# Patient Record
Sex: Female | Born: 1970
Health system: Southern US, Community
[De-identification: ages and names within clinical notes are randomized; demographics above are authoritative.]

## PROBLEM LIST (undated history)

## (undated) DIAGNOSIS — I2510 Atherosclerotic heart disease of native coronary artery without angina pectoris: Secondary | ICD-10-CM

## (undated) DIAGNOSIS — R112 Nausea with vomiting, unspecified: Secondary | ICD-10-CM

## (undated) DIAGNOSIS — F329 Major depressive disorder, single episode, unspecified: Secondary | ICD-10-CM

## (undated) DIAGNOSIS — G629 Polyneuropathy, unspecified: Secondary | ICD-10-CM

## (undated) DIAGNOSIS — K219 Gastro-esophageal reflux disease without esophagitis: Secondary | ICD-10-CM

## (undated) DIAGNOSIS — N879 Dysplasia of cervix uteri, unspecified: Secondary | ICD-10-CM

## (undated) DIAGNOSIS — F419 Anxiety disorder, unspecified: Secondary | ICD-10-CM

## (undated) DIAGNOSIS — E7800 Pure hypercholesterolemia, unspecified: Secondary | ICD-10-CM

## (undated) DIAGNOSIS — Z9889 Other specified postprocedural states: Secondary | ICD-10-CM

## (undated) DIAGNOSIS — I10 Essential (primary) hypertension: Secondary | ICD-10-CM

## (undated) DIAGNOSIS — G4730 Sleep apnea, unspecified: Secondary | ICD-10-CM

## (undated) DIAGNOSIS — N200 Calculus of kidney: Secondary | ICD-10-CM

## (undated) DIAGNOSIS — R0600 Dyspnea, unspecified: Secondary | ICD-10-CM

## (undated) DIAGNOSIS — M1990 Unspecified osteoarthritis, unspecified site: Secondary | ICD-10-CM

## (undated) DIAGNOSIS — Z87442 Personal history of urinary calculi: Secondary | ICD-10-CM

## (undated) DIAGNOSIS — F32A Depression, unspecified: Secondary | ICD-10-CM

## (undated) HISTORY — PX: CARDIAC CATHETERIZATION: SHX172

## (undated) HISTORY — PX: CHOLECYSTECTOMY: SHX55

## (undated) HISTORY — DX: Dyspnea, unspecified: R06.00

## (undated) HISTORY — PX: WRIST FRACTURE SURGERY: SHX121

## (undated) HISTORY — PX: INCISION AND DRAINAGE: SHX5863

## (undated) HISTORY — PX: ELBOW SURGERY: SHX618

## (undated) HISTORY — PX: WRIST SURGERY: SHX841

## (undated) HISTORY — PX: ENDOMETRIAL ABLATION: SHX621

## (undated) HISTORY — PX: ELBOW FRACTURE SURGERY: SHX616

---

## 1998-07-29 ENCOUNTER — Ambulatory Visit (HOSPITAL_COMMUNITY): Admission: RE | Admit: 1998-07-29 | Discharge: 1998-07-29 | Payer: Self-pay | Admitting: *Deleted

## 1998-08-02 ENCOUNTER — Encounter: Admission: RE | Admit: 1998-08-02 | Discharge: 1998-10-31 | Payer: Self-pay | Admitting: *Deleted

## 1998-08-22 ENCOUNTER — Inpatient Hospital Stay (HOSPITAL_COMMUNITY): Admission: AD | Admit: 1998-08-22 | Discharge: 1998-08-24 | Payer: Self-pay | Admitting: *Deleted

## 1998-10-09 ENCOUNTER — Other Ambulatory Visit: Admission: RE | Admit: 1998-10-09 | Discharge: 1998-10-09 | Payer: Self-pay | Admitting: *Deleted

## 1999-03-14 ENCOUNTER — Encounter: Payer: Self-pay | Admitting: Specialist

## 1999-03-14 ENCOUNTER — Ambulatory Visit (HOSPITAL_COMMUNITY): Admission: RE | Admit: 1999-03-14 | Discharge: 1999-03-14 | Payer: Self-pay | Admitting: Specialist

## 2003-04-26 ENCOUNTER — Encounter: Payer: Self-pay | Admitting: *Deleted

## 2003-04-26 ENCOUNTER — Inpatient Hospital Stay (HOSPITAL_COMMUNITY): Admission: EM | Admit: 2003-04-26 | Discharge: 2003-04-27 | Payer: Self-pay | Admitting: *Deleted

## 2003-04-27 ENCOUNTER — Encounter: Payer: Self-pay | Admitting: Internal Medicine

## 2003-05-09 ENCOUNTER — Encounter: Admission: RE | Admit: 2003-05-09 | Discharge: 2003-05-09 | Payer: Self-pay | Admitting: Internal Medicine

## 2003-05-15 ENCOUNTER — Encounter: Admission: RE | Admit: 2003-05-15 | Discharge: 2003-08-13 | Payer: Self-pay | Admitting: *Deleted

## 2003-05-23 ENCOUNTER — Encounter: Admission: RE | Admit: 2003-05-23 | Discharge: 2003-05-23 | Payer: Self-pay | Admitting: Internal Medicine

## 2003-06-27 ENCOUNTER — Encounter: Admission: RE | Admit: 2003-06-27 | Discharge: 2003-06-27 | Payer: Self-pay | Admitting: Internal Medicine

## 2003-08-03 ENCOUNTER — Encounter: Admission: RE | Admit: 2003-08-03 | Discharge: 2003-08-03 | Payer: Self-pay | Admitting: Internal Medicine

## 2003-09-06 ENCOUNTER — Encounter: Admission: RE | Admit: 2003-09-06 | Discharge: 2003-12-05 | Payer: Self-pay | Admitting: *Deleted

## 2003-10-22 ENCOUNTER — Encounter: Admission: RE | Admit: 2003-10-22 | Discharge: 2003-10-22 | Payer: Self-pay | Admitting: Internal Medicine

## 2003-11-05 ENCOUNTER — Encounter: Admission: RE | Admit: 2003-11-05 | Discharge: 2003-11-05 | Payer: Self-pay | Admitting: Internal Medicine

## 2003-12-22 ENCOUNTER — Emergency Department (HOSPITAL_COMMUNITY): Admission: EM | Admit: 2003-12-22 | Discharge: 2003-12-22 | Payer: Self-pay | Admitting: Emergency Medicine

## 2003-12-23 ENCOUNTER — Emergency Department (HOSPITAL_COMMUNITY): Admission: EM | Admit: 2003-12-23 | Discharge: 2003-12-23 | Payer: Self-pay | Admitting: Family Medicine

## 2003-12-27 ENCOUNTER — Encounter: Admission: RE | Admit: 2003-12-27 | Discharge: 2003-12-27 | Payer: Self-pay | Admitting: Internal Medicine

## 2004-01-03 ENCOUNTER — Encounter: Admission: RE | Admit: 2004-01-03 | Discharge: 2004-01-03 | Payer: Self-pay | Admitting: Internal Medicine

## 2004-01-07 ENCOUNTER — Ambulatory Visit (HOSPITAL_COMMUNITY): Admission: RE | Admit: 2004-01-07 | Discharge: 2004-01-07 | Payer: Self-pay | Admitting: Internal Medicine

## 2004-01-11 ENCOUNTER — Encounter: Admission: RE | Admit: 2004-01-11 | Discharge: 2004-01-11 | Payer: Self-pay | Admitting: Internal Medicine

## 2004-03-21 ENCOUNTER — Emergency Department (HOSPITAL_COMMUNITY): Admission: EM | Admit: 2004-03-21 | Discharge: 2004-03-21 | Payer: Self-pay | Admitting: Family Medicine

## 2004-03-28 ENCOUNTER — Encounter: Admission: RE | Admit: 2004-03-28 | Discharge: 2004-03-28 | Payer: Self-pay | Admitting: Internal Medicine

## 2004-04-29 ENCOUNTER — Encounter: Admission: RE | Admit: 2004-04-29 | Discharge: 2004-04-29 | Payer: Self-pay | Admitting: Internal Medicine

## 2004-07-30 ENCOUNTER — Emergency Department (HOSPITAL_COMMUNITY): Admission: EM | Admit: 2004-07-30 | Discharge: 2004-07-30 | Payer: Self-pay | Admitting: Family Medicine

## 2004-09-22 ENCOUNTER — Ambulatory Visit: Payer: Self-pay | Admitting: Internal Medicine

## 2004-09-24 ENCOUNTER — Ambulatory Visit: Payer: Self-pay | Admitting: Internal Medicine

## 2004-09-24 ENCOUNTER — Ambulatory Visit (HOSPITAL_BASED_OUTPATIENT_CLINIC_OR_DEPARTMENT_OTHER): Admission: RE | Admit: 2004-09-24 | Discharge: 2004-09-24 | Payer: Self-pay | Admitting: Internal Medicine

## 2004-10-21 ENCOUNTER — Ambulatory Visit: Payer: Self-pay | Admitting: Internal Medicine

## 2004-10-22 ENCOUNTER — Ambulatory Visit: Payer: Self-pay | Admitting: Internal Medicine

## 2004-11-04 ENCOUNTER — Ambulatory Visit (HOSPITAL_COMMUNITY): Admission: RE | Admit: 2004-11-04 | Discharge: 2004-11-04 | Payer: Self-pay | Admitting: Internal Medicine

## 2004-11-06 ENCOUNTER — Emergency Department (HOSPITAL_COMMUNITY): Admission: EM | Admit: 2004-11-06 | Discharge: 2004-11-06 | Payer: Self-pay | Admitting: Family Medicine

## 2004-11-27 ENCOUNTER — Emergency Department (HOSPITAL_COMMUNITY): Admission: EM | Admit: 2004-11-27 | Discharge: 2004-11-27 | Payer: Self-pay | Admitting: Family Medicine

## 2005-01-22 ENCOUNTER — Ambulatory Visit: Payer: Self-pay | Admitting: Internal Medicine

## 2005-02-06 ENCOUNTER — Inpatient Hospital Stay (HOSPITAL_COMMUNITY): Admission: AD | Admit: 2005-02-06 | Discharge: 2005-02-06 | Payer: Self-pay | Admitting: Obstetrics & Gynecology

## 2005-04-28 ENCOUNTER — Emergency Department (HOSPITAL_COMMUNITY): Admission: EM | Admit: 2005-04-28 | Discharge: 2005-04-29 | Payer: Self-pay | Admitting: Emergency Medicine

## 2005-05-08 ENCOUNTER — Ambulatory Visit: Payer: Self-pay | Admitting: Internal Medicine

## 2005-05-11 ENCOUNTER — Emergency Department (HOSPITAL_COMMUNITY): Admission: EM | Admit: 2005-05-11 | Discharge: 2005-05-11 | Payer: Self-pay | Admitting: Emergency Medicine

## 2005-05-14 ENCOUNTER — Ambulatory Visit: Payer: Self-pay | Admitting: Internal Medicine

## 2005-05-14 ENCOUNTER — Ambulatory Visit (HOSPITAL_COMMUNITY): Admission: RE | Admit: 2005-05-14 | Discharge: 2005-05-14 | Payer: Self-pay | Admitting: Internal Medicine

## 2005-05-18 ENCOUNTER — Ambulatory Visit: Payer: Self-pay | Admitting: Internal Medicine

## 2005-05-22 ENCOUNTER — Ambulatory Visit: Payer: Self-pay | Admitting: Internal Medicine

## 2006-05-05 ENCOUNTER — Emergency Department (HOSPITAL_COMMUNITY): Admission: EM | Admit: 2006-05-05 | Discharge: 2006-05-05 | Payer: Self-pay | Admitting: Emergency Medicine

## 2006-07-27 ENCOUNTER — Ambulatory Visit: Payer: Self-pay | Admitting: Cardiology

## 2006-07-27 ENCOUNTER — Emergency Department (HOSPITAL_COMMUNITY): Admission: EM | Admit: 2006-07-27 | Discharge: 2006-07-27 | Payer: Self-pay | Admitting: Emergency Medicine

## 2006-07-29 ENCOUNTER — Inpatient Hospital Stay (HOSPITAL_BASED_OUTPATIENT_CLINIC_OR_DEPARTMENT_OTHER): Admission: RE | Admit: 2006-07-29 | Discharge: 2006-07-29 | Payer: Self-pay | Admitting: Cardiovascular Disease

## 2006-07-29 ENCOUNTER — Ambulatory Visit: Payer: Self-pay | Admitting: Cardiovascular Disease

## 2006-08-03 ENCOUNTER — Ambulatory Visit: Payer: Self-pay | Admitting: Internal Medicine

## 2006-08-03 ENCOUNTER — Ambulatory Visit: Payer: Self-pay

## 2006-08-05 ENCOUNTER — Ambulatory Visit: Payer: Self-pay

## 2006-10-01 ENCOUNTER — Ambulatory Visit (HOSPITAL_COMMUNITY): Admission: RE | Admit: 2006-10-01 | Discharge: 2006-10-01 | Payer: Self-pay | Admitting: Obstetrics and Gynecology

## 2006-10-01 ENCOUNTER — Encounter (INDEPENDENT_AMBULATORY_CARE_PROVIDER_SITE_OTHER): Payer: Self-pay | Admitting: Specialist

## 2006-11-08 ENCOUNTER — Ambulatory Visit (HOSPITAL_COMMUNITY): Admission: RE | Admit: 2006-11-08 | Discharge: 2006-11-08 | Payer: Self-pay | Admitting: Obstetrics and Gynecology

## 2007-03-10 ENCOUNTER — Ambulatory Visit (HOSPITAL_COMMUNITY): Admission: RE | Admit: 2007-03-10 | Discharge: 2007-03-10 | Payer: Self-pay | Admitting: General Surgery

## 2007-05-23 ENCOUNTER — Ambulatory Visit (HOSPITAL_COMMUNITY): Admission: RE | Admit: 2007-05-23 | Discharge: 2007-05-23 | Payer: Self-pay | Admitting: Obstetrics and Gynecology

## 2007-12-04 ENCOUNTER — Emergency Department (HOSPITAL_COMMUNITY): Admission: EM | Admit: 2007-12-04 | Discharge: 2007-12-04 | Payer: Self-pay | Admitting: Family Medicine

## 2007-12-06 ENCOUNTER — Emergency Department (HOSPITAL_COMMUNITY): Admission: EM | Admit: 2007-12-06 | Discharge: 2007-12-06 | Payer: Self-pay | Admitting: Emergency Medicine

## 2007-12-08 ENCOUNTER — Emergency Department (HOSPITAL_COMMUNITY): Admission: EM | Admit: 2007-12-08 | Discharge: 2007-12-08 | Payer: Self-pay | Admitting: Emergency Medicine

## 2008-01-26 ENCOUNTER — Emergency Department (HOSPITAL_COMMUNITY): Admission: EM | Admit: 2008-01-26 | Discharge: 2008-01-26 | Payer: Self-pay | Admitting: Emergency Medicine

## 2008-03-10 ENCOUNTER — Emergency Department (HOSPITAL_COMMUNITY): Admission: EM | Admit: 2008-03-10 | Discharge: 2008-03-10 | Payer: Self-pay | Admitting: Family Medicine

## 2008-05-19 ENCOUNTER — Emergency Department (HOSPITAL_COMMUNITY): Admission: EM | Admit: 2008-05-19 | Discharge: 2008-05-19 | Payer: Self-pay | Admitting: Family Medicine

## 2008-06-04 ENCOUNTER — Emergency Department (HOSPITAL_COMMUNITY): Admission: EM | Admit: 2008-06-04 | Discharge: 2008-06-04 | Payer: Self-pay | Admitting: Family Medicine

## 2008-12-11 ENCOUNTER — Emergency Department (HOSPITAL_COMMUNITY): Admission: EM | Admit: 2008-12-11 | Discharge: 2008-12-11 | Payer: Self-pay | Admitting: Family Medicine

## 2009-08-20 ENCOUNTER — Emergency Department (HOSPITAL_COMMUNITY): Admission: EM | Admit: 2009-08-20 | Discharge: 2009-08-20 | Payer: Self-pay | Admitting: Emergency Medicine

## 2009-08-23 ENCOUNTER — Emergency Department (HOSPITAL_COMMUNITY): Admission: EM | Admit: 2009-08-23 | Discharge: 2009-08-24 | Payer: Self-pay | Admitting: Emergency Medicine

## 2010-04-15 ENCOUNTER — Observation Stay (HOSPITAL_COMMUNITY): Admission: EM | Admit: 2010-04-15 | Discharge: 2010-04-15 | Payer: Self-pay | Admitting: Emergency Medicine

## 2010-04-15 ENCOUNTER — Emergency Department (HOSPITAL_COMMUNITY): Admission: EM | Admit: 2010-04-15 | Discharge: 2010-04-15 | Payer: Self-pay | Admitting: Family Medicine

## 2010-04-23 ENCOUNTER — Emergency Department (HOSPITAL_COMMUNITY): Admission: EM | Admit: 2010-04-23 | Discharge: 2010-04-23 | Payer: Self-pay | Admitting: Family Medicine

## 2010-05-07 ENCOUNTER — Ambulatory Visit: Payer: Self-pay | Admitting: Internal Medicine

## 2010-05-13 ENCOUNTER — Ambulatory Visit (HOSPITAL_COMMUNITY): Admission: RE | Admit: 2010-05-13 | Discharge: 2010-05-13 | Payer: Self-pay | Admitting: Family Medicine

## 2010-06-02 ENCOUNTER — Encounter (INDEPENDENT_AMBULATORY_CARE_PROVIDER_SITE_OTHER): Payer: Self-pay | Admitting: *Deleted

## 2010-06-02 LAB — CONVERTED CEMR LAB
ALT: 15 units/L (ref 0–35)
AST: 12 units/L (ref 0–37)
Albumin: 4.2 g/dL (ref 3.5–5.2)
Alkaline Phosphatase: 64 units/L (ref 39–117)
BUN: 20 mg/dL (ref 6–23)
CO2: 24 meq/L (ref 19–32)
Calcium: 9.3 mg/dL (ref 8.4–10.5)
Chloride: 104 meq/L (ref 96–112)
Cholesterol: 214 mg/dL — ABNORMAL HIGH (ref 0–200)
Creatinine, Ser: 0.99 mg/dL (ref 0.40–1.20)
Glucose, Bld: 162 mg/dL — ABNORMAL HIGH (ref 70–99)
HDL: 46 mg/dL (ref >39)
LDL Cholesterol: 126 mg/dL — ABNORMAL HIGH (ref 0–99)
Microalb, Ur: 0.96 mg/dL (ref 0.00–1.89)
Potassium: 4.7 meq/L (ref 3.5–5.3)
Sodium: 139 meq/L (ref 135–145)
Total Bilirubin: 0.3 mg/dL (ref 0.3–1.2)
Total CHOL/HDL Ratio: 4.7
Total Protein: 6.6 g/dL (ref 6.0–8.3)
Triglycerides: 208 mg/dL — ABNORMAL HIGH (ref <150)
VLDL: 42 mg/dL — ABNORMAL HIGH (ref 0–40)

## 2010-09-21 ENCOUNTER — Encounter: Payer: Self-pay | Admitting: Cardiovascular Disease

## 2010-10-28 ENCOUNTER — Inpatient Hospital Stay (HOSPITAL_COMMUNITY)
Admission: EM | Admit: 2010-10-28 | Discharge: 2010-10-31 | DRG: 313 | Disposition: A | Discharge status: Home / Self Care | Payer: Medicaid Other | Attending: Internal Medicine | Admitting: Internal Medicine

## 2010-10-28 ENCOUNTER — Emergency Department (HOSPITAL_COMMUNITY): Payer: Medicaid Other

## 2010-10-28 DIAGNOSIS — Z8249 Family history of ischemic heart disease and other diseases of the circulatory system: Secondary | ICD-10-CM

## 2010-10-28 DIAGNOSIS — E669 Obesity, unspecified: Secondary | ICD-10-CM | POA: Diagnosis present

## 2010-10-28 DIAGNOSIS — E119 Type 2 diabetes mellitus without complications: Secondary | ICD-10-CM | POA: Diagnosis present

## 2010-10-28 DIAGNOSIS — E785 Hyperlipidemia, unspecified: Secondary | ICD-10-CM | POA: Diagnosis present

## 2010-10-28 DIAGNOSIS — R0789 Other chest pain: Principal | ICD-10-CM | POA: Diagnosis present

## 2010-10-28 DIAGNOSIS — F17200 Nicotine dependence, unspecified, uncomplicated: Secondary | ICD-10-CM | POA: Diagnosis present

## 2010-10-28 DIAGNOSIS — I10 Essential (primary) hypertension: Secondary | ICD-10-CM | POA: Diagnosis present

## 2010-10-28 LAB — CBC
HCT: 43.8 % (ref 36.0–46.0)
Hemoglobin: 15 g/dL (ref 12.0–15.0)
MCH: 30 pg (ref 26.0–34.0)
MCHC: 34.2 g/dL (ref 30.0–36.0)
MCV: 87.6 fL (ref 78.0–100.0)
Platelets: 209 10*3/uL (ref 150–400)
RBC: 5 MIL/uL (ref 3.87–5.11)
RDW: 12.2 % (ref 11.5–15.5)
WBC: 13.9 10*3/uL — ABNORMAL HIGH (ref 4.0–10.5)

## 2010-10-28 LAB — GLUCOSE, CAPILLARY
Glucose-Capillary: 63 mg/dL — ABNORMAL LOW (ref 70–99)
Glucose-Capillary: 149 mg/dL — ABNORMAL HIGH (ref 70–99)

## 2010-10-28 LAB — DIFFERENTIAL
Basophils Absolute: 0 10*3/uL (ref 0.0–0.1)
Basophils Relative: 0 % (ref 0–1)
Eosinophils Absolute: 0 10*3/uL (ref 0.0–0.7)
Eosinophils Relative: 0 % (ref 0–5)
Lymphocytes Relative: 22 % (ref 12–46)
Lymphs Abs: 3.1 10*3/uL (ref 0.7–4.0)
Monocytes Absolute: 0.7 10*3/uL (ref 0.1–1.0)
Monocytes Relative: 5 % (ref 3–12)
Neutro Abs: 10 10*3/uL — ABNORMAL HIGH (ref 1.7–7.7)
Neutrophils Relative %: 72 % (ref 43–77)

## 2010-10-28 LAB — CARDIAC PANEL(CRET KIN+CKTOT+MB+TROPI)
CK, MB: 2.2 ng/mL (ref 0.3–4.0)
Relative Index: INVALID (ref 0.0–2.5)
Total CK: 79 U/L (ref 7–177)
Troponin I: 0.01 ng/mL (ref 0.00–0.06)

## 2010-10-28 LAB — COMPREHENSIVE METABOLIC PANEL
ALT: 23 U/L (ref 0–35)
AST: 19 U/L (ref 0–37)
Albumin: 3.9 g/dL (ref 3.5–5.2)
Alkaline Phosphatase: 51 U/L (ref 39–117)
BUN: 13 mg/dL (ref 6–23)
CO2: 27 mEq/L (ref 19–32)
Calcium: 9.1 mg/dL (ref 8.4–10.5)
Chloride: 106 mEq/L (ref 96–112)
Creatinine, Ser: 0.78 mg/dL (ref 0.4–1.2)
GFR calc Af Amer: >60 mL/min (ref >60)
GFR calc non Af Amer: >60 mL/min (ref >60)
Glucose, Bld: 72 mg/dL (ref 70–99)
Potassium: 3.7 mEq/L (ref 3.5–5.1)
Sodium: 140 mEq/L (ref 135–145)
Total Bilirubin: 0.4 mg/dL (ref 0.3–1.2)
Total Protein: 7.2 g/dL (ref 6.0–8.3)

## 2010-10-28 LAB — POCT CARDIAC MARKERS
CKMB, poc: 2.1 ng/mL (ref 1.0–8.0)
CKMB, poc: 2.1 ng/mL (ref 1.0–8.0)
Myoglobin, poc: 58.7 ng/mL (ref 12–200)
Myoglobin, poc: 60.1 ng/mL (ref 12–200)
Troponin i, poc: <0.05 ng/mL (ref 0.00–0.09)
Troponin i, poc: 0.06 ng/mL (ref 0.00–0.09)

## 2010-10-29 DIAGNOSIS — R079 Chest pain, unspecified: Secondary | ICD-10-CM

## 2010-10-29 LAB — GLUCOSE, CAPILLARY
Glucose-Capillary: 153 mg/dL — ABNORMAL HIGH (ref 70–99)
Glucose-Capillary: 123 mg/dL — ABNORMAL HIGH (ref 70–99)
Glucose-Capillary: 182 mg/dL — ABNORMAL HIGH (ref 70–99)

## 2010-10-29 LAB — LIPID PANEL
Cholesterol: 231 mg/dL — ABNORMAL HIGH (ref 0–200)
HDL: 37 mg/dL — ABNORMAL LOW (ref >39)
LDL Cholesterol: 125 mg/dL — ABNORMAL HIGH (ref 0–99)
Total CHOL/HDL Ratio: 6.2 RATIO
Triglycerides: 346 mg/dL — ABNORMAL HIGH (ref <150)
VLDL: 69 mg/dL — ABNORMAL HIGH (ref 0–40)

## 2010-10-29 LAB — BASIC METABOLIC PANEL
BUN: 15 mg/dL (ref 6–23)
CO2: 28 mEq/L (ref 19–32)
Calcium: 9.1 mg/dL (ref 8.4–10.5)
Chloride: 103 mEq/L (ref 96–112)
Creatinine, Ser: 0.72 mg/dL (ref 0.4–1.2)
GFR calc Af Amer: >60 mL/min (ref >60)
GFR calc non Af Amer: >60 mL/min (ref >60)
Glucose, Bld: 132 mg/dL — ABNORMAL HIGH (ref 70–99)
Potassium: 4 mEq/L (ref 3.5–5.1)
Sodium: 137 mEq/L (ref 135–145)

## 2010-10-29 LAB — CARDIAC PANEL(CRET KIN+CKTOT+MB+TROPI)
CK, MB: 2 ng/mL (ref 0.3–4.0)
CK, MB: 2 ng/mL (ref 0.3–4.0)
Relative Index: INVALID (ref 0.0–2.5)
Relative Index: INVALID (ref 0.0–2.5)
Total CK: 80 U/L (ref 7–177)
Total CK: 64 U/L (ref 7–177)
Troponin I: 0.01 ng/mL (ref 0.00–0.06)
Troponin I: 0.01 ng/mL (ref 0.00–0.06)

## 2010-10-29 LAB — CBC
HCT: 39.1 % (ref 36.0–46.0)
Hemoglobin: 13.4 g/dL (ref 12.0–15.0)
MCH: 29.7 pg (ref 26.0–34.0)
MCHC: 34.3 g/dL (ref 30.0–36.0)
MCV: 86.7 fL (ref 78.0–100.0)
Platelets: 191 10*3/uL (ref 150–400)
RBC: 4.51 MIL/uL (ref 3.87–5.11)
RDW: 11.9 % (ref 11.5–15.5)
WBC: 9.4 10*3/uL (ref 4.0–10.5)

## 2010-10-29 LAB — HEMOGLOBIN A1C
Hgb A1c MFr Bld: 6.4 % — ABNORMAL HIGH (ref <5.7)
Mean Plasma Glucose: 137 mg/dL — ABNORMAL HIGH (ref <117)

## 2010-10-29 LAB — MAGNESIUM: Magnesium: 1.7 mg/dL (ref 1.5–2.5)

## 2010-10-29 NOTE — H&P (Signed)
[name], [name] NO.:  [id number]  MEDICAL RECORD NO.:  [id number]           PATIENT TYPE:  E  LOCATION:  WLED                         FACILITY:  [facility]  PHYSICIAN:  Vania Rea, M.D. DATE OF BIRTH:  1970-11-20  DATE OF ADMISSION:  10/28/2010 DATE OF DISCHARGE:                             HISTORY & PHYSICAL   PRIMARY CARE PHYSICIAN:  [name] at [facility].  C[facility]OLOGIST[facility]or[facility]iology.  CHIEF COMPLAINT:  Chest pain since this morning.  HISTORY OF PRESENT ILLNESS:  This is a 40 year old Caucasian lady with a history of diabetes, hypertension and strong family history of early coronary artery disease, who has a history of episodic chest pains for the past few years.  The patient did have a cardiac catheterization by the [facility] in 2007 and was shown to have no evidence of obstruction; however, she did have an aberrant circumflex.  A CT of other coronary arteries was recommended, but there is no evidence that this was done.  The patient reportedly was placed on metoprolol and isosorbide.  However, after she lost her health insurance, she discontinued the use of these medications, and since she has been at [facility], she could not remember the names of the medications.  The patient has also seen [facility] in the past and says she would prefer to have her care continued by [facility] if necessary.  In any event, the patient had sudden onset of right chest pain today radiating up into the right side of her neck and down into her left arm. The pain was associated with shortness of breath, diaphoresis and dizziness, but she was able to drive herself to the emergency room where she received nitro paste, which she reports did not improve her symptoms significantly.  She reports that the pain is actually aggravated by sitting up and relieved by lying flat.  She has been having no orthopnea, no PND, no lower extremity edema and no  nausea or vomiting. She usually checks her blood sugars in the evening.  When she has episodes of low blood sugar, it usually causes nausea and dizziness. When she arrived in the emergency room, her blood sugar was checked and it was 63.  She does not think her symptoms are related to hypoglycemia. She is currently having supper.  PAST MEDICAL HISTORY: 1. Hypertension. 2. Diabetes. 3. History of recurrent chest pains and irregular heart rate.  MEDICATIONS:  Medication reconciliation has not yet been done, but includes metformin 1000 twice a day, glipizide 10 mg twice daily, lisinopril 20 mg once a day.  ALLERGIES:  CODEINE and SULFA.  SOCIAL HISTORY:  She has been a smoker for 22 years.  She used to smoke 2 packs per day.  She is now down to 1 pack per day.  Denies alcohol or illicit drug use.  FAMILY HISTORY:  Significant for a sister with coronary artery disease who had CABG at age 34.  Significant for her father who had multiple heart attacks and also had CABG.  REVIEW OF SYSTEMS:  Other than noted above, significant only for the fact that she was having stiffness  in her neck since yesterday and it has now become much worse with her current chest pain.  She says she has had her hemoglobin A1c checked a few months ago and it was about 6.5.  PAST SURGICAL HISTORY:  Includes endometrial ablation and cholecystectomy.  PHYSICAL EXAMINATION:  GENERAL:  This is a very pleasant middle-aged Caucasian lady sitting in the bed. VITAL SIGNS:  Temperature is 97.9, pulse 73, respirations 18, blood pressure 127/66.  She is saturating 100% on 2 liters. HEENT:  Her pupils are round, equal, reactive.  Mucous membranes pink. Anicteric.  No cervical lymphadenopathy or thyromegaly.  She is somewhat tender over the sternomastoid but no carotid bruit. CHEST:  Clear to auscultation bilaterally. CARDIOVASCULAR:  Regular rhythm.  No murmur. ABDOMEN:  Obese, soft, nontender. EXTREMITIES:  Without  edema.  She has 2+ dorsalis pedis pulses bilaterally. SKIN:  Warm and dry.  No ulcerations. CENTRAL NERVOUS SYSTEM:  Cranial nerves II-XII are grossly intact.  She has no focal lateralizing signs.  PERTINENT LABORATORY AND X-RAY DATA:  Her white count is elevated to 13.9.  Her hemoglobin is 15, platelets 209.  Absolute neutrophil count is elevated to 10,000.  There is no comment on a differential.  Complete metabolic panel is completely normal with sodium of 140, potassium 3.7, CO2 of 27, glucose is 72, BUN 13, creatinine 0.78.  Her calcium is 9.1. Liver functions are completely normal.  Her 2 sets of cardiac markers have been done.  On the first, her troponins were undetectable; on the second, the troponin 0.06.  CK-MB on the first was 2.1; it is unchanged on the second.  Myoglobin is completely normal at 60.  Portable chest x- ray shows no acute abnormalities and EKG shows normal sinus rhythm without any ST segment abnormalities.  ASSESSMENT: 1. Atypical chest pain in a lady with a strong family history of     cardiac disease. 2. Hypertension. 3. Diabetes type 2. 4. Obesity.  PLAN:  Will bring this lady on observation for serial cardiac enzymes and will consult her cardiologist for assistance with management.  Other plans as per orders.     Vania Rea, M.D.     LC/MEDQ  D:  10/28/2010  T:  10/28/2010  Job:  [id number]  cc:   Gay Filler  Electronically Signed by Vania Rea M.D. on 10/29/2010 04:54:09 AM

## 2010-10-30 DIAGNOSIS — R072 Precordial pain: Secondary | ICD-10-CM

## 2010-10-30 LAB — GLUCOSE, CAPILLARY
Glucose-Capillary: 150 mg/dL — ABNORMAL HIGH (ref 70–99)
Glucose-Capillary: 164 mg/dL — ABNORMAL HIGH (ref 70–99)
Glucose-Capillary: 148 mg/dL — ABNORMAL HIGH (ref 70–99)
Glucose-Capillary: 154 mg/dL — ABNORMAL HIGH (ref 70–99)
Glucose-Capillary: 133 mg/dL — ABNORMAL HIGH (ref 70–99)

## 2010-10-31 ENCOUNTER — Inpatient Hospital Stay (HOSPITAL_COMMUNITY): Payer: Medicaid Other

## 2010-10-31 LAB — GLUCOSE, CAPILLARY: Glucose-Capillary: 214 mg/dL — ABNORMAL HIGH (ref 70–99)

## 2010-10-31 MED ORDER — TECHNETIUM TC 99M TETROFOSMIN IV KIT
10.0000 | PACK | Freq: Once | INTRAVENOUS | Status: AC | PRN
  Administered 2010-10-31: 10 via INTRAVENOUS

## 2010-10-31 MED ORDER — TECHNETIUM TC 99M TETROFOSMIN IV KIT
30.0000 | PACK | Freq: Once | INTRAVENOUS | Status: AC | PRN
Start: 2010-10-31 — End: 2010-10-31
  Administered 2010-10-31: 30 via INTRAVENOUS

## 2010-11-01 ENCOUNTER — Other Ambulatory Visit (HOSPITAL_COMMUNITY): Payer: Self-pay

## 2010-11-06 NOTE — Discharge Summary (Signed)
[name], [name]              ACCOUNT NO.:  [id number]  MEDICAL RECORD NO.:  [id number]           PATIENT TYPE:  I  LOCATION:  1431                         FACILITY:  [facility]  PHYSICIAN:  [name], M.D.   DATE OF BIRTH:  1971-01-09  DATE OF ADMISSION:  10/28/2010 DATE OF DISCHARGE:  10/31/2010                              DISCHARGE SUMMARY   PRIMARY CARE PHYSICIAN:  Dr. Andrey [name] at [facility].  DISCHARGE DIAGNOSES: 1. Noncardiac chest pain. 2. Dyslipidemia. 3. Hypertension. 4. Type 2 diabetes. 5. Obesity. 6. Tobacco abuse.  DISCHARGE MEDICATIONS: 1. Aspirin 81 mg p.o. daily. 2. Flexeril 10 mg p.o. q.8 h. p.r.n. muscle pain or spasms. 3. Ibuprofen 800 mg p.o. q.8 h. p.r.n. chest pain. 4. Nicotine patch 21 mg transdermally daily for 2 weeks, then taper by     7 mg every 2 weeks until tapered off. 5. Simvastatin 20 mg p.o. daily. 6. Glipizide 10 mg p.o. b.i.d. 7. Lisinopril 20 mg p.o. daily. 8. Metformin 1000 mg p.o. b.i.d. 9. Wellbutrin 150 mg p.o. b.i.d.  CONSULTATIONS:  [name], M.D., of Cardiology.  BRIEF ADMISSION HISTORY OF PRESENT ILLNESS:  The patient is a 40 year old female with multiple cardiac risk factors, who presented to the hospital with a chief complaint of right chest wall pain.  The pain was worse with sitting up and relieved with lying flat.  Because of her risk factor profile, she was referred to the Hospitalist Service for inpatient evaluation.  For full details, please see the dictated report done by [name].  PROCEDURES AND DIAGNOSTIC STUDIES: 1. Chest x-ray on October 28, 2010, showed no active lung disease. 2. Two-dimensional echocardiogram on October 30, 2010, showed normal     systolic function with an estimated ejection fraction of 50% to     55%.  There was grade 1 diastolic dysfunction. 3. Treadmill stress test done on October 31, 2010:  Normal study.  DISCHARGE LABORATORY VALUES:  Cardiac markers were negative x3  sets. Lipids showed a cholesterol of 231, triglycerides 346, HDL 37, LDL 125. Sodium is 137, potassium 4.0, chloride 103, bicarb 28, BUN 15, creatinine 0.72, glucose 132, calcium 9.1.  White blood cell count was 9.4, hemoglobin 13.1, hematocrit 39.1, platelets 191.  Hemoglobin A1c was 6.4%.  Liver function studies were within normal limits.  HOSPITAL COURSE BY PROBLEM: 1. Noncardiac chest pain:  The patient was admitted and because of her     pain exacerbation was sitting straight upright, a 2-dimensional     echocardiogram was done to rule out pericarditis.  Because of her     risk factor profile, cardiology consultation was requested and     kindly provided by [name].  The patient did undergo a     treadmill stress test which was negative and at this point, her     chest pain appears to be musculoskeletal in origin.  She will be     treated symptomatically with Motrin and Flexeril and has been     advised to follow up with her primary care physician should her     symptoms not resolve in the next  1-2 weeks. 2. Dyslipidemia:  The patient should be on aggressive risk factor     modification given her heart disease risk factors.  She has been     put on statin therapy and has been encouraged to follow up with Dr.     Andrey [name] in 4-6 weeks for recheck of her liver function studies. 3. Hypertension:  The patient's blood pressure is well controlled on     lisinopril. 4. Type 2 diabetes:  The patient is well controlled on her outpatient     regimen. 5. Obesity:  Extensive counseling done with the patient regarding diet     and exercise. 6. Tobacco abuse:  The patient was counseled and wishes to try     Wellbutrin. The patient was also maintained on a nicotine patch.     She has been instructed to taper the patch, to get her prescription     for Wellbutrin filled and to discontinue smoking.  DISPOSITION:  The patient is medically stable and will be discharged home.  Time spent on  discharge including face-to-face time in counseling, smoking cessation equals 40 minutes.     [name], M.D.     CR/MEDQ  D:  10/31/2010  T:  10/31/2010  Job:  [id number]  cc:   Dr. Gay Filler  Electronically Signed by [name] M.D. on 11/04/2010 07:43:20 PM

## 2010-11-07 NOTE — Consult Note (Signed)
[name], Meghan NO.:  [id number]  MEDICAL RECORD NO.:  [id number]           PATIENT TYPE:  I  LOCATION:  1431                         FACILITY:  [facility]  PHYSICIAN:  [name], M.D.  DATE OF BIRTH:  14-Nov-1970  DATE OF CONSULTATION:  10/29/2010 DATE OF DISCHARGE:                                CONSULTATION   PRIMARY CARDIOLOGIST:  Most recent cardiologist was Dr. Reyes Meghan, who is  no longer in [place].  She had a catheterization by Dr. Excell Deleon in 2007 but at this stage, she would not like to follow up with Dr. Excell Deleon any longer.  PRIMARY CARE PROVIDER:  Dr. Andrey Deleon at [facility].  REASON FOR CONSULTATION:  Chest pain.  REFERRING PHYSICIAN:  Hillery Deleon, M.D.  PAST MEDICAL HISTORY: 1. 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     a.     Per cardiac catheterization on July 29, 2006.     b.     Normal left ventricular function. 2. Non-insulin-dependent diabetes mellitus. 3. Hypertension. 4. Dyslipidemia. 5. History of palpitations. 6. Status post cholecystectomy at the age of 31. 7. Endometrial ablation.  HISTORY OF PRESENT ILLNESS:  This is a 40 year old female with the above- stated problem list as well as a strong family history of early coronary artery disease.  A CT angiogram was recommended post catheterization to better visualize her left circumflex, although there was no suspicion of disease in the vessel.  The patient's mother was going to an aneurysm at this time and per office records, she never had this completed.  At that time, the patient was also on metoprolol and isosorbide.  She has since that time lost her health insurance and discontinued her heart medications as well as her diabetic medications.  She states she has been doing well.  She has begun to see a physician at [facility] in August of 2011 and has been on oral hyperglycemic as well as  lisinopril. On the day of admission, the patient had a sudden onset of right chest pain.  She states it was pressure type that radiated to her left side and down her left arm.  After the pain started, she began to have fainting sensation as well as was diaphoretic.  With the patient's history of cardiac catheterization and family history, she presented to the emergency department for further evaluation.  The patient states when she was waiting for a room, she leaned over a chair, and the pain relieved itself.  She then lie down and her pain was completely resolved.  This was prior to receiving nitroglycerin paste and aspirin. Now, she states that throughout the evening, she only has the chest pressure when she sits up for a while.  She is able to ambulate to the bathroom and back to her bed without complaints of pain.  She denies worsening pain with deep inspiration.  She does state that her right chest is tender to palpation.  The patient's EKG shows normal sinus rhythm without acute changes.  She has ruled out for myocardial infarction with cardiac enzymes  being negative x3.  With the patient's family history, Cardiology was consulted for further evaluation.  The patient denies any orthopnea, PND, or lower extremity edema.  She does have a 63 year old son who is disabled and therefore she has to take care of him completely.  She is able to lift him, approximately 100 pounds to bathe him without difficulty or chest pain.  She states she does not regularly exercise, but does do housework and shopping without difficulty.  Upon evaluation, the patient is currently chest pain-free.  SOCIAL HISTORY:  The patient lives in [place] with her son.  She has a 40-pack-year-smoking history.  She continues to smoke 1 pack a day. She denies any alcohol or illicit drug use.  FAMILY HISTORY:  Positive for early coronary artery disease.  Her father had quadruple bypass at age of 108.  She has 1  sister with a single- vessel bypass at age of 66.  She has a mother with an aneurysm.  ALLERGIES: 1. CODEINE. 2. SULFA.  INPATIENT MEDICATIONS: 1. Aspirin 81 mg daily. 2. Lovenox 50 mg subcutaneously daily. 3. NovoLog signs scale insulin. 4. Lisinopril 20 mg daily. 5. Nicotine patch. 6. Zocor 20 daily.  REVIEW OF SYSTEMS:  All pertinent positives and negatives as stated in the HPI as well as no palpitations.  All other systems have been reviewed and are negative.  PHYSICAL EXAMINATION:  VITAL SIGNS:  Temperature 97.9, pulse 75, respirations 18, blood pressure 102/69, O2 saturations 97% on room air. GENERAL:  This is a well-developed, well-nourished female.  She is in no acute distress.  She is eating peanut butter and crackers at this time. HEENT:  Normal. NECK:  Supple without bruit or JVD. HEART:  Regular rate and rhythm with S1, S2.  No murmur, rubs noted. PMI is normal.  Right lower sternal border tender to palpation. LUNGS:  Clear to auscultation bilaterally without wheezes, rales, or rhonchi. ABDOMEN:  Soft, nontender, positive bowel sounds x4. EXTREMITIES:  No clubbing, cyanosis, or edema noted. MUSCULOSKELETAL:  No joint deformities or effusions. NEURO:  Alert and oriented x3, cranial nerves II  through XII grossly intact.  IMAGING:  Chest x-ray showing no active cardiopulmonary disease.  EKG showing normal sinus rhythm at a rate of 92 beats per minute.  There are no acute ST-T-wave changes.  Axis is normal.  Intervals are normal.  LABORATORY DATA:  WBC is 9.4, hemoglobin 13.4, hematocrit 39.1, platelets 191.  Sodium 137, potassium 4, chloride 103, bicarb 28, BUN 15, creatinine 0.72.  Cardiac enzymes negative x3, hemoglobin A1c 6.4, total cholesterol 231, triglycerides 346, HDL 37, LDL 125.  ASSESSMENT AND PLAN:  This is a 40 year old female with multiple cardiac risk factors that include diabetes mellitus, hyperlipidemia, hypertension and positive family history  as well as tobacco abuse who presents with prolonged chest pain that is worse with sitting up.  The patient did have a negative Myoview in 2004 with nonobstructive coronary artery disease in 2009.  On exam, she does demonstrate right parasternal tenderness to palpation.  Her EKG is without acute changes and cardiac enzymes were negative x3.  We suspect the patient's chest pain is musculoskeletal, but given her multiple risk factors, we will schedule for a stress Myoview in the morning.  An echo has been ordered and this is currently pending.  These results will be reviewed when available. If the patient's stress test and echo are without concern, then the patient can be discharged without further ischemic evaluation.  Thank you for this  evaluation.  We will continue to follow along with you.     Leonette Monarch, PA-C   ______________________________ Sabas Frett M. [place], M.D.    NB/MEDQ  D:  10/29/2010  T:  10/30/2010  Job:  [id number]  Electronically Signed by Alen Blew P.A. on 11/01/2010 04:50:55 PM Electronically Signed by Bartolo Montanye [place] M.D. on 11/07/2010 11:18:32 AM

## 2010-11-13 LAB — COMPREHENSIVE METABOLIC PANEL
ALT: 20 U/L (ref 0–35)
AST: 17 U/L (ref 0–37)
Albumin: 3.6 g/dL (ref 3.5–5.2)
Alkaline Phosphatase: 107 U/L (ref 39–117)
BUN: 9 mg/dL (ref 6–23)
CO2: 29 mEq/L (ref 19–32)
Calcium: 9.7 mg/dL (ref 8.4–10.5)
Chloride: 101 mEq/L (ref 96–112)
Creatinine, Ser: 0.68 mg/dL (ref 0.4–1.2)
GFR calc Af Amer: >60 mL/min (ref >60)
GFR calc non Af Amer: >60 mL/min (ref >60)
Glucose, Bld: 400 mg/dL — ABNORMAL HIGH (ref 70–99)
Potassium: 4.3 mEq/L (ref 3.5–5.1)
Sodium: 137 mEq/L (ref 135–145)
Total Bilirubin: 0.3 mg/dL (ref 0.3–1.2)
Total Protein: 7.1 g/dL (ref 6.0–8.3)

## 2010-11-13 LAB — DIFFERENTIAL
Basophils Absolute: 0 10*3/uL (ref 0.0–0.1)
Basophils Relative: 0 % (ref 0–1)
Eosinophils Absolute: 0.1 10*3/uL (ref 0.0–0.7)
Eosinophils Relative: 0 % (ref 0–5)
Lymphocytes Relative: 43 % (ref 12–46)
Lymphs Abs: 5 10*3/uL — ABNORMAL HIGH (ref 0.7–4.0)
Monocytes Absolute: 0.5 10*3/uL (ref 0.1–1.0)
Monocytes Relative: 4 % (ref 3–12)
Neutro Abs: 6 10*3/uL (ref 1.7–7.7)
Neutrophils Relative %: 52 % (ref 43–77)

## 2010-11-13 LAB — CBC
HCT: 48 % — ABNORMAL HIGH (ref 36.0–46.0)
Hemoglobin: 17.1 g/dL — ABNORMAL HIGH (ref 12.0–15.0)
MCH: 30.3 pg (ref 26.0–34.0)
MCHC: 35.6 g/dL (ref 30.0–36.0)
MCV: 85 fL (ref 78.0–100.0)
Platelets: 172 10*3/uL (ref 150–400)
RBC: 5.65 MIL/uL — ABNORMAL HIGH (ref 3.87–5.11)
RDW: 12.4 % (ref 11.5–15.5)
WBC: 11.5 10*3/uL — ABNORMAL HIGH (ref 4.0–10.5)

## 2010-11-13 LAB — URINALYSIS, ROUTINE W REFLEX MICROSCOPIC
Bilirubin Urine: NEGATIVE
Glucose, UA: >1000 mg/dL — AB
Hgb urine dipstick: NEGATIVE
Ketones, ur: NEGATIVE mg/dL
Leukocytes, UA: NEGATIVE
Nitrite: NEGATIVE
Protein, ur: NEGATIVE mg/dL
Specific Gravity, Urine: 1.034 — ABNORMAL HIGH (ref 1.005–1.030)
Urobilinogen, UA: 0.2 mg/dL (ref 0.0–1.0)
pH: 5 (ref 5.0–8.0)

## 2010-11-13 LAB — URINE MICROSCOPIC-ADD ON

## 2010-11-13 LAB — GLUCOSE, CAPILLARY
Glucose-Capillary: 177 mg/dL — ABNORMAL HIGH (ref 70–99)
Glucose-Capillary: 435 mg/dL — ABNORMAL HIGH (ref 70–99)

## 2010-12-01 LAB — CBC
HCT: 43.7 % (ref 36.0–46.0)
Hemoglobin: 14.9 g/dL (ref 12.0–15.0)
MCHC: 34.2 g/dL (ref 30.0–36.0)
MCV: 87.5 fL (ref 78.0–100.0)
Platelets: 174 10*3/uL (ref 150–400)
RBC: 4.99 MIL/uL (ref 3.87–5.11)
RDW: 12 % (ref 11.5–15.5)
WBC: 10.7 10*3/uL — ABNORMAL HIGH (ref 4.0–10.5)

## 2010-12-01 LAB — BASIC METABOLIC PANEL
BUN: 14 mg/dL (ref 6–23)
CO2: 24 mEq/L (ref 19–32)
Calcium: 9.5 mg/dL (ref 8.4–10.5)
Chloride: 100 mEq/L (ref 96–112)
Creatinine, Ser: 0.6 mg/dL (ref 0.4–1.2)
GFR calc Af Amer: >60 mL/min (ref >60)
GFR calc non Af Amer: >60 mL/min (ref >60)
Glucose, Bld: 427 mg/dL — ABNORMAL HIGH (ref 70–99)
Potassium: 3.6 mEq/L (ref 3.5–5.1)
Sodium: 133 mEq/L — ABNORMAL LOW (ref 135–145)

## 2010-12-01 LAB — DIFFERENTIAL
Basophils Absolute: 0.1 10*3/uL (ref 0.0–0.1)
Basophils Relative: 1 % (ref 0–1)
Eosinophils Absolute: 0 10*3/uL (ref 0.0–0.7)
Eosinophils Relative: 0 % (ref 0–5)
Lymphocytes Relative: 40 % (ref 12–46)
Lymphs Abs: 4.2 10*3/uL — ABNORMAL HIGH (ref 0.7–4.0)
Monocytes Absolute: 0.6 10*3/uL (ref 0.1–1.0)
Monocytes Relative: 5 % (ref 3–12)
Neutro Abs: 5.7 10*3/uL (ref 1.7–7.7)
Neutrophils Relative %: 54 % (ref 43–77)

## 2010-12-01 LAB — GLUCOSE, CAPILLARY
Glucose-Capillary: 180 mg/dL — ABNORMAL HIGH (ref 70–99)
Glucose-Capillary: 403 mg/dL — ABNORMAL HIGH (ref 70–99)

## 2010-12-10 LAB — POCT URINALYSIS DIP (DEVICE)
Bilirubin Urine: NEGATIVE
Glucose, UA: >=1000 mg/dL — AB
Hgb urine dipstick: NEGATIVE
Ketones, ur: NEGATIVE mg/dL
Nitrite: NEGATIVE
Protein, ur: NEGATIVE mg/dL
Specific Gravity, Urine: 1.015 (ref 1.005–1.030)
Urobilinogen, UA: 0.2 mg/dL (ref 0.0–1.0)
pH: 5 (ref 5.0–8.0)

## 2010-12-10 LAB — WET PREP, GENITAL
Clue Cells Wet Prep HPF POC: NONE SEEN
Trich, Wet Prep: NONE SEEN
Yeast Wet Prep HPF POC: NONE SEEN

## 2010-12-10 LAB — GLUCOSE, CAPILLARY: Glucose-Capillary: 324 mg/dL — ABNORMAL HIGH (ref 70–99)

## 2010-12-10 LAB — GC/CHLAMYDIA PROBE AMP, GENITAL
Chlamydia, DNA Probe: NEGATIVE
GC Probe Amp, Genital: NEGATIVE

## 2010-12-10 LAB — POCT PREGNANCY, URINE: Preg Test, Ur: NEGATIVE

## 2010-12-25 ENCOUNTER — Inpatient Hospital Stay (INDEPENDENT_AMBULATORY_CARE_PROVIDER_SITE_OTHER)
Admission: RE | Admit: 2010-12-25 | Discharge: 2010-12-25 | Disposition: A | Discharge status: Home / Self Care | Payer: Self-pay | Source: Ambulatory Visit | Attending: Family Medicine | Admitting: Family Medicine

## 2010-12-25 DIAGNOSIS — J019 Acute sinusitis, unspecified: Secondary | ICD-10-CM

## 2011-01-13 NOTE — Op Note (Signed)
[name], [name]              ACCOUNT NO.:  [id number]   MEDICAL RECORD NO.:  [id number]          PATIENT TYPE:  AMB   LOCATION:  SDC                           FACILITY:  [facility]   PHYSICIAN:  Charles A. Delcambre, MDDATE OF BIRTH:  1971/02/22   DATE OF PROCEDURE:  05/23/2007  DATE OF DISCHARGE:                               OPERATIVE REPORT   PREOPERATIVE DIAGNOSES:  1. Diagnosis menorrhagia.  2. Dysfunctional bleeding, irregular bleeding.   POSTOPERATIVE DIAGNOSES:  1. Diagnosis menorrhagia.  2. Dysfunctional bleeding, irregular bleeding.   PROCEDURE:  ThermaChoice endometrial ablation. Paracervical block.   SURGEON:  Charles A. Delcambre, MD   ASSISTANT:  None.   COMPLICATIONS:  None.   BLOOD LOSS:  Nil.   OPERATIVE FINDINGS:  Sounds to 9 cm.   ANESTHESIA:  General by the LMA.   SPONGE NEEDLE COUNT:  Correct x2.   PATHOLOGY:  No specimens and nothing sent to pathology.   DESCRIPTION OF PROCEDURE:  The patient was taken to the operating room,  placed supine position and general anesthetic was induced without  difficulty.  She was then placed in dorsal lithotomy position in  universal stirrups.  A sterile prep and drape was undertaken.  Speculum  was placed in vagina and tenaculum was placed on the anterior lip of the  cervix.  Sound was passed and noted.  There was no evidence of  perforation.  ThermaChoice instrument was then used per protocol.  Aspiration up to -180 and then inserted and pressurized up to a stable  175 pressure.  This was verified to be stable and the instrument was  turned on for the heating cycle.  Pressure remained stable in the 160s.  Heating cycle was accomplished 87 degrees centigrade and complete cycle  with serial pressures was undertaken for therapeutic cycle of 8 minutes.  Instruments removed.  Thereafter cooling phase was done and  15 mL had been inserted into the intrauterine cavity for the procedure.  There was no evidence of  perforation, balloon was intact.  Tenaculum was  removed.  Hemostasis was excellent.  The patient was taken to recovery  with physician in attendance having tolerated procedure well.      Charles A. Sydnee Cabal, MD  Electronically Signed     CAD/MEDQ  D:  05/23/2007  T:  05/24/2007  Job:  [id number]

## 2011-01-13 NOTE — H&P (Signed)
[name], [name]              ACCOUNT NO.:  [id number]   MEDICAL RECORD NO.:  [id number]          PATIENT TYPE:  AMB   LOCATION:  SDC                           FACILITY:  [facility]   PHYSICIAN:  Charles A. Delcambre, MDDATE OF BIRTH:  01-05-1971   DATE OF ADMISSION:  DATE OF DISCHARGE:                              HISTORY & PHYSICAL   HISTORY OF PRESENT ILLNESS:  A 40 year old gravida 5, para 1-1-4-1 who  continues to have irregular bleeding after a D&E was done some months  ago and she is quite frustrated in this regard.  We discussed cycling  with medications.  She wants to take no further medications.  She is on  diabetic medications.  Hemoglobin A1c has been drawn and having formerly  been up in the 8 range, it is now in the 7 range.  She is informed  consent, accepts risks of uterine perforation, failed procedure  technically, failure of procedure therapeutically, approximately 30%  amenorrheic rate, 80-90% eumenorrheic rate , 10% failure by past  studies.  Alternatives of cycling or constant progestin have been  discussed as well as Depo-Provera.  She wishes to proceed with ablation.   PAST MEDICAL HISTORY:  1. Diabetes.  2. Hypertension.  3. Vascular spasms of the heart.  4. Hypercholesterolemia.   PAST SURGICAL HISTORY:  Cholecystectomy.   MEDICATIONS:  1. Metformin 1,000 mg 2 times daily.  2. Glipizide 10 mg once daily.  3. Toprol dose not specified once daily and recently discontinued.  4. Aspirin full strength 1 daily.  5. Isosorbide dose non-specified once a day.  6. Vytorin 10/20 once a day.  7. Protonix dose not specified once a day.  8. Chantix 2 times a day recently to try and help her stop smoking.      Not currently using this medication.  9. 18 units of Levemir h.s   ALLERGIES:  SULFA AND CODEINE, REACTIONS NON-SPECIFIED.   SOCIAL HISTORY:  She smoke 1-1/2 packs of cigarettes per day for the  last 20 years.  Occasional glass of wine.  Otherwise, no  illicit drug  use.  She has 2 sexual partners in the last year.  Currently in a  relationship.  Recent miscarriage.   FAMILY HISTORY:  Hypertension, breast cancer, non-specified, otherwise,  no major illnesses.   REVIEW OF SYSTEMS:  Some skin and acne problems.  Denies fevers, chills,  rashes, lesions, headache, dizziness, seasonal allergies, chest pain,  shortness of breath, wheezing, diarrhea, constipation, bleeding, melena,  hematochezia, urgency, frequency, dysuria, incontinence, hematuria.  galactarea, no emotional changes.   GENERAL:  She is alert and oriented x3.  No distress.  HEART:  Regular rate and rhythm without murmurs, rubs or gallops.  LUNGS:  Clear to auscultation bilaterally.  ABDOMEN:  Obese, no masses palpable.  PELVIC:  Normal external female genitalia.  Bartholin, urethral, skeins  glands within normal limits.  Vault without discharge, lesions.  Cervix  without cervical motion tenderness.  Uterus not enlarged, but limited by  the patient's body habitus, ovaries not palpable bilaterally.   .   ASSESSMENT:  Irregular bleeding for endometrial ablation.  PLAN:  She understands this  render her unable to conceive but she would  still need to use some type of birth control pill.  It is not meant to  prevent pregnancy but pregnancy would likely be a miscarriage if she did  get pregnant.  Condoms were recommended and she was given informed  consent as noted above and will follow up as directed.  I would  recommend that she not take her evening dose of insulin, or at least  take half of it , but further notes and recommendations per preop.      Charles A. Sydnee Cabal, MD  Electronically Signed     CAD/MEDQ  D:  05/17/2007  T:  05/17/2007  Job:  [id number]

## 2011-01-13 NOTE — Op Note (Signed)
[name], [name]              ACCOUNT NO.:  [id number]   MEDICAL RECORD NO.:  [id number]          PATIENT TYPE:  AMB   LOCATION:  DAY                          FACILITY:  [facility]   PHYSICIAN:  Sharlet Salina T. Hoxworth, M.D.DATE OF BIRTH:  November 10, 1970   DATE OF PROCEDURE:  03/10/2007  DATE OF DISCHARGE:  03/10/2007                               OPERATIVE REPORT   PREOPERATIVE DIAGNOSIS:  Left buttock abscess.   POSTOPERATIVE DIAGNOSIS:  Left buttock abscess.   SURGICAL PROCEDURES:  Incision and drainage, left buttock abscess.   SURGEON:  Lorne Skeens. Hoxworth, M.D.   ANESTHESIA:  General.   BRIEF HISTORY:  [name] is a 40 year old diabetic female who for  about 1 week has had gradually increasing swelling, pressure and pain  over the left buttock.  She was seen in the office late yesterday and  was found to have a 6 or 7 cm area of induration and erythema in the  left buttock consistent with abscess.  This did not communicate up to  the perirectal area.  I&D under local had been attempted at the family  practice office without success.  I have recommended proceeding to the  operating room for I&D under general anesthesia.  The patient due to  personal issues could not do this yesterday and she is now brought to  the operating room today.  She has been on p.o. Cipro.  The nature of  the procedure, its indications, risks of bleeding and infection, were  discussed and understood.   DESCRIPTION OF OPERATION:  The patient was brought to the operating room  and placed in the supine position on the operating table and general  anesthesia was induced.  She was placed carefully in the lithotomy  position and the perineum sterilely prepped and draped.  The area was  about 6 or 7 cm lateral to the rectum over the buttock.  An  approximately 3-4 cm incision was made directly overlying the area into  the deep subcutaneous space, a large abscess cavity entered and a large  amount of purulent  material drained.  This was cultured.  There were a  few loculations that were broken up and the abscess cavity was  thoroughly irrigated.  This did not track toward the rectum or anywhere  deeper.  Hemostasis was obtained with the cautery.  The soft tissue was  infiltrated with Marcaine with epinephrine.  The wound was packed with  moist saline gauze.  A dry sterile dressing was applied.  She was taken  to recovery in good condition.      Lorne Skeens. Hoxworth, M.D.  Electronically Signed     BTH/MEDQ  D:  03/10/2007  T:  03/11/2007  Job:  [id number]

## 2011-01-13 NOTE — H&P (Signed)
[name], [name]              ACCOUNT NO.:  [id number]   MEDICAL RECORD NO.:  [id number]          PATIENT TYPE:  AMB   LOCATION:  SDC                           FACILITY:  [facility]   PHYSICIAN:  [name], MDDATE OF BIRTH:  June 10, 1971   DATE OF ADMISSION:  DATE OF DISCHARGE:                              HISTORY & PHYSICAL   She is a 39 year old gravida 6, para 1-0-5-1, with irregular bleeding,  constant, and sometimes heavy bleeding on Depo-Provera for  contraception.  She is going to undergo ablation at this time,  ThermaChoice or if necessary roller ball type ablation and understands  that there is approximately 30% amenorrhea, 80-90% eumenorrhea, and 10%  outright failure.  The possibility of technical failure, uterine  perforation are accepted as well.   PAST MEDICAL HISTORY:  1. Diabetes.  2. Hypertension.  3. Vascular spasms of the heart in the past.  4. Hypercholesterolemia.   PAST SURGICAL HISTORY:  1. Cholecystectomy.  2. D&E for 1st trimester blighted ovum.   MEDICATIONS:  1. Metformin 1,000 mg two times daily.  2. Glipizide 10 mg once daily.  3. Levemir 10 units q.h.s. insulin.  4. Vytorin 10/20 once a day.  5. Protonix 40 mg once a day.   ALLERGIES:  1. SULFA.  2. CODEINE.  Reactions not specified.   SOCIAL HISTORY:  She smokes about a half pack per day of cigarettes.  Occasional glass of wine.  No illicit drug use.  She has had 3 sexual  partners in the last year.  Currently in a relationship with the baby's  father if the previous pregnancy had continued but terminated in a  blighted ovum.   FAMILY HISTORY:  Hypertension and breast cancer, not specified,  otherwise no major illnesses.   REVIEW OF SYSTEMS:  Some apnea and skin problems.  Denies fever, chills,  rashes, lesions, headaches, dizziness.  Some seasonal allergies.  No  chest pain, shortness of breath, wheezing, diarrhea, constipation,  bleeding, melena, hematochezia, urgency,  frequency, dysuria,  incontinence, or hematuria.  No galactorrhea or emotional changes.   PHYSICAL EXAMINATION:  GENERAL:  Alert and oriented x3.  No distress.  Obese.  VITAL SIGNS:  Weight 264 pounds, heart rate 96, respirations 18, blood  pressure 140/86.  HEENT:  Grossly within normal limits.  NECK:  Supple without thyromegaly or adenopathy.  LUNGS:  Clear bilaterally.  HEART:  Regular rhythm without murmur, rub, or gallop.  BREASTS:  Symmetrical and otherwise deferred.  ABDOMEN:  Moderate obesity is present.  PELVIC:  Normal external female genitalia.  Bartholin, urethral, and  Skene within normal limits.  Vault without discharge or lesions.  Multiparous cervix.  No cervical motion tenderness.  Uterus not  enlarged.  Adnexa nontender without masses bilaterally.   ASSESSMENT:  1. Irregular bleeding on Depo-Provera.  2. Diabetes.  3. Hypertension.  4. Vascular spasms of the heart in the past.  5. Hypercholesterolemia.   PLAN:  ThermaChoice endometrial ablation, possible roller ball ablation.  She gives informed consent as noted including perforation, bowel and  bladder damage.  All questions were answered.  She will  follow up as per  recommendations with pre-op at hospital tomorrow.  Insulin likely to be  held.  I will see her Monday for surgery.      Meghan A. Sydnee Cabal, MD  Electronically Signed     CAD/MEDQ  D:  05/19/2007  T:  05/19/2007  Job:  [id number]

## 2011-01-16 NOTE — Procedures (Signed)
[name], [name] NO.:  [id number]   MEDICAL RECORD NO.:  [id number]          PATIENT TYPE:  OUT   LOCATION:  SLEEP CENTER                 FACILITY:  [facility]   PHYSICIAN:  Clinton D. Maple Hudson, M.D. DATE OF BIRTH:  [birth year]   DATE OF STUDY:  09/24/2004                              NOCTURNAL POLYSOMNOGRAM   REFERRING PHYSICIAN:  Clyde Lundborg, MD   INDICATIONS FOR STUDY:  Hypersomnia with sleep apnea. Epworth sleepiness  score 19/24, BMI 42, weight 280 pounds.   SLEEP ARCHITECTURE:  Total sleep time 293 minutes with sleep efficiency of  82%. Stage I was 5%, stage II was 56%, stages III and IV were 14%, REM was  25% of total sleep time. Latency to sleep onset 4 minutes. Latency to REM 90  minutes. Awake after sleep onset 26 minutes. Arousal index 13.   RESPIRATORY DATA:  Split study protocol. RDI 64.3 obstructive events per  hour indicating severe obstructive sleep apnea/hypopnea syndrome before  CPAP. This included 81 obstructive apneas and 77 hypopneas before CPAP. The  events were not positional, but most of the sleep was supine. REM RDI was 34  per hour. CPAP was titrated to 10 CWP, RDI 0 per hour using a petite Comfort  Gel mask with heated humidifier.   OXYGEN DATA:  Snoring was noted before CPAP with oxygen desaturation to a  nadir of 78%. After CPAP control saturation held to 94% to 96% on room air.  Tech noted that the patient woke coughing in the morning after CPAP, but  indicated she had been coughing since December. She woke early and rested.  The study was ended at her request at 4:19 a.m.   CARDIAC DATA:  Normal sinus rhythm.   MOVEMENT/PARASOMNIA:  A total of 74 limb jerks were recorded of which 7 were  associated with arousal or awakening for a periodic limb movement index of  1.4 per hour which is insignificant.   IMPRESSION/RECOMMENDATIONS:  1.  Severe obstructive sleep apnea/hypopnea syndrome, RDI of 64.3 per hour      with oxygen  desaturation to 70%.  2.  CPAP titration to 10 CWP, RDI of 0 per hour using a petite Comfort Gel      mask with heated humidifier.      CDY/MEDQ  D:  10/05/2004 10:21:14  T:  10/05/2004 21:11:51  Job:  [id number]

## 2011-01-16 NOTE — Consult Note (Signed)
[name], [name] NO.:  [id number]   MEDICAL RECORD NO.:  [id number]                   PATIENT TYPE:  INP   LOCATION:  3703                                 FACILITY:  [facility]   PHYSICIAN:  W. Ashley Royalty., M.D.         DATE OF BIRTH:  1971-07-04   DATE OF CONSULTATION:  04/26/2003  DATE OF DISCHARGE:                                   CONSULTATION   HISTORY:  A 40 year old female with a history of chest discomfort and some  arm discomfort.  Her history is pertinent in that her husband died  approximately two weeks ago fairly suddenly and was thought to have had  significant heart disease.  She has a history of gestational diabetes and  hypertension.  Also smokes between one and two packs of cigarettes per day  which has been more heavy since the death of her husband two weeks ago.  She  developed aching left arm and shoulder pain for several days, and last  evening while bathing her handicapped son had the onset of substernal chest  aching which was constant.  Went to bed and woke up with similar discomfort  and presented to the emergency room.  Initial cardiac enzymes were negative  and an EKG also was negative.  She had a negative EKG during pain and right  now is complaining of some vague chest aching.   PAST MEDICAL HISTORY:  Remarkable for gestational diabetes and hypertension  during pregnancy, history of obesity and significant tobacco abuse.   PREVIOUS MEDICATIONS:  None.   CURRENT MEDICATIONS:  She is currently on beta blockers and aspirin.   SOCIAL HISTORY:  She is a stay-at-home mom, has a handicapped son who is 4  that she cares for.  Her husband formerly was a Teacher, early years/pre.  She has  smoked 1-2 packs of cigarettes per day.  Does not use alcohol or drugs to  excess.   FAMILY HISTORY:  Her father had a heart attack and bypass grafting at age  63, sister had bypass grafting at age 62.  Husband has recently died.   REVIEW OF  SYSTEMS:  Some nausea and dyspnea recently.  Thinks her chest pain  may have been worse somewhat with food recently.   PHYSICAL EXAMINATION:  GENERAL:  Pleasant obese female in no acute distress.  VITAL SIGNS:  Blood pressure currently 130/80, pulse 65.  SKIN:  Warm and dry.  ENT:  Normal.  LUNGS:  Clear.  CARDIAC:  Normal S1 & S2.  No S3.  ABDOMEN:  Soft, nontender.  No mass.  EXTREMITIES:  Femoral and distal pulses 2+.   LABORATORY DATA:  CBC is normal.  Chemistry panel showed a glucose of 203.  D-dimer was normal.  PT and PTT were normal.  Three sets of enzymes were  normal.  EKG showed sinus rhythm.   IMPRESSION:  1. Chest discomfort with some atypical, other typical features, rule  out     unstable angina or myocardial infarction.  Many features of the pain are     atypical though and would favor noninvasive evaluation if enzymes are     negative.  2. Obesity.  3. Tobacco abuse.  4. Recent severe situational stress and grief with the death of her husband.     Poor social situation with a handicapped son.  5. Elevated glucose.   RECOMMENDATIONS:  Obtain adenosine Cardiolite scan and risk factor  modification.                                               Darden Palmer., M.D.    WST/MEDQ  D:  04/26/2003  T:  04/26/2003  Job:  [id number]   cc:   Ileana Roup, M.D.  [address], [place] [address]  Fax: [phone]

## 2011-01-16 NOTE — Cardiovascular Report (Signed)
[name], [place] NO.:  [id number]   MEDICAL RECORD NO.:  [id number]          PATIENT TYPE:  OIB   LOCATION:  1967                         FACILITY:  [facility]   PHYSICIAN:  Veverly Fells. [name], MD  DATE OF BIRTH:  [birth year]   DATE OF PROCEDURE:  07/29/2006  DATE OF DISCHARGE:  07/29/2006                            CARDIAC CATHETERIZATION   PROCEDURE:  Left heart catheterization, selective coronary angiography  and ventricular angiography.   INDICATION:  [name] is a very nice of 40 year old woman who  presented to the emergency department with chest pain.  She has a  history of obesity, type 2 diabetes, hypertension and dyslipidemia, as  well as family history of coronary artery disease.  She was recommended  for discharge, but left the hospital AMA due to concerns regarding her  family.  She presents today for a diagnostic left heart catheterization.   Procedural details, risks and indications for the procedure were  explained in detail to the patient.  Informed consent was obtained.   DESCRIPTION OF PROCEDURE:  The right groin was prepped, draped and  anesthetized with 1% lidocaine.  Using a modified Seldinger technique, a  4-French arterial sheath was placed in the right femoral artery.  Diagnostic images of the left and right coronary artery were taken.  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  I attempted to find the  anomalous left circumflex with a 4-French 3-D RC catheter, JR4, AR-1, AL-  2 and multipurpose catheter.  I was able to image the vessel, but not  selectively.  It was poorly visualized.  Following selective coronary  angiography, an angled pigtail catheter was inserted into the left  ventricle and left ventricular pressures were recorded.  A 30-degree  right anterior oblique left ventriculogram was done.  A pullback across  the aortic valve was performed.  At the conclusion of the case, the  arterial sheath was pulled and manual pressure  used for hemostasis.   FINDINGS:  Aortic pressure 111/70 with a mean of 90, left ventricular  pressure 111/3 with an end-diastolic pressure of 10.   CORONARY ANGIOGRAPHY:  There is no true left main stem.  The LAD has a  separate ostium.  The LAD is a large-diameter vessel coursing down to  the left ventricular apex; it gives off a large first and second  diagonal branch and the second diagonal branch bifurcates into twin  vessels.  There is 1 large septal perforator.  There is no significant  obstructive disease throughout the LAD system.   The right coronary artery is dominant.  It gives off an acute marginal  branch in its midportion as well as a PDA and 2 posterolateral segments.  There is no significant disease throughout the right coronary artery.  There was spasm just off the end of the catheter on the second  angiographic image.  This was clearly normal on other images.   There is an anomalous left circumflex that takes off from the right  coronary cusp.  It has a very anteriorly directed takeoff.  As above, I  attempted to image this with  multiple catheters and was not able to  selectively engage this vessel.  Its proximal portions are clearly  normal, but I am unable to visualize the distal portion of the vessel.   LEFT VENTRICULOGRAM:  Performed in the 30-degree RAO projection  demonstrates normal left ventricular function with an estimated left  ventricular ejection fraction of 60%.   ASSESSMENT:  1. No obstructive coronary artery disease.  2. 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  3. Normal left ventricular function.   PLAN:  I would recommend a CT coronary angiogram in a few weeks to  visualize the patient's left circumflex better.  I do not suspect that  she has any disease in this vessel, although I cannot be certain, based  on the views taken.  There certainly is no disease in the proximal  portion of vessel.  As her other coronaries are normal,  I would expect  no disease.  We will plan on waiting approximately 2 weeks to allow her  kidneys to recover from her contrast load.  In the meantime, she should  continue on her current therapy.      Veverly Fells. [name], MD  Electronically Signed     MDC/MEDQ  D:  07/29/2006  T:  07/30/2006  Job:  [id number]   cc:   Meghan Sidle, MD  [name], M.D.

## 2011-01-16 NOTE — Assessment & Plan Note (Signed)
[facility]                            CARDIOLOGY OFFICE NOTE   Deleon, [name]                       MRN:          [id number]  DATE:08/03/2006                            DOB:          Apr 12, 1971    This is a 40 year old white female patient who was recently hospitalized  with chest pain.  She underwent cardiac catheterization which revealed  no obstructive coronary artery disease but an anomalous left circumflex  from the right coronary cusp and normal LV function.  Dr. [name]  recommended a CT coronary angiogram in a few weeks to evaluate the left  circumflex better.  He did not suspect there was any disease in this  vessel, although he could not be certain on the views taken.  There was  no disease in the proximal portion of this vessel.   Since the patient has been home she had one episode of chest pain on  Friday night.  She became very upset with her boyfriend and developed a  constricting-type pressure in her chest that went in her neck and down  her arm and lasted an hour-and-a-half.  She also says that when her  blood pressure is taken it causes the same pain when she was in our  office today.  She is very anxious and has a lot of stress and thinks  this may be part of her trouble.  She has also had pain at her  catheterization site that goes all the way up her right side of her  abdomen and down into her right groin area and leg.   CURRENT MEDICATIONS:  1. Metformin 1000 mg b.i.d.  2. Glipizide 5 mg daily.  3. Toprol-XL 25 mg daily.  4. Zocor 20 mg q.h.s.   PHYSICAL EXAMINATION:  GENERAL:  This is a very anxious 40 year old  white female in no acute distress.  VITAL SIGNS:  Blood pressure 140/80, pulse 80, weight 263.  NECK:  Without JVD, HJR, bruit or thyroid enlargement.  LUNGS:  Clear anterior, posterior, and lateral.  HEART:  Regular rate and rhythm at 80 beats per minute, normal S1 and  S2.  No murmur, rub, bruit, thrill or  heave noted.  ABDOMEN:  Obese, normal active bowel sounds are heard throughout.  RIGHT GROIN:  The patient stated that with very light touch to her groin  area she told me this caused severe pain and actually started crying and  apologizing at the same time.  She has very mild bruising.  There is no  hematoma, widened pulse, or bruit.  The patient was very sensitive to  this exam.  EXTREMITIES:  Without cyanosis, clubbing or edema.  She has good distal  pulses.   IMPRESSION:  1. 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  2. Normal left ventricular function.  3. Anxiety.  4. Diabetes mellitus.  5. Family history of coronary artery disease.   PLAN AT THIS TIME:  I have asked the patient to obtain a medical doctor  and talked to her about anxiety.  She is scheduled for the CT angiogram  and she will see Dr. [name] back in followup after that test is  performed.  I have given her a note to allow her to return to work and  if she continues to have pain in her groin area I told her we could do  an ultrasound, but at this time her exam does not warrant this.      Jacolyn Reedy, PA-C  Electronically Signed      Luis Abed, MD, [facility]  Electronically Signed   ML/MedQ  DD: 08/03/2006  DT: 08/03/2006  Job #: [id number]

## 2011-01-16 NOTE — Letter (Signed)
August 05, 2006    TO WHOM IT MAY CONCERN   RE:  [name], [name]  MRN:  [id number]  /  DOB:  03/14/1971   [name] is a 40 year old patient of [facility].  She was  set up to have a cardiac CT.  Apparently for insurance reasons this was  cancelled.  Please refer to my partner, Dr. Casimiro Needle Cooper's previous  letter dated December 12.   The patient needs a cardiac CTA using code 0146T and 0151T.   She has an anomalous circumflex coronary artery that was not well  visualized on cath.  She presented with chest pain.  Her LAD and right  coronary artery are normal, however the circumflex cannot be completely  visualized despite multiple attempts at catheterization, it was never  selectively cannulized.   It was vaguely seen on one flush injection of the right cusp.   In short, the patient is having significant chest pain and has not had  her circumflex coronary artery properly evaluated due to its anomalous  origin.  Looking at the [facility] guidelines for cardiac CTA this clearly  falls within the realm of the 0146 and 0151T codes.  If you have any  further questions, do not hesitate to contact me.    Sincerely,      Noralyn Pick. Eden Emms, MD, [facility]  Electronically Signed    PCN/MedQ  DD: 08/12/2006  DT: 08/12/2006  Job #: [id number]

## 2011-01-16 NOTE — Consult Note (Signed)
[name], [name] NO.:  [id number]   MEDICAL RECORD NO.:  [id number]          PATIENT TYPE:  EMS   LOCATION:  MAJO                         FACILITY:  [facility]   PHYSICIAN:  Jonelle Sidle, MD DATE OF BIRTH:  1971-04-27   DATE OF PROCEDURE:  07/27/2006  DATE OF DISCHARGE:                      STAT - MUST CHANGE TO CORRECT WORK TYPE   REASON FOR CONSULTATION:  Chest pain.   HISTORY OF PRESENT ILLNESS:  [name] is a 40 year old woman with history  of obesity, type 2 diabetes mellitus, hypertension, hyperlipidemia, and a  significant family history of premature cardiovascular disease.  In addition  to this, she has a pack per day tobacco use history for the last 19 years.  She states that she has been under a lot of stress recently, reporting that  her mother is hospitalized in [place] and that she also has a  developmentally delayed 30-year-old son whom she cares for at home.  She has  been experiencing recent onset chest discomfort described as a heaviness  that actually began a week ago and has been intermittent since that time.  She noted it to be worse this morning after she awoke and was seen in the  urgent care clinic by Dr. Cleta Alberts.  She had an electrocardiogram obtained at  that time that showed normal sinus rhythm, with no ST elevation or  significant ST segment depression and was referred then to the emergency  department for further evaluation.  She states that her symptoms are not  necessarily worse with exertion.  She has had radiation to the left neck and  left arm, but no associated shortness of breath, nausea, or diaphoresis.  On  my exam, she was denying active chest pain.  Her initial cardiac markers are  normal.  Her chest x-ray report indicates low lung volumes, with some  crowding of the pulmonary vasculature, but no acute findings.  In reviewing  her records, she was admitted to the hospital back in 2004 and was seen by  Dr. Lacretia Nicks.  Viann Fish at that time.  She underwent a Cardiolite study in  August 2004 which showed no evidence of ischemia and an ejection fraction of  66%.  She has not had regular followup with a primary care physician for the  last few years and has had no follow-up cardiac testing.   ALLERGIES:  1. CODEINE.  2. SULFA DRUGS.   PRESENT MEDICATIONS:  1. Metformin 1000 mg p.o. b.i.d.  2. Glipizide 5 mg p.o. daily.  3. Prilosec OTC p.r.n.   PAST MEDICAL HISTORY:  1. As outlined in the history of present illness.  2. She is also status post cholecystectomy.  3. She has a history of preeclampsia with pregnancy in the past.   SOCIAL HISTORY:  The patient is a widow.  Her husband died with a massive  myocardial infarction 3 years ago.  She lives in [place] and works as a  Designer, multimedia.  She has a pack per day history of tobacco use for 19 years.  Denies any alcohol or other substance use.   FAMILY HISTORY:  Significant for premature  cardiovascular disease in the  patient's father as well as in her sister who died of a myocardial  infarction at age 79.  Her mother is presently hospitalized in [place]  with a reported cerebral hemorrhage.   REVIEW OF SYSTEMS:  As described in the history of present illness.  She has  had chronic dyspnea on exertion at NYHA Class II severity, which has not  changed.   PHYSICAL EXAMINATION:  VITAL SIGNS:  Temperature is 97.4 degrees, heart rate  86, in sinus rhythm, oxygen saturation is 99% on 2 liters nasal cannula,  respiratory rate is 20, blood pressure is 113/71, weight is 261 pounds.  GENERAL:  This is an obese woman in no acute distress.  HEENT:  Conjunctiva is normal.  Pharynx is clear.  NECK:  Supple, without elevated jugular venous pressure No bruits or  thyromegaly is noted.  LUNGS:  Clear, without labored breathing at rest.  There is no wheezing  noted.  CARDIAC:  Reveals a regular rate and rhythm, without murmur or gallop.  ABDOMEN:   Obese, nontender.  No hepatomegaly.  No bruits.  No tenderness to  palpation.  EXTREMITIES:  No clubbing, cyanosis, or edema.  Distal pulses are 1-2+.  SKIN:  No ulcers or changes noted.  MUSCULOSKELETAL:  No kyphosis is noted.  NEUROPSYCHIATRIC:  The patient is alert and oriented x3.  Affect is normal.   INITIAL LABORATORY DATA:  WBC is 9.1, hemoglobin 15.3, hematocrit 44.8,  platelets 191.  Sodium 137, potassium 4.3, chloride 104, bicarbonate 25,  glucose 154, BUN 13, creatinine 0.7.  AST 17, ALT 21, alkaline phosphatase  60, total bilirubin 0.5.  Cardiac markers pending.   IMPRESSION:  1. Chest pain syndrome, concerning for unstable angina in a 40 year old      obese woman with type 2 diabetes mellitus, hypertension,      hyperlipidemia, longstanding tobacco abuse, and a family history of      premature cardiovascular disease.  Her electrocardiogram is      nonspecific.  Cardiac markers are normal initially.  2. Previous history of reportedly normal Cardiolite in 2004, with normal      ejection fraction.   RECOMMENDATIONS:  I discussed the situation extensively with the patient,  and I have recommended admission to the hospital for further cardiac  testing, most likely to include a diagnostic cardiac catheterization to  clearly assess her coronary anatomy.  She tells me quite frankly that due to  personal constraints, specifically caring for her son and other family  concerns, she is not willing to stay in the hospital and prefers to sign out  against medical advice today.  We explained to her the risk involved,  including adverse cardiac events, such as myocardial infarction or even  death, and she state that she is willing to accept this risk.  We did  discuss potentially trying to schedule an outpatient cardiac catheterization  for later in the week once she has been able to make some arrangements for childcare at home with her family.  She has agreed to an outpatient cardiac   catheterization which will therefore be scheduled this Thursday.  In the  meanwhile, we have instructed her to begin taking aspirin 325 mg daily, and  I have also provided prescriptions for Zocor 20 mg p.o. at bedtime, Toprol-  XL 25 mg p.o. daily, and sublingual nitroglycerin.  She will  hold her metformin in anticipation of angiography, and otherwise continue  her glipizide and Prilosec.  Key to  her care will also be establishing with  a primary care Shandreka Dante long term, and we can certainly assist with this.  Further plans to follow.      Jonelle Sidle, MD  Electronically Signed     SGM/MEDQ  D:  07/27/2006  T:  07/27/2006  Job:  [id number]   cc:   Brett Canales A. Cleta Alberts, M.D.

## 2011-01-16 NOTE — Letter (Signed)
August 11, 2006    Fax #[phone]   RE:  ERSIE, SAVINO  MRN:  [id number]  /  DOB:  [birth year]   To whom it may concern,   Lavon Horn is a 40 year old woman who was recently hospitalized with  chest pain.  She has complained of severe substernal chest pain and in  the setting of multiple cardiovascular risk factors was referred for a  diagnostic coronary angiogram.  I performed her cardiac catheterization  and found that she had a normal right coronary artery and LAD.  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  I used multiple catheters, but was unable  to selectively engage this vessel and therefore this vessel was poorly  visualized at the time of catheterization.  I have referred her for a CT  coronary angiogram to better assess her anomalous left circumflex and to  evaluate for any significant obstructive disease in this vessel.   As the patient continues to have intermittent chest pain following her  catheterization, I think it is medically necessary to better define her  anomalous left circumflex and evaluate for significant atherosclerotic  or obstructive disease in this vessel.  The best way to evaluate this  would be with a CT coronary angiogram which is the gold standard for  assessment of an anomalous coronary.   Therefore I am requesting that Jerris Keltz undergo CT angiography of  the coronary arteries to include native and anomalous coronary arteries  without quantitative evaluation of coronary calcium.   Ms. Mcbrearty has significant financial constraints and will be unable to  pay for this test without preauthorization from her insurance.  Therefore this letter is in request of payment for the above procedure  to be performed.   Please feel free to contact me at any time with questions regarding this  issue.    Sincerely,      Veverly Fells. [name], MD  Electronically Signed    MDC/MedQ  DD: 08/11/2006  DT: 08/12/2006  Job #: [id number]

## 2011-01-16 NOTE — Discharge Summary (Signed)
[name], [name]                        ACCOUNT NO.:  [id number]   MEDICAL RECORD NO.:  [id number]                   PATIENT TYPE:  INP   LOCATION:  3709                                 FACILITY:  [facility]   PHYSICIAN:  Jerre Vandrunen Dictator                    DATE OF BIRTH:  1971-05-12   DATE OF ADMISSION:  04/26/2003  DATE OF DISCHARGE:  04/27/2003                                 DISCHARGE SUMMARY   CHIEF COMPLAINT:  Chest pain.   CONTINUITY PHYSICIAN:  None.   CONSULTATIONS:  Cardiology.   DISCHARGE DIAGNOSES:  1. Chest pain, most likely due to musculoskeletal etiology.  2. Newly diagnosed diabetes mellitus with hemoglobin A1C at 8.6.  3. Newly diagnosed hyperlipidemia.  4. Obesity.  5. Tobacco abuse.  6. History of exercise induced asthma.  7. Status post cholecystectomy.  8. History of gestational diabetes mellitus.  9. History of preeclampsia during pregnancy.   DISCHARGE MEDICATIONS:  Aspirin 81 mg p.o. q.d.   FOLLOWUP:  The patient was scheduled a hospital followup with the [facility] with Dr. Royetta Asal on May 09, 2003, at 2 p.m.  During this  appointment, her blood sugars will be checked, as well as she will be  started on her diabetes control as well as her hypertension control as well  as her hyperlipidemia control medications.  The patient will also be  provided education concerning diabetes, as well as appropriate diet for a  diabetic.   PROCEDURES PERFORMED:  1. The patient had a Adenosine Cardiolite performed on April 27, 2003,     showing no signs of ischemia.  EF function of 66%.  2. The patient had a chest x-ray done on April 26, 2003, showing no acute     or active disease.   CONSULTATIONS:  Cardiology, performed by Dr. Viann Fish.   ADMITTING H&P:  The patient is a very pleasant 40 year old white female with  a 50 year history of tobacco, severe family history of early onset heart  disease, as well as elevated blood sugars, and no  prior medical care,  presents to the emergency room with a two day history of left shoulder, left  chest, and left-sided back pain that started shortly after lifting a 26-year-  old.  The patient reports the pain is brought on by moving her left arm,  feels like dull ache or bruised muscle.  The patient describes pain as 3/10  in intensity, although constantly present in the background.  The patient  does report some shortness of breath at baseline, as well as increased  shortness of breath during this event.  The patient also reports some minor  nausea, but no vomiting, no diaphoresis.  It appears to be nonexertional and  relieved by not moving left shoulder.  The patient denies any abdominal  pain, epigastric pain.  She says there is no edema.  She denies  any fevers  or shaking chills, any upper respiratory signs or cough, or sick contact.   SOCIAL HISTORY:  Significant for patient having been recently widowed two  weeks ago by the death of her husband due to acute massive MI.   FAMILY HISTORY:  Significant for the patient having a sister who required a  CABG, triple bypass, 42 years old, who presented to the emergency department  with very similar symptoms, as well as a father who has survived a massive  heart attack at 41.  The patient reports not being on any regular  medications, and not taking any medications.   PAST MEDICAL HISTORY:  1. Obesity.  2. Gestational diabetes.  3. A 50 pack year history of tobacco abuse.   PHYSICAL EXAMINATION:  VITAL SIGNS:  Pulse 92, blood pressure 119/63,  temperature 93.3, respirations 18, oxygen saturation 98 on room air.  GENERAL:  The patient was severely obese, but very pleasant, breathing  comfortably on 2 L of oxygen, in no particular distress.  NECK:  No JVD or bruits.  HEART:  Regular rate and rhythm, no murmurs, rubs, or gallops.  PULMONARY:  Clear to auscultation bilaterally.  MUSCULOSKELETAL:  Significant for severe muscle tenderness  of the left  shoulder, back, as well as the chest.  The ropey-like muscles along the left  side of her spine which were very tenderness to palpation.  The patient at  this point had reported that palpating her back muscle as well as her chest  had brought on this pain that she was experiencing.  Otherwise, physical  examination was noncontributory.   LABORATORY DATA:  White blood cell count of 8.1, glucose level of 203,  creatinine 0.7.  EKG has been performed which showed early repolarization in  2, 3, and aVF.  The patient had cardiac enzymes drawn which were negative.  Troponin level at 0.05 x3.  Total CK was 74.  The patient also had PT 11.7,  INR of 0.8, D-dimer of less then 0.22.  Liver function tests were within  normal limits.  The patient was started on nitro drip, aspirin, and  morphine, and was admitted to telemetry floor.   HOSPITAL COURSE:  1. CHEST PAIN:  On presentation, chest pain was apparently induced by     palpation of her chest as well as her back which was very atypical for     cardiac chest pain, but due to the patient's severe family history of     early onset heart disease, as well as elevated blood glucose levels which     were suggestive of possible diabetes mellitus, as well as long-standing     history of tobacco abuse, the patient was started on aspirin,     nitroglycerin drip, as well as morphine.  An EKG had been performed that     showed no signs of ischemia or infarction.  The cardiac enzymes have been     drawn x3 in order to rule out the possibility of cardiac cause of this     chest pain.  The patient was has been admitted, and an Adenosine     Cardiolite study has been scheduled which was performed the next day.     The Cardiolite study has shown no inducible ischemia, no evidence of     infarction, with ejection fraction of 22%.  It was thought that the    patient's pain was most likely secondary to musculoskeletal origin, but     due to  the  patient being diagnosed with diabetes as well as     hyperlipidemia, as well as elevated blood pressure, the patient was asked     to follow up with Dr. Royetta Asal, at which time she would be started on     appropriate diabetes care management, as well as her hyperlipidemia     management.  2. HYPERGLYCEMIA:  On presentation, the patient's glucose levels were above     200.  Hemoglobin A1C had been drawn and was noted to be 8.6.  The patient     was considered to have very high probability of diabetes mellitus, and     upon further questioning had reported having some symptoms of blurry     vision as well as increased thirst and fatigue, at which point a     diagnosis would be made with 200 levels.  The patient was discharged to     home with instructions to follow up concerning her long-term diabetes     management with Dr. Royetta Asal at [facility].  3. NEWLY DIAGNOSED HYPERLIPIDEMIA:  The patient's total cholesterol was 237,     with triglycerides of 535, and HDL of 33.  Her LDL was not available.     The patient was discharged to home and recommended to follow up     concerning those findings with Dr. Royetta Asal on her return visit.  4. ELEVATED BLOOD PRESSURE:  During hospital stay, the patient had blood     pressure readings as high as 165/80.  The patient was reporting to be in     pain at the time.  Due to this, a diagnosis of hypertension could not be     made at this particular incident, but the patient was instructed to     follow up at her clinic visit with Dr. Royetta Asal at which point her blood     pressure will be rechecked, and if it is elevated on two more occasions,     she will be treated accordingly for a diabetic.  A blood pressure goal     would be 130/85.  5. SENSATION OF HEAT INTOLERANCE:  During her hospital stay the patient     reported feeling always hot when other people were comfortable.  Her TSH     level was drawn, it was 1.65.  6. TOBACCO ABUSE:  During her  hospital stay, the patient was started on     nicotine patch.  The patient was very strongly encouraged quitting     tobacco.  It was felt that she was very much interested in doing so.  We     will followup the progress on an outpatient basis.   DISCHARGE LABORATORY DATA:  On the day of discharge, the patient had a white  blood cell count of 9, hemoglobin of 13.4, hematocrit of 39.2, platelet  count 171.  Her creatinine was 1.  Her glucose was 161.  Her potassium was  3.6.  sodium of 138.  The patient had cardiac enzymes x3 which were negative  with troponin levels of less then 0.01, total kinase maximum 95.  Also on  the day of discharge, total cholesterol was 237, triglycerides 535, HDL 33,  with total cholesterol/HDL ratio of 7.2.  TSH value of 1.65.  Dorothye Berni Dictator    /MEDQ  D:  04/27/2003  T:  04/29/2003  Job:  [id number]

## 2011-01-16 NOTE — Op Note (Signed)
[name], [name]              ACCOUNT NO.:  [id number]   MEDICAL RECORD NO.:  [id number]          PATIENT TYPE:  AMB   LOCATION:  SDC                           FACILITY:  [facility]   PHYSICIAN:  Charles A. Delcambre, MDDATE OF BIRTH:  [birth year]   DATE OF PROCEDURE:  10/01/2006  DATE OF DISCHARGE:                               OPERATIVE REPORT   PREOPERATIVE DIAGNOSIS:  Blighted ovum, nonviable pregnant at [redacted] weeks.   POSTOPERATIVE DIAGNOSIS:  Blighted ovum, nonviable pregnant at [redacted] weeks.   PROCEDURE:  1. Dilation and evacuation.  2. Paracervical block.   SURGEON:  Charles A. Delcambre, MD   ASSISTANT:  None.   COMPLICATIONS:  None.   ESTIMATED BLOOD LOSS:  25 mL.   SPECIMENS:  Products of conception to Pathology.   ANESTHESIA:  Monitored anesthesia care with IV sedation.   DESCRIPTION OF PROCEDURE:  The patient was taken to the operating room  and placed in supine position and sedation was accomplished.  She was  then placed in the dorsal lithotomy position in the Universal stirrups  and sterile prep and drape was undertaken.  A Graves speculum was placed  in the vagina.  Cervix was visualized and grasped with a single-tooth  tenaculum.  Hanks dilators were used to dilate up to Hanks 16 without  difficulty or evidence of perforation.  A 7-mm suction curette was then  applied at suction pressure of 50 and 2 successive passes were made  without evidence of perforation.  A moderate amount of tissue was  obtained.  A small banjo curette was then used to explore the uterine  cavity; no tissue was obtained.  A suction curette was passed again,  yielding a very small amount of tissue; it was passed a final time with  no further tissue noted.  Hemostasis was adequate.  Tenaculum was  removed; hemostasis was adequate.  The patient was awakened and taken to  Recovery with physician in attendance, having tolerated her procedure  well.      Charles A. Sydnee Cabal, MD  Electronically Signed     CAD/MEDQ  D:  10/01/2006  T:  10/01/2006  Job:  [id number]

## 2011-03-06 ENCOUNTER — Inpatient Hospital Stay (INDEPENDENT_AMBULATORY_CARE_PROVIDER_SITE_OTHER)
Admission: RE | Admit: 2011-03-06 | Discharge: 2011-03-06 | Disposition: A | Discharge status: Home / Self Care | Payer: Self-pay | Source: Ambulatory Visit | Attending: Family Medicine | Admitting: Family Medicine

## 2011-03-06 DIAGNOSIS — N390 Urinary tract infection, site not specified: Secondary | ICD-10-CM

## 2011-03-06 LAB — POCT URINALYSIS DIP (DEVICE)
Bilirubin Urine: NEGATIVE
Glucose, UA: NEGATIVE mg/dL
Ketones, ur: NEGATIVE mg/dL
Nitrite: NEGATIVE
Protein, ur: 100 mg/dL — AB
Specific Gravity, Urine: 1.025 (ref 1.005–1.030)
Urobilinogen, UA: 0.2 mg/dL (ref 0.0–1.0)
pH: 5.5 (ref 5.0–8.0)

## 2011-03-29 ENCOUNTER — Inpatient Hospital Stay (INDEPENDENT_AMBULATORY_CARE_PROVIDER_SITE_OTHER)
Admission: RE | Admit: 2011-03-29 | Discharge: 2011-03-29 | Disposition: A | Discharge status: Home / Self Care | Payer: Medicaid Other | Source: Ambulatory Visit | Attending: Family Medicine | Admitting: Family Medicine

## 2011-03-29 DIAGNOSIS — N390 Urinary tract infection, site not specified: Secondary | ICD-10-CM

## 2011-03-29 LAB — POCT URINALYSIS DIP (DEVICE)
Bilirubin Urine: NEGATIVE
Glucose, UA: NEGATIVE mg/dL
Ketones, ur: NEGATIVE mg/dL
Nitrite: NEGATIVE
Protein, ur: NEGATIVE mg/dL
Specific Gravity, Urine: 1.01 (ref 1.005–1.030)
Urobilinogen, UA: 0.2 mg/dL (ref 0.0–1.0)
pH: 5 (ref 5.0–8.0)

## 2011-03-29 LAB — POCT PREGNANCY, URINE: Preg Test, Ur: NEGATIVE

## 2011-03-31 LAB — URINE CULTURE
Colony Count: >=100000
Culture  Setup Time: 201207300022

## 2011-04-22 ENCOUNTER — Inpatient Hospital Stay (INDEPENDENT_AMBULATORY_CARE_PROVIDER_SITE_OTHER)
Admission: RE | Admit: 2011-04-22 | Discharge: 2011-04-22 | Disposition: A | Discharge status: Home / Self Care | Payer: Self-pay | Source: Ambulatory Visit | Attending: Family Medicine | Admitting: Family Medicine

## 2011-04-22 DIAGNOSIS — N390 Urinary tract infection, site not specified: Secondary | ICD-10-CM

## 2011-04-22 LAB — POCT URINALYSIS DIP (DEVICE)
Bilirubin Urine: NEGATIVE
Glucose, UA: NEGATIVE mg/dL
Ketones, ur: NEGATIVE mg/dL
Nitrite: NEGATIVE
Protein, ur: 30 mg/dL — AB
Specific Gravity, Urine: 1.015 (ref 1.005–1.030)
Urobilinogen, UA: 0.2 mg/dL (ref 0.0–1.0)
pH: 5.5 (ref 5.0–8.0)

## 2011-04-24 LAB — URINE CULTURE
Colony Count: >=100000
Culture  Setup Time: 201208221935

## 2011-05-26 LAB — CULTURE, ROUTINE-ABSCESS

## 2011-06-01 LAB — POCT URINALYSIS DIP (DEVICE)
Bilirubin Urine: NEGATIVE
Glucose, UA: >=1000 — AB
Ketones, ur: NEGATIVE
Nitrite: POSITIVE — AB
Operator id: 235561
Protein, ur: >=300 — AB
Specific Gravity, Urine: 1.02
Urobilinogen, UA: 1
pH: 7

## 2011-06-01 LAB — POCT PREGNANCY, URINE: Preg Test, Ur: NEGATIVE

## 2011-06-01 LAB — GLUCOSE, CAPILLARY: Glucose-Capillary: 258 — ABNORMAL HIGH

## 2011-06-11 LAB — COMPREHENSIVE METABOLIC PANEL
ALT: 16
AST: 17
Albumin: 3.5
Alkaline Phosphatase: 81
BUN: 11
CO2: 26
Calcium: 9.2
Chloride: 103
Creatinine, Ser: 0.68
GFR calc Af Amer: >60
GFR calc non Af Amer: >60
Glucose, Bld: 233 — ABNORMAL HIGH
Potassium: 3.8
Sodium: 138
Total Bilirubin: 0.5
Total Protein: 6.6

## 2011-06-11 LAB — CBC
HCT: 43.1
Hemoglobin: 14.8
MCHC: 34.3
MCV: 85.1
Platelets: 208
RBC: 5.07
RDW: 13.4
WBC: 9.9

## 2011-06-11 LAB — HCG, SERUM, QUALITATIVE: Preg, Serum: NEGATIVE

## 2011-06-16 LAB — BASIC METABOLIC PANEL
BUN: 9
CO2: 24
Calcium: 9.3
Chloride: 103
Creatinine, Ser: 0.75
GFR calc Af Amer: >60
GFR calc non Af Amer: >60
Glucose, Bld: 310 — ABNORMAL HIGH
Potassium: 3.8
Sodium: 136

## 2011-06-16 LAB — CULTURE, ROUTINE-ABSCESS

## 2011-06-16 LAB — CBC
HCT: 41.1
Hemoglobin: 14.4
MCHC: 34.9
MCV: 83
Platelets: 157
RBC: 4.95
RDW: 13.1
WBC: 7.6

## 2011-06-16 LAB — URINALYSIS, ROUTINE W REFLEX MICROSCOPIC
Bilirubin Urine: NEGATIVE
Glucose, UA: >1000 — AB
Nitrite: NEGATIVE
Protein, ur: 30 — AB
Specific Gravity, Urine: 1.042 — ABNORMAL HIGH
Urobilinogen, UA: 0.2
pH: 5.5

## 2011-06-16 LAB — DIFFERENTIAL
Basophils Absolute: 0
Basophils Relative: 1
Eosinophils Absolute: 0
Eosinophils Relative: 1
Lymphocytes Relative: 40
Lymphs Abs: 3.1
Monocytes Absolute: 0.5
Monocytes Relative: 6
Neutro Abs: 4
Neutrophils Relative %: 53

## 2011-06-16 LAB — URINE MICROSCOPIC-ADD ON

## 2011-06-16 LAB — ANAEROBIC CULTURE

## 2011-06-16 LAB — PREGNANCY, URINE: Preg Test, Ur: NEGATIVE

## 2011-10-03 ENCOUNTER — Emergency Department (HOSPITAL_COMMUNITY): Payer: Self-pay

## 2011-10-03 ENCOUNTER — Encounter (HOSPITAL_COMMUNITY): Payer: Self-pay | Admitting: Emergency Medicine

## 2011-10-03 ENCOUNTER — Inpatient Hospital Stay (HOSPITAL_COMMUNITY)
Admission: EM | Admit: 2011-10-03 | Discharge: 2011-10-04 | DRG: 343 | Disposition: A | Discharge status: Home / Self Care | Payer: Self-pay | Attending: General Surgery | Admitting: General Surgery

## 2011-10-03 DIAGNOSIS — D72829 Elevated white blood cell count, unspecified: Secondary | ICD-10-CM

## 2011-10-03 DIAGNOSIS — K37 Unspecified appendicitis: Secondary | ICD-10-CM

## 2011-10-03 DIAGNOSIS — R109 Unspecified abdominal pain: Secondary | ICD-10-CM | POA: Diagnosis present

## 2011-10-03 DIAGNOSIS — K3580 Unspecified acute appendicitis: Principal | ICD-10-CM | POA: Diagnosis present

## 2011-10-03 DIAGNOSIS — R1031 Right lower quadrant pain: Secondary | ICD-10-CM

## 2011-10-03 DIAGNOSIS — D72828 Other elevated white blood cell count: Secondary | ICD-10-CM

## 2011-10-03 DIAGNOSIS — I10 Essential (primary) hypertension: Secondary | ICD-10-CM | POA: Diagnosis present

## 2011-10-03 DIAGNOSIS — E119 Type 2 diabetes mellitus without complications: Secondary | ICD-10-CM | POA: Diagnosis present

## 2011-10-03 HISTORY — DX: Other specified postprocedural states: Z98.890

## 2011-10-03 HISTORY — DX: Sleep apnea, unspecified: G47.30

## 2011-10-03 HISTORY — DX: Other specified postprocedural states: R11.2

## 2011-10-03 HISTORY — DX: Essential (primary) hypertension: I10

## 2011-10-03 HISTORY — DX: Major depressive disorder, single episode, unspecified: F32.9

## 2011-10-03 HISTORY — DX: Unspecified osteoarthritis, unspecified site: M19.90

## 2011-10-03 HISTORY — DX: Depression, unspecified: F32.A

## 2011-10-03 LAB — CBC
HCT: 40.4 % (ref 36.0–46.0)
Hemoglobin: 14.2 g/dL (ref 12.0–15.0)
MCH: 29.7 pg (ref 26.0–34.0)
MCHC: 35.1 g/dL (ref 30.0–36.0)
MCV: 84.5 fL (ref 78.0–100.0)
Platelets: 229 10*3/uL (ref 150–400)
RBC: 4.78 MIL/uL (ref 3.87–5.11)
RDW: 12.7 % (ref 11.5–15.5)
WBC: 17.9 10*3/uL — ABNORMAL HIGH (ref 4.0–10.5)

## 2011-10-03 LAB — COMPREHENSIVE METABOLIC PANEL
ALT: 34 U/L (ref 0–35)
AST: 15 U/L (ref 0–37)
Albumin: 4.1 g/dL (ref 3.5–5.2)
Alkaline Phosphatase: 77 U/L (ref 39–117)
BUN: 22 mg/dL (ref 6–23)
CO2: 22 mEq/L (ref 19–32)
Calcium: 10.1 mg/dL (ref 8.4–10.5)
Chloride: 99 mEq/L (ref 96–112)
Creatinine, Ser: 0.77 mg/dL (ref 0.50–1.10)
GFR calc Af Amer: >90 mL/min (ref >90)
GFR calc non Af Amer: >90 mL/min (ref >90)
Glucose, Bld: 216 mg/dL — ABNORMAL HIGH (ref 70–99)
Potassium: 3.6 mEq/L (ref 3.5–5.1)
Sodium: 134 mEq/L — ABNORMAL LOW (ref 135–145)
Total Bilirubin: 0.3 mg/dL (ref 0.3–1.2)
Total Protein: 7.9 g/dL (ref 6.0–8.3)

## 2011-10-03 LAB — DIFFERENTIAL
Basophils Absolute: 0 10*3/uL (ref 0.0–0.1)
Basophils Relative: 0 % (ref 0–1)
Eosinophils Absolute: 0 10*3/uL (ref 0.0–0.7)
Eosinophils Relative: 0 % (ref 0–5)
Lymphocytes Relative: 16 % (ref 12–46)
Lymphs Abs: 2.8 10*3/uL (ref 0.7–4.0)
Monocytes Absolute: 0.7 10*3/uL (ref 0.1–1.0)
Monocytes Relative: 4 % (ref 3–12)
Neutro Abs: 14.3 10*3/uL — ABNORMAL HIGH (ref 1.7–7.7)
Neutrophils Relative %: 80 % — ABNORMAL HIGH (ref 43–77)

## 2011-10-03 LAB — URINALYSIS, MICROSCOPIC ONLY
Bilirubin Urine: NEGATIVE
Glucose, UA: NEGATIVE mg/dL
Hgb urine dipstick: NEGATIVE
Ketones, ur: NEGATIVE mg/dL
Leukocytes, UA: NEGATIVE
Nitrite: NEGATIVE
Protein, ur: NEGATIVE mg/dL
Specific Gravity, Urine: 1.022 (ref 1.005–1.030)
Urobilinogen, UA: 0.2 mg/dL (ref 0.0–1.0)
pH: 6 (ref 5.0–8.0)

## 2011-10-03 LAB — LIPASE, BLOOD: Lipase: 71 U/L — ABNORMAL HIGH (ref 11–59)

## 2011-10-03 LAB — POCT PREGNANCY, URINE: Preg Test, Ur: NEGATIVE

## 2011-10-03 MED ORDER — HYDROMORPHONE HCL PF 1 MG/ML IJ SOLN
1.0000 mg | Freq: Once | INTRAMUSCULAR | Status: AC
  Administered 2011-10-03: 1 mg via INTRAVENOUS
  Filled 2011-10-03: qty 1

## 2011-10-03 MED ORDER — SODIUM CHLORIDE 0.9 % IV SOLN
Freq: Once | INTRAVENOUS | Status: AC
  Administered 2011-10-03: 21:00:00 via INTRAVENOUS

## 2011-10-03 MED ORDER — ONDANSETRON HCL 4 MG/2ML IJ SOLN
4.0000 mg | Freq: Once | INTRAMUSCULAR | Status: AC
  Administered 2011-10-03 – 2011-10-04 (×2): 4 mg via INTRAVENOUS
  Filled 2011-10-03: qty 2

## 2011-10-03 MED ORDER — IOHEXOL 300 MG/ML  SOLN: 100.0000 mL | Freq: Once | INTRAMUSCULAR | Status: AC | PRN

## 2011-10-03 NOTE — ED Provider Notes (Signed)
History     CSN: [id number]  Arrival date & time 10/03/11  1820   First MD Initiated Contact with Patient 10/03/11 1955      Chief Complaint  Patient presents with  . Abdominal Pain    (Consider location/radiation/quality/duration/timing/severity/associated sxs/prior treatment) HPI Comments: Patient presents to the emergency department due to onset of right-sided lower abdominal discomfort. Patient reports that she has a history of kidney stones but reports that this pain does not feel like a kidney stone. She reports that she ate lunch around noon and the onset of pain was approximately 2 PM. She did have a loose bowel movement and reports that she has a sensation to have more stooling, but has not been able to produce any further stool. She denies dysuria or hematuria. She denies fever or chills. She has had nausea. She reports no flank pain. She denies any GYN symptoms. She reports that she has a history of cholecystectomy in the past. She denies any pain, pleurisy. She denies any recent upper respiratory infectious type symptoms.  Patient is a 41 y.o. female presenting with abdominal pain. The history is provided by the patient.  Abdominal Pain The primary symptoms of the illness include abdominal pain, nausea and diarrhea. The primary symptoms of the illness do not include fever or dysuria.  Symptoms associated with the illness do not include chills.    Past Medical History  Diagnosis Date  . Hypertension   . Diabetes mellitus     Past Surgical History  Procedure Date  . Cholecystectomy     History reviewed. No pertinent family history.  History  Substance Use Topics  . Smoking status: Not on file  . Smokeless tobacco: Current User  . Alcohol Use: No    OB History    Grav Para Term Preterm Abortions TAB SAB Ect Mult Living                  Review of Systems  Constitutional: Positive for appetite change. Negative for fever and chills.  Gastrointestinal: Positive  for nausea, abdominal pain and diarrhea.  Genitourinary: Negative for dysuria and flank pain.  All other systems reviewed and are negative.    Allergies  Review of patient's allergies indicates no known allergies.  Home Medications   Current Outpatient Rx  Name Route Sig Dispense Refill  . GLIPIZIDE 10 MG PO TABS Oral Take 10 mg by mouth 2 (two) times daily before a meal.    . LISINOPRIL 10 MG PO TABS Oral Take 20 mg by mouth daily.    Marland Kitchen METFORMIN HCL ER (MOD) 1000 MG PO TB24 Oral Take 1,000 mg by mouth 2 (two) times daily with a meal.    . RANITIDINE HCL 150 MG PO TABS Oral Take 150 mg by mouth 2 (two) times daily.      BP 117/65  Pulse 89  Temp(Src) 98.1 F (36.7 C) (Oral)  Resp 22  SpO2 98%  LMP 10/03/2011  Physical Exam  Nursing note and vitals reviewed. Constitutional: She is oriented to person, place, and time. She appears well-developed and well-nourished.  HENT:  Head: Normocephalic.  Eyes: Pupils are equal, round, and reactive to light. No scleral icterus.  Neck: Neck supple.  Pulmonary/Chest: No respiratory distress.  Abdominal: Soft. Normal appearance. There is tenderness. There is guarding and tenderness at McBurney's point. There is no CVA tenderness and negative Murphy's sign.  Musculoskeletal: She exhibits no edema and no tenderness.  Neurological: She is alert and oriented to  person, place, and time.  Skin: Skin is warm and dry. No rash noted. No pallor.  Psychiatric: She has a normal mood and affect.    ED Course  Procedures (including critical care time)  Labs Reviewed  URINALYSIS, WITH MICROSCOPIC - Abnormal; Notable for the following:    Bacteria, UA FEW (*)    Squamous Epithelial / LPF FEW (*)    All other components within normal limits  CBC - Abnormal; Notable for the following:    WBC 17.9 (*)    All other components within normal limits  DIFFERENTIAL - Abnormal; Notable for the following:    Neutrophils Relative 80 (*)    Neutro Abs 14.3  (*)    All other components within normal limits  COMPREHENSIVE METABOLIC PANEL - Abnormal; Notable for the following:    Sodium 134 (*)    Glucose, Bld 216 (*)    All other components within normal limits  LIPASE, BLOOD - Abnormal; Notable for the following:    Lipase 71 (*)    All other components within normal limits  POCT PREGNANCY, URINE   No results found.   No diagnosis found.    MDM  Pt is given IV dilaudid which did initially improve pain, however became worse again.  Pt with tenderness at McBurney's point, CT scan with contrast ordered and is pending.  Pt goes to [facility] for PCP.  WBC is elevated at 18.  Pt is signed out to Dr. Karma Ganja for follow up of CT scan.          Gavin Pound. Oletta Lamas, MD 10/03/11 2342

## 2011-10-03 NOTE — ED Notes (Signed)
Per pt increased RLQ pain with nausea no vomiting-positive diarrhea

## 2011-10-03 NOTE — ED Notes (Addendum)
Patient c/o RLQ abdominal pain since 2pm.  Pain was acute onset with nausea

## 2011-10-04 ENCOUNTER — Encounter (HOSPITAL_COMMUNITY): Payer: Self-pay | Admitting: *Deleted

## 2011-10-04 ENCOUNTER — Emergency Department (HOSPITAL_COMMUNITY): Payer: Self-pay | Admitting: Anesthesiology

## 2011-10-04 ENCOUNTER — Encounter (HOSPITAL_COMMUNITY): Payer: Self-pay | Admitting: Anesthesiology

## 2011-10-04 ENCOUNTER — Encounter (HOSPITAL_COMMUNITY)
Admission: EM | Disposition: A | Discharge status: Home / Self Care | Payer: Self-pay | Source: Home / Self Care | Attending: General Surgery

## 2011-10-04 ENCOUNTER — Other Ambulatory Visit (INDEPENDENT_AMBULATORY_CARE_PROVIDER_SITE_OTHER): Payer: Self-pay | Admitting: General Surgery

## 2011-10-04 DIAGNOSIS — K3580 Unspecified acute appendicitis: Secondary | ICD-10-CM

## 2011-10-04 HISTORY — PX: LAPAROSCOPIC APPENDECTOMY: SHX408

## 2011-10-04 LAB — GLUCOSE, CAPILLARY
Glucose-Capillary: 189 mg/dL — ABNORMAL HIGH (ref 70–99)
Glucose-Capillary: 235 mg/dL — ABNORMAL HIGH (ref 70–99)

## 2011-10-04 SURGERY — APPENDECTOMY, LAPAROSCOPIC
Anesthesia: General | Site: Abdomen | Wound class: Contaminated

## 2011-10-04 MED ORDER — FENTANYL CITRATE 0.05 MG/ML IJ SOLN: 25.0000 ug | INTRAMUSCULAR | Status: DC | PRN

## 2011-10-04 MED ORDER — LIDOCAINE-EPINEPHRINE 1 %-1:100000 IJ SOLN
INTRAMUSCULAR | Status: DC | PRN
  Administered 2011-10-04: 17 mL

## 2011-10-04 MED ORDER — ENOXAPARIN SODIUM 40 MG/0.4ML ~~LOC~~ SOLN
40.0000 mg | SUBCUTANEOUS | Status: DC
  Administered 2011-10-04: 40 mg via SUBCUTANEOUS
  Filled 2011-10-04: qty 0.4

## 2011-10-04 MED ORDER — ERTAPENEM SODIUM 1 G IJ SOLR
1.0000 g | Freq: Once | INTRAMUSCULAR | Status: AC
  Administered 2011-10-04: 1 g via INTRAVENOUS
  Filled 2011-10-04: qty 1

## 2011-10-04 MED ORDER — PROMETHAZINE HCL 25 MG/ML IJ SOLN
6.2500 mg | INTRAMUSCULAR | Status: DC | PRN
  Filled 2011-10-04: qty 1

## 2011-10-04 MED ORDER — BUPIVACAINE HCL 0.25 % IJ SOLN
INTRAMUSCULAR | Status: DC | PRN
  Administered 2011-10-04: 17 mL

## 2011-10-04 MED ORDER — ROCURONIUM BROMIDE 100 MG/10ML IV SOLN
INTRAVENOUS | Status: DC | PRN
  Administered 2011-10-04: 25 mg via INTRAVENOUS

## 2011-10-04 MED ORDER — ONDANSETRON HCL 4 MG PO TABS: 4.0000 mg | ORAL_TABLET | Freq: Four times a day (QID) | ORAL | Status: DC | PRN

## 2011-10-04 MED ORDER — KCL IN DEXTROSE-NACL 20-5-0.45 MEQ/L-%-% IV SOLN
INTRAVENOUS | Status: DC
  Administered 2011-10-04: 07:00:00 via INTRAVENOUS
  Filled 2011-10-04 (×3): qty 1000

## 2011-10-04 MED ORDER — PROPOFOL 10 MG/ML IV BOLUS
INTRAVENOUS | Status: DC | PRN
  Administered 2011-10-04: 150 mg via INTRAVENOUS

## 2011-10-04 MED ORDER — ACETAMINOPHEN 10 MG/ML IV SOLN
INTRAVENOUS | Status: DC | PRN
  Administered 2011-10-04: 1000 mg via INTRAVENOUS

## 2011-10-04 MED ORDER — ONDANSETRON HCL 4 MG/2ML IJ SOLN
4.0000 mg | Freq: Once | INTRAMUSCULAR | Status: AC
  Administered 2011-10-04: 4 mg via INTRAVENOUS
  Filled 2011-10-04: qty 2

## 2011-10-04 MED ORDER — LISINOPRIL 20 MG PO TABS
20.0000 mg | ORAL_TABLET | Freq: Every day | ORAL | Status: DC
  Administered 2011-10-04: 20 mg via ORAL
  Filled 2011-10-04: qty 1

## 2011-10-04 MED ORDER — MIDAZOLAM HCL 5 MG/5ML IJ SOLN
INTRAMUSCULAR | Status: DC | PRN
  Administered 2011-10-04: 2 mg via INTRAVENOUS

## 2011-10-04 MED ORDER — NEOSTIGMINE METHYLSULFATE 1 MG/ML IJ SOLN
INTRAMUSCULAR | Status: DC | PRN
  Administered 2011-10-04: 4 mg via INTRAVENOUS

## 2011-10-04 MED ORDER — HYDROCODONE-ACETAMINOPHEN 5-325 MG PO TABS: 1.0000 | ORAL_TABLET | ORAL | Status: DC | PRN

## 2011-10-04 MED ORDER — SCOPOLAMINE 1 MG/3DAYS TD PT72
MEDICATED_PATCH | TRANSDERMAL | Status: AC
  Filled 2011-10-04: qty 1

## 2011-10-04 MED ORDER — MORPHINE SULFATE 2 MG/ML IJ SOLN: 2.0000 mg | INTRAMUSCULAR | Status: DC | PRN

## 2011-10-04 MED ORDER — GLIPIZIDE 10 MG PO TABS
10.0000 mg | ORAL_TABLET | Freq: Two times a day (BID) | ORAL | Status: DC
  Administered 2011-10-04: 10 mg via ORAL
  Filled 2011-10-04 (×2): qty 1

## 2011-10-04 MED ORDER — METFORMIN HCL ER 500 MG PO TB24: 1000.0000 mg | ORAL_TABLET | Freq: Two times a day (BID) | ORAL | Status: DC

## 2011-10-04 MED ORDER — LACTATED RINGERS IV SOLN: INTRAVENOUS | Status: DC

## 2011-10-04 MED ORDER — HYDROMORPHONE HCL PF 1 MG/ML IJ SOLN
1.0000 mg | Freq: Once | INTRAMUSCULAR | Status: AC
  Administered 2011-10-04: 1 mg via INTRAVENOUS
  Filled 2011-10-04: qty 1

## 2011-10-04 MED ORDER — DEXTROSE-NACL 5-0.45 % IV SOLN: INTRAVENOUS | Status: DC

## 2011-10-04 MED ORDER — ONDANSETRON HCL 4 MG/2ML IJ SOLN
INTRAMUSCULAR | Status: AC
  Filled 2011-10-04: qty 2

## 2011-10-04 MED ORDER — METFORMIN HCL ER 500 MG PO TB24
1000.0000 mg | ORAL_TABLET | Freq: Two times a day (BID) | ORAL | Status: DC
Start: 2011-10-04 — End: 2011-10-04

## 2011-10-04 MED ORDER — PROMETHAZINE HCL 25 MG/ML IJ SOLN
12.5000 mg | Freq: Four times a day (QID) | INTRAMUSCULAR | Status: DC | PRN
  Administered 2011-10-04: 07:00:00 via INTRAVENOUS
  Filled 2011-10-04: qty 1

## 2011-10-04 MED ORDER — ONDANSETRON HCL 4 MG/2ML IJ SOLN: 4.0000 mg | Freq: Four times a day (QID) | INTRAMUSCULAR | Status: DC | PRN

## 2011-10-04 MED ORDER — HYDROCODONE-ACETAMINOPHEN 10-325 MG PO TABS: 1.0000 | ORAL_TABLET | Freq: Four times a day (QID) | ORAL | Status: DC | PRN

## 2011-10-04 MED ORDER — LIDOCAINE-EPINEPHRINE 1 %-1:100000 IJ SOLN
INTRAMUSCULAR | Status: AC
  Filled 2011-10-04: qty 1

## 2011-10-04 MED ORDER — LACTATED RINGERS IV SOLN
INTRAVENOUS | Status: DC | PRN
  Administered 2011-10-04 (×2): via INTRAVENOUS

## 2011-10-04 MED ORDER — METFORMIN HCL ER 500 MG PO TB24
1000.0000 mg | ORAL_TABLET | Freq: Two times a day (BID) | ORAL | Status: DC
  Filled 2011-10-04 (×2): qty 2

## 2011-10-04 MED ORDER — FAMOTIDINE 20 MG PO TABS
20.0000 mg | ORAL_TABLET | Freq: Two times a day (BID) | ORAL | Status: DC
  Administered 2011-10-04: 20 mg via ORAL
  Filled 2011-10-04 (×2): qty 1

## 2011-10-04 MED ORDER — FENTANYL CITRATE 0.05 MG/ML IJ SOLN
INTRAMUSCULAR | Status: DC | PRN
  Administered 2011-10-04 (×3): 50 ug via INTRAVENOUS
  Administered 2011-10-04: 100 ug via INTRAVENOUS

## 2011-10-04 MED ORDER — SUCCINYLCHOLINE CHLORIDE 20 MG/ML IJ SOLN
INTRAMUSCULAR | Status: DC | PRN
  Administered 2011-10-04: 100 mg via INTRAVENOUS

## 2011-10-04 MED ORDER — IOHEXOL 300 MG/ML  SOLN
100.0000 mL | Freq: Once | INTRAMUSCULAR | Status: AC | PRN
  Administered 2011-10-04: 100 mL via INTRAVENOUS

## 2011-10-04 MED ORDER — SCOPOLAMINE 1 MG/3DAYS TD PT72
MEDICATED_PATCH | TRANSDERMAL | Status: DC | PRN
  Administered 2011-10-04: 1 via TRANSDERMAL

## 2011-10-04 MED ORDER — METOCLOPRAMIDE HCL 5 MG/ML IJ SOLN
10.0000 mg | Freq: Once | INTRAMUSCULAR | Status: AC
  Administered 2011-10-04: 10 mg via INTRAVENOUS
  Filled 2011-10-04: qty 2

## 2011-10-04 MED ORDER — BUPIVACAINE HCL (PF) 0.25 % IJ SOLN
INTRAMUSCULAR | Status: AC
  Filled 2011-10-04: qty 30

## 2011-10-04 MED ORDER — GLYCOPYRROLATE 0.2 MG/ML IJ SOLN
INTRAMUSCULAR | Status: DC | PRN
  Administered 2011-10-04: .5 mg via INTRAVENOUS

## 2011-10-04 MED ORDER — ACETAMINOPHEN 10 MG/ML IV SOLN
INTRAVENOUS | Status: AC
  Filled 2011-10-04: qty 100

## 2011-10-04 SURGICAL SUPPLY — 41 items
ADH SKN CLS APL DERMABOND .7 (GAUZE/BANDAGES/DRESSINGS) ×1
BAG SPEC RTRVL LRG 6X4 10 (ENDOMECHANICALS) ×1
CABLE HIGH FREQUENCY MONO STRZ (ELECTRODE) ×2 IMPLANT
CANISTER SUCTION 2500CC (MISCELLANEOUS) ×2 IMPLANT
CHLORAPREP W/TINT 26ML (MISCELLANEOUS) ×2 IMPLANT
CLOTH BEACON ORANGE TIMEOUT ST (SAFETY) ×2 IMPLANT
COVER SURGICAL LIGHT HANDLE (MISCELLANEOUS) ×2 IMPLANT
DECANTER SPIKE VIAL GLASS SM (MISCELLANEOUS) ×1 IMPLANT
DERMABOND ADVANCED (GAUZE/BANDAGES/DRESSINGS) ×1
DERMABOND ADVANCED .7 DNX12 (GAUZE/BANDAGES/DRESSINGS) ×1 IMPLANT
DRAPE LAPAROSCOPIC ABDOMINAL (DRAPES) ×2 IMPLANT
ELECT CAUTERY BLADE 6.4 (BLADE) ×2 IMPLANT
ELECT REM PT RETURN 9FT ADLT (ELECTROSURGICAL) ×2
ELECTRODE REM PT RTRN 9FT ADLT (ELECTROSURGICAL) ×1 IMPLANT
ENDO GIA UNIVERSAL XLG (ENDOMECHANICALS) ×2 IMPLANT
FILTER SMOKE EVAC LAPAROSHD (FILTER) IMPLANT
GLOVE BIOGEL PI IND STRL 7.0 (GLOVE) ×1 IMPLANT
GLOVE BIOGEL PI INDICATOR 7.0 (GLOVE) ×1
GLOVE SURG SS PI 7.5 STRL IVOR (GLOVE) ×8 IMPLANT
GOWN PREVENTION PLUS LG XLONG (DISPOSABLE) ×2 IMPLANT
GOWN STRL NON-REIN LRG LVL3 (GOWN DISPOSABLE) ×2 IMPLANT
GOWN STRL REIN XL XLG (GOWN DISPOSABLE) ×3 IMPLANT
GRASPER LAPSCPC 5X35 EPIX (ENDOMECHANICALS) IMPLANT
KIT BASIN OR (CUSTOM PROCEDURE TRAY) ×2 IMPLANT
NS IRRIG 1000ML POUR BTL (IV SOLUTION) ×2 IMPLANT
PENCIL BUTTON HOLSTER BLD 10FT (ELECTRODE) ×2 IMPLANT
POUCH SPECIMEN RETRIEVAL 10MM (ENDOMECHANICALS) ×2 IMPLANT
RELOAD EGIA 45 MED/THCK PURPLE (STAPLE) ×2 IMPLANT
RELOAD EGIA 45 TAN VASC (STAPLE) ×1 IMPLANT
SCALPEL HARMONIC ACE (MISCELLANEOUS) ×1 IMPLANT
SCISSORS LAP 5X35 DISP (ENDOMECHANICALS) ×2 IMPLANT
SET IRRIG TUBING LAPAROSCOPIC (IRRIGATION / IRRIGATOR) ×1 IMPLANT
SLEEVE Z-THREAD 5X100MM (TROCAR) ×2 IMPLANT
SOLUTION ANTI FOG 6CC (MISCELLANEOUS) ×2 IMPLANT
SUT MNCRL AB 4-0 PS2 18 (SUTURE) ×2 IMPLANT
TOWEL OR 17X26 10 PK STRL BLUE (TOWEL DISPOSABLE) ×2 IMPLANT
TRAY FOLEY CATH 14FRSI W/METER (CATHETERS) ×2 IMPLANT
TRAY LAP CHOLE (CUSTOM PROCEDURE TRAY) ×2 IMPLANT
TROCAR BALLN 12MMX100 BLUNT (TROCAR) ×2 IMPLANT
TROCAR Z-THREAD FIOS 5X100MM (TROCAR) ×2 IMPLANT
TUBING INSUFFLATION 10FT LAP (TUBING) ×2 IMPLANT

## 2011-10-04 NOTE — Anesthesia Postprocedure Evaluation (Signed)
  Anesthesia Post-op Note  Patient: [name]  Procedure(s) Performed:  APPENDECTOMY LAPAROSCOPIC  Patient Location: PACU  Anesthesia Type: General  Level of Consciousness: awake, alert , oriented and patient cooperative  Airway and Oxygen Therapy: Patient Spontanous Breathing and Patient connected to face mask oxygen  Post-op Pain: none  Post-op Assessment: Post-op Vital signs reviewed, Patient's Cardiovascular Status Stable, Respiratory Function Stable, Patent Airway, No signs of Nausea or vomiting and Pain level controlled  Post-op Vital Signs: Reviewed and stable  Complications: No apparent anesthesia complications

## 2011-10-04 NOTE — Brief Op Note (Signed)
10/03/2011 - 10/04/2011  5:34 AM  PATIENT:  [name]  41 y.o. female  PRE-OPERATIVE DIAGNOSIS:  acute appendicitis  POST-OPERATIVE DIAGNOSIS:  acute appendicitis  PROCEDURE:  Procedure(s): APPENDECTOMY LAPAROSCOPIC  SURGEON:  Surgeon(s): Rulon Abide, DO  PHYSICIAN ASSISTANT:   ASSISTANTS: none   ANESTHESIA:   general  EBL:  Total I/O In: 1000 [I.V.:1000] Out: 110 [Urine:100; Blood:10]  BLOOD ADMINISTERED:none  DRAINS: none   LOCAL MEDICATIONS USED:  MARCAINE 17CC and LIDOCAINE 17CC  SPECIMEN:  Source of Specimen:  appendix  DISPOSITION OF SPECIMEN:  PATHOLOGY  COUNTS:  YES  TOURNIQUET:  * No tourniquets in log *  DICTATION: .Other Dictation: Dictation Number [id number]  PLAN OF CARE: Admit for overnight observation  PATIENT DISPOSITION:  PACU - hemodynamically stable.   Delay start of Pharmacological VTE agent (>24hrs) due to surgical blood loss or risk of bleeding:  {YES/NO/NOT APPLICABLE:20182

## 2011-10-04 NOTE — H&P (Signed)
Reason for Consult:appendicitis Referring Physician: Cyrah Mclamb is an 41 y.o. female.  HPI: this patient presents with a 12 hour history of acute onset of right lower cautery and abdominal pain. She was in her normal state of health until Saturday afternoon after eating lunch. She ate lunch and then began having some right lower quadrant abdominal pain which has gradually. Her grafts throughout the evening. She describes as as localized to the right lower quadrant and is sharp, constant and severe. She had associated nausea and an episode of vomiting in the emergency room. She has not measured any fevers although she does feel cold and like she has to chills. She has some irritable bowels and sewed the diarrhea that she has had recently is not unusual for her. She denies any urinary symptoms. Positive for anorexia.  Past Medical History  Diagnosis Date  . Hypertension   . Diabetes mellitus     Past Surgical History  Procedure Date  . Cholecystectomy     History reviewed. No pertinent family history.  Social History:  does not have a smoking history on file. She uses smokeless tobacco. She reports that she does not drink alcohol or use illicit drugs.  Allergies: No Known Allergies  Medications: I have reviewed the patient's current medications.  Results for orders placed during the hospital encounter of 10/03/11 (from the past 48 hour(s))  POCT PREGNANCY, URINE     Status: Normal   Collection Time   10/03/11  8:24 PM      Component Value Range Comment   Preg Test, Ur NEGATIVE  NEGATIVE    CBC     Status: Abnormal   Collection Time   10/03/11  8:30 PM      Component Value Range Comment   WBC 17.9 (*) 4.0 - 10.5 (K/uL)    RBC 4.78  3.87 - 5.11 (MIL/uL)    Hemoglobin 14.2  12.0 - 15.0 (g/dL)    HCT 16.1  09.6 - 04.5 (%)    MCV 84.5  78.0 - 100.0 (fL)    MCH 29.7  26.0 - 34.0 (pg)    MCHC 35.1  30.0 - 36.0 (g/dL)    RDW 40.9  81.1 - 91.4 (%)    Platelets 229  150 -  400 (K/uL)   DIFFERENTIAL     Status: Abnormal   Collection Time   10/03/11  8:30 PM      Component Value Range Comment   Neutrophils Relative 80 (*) 43 - 77 (%)    Neutro Abs 14.3 (*) 1.7 - 7.7 (K/uL)    Lymphocytes Relative 16  12 - 46 (%)    Lymphs Abs 2.8  0.7 - 4.0 (K/uL)    Monocytes Relative 4  3 - 12 (%)    Monocytes Absolute 0.7  0.1 - 1.0 (K/uL)    Eosinophils Relative 0  0 - 5 (%)    Eosinophils Absolute 0.0  0.0 - 0.7 (K/uL)    Basophils Relative 0  0 - 1 (%)    Basophils Absolute 0.0  0.0 - 0.1 (K/uL)   COMPREHENSIVE METABOLIC PANEL     Status: Abnormal   Collection Time   10/03/11  8:30 PM      Component Value Range Comment   Sodium 134 (*) 135 - 145 (mEq/L)    Potassium 3.6  3.5 - 5.1 (mEq/L)    Chloride 99  96 - 112 (mEq/L)    CO2 22  19 - 32 (  mEq/L)    Glucose, Bld 216 (*) 70 - 99 (mg/dL)    BUN 22  6 - 23 (mg/dL)    Creatinine, Ser 1.61  0.50 - 1.10 (mg/dL)    Calcium 09.6  8.4 - 10.5 (mg/dL)    Total Protein 7.9  6.0 - 8.3 (g/dL)    Albumin 4.1  3.5 - 5.2 (g/dL)    AST 15  0 - 37 (U/L)    ALT 34  0 - 35 (U/L)    Alkaline Phosphatase 77  39 - 117 (U/L)    Total Bilirubin 0.3  0.3 - 1.2 (mg/dL)    GFR calc non Af Amer >90  >90 (mL/min)    GFR calc Af Amer >90  >90 (mL/min)   LIPASE, BLOOD     Status: Abnormal   Collection Time   10/03/11  8:30 PM      Component Value Range Comment   Lipase 71 (*) 11 - 59 (U/L)   URINALYSIS, WITH MICROSCOPIC     Status: Abnormal   Collection Time   10/03/11  8:37 PM      Component Value Range Comment   Color, Urine YELLOW  YELLOW     APPearance CLEAR  CLEAR     Specific Gravity, Urine 1.022  1.005 - 1.030     pH 6.0  5.0 - 8.0     Glucose, UA NEGATIVE  NEGATIVE (mg/dL)    Hgb urine dipstick NEGATIVE  NEGATIVE     Bilirubin Urine NEGATIVE  NEGATIVE     Ketones, ur NEGATIVE  NEGATIVE (mg/dL)    Protein, ur NEGATIVE  NEGATIVE (mg/dL)    Urobilinogen, UA 0.2  0.0 - 1.0 (mg/dL)    Nitrite NEGATIVE  NEGATIVE     Leukocytes, UA  NEGATIVE  NEGATIVE     WBC, UA 0-2  <3 (WBC/hpf)    Bacteria, UA FEW (*) RARE     Squamous Epithelial / LPF FEW (*) RARE      Ct Abdomen Pelvis W Contrast  10/04/2011  *RADIOLOGY REPORT*  Clinical Data: Nausea, right lower quadrant abdominal pain  CT ABDOMEN AND PELVIS WITH CONTRAST  Technique:  Multidetector CT imaging of the abdomen and pelvis was performed following the standard protocol during bolus administration of intravenous contrast.  Contrast: OMNIPAQUE IOHEXOL 300 MG/ML IV SOLN  Comparison: 05/11/2005  Findings: Lung bases are essentially clear.  Heart size within normal limits.  Unremarkable liver.  Status post cholecystectomy.  No biliary ductal dilatation.  Unremarkable spleen, pancreas, adrenal glands. Nonobstructing right renal stones. Subcentimeter left renal hypodensities too small to further characterize.  No hydronephrosis or hydroureter.  No bowel obstruction.  No CT evidence for colitis.  Appendix is distended up to 11 mm with periappendiceal fat stranding.  No free intraperitoneal air or loculated fluid collection.  No lymphadenopathy.  Decompressed bladder.  Unremarkable appearance of the uterus and adnexa.  There is scattered atherosclerotic calcification of the aorta and its branches. No aneurysmal dilatation.  No acute osseous abnormality.  IMPRESSION: Acute appendicitis.  Original Report Authenticated By: Waneta Martins, M.D.    @ROS @ Blood pressure 111/67, pulse 79, temperature 98.1 F (36.7 C), temperature source Oral, resp. rate 16, last menstrual period 10/03/2011, SpO2 99.00%. General appearance: alert, cooperative and no distress Head: Normocephalic, without obvious abnormality, atraumatic Neck: no JVD and supple, symmetrical, trachea midline Resp: nonlabored Cardio: normal rate, regular rhythm GI: focal RLQ pain, with voluntary guarding, ND, upper midline incision Extremities: extremities normal, atraumatic,  no cyanosis or edema Neurologic: Grossly  normal  Assessment/Plan: Abdominal pain which is likely due to acute appendicitis. She has focal right lower quadrant tenderness, elevated white blood cell count, and CT scan consistent with acute appendicitis. I discussed with her the options for observation versus surgery for appendicitis and she elected to proceed with surgery. I explained the risks of the procedure including infection, bleeding, pain, scarring, need for open surgery, negative laparoscopy, abscess, need for bowel resection, and staple line leaks and she expressed understanding and desires to proceed with appendectomy.  Lodema Pilot DAVID 10/04/2011, 2:17 AM

## 2011-10-04 NOTE — Anesthesia Preprocedure Evaluation (Addendum)
Anesthesia Evaluation  Patient identified by MRN, date of birth, ID band Patient awake    Reviewed: Allergy & Precautions, H&P , NPO status , Patient's Chart, lab work & pertinent test results  Airway Mallampati: III TM Distance: >3 FB     Dental  (+) Teeth Intact and Dental Advisory Given   Pulmonary neg pulmonary ROS,  clear to auscultation  Pulmonary exam normal       Cardiovascular hypertension, Pt. on medications Regular Normal    Neuro/Psych Negative Neurological ROS  Negative Psych ROS   GI/Hepatic negative GI ROS, Neg liver ROS,   Endo/Other  Diabetes mellitus-, Well Controlled, Type 2, Oral Hypoglycemic AgentsMorbid obesity  Renal/GU negative Renal ROS  Genitourinary negative   Musculoskeletal negative musculoskeletal ROS (+)   Abdominal (+) obese,   Peds negative pediatric ROS (+)  Hematology negative hematology ROS (+)   Anesthesia Other Findings   Reproductive/Obstetrics negative OB ROS                          Anesthesia Physical Anesthesia Plan  ASA: III and Emergent  Anesthesia Plan: General   Post-op Pain Management:    Induction: Intravenous  Airway Management Planned: Oral ETT  Additional Equipment:   Intra-op Plan:   Post-operative Plan: Extubation in OR  Informed Consent: I have reviewed the patients History and Physical, chart, labs and discussed the procedure including the risks, benefits and alternatives for the proposed anesthesia with the patient or authorized representative who has indicated his/her understanding and acceptance.   Dental advisory given  Plan Discussed with: CRNA  Anesthesia Plan Comments:         Anesthesia Quick Evaluation

## 2011-10-04 NOTE — ED Notes (Signed)
Called OR regarding patient and OR will call back when they are ready for patient. Patient updated

## 2011-10-04 NOTE — Discharge Summary (Signed)
Physician Discharge Summary  Patient ID: [name] MRN: [id number] DOB/AGE: [birth year] 41 y.o.  Admit date: 10/03/2011 Discharge date: 10/04/2011  Admission Diagnoses:  Acute Appendicitis  Discharge Diagnoses:   Same Active Problems:  * No active hospital problems. *    Discharged Condition: good  Hospital Course: She underwent and laparoscopic appendectomy and tolerated it well.  She was discharged on postop day #1.  Discharge instructions were given to her.  She was given Norco for pain.  Consults: None  Significant Diagnostic Studies: radiology: CT of abd/pelvis  Treatments: surgery: laparoscopic appendectomy  Discharge Exam: Blood pressure 114/65, pulse 82, temperature 98.4 F (36.9 C), temperature source Oral, resp. rate 18, height 5' 9.5" (1.765 m), weight 235 lb (106.595 kg), last menstrual period 10/03/2011, SpO2 98.00%.   Disposition: Home or Self Care   Medication List  As of 10/04/2011  3:03 PM   TAKE these medications         glipiZIDE 10 MG tablet   Commonly known as: GLUCOTROL   Take 10 mg by mouth 2 (two) times daily before a meal.      lisinopril 10 MG tablet   Commonly known as: PRINIVIL,ZESTRIL   Take 20 mg by mouth daily.      metFORMIN 1000 MG (MOD) 24 hr tablet   Commonly known as: GLUMETZA   Take 1,000 mg by mouth 2 (two) times daily with a meal.      ranitidine 150 MG tablet   Commonly known as: ZANTAC   Take 150 mg by mouth 2 (two) times daily.           Follow-up Information    Follow up with [facility]- she will call to make an appoint         Signed: Khamiyah Grefe J 10/04/2011, 3:03 PM

## 2011-10-04 NOTE — Preoperative (Signed)
Beta Blockers   Reason not to administer Beta Blockers:Not Applicable 

## 2011-10-04 NOTE — Op Note (Signed)
NAMETERRICKA, Meghan NO.:  [id number]  MEDICAL RECORD NO.:  [id number]  LOCATION:  1537                         FACILITY:  [facility]  PHYSICIAN:  Lodema Pilot, MD       DATE OF BIRTH:  08-05-1971  DATE OF PROCEDURE:  10/04/2011 DATE OF DISCHARGE:                              OPERATIVE REPORT   PROCEDURE:  Laparoscopic appendectomy.  PREOPERATIVE DIAGNOSIS:  Acute appendicitis.  POSTOPERATIVE DIAGNOSIS:  Acute appendicitis.  SURGEON:  Lodema Pilot, MD  ASSISTANT:  None.  ANESTHESIA:  General, endotracheal anesthesia with 34 cc of 1% lidocaine with epinephrine and 0.25% Marcaine in a 50:50 mixture.  FLUIDS:  1 L crystalloid.  ESTIMATED BLOOD LOSS:  Minimal.  DRAINS:  None.  SPECIMENS:  Appendix, sent to Pathology for permanent sectioning.  COMPLICATIONS:  None apparent.  INDICATIONS:  [name] is a 41 year old female with a 12-hour history of increasing right lower quadrant pain.  She had white count and focal tenderness in the right lower quadrant consistent with acute appendicitis.  OPERATIVE DETAILS:  [name] was seen and evaluated in the emergency room and risks and benefits of procedure were discussed in lay terms and informed consent was obtained and prophylactic antibiotics were given. She was taken to the operating room, placed on the table in a supine position and general endotracheal anesthesia was obtained.  Foley catheter was placed in the left arm, was tucked and she was secured to the table.  Her abdomen was prepped and draped in a standard surgical fashion.  An infraumbilical incision was made in the skin and dissection carried down to the subcutaneous tissue using blunt dissection.  The abdominal wall fascia was elevated and sharply incised and the peritoneum entered bluntly.  A 12-mm balloon port was placed at the umbilicus and pneumoperitoneum was obtained.  A laparoscope was introduced and there was no evidence of bowel injury  upon entry.  A left lower quadrant 5-mm trocar was placed under direct visualization and a right upper quadrant trocar was placed under direct visualization.  She had some upper abdominal midline adhesions that were not in the way of trocar placement.  The right lower quadrant was explored and her appendix was thickened and had a small amount of fibrinous exudate on the distal appendix.  Base of the appendix appeared normal.  The small bowel was retracted out of the right lower quadrant and the appendix was elevated and a window was created to the mesoappendix.  Then, appendix was transected at its base with Endo-GIA purple staple load and the staples appeared well-formed and the staple line was hemostatic.  Then, the mesoappendix was divided close to the appendix using a Harmonic Scalpel and hemostasis was adequate.  The appendix was placed in an EndoCatch bag and removed from the umbilical trocar site.  It was passed off the table and sent to Pathology for permanent sectioning.  The right lower quadrant was suctioned and again, the staple lines appeared hemostatic and well-formed.  There was no evidence of perforation or purulence in the abdomen.  The left lower quadrant trocar was removed under direct visualization and abdominal wall hemostasis was noted to be adequate.  The  umbilical trocar was removed and the fascia was approximated with interrupted 0 Vicryl sutures and the sutures were secured and the abdomen was re-insufflated through the right upper quadrant trocar site.  The abdominal wall closure was noted to be adequate without any evidence of bowel injury. Staple lines appeared hemostatic and the abdomen was again noted to be hemostatic without any evidence of bowel injury.  The gas was removed and the right upper quadrant trocar was removed and the wounds were injected with 34 cc of 1% lidocaine with epinephrine and 0.25% Marcaine in a 50:50 mixture, and the skin edges were  approximated with a 4-0 Monocryl subcuticular suture.  Skin was washed and dried and Dermabond was applied.  All sponge and instrument counts were correct at the end of the case.  The patient tolerated the procedure well without apparent complications.          ______________________________ Lodema Pilot, MD     BL/MEDQ  D:  10/04/2011  T:  10/04/2011  Job:  [id number]

## 2011-10-04 NOTE — Progress Notes (Signed)
Day of Surgery  Subjective: Feels "sore" but feels better.  Nausea improved.  Objective: Vital signs in last 24 hours: Temp:  [96.7 F (35.9 C)-98.1 F (36.7 C)] 97.6 F (36.4 C) (02/03 0621) Pulse Rate:  [63-97] 89  (02/03 0621) Resp:  [13-22] 20  (02/03 0621) BP: (96-132)/(29-98) 127/85 mmHg (02/03 0621) SpO2:  [95 %-100 %] 96 % (02/03 0621) Weight:  [235 lb (106.595 kg)] 235 lb (106.595 kg) (02/03 0626) Last BM Date: 10/03/11  Intake/Output from previous day: 02/02 0701 - 02/03 0700 In: 1100 [I.V.:1100] Out: 110 [Urine:100; Blood:10] Intake/Output this shift:    General appearance: alert, cooperative and no distress Resp: nonlabored Cardio: normal rate regular rhythm GI: soft, minimal incisional tenderness, wounds okay, ND  Lab Results:   Basename 10/03/11 2030  WBC 17.9*  HGB 14.2  HCT 40.4  PLT 229   BMET  Basename 10/03/11 2030  NA 134*  K 3.6  CL 99  CO2 22  GLUCOSE 216*  BUN 22  CREATININE 0.77  CALCIUM 10.1   PT/INR No results found for this basename: LABPROT:2,INR:2 in the last 72 hours ABG No results found for this basename: PHART:2,PCO2:2,PO2:2,HCO3:2 in the last 72 hours  Studies/Results: Ct Abdomen Pelvis W Contrast  10/04/2011  *RADIOLOGY REPORT*  Clinical Data: Nausea, right lower quadrant abdominal pain  CT ABDOMEN AND PELVIS WITH CONTRAST  Technique:  Multidetector CT imaging of the abdomen and pelvis was performed following the standard protocol during bolus administration of intravenous contrast.  Contrast: OMNIPAQUE IOHEXOL 300 MG/ML IV SOLN  Comparison: 05/11/2005  Findings: Lung bases are essentially clear.  Heart size within normal limits.  Unremarkable liver.  Status post cholecystectomy.  No biliary ductal dilatation.  Unremarkable spleen, pancreas, adrenal glands. Nonobstructing right renal stones. Subcentimeter left renal hypodensities too small to further characterize.  No hydronephrosis or hydroureter.  No bowel obstruction.   No CT evidence for colitis.  Appendix is distended up to 11 mm with periappendiceal fat stranding.  No free intraperitoneal air or loculated fluid collection.  No lymphadenopathy.  Decompressed bladder.  Unremarkable appearance of the uterus and adnexa.  There is scattered atherosclerotic calcification of the aorta and its branches. No aneurysmal dilatation.  No acute osseous abnormality.  IMPRESSION: Acute appendicitis.  Original Report Authenticated By: Waneta Martins, M.D.    Anti-infectives: Anti-infectives     Start     Dose/Rate Route Frequency Ordered Stop   10/04/11 0230   ertapenem (INVANZ) 1 g in sodium chloride 0.9 % 50 mL IVPB        1 g 100 mL/hr over 30 Minutes Intravenous  Once 10/04/11 0223 10/04/11 0307          Assessment/Plan: s/p Procedure(s): APPENDECTOMY LAPAROSCOPIC Advance diet She should be okay for discharge later today if tolerating regular diet and pain control continues to be okay.  LOS: 1 day    Lodema Pilot DAVID 10/04/2011

## 2011-10-04 NOTE — ED Notes (Signed)
Spoke to RN on [facility] about patient and updated on patient.  Advised that patient is going to OR first then [facility]

## 2011-10-04 NOTE — Progress Notes (Signed)
Patient d/c home escorted home by family.  Patient received hand written prescription for pain medication.  No problems reported.  Received d/c instructions.

## 2011-10-04 NOTE — ED Notes (Signed)
Patient transported to CT 

## 2011-10-04 NOTE — Transfer of Care (Signed)
Immediate Anesthesia Transfer of Care Note  Patient: [name]  Procedure(s) Performed:  APPENDECTOMY LAPAROSCOPIC  Patient Location: PACU  Anesthesia Type: General  Level of Consciousness: awake, alert , oriented and patient cooperative  Airway & Oxygen Therapy: Patient Spontanous Breathing and Patient connected to face mask oxygen  Post-op Assessment: Report given to PACU RN and Post -op Vital signs reviewed and stable  Post vital signs: Reviewed and stable  Complications: No apparent anesthesia complications

## 2011-10-04 NOTE — ED Provider Notes (Signed)
Patient signed out to me by Dr. Oletta Lamas at the end of his shift. A CT scan was pending to 2 right lower quadrant pain. Patient has a leukocytosis. CT scan result obtained and shows acute appendicitis. Patient was reexamined by me and continues to have right lower quadrant tenderness. She states she has been n.p.o. since noon yesterday. She has received some pain relief with the allotted and does not prefer to have any more pain meds at this time. She states that her diabetes and hypertension are well controlled on oral medications. I have consult to Dr. Biagio Quint of general surgery who will see the patient in the ED.  Ethelda Chick, MD 10/04/11 [phone]

## 2011-10-05 ENCOUNTER — Encounter (HOSPITAL_COMMUNITY): Payer: Self-pay | Admitting: General Surgery

## 2011-10-05 ENCOUNTER — Telehealth (INDEPENDENT_AMBULATORY_CARE_PROVIDER_SITE_OTHER): Payer: Self-pay

## 2011-10-05 NOTE — Telephone Encounter (Signed)
Pt called, home doing well except for slight swelling of neck glands. Pt states she does not have fever, or other URI symptoms just noticed she had some swelling in the glands of her upper neck. Pt advised to watch for any other symptoms such as fever,aches,sore throat and call if these appear. Pt given f/u appt.

## 2011-10-28 ENCOUNTER — Encounter (INDEPENDENT_AMBULATORY_CARE_PROVIDER_SITE_OTHER): Payer: Self-pay | Admitting: General Surgery

## 2012-05-16 ENCOUNTER — Emergency Department (INDEPENDENT_AMBULATORY_CARE_PROVIDER_SITE_OTHER)
Admission: EM | Admit: 2012-05-16 | Discharge: 2012-05-16 | Disposition: A | Discharge status: Home / Self Care | Payer: Self-pay | Source: Home / Self Care

## 2012-05-16 ENCOUNTER — Encounter (HOSPITAL_COMMUNITY): Payer: Self-pay | Admitting: *Deleted

## 2012-05-16 DIAGNOSIS — L989 Disorder of the skin and subcutaneous tissue, unspecified: Secondary | ICD-10-CM

## 2012-05-16 DIAGNOSIS — L988 Other specified disorders of the skin and subcutaneous tissue: Secondary | ICD-10-CM

## 2012-05-16 DIAGNOSIS — J069 Acute upper respiratory infection, unspecified: Secondary | ICD-10-CM

## 2012-05-16 MED ORDER — FLUCONAZOLE 150 MG PO TABS: ORAL_TABLET | ORAL | Status: DC

## 2012-05-16 MED ORDER — AMOXICILLIN-POT CLAVULANATE 875-125 MG PO TABS: 1.0000 | ORAL_TABLET | Freq: Two times a day (BID) | ORAL | Status: AC

## 2012-05-16 MED ORDER — FLUCONAZOLE 150 MG PO TABS: ORAL_TABLET | ORAL | Status: AC

## 2012-05-16 NOTE — ED Provider Notes (Signed)
History     CSN: [id number]  Arrival date & time 05/16/12  0831   None     Chief Complaint  Patient presents with  . URI    (Consider location/radiation/quality/duration/timing/severity/associated sxs/prior treatment) HPI Comments: For 2 weeks having PND, cough, sorethroat, head congestion and sense of a fever. Taking OTC meds and not resolved.   2. Has a sore areal on gluteal cleft and akjacent to R labia majora.  THe gluteal cleft lesion could not be located; the other is a 3-4 mm red nodule, tender. No abcess formation.    Past Medical History  Diagnosis Date  . Hypertension   . Diabetes mellitus   . PONV (postoperative nausea and vomiting)   . Sleep apnea     lost 70lbs no cpap x88yrs now  . Arthritis     fingers  . Depression     Past Surgical History  Procedure Date  . Cholecystectomy   . Cardiac catheterization     4-42yrs ago  . Appendectomy   . Laparoscopic appendectomy 10/04/2011    Procedure: APPENDECTOMY LAPAROSCOPIC;  Surgeon: Rulon Abide, DO;  Location: [facility];  Service: General;  Laterality: N/A;    Family History  Problem Relation Age of Onset  . Aneurysm Mother   . Heart attack Father   . Stroke Father     History  Substance Use Topics  . Smoking status: Current Every Day Smoker -- 1.0 packs/day for 24 years    Types: Cigarettes  . Smokeless tobacco: Current User  . Alcohol Use: Yes     occassional/social    OB History    Grav Para Term Preterm Abortions TAB SAB Ect Mult Living                  Review of Systems  Constitutional: Positive for fever. Negative for diaphoresis and activity change.  HENT: Positive for congestion, rhinorrhea and postnasal drip. Negative for nosebleeds and tinnitus.   Eyes: Negative.   Respiratory: Positive for cough and shortness of breath. Negative for chest tightness and wheezing.   Cardiovascular: Negative.   Genitourinary: Negative.   Skin: Negative.     Allergies  Codeine and Sulfa  antibiotics  Home Medications   Current Outpatient Rx  Name Route Sig Dispense Refill  . FLUOXETINE HCL 20 MG PO CAPS Oral Take 20 mg by mouth daily.    Marland Kitchen GLIPIZIDE 10 MG PO TABS Oral Take 10 mg by mouth 2 (two) times daily before a meal.    . LISINOPRIL 10 MG PO TABS Oral Take 20 mg by mouth daily.    Marland Kitchen METFORMIN HCL ER (MOD) 1000 MG PO TB24 Oral Take 1,000 mg by mouth 2 (two) times daily with a meal.    . AMOXICILLIN-POT CLAVULANATE 875-125 MG PO TABS Oral Take 1 tablet by mouth 2 (two) times daily. X 7 d 14 tablet 0  . FLUCONAZOLE 150 MG PO TABS  1 tab po every other day 4 tablet 0    BP 119/79  Pulse 79  Temp 98.1 F (36.7 C) (Oral)  Resp 18  SpO2 98%  Physical Exam  Constitutional: She is oriented to person, place, and time. She appears well-developed and well-nourished. No distress.  HENT:  Right Ear: External ear normal.  Left Ear: External ear normal.  Mouth/Throat: Oropharynx is clear and moist. No oropharyngeal exudate.       OP mildly red  Eyes: Conjunctivae normal and EOM are normal. Pupils are equal, round,  and reactive to light.  Neck: Normal range of motion. Neck supple.  Cardiovascular: Normal rate and normal heart sounds.   Pulmonary/Chest: Effort normal and breath sounds normal. No respiratory distress.  Lymphadenopathy:    She has no cervical adenopathy.  Neurological: She is alert and oriented to person, place, and time.  Skin:       3-4 mm red, tender nodule, nonmobile adjacent to R labia majora. No abcess. The other lesion she described could not be located.   Psychiatric: She has a normal mood and affect.    ED Course  Procedures (including critical care time)  Labs Reviewed - No data to display No results found.   1. URI (upper respiratory infection)   2. Sebaceous papule       MDM  Use OTC med of choice for upper resp congestion Augmentin 875 bid for 7 d.  Keep area of gluteal cleft clean and dry Use OTC monistat cream for these areas  surrounding vulva and gluteal cleft        Hayden Rasmussen, NP 05/16/12 [phone]

## 2012-05-16 NOTE — ED Notes (Signed)
Pt reports a "cold" with nonproductive cough the last 2 weeks.  And she also has a small bump on her perineum that is painful

## 2012-05-17 NOTE — ED Provider Notes (Signed)
Medical screening examination/treatment/procedure(s) were performed by non-physician practitioner and as supervising physician I was immediately available for consultation/collaboration.  Luiz Blare MD   Luiz Blare, MD 05/17/12 [phone]

## 2012-07-21 ENCOUNTER — Encounter (HOSPITAL_COMMUNITY): Payer: Self-pay | Admitting: *Deleted

## 2012-07-21 ENCOUNTER — Emergency Department (HOSPITAL_COMMUNITY)
Admission: EM | Admit: 2012-07-21 | Discharge: 2012-07-21 | Disposition: A | Discharge status: Home / Self Care | Payer: Self-pay | Source: Home / Self Care

## 2012-07-21 DIAGNOSIS — E119 Type 2 diabetes mellitus without complications: Secondary | ICD-10-CM

## 2012-07-21 DIAGNOSIS — F17200 Nicotine dependence, unspecified, uncomplicated: Secondary | ICD-10-CM | POA: Insufficient documentation

## 2012-07-21 DIAGNOSIS — E1100 Type 2 diabetes mellitus with hyperosmolarity without nonketotic hyperglycemic-hyperosmolar coma (NKHHC): Secondary | ICD-10-CM | POA: Insufficient documentation

## 2012-07-21 DIAGNOSIS — I10 Essential (primary) hypertension: Secondary | ICD-10-CM

## 2012-07-21 MED ORDER — METFORMIN HCL ER (MOD) 1000 MG PO TB24: 1000.0000 mg | ORAL_TABLET | Freq: Two times a day (BID) | ORAL | Status: DC

## 2012-07-21 MED ORDER — GLIPIZIDE 10 MG PO TABS: 10.0000 mg | ORAL_TABLET | Freq: Two times a day (BID) | ORAL | Status: DC

## 2012-07-21 MED ORDER — LISINOPRIL 10 MG PO TABS: 20.0000 mg | ORAL_TABLET | Freq: Every day | ORAL | Status: DC

## 2012-07-21 MED ORDER — FLUOXETINE HCL 20 MG PO CAPS: 20.0000 mg | ORAL_CAPSULE | Freq: Every day | ORAL | Status: DC

## 2012-07-21 NOTE — ED Provider Notes (Signed)
Patient Demographics  Nyhla Deleon, is a 41 y.o. female  ZOX:[id number]  [id number]  DOB - [birth year]  Chief Complaint  Patient presents with  . Medication Refill        Subjective:   [name] today has, No headache, No chest pain, No abdominal pain - No Nausea, No new weakness tingling or numbness, No Cough - SOB.   Objective:    Filed Vitals:   07/21/12 1652  BP: 140/61  Pulse: 89  Temp: 98.6 F (37 C)  TempSrc: Oral  Resp: 20  SpO2: 100%     Exam  Awake Alert, Oriented X 3, No new F.N deficits, Normal affect [place].AT,PERRAL Supple Neck,No JVD, No cervical lymphadenopathy appriciated.  Symmetrical Chest wall movement, Good air movement bilaterally, CTAB RRR,No Gallops,Rubs or new Murmurs, No Parasternal Heave +ve B.Sounds, Abd Soft, Non tender, No organomegaly appriciated, No rebound - guarding or rigidity. No Cyanosis, Clubbing or edema, No new Rash or bruise      Data Review   CBC No results found for this basename: WBC:5,HGB:5,HCT:5,PLT:5,MCV:5,MCH:5,MCHC:5,RDW:5,NEUTRABS:5,LYMPHSABS:5,MONOABS:5,EOSABS:5,BASOSABS:5,BANDABS:5,BANDSABD:5 in the last 168 hours  Chemistries   No results found for this basename: NA:5,K:5,CL:5,CO2:5,GLUCOSE:5,BUN:5,CREATININE:5,GFRCGP,:5,CALCIUM:5,MG:5,AST:5,ALT:5,ALKPHOS:5,BILITOT:5 in the last 168 hours ------------------------------------------------------------------------------------------------------------------ No results found for this basename: HGBA1C:2 in the last 72 hours ------------------------------------------------------------------------------------------------------------------ No results found for this basename: CHOL:2,HDL:2,LDLCALC:2,TRIG:2,CHOLHDL:2,LDLDIRECT:2 in the last 72 hours ------------------------------------------------------------------------------------------------------------------ No results found for this basename: TSH,T4TOTAL,FREET3,T3FREE,THYROIDAB in the last 72  hours ------------------------------------------------------------------------------------------------------------------ No results found for this basename: VITAMINB12:2,FOLATE:2,FERRITIN:2,TIBC:2,IRON:2,RETICCTPCT:2 in the last 72 hours  Coagulation profile  No results found for this basename: INR:5,PROTIME:5 in the last 168 hours  Urinalysis    Component Value Date/Time   COLORURINE YELLOW 10/03/2011 2037   APPEARANCEUR CLEAR 10/03/2011 2037   LABSPEC 1.022 10/03/2011 2037   PHURINE 6.0 10/03/2011 2037   GLUCOSEU NEGATIVE 10/03/2011 2037   HGBUR NEGATIVE 10/03/2011 2037   BILIRUBINUR NEGATIVE 10/03/2011 2037   KETONESUR NEGATIVE 10/03/2011 2037   PROTEINUR NEGATIVE 10/03/2011 2037   UROBILINOGEN 0.2 10/03/2011 2037   NITRITE NEGATIVE 10/03/2011 2037   LEUKOCYTESUR NEGATIVE 10/03/2011 2037     Prior to Admission medications   Medication Sig Start Date End Date Taking? Authorizing Provider  FLUoxetine (PROZAC) 20 MG capsule Take 1 capsule (20 mg total) by mouth daily. 07/21/12   Leroy Sea, MD  glipiZIDE (GLUCOTROL) 10 MG tablet Take 1 tablet (10 mg total) by mouth 2 (two) times daily before a meal. 07/21/12   Leroy Sea, MD  lisinopril (PRINIVIL,ZESTRIL) 10 MG tablet Take 2 tablets (20 mg total) by mouth daily. 07/21/12   Leroy Sea, MD  metFORMIN (GLUMETZA) 1000 MG (MOD) 24 hr tablet Take 1 tablet (1,000 mg total) by mouth 2 (two) times daily with a meal. 07/21/12   Leroy Sea, MD     Assessment & Plan   Patient here for a routine followup visit to obtain her medication refills. She has no subjective complaints or problems.  For her diabetes mellitus type 2 and essential hypertension home medications have been represcribed, will check the A1c, have counseled her to do q. a.c. at bedtime Accu-Cheks and maintain a logbook, will get her back in 3 weeks time to followup on A1c results along with Accu-Chek Monitoring.  Patient does smoke and has been counseled to quit  smoking.   Leroy Sea M.D on 07/21/2012 at 5:01 PM  Leroy Sea, MD 07/21/12 [phone]

## 2012-07-21 NOTE — ED Notes (Signed)
Pt reports needing med refill.  

## 2012-07-22 LAB — HEMOGLOBIN A1C
Hgb A1c MFr Bld: 7.2 % — ABNORMAL HIGH (ref <5.7)
Mean Plasma Glucose: 160 mg/dL — ABNORMAL HIGH (ref <117)

## 2012-08-11 ENCOUNTER — Emergency Department (HOSPITAL_COMMUNITY)
Admission: EM | Admit: 2012-08-11 | Discharge: 2012-08-11 | Disposition: A | Discharge status: Home / Self Care | Payer: Self-pay | Source: Home / Self Care

## 2012-08-11 ENCOUNTER — Encounter (HOSPITAL_COMMUNITY): Payer: Self-pay

## 2012-08-11 DIAGNOSIS — J111 Influenza due to unidentified influenza virus with other respiratory manifestations: Secondary | ICD-10-CM

## 2012-08-11 DIAGNOSIS — E119 Type 2 diabetes mellitus without complications: Secondary | ICD-10-CM

## 2012-08-11 DIAGNOSIS — R6889 Other general symptoms and signs: Secondary | ICD-10-CM

## 2012-08-11 LAB — INFLUENZA PANEL BY PCR (TYPE A & B)
H1N1 flu by pcr: DETECTED — AB
Influenza A By PCR: POSITIVE — AB
Influenza B By PCR: NEGATIVE

## 2012-08-11 MED ORDER — PSEUDOEPHEDRINE HCL 30 MG PO TABS: 30.0000 mg | ORAL_TABLET | Freq: Four times a day (QID) | ORAL | Status: DC | PRN

## 2012-08-11 MED ORDER — GUAIFENESIN-DM 100-10 MG/5ML PO SYRP: 5.0000 mL | ORAL_SOLUTION | Freq: Three times a day (TID) | ORAL | Status: DC | PRN

## 2012-08-11 MED ORDER — ACETAMINOPHEN 325 MG PO TABS: 650.0000 mg | ORAL_TABLET | Freq: Four times a day (QID) | ORAL | Status: DC | PRN

## 2012-08-11 NOTE — ED Notes (Signed)
Patient c/o cough fever body aches all over for past two days. Using mucinex and alka seltzer plus with no relief. Also c/o  Pain to her right elbow uncomfortable at times

## 2012-08-11 NOTE — ED Provider Notes (Addendum)
History     CSN: [id number]  Arrival date & time 08/11/12  1551   None     Chief Complaint  Patient presents with  . Influenza    flu like syptoms     HPI 41 year old female with history of diabetes mellitus on oral hypoglycemic, hypertension who presents with flulike symptoms for past 2 days. Patient started having subjective fever with chills, runny nose, nasal congestion, nonproductive cough and generalized body aches. She denies any sick contacts. She also has some mild headache. Denies blurry vision, chest pain, shortness of breath, abdominal pain, nausea, vomiting, bowel or urinary symptoms. She denies any weakness. She however does have some pain over left elbow which is worse especially on stretching the handout and lifting objects. She denies any trauma. She denies any change in weight or appetite. She was last seen 3 weeks back for medication refill and a hemoglobin A1c was done which was noted to be 7.2. She has not taken flu vaccine this season Past Medical History  Diagnosis Date  . Hypertension   . Diabetes mellitus   . PONV (postoperative nausea and vomiting)   . Sleep apnea     lost 70lbs no cpap x21yrs now  . Arthritis     fingers  . Depression     Past Surgical History  Procedure Date  . Cholecystectomy   . Cardiac catheterization     4-51yrs ago  . Appendectomy   . Laparoscopic appendectomy 10/04/2011    Procedure: APPENDECTOMY LAPAROSCOPIC;  Surgeon: Rulon Abide, DO;  Location: [facility];  Service: General;  Laterality: N/A;    Family History  Problem Relation Age of Onset  . Aneurysm Mother   . Heart attack Father   . Stroke Father     History  Substance Use Topics  . Smoking status: Current Every Day Smoker -- 1.0 packs/day for 24 years    Types: Cigarettes  . Smokeless tobacco: Current User  . Alcohol Use: Yes     Comment: occassional/social    OB History    Grav Para Term Preterm Abortions TAB SAB Ect Mult Living                   Review of Systems As outlined in history of present illness Allergies  Codeine and Sulfa antibiotics  Home Medications   Current Outpatient Rx  Name  Route  Sig  Dispense  Refill  . FLUOXETINE HCL 20 MG PO CAPS   Oral   Take 1 capsule (20 mg total) by mouth daily.   30 capsule   2   . GLIPIZIDE 10 MG PO TABS   Oral   Take 1 tablet (10 mg total) by mouth 2 (two) times daily before a meal.   60 tablet   2   . LISINOPRIL 10 MG PO TABS   Oral   Take 2 tablets (20 mg total) by mouth daily.   30 tablet   2   . METFORMIN HCL ER (MOD) 1000 MG PO TB24   Oral   Take 1 tablet (1,000 mg total) by mouth 2 (two) times daily with a meal.   60 tablet   2     BP 110/69  Pulse 110  Temp 99.8 F (37.7 C) (Oral)  Resp 19  SpO2 98%  Physical Exam Middle-aged female appears congested HEENT: No pallor,  no icterus,  nasal congestion, no sinus tenderness no cervical lymphadenopathy Chest: Clear to auscultation bilaterally moving all rhonchi CVS:  Normal S1 and S2 no murmurs Abdomen: Soft, nontender nondistended, bowel sounds present Extremities: Warm, no edema no swelling over the right elbow outward has tenderness over the lateral epicondylar area, normal range of motion CNS: AAO times ED Course  Procedures (including critical care time)   Labs Reviewed  INFLUENZA PANEL BY PCR   No results found.   No diagnosis found.  Assessment/plan Flulike symptoms We'll check for rapid flu. I will prescribe her with Tamiflu if positive. She is advised on supportive care including drinking plenty of fluids, humidifier use and steam inhalation few  times a day. will prescribe her with as needed Tylenol for fever and Sudafed..patient has mild lateral epicondylitis with minimal pain. Tylenol should be able to relieve the pain symptoms. We will call her with prescriptions if flu test positive.  Diabetes mellitus  her A1c 7.2. She is on glipizide and metformin which I will continue for  now. She will followup with [place] in 3 months to have a repeat A1c level. Her blood pressure is stable and she should continue with lisinopril.      MDM  Follow up in 3 months        Nyra Anspaugh, MD 08/11/12 1755    Called from the lab in the evening as rapid flu was positive for influenza A. I. have Called her pharmacy at [address] and filled prescription for tamiflu 75 mg bid for 5 days. Patient was called to collect her prescription. Also left a message today to  returned for followup in 1 week.   Eddie North, MD 08/12/12 1042  Patient called back this afternoon stating she has not been feeling well. i asked her to come to the ED a she has not filled out the prescription saying they were expensive. She refuses to come to ED and get admitted if needed saying she has a 43 year old handicapped son whom no one can take care of. Patient refused to come to the ED. Patient again counseled on supportive care for her symptoms and come to ED if not improved.   Eddie North, MD 08/12/12 [phone]

## 2012-08-11 NOTE — ED Notes (Signed)
[name] in lab called reporting flu result.  "flu positive a, negative b"

## 2012-08-11 NOTE — ED Notes (Signed)
Reported lab result to dr Gonzella Lex

## 2012-08-12 ENCOUNTER — Telehealth (HOSPITAL_COMMUNITY): Payer: Self-pay | Admitting: Internal Medicine

## 2012-11-01 MED ORDER — LISINOPRIL 20 MG PO TABS: 20.0000 mg | ORAL_TABLET | Freq: Every day | ORAL | Status: DC

## 2012-11-01 MED ORDER — FLUOXETINE HCL 20 MG PO CAPS: 20.0000 mg | ORAL_CAPSULE | Freq: Every day | ORAL | Status: DC

## 2012-11-01 NOTE — ED Notes (Signed)
Refills for lisinopril and prozac called into  [facility] on [address] [phone] and an appt given for Monday @ 5pm

## 2012-11-07 ENCOUNTER — Emergency Department (HOSPITAL_COMMUNITY)
Admission: EM | Admit: 2012-11-07 | Discharge: 2012-11-07 | Disposition: A | Discharge status: Home / Self Care | Payer: Self-pay | Source: Home / Self Care

## 2012-11-07 ENCOUNTER — Encounter (HOSPITAL_COMMUNITY): Payer: Self-pay

## 2012-11-07 DIAGNOSIS — F329 Major depressive disorder, single episode, unspecified: Secondary | ICD-10-CM

## 2012-11-07 DIAGNOSIS — F17200 Nicotine dependence, unspecified, uncomplicated: Secondary | ICD-10-CM

## 2012-11-07 DIAGNOSIS — E119 Type 2 diabetes mellitus without complications: Secondary | ICD-10-CM

## 2012-11-07 DIAGNOSIS — I10 Essential (primary) hypertension: Secondary | ICD-10-CM

## 2012-11-07 DIAGNOSIS — F32A Depression, unspecified: Secondary | ICD-10-CM

## 2012-11-07 DIAGNOSIS — R0982 Postnasal drip: Secondary | ICD-10-CM

## 2012-11-07 DIAGNOSIS — H9201 Otalgia, right ear: Secondary | ICD-10-CM

## 2012-11-07 MED ORDER — CIPROFLOXACIN-DEXAMETHASONE 0.3-0.1 % OT SUSP: 4.0000 [drp] | Freq: Two times a day (BID) | OTIC | Status: DC

## 2012-11-07 MED ORDER — FLUOXETINE HCL 20 MG PO CAPS: 20.0000 mg | ORAL_CAPSULE | Freq: Every day | ORAL | Status: DC

## 2012-11-07 MED ORDER — LISINOPRIL 20 MG PO TABS: 20.0000 mg | ORAL_TABLET | Freq: Every day | ORAL | Status: DC

## 2012-11-07 MED ORDER — PSEUDOEPHEDRINE HCL 30 MG PO TABS: 30.0000 mg | ORAL_TABLET | Freq: Four times a day (QID) | ORAL | Status: DC | PRN

## 2012-11-07 MED ORDER — METFORMIN HCL ER (MOD) 1000 MG PO TB24: 1000.0000 mg | ORAL_TABLET | Freq: Two times a day (BID) | ORAL | Status: DC

## 2012-11-07 MED ORDER — GLIPIZIDE 10 MG PO TABS: 10.0000 mg | ORAL_TABLET | Freq: Two times a day (BID) | ORAL | Status: DC

## 2012-11-07 NOTE — ED Notes (Signed)
Follow up- HTN and DM

## 2012-11-07 NOTE — ED Provider Notes (Signed)
History     CSN: [id number]  Arrival date & time 11/07/12  1643   None     Chief Complaint  Patient presents with  . Follow-up    (Consider location/radiation/quality/duration/timing/severity/associated sxs/prior treatment) HPI Mrs. Foulk is a 42 year old woman with a PMH of HTN, DM and depression who comes in needing RF on her medications.  She has had a recent bout of bronchitis and a nagging cough for the past 2 weeks.  She is hoarse.  She has had some sinus pressure and drainage.  Cough is productive of thick, yellow mucous.  No F/C.  Last hemoglobin A1c was 7.2% 07/21/12.    Past Medical History  Diagnosis Date  . Hypertension   . Diabetes mellitus   . PONV (postoperative nausea and vomiting)   . Sleep apnea     lost 70lbs no cpap x30yrs now  . Arthritis     fingers  . Depression     Past Surgical History  Procedure Laterality Date  . Cholecystectomy    . Cardiac catheterization      4-71yrs ago  . Appendectomy    . Laparoscopic appendectomy  10/04/2011    Procedure: APPENDECTOMY LAPAROSCOPIC;  Surgeon: Rulon Abide, DO;  Location: [facility];  Service: General;  Laterality: N/A;    Family History  Problem Relation Age of Onset  . Aneurysm Mother   . Heart attack Father   . Stroke Father     History  Substance Use Topics  . Smoking status: Current Every Day Smoker -- 1.00 packs/day for 24 years    Types: Cigarettes  . Smokeless tobacco: Current User  . Alcohol Use: Yes     Comment: occassional/social    OB History   Grav Para Term Preterm Abortions TAB SAB Ect Mult Living                  Review of Systems No excessive thirst or urination.  No chest pain.  No SOB.  No N/V/D or blood in the stools.  Weight stable.  Allergies  Codeine and Sulfa antibiotics  Home Medications   Current Outpatient Rx  Name  Route  Sig  Dispense  Refill  . acetaminophen (TYLENOL) 325 MG tablet   Oral   Take 2 tablets (650 mg total) by mouth every 6 (six) hours  as needed for pain or fever.   30 tablet   1   . FLUoxetine (PROZAC) 20 MG capsule   Oral   Take 1 capsule (20 mg total) by mouth daily.   30 capsule   0   . glipiZIDE (GLUCOTROL) 10 MG tablet   Oral   Take 1 tablet (10 mg total) by mouth 2 (two) times daily before a meal.   60 tablet   2   . guaiFENesin-dextromethorphan (ROBITUSSIN DM) 100-10 MG/5ML syrup   Oral   Take 5 mLs by mouth 3 (three) times daily as needed for cough.   118 mL   0   . lisinopril (PRINIVIL,ZESTRIL) 20 MG tablet   Oral   Take 1 tablet (20 mg total) by mouth daily.   30 tablet   0   . metFORMIN (GLUMETZA) 1000 MG (MOD) 24 hr tablet   Oral   Take 1 tablet (1,000 mg total) by mouth 2 (two) times daily with a meal.   60 tablet   2   . pseudoephedrine (SUDAFED) 30 MG tablet   Oral   Take 1 tablet (30 mg  total) by mouth every 6 (six) hours as needed for congestion.   30 tablet   0     BP 101/57  Pulse 87  Temp(Src) 97.8 F (36.6 C) (Oral)  SpO2 99%  Physical Exam General: No acute distress. HEENT: Normocephalic, atraumatic. PERRLA. EOMI. Oropharynx is clear with no tonsillar exudates. Tympanic membranes appear normal. Some mild erythema to the external right canal.  Neck: Supple, no thyromegaly, no lymphadenopathy, no jugular venous distention. Chest: Lungs clear to auscultation bilaterally with good air movement. Heart: Regular rate, and rhythm. No murmurs, rubs, or gallops. Abdomen: Soft, nontender, nondistended with normal active bowel sounds. Extremities: No clubbing, edema, or cyanosis. Skin: Warm and dry. No rashes. Psychiatric: Mood and affect normal.  ED Course  Procedures (including critical care time)  Labs Reviewed - No data to display No results found.   No diagnosis found.    MDM  1. Postnasal drip: Patient was given a refill on her prescription for Sudafed. No current indications for antibiotics but if she continues to have problems with this, would add an  antihistamine. 2. Right earache: Likely from postnasal drainage causing eustachian tube dysfunction. Sudafed should help. Her right external canal appeared a little red. We'll order Ciprodex with instructions to start this if her ear symptoms do not resolve after a few days of Sudafed. 3. DM: Appears reasonably well-controlled. Will recheck hemoglobin A1c and if it is stable, would recommend checking her hemoglobin A1c every 6 months instead of every 3 months. Her metformin and glipizide were refilled. 4. Hypertension: Reasonably well-controlled. Continue lisinopril. Given refills. 5. Depression: Patient's Prozac was refilled. 6. Smoker: Patient was encouraged at tobacco cessation efforts.        Maryruth Bun Rama, MD 11/07/12 1743

## 2012-11-08 LAB — HEMOGLOBIN A1C
Hgb A1c MFr Bld: 9.2 % — ABNORMAL HIGH (ref <5.7)
Mean Plasma Glucose: 217 mg/dL — ABNORMAL HIGH (ref <117)

## 2013-03-01 NOTE — Progress Notes (Signed)
Quick Note:  Please call patient and schedule a follow up appt to check on her DM control.  RAMA,CHRISTINA 03/01/2013 3:22 PM  ______

## 2013-03-08 ENCOUNTER — Telehealth: Payer: Self-pay

## 2013-03-08 NOTE — Telephone Encounter (Signed)
Message copied by Lestine Mount on Wed Mar 08, 2013 11:39 AM ------      Message from: RAMA, Trula Ore P      Created: Wed Mar 01, 2013  3:22 PM       Please call patient and schedule a follow up appt to check on her DM control.            RAMA,CHRISTINA      03/01/2013      3:22 PM       ------

## 2013-05-22 ENCOUNTER — Ambulatory Visit (HOSPITAL_COMMUNITY)
Admission: RE | Admit: 2013-05-22 | Discharge: 2013-05-22 | Disposition: A | Discharge status: Home / Self Care | Payer: Medicaid Other | Source: Ambulatory Visit | Attending: Nurse Practitioner | Admitting: Nurse Practitioner

## 2013-05-22 ENCOUNTER — Other Ambulatory Visit (HOSPITAL_COMMUNITY): Payer: Self-pay | Admitting: Nurse Practitioner

## 2013-05-22 DIAGNOSIS — M79671 Pain in right foot: Secondary | ICD-10-CM

## 2013-05-22 DIAGNOSIS — M79609 Pain in unspecified limb: Secondary | ICD-10-CM | POA: Insufficient documentation

## 2013-05-22 DIAGNOSIS — M7730 Calcaneal spur, unspecified foot: Secondary | ICD-10-CM | POA: Insufficient documentation

## 2013-08-28 ENCOUNTER — Other Ambulatory Visit: Payer: Self-pay | Admitting: Internal Medicine

## 2013-10-17 ENCOUNTER — Emergency Department (HOSPITAL_COMMUNITY): Payer: Medicaid Other

## 2013-10-17 ENCOUNTER — Emergency Department (HOSPITAL_COMMUNITY)
Admission: EM | Admit: 2013-10-17 | Discharge: 2013-10-17 | Disposition: A | Discharge status: Home / Self Care | Payer: Medicaid Other | Attending: Emergency Medicine | Admitting: Emergency Medicine

## 2013-10-17 ENCOUNTER — Encounter (HOSPITAL_COMMUNITY): Payer: Self-pay | Admitting: Emergency Medicine

## 2013-10-17 DIAGNOSIS — F329 Major depressive disorder, single episode, unspecified: Secondary | ICD-10-CM | POA: Insufficient documentation

## 2013-10-17 DIAGNOSIS — E119 Type 2 diabetes mellitus without complications: Secondary | ICD-10-CM | POA: Insufficient documentation

## 2013-10-17 DIAGNOSIS — R7989 Other specified abnormal findings of blood chemistry: Secondary | ICD-10-CM

## 2013-10-17 DIAGNOSIS — Z791 Long term (current) use of non-steroidal anti-inflammatories (NSAID): Secondary | ICD-10-CM | POA: Insufficient documentation

## 2013-10-17 DIAGNOSIS — F3289 Other specified depressive episodes: Secondary | ICD-10-CM | POA: Insufficient documentation

## 2013-10-17 DIAGNOSIS — Z8669 Personal history of other diseases of the nervous system and sense organs: Secondary | ICD-10-CM | POA: Insufficient documentation

## 2013-10-17 DIAGNOSIS — R944 Abnormal results of kidney function studies: Secondary | ICD-10-CM | POA: Insufficient documentation

## 2013-10-17 DIAGNOSIS — F17200 Nicotine dependence, unspecified, uncomplicated: Secondary | ICD-10-CM | POA: Insufficient documentation

## 2013-10-17 DIAGNOSIS — J40 Bronchitis, not specified as acute or chronic: Secondary | ICD-10-CM

## 2013-10-17 DIAGNOSIS — M19042 Primary osteoarthritis, left hand: Secondary | ICD-10-CM

## 2013-10-17 DIAGNOSIS — R5383 Other fatigue: Secondary | ICD-10-CM | POA: Insufficient documentation

## 2013-10-17 DIAGNOSIS — M19041 Primary osteoarthritis, right hand: Secondary | ICD-10-CM

## 2013-10-17 DIAGNOSIS — I10 Essential (primary) hypertension: Secondary | ICD-10-CM | POA: Insufficient documentation

## 2013-10-17 DIAGNOSIS — Z79899 Other long term (current) drug therapy: Secondary | ICD-10-CM | POA: Insufficient documentation

## 2013-10-17 DIAGNOSIS — R0600 Dyspnea, unspecified: Secondary | ICD-10-CM

## 2013-10-17 DIAGNOSIS — M19049 Primary osteoarthritis, unspecified hand: Secondary | ICD-10-CM | POA: Insufficient documentation

## 2013-10-17 DIAGNOSIS — R0609 Other forms of dyspnea: Secondary | ICD-10-CM

## 2013-10-17 DIAGNOSIS — R5381 Other malaise: Secondary | ICD-10-CM | POA: Insufficient documentation

## 2013-10-17 DIAGNOSIS — Z9889 Other specified postprocedural states: Secondary | ICD-10-CM | POA: Insufficient documentation

## 2013-10-17 LAB — URINALYSIS, ROUTINE W REFLEX MICROSCOPIC
Bilirubin Urine: NEGATIVE
Glucose, UA: >1000 mg/dL — AB
Hgb urine dipstick: NEGATIVE
Ketones, ur: NEGATIVE mg/dL
Leukocytes, UA: NEGATIVE
Nitrite: NEGATIVE
Protein, ur: NEGATIVE mg/dL
Specific Gravity, Urine: 1.02 (ref 1.005–1.030)
Urobilinogen, UA: 0.2 mg/dL (ref 0.0–1.0)
pH: 7 (ref 5.0–8.0)

## 2013-10-17 LAB — CBC WITH DIFFERENTIAL/PLATELET
Basophils Absolute: 0 10*3/uL (ref 0.0–0.1)
Basophils Relative: 1 % (ref 0–1)
Eosinophils Absolute: 0.1 10*3/uL (ref 0.0–0.7)
Eosinophils Relative: 1 % (ref 0–5)
HCT: 36 % (ref 36.0–46.0)
Hemoglobin: 12.4 g/dL (ref 12.0–15.0)
Lymphocytes Relative: 48 % — ABNORMAL HIGH (ref 12–46)
Lymphs Abs: 4.1 10*3/uL — ABNORMAL HIGH (ref 0.7–4.0)
MCH: 30.2 pg (ref 26.0–34.0)
MCHC: 34.4 g/dL (ref 30.0–36.0)
MCV: 87.6 fL (ref 78.0–100.0)
Monocytes Absolute: 0.4 10*3/uL (ref 0.1–1.0)
Monocytes Relative: 4 % (ref 3–12)
Neutro Abs: 4.1 10*3/uL (ref 1.7–7.7)
Neutrophils Relative %: 47 % (ref 43–77)
Platelets: 208 10*3/uL (ref 150–400)
RBC: 4.11 MIL/uL (ref 3.87–5.11)
RDW: 12.7 % (ref 11.5–15.5)
WBC: 8.7 10*3/uL (ref 4.0–10.5)

## 2013-10-17 LAB — COMPREHENSIVE METABOLIC PANEL
ALT: 20 U/L (ref 0–35)
AST: 16 U/L (ref 0–37)
Albumin: 3.3 g/dL — ABNORMAL LOW (ref 3.5–5.2)
Alkaline Phosphatase: 80 U/L (ref 39–117)
BUN: 23 mg/dL (ref 6–23)
CO2: 24 mEq/L (ref 19–32)
Calcium: 9 mg/dL (ref 8.4–10.5)
Chloride: 97 mEq/L (ref 96–112)
Creatinine, Ser: 1.11 mg/dL — ABNORMAL HIGH (ref 0.50–1.10)
GFR calc Af Amer: 69 mL/min — ABNORMAL LOW (ref >90)
GFR calc non Af Amer: 59 mL/min — ABNORMAL LOW (ref >90)
Glucose, Bld: 309 mg/dL — ABNORMAL HIGH (ref 70–99)
Potassium: 4.9 mEq/L (ref 3.7–5.3)
Sodium: 135 mEq/L — ABNORMAL LOW (ref 137–147)
Total Bilirubin: <0.2 mg/dL — ABNORMAL LOW (ref 0.3–1.2)
Total Protein: 6.7 g/dL (ref 6.0–8.3)

## 2013-10-17 LAB — GLUCOSE, CAPILLARY: Glucose-Capillary: 268 mg/dL — ABNORMAL HIGH (ref 70–99)

## 2013-10-17 LAB — URINE MICROSCOPIC-ADD ON

## 2013-10-17 LAB — SEDIMENTATION RATE: Sed Rate: 27 mm/hr — ABNORMAL HIGH (ref 0–22)

## 2013-10-17 LAB — PRO B NATRIURETIC PEPTIDE: Pro B Natriuretic peptide (BNP): 54.1 pg/mL (ref 0–125)

## 2013-10-17 LAB — POCT I-STAT TROPONIN I: Troponin i, poc: 0 ng/mL (ref 0.00–0.08)

## 2013-10-17 LAB — C-REACTIVE PROTEIN: CRP: <0.5 mg/dL — ABNORMAL LOW (ref <0.60)

## 2013-10-17 MED ORDER — PREDNISONE 20 MG PO TABS: 40.0000 mg | ORAL_TABLET | Freq: Every day | ORAL | Status: DC

## 2013-10-17 MED ORDER — MELOXICAM 15 MG PO TABS: 15.0000 mg | ORAL_TABLET | Freq: Every day | ORAL | Status: DC

## 2013-10-17 NOTE — ED Notes (Addendum)
Bilateral hand swelling for a couple of days now hurts all the way up baoth arms states just started using patch for smoking x 1 week ago pt is sob on exertion pt is diabetic but has not checked her sugar for a while she out of strips

## 2013-10-17 NOTE — ED Notes (Signed)
CBG [id number]

## 2013-10-17 NOTE — Discharge Instructions (Signed)
Your work up was negative for an emergent cause of your symptoms. No concern for infection or rheumatism. Your oxygen saturations remained normal even while walking.  Please folllow up with your primary care doctor for further work up.  Bronchitis Bronchitis is inflammation of the airways that extend from the windpipe into the lungs (bronchi). The inflammation often causes mucus to develop, which leads to a cough. If the inflammation becomes severe, it may cause shortness of breath. CAUSES  Bronchitis may be caused by:   Viral infections.   Bacteria.   Cigarette smoke.   Allergens, pollutants, and other irritants.  SIGNS AND SYMPTOMS  The most common symptom of bronchitis is a frequent cough that produces mucus. Other symptoms include:  Fever.   Body aches.   Chest congestion.   Chills.   Shortness of breath.   Sore throat.  DIAGNOSIS  Bronchitis is usually diagnosed through a medical history and physical exam. Tests, such as chest X-rays, are sometimes done to rule out other conditions.  TREATMENT  You may need to avoid contact with whatever caused the problem (smoking, for example). Medicines are sometimes needed. These may include:  Antibiotics. These may be prescribed if the condition is caused by bacteria.  Cough suppressants. These may be prescribed for relief of cough symptoms.   Inhaled medicines. These may be prescribed to help open your airways and make it easier for you to breathe.   Steroid medicines. These may be prescribed for those with recurrent (chronic) bronchitis. HOME CARE INSTRUCTIONS  Get plenty of rest.   Drink enough fluids to keep your urine clear or pale yellow (unless you have a medical condition that requires fluid restriction). Increasing fluids may help thin your secretions and will prevent dehydration.   Only take over-the-counter or prescription medicines as directed by your health care provider.  Only take antibiotics as  directed. Make sure you finish them even if you start to feel better.  Avoid secondhand smoke, irritating chemicals, and strong fumes. These will make bronchitis worse. If you are a smoker, quit smoking. Consider using nicotine gum or skin patches to help control withdrawal symptoms. Quitting smoking will help your lungs heal faster.   Put a cool-mist humidifier in your bedroom at night to moisten the air. This may help loosen mucus. Change the water in the humidifier daily. You can also run the hot water in your shower and sit in the bathroom with the door closed for 5 10 minutes.   Follow up with your health care provider as directed.   Wash your hands frequently to avoid catching bronchitis again or spreading an infection to others.  SEEK MEDICAL CARE IF: Your symptoms do not improve after 1 week of treatment.  SEEK IMMEDIATE MEDICAL CARE IF:  Your fever increases.  You have chills.   You have chest pain.   You have worsening shortness of breath.   You have bloody sputum.  You faint.  You have lightheadedness.  You have a severe headache.   You vomit repeatedly. MAKE SURE YOU:   Understand these instructions.  Will watch your condition.  Will get help right away if you are not doing well or get worse. Document Released: 08/17/2005 Document Revised: 06/07/2013 Document Reviewed: 04/11/2013 [facility] Patient Information 2014 [place], [place].  Shortness of Breath Shortness of breath means you have trouble breathing. Shortness of breath may indicate that you have a medical problem. You should seek immediate medical care for shortness of breath. CAUSES   Not  enough oxygen in the air (as with high altitudes or a smoke-filled room).  Short-term (acute) lung disease, including:  Infections, such as pneumonia.  Fluid in the lungs, such as heart failure.  A blood clot in the lungs (pulmonary embolism).  Long-term (chronic) lung diseases.  Heart disease (heart  attack, angina, heart failure, and others).  Low red blood cells (anemia).  Poor physical fitness. This can cause shortness of breath when you exercise.  Chest or back injuries or stiffness.  Being overweight.  Smoking.  Anxiety. This can make you feel like you are not getting enough air. DIAGNOSIS  Serious medical problems can usually be found during your physical exam. Tests may also be done to determine why you are having shortness of breath. Tests may include:  Chest X-rays.  Lung function tests.  Blood tests.  Electrocardiography.  Exercise testing.  Echocardiography.  Imaging scans. Your caregiver may not be able to find a cause for your shortness of breath after your exam. In this case, it is important to have a follow-up exam with your caregiver as directed.  TREATMENT  Treatment for shortness of breath depends on the cause of your symptoms and can vary greatly. HOME CARE INSTRUCTIONS   Do not smoke. Smoking is a common cause of shortness of breath. If you smoke, ask for help to quit.  Avoid being around chemicals or things that may bother your breathing, such as paint fumes and dust.  Rest as needed. Slowly resume your usual activities.  If medicines were prescribed, take them as directed for the full length of time directed. This includes oxygen and any inhaled medicines.  Keep all follow-up appointments as directed by your caregiver. SEEK MEDICAL CARE IF:   Your condition does not improve in the time expected.  You have a hard time doing your normal activities even with rest.  You have any side effects or problems with the medicines prescribed.  You develop any new symptoms. SEEK IMMEDIATE MEDICAL CARE IF:   Your shortness of breath gets worse.  You feel lightheaded, faint, or develop a cough not controlled with medicines.  You start coughing up blood.  You have pain with breathing.  You have chest pain or pain in your arms, shoulders, or  abdomen.  You have a fever.  You are unable to walk up stairs or exercise the way you normally do. MAKE SURE YOU:  Understand these instructions.  Will watch your condition.  Will get help right away if you are not doing well or get worse. Document Released: 05/12/2001 Document Revised: 02/16/2012 Document Reviewed: 11/02/2011 [facility] Patient Information 2014 [place]. Arthritis, Nonspecific Arthritis is inflammation of a joint. This usually means pain, redness, warmth or swelling are present. One or more joints may be involved. There are a number of types of arthritis. Your caregiver may not be able to tell what type of arthritis you have right away. CAUSES  The most common cause of arthritis is the wear and tear on the joint (osteoarthritis). This causes damage to the cartilage, which can break down over time. The knees, hips, back and neck are most often affected by this type of arthritis. Other types of arthritis and common causes of joint pain include:  Sprains and other injuries near the joint. Sometimes minor sprains and injuries cause pain and swelling that develop hours later.  Rheumatoid arthritis. This affects hands, feet and knees. It usually affects both sides of your body at the same time. It is often  associated with chronic ailments, fever, weight loss and general weakness.  Crystal arthritis. Gout and pseudo gout can cause occasional acute severe pain, redness and swelling in the foot, ankle, or knee.  Infectious arthritis. Bacteria can get into a joint through a break in overlying skin. This can cause infection of the joint. Bacteria and viruses can also spread through the blood and affect your joints.  Drug, infectious and allergy reactions. Sometimes joints can become mildly painful and slightly swollen with these types of illnesses. SYMPTOMS   Pain is the main symptom.  Your joint or joints can also be red, swollen and warm or hot to the touch.  You may  have a fever with certain types of arthritis, or even feel overall ill.  The joint with arthritis will hurt with movement. Stiffness is present with some types of arthritis. DIAGNOSIS  Your caregiver will suspect arthritis based on your description of your symptoms and on your exam. Testing may be needed to find the type of arthritis:  Blood and sometimes urine tests.  X-ray tests and sometimes CT or MRI scans.  Removal of fluid from the joint (arthrocentesis) is done to check for bacteria, crystals or other causes. Your caregiver (or a specialist) will numb the area over the joint with a local anesthetic, and use a needle to remove joint fluid for examination. This procedure is only minimally uncomfortable.  Even with these tests, your caregiver may not be able to tell what kind of arthritis you have. Consultation with a specialist (rheumatologist) may be helpful. TREATMENT  Your caregiver will discuss with you treatment specific to your type of arthritis. If the specific type cannot be determined, then the following general recommendations may apply. Treatment of severe joint pain includes:  Rest.  Elevation.  Anti-inflammatory medication (for example, ibuprofen) may be prescribed. Avoiding activities that cause increased pain.  Only take over-the-counter or prescription medicines for pain and discomfort as recommended by your caregiver.  Cold packs over an inflamed joint may be used for 10 to 15 minutes every hour. Hot packs sometimes feel better, but do not use overnight. Do not use hot packs if you are diabetic without your caregiver's permission.  A cortisone shot into arthritic joints may help reduce pain and swelling.  Any acute arthritis that gets worse over the next 1 to 2 days needs to be looked at to be sure there is no joint infection. Long-term arthritis treatment involves modifying activities and lifestyle to reduce joint stress jarring. This can include weight loss.  Also, exercise is needed to nourish the joint cartilage and remove waste. This helps keep the muscles around the joint strong. HOME CARE INSTRUCTIONS   Do not take aspirin to relieve pain if gout is suspected. This elevates uric acid levels.  Only take over-the-counter or prescription medicines for pain, discomfort or fever as directed by your caregiver.  Rest the joint as much as possible.  If your joint is swollen, keep it elevated.  Use crutches if the painful joint is in your leg.  Drinking plenty of fluids may help for certain types of arthritis.  Follow your caregiver's dietary instructions.  Try low-impact exercise such as:  Swimming.  Water aerobics.  Biking.  Walking.  Morning stiffness is often relieved by a warm shower.  Put your joints through regular range-of-motion. SEEK MEDICAL CARE IF:   You do not feel better in 24 hours or are getting worse.  You have side effects to medications, or are not  getting better with treatment. SEEK IMMEDIATE MEDICAL CARE IF:   You have a fever.  You develop severe joint pain, swelling or redness.  Many joints are involved and become painful and swollen.  There is severe back pain and/or leg weakness.  You have loss of bowel or bladder control. Document Released: 09/24/2004 Document Revised: 11/09/2011 Document Reviewed: 10/10/2008 [facility] Patient Information 2014 [place], [place].

## 2013-10-17 NOTE — ED Provider Notes (Signed)
CSN: [id number]     Arrival date & time 10/17/13  1441 History   First MD Initiated Contact with Patient 10/17/13 1447     Chief Complaint  Patient presents with  . Hand Pain     (Consider location/radiation/quality/duration/timing/severity/associated sxs/prior Treatment) HPI  This is a 43 year old female with a 60-pack-year smoking history recent diagnosis of pneumonia treated outpatient with Levaquin presents emergency today for dyspnea on exertion and hand pain.  The patient states that she was diagnosed with pneumonia 2 weeks ago by her primary care physician.  She states that since that time she has had unchanged dyspnea with exertion.  Patient states that walking from the front of the emergency department to the back of pot the caused her to feel dizzy and weak.  She states that approximately 3 weeks for her pneumonia she would've been able to get around without any issues.  Patient it denies any peripheral edema, racing or skipping heart, hematochezia or melena, feelings of presyncope.  She denies any unilateral leg swelling, history of DVT, chest pain, or positional dyspnea.  Patient denies any wheezing or chest tightness.  The patient also complains of bilateral hand pain.  She notices swelling and stiffness in her hands as she weeks which is better throughout the day.  She denies any heat, redness, fever or pain involving any of her other joints.  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     Past Medical History  Diagnosis Date  . Hypertension   . Diabetes mellitus   . PONV (postoperative nausea and vomiting)   . Sleep apnea     lost 70lbs no cpap x46yrs now  . Arthritis     fingers  . Depression    Past Surgical History  Procedure Laterality Date  . Cholecystectomy    . Cardiac catheterization      4-54yrs ago  . Appendectomy    . Laparoscopic appendectomy  10/04/2011    Procedure: APPENDECTOMY LAPAROSCOPIC;  Surgeon: Rulon Abide, DO;  Location: [facility];  Service: General;  Laterality: N/A;   Family History  Problem Relation Age of Onset  . Aneurysm Mother   . Heart attack Father   . Stroke Father    History  Substance Use Topics  . Smoking status: Current Every Day Smoker -- 1.00 packs/day for 24 years    Types: Cigarettes  . Smokeless tobacco: Current User  . Alcohol Use: Yes     Comment: occassional/social   OB History   Grav Para Term Preterm Abortions TAB SAB Ect Mult Living                 Review of Systems  Constitutional: Negative for fever and chills.  HENT: Negative for congestion.   Eyes: Negative for visual disturbance.  Respiratory: Positive for shortness of breath.   Cardiovascular: Negative for chest pain, palpitations and leg swelling.  Gastrointestinal: Negative for nausea, vomiting and blood in stool.  Genitourinary: Negative for dysuria.  Musculoskeletal: Positive for arthralgias and joint swelling.  Skin: Negative for rash.  Neurological: Positive for weakness.      Allergies  Codeine and Sulfa antibiotics  Home Medications   Current Outpatient Rx  Name  Route  Sig  Dispense  Refill  . acetaminophen (TYLENOL) 325 MG tablet   Oral   Take 2 tablets (650 mg total) by mouth every 6 (six) hours as needed for pain or fever.   30  tablet   1   . albuterol (PROVENTIL HFA;VENTOLIN HFA) 108 (90 BASE) MCG/ACT inhaler   Inhalation   Inhale 2 puffs into the lungs every 6 (six) hours.         Marland Kitchen FLUoxetine (PROZAC) 40 MG capsule   Oral   Take 40 mg by mouth daily.         Marland Kitchen glipiZIDE (GLUCOTROL) 10 MG tablet   Oral   Take 1 tablet (10 mg total) by mouth 2 (two) times daily before a meal.   60 tablet   6   . lisinopril (PRINIVIL,ZESTRIL) 20 MG tablet   Oral   Take 1 tablet (20 mg total) by mouth daily.   30 tablet   6   . meloxicam (MOBIC) 15 MG tablet   Oral   Take 15 mg by mouth daily.         . metFORMIN (GLUMETZA) 1000 MG (MOD) 24 hr tablet   Oral    Take 1 tablet (1,000 mg total) by mouth 2 (two) times daily with a meal.   60 tablet   6   . nicotine (NICODERM CQ - DOSED IN MG/24 HOURS) 21 mg/24hr patch   Transdermal   Place 21 mg onto the skin daily.          BP 104/65  Pulse 98  Temp(Src) 98.3 F (36.8 C) (Oral)  Resp 14  SpO2 98% Physical Exam  Constitutional: She is oriented to person, place, and time. She appears well-developed and well-nourished. No distress.  HENT:  Head: Normocephalic and atraumatic.  Eyes: Conjunctivae and EOM are normal. Pupils are equal, round, and reactive to light. No scleral icterus.  Neck: Normal range of motion. No JVD present. No tracheal deviation present.  Cardiovascular: Normal rate, regular rhythm and normal heart sounds.  Exam reveals no gallop and no friction rub.   No murmur heard. Pulmonary/Chest: Effort normal and breath sounds normal. No respiratory distress.  Abdominal: Soft. Bowel sounds are normal. She exhibits no distension and no mass. There is no tenderness. There is no guarding.  Musculoskeletal: Normal range of motion. She exhibits tenderness. She exhibits no edema.  Tenderness in BL hands. ROM, Strength, motion intact.   Neurological: She is alert and oriented to person, place, and time.  Skin: Skin is warm and dry. No rash noted. She is not diaphoretic.  Psychiatric: Her behavior is normal.    ED Course  Procedures (including critical care time) Labs Review Labs Reviewed  GLUCOSE, CAPILLARY - Abnormal; Notable for the following:    Glucose-Capillary 268 (*)    All other components within normal limits  CBC WITH DIFFERENTIAL - Abnormal; Notable for the following:    Lymphocytes Relative 48 (*)    Lymphs Abs 4.1 (*)    All other components within normal limits  COMPREHENSIVE METABOLIC PANEL - Abnormal; Notable for the following:    Sodium 135 (*)    Glucose, Bld 309 (*)    Creatinine, Ser 1.11 (*)    Albumin 3.3 (*)    Total Bilirubin <0.2 (*)    GFR calc non  Af Amer 59 (*)    GFR calc Af Amer 69 (*)    All other components within normal limits  URINALYSIS, ROUTINE W REFLEX MICROSCOPIC - Abnormal; Notable for the following:    Glucose, UA >1000 (*)    All other components within normal limits  SEDIMENTATION RATE - Abnormal; Notable for the following:    Sed Rate 27 (*)  All other components within normal limits  URINE MICROSCOPIC-ADD ON - Abnormal; Notable for the following:    Squamous Epithelial / LPF FEW (*)    All other components within normal limits  URINE CULTURE  PRO B NATRIURETIC PEPTIDE  C-REACTIVE PROTEIN  POCT I-STAT TROPONIN I   Imaging Review Dg Chest 2 View  10/17/2013   CLINICAL DATA:  Chest pain.  EXAM: CHEST  2 VIEW  COMPARISON:  10/28/2010.  FINDINGS: The cardiac silhouette, mediastinal and hilar contours are within normal limits and stable. There are mild chronic bronchitic type lung changes but no infiltrates, edema or effusions. The bony thorax is intact.  IMPRESSION: Mild chronic bronchitic changes but no acute pulmonary infiltrates.   Electronically Signed   By: Loralie ChampagneMark  Gallerani M.D.   On: 10/17/2013 16:26   Dg Hand Complete Left  10/17/2013   CLINICAL DATA:  Hand pain and swelling without injury  EXAM: LEFT HAND - COMPLETE 3+ VIEW  COMPARISON:  None.  FINDINGS: No acute fracture or dislocation is noted. Changes of prior trauma to the distal ulnar styloid are seen. No soft tissue abnormality is noted. A deformity of the fifth digit is seen likely of a chronic nature.  IMPRESSION: No acute abnormality noted.   Electronically Signed   By: Alcide CleverMark  Lukens M.D.   On: 10/17/2013 16:23   Dg Hand Complete Right  10/17/2013   CLINICAL DATA:  Right hand pain and swelling.  No known injury.  EXAM: RIGHT HAND - COMPLETE 3+ VIEW  COMPARISON:  None.  FINDINGS: The joint spaces are maintained. No significant degenerative changes or erosive findings. Mild periarticular osteopenia is noted. No acute fracture or destructive bony changes.   IMPRESSION: Mild periarticular osteopenia.  No acute bony findings or significant degenerative changes.   Electronically Signed   By: Loralie ChampagneMark  Gallerani M.D.   On: 10/17/2013 16:30    EKG Interpretation    Date/Time:  Tuesday October 17 2013 15:33:23 EST Ventricular Rate:  90 PR Interval:  159 QRS Duration: 78 QT Interval:  318 QTC Calculation: 389 R Axis:   80 Text Interpretation:  Sinus rhythm Confirmed by COOK  MD, BRIAN ([id number]) on 10/17/2013 3:38:14 PM            MDM   Final diagnoses:  None   Filed Vitals:   10/17/13 1454 10/17/13 1500 10/17/13 1529 10/17/13 1646  BP: 111/68 114/63 104/65   Pulse: 95 89  98  Temp: 98.3 F (36.8 C)     TempSrc: Oral     Resp: 14     SpO2: 99% 99%  98%     Patient with negative ekg for ischemia.  I personally reviewed and interpreted EKGs. Cxr shows bronchitic changes.  Patient had an elevated glucose with out anion gap. Urine negative for infection,. Patient does have a lymphocytosis.  Just has a slight elevation in her serum creatinine, 0.7 total 1.11.  For this is likely secondary to dehydration.  Recent sedimentation rate is elevated however it is not markedly elevated.  Probably likely due to her recent infection.  He said hand x-rays show chronic arthritic changes.  Without signs of osteomyelitis or infections.  Skin is without tachycardia, fever, tachypnea.  No positional dyspnea.  Patient ambulated around the emergency department with pulse ox and did not drop below 99%.  Patient is Wells and perked negative.  Gout heart failure, PE, acute coronary syndrome as cause of her dyspnea. Patient will be discharged home with nonsteroidal anti-inflammatories for  arthritic changes.  She is to followup with her primary care physician regarding pulmonary function testing and referral to pulmonology.  Do not feel the patient didn't meet admission criteria. The patient appears reasonably screened and/or stabilized for discharge and I doubt any  other medical condition or other [facility] requiring further screening, evaluation, or treatment in the ED at this time prior to discharge.   Arthor Captain, PA-C 10/17/13 1725

## 2013-10-17 NOTE — ED Notes (Addendum)
Pneumonia 2wks ago, inhaler, abx. Pain in back straight through from chest. Pain on left arm from hands to elbow. Pain in right arm from hands to shoulder and radiates to right side of neck. Pt SOB with exertion. Denies swelling in her legs. Denies CP. Pt reports hx of tendonitis in right elbow. Also reports pain is relieved in both arms when BP cuff inflates.

## 2013-10-18 LAB — URINE CULTURE
Colony Count: NO GROWTH
Culture: NO GROWTH

## 2013-10-22 NOTE — ED Provider Notes (Signed)
Medical screening examination/treatment/procedure(s) were conducted as a shared visit with non-physician practitioner(s) and myself.  I personally evaluated the patient during the encounter.  EKG Interpretation    Date/Time:  Tuesday October 17 2013 15:33:23 EST Ventricular Rate:  90 PR Interval:  159 QRS Duration: 78 QT Interval:  318 QTC Calculation: 389 R Axis:   80 Text Interpretation:  Sinus rhythm Confirmed by Japneet Staggs  MD, Noel Henandez ([id number]) on 10/17/2013 3:38:14 PM           Patient is low risk for acute coronary syndrome or pulmonary embolism. She is ambulatory with normal pulse ox. No dyspnea.  Donnetta Hutching, MD 10/22/13 2052

## 2013-10-30 ENCOUNTER — Emergency Department (HOSPITAL_COMMUNITY)
Admission: EM | Admit: 2013-10-30 | Discharge: 2013-10-30 | Disposition: A | Discharge status: Home / Self Care | Payer: Medicaid Other | Attending: Emergency Medicine | Admitting: Emergency Medicine

## 2013-10-30 ENCOUNTER — Encounter (HOSPITAL_COMMUNITY): Payer: Self-pay | Admitting: Emergency Medicine

## 2013-10-30 ENCOUNTER — Emergency Department (HOSPITAL_COMMUNITY): Payer: Medicaid Other

## 2013-10-30 DIAGNOSIS — E119 Type 2 diabetes mellitus without complications: Secondary | ICD-10-CM | POA: Insufficient documentation

## 2013-10-30 DIAGNOSIS — Z9889 Other specified postprocedural states: Secondary | ICD-10-CM | POA: Insufficient documentation

## 2013-10-30 DIAGNOSIS — Z79899 Other long term (current) drug therapy: Secondary | ICD-10-CM | POA: Insufficient documentation

## 2013-10-30 DIAGNOSIS — M129 Arthropathy, unspecified: Secondary | ICD-10-CM | POA: Insufficient documentation

## 2013-10-30 DIAGNOSIS — J029 Acute pharyngitis, unspecified: Secondary | ICD-10-CM | POA: Insufficient documentation

## 2013-10-30 DIAGNOSIS — F17200 Nicotine dependence, unspecified, uncomplicated: Secondary | ICD-10-CM | POA: Insufficient documentation

## 2013-10-30 DIAGNOSIS — R110 Nausea: Secondary | ICD-10-CM

## 2013-10-30 DIAGNOSIS — R1310 Dysphagia, unspecified: Secondary | ICD-10-CM | POA: Insufficient documentation

## 2013-10-30 DIAGNOSIS — F329 Major depressive disorder, single episode, unspecified: Secondary | ICD-10-CM | POA: Insufficient documentation

## 2013-10-30 DIAGNOSIS — R197 Diarrhea, unspecified: Secondary | ICD-10-CM | POA: Insufficient documentation

## 2013-10-30 DIAGNOSIS — I10 Essential (primary) hypertension: Secondary | ICD-10-CM | POA: Insufficient documentation

## 2013-10-30 DIAGNOSIS — F3289 Other specified depressive episodes: Secondary | ICD-10-CM | POA: Insufficient documentation

## 2013-10-30 DIAGNOSIS — R1110 Vomiting, unspecified: Secondary | ICD-10-CM

## 2013-10-30 DIAGNOSIS — Z791 Long term (current) use of non-steroidal anti-inflammatories (NSAID): Secondary | ICD-10-CM | POA: Insufficient documentation

## 2013-10-30 DIAGNOSIS — R112 Nausea with vomiting, unspecified: Secondary | ICD-10-CM | POA: Insufficient documentation

## 2013-10-30 MED ORDER — ONDANSETRON 4 MG PO TBDP: 4.0000 mg | ORAL_TABLET | Freq: Once | ORAL | Status: DC

## 2013-10-30 NOTE — ED Notes (Signed)
Onset today woke up 0130 with acid reflux emesis, diarrhea, felt like choking and currently feels like something stuck in throat. Protecting own airway. States has not taken any own medication, drank, or ate anything.

## 2013-10-30 NOTE — ED Provider Notes (Signed)
CSN: [id number]     Arrival date & time 10/30/13  8099 History   First MD Initiated Contact with Patient 10/30/13 0845     Chief Complaint  Patient presents with  . Sore Throat  . Emesis  . Diarrhea    HPI  43 yo F with hx of HTN, DM, sleep apnea who presents with episode of nausea , emesis, and diarrhea last night with subsequent development of difficulty swallowing. She is tolerating po. No stridor or difficulty speaking. Contrary to nursing notes the patient states she was able to tolerate water this morning. Patient denies any fevers, HA, CP, SOB, ABD pain and states she no longer has nausea. No alleviating or aggravating factors.   Past Medical History  Diagnosis Date  . Hypertension   . Diabetes mellitus   . PONV (postoperative nausea and vomiting)   . Sleep apnea     lost 70lbs no cpap x67yrs now  . Arthritis     fingers  . Depression    Past Surgical History  Procedure Laterality Date  . Cholecystectomy    . Cardiac catheterization      4-67yrs ago  . Appendectomy    . Laparoscopic appendectomy  10/04/2011    Procedure: APPENDECTOMY LAPAROSCOPIC;  Surgeon: Rulon Abide, DO;  Location: [facility];  Service: General;  Laterality: N/A;   Family History  Problem Relation Age of Onset  . Aneurysm Mother   . Heart attack Father   . Stroke Father    History  Substance Use Topics  . Smoking status: Current Every Day Smoker -- 1.00 packs/day for 24 years    Types: Cigarettes  . Smokeless tobacco: Current User  . Alcohol Use: Yes     Comment: occassional/social   OB History   Grav Para Term Preterm Abortions TAB SAB Ect Mult Living                 Review of Systems  Constitutional: Negative for fever, chills, activity change and appetite change.  HENT: Positive for sore throat and trouble swallowing. Negative for congestion, ear pain and rhinorrhea.   Eyes: Negative for pain.  Respiratory: Negative for cough and shortness of breath.   Cardiovascular: Negative for  chest pain and palpitations.  Gastrointestinal: Positive for nausea, vomiting and diarrhea. Negative for abdominal pain.  Genitourinary: Negative for dysuria, difficulty urinating and pelvic pain.  Musculoskeletal: Negative for back pain and neck pain.  Skin: Negative for rash and wound.  Neurological: Negative for weakness and headaches.  Psychiatric/Behavioral: Negative for behavioral problems, confusion and agitation.      Allergies  Codeine and Sulfa antibiotics  Home Medications   Current Outpatient Rx  Name  Route  Sig  Dispense  Refill  . acetaminophen (TYLENOL) 325 MG tablet   Oral   Take 2 tablets (650 mg total) by mouth every 6 (six) hours as needed for pain or fever.   30 tablet   1   . albuterol (PROVENTIL HFA;VENTOLIN HFA) 108 (90 BASE) MCG/ACT inhaler   Inhalation   Inhale 2 puffs into the lungs every 6 (six) hours.         . calcium carbonate (TUMS EX) 750 MG chewable tablet   Oral   Chew 2 tablets by mouth every 4 (four) hours as needed for heartburn.         . diclofenac (VOLTAREN) 75 MG EC tablet   Oral   Take 75 mg by mouth 2 (two) times daily.         Marland Kitchen  FLUoxetine (PROZAC) 40 MG capsule   Oral   Take 40 mg by mouth daily.         Marland Kitchen glipiZIDE (GLUCOTROL) 10 MG tablet   Oral   Take 1 tablet (10 mg total) by mouth 2 (two) times daily before a meal.   60 tablet   6   . lisinopril (PRINIVIL,ZESTRIL) 20 MG tablet   Oral   Take 1 tablet (20 mg total) by mouth daily.   30 tablet   6   . metFORMIN (GLUMETZA) 1000 MG (MOD) 24 hr tablet   Oral   Take 1 tablet (1,000 mg total) by mouth 2 (two) times daily with a meal.   60 tablet   6   . ranitidine (ZANTAC) 75 MG tablet   Oral   Take 150 mg by mouth 2 (two) times daily as needed for heartburn.          BP 113/57  Pulse 85  Temp(Src) 98 F (36.7 C) (Oral)  Resp 16  Ht 5\' 9"  (1.753 m)  Wt 170 lb (77.111 kg)  BMI 25.09 kg/m2  SpO2 99% Physical Exam  Constitutional: She is  oriented to person, place, and time. She appears well-developed and well-nourished. No distress.  HENT:  Head: Normocephalic and atraumatic.  Nose: Nose normal.  Mouth/Throat: Oropharynx is clear and moist.  Eyes: EOM are normal. Pupils are equal, round, and reactive to light.  Neck: Normal range of motion. Neck supple. No tracheal deviation present.  Cardiovascular: Normal rate, regular rhythm, normal heart sounds and intact distal pulses.   Pulmonary/Chest: Effort normal and breath sounds normal. She has no rales.  Abdominal: Soft. Bowel sounds are normal. She exhibits no distension. There is no tenderness. There is no rebound and no guarding.  Musculoskeletal: Normal range of motion. She exhibits no tenderness.  Neurological: She is alert and oriented to person, place, and time.  Skin: Skin is warm and dry. No rash noted.  Psychiatric: She has a normal mood and affect. Her behavior is normal.    ED Course  Procedures (including critical care time)  Imaging Review Dg Neck Soft Tissue  10/30/2013   CLINICAL DATA:  Sore throat, nausea, vomiting  EXAM: NECK SOFT TISSUES - 1+ VIEW  COMPARISON:  08/23/2009  FINDINGS: Normal retropharyngeal prevertebral soft tissues. Normal epiglottic shadow. Degenerative changes of the midcervical spine, most pronounced at C5-6 with disc space loss, and endplate bony spurring. Dental hardware noted. No definite radiopaque foreign body.  IMPRESSION: No acute finding by plain radiography.  Mid cervical degenerative change.   Electronically Signed   By: 08/25/2009 M.D.   On: 10/30/2013 09:29   Dg Chest 2 View  10/30/2013   CLINICAL DATA:  Cough, nausea and vomiting  EXAM: CHEST  2 VIEW  COMPARISON:  10/17/2013  FINDINGS: The heart size and mediastinal contours are within normal limits. Both lungs are clear. The visualized skeletal structures are unremarkable.  IMPRESSION: No active cardiopulmonary disease.   Electronically Signed   By: 10/19/2013 M.D.   On:  10/30/2013 09:26   Dg Esophagus  10/30/2013   CLINICAL DATA:  Sore throat.  Vomiting.  EXAM: ESOPHOGRAM / BARIUM SWALLOW / BARIUM TABLET STUDY  TECHNIQUE: Combined double contrast and single contrast examination performed using effervescent crystals, thick barium liquid, and thin barium liquid. The patient was observed with fluoroscopy swallowing a 76mm barium sulphate tablet.  COMPARISON:  None.  FLUOROSCOPY TIME:  1 min, 35 seconds.  FINDINGS: The pharynx  appears normal. No aspiration or penetration was visualized. The esophagus is normal in appearance without stricture, mass or evidence of inflammatory change. Esophageal motility is unremarkable. No gastroesophageal reflux was elicited on the examination. A 13 mm barium tablet passed easily into the stomach.  IMPRESSION: Negative esophagram.   Electronically Signed   By: Drusilla Kanner M.D.   On: 10/30/2013 10:58     MDM   Final diagnoses:  Emesis  Nausea  Diarrhea   43 yo F in NAD AFVSS non toxic appearing who had a bout of emesis and diarrhea last night with subsequent development of discomfort with swallowing. No stridor, wheeze or difficulty breathing. Patient able to speak in full sentences in ED. CXR, soft tissue neck and barium swallow all wnl. Patient tolerating po in ED. Suspect irritation from the emesis early this morning. Case co managed with my attending Dr. Radford Pax. Thorough discussion with patient on plan , findings, return precautions. She plans to follow up with her PCP in 1-2 days or sooner in ED if any concerns.      Nadara Mustard, MD 10/30/13 1130

## 2013-10-30 NOTE — Discharge Instructions (Signed)

## 2013-11-03 NOTE — ED Provider Notes (Signed)
I saw and evaluated the patient, reviewed the resident's note and I agree with the findings and plan.   .Face to face Exam:  General:  Awake HEENT:  Atraumatic Resp:  Normal effort Abd:  Nondistended Neuro:No focal weakness   Nelia Shi, MD 11/03/13 1500

## 2013-11-20 ENCOUNTER — Other Ambulatory Visit: Payer: Self-pay | Admitting: Nurse Practitioner

## 2013-11-20 DIAGNOSIS — Z1231 Encounter for screening mammogram for malignant neoplasm of breast: Secondary | ICD-10-CM

## 2013-11-22 ENCOUNTER — Encounter: Payer: Self-pay | Admitting: Pulmonary Disease

## 2013-11-22 ENCOUNTER — Ambulatory Visit (INDEPENDENT_AMBULATORY_CARE_PROVIDER_SITE_OTHER): Payer: Medicaid Other | Admitting: Pulmonary Disease

## 2013-11-22 VITALS — BP 124/68 | HR 82 | Temp 97.8°F | Ht 69.5 in | Wt 262.6 lb

## 2013-11-22 DIAGNOSIS — R0602 Shortness of breath: Secondary | ICD-10-CM

## 2013-11-22 DIAGNOSIS — R0989 Other specified symptoms and signs involving the circulatory and respiratory systems: Secondary | ICD-10-CM

## 2013-11-22 DIAGNOSIS — R0609 Other forms of dyspnea: Secondary | ICD-10-CM

## 2013-11-22 DIAGNOSIS — R0600 Dyspnea, unspecified: Secondary | ICD-10-CM

## 2013-11-22 DIAGNOSIS — G4733 Obstructive sleep apnea (adult) (pediatric): Secondary | ICD-10-CM

## 2013-11-22 HISTORY — DX: Dyspnea, unspecified: R06.00

## 2013-11-22 NOTE — Assessment & Plan Note (Signed)
Lung function is OK You do NOT have COPD Albuterol as needed only Weight loss encouraged - continue exercise program Congratulations on QUITTING smoking

## 2013-11-22 NOTE — Patient Instructions (Signed)
Lung function is OK You do NOT have COPD Albuterol as needed only Home sleep test  Weight loss encouraged Congratulations on QUITTING smoking

## 2013-11-22 NOTE — Assessment & Plan Note (Addendum)
Home sleep study Is significant events she will have to get back on CPAP Hopefully this will help to improve her energy levels, and keep her motivated for her exercise program.

## 2013-11-22 NOTE — Progress Notes (Signed)
Subjective:    Patient ID: [name], female    DOB: Sep 09, 1970, 43 y.o.   MRN: [id number]  HPI  Chief Complaint  Patient presents with  . Advice Only    refer chronic bronchitis x couple months. SOB w/ exertion, very slight prod cough-yellow phlem, slight wheezing.    43 year old ex-smoker presents for evaluation of dyspnea and sleep apnea. She smoked up to 2 packs per day for 24 years-about 40 pack years-until she quit in February 2015.  She had a bad episode of bronchitis in January, she has recovered from this but reports persistent shortness of breath for a year on exertion such as walking or climbing stairs, and relieved by resting . She denies chest pain or palpitations. She has started an exercise program and is able to walk on the treadmill for a mile in 20 minutes. She reports seasonal allergies requiring Zyrtec. Albuterol inhaler has helped and she uses this about once daily.  Chest x-ray did not show any infiltrates or effusions. Barium swallow showed normal esophagus without reflux. Spirometry showed normal lung function without any evidence of airway obstruction.  Polysomnogram in January 2006 - weight 280 pounds-showed severe obstructive sleep apnea with RDI 64 events per hour and lowest desaturation of 78%.she's lost significant weight down to 200 pounds and stopped using a CPAP, but now has regained up to 262 pounds. Epworth sleepiness score is 20 She report sleepiness her driving more than 15 minutes and multiple nocturnal awakenings. Bedtime is around 10 PM, sleep latency is minimal, she is out of bed by 6 AM feeling tired with occasional dryness of mouth. There is no history suggestive of cataplexy, sleep paralysis or parasomnias   Past Medical History  Diagnosis Date  . Hypertension   . Diabetes mellitus   . PONV (postoperative nausea and vomiting)   . Sleep apnea     lost 70lbs no cpap x72yrs now  . Arthritis     fingers  . Depression    Past Surgical  History  Procedure Laterality Date  . Cholecystectomy    . Cardiac catheterization      4-42yrs ago  . Endometrial ablation      6 years ago  . Laparoscopic appendectomy  10/04/2011    Procedure: APPENDECTOMY LAPAROSCOPIC;  Surgeon: Rulon Abide, DO;  Location: [facility];  Service: General;  Laterality: N/A;    Allergies  Allergen Reactions  . Codeine Itching  . Sulfa Antibiotics Hives    History   Social History  . Marital Status: Widowed    Spouse Name: N/A    Number of Children: 1  . Years of Education: N/A   Occupational History  . none    Social History Main Topics  . Smoking status: Former Smoker -- 2.00 packs/day for 24 years    Types: Cigarettes    Quit date: 10/08/2013  . Smokeless tobacco: Current User  . Alcohol Use: Yes     Comment: occassional/social  . Drug Use: No     Comment: 25 years ago 11/22/13  . Sexual Activity: No   Other Topics Concern  . Not on file   Social History Narrative  . No narrative on file    Family History  Problem Relation Age of Onset  . Aneurysm Mother   . Heart attack Father   . Stroke Father   . Breast cancer Paternal Grandmother       Review of Systems  Constitutional: Negative for fever and unexpected weight  change.  HENT: Positive for congestion, postnasal drip, rhinorrhea, sinus pressure, sneezing and trouble swallowing. Negative for dental problem, ear pain, nosebleeds and sore throat.   Eyes: Negative for redness and itching.  Respiratory: Positive for cough, shortness of breath and wheezing. Negative for chest tightness.   Cardiovascular: Negative for palpitations and leg swelling.  Gastrointestinal: Negative for nausea and vomiting.  Genitourinary: Negative for dysuria.  Musculoskeletal: Negative for joint swelling.  Skin: Negative for rash.  Neurological: Positive for headaches.  Hematological: Does not bruise/bleed easily.  Psychiatric/Behavioral: Negative for dysphoric mood. The patient is not  nervous/anxious.        Objective:   Physical Exam  Gen. Pleasant, obese, in no distress, normal affect ENT - no lesions, no post nasal drip, class 2-3 airway Neck: No JVD, no thyromegaly, no carotid bruits Lungs: no use of accessory muscles, no dullness to percussion, decreased without rales or rhonchi  Cardiovascular: Rhythm regular, heart sounds  normal, no murmurs or gallops, no peripheral edema Abdomen: soft and non-tender, no hepatosplenomegaly, BS normal. Musculoskeletal: No deformities, no cyanosis or clubbing Neuro:  alert, non focal, no tremors        Assessment & Plan:

## 2013-11-29 ENCOUNTER — Ambulatory Visit
Admission: RE | Admit: 2013-11-29 | Discharge: 2013-11-29 | Disposition: A | Discharge status: Home / Self Care | Payer: Medicaid Other | Source: Ambulatory Visit | Attending: Nurse Practitioner | Admitting: Nurse Practitioner

## 2013-11-29 DIAGNOSIS — Z1231 Encounter for screening mammogram for malignant neoplasm of breast: Secondary | ICD-10-CM

## 2013-12-11 ENCOUNTER — Encounter (HOSPITAL_COMMUNITY): Payer: Self-pay | Admitting: Emergency Medicine

## 2013-12-11 ENCOUNTER — Emergency Department (HOSPITAL_COMMUNITY)
Admission: EM | Admit: 2013-12-11 | Discharge: 2013-12-11 | Disposition: A | Discharge status: Home / Self Care | Payer: Medicaid Other | Attending: Emergency Medicine | Admitting: Emergency Medicine

## 2013-12-11 DIAGNOSIS — Z79899 Other long term (current) drug therapy: Secondary | ICD-10-CM | POA: Insufficient documentation

## 2013-12-11 DIAGNOSIS — R739 Hyperglycemia, unspecified: Secondary | ICD-10-CM

## 2013-12-11 DIAGNOSIS — Z3202 Encounter for pregnancy test, result negative: Secondary | ICD-10-CM | POA: Insufficient documentation

## 2013-12-11 DIAGNOSIS — R5381 Other malaise: Secondary | ICD-10-CM | POA: Insufficient documentation

## 2013-12-11 DIAGNOSIS — Z87891 Personal history of nicotine dependence: Secondary | ICD-10-CM | POA: Insufficient documentation

## 2013-12-11 DIAGNOSIS — I10 Essential (primary) hypertension: Secondary | ICD-10-CM | POA: Insufficient documentation

## 2013-12-11 DIAGNOSIS — H539 Unspecified visual disturbance: Secondary | ICD-10-CM

## 2013-12-11 DIAGNOSIS — F3289 Other specified depressive episodes: Secondary | ICD-10-CM | POA: Insufficient documentation

## 2013-12-11 DIAGNOSIS — M129 Arthropathy, unspecified: Secondary | ICD-10-CM | POA: Insufficient documentation

## 2013-12-11 DIAGNOSIS — E119 Type 2 diabetes mellitus without complications: Secondary | ICD-10-CM | POA: Insufficient documentation

## 2013-12-11 DIAGNOSIS — E860 Dehydration: Secondary | ICD-10-CM | POA: Insufficient documentation

## 2013-12-11 DIAGNOSIS — H538 Other visual disturbances: Secondary | ICD-10-CM | POA: Insufficient documentation

## 2013-12-11 DIAGNOSIS — R51 Headache: Secondary | ICD-10-CM | POA: Insufficient documentation

## 2013-12-11 DIAGNOSIS — R5383 Other fatigue: Secondary | ICD-10-CM | POA: Insufficient documentation

## 2013-12-11 DIAGNOSIS — F329 Major depressive disorder, single episode, unspecified: Secondary | ICD-10-CM | POA: Insufficient documentation

## 2013-12-11 DIAGNOSIS — Z791 Long term (current) use of non-steroidal anti-inflammatories (NSAID): Secondary | ICD-10-CM | POA: Insufficient documentation

## 2013-12-11 LAB — COMPREHENSIVE METABOLIC PANEL
ALT: 36 U/L — ABNORMAL HIGH (ref 0–35)
AST: 22 U/L (ref 0–37)
Albumin: 3.3 g/dL — ABNORMAL LOW (ref 3.5–5.2)
Alkaline Phosphatase: 96 U/L (ref 39–117)
BUN: 27 mg/dL — ABNORMAL HIGH (ref 6–23)
CO2: 23 mEq/L (ref 19–32)
Calcium: 9.1 mg/dL (ref 8.4–10.5)
Chloride: 96 mEq/L (ref 96–112)
Creatinine, Ser: 1.03 mg/dL (ref 0.50–1.10)
GFR calc Af Amer: 75 mL/min — ABNORMAL LOW (ref >90)
GFR calc non Af Amer: 65 mL/min — ABNORMAL LOW (ref >90)
Glucose, Bld: 311 mg/dL — ABNORMAL HIGH (ref 70–99)
Potassium: 4.9 mEq/L (ref 3.7–5.3)
Sodium: 132 mEq/L — ABNORMAL LOW (ref 137–147)
Total Bilirubin: <0.2 mg/dL — ABNORMAL LOW (ref 0.3–1.2)
Total Protein: 6.5 g/dL (ref 6.0–8.3)

## 2013-12-11 LAB — CBC
HCT: 37 % (ref 36.0–46.0)
Hemoglobin: 12.4 g/dL (ref 12.0–15.0)
MCH: 29.2 pg (ref 26.0–34.0)
MCHC: 33.5 g/dL (ref 30.0–36.0)
MCV: 87.3 fL (ref 78.0–100.0)
Platelets: 192 10*3/uL (ref 150–400)
RBC: 4.24 MIL/uL (ref 3.87–5.11)
RDW: 12.4 % (ref 11.5–15.5)
WBC: 8.2 10*3/uL (ref 4.0–10.5)

## 2013-12-11 LAB — URINALYSIS, ROUTINE W REFLEX MICROSCOPIC
Bilirubin Urine: NEGATIVE
Glucose, UA: >1000 mg/dL — AB
Hgb urine dipstick: NEGATIVE
Ketones, ur: NEGATIVE mg/dL
Leukocytes, UA: NEGATIVE
Nitrite: NEGATIVE
Protein, ur: NEGATIVE mg/dL
Specific Gravity, Urine: 1.021 (ref 1.005–1.030)
Urobilinogen, UA: 0.2 mg/dL (ref 0.0–1.0)
pH: 5 (ref 5.0–8.0)

## 2013-12-11 LAB — POC URINE PREG, ED: Preg Test, Ur: NEGATIVE

## 2013-12-11 LAB — CBG MONITORING, ED: Glucose-Capillary: 348 mg/dL — ABNORMAL HIGH (ref 70–99)

## 2013-12-11 LAB — URINE MICROSCOPIC-ADD ON

## 2013-12-11 MED ORDER — GLIPIZIDE 10 MG PO TABS: 20.0000 mg | ORAL_TABLET | Freq: Two times a day (BID) | ORAL | Status: DC

## 2013-12-11 MED ORDER — SODIUM CHLORIDE 0.9 % IV BOLUS (SEPSIS)
1000.0000 mL | Freq: Once | INTRAVENOUS | Status: AC
  Administered 2013-12-11: 1000 mL via INTRAVENOUS

## 2013-12-11 NOTE — ED Notes (Signed)
Patient c/o blurred vision, headache, and hyperglycemia. Patient states she is compliant with oral diabetic medications that were prescribed, but appointment is not for another month.

## 2013-12-11 NOTE — Discharge Instructions (Signed)
Continue your medications. Double the dose of glipizide. Watch your diet, exercise. Drink plenty of fluids. Follow up with your doctor.   Hyperglycemia Hyperglycemia occurs when the glucose (sugar) in your blood is too high. Hyperglycemia can happen for many reasons, but it most often happens to people who do not know they have diabetes or are not managing their diabetes properly.  CAUSES  Whether you have diabetes or not, there are other causes of hyperglycemia. Hyperglycemia can occur when you have diabetes, but it can also occur in other situations that you might not be as aware of, such as: Diabetes  If you have diabetes and are having problems controlling your blood glucose, hyperglycemia could occur because of some of the following reasons:  Not following your meal plan.  Not taking your diabetes medications or not taking it properly.  Exercising less or doing less activity than you normally do.  Being sick. Pre-diabetes  This cannot be ignored. Before people develop Type 2 diabetes, they almost always have "pre-diabetes." This is when your blood glucose levels are higher than normal, but not yet high enough to be diagnosed as diabetes. Research has shown that some long-term damage to the body, especially the heart and circulatory system, may already be occurring during pre-diabetes. If you take action to manage your blood glucose when you have pre-diabetes, you may delay or prevent Type 2 diabetes from developing. Stress  If you have diabetes, you may be "diet" controlled or on oral medications or insulin to control your diabetes. However, you may find that your blood glucose is higher than usual in the hospital whether you have diabetes or not. This is often referred to as "stress hyperglycemia." Stress can elevate your blood glucose. This happens because of hormones put out by the body during times of stress. If stress has been the cause of your high blood glucose, it can be followed  regularly by your caregiver. That way he/she can make sure your hyperglycemia does not continue to get worse or progress to diabetes. Steroids  Steroids are medications that act on the infection fighting system (immune system) to block inflammation or infection. One side effect can be a rise in blood glucose. Most people can produce enough extra insulin to allow for this rise, but for those who cannot, steroids make blood glucose levels go even higher. It is not unusual for steroid treatments to "uncover" diabetes that is developing. It is not always possible to determine if the hyperglycemia will go away after the steroids are stopped. A special blood test called an A1c is sometimes done to determine if your blood glucose was elevated before the steroids were started. SYMPTOMS  Thirsty.  Frequent urination.  Dry mouth.  Blurred vision.  Tired or fatigue.  Weakness.  Sleepy.  Tingling in feet or leg. DIAGNOSIS  Diagnosis is made by monitoring blood glucose in one or all of the following ways:  A1c test. This is a chemical found in your blood.  Fingerstick blood glucose monitoring.  Laboratory results. TREATMENT  First, knowing the cause of the hyperglycemia is important before the hyperglycemia can be treated. Treatment may include, but is not be limited to:  Education.  Change or adjustment in medications.  Change or adjustment in meal plan.  Treatment for an illness, infection, etc.  More frequent blood glucose monitoring.  Change in exercise plan.  Decreasing or stopping steroids.  Lifestyle changes. HOME CARE INSTRUCTIONS   Test your blood glucose as directed.  Exercise regularly.  Your caregiver will give you instructions about exercise. Pre-diabetes or diabetes which comes on with stress is helped by exercising.  Eat wholesome, balanced meals. Eat often and at regular, fixed times. Your caregiver or nutritionist will give you a meal plan to guide your sugar  intake.  Being at an ideal weight is important. If needed, losing as little as 10 to 15 pounds may help improve blood glucose levels. SEEK MEDICAL CARE IF:   You have questions about medicine, activity, or diet.  You continue to have symptoms (problems such as increased thirst, urination, or weight gain). SEEK IMMEDIATE MEDICAL CARE IF:   You are vomiting or have diarrhea.  Your breath smells fruity.  You are breathing faster or slower.  You are very sleepy or incoherent.  You have numbness, tingling, or pain in your feet or hands.  You have chest pain.  Your symptoms get worse even though you have been following your caregiver's orders.  If you have any other questions or concerns. Document Released: 02/10/2001 Document Revised: 11/09/2011 Document Reviewed: 12/14/2011 [facility] Patient Information 2014 [place], [place].

## 2013-12-11 NOTE — ED Provider Notes (Signed)
CSN: [id number]     Arrival date & time 12/11/13  1740 History   First MD Initiated Contact with Patient 12/11/13 [phone]     Chief Complaint  Patient presents with  . Hyperglycemia  . Headache     (Consider location/radiation/quality/duration/timing/severity/associated sxs/prior Treatment) HPI [name] is a 43 y.o. female who presents to emergency department complaining of elevated blood sugars, mild headache, progressively worsening blurred vision. Patient states that she has had problems with her glucometer, and realized when she got a new one that her blood sugars have been consistently high. She states she went and saw her doctor for routine physical, and mentioned her blood sugars are high, however she was told to schedule an appointment to address that issue separately, and she has an appointment scheduled in a week. Patient states that the last week she has noticed that her vision has been gradually getting more blurry bilaterally. She denies any pain in her eyes, she denies any double vision, she denies any photophobia. No eye injury. She also reports a gradual onset mild headache currently rated 4-10. Patient states that she has been taking her regular diabetes medications, only missed one dose in the last week. She states she has been exercising, however not as much of the last week because she hasn't been feeling well. She states she has been trying to eat well as well. She denies any sudden onset of severe headaches, no fever, no nuchal rigidity, no nausea, vomiting, abdominal pain, diarrhea. She admits to thirst, urinary frequency.   Past Medical History  Diagnosis Date  . Hypertension   . Diabetes mellitus   . PONV (postoperative nausea and vomiting)   . Sleep apnea     lost 70lbs no cpap x57yrs now  . Arthritis     fingers  . Depression   . Dyspnea 11/22/2013   Past Surgical History  Procedure Laterality Date  . Cholecystectomy    . Cardiac catheterization      4-24yrs  ago  . Endometrial ablation      6 years ago  . Laparoscopic appendectomy  10/04/2011    Procedure: APPENDECTOMY LAPAROSCOPIC;  Surgeon: Rulon Abide, DO;  Location: [facility];  Service: General;  Laterality: N/A;   Family History  Problem Relation Age of Onset  . Aneurysm Mother   . Heart attack Father   . Stroke Father   . Breast cancer Paternal Grandmother    History  Substance Use Topics  . Smoking status: Former Smoker -- 2.00 packs/day for 24 years    Types: Cigarettes    Quit date: 10/08/2013  . Smokeless tobacco: Never Used  . Alcohol Use: Yes     Comment: seldom   OB History   Grav Para Term Preterm Abortions TAB SAB Ect Mult Living                 Review of Systems  Constitutional: Positive for fatigue. Negative for fever and chills.  HENT: Negative.   Eyes: Positive for visual disturbance. Negative for photophobia, pain and itching.  Respiratory: Negative for cough, chest tightness and shortness of breath.   Cardiovascular: Negative for chest pain, palpitations and leg swelling.  Gastrointestinal: Negative for nausea, vomiting, abdominal pain and diarrhea.  Genitourinary: Negative for dysuria, flank pain, vaginal bleeding, vaginal discharge, vaginal pain and pelvic pain.  Musculoskeletal: Negative for arthralgias, myalgias, neck pain and neck stiffness.  Skin: Negative for rash.  Neurological: Negative for dizziness, weakness and headaches.  All other  systems reviewed and are negative.     Allergies  Codeine and Sulfa antibiotics  Home Medications   Current Outpatient Rx  Name  Route  Sig  Dispense  Refill  . albuterol (PROVENTIL HFA;VENTOLIN HFA) 108 (90 BASE) MCG/ACT inhaler   Inhalation   Inhale 2 puffs into the lungs every 6 (six) hours.         . diclofenac (VOLTAREN) 75 MG EC tablet   Oral   Take 75 mg by mouth 2 (two) times daily.         Marland Kitchen esomeprazole (NEXIUM) 20 MG capsule   Oral   Take 20 mg by mouth daily at 12 noon.          Marland Kitchen FLUoxetine (PROZAC) 40 MG capsule   Oral   Take 40 mg by mouth daily.         Marland Kitchen glipiZIDE (GLUCOTROL) 10 MG tablet   Oral   Take 1 tablet (10 mg total) by mouth 2 (two) times daily before a meal.   60 tablet   6   . lisinopril (PRINIVIL,ZESTRIL) 20 MG tablet   Oral   Take 1 tablet (20 mg total) by mouth daily.   30 tablet   6   . metFORMIN (GLUMETZA) 1000 MG (MOD) 24 hr tablet   Oral   Take 1 tablet (1,000 mg total) by mouth 2 (two) times daily with a meal.   60 tablet   6    BP 95/72  Pulse 84  Temp(Src) 98.1 F (36.7 C) (Oral)  Resp 16  SpO2 97% Physical Exam  Nursing note and vitals reviewed. Constitutional: She appears well-developed and well-nourished. No distress.  HENT:  Head: Normocephalic.  Oral mucosa dry  Eyes: Conjunctivae and EOM are normal. Pupils are equal, round, and reactive to light.  Neck: Neck supple.  Cardiovascular: Normal rate, regular rhythm and normal heart sounds.   Pulmonary/Chest: Effort normal and breath sounds normal. No respiratory distress. She has no wheezes. She has no rales.  Abdominal: Soft. Bowel sounds are normal. She exhibits no distension. There is no tenderness. There is no rebound.  Musculoskeletal: She exhibits no edema.  Neurological: She is alert.  Skin: Skin is warm and dry.  Psychiatric: She has a normal mood and affect. Her behavior is normal.    ED Course  Procedures (including critical care time) Labs Review Labs Reviewed  COMPREHENSIVE METABOLIC PANEL - Abnormal; Notable for the following:    Sodium 132 (*)    Glucose, Bld 311 (*)    BUN 27 (*)    Albumin 3.3 (*)    ALT 36 (*)    Total Bilirubin <0.2 (*)    GFR calc non Af Amer 65 (*)    GFR calc Af Amer 75 (*)    All other components within normal limits  URINALYSIS, ROUTINE W REFLEX MICROSCOPIC - Abnormal; Notable for the following:    APPearance CLOUDY (*)    Glucose, UA >1000 (*)    All other components within normal limits  URINE  MICROSCOPIC-ADD ON - Abnormal; Notable for the following:    Squamous Epithelial / LPF FEW (*)    Bacteria, UA MANY (*)    All other components within normal limits  CBG MONITORING, ED - Abnormal; Notable for the following:    Glucose-Capillary 348 (*)    All other components within normal limits  CBC  CBG MONITORING, ED  POC URINE PREG, ED   Imaging Review No results found.  EKG Interpretation None      MDM   Final diagnoses:  Hyperglycemia  Visual changes  Dehydration    Pt with elevated blood sugar at home, weakness, progressive blurred vision. Pt's exam is unremarkable. Will get labs, pt does appear dry, will give fluids.  Visual Acuity - Bilateral Distance: 20/40 ; R Distance: 20/40 ; L Distance: 20/30   Pt's labs unremarkable. She was rehydrated with 3 L of NS. She is feeling better. Discussed with Dr. Effie Shy. No signs of DKA. Anion gap 13. Will increase gilpizide to 20mg  BID. Pt has follow up with pcp in 1 week.   Filed Vitals:   12/11/13 0934 12/11/13 1115 12/11/13 1329  BP: 95/72 102/68 113/69  Pulse: 84 77 77  Temp: 98.1 F (36.7 C)  98.1 F (36.7 C)  TempSrc: Oral  Oral  Resp: 16 19 19   SpO2: 97% 96% 97%        Lottie Mussel, PA-C 12/11/13 1557

## 2013-12-12 ENCOUNTER — Encounter (HOSPITAL_COMMUNITY): Payer: Self-pay | Admitting: Emergency Medicine

## 2013-12-12 ENCOUNTER — Emergency Department (HOSPITAL_COMMUNITY)
Admission: EM | Admit: 2013-12-12 | Discharge: 2013-12-12 | Disposition: A | Discharge status: Home / Self Care | Payer: Medicaid Other | Attending: Emergency Medicine | Admitting: Emergency Medicine

## 2013-12-12 DIAGNOSIS — Z87442 Personal history of urinary calculi: Secondary | ICD-10-CM | POA: Insufficient documentation

## 2013-12-12 DIAGNOSIS — Z79899 Other long term (current) drug therapy: Secondary | ICD-10-CM | POA: Insufficient documentation

## 2013-12-12 DIAGNOSIS — M549 Dorsalgia, unspecified: Secondary | ICD-10-CM | POA: Insufficient documentation

## 2013-12-12 DIAGNOSIS — F329 Major depressive disorder, single episode, unspecified: Secondary | ICD-10-CM | POA: Insufficient documentation

## 2013-12-12 DIAGNOSIS — M791 Myalgia, unspecified site: Secondary | ICD-10-CM

## 2013-12-12 DIAGNOSIS — Z87891 Personal history of nicotine dependence: Secondary | ICD-10-CM | POA: Insufficient documentation

## 2013-12-12 DIAGNOSIS — R3589 Other polyuria: Secondary | ICD-10-CM | POA: Insufficient documentation

## 2013-12-12 DIAGNOSIS — R631 Polydipsia: Secondary | ICD-10-CM | POA: Insufficient documentation

## 2013-12-12 DIAGNOSIS — I10 Essential (primary) hypertension: Secondary | ICD-10-CM | POA: Insufficient documentation

## 2013-12-12 DIAGNOSIS — H538 Other visual disturbances: Secondary | ICD-10-CM | POA: Insufficient documentation

## 2013-12-12 DIAGNOSIS — F3289 Other specified depressive episodes: Secondary | ICD-10-CM | POA: Insufficient documentation

## 2013-12-12 DIAGNOSIS — R632 Polyphagia: Secondary | ICD-10-CM | POA: Insufficient documentation

## 2013-12-12 DIAGNOSIS — M129 Arthropathy, unspecified: Secondary | ICD-10-CM | POA: Insufficient documentation

## 2013-12-12 DIAGNOSIS — E119 Type 2 diabetes mellitus without complications: Secondary | ICD-10-CM | POA: Insufficient documentation

## 2013-12-12 DIAGNOSIS — R358 Other polyuria: Secondary | ICD-10-CM | POA: Insufficient documentation

## 2013-12-12 DIAGNOSIS — IMO0001 Reserved for inherently not codable concepts without codable children: Secondary | ICD-10-CM | POA: Insufficient documentation

## 2013-12-12 DIAGNOSIS — Z791 Long term (current) use of non-steroidal anti-inflammatories (NSAID): Secondary | ICD-10-CM | POA: Insufficient documentation

## 2013-12-12 HISTORY — DX: Calculus of kidney: N20.0

## 2013-12-12 LAB — URINALYSIS, ROUTINE W REFLEX MICROSCOPIC
Bilirubin Urine: NEGATIVE
Glucose, UA: >1000 mg/dL — AB
Hgb urine dipstick: NEGATIVE
Ketones, ur: NEGATIVE mg/dL
Leukocytes, UA: NEGATIVE
Nitrite: NEGATIVE
Protein, ur: NEGATIVE mg/dL
Specific Gravity, Urine: 1.018 (ref 1.005–1.030)
Urobilinogen, UA: 0.2 mg/dL (ref 0.0–1.0)
pH: 5 (ref 5.0–8.0)

## 2013-12-12 LAB — CBG MONITORING, ED: Glucose-Capillary: 273 mg/dL — ABNORMAL HIGH (ref 70–99)

## 2013-12-12 LAB — URINE MICROSCOPIC-ADD ON

## 2013-12-12 MED ORDER — ONDANSETRON 4 MG PO TBDP
4.0000 mg | ORAL_TABLET | Freq: Once | ORAL | Status: AC
  Administered 2013-12-12: 4 mg via ORAL
  Filled 2013-12-12: qty 1

## 2013-12-12 MED ORDER — TRAMADOL HCL 50 MG PO TABS: 50.0000 mg | ORAL_TABLET | Freq: Four times a day (QID) | ORAL | Status: DC | PRN

## 2013-12-12 MED ORDER — NAPROXEN 500 MG PO TABS: 500.0000 mg | ORAL_TABLET | Freq: Two times a day (BID) | ORAL | Status: DC

## 2013-12-12 MED ORDER — METHOCARBAMOL 500 MG PO TABS: 500.0000 mg | ORAL_TABLET | Freq: Three times a day (TID) | ORAL | Status: DC | PRN

## 2013-12-12 MED ORDER — HYDROCODONE-ACETAMINOPHEN 5-325 MG PO TABS
1.0000 | ORAL_TABLET | Freq: Once | ORAL | Status: AC
  Administered 2013-12-12: 1 via ORAL
  Filled 2013-12-12: qty 1

## 2013-12-12 NOTE — Discharge Instructions (Signed)
Musculoskeletal Pain °Musculoskeletal pain is muscle and boney aches and pains. These pains can occur in any part of the body. Your caregiver may treat you without knowing the cause of the pain. They may treat you if blood or urine tests, X-rays, and other tests were normal.  °CAUSES °There is often not a definite cause or reason for these pains. These pains may be caused by a type of germ (virus). The discomfort may also come from overuse. Overuse includes working out too hard when your body is not fit. Boney aches also come from weather changes. Bone is sensitive to atmospheric pressure changes. °HOME CARE INSTRUCTIONS  °· Ask when your test results will be ready. Make sure you get your test results. °· Only take over-the-counter or prescription medicines for pain, discomfort, or fever as directed by your caregiver. If you were given medications for your condition, do not drive, operate machinery or power tools, or sign legal documents for 24 hours. Do not drink alcohol. Do not take sleeping pills or other medications that may interfere with treatment. °· Continue all activities unless the activities cause more pain. When the pain lessens, slowly resume normal activities. Gradually increase the intensity and duration of the activities or exercise. °· During periods of severe pain, bed rest may be helpful. Lay or sit in any position that is comfortable. °· Putting ice on the injured area. °· Put ice in a bag. °· Place a towel between your skin and the bag. °· Leave the ice on for 15 to 20 minutes, 3 to 4 times a day. °· Follow up with your caregiver for continued problems and no reason can be found for the pain. If the pain becomes worse or does not go away, it may be necessary to repeat tests or do additional testing. Your caregiver may need to look further for a possible cause. °SEEK IMMEDIATE MEDICAL CARE IF: °· You have pain that is getting worse and is not relieved by medications. °· You develop chest pain  that is associated with shortness or breath, sweating, feeling sick to your stomach (nauseous), or throw up (vomit). °· Your pain becomes localized to the abdomen. °· You develop any new symptoms that seem different or that concern you. °MAKE SURE YOU:  °· Understand these instructions. °· Will watch your condition. °· Will get help right away if you are not doing well or get worse. °Document Released: 08/17/2005 Document Revised: 11/09/2011 Document Reviewed: 04/21/2013 °[employer]® Patient Information ©2014 [employer]. ° °

## 2013-12-12 NOTE — ED Notes (Addendum)
Patient A&O x4. Still c/o left flank pain radiating towards abdomen 7/10. Pain started yesterday night. Reports hx of kidney stones "ten years ago." States she has "been drinking lots of water."  Doubled glipizide dosage yesterday. Last dose taken at 0645 this morning. BG 333. VSS. Patient resting comfortably. MD in to speak with patient. Pt c/o blurry vision. MD addressed this concern saying "this will take a little while to resolve." Will continue to monitor.

## 2013-12-12 NOTE — ED Notes (Signed)
Pt c/o hyperglycemia x "several weeks" and L side flank radiating into L  abdomen x 1 day.  Pain score 7/10.  Pt denies pain w/ urination and odor.  Pt was seen at [facility] yesterday for hyperglycemia and Pt reports making medication change.  Hx of kidney stones.

## 2013-12-12 NOTE — ED Provider Notes (Signed)
Medical screening examination/treatment/procedure(s) were performed by non-physician practitioner and as supervising physician I was immediately available for consultation/collaboration.  Flint Melter, MD 12/12/13 [phone]

## 2013-12-12 NOTE — ED Provider Notes (Signed)
CSN: [id number]     Arrival date & time 12/12/13  1528 History   First MD Initiated Contact with Patient 12/12/13 1551     Chief Complaint  Patient presents with  . Flank Pain  . Hyperglycemia      HPI  Patient presents with a complaint of left back pain. Seen and evaluated yesterday with high blood sugars. An increase in her medications. Presents today saying her blood sugars are still high, her vision still blurry, she developed some back pain. She states she got home after the emergency room yesterday. She got home and was tired. She laid down. When she rolled over in bed she felt some pain in her left back it hurts to roll over and twist move sit up or walk. It does not radiate into her left lower back. No abdominal pain, no nausea vomiting or diarrhea.  Past Medical History  Diagnosis Date  . Hypertension   . Diabetes mellitus   . PONV (postoperative nausea and vomiting)   . Sleep apnea     lost 70lbs no cpap x63yrs now  . Arthritis     fingers  . Depression   . Dyspnea 11/22/2013  . Kidney stones    Past Surgical History  Procedure Laterality Date  . Cholecystectomy    . Cardiac catheterization      4-57yrs ago  . Endometrial ablation      6 years ago  . Laparoscopic appendectomy  10/04/2011    Procedure: APPENDECTOMY LAPAROSCOPIC;  Surgeon: Rulon Abide, DO;  Location: [facility];  Service: General;  Laterality: N/A;   Family History  Problem Relation Age of Onset  . Aneurysm Mother   . Heart attack Father   . Stroke Father   . Breast cancer Paternal Grandmother    History  Substance Use Topics  . Smoking status: Former Smoker -- 2.00 packs/day for 24 years    Types: Cigarettes    Quit date: 10/08/2013  . Smokeless tobacco: Never Used  . Alcohol Use: Yes     Comment: seldom   OB History   Grav Para Term Preterm Abortions TAB SAB Ect Mult Living                 Review of Systems  Constitutional: Negative for fever, chills, diaphoresis, appetite change,  fatigue and unexpected weight change.  HENT: Negative for mouth sores, sore throat and trouble swallowing.   Eyes: Positive for visual disturbance.  Respiratory: Negative for cough, chest tightness, shortness of breath and wheezing.   Cardiovascular: Negative for chest pain.  Gastrointestinal: Negative for nausea, vomiting, abdominal pain, diarrhea and abdominal distention.  Endocrine: Positive for polydipsia, polyphagia and polyuria.  Genitourinary: Negative for dysuria, frequency and hematuria.  Musculoskeletal: Positive for back pain and myalgias. Negative for gait problem.  Skin: Negative for color change, pallor and rash.  Neurological: Negative for dizziness, syncope, light-headedness and headaches.  Hematological: Does not bruise/bleed easily.  Psychiatric/Behavioral: Negative for behavioral problems and confusion.      Allergies  Codeine and Sulfa antibiotics  Home Medications   Prior to Admission medications   Medication Sig Start Date End Date Taking? Authorizing Provider  albuterol (PROVENTIL HFA;VENTOLIN HFA) 108 (90 BASE) MCG/ACT inhaler Inhale 2 puffs into the lungs every 6 (six) hours as needed for wheezing or shortness of breath.    Yes Historical Provider, MD  diclofenac (VOLTAREN) 75 MG EC tablet Take 75 mg by mouth 2 (two) times daily.   Yes Historical Provider,  MD  esomeprazole (NEXIUM) 20 MG capsule Take 20 mg by mouth daily at 12 noon.   Yes Historical Provider, MD  FLUoxetine (PROZAC) 40 MG capsule Take 40 mg by mouth daily.   Yes Historical Provider, MD  glipiZIDE (GLUCOTROL) 10 MG tablet Take 20 mg by mouth 2 (two) times daily before a meal.   Yes Historical Provider, MD  lisinopril (PRINIVIL,ZESTRIL) 20 MG tablet Take 1 tablet (20 mg total) by mouth daily. 11/07/12  Yes Maryruth Bun Rama, MD  metFORMIN (GLUMETZA) 1000 MG (MOD) 24 hr tablet Take 1 tablet (1,000 mg total) by mouth 2 (two) times daily with a meal. 11/07/12  Yes Christina P Rama, MD   BP 118/74   Pulse 80  Temp(Src) 97.8 F (36.6 C) (Oral)  Resp 18  SpO2 96% Physical Exam  Constitutional: She is oriented to person, place, and time. She appears well-developed and well-nourished. No distress.  HENT:  Head: Normocephalic.  Eyes: Conjunctivae are normal. Pupils are equal, round, and reactive to light. No scleral icterus.  Neck: Normal range of motion. Neck supple. No thyromegaly present.  Cardiovascular: Normal rate and regular rhythm.  Exam reveals no gallop and no friction rub.   No murmur heard. Pulmonary/Chest: Effort normal and breath sounds normal. No respiratory distress. She has no wheezes. She has no rales.  Abdominal: Soft. Bowel sounds are normal. She exhibits no distension. There is no tenderness. There is no rebound.    Musculoskeletal: Normal range of motion.       Back:  Neurological: She is alert and oriented to person, place, and time.  Skin: Skin is warm and dry. No rash noted.  Psychiatric: She has a normal mood and affect. Her behavior is normal.    ED Course  Procedures (including critical care time) Labs Review Labs Reviewed  URINALYSIS, ROUTINE W REFLEX MICROSCOPIC - Abnormal; Notable for the following:    Glucose, UA >1000 (*)    All other components within normal limits  CBG MONITORING, ED - Abnormal; Notable for the following:    Glucose-Capillary 273 (*)    All other components within normal limits  URINE MICROSCOPIC-ADD ON    Imaging Review No results found.   EKG Interpretation None      MDM   Final diagnoses:  Muscular pain    Normal urine other than a glucose. Told her to slowly expect her sugars to improve over the next several days not to expect some improvement with her dosage change yesterday. Her urine as a cellular does not show signed infection. She is resting is in room and appears in no way uncomfortable as I would expect with a kidney stone. Think simple muscle relaxants and expected is all that is needed for her back.  Continue compliance with her diabetic diet and hypoglycemic medications    Rolland Porter, MD 12/14/13 [phone]

## 2014-01-03 ENCOUNTER — Other Ambulatory Visit: Payer: Self-pay | Admitting: Pulmonary Disease

## 2014-01-03 DIAGNOSIS — G4733 Obstructive sleep apnea (adult) (pediatric): Secondary | ICD-10-CM

## 2014-01-04 ENCOUNTER — Telehealth: Payer: Self-pay | Admitting: Pulmonary Disease

## 2014-01-04 NOTE — Telephone Encounter (Signed)
Please advise RA thanks 

## 2014-02-19 ENCOUNTER — Encounter (HOSPITAL_BASED_OUTPATIENT_CLINIC_OR_DEPARTMENT_OTHER): Payer: Medicaid Other

## 2014-03-13 ENCOUNTER — Ambulatory Visit: Payer: Medicaid Other | Admitting: *Deleted

## 2014-04-24 ENCOUNTER — Ambulatory Visit: Payer: Medicaid Other | Admitting: *Deleted

## 2014-06-10 ENCOUNTER — Other Ambulatory Visit (HOSPITAL_COMMUNITY)
Admission: RE | Admit: 2014-06-10 | Discharge: 2014-06-10 | Disposition: A | Discharge status: Home / Self Care | Payer: Medicaid Other | Source: Ambulatory Visit | Attending: Emergency Medicine | Admitting: Emergency Medicine

## 2014-06-10 ENCOUNTER — Encounter (HOSPITAL_COMMUNITY): Payer: Self-pay | Admitting: Emergency Medicine

## 2014-06-10 ENCOUNTER — Emergency Department (HOSPITAL_COMMUNITY)
Admission: EM | Admit: 2014-06-10 | Discharge: 2014-06-10 | Disposition: A | Discharge status: Home / Self Care | Payer: Medicaid Other | Source: Home / Self Care | Attending: Emergency Medicine | Admitting: Emergency Medicine

## 2014-06-10 DIAGNOSIS — L989 Disorder of the skin and subcutaneous tissue, unspecified: Secondary | ICD-10-CM | POA: Diagnosis not present

## 2014-06-10 DIAGNOSIS — N760 Acute vaginitis: Secondary | ICD-10-CM | POA: Diagnosis present

## 2014-06-10 DIAGNOSIS — Z113 Encounter for screening for infections with a predominantly sexual mode of transmission: Secondary | ICD-10-CM | POA: Insufficient documentation

## 2014-06-10 DIAGNOSIS — B373 Candidiasis of vulva and vagina: Secondary | ICD-10-CM

## 2014-06-10 DIAGNOSIS — B3731 Acute candidiasis of vulva and vagina: Secondary | ICD-10-CM

## 2014-06-10 LAB — POCT PREGNANCY, URINE: Preg Test, Ur: NEGATIVE

## 2014-06-10 MED ORDER — FLUCONAZOLE 150 MG PO TABS: 150.0000 mg | ORAL_TABLET | Freq: Once | ORAL | Status: DC

## 2014-06-10 MED ORDER — CEPHALEXIN 500 MG PO CAPS: 500.0000 mg | ORAL_CAPSULE | Freq: Four times a day (QID) | ORAL | Status: DC

## 2014-06-10 NOTE — Discharge Instructions (Signed)
The scalp rash looks like it might be infected bug bites or acne. Take keflex 1 pill 4 times a day for 10 days.  It does look like you have a yeast infection. Take diflucan 1 pill now and the second pill after completing the antibiotics. We tested you for STDs.  We will call if you need additional treatment.  Follow up here or at your primary care office if things do not improve in the next 1-2 weeks.

## 2014-06-10 NOTE — ED Notes (Signed)
C/o scalp irritation x 1 month.  No relief with otc neosporin.    Also c/o possible yeast infection.  Pt has used otc monistat 1 day treatment x 2 with no relief.   Hx DM and recurrent yeast infection.

## 2014-06-10 NOTE — ED Provider Notes (Signed)
CSN: [id number]     Arrival date & time 06/10/14  1332 History   First MD Initiated Contact with Patient 06/10/14 1343     Chief Complaint  Patient presents with  . Vaginal Itching  . Rash   (Consider location/radiation/quality/duration/timing/severity/associated sxs/prior Treatment) HPI She is a 43 year old woman here for evaluation of scalp rash and vaginitis.  She has noticed an itchy rash on her scalp for the last several weeks. It seems to fluctuate a little bit. She has tried using Neosporin with minimal results. She states she had something similar in the past that resolved with an antibiotic.  She had sex one week ago and shortly thereafter developed vaginal itching and thick white discharge. She did who 1 day Monistat treatment with no improvement. She did a second treatment 2 days later, again with no improvement. She has a little bit of right lower quadrant comfort. The sexual intercourse was with a known partner, but they did not use condoms. The relationship is not monogamous. She denies any fevers, chills, nausea, vomiting. She reports having an endometrial ablation 6 years ago.  Past Medical History  Diagnosis Date  . Hypertension   . Diabetes mellitus   . PONV (postoperative nausea and vomiting)   . Sleep apnea     lost 70lbs no cpap x11yrs now  . Arthritis     fingers  . Depression   . Dyspnea 11/22/2013  . Kidney stones    Past Surgical History  Procedure Laterality Date  . Cholecystectomy    . Cardiac catheterization      4-24yrs ago  . Endometrial ablation      6 years ago  . Laparoscopic appendectomy  10/04/2011    Procedure: APPENDECTOMY LAPAROSCOPIC;  Surgeon: Rulon Abide, DO;  Location: [facility];  Service: General;  Laterality: N/A;   Family History  Problem Relation Age of Onset  . Aneurysm Mother   . Heart attack Father   . Stroke Father   . Breast cancer Paternal Grandmother    History  Substance Use Topics  . Smoking status: Former Smoker  -- 2.00 packs/day for 24 years    Types: Cigarettes    Quit date: 10/08/2013  . Smokeless tobacco: Never Used  . Alcohol Use: Yes     Comment: seldom   OB History   Grav Para Term Preterm Abortions TAB SAB Ect Mult Living                 Review of Systems  Constitutional: Negative.   HENT:       Scalp rash  Gastrointestinal: Negative.   Genitourinary: Positive for vaginal discharge and pelvic pain (right sided discomfort). Negative for dysuria and frequency.       Vaginal itching  Neurological: Negative.     Allergies  Codeine and Sulfa antibiotics  Home Medications   Prior to Admission medications   Medication Sig Start Date End Date Taking? Authorizing Provider  albuterol (PROVENTIL HFA;VENTOLIN HFA) 108 (90 BASE) MCG/ACT inhaler Inhale 2 puffs into the lungs every 6 (six) hours as needed for wheezing or shortness of breath.    Yes Historical Provider, MD  diclofenac (VOLTAREN) 75 MG EC tablet Take 75 mg by mouth 2 (two) times daily.   Yes Historical Provider, MD  esomeprazole (NEXIUM) 20 MG capsule Take 20 mg by mouth daily at 12 noon.   Yes Historical Provider, MD  FLUoxetine (PROZAC) 40 MG capsule Take 40 mg by mouth daily.   Yes Historical Provider,  MD  glipiZIDE (GLUCOTROL) 10 MG tablet Take 20 mg by mouth 2 (two) times daily before a meal.   Yes Historical Provider, MD  lisinopril (PRINIVIL,ZESTRIL) 20 MG tablet Take 1 tablet (20 mg total) by mouth daily. 11/07/12  Yes Christina P Rama, MD  cephALEXin (KEFLEX) 500 MG capsule Take 1 capsule (500 mg total) by mouth 4 (four) times daily. 06/10/14   Charm Rings, MD  fluconazole (DIFLUCAN) 150 MG tablet Take 1 tablet (150 mg total) by mouth once. 06/10/14   Charm Rings, MD  metFORMIN (GLUMETZA) 1000 MG (MOD) 24 hr tablet Take 1 tablet (1,000 mg total) by mouth 2 (two) times daily with a meal. 11/07/12   Maryruth Bun Rama, MD  methocarbamol (ROBAXIN) 500 MG tablet Take 1 tablet (500 mg total) by mouth 3 (three) times daily  between meals as needed. 12/12/13   Rolland Porter, MD  naproxen (NAPROSYN) 500 MG tablet Take 1 tablet (500 mg total) by mouth 2 (two) times daily. 12/12/13   Rolland Porter, MD  traMADol (ULTRAM) 50 MG tablet Take 1 tablet (50 mg total) by mouth every 6 (six) hours as needed. 12/12/13   Rolland Porter, MD   BP 121/75  Pulse 92  Temp(Src) 98.5 F (36.9 C) (Oral)  Resp 16  SpO2 95% Physical Exam  Constitutional: She is oriented to person, place, and time. She appears well-developed and well-nourished. No distress.  HENT:  Head: Normocephalic and atraumatic.  Cardiovascular: Normal rate.   Pulmonary/Chest: Effort normal.  Abdominal: Soft. She exhibits no distension. There is no tenderness. There is no rebound and no guarding.  Genitourinary:    Uterus is not tender. Cervix exhibits no motion tenderness and no discharge. Right adnexum displays no mass and no tenderness. Left adnexum displays no mass and no tenderness. No tenderness around the vagina. Vaginal discharge (thick white) found.  Neurological: She is alert and oriented to person, place, and time.  Skin: Skin is warm and dry.  Erythematous papules scattered on scalp, several with deep excoriation    ED Course  Procedures (including critical care time) Labs Review Labs Reviewed  CERVICOVAGINAL ANCILLARY ONLY    Imaging Review No results found.   MDM   1. Yeast vaginitis   2. Scalp lesion    Exam is consistent with a yeast vaginitis. Will treat with Diflucan. Cultures sent and will treat based on results.  Scalp lesions appear to be superinfected acne or bug bites. We'll treat with a course of Keflex.  Followup with PCP if no improvement in 1-2 weeks.     Charm Rings, MD 06/10/14 [phone]

## 2014-06-11 LAB — CERVICOVAGINAL ANCILLARY ONLY
Chlamydia: NEGATIVE
Neisseria Gonorrhea: NEGATIVE
Wet Prep (BD Affirm): NEGATIVE
Wet Prep (BD Affirm): POSITIVE — AB
Wet Prep (BD Affirm): POSITIVE — AB

## 2014-06-13 ENCOUNTER — Telehealth (HOSPITAL_COMMUNITY): Payer: Self-pay | Admitting: *Deleted

## 2014-06-13 MED ORDER — FLUCONAZOLE 150 MG PO TABS: 150.0000 mg | ORAL_TABLET | Freq: Every day | ORAL | Status: DC

## 2014-06-13 MED ORDER — METRONIDAZOLE 500 MG PO TABS: 500.0000 mg | ORAL_TABLET | Freq: Two times a day (BID) | ORAL | Status: DC

## 2014-06-13 NOTE — ED Notes (Signed)
GC/Chlamydia neg., Affirm: Candida and Gardnerella pos., Trich neg. Pt. Adequately treated with Diflucan.  Message to Dr. Piedad Climes. Vassie Moselle 06/13/2014

## 2014-06-13 NOTE — ED Notes (Signed)
Dr. Piedad Climes e-prescribed Flagyl.  I called pt.  Pt. verified x 2 and given results. Pt. told she was adequately treated with Diflucan for yeast and needs Flagyl for bacterial vaginosis.  Pt. instructed to no alcohol while taking this medication.  Pt. told where to pick up her Rx. Pt. voiced understanding. Vassie Moselle 06/13/2014

## 2014-10-01 ENCOUNTER — Encounter: Payer: Medicaid Other | Attending: "Endocrinology | Admitting: Nutrition

## 2014-10-01 ENCOUNTER — Encounter: Payer: Self-pay | Admitting: Nutrition

## 2014-10-01 ENCOUNTER — Ambulatory Visit: Payer: Medicaid Other | Admitting: Nutrition

## 2014-10-01 VITALS — Ht 69.0 in | Wt 269.0 lb

## 2014-10-01 DIAGNOSIS — Z713 Dietary counseling and surveillance: Secondary | ICD-10-CM | POA: Insufficient documentation

## 2014-10-01 DIAGNOSIS — Z794 Long term (current) use of insulin: Secondary | ICD-10-CM | POA: Diagnosis not present

## 2014-10-01 DIAGNOSIS — IMO0002 Reserved for concepts with insufficient information to code with codable children: Secondary | ICD-10-CM

## 2014-10-01 DIAGNOSIS — E1165 Type 2 diabetes mellitus with hyperglycemia: Secondary | ICD-10-CM

## 2014-10-01 DIAGNOSIS — E118 Type 2 diabetes mellitus with unspecified complications: Secondary | ICD-10-CM | POA: Diagnosis not present

## 2014-10-01 NOTE — Patient Instructions (Addendum)
Goals:  Follow Diabetes Meal Plan as instructed  Eat 3 meals   Avoid snacks between meals.  Cut down on Diet sodas and drink more water.  Increase fresh fruits and vegetables.  Bake, broil and grill foods.  Add lean protein foods to meals/snacks  Monitor glucose levels as instructed by your doctor  Aim for 45-60 mins of physical activity 4-5 times per week.  Bring food record and glucose log to your next nutrition visit  Goal: Get A1C down below 9% in three months. 2. Lose 1 lb per week til next visit.  Recommend referral to [facility]

## 2014-10-01 NOTE — Progress Notes (Signed)
  Medical Nutrition Therapy:  Appt start time: 1100 end time:  1130.  Assessment:  Primary concerns today: Diabetes. Walk in from Dr. Fransico Him. Her A1C 12.9%. She admits to overeating, being an emotional eater. Hasn't been compliant with medications. Complains of increased thirst, frequent urination, tired, mood swings, and extreme hunger at times. 20 units Levemir and 10 units of Novolog with meals. Has been eating to prevent low blood sugars. Was taken off of Glipizide today due to fear of low blood sugars. Willing to see a therapist to address her emotional issues causing her to stress and emotionally eat. Encouraged her to talk to her PCP about a referral.. Willing to start walking for exercise to lose weight and improve BS. Willing to take meds as prescribed. Ready to make lifestyle changes to improve her diabetes and overall health. She was started on Metformin 500 mg BID and Invokana 100 once a day.  Preferred Learning Style:    Visual  Hands on   Learning Readiness:   Ready  Change in progress  MEDICATIONS: See list   DIETARY INTAKE:  Eats 1-2 meals per day. Drinks a lot of diet sodas and eats a lot of cheese. Snacks throughout the day. Eats fast food. Doesn't cook much at home  Doesn't drink much water til late at night. Emotional eater. Uses a lot of Splenda.  Usual physical activity: Willing to start walking daily.  Estimated energy needs: 1500 calories 170 g carbohydrates 112 g protein 42 g fat  Progress Towards Goal(s):  In progress.   Nutritional Diagnosis:  NB-1.1 Food and nutrition-related knowledge deficit As related to Diabetes.  As evidenced by A1C 12.9%..    Intervention:  Nutrition Counseling and diabetes education on diet, MY Plate, portion sizes, target ranges for blood sugars and treatment of hypo/hyperglycemia and benefits of exercise and weight loss.  Goals:  Follow Diabetes Meal Plan as instructed  Eat 3 meals   Avoid snacks between meals.  Cut  down on Diet sodas and drink more water.  Increase fresh fruits and vegetables.  Bake, broil and grill foods.  Add lean protein foods to meals/snacks  Monitor glucose levels as instructed by your doctor  Aim for 45-60 mins of physical activity 4-5 times per week.  Bring food record and glucose log to your next nutrition visit  Goal: Get A1C down below 9% in three months.  Recommend referral to Behavioral Health to help with emotional issues.   Teaching Method Utilized:  Visual Auditory Hands on  Handouts given during visit include: My Plate Method  Barriers to learning/adherence to lifestyle change: none  Demonstrated degree of understanding via:  Teach Back   Monitoring/Evaluation:  Dietary intake, exercise, meal planning, and body weight in 1 week(s).

## 2014-10-01 NOTE — Progress Notes (Signed)
Opened in error.  Duplicate encounter.

## 2014-10-08 ENCOUNTER — Encounter: Payer: Medicaid Other | Attending: "Endocrinology | Admitting: Nutrition

## 2014-10-08 VITALS — Ht 69.0 in | Wt 270.0 lb

## 2014-10-08 DIAGNOSIS — Z794 Long term (current) use of insulin: Secondary | ICD-10-CM | POA: Insufficient documentation

## 2014-10-08 DIAGNOSIS — E118 Type 2 diabetes mellitus with unspecified complications: Secondary | ICD-10-CM | POA: Insufficient documentation

## 2014-10-08 DIAGNOSIS — IMO0002 Reserved for concepts with insufficient information to code with codable children: Secondary | ICD-10-CM

## 2014-10-08 DIAGNOSIS — E669 Obesity, unspecified: Secondary | ICD-10-CM

## 2014-10-08 DIAGNOSIS — Z713 Dietary counseling and surveillance: Secondary | ICD-10-CM | POA: Insufficient documentation

## 2014-10-08 DIAGNOSIS — E1165 Type 2 diabetes mellitus with hyperglycemia: Secondary | ICD-10-CM

## 2014-10-08 NOTE — Patient Instructions (Signed)
Goals: Keep up the GREAT JOB!!  Follow Diabetes Meal Plan- The Plate MEthod as instructed  Eat 3 meals per day.  Avoid skipping meals.  Avoid snacks between meals.  Be sure to eat 45 g of carbs per meal to meet nutritional needs and prevent low blood sugars.  Increase physical activity to 30 minutes 4 days per week.  Aim for blood sugars before meals 150 mg/dl or less   Lose 1 lb per week  Get A1C down to 8% in three months..  Continue BS log and food journal and bring at next visit.

## 2014-10-08 NOTE — Patient Instructions (Signed)
Goals:  Follow Diabetes Meal Plan as instructed  Eat 3 mealsLimit carbohydrate intake to 45 grams carbohydrate/meal  Avoid snacks between meals  Increase water intake  Increase fresh fruits and vegetables.  Avoid snacks and processed foods  Add lean protein foods to meals/snacks  Monitor glucose levels as instructed by your doctor  Keep a food journal and BS log  Aim for 30 mins of physical activity daily  Bring food record and glucose log to your next nutrition visit  Get A1C down to 8% in three months.  Lose 1 lb per week til next visit.

## 2014-10-08 NOTE — Progress Notes (Signed)
  Medical Nutrition Therapy:  Appt start time: 1100 end time:  1130.  Assessment:  Primary concerns today: Diabetes Follow up.  She is upset that she didn't lose any weight. She notes she has changed a lot of of what she is eating. She hasn't started exercising. Complains of left upper breast pain.  She has cut down to No splenda in her coffee and non dairy creamer sugar free. Going to a hypnotist to quit smoking. Will start going to the gym this week.   Diet recall reveals she has drastically changed her eating habits. Drinking water only. No diet sodas. Eating all fresh fruits and vegetables and lean meats. But too restrictive in CHO at meals. BS are much better: FBS down from uppper 300's down to 190-230's now. Before luknch 735-329 and before dinner 140-255 mg/dl.    Taking 50 units of Levemir and 10 units of Novolog plus sliding scale with meals. Feels better, less tired, more energy and has already seen a dramtic difference in her taste buds. No cravings.  Complains of some discomfort over her right breast/shoulder area [place].. Will ask Dr. Fransico Him to evaluate briefly when he sees her today.  Brought food journal and BS log. No soda. Doing all water and coffee;  Preferred Learning Style:    Visual  Hands on   Learning Readiness:   Ready  Change in progress  MEDICATIONS: See list   DIETARY INTAKE:  Eats 1-2 meals per day. Drinks a lot of diet sodas and eats a lot of cheese. Snacks throughout the day. Eats fast food. Doesn't cook much at home  Doesn't drink much water til late at night. Emotional eater. Uses a lot of Splenda.  Usual physical activity: Willing to start walking daily.  Estimated energy needs: 1500 calories 170 g carbohydrates 112 g protein 42 g fat  Progress Towards Goal(s):  In progress.   Nutritional Diagnosis:  NB-1.1 Food and nutrition-related knowledge deficit As related to Diabetes.  As evidenced by A1C 12.9%..     Intervention:  Nutrition Counseling and diabetes education on diet, MY Plate, portion sizes, target ranges for blood sugars and treatment of hypo/hyperglycemia and benefits of exercise and weight loss. CHO counting and importance of CHO at meals and need to start exercising to lose weight.  Goals: Keep up the GREAT JOB!!  Follow Diabetes Meal Plan- The Plate MEthod as instructed  Eat 3 meals per day.  Avoid skipping meals.  Avoid snacks between meals.  Be sure to eat 45 g of carbs per meal to meet nutritional needs and prevent low blood sugars.  Increase physical activity to 30 minutes 4 days per week.  Aim for blood sugars before meals 150 mg/dl or less   Lose 1 lb per week  Get A1C down to 8% in three months..  Continue BS log and food journal and bring at next visit.  Teaching Method Utilized:  Visual Auditory Hands on  Handouts given during visit include: My Plate Method Weight loss tips Meal Plan card Barriers to learning/adherence to lifestyle change: none  Demonstrated degree of understanding via:  Teach Back   Monitoring/Evaluation:  Dietary intake, exercise, meal planning, and body weight in 1 week(s).

## 2014-11-15 ENCOUNTER — Encounter: Payer: Medicaid Other | Attending: "Endocrinology | Admitting: Nutrition

## 2014-11-15 DIAGNOSIS — E118 Type 2 diabetes mellitus with unspecified complications: Secondary | ICD-10-CM | POA: Insufficient documentation

## 2014-11-15 DIAGNOSIS — Z794 Long term (current) use of insulin: Secondary | ICD-10-CM | POA: Insufficient documentation

## 2014-11-15 DIAGNOSIS — Z713 Dietary counseling and surveillance: Secondary | ICD-10-CM | POA: Insufficient documentation

## 2014-11-19 ENCOUNTER — Emergency Department (HOSPITAL_COMMUNITY): Payer: Medicaid Other

## 2014-11-19 ENCOUNTER — Encounter (HOSPITAL_COMMUNITY): Payer: Self-pay | Admitting: Emergency Medicine

## 2014-11-19 ENCOUNTER — Emergency Department (HOSPITAL_COMMUNITY)
Admission: EM | Admit: 2014-11-19 | Discharge: 2014-11-19 | Disposition: A | Discharge status: Home / Self Care | Payer: Medicaid Other | Attending: Emergency Medicine | Admitting: Emergency Medicine

## 2014-11-19 ENCOUNTER — Other Ambulatory Visit: Payer: Self-pay

## 2014-11-19 DIAGNOSIS — Z792 Long term (current) use of antibiotics: Secondary | ICD-10-CM | POA: Insufficient documentation

## 2014-11-19 DIAGNOSIS — Z87442 Personal history of urinary calculi: Secondary | ICD-10-CM | POA: Diagnosis not present

## 2014-11-19 DIAGNOSIS — M791 Myalgia: Secondary | ICD-10-CM | POA: Insufficient documentation

## 2014-11-19 DIAGNOSIS — R079 Chest pain, unspecified: Secondary | ICD-10-CM | POA: Insufficient documentation

## 2014-11-19 DIAGNOSIS — Z9889 Other specified postprocedural states: Secondary | ICD-10-CM | POA: Insufficient documentation

## 2014-11-19 DIAGNOSIS — Z87891 Personal history of nicotine dependence: Secondary | ICD-10-CM | POA: Insufficient documentation

## 2014-11-19 DIAGNOSIS — Z9981 Dependence on supplemental oxygen: Secondary | ICD-10-CM | POA: Diagnosis not present

## 2014-11-19 DIAGNOSIS — K859 Acute pancreatitis, unspecified: Secondary | ICD-10-CM | POA: Diagnosis not present

## 2014-11-19 DIAGNOSIS — Z794 Long term (current) use of insulin: Secondary | ICD-10-CM | POA: Insufficient documentation

## 2014-11-19 DIAGNOSIS — E119 Type 2 diabetes mellitus without complications: Secondary | ICD-10-CM | POA: Diagnosis not present

## 2014-11-19 DIAGNOSIS — R0602 Shortness of breath: Secondary | ICD-10-CM | POA: Insufficient documentation

## 2014-11-19 DIAGNOSIS — Z79899 Other long term (current) drug therapy: Secondary | ICD-10-CM | POA: Insufficient documentation

## 2014-11-19 DIAGNOSIS — G4730 Sleep apnea, unspecified: Secondary | ICD-10-CM | POA: Diagnosis not present

## 2014-11-19 DIAGNOSIS — F329 Major depressive disorder, single episode, unspecified: Secondary | ICD-10-CM | POA: Insufficient documentation

## 2014-11-19 DIAGNOSIS — Z791 Long term (current) use of non-steroidal anti-inflammatories (NSAID): Secondary | ICD-10-CM | POA: Insufficient documentation

## 2014-11-19 DIAGNOSIS — I10 Essential (primary) hypertension: Secondary | ICD-10-CM | POA: Diagnosis not present

## 2014-11-19 DIAGNOSIS — Z9049 Acquired absence of other specified parts of digestive tract: Secondary | ICD-10-CM | POA: Diagnosis not present

## 2014-11-19 DIAGNOSIS — M1990 Unspecified osteoarthritis, unspecified site: Secondary | ICD-10-CM | POA: Diagnosis not present

## 2014-11-19 LAB — COMPREHENSIVE METABOLIC PANEL
ALT: 22 U/L (ref 0–35)
AST: 18 U/L (ref 0–37)
Albumin: 3.6 g/dL (ref 3.5–5.2)
Alkaline Phosphatase: 83 U/L (ref 39–117)
Anion gap: 9 (ref 5–15)
BUN: 40 mg/dL — ABNORMAL HIGH (ref 6–23)
CO2: 27 mmol/L (ref 19–32)
Calcium: 9 mg/dL (ref 8.4–10.5)
Chloride: 101 mmol/L (ref 96–112)
Creatinine, Ser: 1.29 mg/dL — ABNORMAL HIGH (ref 0.50–1.10)
GFR calc Af Amer: 57 mL/min — ABNORMAL LOW (ref >90)
GFR calc non Af Amer: 49 mL/min — ABNORMAL LOW (ref >90)
Glucose, Bld: 185 mg/dL — ABNORMAL HIGH (ref 70–99)
Potassium: 4.5 mmol/L (ref 3.5–5.1)
Sodium: 137 mmol/L (ref 135–145)
Total Bilirubin: 0.3 mg/dL (ref 0.3–1.2)
Total Protein: 6.1 g/dL (ref 6.0–8.3)

## 2014-11-19 LAB — CBC WITH DIFFERENTIAL/PLATELET
Basophils Absolute: 0 10*3/uL (ref 0.0–0.1)
Basophils Relative: 0 % (ref 0–1)
Eosinophils Absolute: 0.1 10*3/uL (ref 0.0–0.7)
Eosinophils Relative: 1 % (ref 0–5)
HCT: 38.4 % (ref 36.0–46.0)
Hemoglobin: 12.8 g/dL (ref 12.0–15.0)
Lymphocytes Relative: 35 % (ref 12–46)
Lymphs Abs: 3.8 10*3/uL (ref 0.7–4.0)
MCH: 29 pg (ref 26.0–34.0)
MCHC: 33.3 g/dL (ref 30.0–36.0)
MCV: 86.9 fL (ref 78.0–100.0)
Monocytes Absolute: 0.4 10*3/uL (ref 0.1–1.0)
Monocytes Relative: 4 % (ref 3–12)
Neutro Abs: 6.4 10*3/uL (ref 1.7–7.7)
Neutrophils Relative %: 60 % (ref 43–77)
Platelets: 248 10*3/uL (ref 150–400)
RBC: 4.42 MIL/uL (ref 3.87–5.11)
RDW: 12.3 % (ref 11.5–15.5)
WBC: 10.6 10*3/uL — ABNORMAL HIGH (ref 4.0–10.5)

## 2014-11-19 LAB — I-STAT TROPONIN, ED
Troponin i, poc: 0 ng/mL (ref 0.00–0.08)
Troponin i, poc: 0 ng/mL (ref 0.00–0.08)

## 2014-11-19 LAB — LIPASE, BLOOD: Lipase: 194 U/L — ABNORMAL HIGH (ref 11–59)

## 2014-11-19 LAB — BRAIN NATRIURETIC PEPTIDE: B Natriuretic Peptide: 17.5 pg/mL (ref 0.0–100.0)

## 2014-11-19 MED ORDER — ONDANSETRON 8 MG PO TBDP: 8.0000 mg | ORAL_TABLET | Freq: Three times a day (TID) | ORAL | Status: DC | PRN

## 2014-11-19 MED ORDER — HYDROMORPHONE HCL 1 MG/ML IJ SOLN
0.5000 mg | Freq: Once | INTRAMUSCULAR | Status: AC
  Administered 2014-11-19: 0.5 mg via INTRAVENOUS
  Filled 2014-11-19: qty 1

## 2014-11-19 MED ORDER — SODIUM CHLORIDE 0.9 % IV BOLUS (SEPSIS)
1000.0000 mL | Freq: Once | INTRAVENOUS | Status: AC
  Administered 2014-11-19: 1000 mL via INTRAVENOUS

## 2014-11-19 MED ORDER — ONDANSETRON HCL 4 MG/2ML IJ SOLN
4.0000 mg | Freq: Once | INTRAMUSCULAR | Status: AC
  Administered 2014-11-19: 4 mg via INTRAVENOUS
  Filled 2014-11-19: qty 2

## 2014-11-19 MED ORDER — NITROGLYCERIN 0.4 MG SL SUBL
0.4000 mg | SUBLINGUAL_TABLET | SUBLINGUAL | Status: DC | PRN
  Administered 2014-11-19: 0.4 mg via SUBLINGUAL

## 2014-11-19 MED ORDER — HYDROCODONE-ACETAMINOPHEN 5-325 MG PO TABS: 1.0000 | ORAL_TABLET | Freq: Four times a day (QID) | ORAL | Status: DC | PRN

## 2014-11-19 NOTE — ED Notes (Signed)
Pt had a sudden onset of left sided chest pressure that started while sitting at her computer this morning. 324 mg and 1 nitro tablet given prior to arrival to ED. Pt rates pain 7/10 in left chest and radiates to left arm and jaw. EKG done and given to MD. Pt had similar episode on fri and Saturday.

## 2014-11-19 NOTE — ED Provider Notes (Signed)
CSN: [id number]     Arrival date & time 11/19/14  1152 History   First MD Initiated Contact with Patient 11/19/14 1201     Chief Complaint  Patient presents with  . Chest Pain     (Consider location/radiation/quality/duration/timing/severity/associated sxs/prior Treatment) HPI [name] is a 44 y.o. female who presents to emergency department complaining of chest pain. Patient states she has had intermittent left-sided chest pain for the last 3 days. States pain comes and goes. Independent of activity. Admits to associated shortness of breath, dizziness, nausea. States couple days ago felt like it was musculoskeletal, states when she had a brought on it worsened the pain. Also that it may be acid reflux because she had some epigastric pain and nausea as well. States today pain started radiating into the left arm and left neck. States her left hand feels "numb." She denies any injuries. She denies any back pain. Pain is not pleuritic. She denies any recent travel or surgeries. Patient states she took aspirin at home, was given one nitroglycerin by EMS which relieved all of her pain, but states it is now back. Patient admits to prior cardiac workup, states she has seen cardiologist and had a cardiac cath and stress test in the past. She is unsure of the results. Denies any other complaints.  Past Medical History  Diagnosis Date  . Hypertension   . Diabetes mellitus   . PONV (postoperative nausea and vomiting)   . Sleep apnea     lost 70lbs no cpap x41yrs now  . Arthritis     fingers  . Depression   . Dyspnea 11/22/2013  . Kidney stones    Past Surgical History  Procedure Laterality Date  . Cholecystectomy    . Cardiac catheterization      4-39yrs ago  . Endometrial ablation      6 years ago  . Laparoscopic appendectomy  10/04/2011    Procedure: APPENDECTOMY LAPAROSCOPIC;  Surgeon: Rulon Abide, DO;  Location: [facility];  Service: General;  Laterality: N/A;   Family History   Problem Relation Age of Onset  . Aneurysm Mother   . Heart attack Father   . Stroke Father   . Breast cancer Paternal Grandmother    History  Substance Use Topics  . Smoking status: Former Smoker -- 2.00 packs/day for 24 years    Types: Cigarettes    Quit date: 10/08/2013  . Smokeless tobacco: Never Used  . Alcohol Use: Yes     Comment: seldom   OB History    No data available     Review of Systems  Constitutional: Negative for fever and chills.  Respiratory: Positive for chest tightness and shortness of breath. Negative for cough.   Cardiovascular: Positive for chest pain. Negative for palpitations and leg swelling.  Gastrointestinal: Positive for nausea and abdominal pain. Negative for vomiting and diarrhea.  Genitourinary: Negative for dysuria, flank pain and pelvic pain.  Musculoskeletal: Positive for myalgias. Negative for neck pain and neck stiffness.  Skin: Negative for rash.  Neurological: Positive for dizziness and light-headedness. Negative for weakness and headaches.  All other systems reviewed and are negative.     Allergies  Codeine and Sulfa antibiotics  Home Medications   Prior to Admission medications   Medication Sig Start Date End Date Taking? Authorizing Provider  albuterol (PROVENTIL HFA;VENTOLIN HFA) 108 (90 BASE) MCG/ACT inhaler Inhale 2 puffs into the lungs every 6 (six) hours as needed for wheezing or shortness of breath.  Historical Provider, MD  canagliflozin (INVOKANA) 100 MG TABS tablet Take 100 mg by mouth.    Historical Provider, MD  cephALEXin (KEFLEX) 500 MG capsule Take 1 capsule (500 mg total) by mouth 4 (four) times daily. 06/10/14   Charm Rings, MD  diclofenac (VOLTAREN) 75 MG EC tablet Take 75 mg by mouth 2 (two) times daily.    Historical Provider, MD  esomeprazole (NEXIUM) 20 MG capsule Take 20 mg by mouth daily at 12 noon.    Historical Provider, MD  fluconazole (DIFLUCAN) 150 MG tablet Take 1 tablet (150 mg total) by mouth  once. 06/10/14   Charm Rings, MD  fluconazole (DIFLUCAN) 150 MG tablet Take 1 tablet (150 mg total) by mouth daily. 06/13/14   Charm Rings, MD  FLUoxetine (PROZAC) 40 MG capsule Take 40 mg by mouth daily.    Historical Provider, MD  glipiZIDE (GLUCOTROL) 10 MG tablet Take 20 mg by mouth 2 (two) times daily before a meal.    Historical Provider, MD  insulin aspart (NOVOLOG) 100 UNIT/ML injection Inject 10 Units into the skin 3 (three) times daily with meals.    Historical Provider, MD  insulin detemir (LEVEMIR) 100 UNIT/ML injection Inject 50 Units into the skin.    Historical Provider, MD  lisinopril (PRINIVIL,ZESTRIL) 20 MG tablet Take 1 tablet (20 mg total) by mouth daily. 11/07/12   Maryruth Bun Rama, MD  metFORMIN (GLUCOPHAGE) 500 MG tablet Take 500 mg by mouth 2 (two) times daily with a meal.    Historical Provider, MD  metFORMIN (GLUMETZA) 1000 MG (MOD) 24 hr tablet Take 1 tablet (1,000 mg total) by mouth 2 (two) times daily with a meal. Patient not taking: Reported on 10/01/2014 11/07/12   Maryruth Bun Rama, MD  methocarbamol (ROBAXIN) 500 MG tablet Take 1 tablet (500 mg total) by mouth 3 (three) times daily between meals as needed. 12/12/13   Rolland Porter, MD  metroNIDAZOLE (FLAGYL) 500 MG tablet Take 1 tablet (500 mg total) by mouth 2 (two) times daily. 06/13/14   Charm Rings, MD  naproxen (NAPROSYN) 500 MG tablet Take 1 tablet (500 mg total) by mouth 2 (two) times daily. 12/12/13   Rolland Porter, MD  traMADol (ULTRAM) 50 MG tablet Take 1 tablet (50 mg total) by mouth every 6 (six) hours as needed. 12/12/13   Rolland Porter, MD   BP 107/66 mmHg  Pulse 73  Temp(Src) 97.8 F (36.6 C) (Oral)  Resp 16  Ht 5\' 9"  (1.753 m)  Wt 270 lb (122.471 kg)  BMI 39.85 kg/m2  SpO2 100% Physical Exam  Constitutional: She is oriented to person, place, and time. She appears well-developed and well-nourished. No distress.  HENT:  Head: Normocephalic.  Eyes: Conjunctivae are normal.  Neck: Neck supple.   Cardiovascular: Normal rate, regular rhythm and normal heart sounds.   Pulmonary/Chest: Effort normal and breath sounds normal. No respiratory distress. She has no wheezes. She has no rales.  Abdominal: Soft. Bowel sounds are normal. She exhibits no distension. There is tenderness. There is no rebound.  Epigastric tenderness  Musculoskeletal: She exhibits no edema.  Neurological: She is alert and oriented to person, place, and time.  Skin: Skin is warm and dry.  Psychiatric: She has a normal mood and affect. Her behavior is normal.  Nursing note and vitals reviewed.   ED Course  Procedures (including critical care time) Labs Review Labs Reviewed  CBC WITH DIFFERENTIAL/PLATELET - Abnormal; Notable for the following:    WBC  10.6 (*)    All other components within normal limits  COMPREHENSIVE METABOLIC PANEL - Abnormal; Notable for the following:    Glucose, Bld 185 (*)    BUN 40 (*)    Creatinine, Ser 1.29 (*)    GFR calc non Af Amer 49 (*)    GFR calc Af Amer 57 (*)    All other components within normal limits  LIPASE, BLOOD - Abnormal; Notable for the following:    Lipase 194 (*)    All other components within normal limits  BRAIN NATRIURETIC PEPTIDE  I-STAT TROPOININ, ED  I-STAT TROPOININ, ED  Rosezena Sensor, ED    Imaging Review Dg Chest 2 View  11/19/2014   CLINICAL DATA:  Chest pain, sudden onset on the left.  EXAM: CHEST  2 VIEW  COMPARISON:  10/30/2013  FINDINGS: Normal heart size and mediastinal contours. No acute infiltrate or edema. No effusion or pneumothorax. No acute osseous findings.  IMPRESSION: Negative chest.   Electronically Signed   By: Marnee Spring M.D.   On: 11/19/2014 13:25     EKG Interpretation   Date/Time:  Monday November 19 2014 11:57:07 EDT Ventricular Rate:  70 PR Interval:  158 QRS Duration: 89 QT Interval:  371 QTC Calculation: 400 R Axis:   89 Text Interpretation:  Sinus rhythm No significant change was found  Confirmed by CAMPOS   MD, Caryn Bee ([id number]) on 11/19/2014 3:15:37 PM      MDM   Final diagnoses:  Acute pancreatitis, unspecified pancreatitis type  Chest pain, unspecified chest pain type    Pt with atypical left sided chest pain that started early this morning. Pain is constant. Associated epigastric pain, left shoulder pain. Similar episodes in the last 3 days. Will get labs, CXR, trop.    4:33 PM Patient workup unremarkable except for glucose of 185, elevated BUN/creatinine at 40 and 1.29, elevated lipase of 194. 2 troponins obtained both are negative. I do not think patient's symptoms are from acute coronary syndrome, most likely mild pancreatitis. Patient hydrated with IV fluids in the emergency department. I offered her admission for chest pain rule out and further workup of her pancreatitis, she refused. She states she has an appointment with her endocrinologist tomorrow. She states she will come back if she is feeling worse.  We'll discharge home with pain medications, antiemetics, follow up tomorrow. Return precautions discussed.  Filed Vitals:   11/19/14 1500 11/19/14 1515 11/19/14 1530 11/19/14 1545  BP: 105/61 104/51 107/69 107/66  Pulse: 69 37 69 69  Temp:      TempSrc:      Resp: 14 16 17 18   Height:      Weight:      SpO2: 100% 92% 100% 97%       , PA-C 11/19/14 1634  11/21/14, PA-C 11/19/14 1635  11/21/14, MD 11/20/14 [phone]

## 2014-11-19 NOTE — ED Notes (Signed)
Patient transported to X-ray 

## 2014-11-19 NOTE — ED Notes (Signed)
Pharmacy tech at bedside 

## 2014-11-19 NOTE — Discharge Instructions (Signed)
norco for severe pain. Zofran for nausea. Clear liquids for 48 hrs. Follow up with your doctor for recheck. Return if worsening.    Acute Pancreatitis Acute pancreatitis is a disease in which the pancreas becomes suddenly inflamed. The pancreas is a large gland located behind your stomach. The pancreas produces enzymes that help digest food. The pancreas also releases the hormones glucagon and insulin that help regulate blood sugar. Damage to the pancreas occurs when the digestive enzymes from the pancreas are activated and begin attacking the pancreas before being released into the intestine. Most acute attacks last a couple of days and can cause serious complications. Some people become dehydrated and develop low blood pressure. In severe cases, bleeding into the pancreas can lead to shock and can be life-threatening. The lungs, heart, and kidneys may fail. CAUSES  Pancreatitis can happen to anyone. In some cases, the cause is unknown. Most cases are caused by:  Alcohol abuse.  Gallstones. Other less common causes are:  Certain medicines.  Exposure to certain chemicals.  Infection.  Damage caused by an accident (trauma).  Abdominal surgery. SYMPTOMS   Pain in the upper abdomen that may radiate to the back.  Tenderness and swelling of the abdomen.  Nausea and vomiting. DIAGNOSIS  Your caregiver will perform a physical exam. Blood and stool tests may be done to confirm the diagnosis. Imaging tests may also be done, such as X-rays, CT scans, or an ultrasound of the abdomen. TREATMENT  Treatment usually requires a stay in the hospital. Treatment may include:  Pain medicine.  Fluid replacement through an intravenous line (IV).  Placing a tube in the stomach to remove stomach contents and control vomiting.  Not eating for 3 or 4 days. This gives your pancreas a rest, because enzymes are not being produced that can cause further damage.  Antibiotic medicines if your condition is  caused by an infection.  Surgery of the pancreas or gallbladder. HOME CARE INSTRUCTIONS   Follow the diet advised by your caregiver. This may involve avoiding alcohol and decreasing the amount of fat in your diet.  Eat smaller, more frequent meals. This reduces the amount of digestive juices the pancreas produces.  Drink enough fluids to keep your urine clear or pale yellow.  Only take over-the-counter or prescription medicines as directed by your caregiver.  Avoid drinking alcohol if it caused your condition.  Do not smoke.  Get plenty of rest.  Check your blood sugar at home as directed by your caregiver.  Keep all follow-up appointments as directed by your caregiver. SEEK MEDICAL CARE IF:   You do not recover as quickly as expected.  You develop new or worsening symptoms.  You have persistent pain, weakness, or nausea.  You recover and then have another episode of pain. SEEK IMMEDIATE MEDICAL CARE IF:   You are unable to eat or keep fluids down.  Your pain becomes severe.  You have a fever or persistent symptoms for more than 2 to 3 days.  You have a fever and your symptoms suddenly get worse.  Your skin or the white part of your eyes turn yellow (jaundice).  You develop vomiting.  You feel dizzy, or you faint.  Your blood sugar is high (over 300 mg/dL). MAKE SURE YOU:   Understand these instructions.  Will watch your condition.  Will get help right away if you are not doing well or get worse. Document Released: 08/17/2005 Document Revised: 02/16/2012 Document Reviewed: 11/26/2011 Exit[employer] Patient Information 2015 [employer]. This information is not intended to replace advice given to you by your health care provider. Make sure you discuss any questions you have with your health care provider.

## 2015-01-07 ENCOUNTER — Other Ambulatory Visit (HOSPITAL_COMMUNITY): Payer: Self-pay | Admitting: Nurse Practitioner

## 2015-01-07 DIAGNOSIS — Z1231 Encounter for screening mammogram for malignant neoplasm of breast: Secondary | ICD-10-CM

## 2015-01-31 ENCOUNTER — Ambulatory Visit (HOSPITAL_COMMUNITY)
Admission: RE | Admit: 2015-01-31 | Discharge: 2015-01-31 | Disposition: A | Discharge status: Home / Self Care | Payer: Medicaid Other | Source: Ambulatory Visit | Attending: Nurse Practitioner | Admitting: Nurse Practitioner

## 2015-01-31 DIAGNOSIS — Z1231 Encounter for screening mammogram for malignant neoplasm of breast: Secondary | ICD-10-CM | POA: Diagnosis not present

## 2015-02-25 ENCOUNTER — Other Ambulatory Visit: Payer: Self-pay

## 2015-11-07 ENCOUNTER — Encounter (HOSPITAL_COMMUNITY): Payer: Self-pay | Admitting: Emergency Medicine

## 2015-11-07 ENCOUNTER — Emergency Department (HOSPITAL_COMMUNITY)
Admission: EM | Admit: 2015-11-07 | Discharge: 2015-11-07 | Disposition: A | Discharge status: Home / Self Care | Payer: Medicaid Other | Source: Home / Self Care | Attending: Emergency Medicine | Admitting: Emergency Medicine

## 2015-11-07 DIAGNOSIS — J018 Other acute sinusitis: Secondary | ICD-10-CM

## 2015-11-07 MED ORDER — AZITHROMYCIN 250 MG PO TABS: ORAL_TABLET | ORAL | Status: DC

## 2015-11-07 MED ORDER — PREDNISONE 50 MG PO TABS: ORAL_TABLET | ORAL | Status: DC

## 2015-11-07 MED ORDER — FLUCONAZOLE 150 MG PO TABS: 150.0000 mg | ORAL_TABLET | Freq: Once | ORAL | Status: DC

## 2015-11-07 NOTE — ED Notes (Signed)
Pt has been suffering from left sinus pain and pressure and a cough for two weeks.  Pt denies any fever.

## 2015-11-07 NOTE — Discharge Instructions (Signed)
You have a sinus infection. Please continue the netty pot and Mucinex. Take prednisone daily for 5 days. Take azithromycin as prescribed. Use the Diflucan if you develop a yeast infection. Follow-up as needed.

## 2015-11-07 NOTE — ED Provider Notes (Signed)
CSN: [id number]     Arrival date & time 11/07/15  1510 History   First MD Initiated Contact with Patient 11/07/15 1625     Chief Complaint  Patient presents with  . Facial Pain  . Cough   (Consider location/radiation/quality/duration/timing/severity/associated sxs/prior Treatment) HPI  [name] is a 45 year old woman here for evaluation of left sinus pain. [name] states her symptoms started about 2 weeks ago with nasal congestion, postnasal drainage, rhinorrhea, and cough. [name] has been using an Nettie pot and Mucinex DM without improvement. Her symptoms have gradually been worsening. Today, [name] reports left maxillary sinus pain and pressure. [name] states the right sinuses unclear with the Nettie pot, but the left are yellow/green and gummy. [name] reports popping in her left ear, but denies pain. No known fevers or chills. [name] does report a history of frequent sinus infections.  Past Medical History  Diagnosis Date  . Hypertension   . Diabetes mellitus   . PONV (postoperative nausea and vomiting)   . Sleep apnea     lost 70lbs no cpap x58yrs now  . Arthritis     fingers  . Depression   . Dyspnea 11/22/2013  . Kidney stones    Past Surgical History  Procedure Laterality Date  . Cholecystectomy    . Cardiac catheterization      4-28yrs ago  . Endometrial ablation      6 years ago  . Laparoscopic appendectomy  10/04/2011    Procedure: APPENDECTOMY LAPAROSCOPIC;  Surgeon: Rulon Abide, DO;  Location: [facility];  Service: General;  Laterality: N/A;   Family History  Problem Relation Age of Onset  . Aneurysm Mother   . Heart attack Father   . Stroke Father   . Breast cancer Paternal Grandmother    Social History  Substance Use Topics  . Smoking status: Current Every Day Smoker -- 1.00 packs/day for 24 years    Types: Cigarettes  . Smokeless tobacco: Never Used  . Alcohol Use: Yes     Comment: seldom   OB History    No data available     Review of Systems As in history of present  illness Allergies  Codeine and Sulfa antibiotics  Home Medications   Prior to Admission medications   Medication Sig Start Date End Date Taking? Authorizing Provider  FLUoxetine (PROZAC) 40 MG capsule Take 40 mg by mouth daily.   Yes Historical Provider, MD  gabapentin (NEURONTIN) 300 MG capsule Take 300 mg by mouth every evening. 10/28/14  Yes Historical Provider, MD  lisinopril (PRINIVIL,ZESTRIL) 20 MG tablet Take 1 tablet (20 mg total) by mouth daily. 11/07/12  Yes Maryruth Bun Rama, MD  metFORMIN (GLUCOPHAGE) 500 MG tablet Take 500 mg by mouth 2 (two) times daily with a meal.   Yes Historical Provider, MD  albuterol (PROVENTIL HFA;VENTOLIN HFA) 108 (90 BASE) MCG/ACT inhaler Inhale 2 puffs into the lungs every 6 (six) hours as needed for wheezing or shortness of breath.     Historical Provider, MD  aspirin 81 MG tablet Take 81 mg by mouth daily.    Historical Provider, MD  atorvastatin (LIPITOR) 80 MG tablet Take 80 mg by mouth daily.    Historical Provider, MD  azithromycin (ZITHROMAX Z-PAK) 250 MG tablet Take 2 pills today, then 1 pill daily until gone. 11/07/15   Charm Rings, MD  canagliflozin (INVOKANA) 100 MG TABS tablet Take 100 mg by mouth.    Historical Provider, MD  diclofenac (VOLTAREN) 75 MG EC tablet Take  75 mg by mouth 2 (two) times daily.    Historical Provider, MD  esomeprazole (NEXIUM) 20 MG capsule Take 20 mg by mouth daily at 12 noon.    Historical Provider, MD  fluconazole (DIFLUCAN) 150 MG tablet Take 1 tablet (150 mg total) by mouth once. Take 2nd pill if symptoms not completely resolved in 3 days. 11/07/15   Charm Rings, MD  HYDROcodone-acetaminophen (NORCO) 5-325 MG per tablet Take 1 tablet by mouth every 6 (six) hours as needed. 11/19/14   Tatyana Kirichenko, PA-C  insulin aspart (NOVOLOG) 100 UNIT/ML injection Inject 14 Units into the skin 3 (three) times daily with meals. Sliding scale    Historical Provider, MD  insulin detemir (LEVEMIR) 100 UNIT/ML injection Inject 60  Units into the skin at bedtime.     Historical Provider, MD  methocarbamol (ROBAXIN) 500 MG tablet Take 1 tablet (500 mg total) by mouth 3 (three) times daily between meals as needed. 12/12/13   Rolland Porter, MD  naproxen (NAPROSYN) 500 MG tablet Take 1 tablet (500 mg total) by mouth 2 (two) times daily. 12/12/13   Rolland Porter, MD  ondansetron (ZOFRAN ODT) 8 MG disintegrating tablet Take 1 tablet (8 mg total) by mouth every 8 (eight) hours as needed for nausea or vomiting. 11/19/14   Jaynie Crumble, PA-C  predniSONE (DELTASONE) 50 MG tablet Take 1 pill daily for 5 days. 11/07/15   Charm Rings, MD  traMADol (ULTRAM) 50 MG tablet Take 1 tablet (50 mg total) by mouth every 6 (six) hours as needed. 12/12/13   Rolland Porter, MD   Meds Ordered and Administered this Visit  Medications - No data to display  BP 114/78 mmHg  Pulse 91  Temp(Src) 98.3 F (36.8 C) (Oral)  Resp 16  SpO2 99% No data found.   Physical Exam  Constitutional: [name] is oriented to person, place, and time. [name] appears well-developed and well-nourished. No distress.  HENT:  Mouth/Throat: No oropharyngeal exudate.  Small amount of postnasal drainage. TMs normal bilaterally. Nasal mucosa is slightly erythematous. There is looking drainage in the left naris.  Neck: Neck supple.  Cardiovascular: Normal rate, regular rhythm and normal heart sounds.   No murmur heard. Pulmonary/Chest: Effort normal and breath sounds normal. No respiratory distress. [name] has no wheezes. [name] has no rales.  Lymphadenopathy:    [name] has no cervical adenopathy.  Neurological: [name] is alert and oriented to person, place, and time.    ED Course  Procedures (including critical care time)  Labs Review Labs Reviewed - No data to display  Imaging Review No results found.    MDM   1. Other acute sinusitis    Will treat with azithromycin and prednisone. Follow-up as needed.    Charm Rings, MD 11/07/15 [phone]

## 2016-01-03 ENCOUNTER — Other Ambulatory Visit: Payer: Self-pay | Admitting: Otolaryngology

## 2016-01-03 DIAGNOSIS — H9041 Sensorineural hearing loss, unilateral, right ear, with unrestricted hearing on the contralateral side: Secondary | ICD-10-CM

## 2016-01-03 DIAGNOSIS — IMO0001 Reserved for inherently not codable concepts without codable children: Secondary | ICD-10-CM

## 2016-01-14 ENCOUNTER — Ambulatory Visit
Admission: RE | Admit: 2016-01-14 | Discharge: 2016-01-14 | Disposition: A | Discharge status: Home / Self Care | Payer: Medicaid Other | Source: Ambulatory Visit | Attending: Otolaryngology | Admitting: Otolaryngology

## 2016-01-14 DIAGNOSIS — H9041 Sensorineural hearing loss, unilateral, right ear, with unrestricted hearing on the contralateral side: Secondary | ICD-10-CM

## 2016-01-14 DIAGNOSIS — IMO0001 Reserved for inherently not codable concepts without codable children: Secondary | ICD-10-CM

## 2016-01-14 MED ORDER — GADOBENATE DIMEGLUMINE 529 MG/ML IV SOLN
20.0000 mL | Freq: Once | INTRAVENOUS | Status: AC | PRN
  Administered 2016-01-14: 20 mL via INTRAVENOUS

## 2016-12-08 ENCOUNTER — Encounter (HOSPITAL_COMMUNITY): Payer: Self-pay | Admitting: Emergency Medicine

## 2016-12-08 ENCOUNTER — Emergency Department (HOSPITAL_COMMUNITY)
Admission: EM | Admit: 2016-12-08 | Discharge: 2016-12-08 | Disposition: A | Discharge status: Home / Self Care | Payer: Medicaid Other | Attending: Emergency Medicine | Admitting: Emergency Medicine

## 2016-12-08 ENCOUNTER — Emergency Department (HOSPITAL_COMMUNITY): Payer: Medicaid Other

## 2016-12-08 DIAGNOSIS — Z7982 Long term (current) use of aspirin: Secondary | ICD-10-CM | POA: Insufficient documentation

## 2016-12-08 DIAGNOSIS — E119 Type 2 diabetes mellitus without complications: Secondary | ICD-10-CM | POA: Insufficient documentation

## 2016-12-08 DIAGNOSIS — F17210 Nicotine dependence, cigarettes, uncomplicated: Secondary | ICD-10-CM | POA: Insufficient documentation

## 2016-12-08 DIAGNOSIS — Z7984 Long term (current) use of oral hypoglycemic drugs: Secondary | ICD-10-CM | POA: Insufficient documentation

## 2016-12-08 DIAGNOSIS — Z79899 Other long term (current) drug therapy: Secondary | ICD-10-CM | POA: Insufficient documentation

## 2016-12-08 DIAGNOSIS — I10 Essential (primary) hypertension: Secondary | ICD-10-CM | POA: Insufficient documentation

## 2016-12-08 DIAGNOSIS — R0789 Other chest pain: Secondary | ICD-10-CM | POA: Insufficient documentation

## 2016-12-08 LAB — D-DIMER, QUANTITATIVE (NOT AT ARMC): D-Dimer, Quant: 0.32 ug/mL-FEU (ref 0.00–0.50)

## 2016-12-08 LAB — BASIC METABOLIC PANEL
Anion gap: 9 (ref 5–15)
BUN: 27 mg/dL — ABNORMAL HIGH (ref 6–20)
CO2: 25 mmol/L (ref 22–32)
Calcium: 9.5 mg/dL (ref 8.9–10.3)
Chloride: 99 mmol/L — ABNORMAL LOW (ref 101–111)
Creatinine, Ser: 1.36 mg/dL — ABNORMAL HIGH (ref 0.44–1.00)
GFR calc Af Amer: 53 mL/min — ABNORMAL LOW (ref >60)
GFR calc non Af Amer: 46 mL/min — ABNORMAL LOW (ref >60)
Glucose, Bld: 463 mg/dL — ABNORMAL HIGH (ref 65–99)
Potassium: 4.5 mmol/L (ref 3.5–5.1)
Sodium: 133 mmol/L — ABNORMAL LOW (ref 135–145)

## 2016-12-08 LAB — CBC
HCT: 41.7 % (ref 36.0–46.0)
Hemoglobin: 14.9 g/dL (ref 12.0–15.0)
MCH: 29.6 pg (ref 26.0–34.0)
MCHC: 35.7 g/dL (ref 30.0–36.0)
MCV: 82.7 fL (ref 78.0–100.0)
Platelets: 222 10*3/uL (ref 150–400)
RBC: 5.04 MIL/uL (ref 3.87–5.11)
RDW: 12.1 % (ref 11.5–15.5)
WBC: 9.2 10*3/uL (ref 4.0–10.5)

## 2016-12-08 LAB — I-STAT TROPONIN, ED: Troponin i, poc: 0 ng/mL (ref 0.00–0.08)

## 2016-12-08 NOTE — ED Triage Notes (Signed)
Patient states that chest pain started 4 days ago. Patient radiating to left arm and face started today.  Patient states that pain has been pretty constant over the past 4 days.  Will have intermittent times of intensity.

## 2016-12-08 NOTE — ED Notes (Signed)
Patient was alert, oriented and stable upon discharge. RN went over AVS and patient had no further questions.  

## 2016-12-08 NOTE — ED Provider Notes (Signed)
[facility] Provider Note   CSN: [id number] Arrival date & time: 12/08/16  1333     History   Chief Complaint Chief Complaint  Patient presents with  . Chest Pain  . Arm Pain    HPI [name] is a 46 y.o. female.  Patient states 4 days ago she developed pain in the left side of her chest that was sharp in nature but did not seem worse with any particular movement, deep breathing or eating. The pain has been constant but comes in waves and when it severe. Radiated into her left neck and arm. It seems to be worse when she moves her arm. She denies any shortness of breath, palpitations, URI symptoms, dizziness or syncope.   The history is provided by the patient.  Chest Pain   This is a new problem. Episode onset: 4 days. The problem occurs constantly. The problem has been gradually worsening. Associated with: started spontaneously. The pain is present in the lateral region. The pain is at a severity of 6/10. The pain is moderate. Quality: constant sharp pain that radiates to the left neck and arm. The pain radiates to the left neck, left shoulder and left arm. Duration of episode(s) is 4 days. The symptoms are aggravated by certain positions. Pertinent negatives include no abdominal pain, no back pain, no cough, no diaphoresis, no dizziness, no fever, no irregular heartbeat, no leg pain, no lower extremity edema, no nausea, no palpitations, no shortness of breath, no sputum production, no syncope and no vomiting. Treatments tried: Ibuprofen. The treatment provided moderate relief. Risk factors include smoking/tobacco exposure, sedentary lifestyle and obesity.  Her past medical history is significant for diabetes and hypertension.  Pertinent negatives for past medical history include no MI and no PE.  Her family medical history is significant for CAD and PE. Family history comments: Sister had a heart attack in her 57s and father had a heart attack in his 35s  Arm Pain    Associated symptoms include chest pain. Pertinent negatives include no abdominal pain and no shortness of breath.    Past Medical History:  Diagnosis Date  . Arthritis    fingers  . Depression   . Diabetes mellitus   . Dyspnea 11/22/2013  . Hypertension   . Kidney stones   . PONV (postoperative nausea and vomiting)   . Sleep apnea    lost 70lbs no cpap x69yrs now    Patient Active Problem List   Diagnosis Date Noted  . OSA (obstructive sleep apnea) 11/22/2013  . Dyspnea 11/22/2013  . DM2 (diabetes mellitus, type 2) (HCC) 07/21/2012  . HTN (hypertension) 07/21/2012  . Smoker 07/21/2012    Past Surgical History:  Procedure Laterality Date  . CARDIAC CATHETERIZATION     4-54yrs ago  . CHOLECYSTECTOMY    . ENDOMETRIAL ABLATION     6 years ago  . LAPAROSCOPIC APPENDECTOMY  10/04/2011   Procedure: APPENDECTOMY LAPAROSCOPIC;  Surgeon: Rulon Abide, DO;  Location: [facility];  Service: General;  Laterality: N/A;    OB History    No data available       Home Medications    Prior to Admission medications   Medication Sig Start Date End Date Taking? Authorizing Provider  Ascorbic Acid (VITAMIN C GUMMIES) 125 MG CHEW Chew 125 mg by mouth daily.   Yes Historical Provider, MD  aspirin EC 325 MG tablet Take 325 mg by mouth daily.   Yes Historical Provider, MD  Biotin 00174 MCG  TABS Take 10,000 mcg by mouth daily.   Yes Historical Provider, MD  esomeprazole (NEXIUM) 40 MG capsule Take 40 mg by mouth daily at 12 noon.   Yes Historical Provider, MD  FLUoxetine (PROZAC) 40 MG capsule Take 40 mg by mouth daily.   Yes Historical Provider, MD  gabapentin (NEURONTIN) 300 MG capsule Take 300 mg by mouth every evening.   Yes Historical Provider, MD  lisinopril (PRINIVIL,ZESTRIL) 20 MG tablet Take 1 tablet (20 mg total) by mouth daily. 11/07/12  Yes Christina P Rama, MD  metFORMIN (GLUCOPHAGE-XR) 500 MG 24 hr tablet Take 500 mg by mouth 2 (two) times daily with a meal.   Yes Historical  Provider, MD    Family History Family History  Problem Relation Age of Onset  . Aneurysm Mother   . Heart attack Father   . Stroke Father   . Breast cancer Paternal Grandmother     Social History Social History  Substance Use Topics  . Smoking status: Current Every Day Smoker    Packs/day: 1.00    Years: 24.00    Types: Cigarettes  . Smokeless tobacco: Never Used  . Alcohol use Yes     Comment: seldom     Allergies   Codeine and Sulfa antibiotics   Review of Systems Review of Systems  Constitutional: Negative for diaphoresis and fever.  Respiratory: Negative for cough, sputum production and shortness of breath.   Cardiovascular: Positive for chest pain. Negative for palpitations and syncope.  Gastrointestinal: Negative for abdominal pain, nausea and vomiting.  Musculoskeletal: Negative for back pain.  Neurological: Negative for dizziness.  All other systems reviewed and are negative.    Physical Exam Updated Vital Signs BP 109/71   Pulse 81   Temp 98.1 F (36.7 C) (Oral)   Resp (!) 21   SpO2 94%   Physical Exam  Constitutional: She is oriented to person, place, and time. She appears well-developed and well-nourished. No distress.  HENT:  Head: Normocephalic and atraumatic.  Mouth/Throat: Oropharynx is clear and moist.  Eyes: Conjunctivae and EOM are normal. Pupils are equal, round, and reactive to light.  Neck: Normal range of motion. Neck supple.  Cardiovascular: Normal rate, regular rhythm and intact distal pulses.   No murmur heard. Pulmonary/Chest: Effort normal and breath sounds normal. No respiratory distress. She has no wheezes. She has no rales. She exhibits tenderness.  Mild tenderness to the left side of chest  Abdominal: Soft. She exhibits no distension. There is no tenderness. There is no rebound and no guarding.  Musculoskeletal: Normal range of motion. She exhibits tenderness. She exhibits no edema.  Tenderness with palpation over the  posterior shoulder and trapezius.  Tenderness with movement of the left shoulder.  No redness or swelling of the joint.  Neurological: She is alert and oriented to person, place, and time.  Skin: Skin is warm and dry. No rash noted. No erythema.  No dermatomal rash is present  Psychiatric: She has a normal mood and affect. Her behavior is normal.  Nursing note and vitals reviewed.    ED Treatments / Results  Labs (all labs ordered are listed, but only abnormal results are displayed) Labs Reviewed  BASIC METABOLIC PANEL - Abnormal; Notable for the following:       Result Value   Sodium 133 (*)    Chloride 99 (*)    Glucose, Bld 463 (*)    BUN 27 (*)    Creatinine, Ser 1.36 (*)    GFR  calc non Af Amer 46 (*)    GFR calc Af Amer 53 (*)    All other components within normal limits  CBC  D-DIMER, QUANTITATIVE (NOT AT [facility])  I-STAT TROPOININ, ED    EKG  EKG Interpretation  Date/Time:  Tuesday December 08 2016 13:41:23 EDT Ventricular Rate:  97 PR Interval:    QRS Duration: 89 QT Interval:  331 QTC Calculation: 421 R Axis:   84 Text Interpretation:  Sinus rhythm Probable left atrial enlargement since last tracing no significant change Confirmed by Effie Shy  MD, ELLIOTT [id number]) on 12/08/2016 3:22:40 PM       Radiology Dg Chest 2 View  Result Date: 12/08/2016 CLINICAL DATA:  Left-sided chest pain radiating to the left arm for 4 days, smoking history, diabetes EXAM: CHEST  2 VIEW COMPARISON:  Chest x-ray of 11/19/2014 FINDINGS: No active infiltrate or effusion is seen. Mediastinal and hilar contours are unremarkable. The heart is within normal limits in size. No bony abnormality is seen. IMPRESSION: No active cardiopulmonary disease. Electronically Signed   By: Dwyane Dee M.D.   On: 12/08/2016 14:23    Procedures Procedures (including critical care time)  Medications Ordered in ED Medications - No data to display   Initial Impression / Assessment and Plan / ED Course  I have  reviewed the triage vital signs and the nursing notes.  Pertinent labs & imaging results that were available during my care of the patient were reviewed by me and considered in my medical decision making (see chart for details).     Pt with atypical story for CP.  Moderate risk factors but no heart history herself but family hx.  Low risk well's but sister with PE/DVT.  EKG.  CXR, CBC, BMP, CE all within normal limits except for hyperglycemia. However patient is currently taking metformin and working on getting a new doctor and states 400 is not unusual for her. She has no URI symptoms or signs of pneumonia. Low suspicion for dissection, PE or ACS. Her symptoms are not exertional. Concern and may be musculoskeletal. D-dimer is within normal limits. Counseled the patient on results and return precautions. Encouraged follow-up in smoke cessation..   Final Clinical Impressions(s) / ED Diagnoses   Final diagnoses:  Atypical chest pain    New Prescriptions New Prescriptions   No medications on file     Gwyneth Sprout, MD 12/08/16 [phone]

## 2017-03-14 ENCOUNTER — Inpatient Hospital Stay (HOSPITAL_COMMUNITY)
Admission: EM | Admit: 2017-03-14 | Discharge: 2017-03-21 | DRG: 579 | Disposition: A | Discharge status: Home / Self Care | Payer: Self-pay | Attending: Internal Medicine | Admitting: Internal Medicine

## 2017-03-14 ENCOUNTER — Encounter (HOSPITAL_COMMUNITY): Payer: Self-pay

## 2017-03-14 DIAGNOSIS — R1031 Right lower quadrant pain: Secondary | ICD-10-CM | POA: Diagnosis not present

## 2017-03-14 DIAGNOSIS — L02415 Cutaneous abscess of right lower limb: Secondary | ICD-10-CM | POA: Diagnosis present

## 2017-03-14 DIAGNOSIS — R739 Hyperglycemia, unspecified: Secondary | ICD-10-CM

## 2017-03-14 DIAGNOSIS — Z9189 Other specified personal risk factors, not elsewhere classified: Secondary | ICD-10-CM

## 2017-03-14 DIAGNOSIS — Z7982 Long term (current) use of aspirin: Secondary | ICD-10-CM

## 2017-03-14 DIAGNOSIS — G4733 Obstructive sleep apnea (adult) (pediatric): Secondary | ICD-10-CM | POA: Diagnosis present

## 2017-03-14 DIAGNOSIS — K219 Gastro-esophageal reflux disease without esophagitis: Secondary | ICD-10-CM | POA: Diagnosis present

## 2017-03-14 DIAGNOSIS — I10 Essential (primary) hypertension: Secondary | ICD-10-CM | POA: Diagnosis present

## 2017-03-14 DIAGNOSIS — T383X6A Underdosing of insulin and oral hypoglycemic [antidiabetic] drugs, initial encounter: Secondary | ICD-10-CM | POA: Diagnosis present

## 2017-03-14 DIAGNOSIS — Z9049 Acquired absence of other specified parts of digestive tract: Secondary | ICD-10-CM

## 2017-03-14 DIAGNOSIS — Z91120 Patient's intentional underdosing of medication regimen due to financial hardship: Secondary | ICD-10-CM

## 2017-03-14 DIAGNOSIS — Z885 Allergy status to narcotic agent status: Secondary | ICD-10-CM

## 2017-03-14 DIAGNOSIS — N179 Acute kidney failure, unspecified: Secondary | ICD-10-CM | POA: Diagnosis present

## 2017-03-14 DIAGNOSIS — F329 Major depressive disorder, single episode, unspecified: Secondary | ICD-10-CM | POA: Diagnosis present

## 2017-03-14 DIAGNOSIS — Z79899 Other long term (current) drug therapy: Secondary | ICD-10-CM

## 2017-03-14 DIAGNOSIS — Z794 Long term (current) use of insulin: Secondary | ICD-10-CM

## 2017-03-14 DIAGNOSIS — L0390 Cellulitis, unspecified: Secondary | ICD-10-CM | POA: Diagnosis present

## 2017-03-14 DIAGNOSIS — R000 Tachycardia, unspecified: Secondary | ICD-10-CM | POA: Diagnosis present

## 2017-03-14 DIAGNOSIS — E1100 Type 2 diabetes mellitus with hyperosmolarity without nonketotic hyperglycemic-hyperosmolar coma (NKHHC): Secondary | ICD-10-CM

## 2017-03-14 DIAGNOSIS — E1140 Type 2 diabetes mellitus with diabetic neuropathy, unspecified: Secondary | ICD-10-CM | POA: Diagnosis present

## 2017-03-14 DIAGNOSIS — M069 Rheumatoid arthritis, unspecified: Secondary | ICD-10-CM | POA: Diagnosis present

## 2017-03-14 DIAGNOSIS — E871 Hypo-osmolality and hyponatremia: Secondary | ICD-10-CM | POA: Diagnosis present

## 2017-03-14 DIAGNOSIS — L03115 Cellulitis of right lower limb: Principal | ICD-10-CM

## 2017-03-14 DIAGNOSIS — M726 Necrotizing fasciitis: Secondary | ICD-10-CM

## 2017-03-14 DIAGNOSIS — F17210 Nicotine dependence, cigarettes, uncomplicated: Secondary | ICD-10-CM | POA: Diagnosis present

## 2017-03-14 DIAGNOSIS — Z882 Allergy status to sulfonamides status: Secondary | ICD-10-CM

## 2017-03-14 DIAGNOSIS — E861 Hypovolemia: Secondary | ICD-10-CM | POA: Diagnosis present

## 2017-03-14 LAB — GLUCOSE, CAPILLARY
Glucose-Capillary: 417 mg/dL — ABNORMAL HIGH (ref 65–99)
Glucose-Capillary: 328 mg/dL — ABNORMAL HIGH (ref 65–99)
Glucose-Capillary: 244 mg/dL — ABNORMAL HIGH (ref 65–99)

## 2017-03-14 LAB — I-STAT CHEM 8, ED
BUN: 29 mg/dL — ABNORMAL HIGH (ref 6–20)
Calcium, Ion: 1.04 mmol/L — ABNORMAL LOW (ref 1.15–1.40)
Chloride: 94 mmol/L — ABNORMAL LOW (ref 101–111)
Creatinine, Ser: 1 mg/dL (ref 0.44–1.00)
Glucose, Bld: 548 mg/dL (ref 65–99)
HCT: 43 % (ref 36.0–46.0)
Hemoglobin: 14.6 g/dL (ref 12.0–15.0)
Potassium: 4.3 mmol/L (ref 3.5–5.1)
Sodium: 128 mmol/L — ABNORMAL LOW (ref 135–145)
TCO2: 26 mmol/L (ref 0–100)

## 2017-03-14 LAB — CBC WITH DIFFERENTIAL/PLATELET
Basophils Absolute: 0 10*3/uL (ref 0.0–0.1)
Basophils Relative: 0 %
Eosinophils Absolute: 0 10*3/uL (ref 0.0–0.7)
Eosinophils Relative: 0 %
HCT: 40.1 % (ref 36.0–46.0)
Hemoglobin: 13.8 g/dL (ref 12.0–15.0)
Lymphocytes Relative: 20 %
Lymphs Abs: 2.8 10*3/uL (ref 0.7–4.0)
MCH: 29.6 pg (ref 26.0–34.0)
MCHC: 34.4 g/dL (ref 30.0–36.0)
MCV: 85.9 fL (ref 78.0–100.0)
Monocytes Absolute: 0.7 10*3/uL (ref 0.1–1.0)
Monocytes Relative: 5 %
Neutro Abs: 10.5 10*3/uL — ABNORMAL HIGH (ref 1.7–7.7)
Neutrophils Relative %: 75 %
Platelets: 164 10*3/uL (ref 150–400)
RBC: 4.67 MIL/uL (ref 3.87–5.11)
RDW: 12.4 % (ref 11.5–15.5)
WBC: 14 10*3/uL — ABNORMAL HIGH (ref 4.0–10.5)

## 2017-03-14 LAB — MRSA PCR SCREENING: MRSA by PCR: NEGATIVE

## 2017-03-14 LAB — I-STAT CG4 LACTIC ACID, ED: Lactic Acid, Venous: 1.09 mmol/L (ref 0.5–1.9)

## 2017-03-14 LAB — CBG MONITORING, ED: Glucose-Capillary: 525 mg/dL (ref 65–99)

## 2017-03-14 MED ORDER — SODIUM CHLORIDE 0.9 % IV SOLN
INTRAVENOUS | Status: DC
  Administered 2017-03-14: 3.4 [IU]/h via INTRAVENOUS
  Filled 2017-03-14: qty 1

## 2017-03-14 MED ORDER — DEXTROSE-NACL 5-0.45 % IV SOLN
INTRAVENOUS | Status: DC
  Administered 2017-03-14: 75 mL via INTRAVENOUS

## 2017-03-14 MED ORDER — SODIUM CHLORIDE 0.9 % IV SOLN
INTRAVENOUS | Status: AC
  Administered 2017-03-14: 1000 mL via INTRAVENOUS

## 2017-03-14 MED ORDER — VANCOMYCIN HCL IN DEXTROSE 1-5 GM/200ML-% IV SOLN
1000.0000 mg | Freq: Two times a day (BID) | INTRAVENOUS | Status: DC
  Administered 2017-03-15 – 2017-03-18 (×8): 1000 mg via INTRAVENOUS
  Filled 2017-03-14 (×8): qty 200

## 2017-03-14 MED ORDER — ACETAMINOPHEN 325 MG PO TABS
650.0000 mg | ORAL_TABLET | Freq: Four times a day (QID) | ORAL | Status: DC | PRN
  Administered 2017-03-15: 325 mg via ORAL
  Filled 2017-03-14: qty 2

## 2017-03-14 MED ORDER — POTASSIUM CHLORIDE 10 MEQ/100ML IV SOLN
10.0000 meq | INTRAVENOUS | Status: AC
  Administered 2017-03-14 (×2): 10 meq via INTRAVENOUS
  Filled 2017-03-14 (×2): qty 100

## 2017-03-14 MED ORDER — ONDANSETRON HCL 4 MG/2ML IJ SOLN: 4.0000 mg | Freq: Four times a day (QID) | INTRAMUSCULAR | Status: DC | PRN

## 2017-03-14 MED ORDER — HYDROCODONE-ACETAMINOPHEN 5-325 MG PO TABS
1.0000 | ORAL_TABLET | ORAL | Status: DC | PRN
  Administered 2017-03-14 – 2017-03-15 (×3): 1 via ORAL
  Administered 2017-03-15 (×3): 2 via ORAL
  Administered 2017-03-16 (×2): 1 via ORAL
  Administered 2017-03-16: 2 via ORAL
  Administered 2017-03-16: 1 via ORAL
  Administered 2017-03-17: 2 via ORAL
  Administered 2017-03-17: 1 via ORAL
  Administered 2017-03-17 – 2017-03-18 (×3): 2 via ORAL
  Filled 2017-03-14: qty 2
  Filled 2017-03-14 (×4): qty 1
  Filled 2017-03-14 (×4): qty 2
  Filled 2017-03-14 (×2): qty 1
  Filled 2017-03-14 (×2): qty 2
  Filled 2017-03-14: qty 1
  Filled 2017-03-14 (×2): qty 2

## 2017-03-14 MED ORDER — VANCOMYCIN HCL IN DEXTROSE 1-5 GM/200ML-% IV SOLN
1000.0000 mg | Freq: Once | INTRAVENOUS | Status: AC
  Administered 2017-03-14: 1000 mg via INTRAVENOUS
  Filled 2017-03-14: qty 200

## 2017-03-14 MED ORDER — CEFAZOLIN SODIUM-DEXTROSE 1-4 GM/50ML-% IV SOLN
1.0000 g | Freq: Once | INTRAVENOUS | Status: AC
  Administered 2017-03-14: 1 g via INTRAVENOUS
  Filled 2017-03-14: qty 50

## 2017-03-14 MED ORDER — ENOXAPARIN SODIUM 40 MG/0.4ML ~~LOC~~ SOLN
40.0000 mg | SUBCUTANEOUS | Status: DC
  Administered 2017-03-14 – 2017-03-20 (×7): 40 mg via SUBCUTANEOUS
  Filled 2017-03-14 (×7): qty 0.4

## 2017-03-14 MED ORDER — SODIUM CHLORIDE 0.9 % IV SOLN
INTRAVENOUS | Status: DC
  Administered 2017-03-14: 100 mL via INTRAVENOUS

## 2017-03-14 NOTE — ED Triage Notes (Signed)
Pt c/o R groin abscess. She had it lanced and drained on Friday and she states that the pain is worse and the abscess is bigger. No drainage in triage, but redness and heat noted from R inner groin to right above R knee. Pt reports compliance with the clindamycin prescribed. A&Ox4. Ambulatory with pain.

## 2017-03-14 NOTE — H&P (Signed)
History and Physical    [name] EAV:[id number] DOB: 14-Dec-1970 DOA: 03/14/2017  PCP: Patient, No Pcp Per   Patient coming from: Home  Chief Complaint: Right groin pain, swelling, redness  HPI: [name] is a 46 y.o. female with medical history significant for insulin-dependent diabetes mellitus, not taking insulin due to prohibitive cost, now presenting to the emergency department for evaluation of severe pain, swelling, and redness in the right groin. Patient reports that she had been in her usual state until approximately 03/11/2017 when she noted a painful lump near the right groin. Over the next day, this increased in size, significant surrounding erythema developed, and pain worsened. She was evaluated for this on 03/12/2017 in an emergency department in [place] where she underwent I&D with drainage of purulent material, and was started on clindamycin. She reports that over the ensuing 2 days, pain and swelling has re-worsened and erythema has extended down the medial right thigh. She denies any fevers or chills, denies cough or dyspnea, and denies dysuria or flank pain. No lightheadedness. She reports adherence with the clindamycin.  ED Course: Upon arrival to the ED, patient is found to be afebrile, saturating well on room air, tachycardic in the 110s, and with vitals otherwise stable. Chemistry panels notable for sodium of 128, BUN of 29, and serum glucose of 548. CBC features a leukocytosis to 14,000. He was treated with vancomycin in the emergency department and the hospitalists were asked to admit. Patient remained tachycardic in the ED with stable blood pressure and no significant respiratory distress. She will be fluid resuscitated, started on insulin, continued on antibiotics, and admitted to the stepdown unit for ongoing evaluation and management of cellulitis that has failed outpatient treatment and uncontrolled diabetes mellitus with suspected hyperosmolar  hyperglycemic state.  Review of Systems:  All other systems reviewed and apart from HPI, are negative.  Past Medical History:  Diagnosis Date  . Arthritis    fingers  . Depression   . Diabetes mellitus   . Dyspnea 11/22/2013  . Hypertension   . Kidney stones   . PONV (postoperative nausea and vomiting)   . Sleep apnea    lost 70lbs no cpap x17yrs now    Past Surgical History:  Procedure Laterality Date  . CARDIAC CATHETERIZATION     4-4yrs ago  . CHOLECYSTECTOMY    . ENDOMETRIAL ABLATION     6 years ago  . LAPAROSCOPIC APPENDECTOMY  10/04/2011   Procedure: APPENDECTOMY LAPAROSCOPIC;  Surgeon: Rulon Abide, DO;  Location: [facility];  Service: General;  Laterality: N/A;     reports that she has been smoking Cigarettes.  She has a 24.00 pack-year smoking history. She has never used smokeless tobacco. She reports that she drinks alcohol. She reports that she does not use drugs.  Allergies  Allergen Reactions  . Codeine Itching  . Sulfa Antibiotics Hives    Family History  Problem Relation Age of Onset  . Aneurysm Mother   . Heart attack Father   . Stroke Father   . Breast cancer Paternal Grandmother      Prior to Admission medications   Medication Sig Start Date End Date Taking? Authorizing Provider  aspirin EC 325 MG tablet Take 325 mg by mouth daily.   Yes [provider]  clindamycin (CLEOCIN) 300 MG capsule take 1 capsule by mouth every 6 hours for 10 days 03/13/17  Yes [provider]  esomeprazole (NEXIUM) 40 MG capsule Take 40 mg by  mouth daily at 12 noon.   Yes [provider]  FLUoxetine (PROZAC) 40 MG capsule Take 40 mg by mouth daily.   Yes [provider]  gabapentin (NEURONTIN) 300 MG capsule Take 300 mg by mouth daily as needed (pain).    Yes [provider]  lisinopril (PRINIVIL,ZESTRIL) 20 MG tablet Take 1 tablet (20 mg total) by mouth daily. 11/07/12  Yes Rama, Maryruth Bun, MD  meloxicam (MOBIC) 7.5 MG  tablet TAKE 1 TABLET (7.5 MG) BY ORAL ROUTE ONCE DAILY FOR 30 DAYS 02/15/17  Yes [provider]  metFORMIN (GLUCOPHAGE-XR) 500 MG 24 hr tablet Take 500 mg by mouth 2 (two) times daily with a meal.   Yes [provider]    Physical Exam: Vitals:   03/14/17 1627 03/14/17 1847 03/14/17 1852  BP: (!) 181/99 107/74   Pulse: (!) 118 93   Resp: 18 17   Temp: 98.4 F (36.9 C)    TempSrc: Oral    SpO2: 95% 95%   Weight:   88.9 kg (196 lb)  Height:   5\' 9"  (1.753 m)      Constitutional: NAD, calm, appears uncomfortable Eyes: PERTLA, lids and conjunctivae normal ENMT: Mucous membranes are dry. Posterior pharynx clear of any exudate or lesions.   Neck: normal, supple, no masses, no thyromegaly Respiratory: clear to auscultation bilaterally, no wheezing, no crackles. Tachypnea. Normal respiratory effort.   Cardiovascular: Rate ~110 and regular. No extremity edema. No significant JVD. Abdomen: No distension, no tenderness, no masses palpated. Bowel sounds active.  Musculoskeletal: no clubbing / cyanosis. No joint deformity upper and lower extremities.   Skin: Medial right thigh with erythema, edema, tenderness, and induration. No fluctuance appreciated, no drainage. Skin is otherwise warm, dry, well-perfused. Neurologic: CN 2-12 grossly intact. Sensation intact, DTR normal. Strength 5/5 in all 4 limbs.  Psychiatric: Alert and oriented x 3. Pleasant and cooperative.     Labs on Admission: I have personally reviewed following labs and imaging studies  CBC:  Recent Labs Lab 03/14/17 1724 03/14/17 1736  WBC 14.0*  --   NEUTROABS 10.5*  --   HGB 13.8 14.6  HCT 40.1 43.0  MCV 85.9  --   PLT 164  --    Basic Metabolic Panel:  Recent Labs Lab 03/14/17 1736  NA 128*  K 4.3  CL 94*  GLUCOSE 548*  BUN 29*  CREATININE 1.00   GFR: Estimated Creatinine Clearance: 81.8 mL/min (by C-G formula based on SCr of 1 mg/dL). Liver Function Tests: No results for input(s):  AST, ALT, ALKPHOS, BILITOT, PROT, ALBUMIN in the last 168 hours. No results for input(s): LIPASE, AMYLASE in the last 168 hours. No results for input(s): AMMONIA in the last 168 hours. Coagulation Profile: No results for input(s): INR, PROTIME in the last 168 hours. Cardiac Enzymes: No results for input(s): CKTOTAL, CKMB, CKMBINDEX, TROPONINI in the last 168 hours. BNP (last 3 results) No results for input(s): PROBNP in the last 8760 hours. HbA1C: No results for input(s): HGBA1C in the last 72 hours. CBG:  Recent Labs Lab 03/14/17 1811  GLUCAP 525*   Lipid Profile: No results for input(s): CHOL, HDL, LDLCALC, TRIG, CHOLHDL, LDLDIRECT in the last 72 hours. Thyroid Function Tests: No results for input(s): TSH, T4TOTAL, FREET4, T3FREE, THYROIDAB in the last 72 hours. Anemia Panel: No results for input(s): VITAMINB12, FOLATE, FERRITIN, TIBC, IRON, RETICCTPCT in the last 72 hours. Urine analysis:    Component Value Date/Time   COLORURINE YELLOW 12/12/2013 1608  APPEARANCEUR CLEAR 12/12/2013 1608   LABSPEC 1.018 12/12/2013 1608   PHURINE 5.0 12/12/2013 1608   GLUCOSEU >1000 (A) 12/12/2013 1608   HGBUR NEGATIVE 12/12/2013 1608   BILIRUBINUR NEGATIVE 12/12/2013 1608   KETONESUR NEGATIVE 12/12/2013 1608   PROTEINUR NEGATIVE 12/12/2013 1608   UROBILINOGEN 0.2 12/12/2013 1608   NITRITE NEGATIVE 12/12/2013 1608   LEUKOCYTESUR NEGATIVE 12/12/2013 1608   Sepsis Labs: @LABRCNTIP (procalcitonin:4,lacticidven:4) )No results found for this or any previous visit (from the past 240 hour(s)).   Radiological Exams on Admission: No results found.  EKG: Not performed.   Assessment/Plan  1. Cellulitis  - Pt presents with increasing pain and redness at right medial thigh despite having an abscess there I&D'd on 03/12/17 and starting clindamycin the same day  - There is a leukocytosis to 14k and tachycardia (possibly secondary to #2), but she has been afebrile, lactate reassuring at 1.09  -  ED physician reports assessing with ultrasound and finding no fluid collections  - Plan to start empiric vancomycin, prn analgesia, glucose-control, consider formal imaging if worsens or fails to improve with current management    2. Hyperosmolar hyperglycemic state, IDDM   - Pt has IDDM, not able to afford her insulin so has gone without  - Appears to be HHS on admission - She will be fluid-resuscitated with NS and started insulin infusion with frequent CBG's and serial chem panels until resolves   3. Hyponatremia  - Serum sodium is 128 on admission in setting of marked hyperglycemia and hypovolemia - Plan to fluid-resuscitate with NS and control sugars with insulin infusion  - Will follow serial chem panels as above    DVT prophylaxis: sq Lovenox   Code Status: Full  Family Communication: Discussed with patient Disposition Plan: Admit to SDU  Consults called: None Admission status: Inpatient    Briscoe Deutscher, MD [facility] Pager [phone]  If 7PM-7AM, please contact night-coverage [email or web address] Password [facility]  03/14/2017, 7:28 PM

## 2017-03-14 NOTE — ED Provider Notes (Signed)
MSE was initiated and I personally evaluated the patient and placed orders (if any) at  4:56 PM on March 14, 2017.  By signing my name below, I, Soijett Blue, attest that this documentation has been prepared under the direction and in the presence of Leary Roca, [employer] Electronically Signed: Soijett Blue, ED Scribe. 03/14/17. 4:42 PM.  [name] is a 46 y.o. female with a past medical history significant for insulin dependent DM, HTN who presents to the Emergency Department complaining of progressively worsening abscess to right inner groin/thigh x 3 days. Patient states that she noted a "boil" on her right inner thigh Thursday while on her way to her 30 year high school reunion. Friday she was seen at [facility] ED in [place] for this, where she was diagnosed with an abscess, an I&D was preformed and she was put on clindamycin. She states at the time of I&D the redness was ~8cm. The I&D expelled purulent drainage. No packing was inserted. She had her first dose of Clindamycin late Friday night and has taken a dose every 6 hours since then. She states she has not missed a dose. Since being discharge the patient notes increase pain and spreading redness of the inner right thigh/groin. She notes when she presented on Friday to the hosptial her pain was a 8/10. It is a "47/10" today. The pain is worsened by ambulation and is associated with a burning sensation. She denies recent illness, hospitalization. She has had a perirectal abscess in the past but nothing similar. The patient states she has not being taking her insulin because she cannot afford it. She denies IVDU.    PE:  Gen: The patient is non-toxic appearing. CV: Tachycardic, regular rhythm. Peripheral pulses intact. No peripheral edema. Calf equal bilaterally. Lungs: CTAB Abd: Soft, Non tender, non distended Skin: Scribe chaperone present for exam. On the right inner thigh there is a 0.5cm incision with  induration around the incision measuring 7cm. There area of redness surrounding this measures 18 x 13 cm and is warm to the touch. No drainage noted from area where incision was made. Left leg, vagina and rectal vault spared.   Plan: The patient appears stable so that the remainder of the MSE may be completed by another provider. I will order lab tests to begin the patients screening process including CBC, Chem-8, lactate, and CBG.  I personally performed the services described in this documentation, which was scribed in my presence. The recorded information has been reviewed and is accurate.         Jacinto Halim, PA-C 03/14/17 1721    Nira Conn, MD 03/15/17 Marlyne Beards

## 2017-03-14 NOTE — Progress Notes (Signed)
Pharmacy Antibiotic Note  [name] is a 46 y.o. female admitted on 03/14/2017 with cellulitis.  Pharmacy has been consulted for vancomycin dosing.  PMH is  significant for insulin dependent DM, HTNwho presents to the Emergency Department complaining of progressively worsening abscess to right inner groin/thigh x 3 days. Friday she was seen at [facility] ED in [place] for this, where she was diagnosed with an abscess, an I&D was preformed and she was put on clindamycin. She states at the time of I&D the redness was ~8cm. The I&D expelled purulent drainage.She states she has not missed a dose. Since being discharge the patient notes increase pain and spreading redness of the inner right thigh/groin. Reports compliance to clindamycin  Plan: Vancomycin 1000  IV every 12 hours.  Goal trough 10-15 mcg/mL.  Monitor clinical course, renal function, cultures as available   Height: 5\' 9"  (175.3 cm) Weight: 196 lb (88.9 kg) IBW/kg (Calculated) : 66.2  Temp (24hrs), Avg:98.4 F (36.9 C), Min:98.4 F (36.9 C), Max:98.4 F (36.9 C)   Recent Labs Lab 03/14/17 1724 03/14/17 1736  WBC 14.0*  --   CREATININE  --  1.00  LATICACIDVEN  --  1.09    Estimated Creatinine Clearance: 81.8 mL/min (by C-G formula based on SCr of 1 mg/dL).    Allergies  Allergen Reactions  . Codeine Itching  . Sulfa Antibiotics Hives    Antimicrobials this admission: 7/15 vancomycin >>   PTA clindamycin  PO started on 7/13  Dose adjustments this admission: ---  Microbiology results:   Thank you for allowing pharmacy to be a part of this patient's care.   8/13, PharmD, BCPS Pager [phone] 03/14/2017 7:15 PM

## 2017-03-14 NOTE — ED Notes (Signed)
Hospitalist at bedside 

## 2017-03-15 DIAGNOSIS — L03115 Cellulitis of right lower limb: Secondary | ICD-10-CM

## 2017-03-15 DIAGNOSIS — R739 Hyperglycemia, unspecified: Secondary | ICD-10-CM

## 2017-03-15 DIAGNOSIS — I10 Essential (primary) hypertension: Secondary | ICD-10-CM

## 2017-03-15 LAB — BASIC METABOLIC PANEL
Anion gap: 5 (ref 5–15)
Anion gap: 7 (ref 5–15)
Anion gap: 8 (ref 5–15)
BUN: 19 mg/dL (ref 6–20)
BUN: 20 mg/dL (ref 6–20)
BUN: 20 mg/dL (ref 6–20)
CO2: 24 mmol/L (ref 22–32)
CO2: 26 mmol/L (ref 22–32)
CO2: 26 mmol/L (ref 22–32)
Calcium: 8.2 mg/dL — ABNORMAL LOW (ref 8.9–10.3)
Calcium: 8.3 mg/dL — ABNORMAL LOW (ref 8.9–10.3)
Calcium: 8.5 mg/dL — ABNORMAL LOW (ref 8.9–10.3)
Chloride: 101 mmol/L (ref 101–111)
Chloride: 102 mmol/L (ref 101–111)
Chloride: 102 mmol/L (ref 101–111)
Creatinine, Ser: 0.77 mg/dL (ref 0.44–1.00)
Creatinine, Ser: 0.79 mg/dL (ref 0.44–1.00)
Creatinine, Ser: 0.82 mg/dL (ref 0.44–1.00)
GFR calc Af Amer: >60 mL/min (ref >60)
GFR calc Af Amer: >60 mL/min (ref >60)
GFR calc Af Amer: >60 mL/min (ref >60)
GFR calc non Af Amer: >60 mL/min (ref >60)
GFR calc non Af Amer: >60 mL/min (ref >60)
GFR calc non Af Amer: >60 mL/min (ref >60)
Glucose, Bld: 135 mg/dL — ABNORMAL HIGH (ref 65–99)
Glucose, Bld: 197 mg/dL — ABNORMAL HIGH (ref 65–99)
Glucose, Bld: 200 mg/dL — ABNORMAL HIGH (ref 65–99)
Potassium: 3.5 mmol/L (ref 3.5–5.1)
Potassium: 3.8 mmol/L (ref 3.5–5.1)
Potassium: 4.5 mmol/L (ref 3.5–5.1)
Sodium: 133 mmol/L — ABNORMAL LOW (ref 135–145)
Sodium: 133 mmol/L — ABNORMAL LOW (ref 135–145)
Sodium: 135 mmol/L (ref 135–145)

## 2017-03-15 LAB — GLUCOSE, CAPILLARY
Glucose-Capillary: 110 mg/dL — ABNORMAL HIGH (ref 65–99)
Glucose-Capillary: 116 mg/dL — ABNORMAL HIGH (ref 65–99)
Glucose-Capillary: 126 mg/dL — ABNORMAL HIGH (ref 65–99)
Glucose-Capillary: 132 mg/dL — ABNORMAL HIGH (ref 65–99)
Glucose-Capillary: 155 mg/dL — ABNORMAL HIGH (ref 65–99)
Glucose-Capillary: 164 mg/dL — ABNORMAL HIGH (ref 65–99)
Glucose-Capillary: 167 mg/dL — ABNORMAL HIGH (ref 65–99)
Glucose-Capillary: 178 mg/dL — ABNORMAL HIGH (ref 65–99)
Glucose-Capillary: 189 mg/dL — ABNORMAL HIGH (ref 65–99)
Glucose-Capillary: 189 mg/dL — ABNORMAL HIGH (ref 65–99)
Glucose-Capillary: 214 mg/dL — ABNORMAL HIGH (ref 65–99)
Glucose-Capillary: 246 mg/dL — ABNORMAL HIGH (ref 65–99)

## 2017-03-15 MED ORDER — SODIUM CHLORIDE 0.9 % IV SOLN
INTRAVENOUS | Status: DC
  Administered 2017-03-15: 14:00:00 via INTRAVENOUS
  Administered 2017-03-16: 1000 mL via INTRAVENOUS
  Administered 2017-03-16: 22:00:00 via INTRAVENOUS

## 2017-03-15 MED ORDER — INSULIN ASPART 100 UNIT/ML ~~LOC~~ SOLN: 0.0000 [IU] | Freq: Every day | SUBCUTANEOUS | Status: DC

## 2017-03-15 MED ORDER — INSULIN GLARGINE 100 UNIT/ML ~~LOC~~ SOLN
20.0000 [IU] | Freq: Every day | SUBCUTANEOUS | Status: DC
  Administered 2017-03-15 – 2017-03-16 (×2): 20 [IU] via SUBCUTANEOUS
  Administered 2017-03-17: 10 [IU] via SUBCUTANEOUS
  Filled 2017-03-15 (×4): qty 0.2

## 2017-03-15 MED ORDER — PANTOPRAZOLE SODIUM 40 MG PO TBEC
40.0000 mg | DELAYED_RELEASE_TABLET | Freq: Every day | ORAL | Status: DC
  Administered 2017-03-15 – 2017-03-21 (×7): 40 mg via ORAL
  Filled 2017-03-15 (×7): qty 1

## 2017-03-15 MED ORDER — INSULIN ASPART 100 UNIT/ML ~~LOC~~ SOLN
0.0000 [IU] | Freq: Three times a day (TID) | SUBCUTANEOUS | Status: DC
  Administered 2017-03-15 (×2): 3 [IU] via SUBCUTANEOUS
  Administered 2017-03-16: 5 [IU] via SUBCUTANEOUS
  Administered 2017-03-16: 2 [IU] via SUBCUTANEOUS
  Administered 2017-03-16: 5 [IU] via SUBCUTANEOUS
  Administered 2017-03-17: 11 [IU] via SUBCUTANEOUS
  Administered 2017-03-17: 8 [IU] via SUBCUTANEOUS
  Administered 2017-03-18: 11 [IU] via SUBCUTANEOUS
  Administered 2017-03-18: 3 [IU] via SUBCUTANEOUS

## 2017-03-15 MED ORDER — SODIUM CHLORIDE 0.9 % IV BOLUS (SEPSIS)
500.0000 mL | Freq: Once | INTRAVENOUS | Status: AC
  Administered 2017-03-15: 500 mL via INTRAVENOUS

## 2017-03-15 MED ORDER — ASPIRIN EC 325 MG PO TBEC
325.0000 mg | DELAYED_RELEASE_TABLET | Freq: Every day | ORAL | Status: DC
  Administered 2017-03-15 – 2017-03-21 (×7): 325 mg via ORAL
  Filled 2017-03-15 (×7): qty 1

## 2017-03-15 MED ORDER — GABAPENTIN 300 MG PO CAPS
300.0000 mg | ORAL_CAPSULE | Freq: Every day | ORAL | Status: DC | PRN
  Administered 2017-03-15: 300 mg via ORAL
  Filled 2017-03-15: qty 1

## 2017-03-15 MED ORDER — SODIUM CHLORIDE 0.9 % IV SOLN
INTRAVENOUS | Status: DC
  Filled 2017-03-15: qty 1

## 2017-03-15 MED ORDER — FLUOXETINE HCL 20 MG PO CAPS
40.0000 mg | ORAL_CAPSULE | Freq: Every day | ORAL | Status: DC
  Administered 2017-03-15 – 2017-03-21 (×7): 40 mg via ORAL
  Filled 2017-03-15 (×7): qty 2

## 2017-03-15 MED ORDER — FLUOXETINE HCL 40 MG PO CAPS: 40.0000 mg | ORAL_CAPSULE | Freq: Every day | ORAL | Status: DC

## 2017-03-15 MED ORDER — INSULIN ASPART 100 UNIT/ML ~~LOC~~ SOLN
4.0000 [IU] | Freq: Three times a day (TID) | SUBCUTANEOUS | Status: DC
  Administered 2017-03-15: 4 [IU] via SUBCUTANEOUS
  Administered 2017-03-15: 10:00:00 via SUBCUTANEOUS
  Administered 2017-03-16 – 2017-03-18 (×6): 4 [IU] via SUBCUTANEOUS

## 2017-03-15 MED ORDER — LISINOPRIL 20 MG PO TABS
20.0000 mg | ORAL_TABLET | Freq: Every day | ORAL | Status: DC
  Administered 2017-03-15 – 2017-03-19 (×4): 20 mg via ORAL
  Filled 2017-03-15 (×5): qty 1

## 2017-03-15 NOTE — Progress Notes (Signed)
CSW consulted to assist with PCP / meds at dc. CSW is unable to assist with this request.   Cori Razor LCSW [phone]

## 2017-03-15 NOTE — ED Provider Notes (Signed)
[facility]-EMERGENCY DEPT Provider Note   CSN: [id number] Arrival date & time: 03/14/17  1622     History   Chief Complaint Chief Complaint  Patient presents with  . Abscess    HPI [name] is a 46 y.o. female.  Patient transferred to my care from [facility]. Per his note: "Meghan C Godwinis a 46 y.o.femalewith a past medical history significant for insulin dependent DM, HTNwho presents to the Emergency Department complaining of progressively worsening abscess to right inner groin/thigh x 3 days. Patient states that she noted a "boil" on her right inner thigh Thursday while on her way to her 30 year high school reunion. Friday she was seen at [facility] ED in [place] for this, where she was diagnosed with an abscess, an I&D was preformed and she was put on clindamycin. She states at the time of I&D the redness was ~8cm. The I&D expelled purulent drainage. No packing was inserted. She had her first dose of Clindamycin late Friday night and has taken a dose every 6 hours since then. She states she has not missed a dose. Since being discharge the patient notes increase pain and spreading redness of the inner right thigh/groin. She notes when she presented on Friday to the hosptial her pain was a 8/10. It is a "47/10" today. The pain is worsened by ambulation and is associated with a burning sensation. She denies recent illness, hospitalization. She has had a perirectal abscess in the past but nothing similar. The patient states she has not being taking her insulin because she cannot afford it. She denies IVDU."    Per my history, patient w/ a h/o of insulin-dependent DM on metformin. She has been compliant with metformin, but not been taking insulin because she is unable to afford the medication. She presents to the emergency department with worsening redness, pain, and swelling to the right thigh x4 days. She reports she noticed a painful boil near the right groin and  presented to the emergency department 3 days ago for an I&D. She was discharged with clindamycin, which she has taken every 6 hours as prescribed for the last 48 hours. She reports that over the last 2 days, the area became increasingly painful, and she now has redness extending down the right medial thigh. She denies fever or chills. She reports that she has been followed by [facility] surgery for previous infections. She reports that one resolved with an I&D, but the other required anesthesia in the OR for a perirectal infection. She reports both of these infections happen several years ago and she has had no problems since. She reports that she has checked her blood sugar at home, which usually runs in the 500-700 range. She denies abdominal pain, nausea, vomiting, headache, or other complaints at this time.    The history is provided by the patient. No language interpreter was used.    Past Medical History:  Diagnosis Date  . Arthritis    fingers  . Depression   . Diabetes mellitus   . Dyspnea 11/22/2013  . Hypertension   . Kidney stones   . PONV (postoperative nausea and vomiting)   . Sleep apnea    lost 70lbs no cpap x55yrs now    Patient Active Problem List   Diagnosis Date Noted  . Cellulitis 03/14/2017  . Hyponatremia 03/14/2017  . OSA (obstructive sleep apnea) 11/22/2013  . Dyspnea 11/22/2013  . Uncontrolled type 2 diabetes mellitus with hyperosmolar nonketotic hyperglycemia (HCC)  07/21/2012  . HTN (hypertension) 07/21/2012  . Smoker 07/21/2012    Past Surgical History:  Procedure Laterality Date  . CARDIAC CATHETERIZATION     4-96yrs ago  . CHOLECYSTECTOMY    . ENDOMETRIAL ABLATION     6 years ago  . LAPAROSCOPIC APPENDECTOMY  10/04/2011   Procedure: APPENDECTOMY LAPAROSCOPIC;  Surgeon: Rulon Abide, DO;  Location: [facility] ORS;  Service: General;  Laterality: N/A;    OB History    No data available       Home Medications    Prior to Admission medications    Medication Sig Start Date End Date Taking? Authorizing Provider  aspirin EC 325 MG tablet Take 325 mg by mouth daily.   Yes [provider]  clindamycin (CLEOCIN) 300 MG capsule take 1 capsule by mouth every 6 hours for 10 days 03/13/17  Yes [provider]  esomeprazole (NEXIUM) 40 MG capsule Take 40 mg by mouth daily at 12 noon.   Yes [provider]  FLUoxetine (PROZAC) 40 MG capsule Take 40 mg by mouth daily.   Yes [provider]  gabapentin (NEURONTIN) 300 MG capsule Take 300 mg by mouth daily as needed (pain).    Yes [provider]  lisinopril (PRINIVIL,ZESTRIL) 20 MG tablet Take 1 tablet (20 mg total) by mouth daily. 11/07/12  Yes Rama, Maryruth Bun, MD  meloxicam (MOBIC) 7.5 MG tablet TAKE 1 TABLET (7.5 MG) BY ORAL ROUTE ONCE DAILY FOR 30 DAYS 02/15/17  Yes [provider]  metFORMIN (GLUCOPHAGE-XR) 500 MG 24 hr tablet Take 500 mg by mouth 2 (two) times daily with a meal.   Yes [provider]    Family History Family History  Problem Relation Age of Onset  . Aneurysm Mother   . Heart attack Father   . Stroke Father   . Breast cancer Paternal Grandmother     Social History Social History  Substance Use Topics  . Smoking status: Current Every Day Smoker    Packs/day: 1.00    Years: 24.00    Types: Cigarettes  . Smokeless tobacco: Never Used  . Alcohol use Yes     Comment: seldom     Allergies   Codeine and Sulfa antibiotics   Review of Systems Review of Systems  Constitutional: Negative for chills and fever.  Gastrointestinal: Negative for abdominal pain, nausea and vomiting.  Musculoskeletal: Positive for myalgias. Negative for arthralgias.  Skin: Positive for color change and wound.  Neurological: Negative for headaches.     Physical Exam Updated Vital Signs BP (!) 90/58   Pulse 82   Temp 98.2 F (36.8 C) (Oral)   Resp (!) 21   Ht 5\' 9"  (1.753 m)   Wt 88.9 kg (196 lb)   SpO2 98%   BMI  28.94 kg/m   Physical Exam  Constitutional: No distress.  HENT:  Head: Normocephalic.  Eyes: Conjunctivae are normal.  Neck: Neck supple.  Cardiovascular: Regular rhythm, normal heart sounds and intact distal pulses.  Tachycardia present.  Exam reveals no gallop and no friction rub.   No murmur heard. Pulmonary/Chest: Effort normal and breath sounds normal. No respiratory distress. She has no wheezes. She has no rales.  Abdominal: Soft. She exhibits no distension. There is no tenderness. There is no rebound and no guarding.  Neurological: She is alert.  Skin: Skin is warm. No rash noted.  A 0.5 cm incision with surrounding induration ~7 cm is noted to the right medial thigh. Minimal fluctuance.  Warm to the touch. An area of 18x13 cm of erythema is also present. Exquisitely TTP. No GU or perianal involvement. No purulent drainage.   Psychiatric: Her behavior is normal.  Nursing note and vitals reviewed.  ED Treatments / Results  Labs (all labs ordered are listed, but only abnormal results are displayed) Labs Reviewed  CBC WITH DIFFERENTIAL/PLATELET - Abnormal; Notable for the following:       Result Value   WBC 14.0 (*)    Neutro Abs 10.5 (*)    All other components within normal limits  BASIC METABOLIC PANEL - Abnormal; Notable for the following:    Sodium 133 (*)    Glucose, Bld 200 (*)    Calcium 8.3 (*)    All other components within normal limits  GLUCOSE, CAPILLARY - Abnormal; Notable for the following:    Glucose-Capillary 417 (*)    All other components within normal limits  GLUCOSE, CAPILLARY - Abnormal; Notable for the following:    Glucose-Capillary 328 (*)    All other components within normal limits  GLUCOSE, CAPILLARY - Abnormal; Notable for the following:    Glucose-Capillary 244 (*)    All other components within normal limits  GLUCOSE, CAPILLARY - Abnormal; Notable for the following:    Glucose-Capillary 214 (*)    All other components within normal limits    GLUCOSE, CAPILLARY - Abnormal; Notable for the following:    Glucose-Capillary 116 (*)    All other components within normal limits  I-STAT CHEM 8, ED - Abnormal; Notable for the following:    Sodium 128 (*)    Chloride 94 (*)    BUN 29 (*)    Glucose, Bld 548 (*)    Calcium, Ion 1.04 (*)    All other components within normal limits  CBG MONITORING, ED - Abnormal; Notable for the following:    Glucose-Capillary 525 (*)    All other components within normal limits  MRSA PCR SCREENING  COMPREHENSIVE METABOLIC PANEL  HIV ANTIBODY (ROUTINE TESTING)  BASIC METABOLIC PANEL  BASIC METABOLIC PANEL  BASIC METABOLIC PANEL  I-STAT CG4 LACTIC ACID, ED    EKG  EKG Interpretation None       Radiology No results found.  Procedures Procedures (including critical care time)  EMERGENCY DEPARTMENT US SOFT TISSUE INTERPRETATION "Study: Limited Soft Tissue Ultrasound"  INDICATIONS: Soft tissue infection Multiple views of the body part were obtained in real-time with a multi-frequency linear probe  PERFORMED BY: Myself IMAGES ARCHIVED?: Yes SIDE:Right  BODY PART:thigh INTERPRETATION:  Cellulitis present     Medications Ordered in ED Medications  vancomycin (VANCOCIN) IVPB 1000 mg/200 mL premix (not administered)  0.9 %  sodium chloride infusion (1,000 mLs Intravenous New Bag/Given 03/14/17 2121)  0.9 %  sodium chloride infusion ( Intravenous Stopped 03/14/17 2253)  dextrose 5 %-0.45 % sodium chloride infusion (75 mLs Intravenous New Bag/Given 03/14/17 2253)  insulin regular (NOVOLIN R,HUMULIN R) 100 Units in sodium chloride 0.9 % 100 mL (1 Units/mL) infusion (3.4 Units/hr Intravenous New Bag/Given 03/14/17 1955)  enoxaparin (LOVENOX) injection 40 mg (40 mg Subcutaneous Given 03/14/17 2128)  acetaminophen (TYLENOL) tablet 650 mg (not administered)  HYDROcodone-acetaminophen (NORCO/VICODIN) 5-325 MG per tablet 1-2 tablet (2 tablets Oral Given 03/15/17 0102)  ondansetron (ZOFRAN)  injection 4 mg (not administered)  ceFAZolin (ANCEF) IVPB 1 g/50 mL premix (0 g Intravenous Stopped 03/14/17 2010)  vancomycin (VANCOCIN) IVPB 1000 mg/200 mL premix (0 mg Intravenous Stopped 03/14/17 2111)  potassium chloride 10 mEq in 100  mL IVPB (0 mEq Intravenous Stopped 03/14/17 2347)     Initial Impression / Assessment and Plan / ED Course  I have reviewed the triage vital signs and the nursing notes.  Pertinent labs & imaging results that were available during my care of the patient were reviewed by me and considered in my medical decision making (see chart for details).     Patient presenting with cellulitis of the right medial thigh and hyperglycemia who failed PO clindamycin therapy. NAD. VSS. Afebrile. Patient was initially tachycardic, which resolved spontaneously in the ED. Leukocytosis of 14.0. Bedside ultrasound demonstrating cobblestoning, but no fluid collection amenable to I&D. An warm, erythematous area of 18 x 13 cm was outlined with a skin marker. Patient declines pain medication at this time. Patient meets criteria for severe nonpurulent SSTI because she is immunocompromised and failed PO therapy; however, she has no signs of deeper infection, organ dysfunction, or hypotension. Will initiate IV cefazolin for MSSA and strep coverage at this time in place of Zosyn and vancomycin for MRSA coverage. Glucose is 548. Patient denies abdominal and neurological complaints at this time. Consulted the hospitalist, Dr. Antionette Char, for admission for severe cellulitis and hyperglycemia who will admit the patient for continued workup and treatment at this time.      Final Clinical Impressions(s) / ED Diagnoses   Final diagnoses:  Cellulitis of leg, right  Hyperglycemia    New Prescriptions Current Discharge Medication List       Alvy Bimler 03/15/17 0128    Lorre Nick, MD 03/15/17 [phone]

## 2017-03-15 NOTE — Care Management Note (Signed)
Case Management Note  Patient Details  Name: GRAYLEE ARUTYUNYAN MRN: [id number] Date of Birth: 01/19/1971  Subjective/Objective:                  hyperglycemia  Action/Plan: Date:  March 15, 2017 Chart reviewed for concurrent status and case management needs. Will continue to follow patient progress. Discharge Planning: following for needs Expected discharge date: [id number] Marcelle Smiling, BSN, [place], [place]   [phone] Expected Discharge Date:                  Expected Discharge Plan:  Home/Self Care  In-House Referral:     Discharge planning Services  CM Consult  Post Acute Care Choice:    Choice offered to:     DME Arranged:    DME Agency:     HH Arranged:    HH Agency:     Status of Service:  In process, will continue to follow  If discussed at Long Length of Stay Meetings, dates discussed:    Additional Comments:  Golda Acre, RN 03/15/2017, 8:49 AM

## 2017-03-15 NOTE — Progress Notes (Signed)
Transferred to 1340 via wheelchair .Report given to receiving RN .

## 2017-03-15 NOTE — Progress Notes (Signed)
Patient c/o pain right groin. Red area is circled with marker.Complains of pain after getting up to bedside commode. Oral pain med given. Urine is amber colored at this time.

## 2017-03-15 NOTE — Progress Notes (Signed)
[facility] PROGRESS NOTE  [name] FSF:[id number] DOB: [birth year] DOA: 03/14/2017 PCP: Patient, No Pcp Per Interim summary and HPI 46 y.o. female with medical history significant for insulin-dependent diabetes mellitus, not taking insulin due to prohibitive cost, now presenting to the emergency department for evaluation of severe pain, swelling, and redness in the right groin. Patient also with HONK.  Assessment/Plan: 1-right inner thigh cellulitis  -patient failed therapy with cleocin as an outpatient  -will continue vancomycin  -continue PRN analgesics and warm compresses TID -will follow clinical response -afebrile and WBC's WNL.  2-uncontrolled hyperglycemia: type 2 diabetes with neuropathy -with HONK -will transition of IV insulin and will use long acting and SSI continue IVF's -modified carb diet -CM contacted for assistance with meds at discharge -will resume neurontin -continue holding oral hypoglycemic regimen   3-hyponatremia -due to hyperglycemia -resolved after controlling sugars and providing IVF's  4-HTN: -will resume lisinopril  5-GERD -will continue PPI  6-Depression -will resume Prozac  Code Status: Full code Family Communication: no family at bedside  Disposition Plan:home when medically stable; continue IV antibiotics. Move out of stepdown and continue medication adjustment for elevated CBG's.  Consultants:  None   Procedures:  See below for x-ray reports   Antibiotics:  Vancomycin 7/15  HPI/Subjective: Afebrile, no CP, no SOB, no nausea, no vomiting. Continue to have pain redness and swelling on her right inner thigh (improving).  Objective: Vitals:   03/15/17 1200 03/15/17 1326  BP: 110/69   Pulse: 80 78  Resp: (!) 24 16  Temp: 98.4 F (36.9 C)     Intake/Output Summary (Last 24 hours) at 03/15/17 1343 Last data filed at 03/15/17 1300  Gross per 24 hour  Intake          3172.58 ml  Output              950 ml   Net          2222.58 ml   Filed Weights   03/14/17 1852  Weight: 88.9 kg (196 lb)    Exam:   General: afebrile, denies CP and SOB. denies nausea and vomiting. Complaining of right inner thigh tenderness and swelling.  Cardiovascular: S1 and S2, no rubs, no gallops  Respiratory: good air movement, no wheezing, no crackles  Abdomen: soft, NT, ND, positive BS  Musculoskeletal: right inner thigh with inner side swollen, erythematous and with indurated area; per description on admission is less red and even still tender patient reported some improvement.   Data Reviewed: Basic Metabolic Panel:  Recent Labs Lab 03/14/17 1736 03/15/17 0003 03/15/17 0321 03/15/17 0759  NA 128* 133* 133* 135  K 4.3 3.5 4.5 3.8  CL 94* 102 101 102  CO2  --  26 24 26   GLUCOSE 548* 200* 197* 135*  BUN 29* 20 20 19   CREATININE 1.00 0.79 0.82 0.77  CALCIUM  --  8.3* 8.2* 8.5*   CBC:  Recent Labs Lab 03/14/17 1724 03/14/17 1736  WBC 14.0*  --   NEUTROABS 10.5*  --   HGB 13.8 14.6  HCT 40.1 43.0  MCV 85.9  --   PLT 164  --    CBG:  Recent Labs Lab 03/15/17 0530 03/15/17 0631 03/15/17 0747 03/15/17 0901 03/15/17 1157  GLUCAP 164* 132* 126* 167* 189*    Recent Results (from the past 240 hour(s))  MRSA PCR Screening     Status: None   Collection Time: 03/14/17  8:25 PM  Result Value Ref Range  Status   MRSA by PCR NEGATIVE NEGATIVE Final    Comment:        The GeneXpert MRSA Assay (FDA approved for NASAL specimens only), is one component of a comprehensive MRSA colonization surveillance program. It is not intended to diagnose MRSA infection nor to guide or monitor treatment for MRSA infections.      Studies: No results found.  Scheduled Meds: . enoxaparin (LOVENOX) injection  40 mg Subcutaneous Q24H  . insulin aspart  0-15 Units Subcutaneous TID WC  . insulin aspart  0-5 Units Subcutaneous QHS  . insulin aspart  4 Units Subcutaneous TID WC  . insulin glargine  20  Units Subcutaneous Daily   Continuous Infusions: . sodium chloride    . vancomycin Stopped (03/15/17 1059)    Principal Problem:   Cellulitis Active Problems:   Uncontrolled type 2 diabetes mellitus with hyperosmolar nonketotic hyperglycemia (HCC)   HTN (hypertension)   OSA (obstructive sleep apnea)   Hyponatremia   Cellulitis of leg, right   Hyperglycemia    Time spent: 30 minutes    Vassie Loll  [facility] Pager [phone]. If 7PM-7AM, please contact night-coverage at [email or web address], password [email or web address] 03/15/2017, 1:43 PM  LOS: 1 day

## 2017-03-16 ENCOUNTER — Inpatient Hospital Stay (HOSPITAL_COMMUNITY): Payer: Self-pay

## 2017-03-16 DIAGNOSIS — F329 Major depressive disorder, single episode, unspecified: Secondary | ICD-10-CM

## 2017-03-16 DIAGNOSIS — K219 Gastro-esophageal reflux disease without esophagitis: Secondary | ICD-10-CM

## 2017-03-16 LAB — GLUCOSE, CAPILLARY
Glucose-Capillary: 224 mg/dL — ABNORMAL HIGH (ref 65–99)
Glucose-Capillary: 223 mg/dL — ABNORMAL HIGH (ref 65–99)
Glucose-Capillary: 149 mg/dL — ABNORMAL HIGH (ref 65–99)
Glucose-Capillary: 174 mg/dL — ABNORMAL HIGH (ref 65–99)

## 2017-03-16 LAB — SURGICAL PCR SCREEN
MRSA, PCR: NEGATIVE
Staphylococcus aureus: NEGATIVE

## 2017-03-16 LAB — BASIC METABOLIC PANEL
Anion gap: 6 (ref 5–15)
BUN: 15 mg/dL (ref 6–20)
CO2: 27 mmol/L (ref 22–32)
Calcium: 8.6 mg/dL — ABNORMAL LOW (ref 8.9–10.3)
Chloride: 102 mmol/L (ref 101–111)
Creatinine, Ser: 0.8 mg/dL (ref 0.44–1.00)
GFR calc Af Amer: >60 mL/min (ref >60)
GFR calc non Af Amer: >60 mL/min (ref >60)
Glucose, Bld: 249 mg/dL — ABNORMAL HIGH (ref 65–99)
Potassium: 4 mmol/L (ref 3.5–5.1)
Sodium: 135 mmol/L (ref 135–145)

## 2017-03-16 LAB — CBC
HCT: 36.5 % (ref 36.0–46.0)
Hemoglobin: 12.4 g/dL (ref 12.0–15.0)
MCH: 29.5 pg (ref 26.0–34.0)
MCHC: 34 g/dL (ref 30.0–36.0)
MCV: 86.7 fL (ref 78.0–100.0)
Platelets: 163 10*3/uL (ref 150–400)
RBC: 4.21 MIL/uL (ref 3.87–5.11)
RDW: 12.6 % (ref 11.5–15.5)
WBC: 10.4 10*3/uL (ref 4.0–10.5)

## 2017-03-16 LAB — HEMOGLOBIN A1C
Hgb A1c MFr Bld: 15 % — ABNORMAL HIGH (ref 4.8–5.6)
Mean Plasma Glucose: 384 mg/dL

## 2017-03-16 LAB — HIV ANTIBODY (ROUTINE TESTING W REFLEX): HIV Screen 4th Generation wRfx: NONREACTIVE

## 2017-03-16 MED ORDER — PIPERACILLIN-TAZOBACTAM 3.375 G IVPB
3.3750 g | Freq: Three times a day (TID) | INTRAVENOUS | Status: DC
  Administered 2017-03-16 – 2017-03-21 (×14): 3.375 g via INTRAVENOUS
  Filled 2017-03-16 (×17): qty 50

## 2017-03-16 MED ORDER — GADOBENATE DIMEGLUMINE 529 MG/ML IV SOLN
20.0000 mL | Freq: Once | INTRAVENOUS | Status: AC | PRN
  Administered 2017-03-16: 20 mL via INTRAVENOUS

## 2017-03-16 MED FILL — Insulin Aspart Inj 100 Unit/ML: SUBCUTANEOUS | Qty: 0.04 | Status: AC

## 2017-03-16 NOTE — Progress Notes (Signed)
[facility] PROGRESS NOTE  [name] VOZ:[id number] DOB: 1971/07/22 DOA: 03/14/2017 PCP: Patient, No Pcp Per Interim summary and HPI 46 y.o. female with medical history significant for insulin-dependent diabetes mellitus, not taking insulin due to prohibitive cost, now presenting to the emergency department for evaluation of severe pain, swelling, and redness in the right groin. Patient also with HONK.  Assessment/Plan: 1-right inner thigh cellulitis  -patient failed therapy with cleocin as an outpatient  -given worsening in pain, swelling/induration; will check MRI of right thigh and will involved [facility] for potential I&D and debridement. -will also add zosyn per pharmacy. -continue PRN analgesics and warm compresses TID -afebrile and WBC's WNL. -will follow clinical response.  2-uncontrolled hyperglycemia: type 2 diabetes with neuropathy -with HONK on admission -stable now -will continue lantus and SSI -continue modified carb diet  -CM contacted for assistance with meds at discharge -continue neurontin -anticipated discharge meds; 70/30 relion and metformin (due to cost) -will also resume metformin at discharge  -A1C 15  3-hyponatremia -pseudohyponatremia from hyperglycemia -resolved with IVF's and control of CBG's -will monitor electrolytes  4-HTN: -stable overall  -will continue lisinopril   5-GERD -continue PPI  6-Depression -will continue prozac  Code Status: Full code Family Communication: no family at bedside  Disposition Plan:home when medically stable; continue IV antibiotics. [facility] consulted for I&D and debridement.   Consultants:  [facility]  Procedures:  See below for x-ray reports   Antibiotics:  Vancomycin 7/15  Zosyn 7/17  HPI/Subjective: Afebrile, no CP and no SOB. Patient with worsening pain and induration feeling on her right thigh. WBC's WNL and with improvement on erythema.  Objective: Vitals:   03/16/17 1730 03/16/17 2053  BP:  96/62 101/68  Pulse: 77 76  Resp: 16 16  Temp: 98.9 F (37.2 C) 99.2 F (37.3 C)    Intake/Output Summary (Last 24 hours) at 03/16/17 2057 Last data filed at 03/16/17 1613  Gross per 24 hour  Intake             1242 ml  Output                0 ml  Net             1242 ml   Filed Weights   03/14/17 1852 03/15/17 1552  Weight: 88.9 kg (196 lb) 92.1 kg (203 lb)    Exam:   General: afebrile, denies CP and SOB. No nausea, no vomiting. Reported right thigh pain has worsen and is feeling more indurated, even erythema has improved.  Cardiovascular: S1 and S2, no rubs, no gallops.  Respiratory: no wheezing, no crackles, good air movement.   Abdomen: soft, NT, ND, positive   Musculoskeletal: right inner thigh with worsening swelling, induration and excruciating pain. Erythema is better.  Data Reviewed: Basic Metabolic Panel:  Recent Labs Lab 03/14/17 1736 03/15/17 0003 03/15/17 0321 03/15/17 0759 03/16/17 0402  NA 128* 133* 133* 135 135  K 4.3 3.5 4.5 3.8 4.0  CL 94* 102 101 102 102  CO2  --  26 24 26 27   GLUCOSE 548* 200* 197* 135* 249*  BUN 29* 20 20 19 15   CREATININE 1.00 0.79 0.82 0.77 0.80  CALCIUM  --  8.3* 8.2* 8.5* 8.6*   CBC:  Recent Labs Lab 03/14/17 1724 03/14/17 1736 03/16/17 0402  WBC 14.0*  --  10.4  NEUTROABS 10.5*  --   --   HGB 13.8 14.6 12.4  HCT 40.1 43.0 36.5  MCV 85.9  --  86.7  PLT 164  --  163   CBG:  Recent Labs Lab 03/15/17 1709 03/15/17 2157 03/16/17 0751 03/16/17 1206 03/16/17 1733  GLUCAP 110* 189* 224* 223* 149*    Recent Results (from the past 240 hour(s))  MRSA PCR Screening     Status: None   Collection Time: 03/14/17  8:25 PM  Result Value Ref Range Status   MRSA by PCR NEGATIVE NEGATIVE Final    Comment:        The GeneXpert MRSA Assay (FDA approved for NASAL specimens only), is one component of a comprehensive MRSA colonization surveillance program. It is not intended to diagnose MRSA infection nor to  guide or monitor treatment for MRSA infections.      Studies: Mr Femur Right W Wo Contrast  Result Date: 03/16/2017 CLINICAL DATA:  Abscess of right thigh, right sided inner upper thigh. Redness swelling and heat. eval for extent of infection. Pt unable to keep legs together due to pain. EXAM: MRI OF THE RIGHT FEMUR WITHOUT AND WITH CONTRAST TECHNIQUE: Multiplanar, multisequence MR imaging of the left femur was performed both before and after administration of intravenous contrast. CONTRAST:  17mL MULTIHANCE GADOBENATE DIMEGLUMINE 529 MG/ML IV SOLN COMPARISON:  None. FINDINGS: Bones/Joint/Cartilage No marrow signal abnormality. No fracture or dislocation. Normal alignment. No joint effusion. Ligaments Collateral ligaments are intact. Muscles and Tendons Soft tissue edema in the of medial upper right thigh with enhancement on postcontrast imaging. Areas of non enhancement within the subcutaneous fat concerning for areas of necrosis measuring approximately 5.2 x 2.6 x 5.7 cm. Perifascial edema surrounding the superior aspect of the right gracilis muscle. Mild edema in the superior aspect of the right gracilis muscle. No intramuscular fluid collection or hematoma. Remainder the muscles are normal in signal.  No muscle atrophy. Soft tissue No other fluid collection or hematoma.  No soft tissue mass. IMPRESSION: Cellulitis of the upper medial right thigh with mild fasciitis involving the right upper gracilis muscle. Mild muscle edema in the upper right gracilis muscle likely reactive versus less likely secondary to mild myositis. Areas of non enhancement within the subcutaneous fat concerning for areas of necrosis. Electronically Signed   By: Elige Ko   On: 03/16/2017 12:27    Scheduled Meds: . aspirin EC  325 mg Oral Daily  . enoxaparin (LOVENOX) injection  40 mg Subcutaneous Q24H  . FLUoxetine  40 mg Oral Daily  . insulin aspart  0-15 Units Subcutaneous TID WC  . insulin aspart  0-5 Units  Subcutaneous QHS  . insulin aspart  4 Units Subcutaneous TID WC  . insulin glargine  20 Units Subcutaneous Daily  . lisinopril  20 mg Oral Daily  . pantoprazole  40 mg Oral Daily   Continuous Infusions: . sodium chloride 1,000 mL (03/16/17 0728)  . piperacillin-tazobactam (ZOSYN)  IV Stopped (03/16/17 1913)  . vancomycin Stopped (03/16/17 1033)    Principal Problem:   Cellulitis Active Problems:   Uncontrolled type 2 diabetes mellitus with hyperosmolar nonketotic hyperglycemia (HCC)   HTN (hypertension)   OSA (obstructive sleep apnea)   Hyponatremia   Cellulitis of leg, right   Hyperglycemia    Time spent: 30 minutes    Vassie Loll  [facility] Pager [phone]. If 7PM-7AM, please contact night-coverage at [email or web address], password [facility] 03/16/2017, 8:57 PM  LOS: 2 days

## 2017-03-16 NOTE — Consult Note (Signed)
[facility] Surgery Consult Note  [name] 28-Sep-1970  [id number].    Requesting MD: Dyann Kief, MD Chief Complaint/Reason for Consult: Right thigh soft tissue infection  HPI:  [name] is a 46 year old female with a past medical history of uncontrolled diabetes mellitus, hypertension, tobacco abuse, and rheumatoid arthritis who presented to [facility] emergency department on 03/14/2017 with worsening right thigh cellulitis. She states that on 7/12 she noticed some right thigh redness which gradually worsened over the next 24 hours and umbilical developed so she presented to the emergency department in [place] for evaluation. In the emergency department she reports that local anesthesia was injected into her right thigh and injection of a needle elicited pus. After this the emergency room physician used to scalpel to make a small incision and did not encounter any pus so he covered the wound with a Band-Aid, prescribed clindamycin, and discharge the patient. Patient reports worsening cellulitis which brought her to the emergency room on the 15th. She was admitted by the medical service for IV antibiotic treatment, blood glucose control, and IV fluid rehydration. General surgery has been asked to consult due to persistent pain, erythema, and concern for soft tissue necrosis. She reports a surgical history of appendectomy, cholecystectomy, and excision of cyst from her posterior thigh. She denies the use of blood thinning medications. She denies previous complications undergoing general anesthesia.   MRI of right femur significant for soft tissue edema with a 5 x 2.6 x 5.7 cm area concerning for fat necrosis. No appreciable abscess. Leukocytosis has resolved on IV vanc.  ROS: Review of Systems  Constitutional: Negative for chills and fever.  Respiratory: Negative for shortness of breath.   Cardiovascular: Negative for chest pain.  Gastrointestinal: Negative for abdominal pain.    Genitourinary: Negative.   Musculoskeletal:       Right thigh pain    Family History  Problem Relation Age of Onset  . Aneurysm Mother   . Heart attack Father   . Stroke Father   . Breast cancer Paternal Grandmother     Past Medical History:  Diagnosis Date  . Arthritis    fingers  . Depression   . Diabetes mellitus   . Dyspnea 11/22/2013  . Hypertension   . Kidney stones   . PONV (postoperative nausea and vomiting)   . Sleep apnea    lost 70lbs no cpap x25yr now    Past Surgical History:  Procedure Laterality Date  . CARDIAC CATHETERIZATION     4-585yrago  . CHOLECYSTECTOMY    . ENDOMETRIAL ABLATION     6 years ago  . LAPAROSCOPIC APPENDECTOMY  10/04/2011   Procedure: APPENDECTOMY LAPAROSCOPIC;  Surgeon: BrJudieth KeensDO;  Location: [facility] ORS;  Service: General;  Laterality: N/A;    Social History:  reports that she has been smoking Cigarettes.  She has a 24.00 pack-year smoking history. She has never used smokeless tobacco. She reports that she drinks alcohol. She reports that she does not use drugs.  Allergies:  Allergies  Allergen Reactions  . Codeine Itching  . Sulfa Antibiotics Hives    Medications Prior to Admission  Medication Sig Dispense Refill  . aspirin EC 325 MG tablet Take 325 mg by mouth daily.    . clindamycin (CLEOCIN) 300 MG capsule take 1 capsule by mouth every 6 hours for 10 days  0  . esomeprazole (NEXIUM) 40 MG capsule Take 40 mg by mouth daily at 12 noon.    . Marland KitchenLUoxetine (  PROZAC) 40 MG capsule Take 40 mg by mouth daily.    Marland Kitchen gabapentin (NEURONTIN) 300 MG capsule Take 300 mg by mouth daily as needed (pain).   2  . lisinopril (PRINIVIL,ZESTRIL) 20 MG tablet Take 1 tablet (20 mg total) by mouth daily. 30 tablet 6  . meloxicam (MOBIC) 7.5 MG tablet TAKE 1 TABLET (7.5 MG) BY ORAL ROUTE ONCE DAILY FOR 30 DAYS  2  . metFORMIN (GLUCOPHAGE-XR) 500 MG 24 hr tablet Take 500 mg by mouth 2 (two) times daily with a meal.      Blood pressure 93/67,  pulse 84, temperature 99.4 F (37.4 C), temperature source Oral, resp. rate 16, height 5' 10"  (1.778 m), weight 92.1 kg (203 lb), SpO2 100 %. Physical Exam: Physical Exam  Constitutional: She appears well-developed and well-nourished. No distress.  HENT:  Head: Normocephalic and atraumatic.  Right Ear: External ear normal.  Left Ear: External ear normal.  Nose: Nose normal.  Eyes: EOM are normal. Right eye exhibits no discharge. Left eye exhibits no discharge. No scleral icterus.  Neck: Normal range of motion. Neck supple. No tracheal deviation present.  Cardiovascular: Normal rate, regular rhythm, normal heart sounds and intact distal pulses.  Exam reveals no friction rub.   No murmur heard. Pulmonary/Chest: Breath sounds normal. No stridor. She is in respiratory distress. She has no wheezes.  Abdominal: Soft. Bowel sounds are normal. She exhibits no distension. There is no tenderness.  Musculoskeletal: Normal range of motion. She exhibits tenderness. She exhibits no deformity.  Right medial thigh with large area of cellulitis, improved compared to previous photos. The medial aspect of her thigh significant for a roughly 8 cm area of indurated tissue, no appreciable fluctuance. There is a small 3-4 cm area of induration along the anterior medial thigh as well. See image below.  Skin: She is not diaphoretic.      Results for orders placed or performed during the hospital encounter of 03/14/17 (from the past 48 hour(s))  CBC with Differential     Status: Abnormal   Collection Time: 03/14/17  5:24 PM  Result Value Ref Range   WBC 14.0 (H) 4.0 - 10.5 K/uL   RBC 4.67 3.87 - 5.11 MIL/uL   Hemoglobin 13.8 12.0 - 15.0 g/dL   HCT 40.1 36.0 - 46.0 %   MCV 85.9 78.0 - 100.0 fL   MCH 29.6 26.0 - 34.0 pg   MCHC 34.4 30.0 - 36.0 g/dL   RDW 12.4 11.5 - 15.5 %   Platelets 164 150 - 400 K/uL   Neutrophils Relative % 75 %   Neutro Abs 10.5 (H) 1.7 - 7.7 K/uL   Lymphocytes Relative 20 %   Lymphs  Abs 2.8 0.7 - 4.0 K/uL   Monocytes Relative 5 %   Monocytes Absolute 0.7 0.1 - 1.0 K/uL   Eosinophils Relative 0 %   Eosinophils Absolute 0.0 0.0 - 0.7 K/uL   Basophils Relative 0 %   Basophils Absolute 0.0 0.0 - 0.1 K/uL  I-stat Chem 8, ED     Status: Abnormal   Collection Time: 03/14/17  5:36 PM  Result Value Ref Range   Sodium 128 (L) 135 - 145 mmol/L   Potassium 4.3 3.5 - 5.1 mmol/L   Chloride 94 (L) 101 - 111 mmol/L   BUN 29 (H) 6 - 20 mg/dL   Creatinine, Ser 1.00 0.44 - 1.00 mg/dL   Glucose, Bld 548 (HH) 65 - 99 mg/dL   Calcium, Ion 1.04 (L) 1.15 -  1.40 mmol/L   TCO2 26 0 - 100 mmol/L   Hemoglobin 14.6 12.0 - 15.0 g/dL   HCT 43.0 36.0 - 46.0 %   Comment NOTIFIED PHYSICIAN   I-Stat CG4 Lactic Acid, ED     Status: None   Collection Time: 03/14/17  5:36 PM  Result Value Ref Range   Lactic Acid, Venous 1.09 0.5 - 1.9 mmol/L  POC CBG, ED     Status: Abnormal   Collection Time: 03/14/17  6:11 PM  Result Value Ref Range   Glucose-Capillary 525 (HH) 65 - 99 mg/dL  MRSA PCR Screening     Status: None   Collection Time: 03/14/17  8:25 PM  Result Value Ref Range   MRSA by PCR NEGATIVE NEGATIVE    Comment:        The GeneXpert MRSA Assay (FDA approved for NASAL specimens only), is one component of a comprehensive MRSA colonization surveillance program. It is not intended to diagnose MRSA infection nor to guide or monitor treatment for MRSA infections.   Glucose, capillary     Status: Abnormal   Collection Time: 03/14/17  8:36 PM  Result Value Ref Range   Glucose-Capillary 417 (H) 65 - 99 mg/dL  Glucose, capillary     Status: Abnormal   Collection Time: 03/14/17  9:45 PM  Result Value Ref Range   Glucose-Capillary 328 (H) 65 - 99 mg/dL   Comment 1 Notify RN    Comment 2 Document in Chart   Glucose, capillary     Status: Abnormal   Collection Time: 03/14/17 10:44 PM  Result Value Ref Range   Glucose-Capillary 244 (H) 65 - 99 mg/dL   Comment 1 Notify RN    Comment 2  Document in Chart   Glucose, capillary     Status: Abnormal   Collection Time: 03/14/17 11:45 PM  Result Value Ref Range   Glucose-Capillary 214 (H) 65 - 99 mg/dL  Basic metabolic panel     Status: Abnormal   Collection Time: 03/15/17 12:03 AM  Result Value Ref Range   Sodium 133 (L) 135 - 145 mmol/L   Potassium 3.5 3.5 - 5.1 mmol/L    Comment: DELTA CHECK NOTED   Chloride 102 101 - 111 mmol/L   CO2 26 22 - 32 mmol/L   Glucose, Bld 200 (H) 65 - 99 mg/dL   BUN 20 6 - 20 mg/dL   Creatinine, Ser 0.79 0.44 - 1.00 mg/dL   Calcium 8.3 (L) 8.9 - 10.3 mg/dL   GFR calc non Af Amer >60 >60 mL/min   GFR calc Af Amer >60 >60 mL/min    Comment: (NOTE) The eGFR has been calculated using the CKD EPI equation. This calculation has not been validated in all clinical situations. eGFR's persistently <60 mL/min signify possible Chronic Kidney Disease.    Anion gap 5 5 - 15  Glucose, capillary     Status: Abnormal   Collection Time: 03/15/17 12:55 AM  Result Value Ref Range   Glucose-Capillary 116 (H) 65 - 99 mg/dL   Comment 1 Document in Chart   Glucose, capillary     Status: Abnormal   Collection Time: 03/15/17  2:02 AM  Result Value Ref Range   Glucose-Capillary 155 (H) 65 - 99 mg/dL   Comment 1 Document in Chart   Glucose, capillary     Status: Abnormal   Collection Time: 03/15/17  3:06 AM  Result Value Ref Range   Glucose-Capillary 178 (H) 65 - 99 mg/dL  Basic metabolic panel     Status: Abnormal   Collection Time: 03/15/17  3:21 AM  Result Value Ref Range   Sodium 133 (L) 135 - 145 mmol/L   Potassium 4.5 3.5 - 5.1 mmol/L    Comment: DELTA CHECK NOTED SLIGHT HEMOLYSIS    Chloride 101 101 - 111 mmol/L   CO2 24 22 - 32 mmol/L   Glucose, Bld 197 (H) 65 - 99 mg/dL   BUN 20 6 - 20 mg/dL   Creatinine, Ser 0.82 0.44 - 1.00 mg/dL   Calcium 8.2 (L) 8.9 - 10.3 mg/dL   GFR calc non Af Amer >60 >60 mL/min   GFR calc Af Amer >60 >60 mL/min    Comment: (NOTE) The eGFR has been calculated  using the CKD EPI equation. This calculation has not been validated in all clinical situations. eGFR's persistently <60 mL/min signify possible Chronic Kidney Disease.    Anion gap 8 5 - 15  Glucose, capillary     Status: Abnormal   Collection Time: 03/15/17  4:21 AM  Result Value Ref Range   Glucose-Capillary 246 (H) 65 - 99 mg/dL   Comment 1 Document in Chart   Glucose, capillary     Status: Abnormal   Collection Time: 03/15/17  5:30 AM  Result Value Ref Range   Glucose-Capillary 164 (H) 65 - 99 mg/dL  Glucose, capillary     Status: Abnormal   Collection Time: 03/15/17  6:31 AM  Result Value Ref Range   Glucose-Capillary 132 (H) 65 - 99 mg/dL  Glucose, capillary     Status: Abnormal   Collection Time: 03/15/17  7:47 AM  Result Value Ref Range   Glucose-Capillary 126 (H) 65 - 99 mg/dL   Comment 1 Notify RN    Comment 2 Document in Chart   HIV antibody (Routine Testing)     Status: None   Collection Time: 03/15/17  7:59 AM  Result Value Ref Range   HIV Screen 4th Generation wRfx Non Reactive Non Reactive    Comment: (NOTE) Performed At: [facility] [place], [place] [id number] Lindon Romp MD SW:[id number]   Basic metabolic panel     Status: Abnormal   Collection Time: 03/15/17  7:59 AM  Result Value Ref Range   Sodium 135 135 - 145 mmol/L   Potassium 3.8 3.5 - 5.1 mmol/L   Chloride 102 101 - 111 mmol/L   CO2 26 22 - 32 mmol/L   Glucose, Bld 135 (H) 65 - 99 mg/dL   BUN 19 6 - 20 mg/dL   Creatinine, Ser 0.77 0.44 - 1.00 mg/dL   Calcium 8.5 (L) 8.9 - 10.3 mg/dL   GFR calc non Af Amer >60 >60 mL/min   GFR calc Af Amer >60 >60 mL/min    Comment: (NOTE) The eGFR has been calculated using the CKD EPI equation. This calculation has not been validated in all clinical situations. eGFR's persistently <60 mL/min signify possible Chronic Kidney Disease.    Anion gap 7 5 - 15  Hemoglobin A1c     Status: Abnormal   Collection Time: 03/15/17  7:59 AM   Result Value Ref Range   Hgb A1c MFr Bld 15.0 (H) 4.8 - 5.6 %    Comment: (NOTE)         Pre-diabetes: 5.7 - 6.4         Diabetes: >6.4         Glycemic control for adults with diabetes: <7.0  Mean Plasma Glucose 384 mg/dL    Comment: (NOTE) Performed At: [facility] [place], [place] [id number] Lindon Romp MD TG:[id number]   Glucose, capillary     Status: Abnormal   Collection Time: 03/15/17  9:01 AM  Result Value Ref Range   Glucose-Capillary 167 (H) 65 - 99 mg/dL  Glucose, capillary     Status: Abnormal   Collection Time: 03/15/17 11:57 AM  Result Value Ref Range   Glucose-Capillary 189 (H) 65 - 99 mg/dL   Comment 1 Notify RN    Comment 2 Document in Chart   Glucose, capillary     Status: Abnormal   Collection Time: 03/15/17  5:09 PM  Result Value Ref Range   Glucose-Capillary 110 (H) 65 - 99 mg/dL   Comment 1 Notify RN    Comment 2 Document in Chart   Glucose, capillary     Status: Abnormal   Collection Time: 03/15/17  9:57 PM  Result Value Ref Range   Glucose-Capillary 189 (H) 65 - 99 mg/dL  Basic metabolic panel     Status: Abnormal   Collection Time: 03/16/17  4:02 AM  Result Value Ref Range   Sodium 135 135 - 145 mmol/L   Potassium 4.0 3.5 - 5.1 mmol/L   Chloride 102 101 - 111 mmol/L   CO2 27 22 - 32 mmol/L   Glucose, Bld 249 (H) 65 - 99 mg/dL   BUN 15 6 - 20 mg/dL   Creatinine, Ser 0.80 0.44 - 1.00 mg/dL   Calcium 8.6 (L) 8.9 - 10.3 mg/dL   GFR calc non Af Amer >60 >60 mL/min   GFR calc Af Amer >60 >60 mL/min    Comment: (NOTE) The eGFR has been calculated using the CKD EPI equation. This calculation has not been validated in all clinical situations. eGFR's persistently <60 mL/min signify possible Chronic Kidney Disease.    Anion gap 6 5 - 15  CBC     Status: None   Collection Time: 03/16/17  4:02 AM  Result Value Ref Range   WBC 10.4 4.0 - 10.5 K/uL   RBC 4.21 3.87 - 5.11 MIL/uL   Hemoglobin 12.4 12.0 - 15.0 g/dL    HCT 36.5 36.0 - 46.0 %   MCV 86.7 78.0 - 100.0 fL   MCH 29.5 26.0 - 34.0 pg   MCHC 34.0 30.0 - 36.0 g/dL   RDW 12.6 11.5 - 15.5 %   Platelets 163 150 - 400 K/uL  Glucose, capillary     Status: Abnormal   Collection Time: 03/16/17  7:51 AM  Result Value Ref Range   Glucose-Capillary 224 (H) 65 - 99 mg/dL   Comment 1 Notify RN    Comment 2 Document in Chart   Glucose, capillary     Status: Abnormal   Collection Time: 03/16/17 12:06 PM  Result Value Ref Range   Glucose-Capillary 223 (H) 65 - 99 mg/dL   Comment 1 Notify RN    Comment 2 Document in Chart    Mr Femur Right W Wo Contrast  Result Date: 03/16/2017 CLINICAL DATA:  Abscess of right thigh, right sided inner upper thigh. Redness swelling and heat. eval for extent of infection. Pt unable to keep legs together due to pain. EXAM: MRI OF THE RIGHT FEMUR WITHOUT AND WITH CONTRAST TECHNIQUE: Multiplanar, multisequence MR imaging of the left femur was performed both before and after administration of intravenous contrast. CONTRAST:  19m MULTIHANCE GADOBENATE DIMEGLUMINE 529 MG/ML IV SOLN COMPARISON:  None. FINDINGS: Bones/Joint/Cartilage No marrow signal abnormality. No fracture or dislocation. Normal alignment. No joint effusion. Ligaments Collateral ligaments are intact. Muscles and Tendons Soft tissue edema in the of medial upper right thigh with enhancement on postcontrast imaging. Areas of non enhancement within the subcutaneous fat concerning for areas of necrosis measuring approximately 5.2 x 2.6 x 5.7 cm. Perifascial edema surrounding the superior aspect of the right gracilis muscle. Mild edema in the superior aspect of the right gracilis muscle. No intramuscular fluid collection or hematoma. Remainder the muscles are normal in signal.  No muscle atrophy. Soft tissue No other fluid collection or hematoma.  No soft tissue mass. IMPRESSION: Cellulitis of the upper medial right thigh with mild fasciitis involving the right upper gracilis  muscle. Mild muscle edema in the upper right gracilis muscle likely reactive versus less likely secondary to mild myositis. Areas of non enhancement within the subcutaneous fat concerning for areas of necrosis. Electronically Signed   By: Kathreen Devoid   On: 03/16/2017 12:27   Assessment/Plan Soft tissue infection of right medial thigh - Soft tissue infection refractory to antibiotic treatment. Recommend nothing by mouth after midnight and OR tomorrow for irrigation and debridement by Dr. Michael Boston.  - OK for Lovenox as scheduled at [place], [facility] Surgery 03/16/2017, 3:03 PM Pager: [phone] Consults: [phone] Mon-Fri 7:00 am-4:30 pm Sat-Sun 7:00 am-11:30 am

## 2017-03-16 NOTE — Progress Notes (Addendum)
Pharmacy Antibiotic Note  [name] is a 46 y.o. female admitted on 03/14/2017 with cellulitis.  Pharmacy has been consulted for vancomycin dosing.  PMH is  significant for insulin dependent DM, HTNwho presents to the Emergency Department complaining of progressively worsening abscess to right inner groin/thigh x 3 days. Friday she was seen at [facility] ED in [place] for this, where she was diagnosed with an abscess, an I&D was preformed and she was put on clindamycin. She states at the time of I&D the redness was ~8cm. The I&D expelled purulent drainage.She states she has not missed a dose. Since being discharge the patient notes increase pain and spreading redness of the inner right thigh/groin. Reports compliance to clindamycin  Per Surgery, add Zosyn 7/17  Plan:  Vancomycin 1000  IV every 12 hours.  Goal trough 10-15 mcg/mL.  Begin Zosyn 3.375 g IV every 8 hrs by 4-hr infusion  Monitor clinical course, renal function, cultures as available    Height: 5\' 10"  (177.8 cm) Weight: 203 lb (92.1 kg) IBW/kg (Calculated) : 68.5  Temp (24hrs), Avg:98.6 F (37 C), Min:98.1 F (36.7 C), Max:99.1 F (37.3 C)   Recent Labs Lab 03/14/17 1724 03/14/17 1736 03/15/17 0003 03/15/17 0321 03/15/17 0759 03/16/17 0402  WBC 14.0*  --   --   --   --  10.4  CREATININE  --  1.00 0.79 0.82 0.77 0.80  LATICACIDVEN  --  1.09  --   --   --   --     Estimated Creatinine Clearance: 105.8 mL/min (by C-G formula based on SCr of 0.8 mg/dL).    Allergies  Allergen Reactions  . Codeine Itching  . Sulfa Antibiotics Hives    Antimicrobials this admission: 7/15 vancomycin >>  7/17 Zosyn >>  PTA clindamycin  PO started on 7/13  Dose adjustments this admission: ---  Microbiology results:  7/15 MRSA PCR: neg   Thank you for allowing pharmacy to be a part of this patient's care.   8/15, PharmD, BCPS Pager: [phone] 03/16/2017, 2:35 PM

## 2017-03-17 ENCOUNTER — Inpatient Hospital Stay (HOSPITAL_COMMUNITY): Payer: Self-pay | Admitting: Registered Nurse

## 2017-03-17 ENCOUNTER — Encounter (HOSPITAL_COMMUNITY)
Admission: EM | Disposition: A | Discharge status: Home / Self Care | Payer: Self-pay | Source: Home / Self Care | Attending: Internal Medicine

## 2017-03-17 ENCOUNTER — Encounter (HOSPITAL_COMMUNITY): Payer: Self-pay

## 2017-03-17 DIAGNOSIS — L02415 Cutaneous abscess of right lower limb: Secondary | ICD-10-CM

## 2017-03-17 HISTORY — PX: IRRIGATION AND DEBRIDEMENT ABSCESS: SHX5252

## 2017-03-17 LAB — CBC
HCT: 35.5 % — ABNORMAL LOW (ref 36.0–46.0)
Hemoglobin: 12.1 g/dL (ref 12.0–15.0)
MCH: 29.2 pg (ref 26.0–34.0)
MCHC: 34.1 g/dL (ref 30.0–36.0)
MCV: 85.5 fL (ref 78.0–100.0)
Platelets: 177 10*3/uL (ref 150–400)
RBC: 4.15 MIL/uL (ref 3.87–5.11)
RDW: 12.3 % (ref 11.5–15.5)
WBC: 9.7 10*3/uL (ref 4.0–10.5)

## 2017-03-17 LAB — BASIC METABOLIC PANEL
Anion gap: 7 (ref 5–15)
BUN: 14 mg/dL (ref 6–20)
CO2: 26 mmol/L (ref 22–32)
Calcium: 8.6 mg/dL — ABNORMAL LOW (ref 8.9–10.3)
Chloride: 103 mmol/L (ref 101–111)
Creatinine, Ser: 0.94 mg/dL (ref 0.44–1.00)
GFR calc Af Amer: >60 mL/min (ref >60)
GFR calc non Af Amer: >60 mL/min (ref >60)
Glucose, Bld: 328 mg/dL — ABNORMAL HIGH (ref 65–99)
Potassium: 4.6 mmol/L (ref 3.5–5.1)
Sodium: 136 mmol/L (ref 135–145)

## 2017-03-17 LAB — GLUCOSE, CAPILLARY
Glucose-Capillary: 261 mg/dL — ABNORMAL HIGH (ref 65–99)
Glucose-Capillary: 219 mg/dL — ABNORMAL HIGH (ref 65–99)
Glucose-Capillary: 193 mg/dL — ABNORMAL HIGH (ref 65–99)
Glucose-Capillary: 272 mg/dL — ABNORMAL HIGH (ref 65–99)
Glucose-Capillary: 324 mg/dL — ABNORMAL HIGH (ref 65–99)
Glucose-Capillary: 405 mg/dL — ABNORMAL HIGH (ref 65–99)

## 2017-03-17 LAB — VANCOMYCIN, TROUGH: Vancomycin Tr: 18 ug/mL (ref 15–20)

## 2017-03-17 SURGERY — IRRIGATION AND DEBRIDEMENT ABSCESS
Anesthesia: General | Site: Thigh | Laterality: Right

## 2017-03-17 MED ORDER — ALUM & MAG HYDROXIDE-SIMETH 200-200-20 MG/5ML PO SUSP
30.0000 mL | Freq: Four times a day (QID) | ORAL | Status: DC | PRN
  Filled 2017-03-17: qty 30

## 2017-03-17 MED ORDER — FENTANYL CITRATE (PF) 100 MCG/2ML IJ SOLN
INTRAMUSCULAR | Status: AC
  Filled 2017-03-17: qty 2

## 2017-03-17 MED ORDER — 0.9 % SODIUM CHLORIDE (POUR BTL) OPTIME
TOPICAL | Status: DC | PRN
  Administered 2017-03-17: 1000 mL

## 2017-03-17 MED ORDER — POLYETHYLENE GLYCOL 3350 17 G PO PACK
17.0000 g | PACK | Freq: Two times a day (BID) | ORAL | Status: DC | PRN
  Filled 2017-03-17: qty 1

## 2017-03-17 MED ORDER — GABAPENTIN 300 MG PO CAPS
300.0000 mg | ORAL_CAPSULE | Freq: Every day | ORAL | Status: AC
  Administered 2017-03-17 – 2017-03-19 (×3): 300 mg via ORAL
  Filled 2017-03-17 (×3): qty 1

## 2017-03-17 MED ORDER — NICOTINE 14 MG/24HR TD PT24
14.0000 mg | MEDICATED_PATCH | Freq: Every day | TRANSDERMAL | Status: DC
  Filled 2017-03-17 (×5): qty 1

## 2017-03-17 MED ORDER — MIDAZOLAM HCL 2 MG/2ML IJ SOLN
INTRAMUSCULAR | Status: AC
  Filled 2017-03-17: qty 2

## 2017-03-17 MED ORDER — SCOPOLAMINE 1 MG/3DAYS TD PT72
MEDICATED_PATCH | TRANSDERMAL | Status: AC
  Filled 2017-03-17: qty 1

## 2017-03-17 MED ORDER — OXYCODONE HCL 5 MG PO TABS: 5.0000 mg | ORAL_TABLET | Freq: Once | ORAL | Status: DC | PRN

## 2017-03-17 MED ORDER — HYDROCORTISONE 1 % EX CREA: 1.0000 "application " | TOPICAL_CREAM | Freq: Three times a day (TID) | CUTANEOUS | Status: DC | PRN

## 2017-03-17 MED ORDER — PROCHLORPERAZINE EDISYLATE 5 MG/ML IJ SOLN: 5.0000 mg | INTRAMUSCULAR | Status: DC | PRN

## 2017-03-17 MED ORDER — LIP MEDEX EX OINT
1.0000 "application " | TOPICAL_OINTMENT | Freq: Two times a day (BID) | CUTANEOUS | Status: DC
  Administered 2017-03-17 – 2017-03-21 (×9): 1 via TOPICAL
  Filled 2017-03-17 (×3): qty 7

## 2017-03-17 MED ORDER — SODIUM CHLORIDE 0.9 % IV SOLN: 250.0000 mL | INTRAVENOUS | Status: DC | PRN

## 2017-03-17 MED ORDER — MELOXICAM 15 MG PO TABS
15.0000 mg | ORAL_TABLET | Freq: Every day | ORAL | Status: DC
  Administered 2017-03-17 – 2017-03-19 (×3): 15 mg via ORAL
  Filled 2017-03-17 (×4): qty 1

## 2017-03-17 MED ORDER — ONDANSETRON HCL 4 MG/2ML IJ SOLN: 4.0000 mg | Freq: Four times a day (QID) | INTRAMUSCULAR | Status: DC | PRN

## 2017-03-17 MED ORDER — METHOCARBAMOL 1000 MG/10ML IJ SOLN
1000.0000 mg | Freq: Four times a day (QID) | INTRAMUSCULAR | Status: DC | PRN
  Filled 2017-03-17: qty 10

## 2017-03-17 MED ORDER — DIPHENHYDRAMINE HCL 50 MG/ML IJ SOLN: 12.5000 mg | Freq: Four times a day (QID) | INTRAMUSCULAR | Status: DC | PRN

## 2017-03-17 MED ORDER — PHENOL 1.4 % MT LIQD: 1.0000 | OROMUCOSAL | Status: DC | PRN

## 2017-03-17 MED ORDER — LIDOCAINE 2% (20 MG/ML) 5 ML SYRINGE
INTRAMUSCULAR | Status: DC | PRN
  Administered 2017-03-17: 100 mg via INTRAVENOUS

## 2017-03-17 MED ORDER — FENTANYL CITRATE (PF) 100 MCG/2ML IJ SOLN
INTRAMUSCULAR | Status: DC | PRN
  Administered 2017-03-17 (×4): 50 ug via INTRAVENOUS

## 2017-03-17 MED ORDER — ONDANSETRON HCL 4 MG/2ML IJ SOLN
INTRAMUSCULAR | Status: AC
  Filled 2017-03-17: qty 2

## 2017-03-17 MED ORDER — GUAIFENESIN-DM 100-10 MG/5ML PO SYRP
10.0000 mL | ORAL_SOLUTION | ORAL | Status: DC | PRN
  Filled 2017-03-17: qty 10

## 2017-03-17 MED ORDER — OXYCODONE HCL 5 MG PO TABS
5.0000 mg | ORAL_TABLET | ORAL | Status: DC | PRN
  Administered 2017-03-17: 10 mg via ORAL
  Filled 2017-03-17: qty 2

## 2017-03-17 MED ORDER — METHOCARBAMOL 500 MG PO TABS: 1000.0000 mg | ORAL_TABLET | Freq: Four times a day (QID) | ORAL | Status: DC | PRN

## 2017-03-17 MED ORDER — SODIUM CHLORIDE 0.9% FLUSH: 3.0000 mL | INTRAVENOUS | Status: DC | PRN

## 2017-03-17 MED ORDER — ONDANSETRON HCL 4 MG/2ML IJ SOLN
INTRAMUSCULAR | Status: DC | PRN
  Administered 2017-03-17: 4 mg via INTRAVENOUS

## 2017-03-17 MED ORDER — FENTANYL CITRATE (PF) 100 MCG/2ML IJ SOLN
25.0000 ug | INTRAMUSCULAR | Status: DC | PRN
  Administered 2017-03-17: 50 ug via INTRAVENOUS

## 2017-03-17 MED ORDER — LACTATED RINGERS IV BOLUS (SEPSIS): 1000.0000 mL | Freq: Three times a day (TID) | INTRAVENOUS | Status: AC | PRN

## 2017-03-17 MED ORDER — LIDOCAINE 2% (20 MG/ML) 5 ML SYRINGE
INTRAMUSCULAR | Status: AC
  Filled 2017-03-17: qty 5

## 2017-03-17 MED ORDER — LACTATED RINGERS IV SOLN: INTRAVENOUS | Status: DC

## 2017-03-17 MED ORDER — SCOPOLAMINE 1 MG/3DAYS TD PT72
MEDICATED_PATCH | TRANSDERMAL | Status: DC | PRN
  Administered 2017-03-17: 1 via TRANSDERMAL

## 2017-03-17 MED ORDER — PSYLLIUM 95 % PO PACK
1.0000 | PACK | Freq: Two times a day (BID) | ORAL | Status: DC
  Administered 2017-03-17 – 2017-03-19 (×5): 1 via ORAL
  Filled 2017-03-17 (×6): qty 1

## 2017-03-17 MED ORDER — SODIUM CHLORIDE 0.9% FLUSH
3.0000 mL | Freq: Two times a day (BID) | INTRAVENOUS | Status: DC
  Administered 2017-03-18: 3 mL via INTRAVENOUS

## 2017-03-17 MED ORDER — PROPOFOL 10 MG/ML IV BOLUS
INTRAVENOUS | Status: AC
Start: 2017-03-17 — End: 2017-03-17
  Filled 2017-03-17: qty 20

## 2017-03-17 MED ORDER — INSULIN ASPART 100 UNIT/ML ~~LOC~~ SOLN
8.0000 [IU] | Freq: Once | SUBCUTANEOUS | Status: AC
  Administered 2017-03-17: 8 [IU] via SUBCUTANEOUS

## 2017-03-17 MED ORDER — MAGIC MOUTHWASH
15.0000 mL | Freq: Four times a day (QID) | ORAL | Status: DC | PRN
  Filled 2017-03-17: qty 15

## 2017-03-17 MED ORDER — LACTATED RINGERS IV SOLN
INTRAVENOUS | Status: DC | PRN
  Administered 2017-03-17: 1000 mL
  Administered 2017-03-17: 11:00:00 via INTRAVENOUS

## 2017-03-17 MED ORDER — MIDAZOLAM HCL 5 MG/5ML IJ SOLN
INTRAMUSCULAR | Status: DC | PRN
  Administered 2017-03-17: 2 mg via INTRAVENOUS

## 2017-03-17 MED ORDER — BUPIVACAINE-EPINEPHRINE 0.25% -1:200000 IJ SOLN
INTRAMUSCULAR | Status: AC
  Filled 2017-03-17: qty 1

## 2017-03-17 MED ORDER — BUPIVACAINE-EPINEPHRINE (PF) 0.25% -1:200000 IJ SOLN
INTRAMUSCULAR | Status: AC
  Filled 2017-03-17: qty 30

## 2017-03-17 MED ORDER — BUPIVACAINE-EPINEPHRINE 0.25% -1:200000 IJ SOLN
INTRAMUSCULAR | Status: DC | PRN
  Administered 2017-03-17: 80 mL

## 2017-03-17 MED ORDER — DEXAMETHASONE SODIUM PHOSPHATE 10 MG/ML IJ SOLN
INTRAMUSCULAR | Status: AC
  Filled 2017-03-17: qty 1

## 2017-03-17 MED ORDER — OXYCODONE HCL 5 MG/5ML PO SOLN
5.0000 mg | Freq: Once | ORAL | Status: DC | PRN
  Filled 2017-03-17: qty 5

## 2017-03-17 MED ORDER — MENTHOL 3 MG MT LOZG: 1.0000 | LOZENGE | OROMUCOSAL | Status: DC | PRN

## 2017-03-17 MED ORDER — PROPOFOL 10 MG/ML IV BOLUS
INTRAVENOUS | Status: DC | PRN
  Administered 2017-03-17: 200 mg via INTRAVENOUS

## 2017-03-17 MED ORDER — DEXAMETHASONE SODIUM PHOSPHATE 10 MG/ML IJ SOLN
INTRAMUSCULAR | Status: DC | PRN
  Administered 2017-03-17: 5 mg via INTRAVENOUS

## 2017-03-17 MED ORDER — HYDROCORTISONE 2.5 % RE CREA: 1.0000 "application " | TOPICAL_CREAM | Freq: Four times a day (QID) | RECTAL | Status: DC | PRN

## 2017-03-17 MED ORDER — ACETAMINOPHEN 500 MG PO TABS
1000.0000 mg | ORAL_TABLET | Freq: Three times a day (TID) | ORAL | Status: DC
  Administered 2017-03-17 – 2017-03-21 (×11): 1000 mg via ORAL
  Filled 2017-03-17 (×11): qty 2

## 2017-03-17 SURGICAL SUPPLY — 26 items
BLADE SURG 15 STRL LF DISP TIS (BLADE) ×1 IMPLANT
BLADE SURG 15 STRL SS (BLADE) ×2
BNDG COHESIVE 6X5 TAN STRL LF (GAUZE/BANDAGES/DRESSINGS) ×1 IMPLANT
BNDG GAUZE ELAST 4 BULKY (GAUZE/BANDAGES/DRESSINGS) ×1 IMPLANT
BRIEF STRETCH FOR OB PAD LRG (UNDERPADS AND DIAPERS) IMPLANT
COVER SURGICAL LIGHT HANDLE (MISCELLANEOUS) ×2 IMPLANT
DRAPE LAPAROTOMY T 102X78X121 (DRAPES) ×2 IMPLANT
DRSG PAD ABDOMINAL 8X10 ST (GAUZE/BANDAGES/DRESSINGS) IMPLANT
ELECT PENCIL ROCKER SW 15FT (MISCELLANEOUS) ×2 IMPLANT
ELECT REM PT RETURN 15FT ADLT (MISCELLANEOUS) ×2 IMPLANT
GAUZE SPONGE 4X4 12PLY STRL (GAUZE/BANDAGES/DRESSINGS) IMPLANT
GAUZE SPONGE 4X4 16PLY XRAY LF (GAUZE/BANDAGES/DRESSINGS) ×2 IMPLANT
GLOVE ECLIPSE 8.0 STRL XLNG CF (GLOVE) ×2 IMPLANT
GLOVE INDICATOR 8.0 STRL GRN (GLOVE) ×2 IMPLANT
GOWN STRL REUS W/TWL XL LVL3 (GOWN DISPOSABLE) ×4 IMPLANT
KIT BASIN OR (CUSTOM PROCEDURE TRAY) ×2 IMPLANT
LUBRICANT JELLY K Y 4OZ (MISCELLANEOUS) ×2 IMPLANT
NEEDLE HYPO 22GX1.5 SAFETY (NEEDLE) ×2 IMPLANT
PACK BASIC VI WITH GOWN DISP (CUSTOM PROCEDURE TRAY) ×2 IMPLANT
PAD POSITIONING PINK XL (MISCELLANEOUS) ×1 IMPLANT
SPONGE LAP 18X18 X RAY DECT (DISPOSABLE) ×1 IMPLANT
SYR 20CC LL (SYRINGE) ×2 IMPLANT
SYR BULB IRRIGATION 50ML (SYRINGE) ×2 IMPLANT
TOWEL OR 17X26 10 PK STRL BLUE (TOWEL DISPOSABLE) ×2 IMPLANT
TOWEL OR NON WOVEN STRL DISP B (DISPOSABLE) ×2 IMPLANT
YANKAUER SUCT BULB TIP 10FT TU (MISCELLANEOUS) ×2 IMPLANT

## 2017-03-17 NOTE — Anesthesia Procedure Notes (Signed)
Procedure Name: LMA Insertion Date/Time: 03/17/2017 12:20 PM Performed by: Orest Dikes Pre-anesthesia Checklist: Patient identified, Emergency Drugs available, Suction available and Patient being monitored Patient Re-evaluated:Patient Re-evaluated prior to induction Oxygen Delivery Method: Circle system utilized Preoxygenation: Pre-oxygenation with 100% oxygen Induction Type: IV induction LMA: LMA inserted LMA Size: 4.0 Number of attempts: 1 Placement Confirmation: positive ETCO2 and breath sounds checked- equal and bilateral Tube secured with: Tape Dental Injury: Teeth and Oropharynx as per pre-operative assessment

## 2017-03-17 NOTE — Transfer of Care (Signed)
Immediate Anesthesia Transfer of Care Note  Patient: [name]  Procedure(s) Performed: Procedure(s): IRRIGATION AND DEBRIDEMENT RIGHT THIGH ABSCESS (Right)  Patient Location: PACU  Anesthesia Type:General  Level of Consciousness:  sedated, patient cooperative and responds to stimulation  Airway & Oxygen Therapy:Patient Spontanous Breathing and Patient connected to face mask oxgen  Post-op Assessment:  Report given to PACU RN and Post -op Vital signs reviewed and stable  Post vital signs:  Reviewed and stable  Last Vitals:  Vitals:   03/17/17 0603 03/17/17 1018  BP: 121/83 107/76  Pulse: 68 69  Resp: 16 18  Temp: 36.7 C 36.8 C    Complications: No apparent anesthesia complications

## 2017-03-17 NOTE — Anesthesia Preprocedure Evaluation (Signed)
Anesthesia Evaluation  Patient identified by MRN, date of birth, ID band Patient awake    Reviewed: Allergy & Precautions, H&P , NPO status , Patient's Chart, lab work & pertinent test results  History of Anesthesia Complications (+) PONV  Airway Mallampati: II   Neck ROM: full    Dental   Pulmonary shortness of breath, sleep apnea , Current Smoker,    breath sounds clear to auscultation       Cardiovascular hypertension,  Rhythm:regular Rate:Normal     Neuro/Psych PSYCHIATRIC DISORDERS Depression    GI/Hepatic   Endo/Other  diabetes, Type 2  Renal/GU      Musculoskeletal  (+) Arthritis ,   Abdominal   Peds  Hematology   Anesthesia Other Findings   Reproductive/Obstetrics                             Anesthesia Physical Anesthesia Plan  ASA: II  Anesthesia Plan: General   Post-op Pain Management:    Induction: Intravenous  PONV Risk Score and Plan: 4 or greater and Ondansetron, Dexamethasone, Propofol, Midazolam, Scopolamine patch - Pre-op and Treatment may vary due to age or medical condition  Airway Management Planned: LMA  Additional Equipment:   Intra-op Plan:   Post-operative Plan:   Informed Consent: I have reviewed the patients History and Physical, chart, labs and discussed the procedure including the risks, benefits and alternatives for the proposed anesthesia with the patient or authorized representative who has indicated his/her understanding and acceptance.     Plan Discussed with: CRNA, Anesthesiologist and Surgeon  Anesthesia Plan Comments:         Anesthesia Quick Evaluation

## 2017-03-17 NOTE — Progress Notes (Signed)
Pharmacy Antibiotic Note  [name] is a 46 y.o. female admitted on 03/14/2017 with cellulitis.  Pharmacy has been consulted for vancomycin dosing.  Please see pharmacist note from earlier today for further detail.  Vancomycin trough = 18 mcg/mL on 1g q12h dosing (VT 15-20 mcg/ml for deeper necrotic infection)  Plan: Continue Vancomycin 1g IV q12h.  Clance Boll, PharmD Pager: [phone] 03/17/2017

## 2017-03-17 NOTE — Op Note (Signed)
03/17/2017  1:05 PM  PATIENT:  [name]  46 y.o. 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  INDICATION: Smoking female with diabetes noncompliant due to inability to afford insulin.  Developed a blistery boil in inner right thigh.  Worsened.  Underwent incision and drainage attempt when she was up in [place].  Not much result.  Placed on oral antibiotics.  Progressed.  Worsen.  Admitted.  Some septic and leukocytosis resolved with IV antibiotics but patient more fluctuant and pain filled.  MRI suspicious for deeper collection.  I recommended operative incision and drainage.  Possible debridement.  The anatomy and physiology of skin abscesses was discussed. Pathophysiology of SQ abscess, possible progression to fasciitis & sepsis, etc discussed . I stressed good hygiene & wound care. Possible redebridement was discussed as well.   Possibility of recurrence was discussed. Risks, benefits, alternatives were discussed. I noted a good likelihood this will help address the problem. Risks of anesthesia and other risks discussed. Questions answered. The patient is does wish to proceed.   OR FINDINGS:   10 cm deep abscess.   Wound measures 15 cm by 10cm.  It is 5-10 cm  deep   DESCRIPTION:   Informed consent was confirmed. The patient received IV antibiotics. The patient underwent general anesthesia without any difficulty. The patient was positioned in low lithotomy. SCDs were active during the entire case. The area around the abscess was prepped and draped in a sterile fashion. A surgical timeout confirmed our plan.   I made an 18-gauge needle aspiration over the most fluctuant area of the mass.   Did have some mildly purulent fat aspirated but no definite deep pocket.   Does based on hemorrhoid exam is suspected she can have purulence deeper to what an 18-gauge needle could reach, I went ahead and did a 6 cm longitudinal proximal inner thigh incision over the central fluctuance.  Once I probed about 5 cm deep I got a gush of purple/gray/purulent fluid.  Directly swab those for culture.  I extended the incision distally and somewhat anteriorly to 15 cm to connect with another moderate pocket in the anterior mid thigh..  I broke up loculations subcutaneous tissues of the medial and anterior.  Came down towards the posterior thigh close to the border with the gluteus.  This seemed to be the area that had the most purulence and infection.  The fascia over this proximal posterior lateral thigh was brackish.  Some ischemia and necrosis that 90% the fascia viable.  Opened up the thigh fascial pockets along the medial and anterior aspects.  Muscle viable deep to it.  It did copious irrigation with saline and some antibiotic-soaked solution.  I sharply indurated out some necrotic and infected  fat in the deep subcutaneous tissues just on top of the medial thigh fascial compartment.  I assured hemostasis.  Did field block around the incision.  I packed the wound with two large 6"Kerlixes.   Sterile dressings applied.  Patient is being extubated go to recovery room.   We plan to continue IV antibiotics and begin wound care training tomorrow.  There is no family available at this  time.  We will try and discuss later.     Ardeth Sportsman, M.D., F.A.C.S. Gastrointestinal and Minimally Invasive Surgery [facility] [address], [address] [place], [place] [address] [phone] Main / Paging

## 2017-03-17 NOTE — Progress Notes (Signed)
PROGRESS NOTE    [name]  RXV:[id number] DOB: 10-Nov-1970 DOA: 03/14/2017 PCP: Patient, No Pcp Per     Brief Narrative:  [name] is a 46 y.o. female with medical history significant for insulin-dependent diabetes mellitus, not taking insulin due to prohibitive cost, now presenting to the emergency department for evaluation of severe pain, swelling, and redness in the right groin. Patient reports that she had been in her usual state until approximately 03/11/2017 when she noted a painful lump near the right groin. Over the next day, this increased in size, significant surrounding erythema developed, and pain worsened. She was evaluated for this on 03/12/2017 in an emergency department in [place] where she underwent I&D with drainage of purulent material, and was started on clindamycin. She reports that over the ensuing 2 days, pain and swelling has worsened and erythema has extended down the medial right thigh. She was treated with IV antibiotics, and due to worsening pain, underwent MRI right thigh which showed soft tissue edema with area concerning for fat necrosis. General surgery was consulted for I&D and debridement.    Assessment & Plan:   Principal Problem:   Cellulitis Active Problems:   Uncontrolled type 2 diabetes mellitus with hyperosmolar nonketotic hyperglycemia (HCC)   HTN (hypertension)   OSA (obstructive sleep apnea)   Hyponatremia   Cellulitis of leg, right   Hyperglycemia   Right inner thigh cellulitis with underlying fat necrosis  -MRI right femur showed cellulitis of the upper medial right thigh with mild fasciitis involving the right upper gracilis muscle. Areas of non enhancement within the subcutaneous fat concerning for areas of necrosis. -[facility] microbiology, ED, and medical records for wound culture that was taken on 7/13 in the ED. Apparently, sample was not sent for culture at the time.  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 -Ha1c 15 -Continue lantus and SSI, neurontin -CM contacted for assistance with meds at discharge, consult diabetes coordinator  -Anticipated discharge meds; 70/30 relion and metformin (due to cost)  Hyponatremia -From hyperglycemia -Resolved with IVF and blood glucose control  HTN -Stable overall  -Continue lisinopril   GERD -Continue PPI  Depression -Continue prozac   DVT prophylaxis: Lovenox Code Status: Full Family Communication: No family at bedside Disposition Plan: Pending improvement   Consultants:   General Surgery  Procedures:   I&D, debridement planned 7/18  Antimicrobials:  Anti-infectives    Start     Dose/Rate Route Frequency Ordered Stop   03/16/17 1500  [MAR Hold]  piperacillin-tazobactam (ZOSYN) IVPB 3.375 g     (MAR Hold since 03/17/17 1057)   3.375 g 12.5 mL/hr over 240 Minutes Intravenous Every 8 hours 03/16/17 1437     03/15/17 1000  [MAR Hold]  vancomycin (VANCOCIN) IVPB 1000 mg/200 mL premix     (MAR Hold since 03/17/17 1057)   1,000 mg 200 mL/hr over 60 Minutes Intravenous Every 12 hours 03/14/17 1917     03/14/17 1845  ceFAZolin (ANCEF) IVPB 1 g/50 mL premix     1 g 100 mL/hr over 30 Minutes Intravenous  Once 03/14/17 1831 03/14/17 2010   03/14/17 1845  vancomycin (VANCOCIN) IVPB 1000 mg/200 mL premix     1,000 mg 200 mL/hr over 60 Minutes Intravenous  Once 03/14/17 1838 03/14/17 2111       Subjective: Patient feeling excited to undergo surgical procedure for relief of her right  thigh pain and swelling. She admits that the induration has gotten much harder. Denies fevers, had some sweats overnight, no chest pain, SOB, N/V/D/abdominal pain.    Objective: Vitals:   03/16/17 2053 03/17/17 0603 03/17/17 1018 03/17/17 1106  BP: 101/68 121/83 107/76   Pulse: 76 68 69   Resp: 16 16 18    Temp: 99.2  F (37.3 C) 98.1 F (36.7 C) 98.2 F (36.8 C)   TempSrc: Oral Oral Oral   SpO2: 100% 100% 100%   Weight:    92.1 kg (203 lb)  Height:    5\' 10"  (1.778 m)    Intake/Output Summary (Last 24 hours) at 03/17/17 1307 Last data filed at 03/17/17 1245  Gross per 24 hour  Intake          2480.75 ml  Output               25 ml  Net          2455.75 ml   Filed Weights   03/14/17 1852 03/15/17 1552 03/17/17 1106  Weight: 88.9 kg (196 lb) 92.1 kg (203 lb) 92.1 kg (203 lb)    Examination:  General exam: Appears calm and comfortable  Respiratory system: Clear to auscultation. Respiratory effort normal. Cardiovascular system: S1 & S2 heard, RRR. No JVD, murmurs, rubs, gallops or clicks. No pedal edema. Gastrointestinal system: Abdomen is nondistended, soft and nontender. No organomegaly or masses felt. Normal bowel sounds heard. Central nervous system: Alert and oriented. No focal neurological deficits. Extremities: Symmetric 5 x 5 power. Skin: +right inner thigh with induration, pain with palpation, outlined margin Psychiatry: Judgement and insight appear normal. Mood & affect appropriate.   Data Reviewed: I have personally reviewed following labs and imaging studies  CBC:  Recent Labs Lab 03/14/17 1724 03/14/17 1736 03/16/17 0402 03/17/17 0342  WBC 14.0*  --  10.4 9.7  NEUTROABS 10.5*  --   --   --   HGB 13.8 14.6 12.4 12.1  HCT 40.1 43.0 36.5 35.5*  MCV 85.9  --  86.7 85.5  PLT 164  --  163 177   Basic Metabolic Panel:  Recent Labs Lab 03/15/17 0003 03/15/17 0321 03/15/17 0759 03/16/17 0402 03/17/17 0342  NA 133* 133* 135 135 136  K 3.5 4.5 3.8 4.0 4.6  CL 102 101 102 102 103  CO2 26 24 26 27 26   GLUCOSE 200* 197* 135* 249* 328*  BUN 20 20 19 15 14   CREATININE 0.79 0.82 0.77 0.80 0.94  CALCIUM 8.3* 8.2* 8.5* 8.6* 8.6*   GFR: Estimated Creatinine Clearance: 90 mL/min (by C-G formula based on SCr of 0.94 mg/dL). Liver Function Tests: No results for input(s):  AST, ALT, ALKPHOS, BILITOT, PROT, ALBUMIN in the last 168 hours. No results for input(s): LIPASE, AMYLASE in the last 168 hours. No results for input(s): AMMONIA in the last 168 hours. Coagulation Profile: No results for input(s): INR, PROTIME in the last 168 hours. Cardiac Enzymes: No results for input(s): CKTOTAL, CKMB, CKMBINDEX, TROPONINI in the last 168 hours. BNP (last 3 results) No results for input(s): PROBNP in the last 8760 hours. HbA1C:  Recent Labs  03/15/17 0759  HGBA1C 15.0*   CBG:  Recent Labs Lab 03/16/17 1206 03/16/17 1733 03/16/17 2056 03/17/17 0819 03/17/17 1127  GLUCAP 223* 149* 174* 261* 219*   Lipid Profile: No results for input(s): CHOL, HDL, LDLCALC, TRIG, CHOLHDL, LDLDIRECT in the last 72 hours. Thyroid Function Tests: No results for input(s): TSH, T4TOTAL, FREET4, T3FREE,  THYROIDAB in the last 72 hours. Anemia Panel: No results for input(s): VITAMINB12, FOLATE, FERRITIN, TIBC, IRON, RETICCTPCT in the last 72 hours. Sepsis Labs:  Recent Labs Lab 03/14/17 1736  LATICACIDVEN 1.09    Recent Results (from the past 240 hour(s))  MRSA PCR Screening     Status: None   Collection Time: 03/14/17  8:25 PM  Result Value Ref Range Status   MRSA by PCR NEGATIVE NEGATIVE Final    Comment:        The GeneXpert MRSA Assay (FDA approved for NASAL specimens only), is one component of a comprehensive MRSA colonization surveillance program. It is not intended to diagnose MRSA infection nor to guide or monitor treatment for MRSA infections.   Surgical pcr screen     Status: None   Collection Time: 03/16/17  6:18 PM  Result Value Ref Range Status   MRSA, PCR NEGATIVE NEGATIVE Final   Staphylococcus aureus NEGATIVE NEGATIVE Final    Comment:        The Xpert SA Assay (FDA approved for NASAL specimens in patients over 81 years of age), is one component of a comprehensive surveillance program.  Test performance has been validated by [facility]  for patients greater than or equal to 86 year old. It is not intended to diagnose infection nor to guide or monitor treatment.        Radiology Studies: Mr Femur Right W Wo Contrast  Result Date: 03/16/2017 CLINICAL DATA:  Abscess of right thigh, right sided inner upper thigh. Redness swelling and heat. eval for extent of infection. Pt unable to keep legs together due to pain. EXAM: MRI OF THE RIGHT FEMUR WITHOUT AND WITH CONTRAST TECHNIQUE: Multiplanar, multisequence MR imaging of the left femur was performed both before and after administration of intravenous contrast. CONTRAST:  31mL MULTIHANCE GADOBENATE DIMEGLUMINE 529 MG/ML IV SOLN COMPARISON:  None. FINDINGS: Bones/Joint/Cartilage No marrow signal abnormality. No fracture or dislocation. Normal alignment. No joint effusion. Ligaments Collateral ligaments are intact. Muscles and Tendons Soft tissue edema in the of medial upper right thigh with enhancement on postcontrast imaging. Areas of non enhancement within the subcutaneous fat concerning for areas of necrosis measuring approximately 5.2 x 2.6 x 5.7 cm. Perifascial edema surrounding the superior aspect of the right gracilis muscle. Mild edema in the superior aspect of the right gracilis muscle. No intramuscular fluid collection or hematoma. Remainder the muscles are normal in signal.  No muscle atrophy. Soft tissue No other fluid collection or hematoma.  No soft tissue mass. IMPRESSION: Cellulitis of the upper medial right thigh with mild fasciitis involving the right upper gracilis muscle. Mild muscle edema in the upper right gracilis muscle likely reactive versus less likely secondary to mild myositis. Areas of non enhancement within the subcutaneous fat concerning for areas of necrosis. Electronically Signed   By: Elige Ko   On: 03/16/2017 12:27      Scheduled Meds: . [MAR Hold] aspirin EC  325 mg Oral Daily  . [MAR Hold] enoxaparin (LOVENOX) injection  40 mg Subcutaneous Q24H  .  [MAR Hold] FLUoxetine  40 mg Oral Daily  . [MAR Hold] insulin aspart  0-15 Units Subcutaneous TID WC  . [MAR Hold] insulin aspart  0-5 Units Subcutaneous QHS  . [MAR Hold] insulin aspart  4 Units Subcutaneous TID WC  . [MAR Hold] insulin glargine  20 Units Subcutaneous Daily  . [MAR Hold] lisinopril  20 mg Oral Daily  . nicotine  14 mg Transdermal Daily  . [  MAR Hold] pantoprazole  40 mg Oral Daily   Continuous Infusions: . sodium chloride 75 mL/hr at 03/16/17 2216  . lactated ringers    . [MAR Hold] piperacillin-tazobactam (ZOSYN)  IV Stopped (03/17/17 0941)  . [MAR Hold] vancomycin 1,000 mg (03/17/17 1012)     LOS: 3 days    Time spent: 45 minutes   Noralee Stain, DO [facility] [email or web address] Password [email or web address] 03/17/2017, 1:07 PM

## 2017-03-17 NOTE — Progress Notes (Signed)
Pharmacy Antibiotic Note  [name] is a 46 y.o. female admitted on 03/14/2017 with cellulitis.  Pharmacy has been consulted for vancomycin dosing.  PMH is  significant for insulin dependent DM, HTNwho presents to the Emergency Department complaining of progressively worsening abscess to right inner groin/thigh x 3 days. Friday she was seen at [facility] ED in [place] for this, where she was diagnosed with an abscess, an I&D was preformed and she was put on clindamycin. She states at the time of I&D the redness was ~8cm. The I&D expelled purulent drainage.She states she has not missed a dose. Since being discharge the patient notes increase pain and spreading redness of the inner right thigh/groin. Reports compliance to clindamycin  Per Surgery, add Zosyn 7/17  Plan:  Continue Vancomycin 1000  IV every 12 hours.  Goal trough 10-15 mcg/mL.  Continue Zosyn 3.375 g IV every 8 hrs by 4-hr infusion  Monitor clinical course, renal function, cultures as available  Will check vanc trough tonight prior to 6th dose   Height: 5\' 10"  (177.8 cm) Weight: 203 lb (92.1 kg) IBW/kg (Calculated) : 68.5  Temp (24hrs), Avg:98.8 F (37.1 C), Min:98.1 F (36.7 C), Max:99.4 F (37.4 C)   Recent Labs Lab 03/14/17 1724  03/14/17 1736 03/15/17 0003 03/15/17 0321 03/15/17 0759 03/16/17 0402 03/17/17 0342  WBC 14.0*  --   --   --   --   --  10.4 9.7  CREATININE  --   < > 1.00 0.79 0.82 0.77 0.80 0.94  LATICACIDVEN  --   --  1.09  --   --   --   --   --   < > = values in this interval not displayed.  Estimated Creatinine Clearance: 90 mL/min (by C-G formula based on SCr of 0.94 mg/dL).    Allergies  Allergen Reactions  . Codeine Itching  . Sulfa Antibiotics Hives    Antimicrobials this admission: 7/15 vancomycin >>  7/17 Zosyn >>  PTA clindamycin  PO started on 7/13  Dose adjustments this admission: ---  Microbiology results:  7/15 MRSA PCR: neg   Thank  you for allowing pharmacy to be a part of this patient's care.   8/15, PharmD, BCPS Pager [phone] 03/17/2017 9:44 AM

## 2017-03-17 NOTE — Progress Notes (Signed)
Dr. Toniann Fail notified of pt's [id number]. Order obtained for 8 units of novolog insuliln.

## 2017-03-17 NOTE — Progress Notes (Addendum)
Inpatient Diabetes Program Recommendations  AACE/ADA: New Consensus Statement on Inpatient Glycemic Control (2015)  Target Ranges:  Prepandial:   less than 140 mg/dL      Peak postprandial:   less than 180 mg/dL (1-2 hours)      Critically ill patients:  140 - 180 mg/dL   Lab Results  Component Value Date   GLUCAP 193 (H) 03/17/2017   HGBA1C 15.0 (H) 03/15/2017    Review of Glycemic Control  Diabetes history: DM2 Outpatient Diabetes medications: metformin 500 bid Current orders for Inpatient glycemic control: ON HOLD: Lantus 20 units QD, Novolog 0-15 units tidwc and hs + 4 units tidwc  Pt with HgbA1C of 15%. Will need to be discharged home on insulin. Had previously taken insulin, but no longer could afford. Will need affordable insulin as pt not insured.  Inpatient Diabetes Program Recommendations:    70/30 15 units bid - to begin 03/18/2017.  Pt not in room at present. Will speak with pt in am regarding her glycemic control and insulin.  Will follow. Thank you. Ailene Ards, RD, LDN, CDE Inpatient Diabetes Coordinator [phone]

## 2017-03-17 NOTE — Progress Notes (Signed)
[facility]  [address] [place], [place] [address] Phone: [phone]  FAX: [phone]      [name] [id number] Jan 05, 1971  CARE TEAM:  PCP: Patient, No Pcp Per  Outpatient Care Team: Patient Care Team: Patient, No Pcp Per as PCP - General (General Practice)  Inpatient Treatment Team: Treatment Team: Attending Provider: Shon Millet, DO; Registered Nurse: Lottie Dawson, RN; Registered Nurse: Consuela Mimes, RN; Technician: Lucas Mallow, NT; Consulting Physician: Edison Pace, Md, MD; Rounding Team: Redmond Baseman, MD; Registered Nurse: Eloise Harman D, RN   Problem List:   Principal Problem:   Cellulitis Active Problems:   Uncontrolled type 2 diabetes mellitus with hyperosmolar nonketotic hyperglycemia (HCC)   HTN (hypertension)   OSA (obstructive sleep apnea)   Hyponatremia   Cellulitis of leg, right   Hyperglycemia   Day of Surgery  03/14/2017 - 03/17/2017  Procedure(s): IRRIGATION AND DEBRIDEMENT RIGHT THIGH ABSCESS   Assessment  Worsening cellulitis and right inner thigh with probable deep abscess by MRI.  Severe pain suspicious as well.  Plan:  I&D right thigh abscess:  The anatomy and physiology of the region was discussed. The pathophysiology of subcutaneous abscess formation with progression to fasciitis & sepsis was discussed.  Need for incision, drainage, debridement discussed.  I stressed good hygiene & need for repeated wound care.  Possible redebridement / reoperation was discussed as well. Possibility of recurrence was discussed.   Risks of bleeding, infection, abscess, leak, injury to other organs, need for repair of tissues / organs, need for further treatment, heart attack, death, and other risks were discussed.  Benefits, alternatives were discussed. I noted a good likelihood this will help address the problem.  Questions answered.  The patient agrees to proceed.  -Patient hoping  she can just do the dressing changes by herself.  We will see if she can do that and follow closely.  I suspect it will be a challenge and she will need an alternative such as home health or family. -VTE prophylaxis- SCDs, etc -mobilize as tolerated to help recovery  15 minutes spent in review, evaluation, examination, counseling, and coordination of care.  More than 50% of that time was spent in counseling.  Adin Hector, M.D., F.A.C.S. Gastrointestinal and Minimally Invasive Surgery [facility] [address] [place], [place] [address] [phone] Main / Paging   03/17/2017    Subjective: (Chief complaint)  Still with pain and swelling but manageable.  Redness not receding.  Objective:  Vital signs:  Vitals:   03/16/17 1730 03/16/17 2053 03/17/17 0603 03/17/17 1018  BP: 96/62 101/68 121/83 107/76  Pulse: 77 76 68 69  Resp: _0 Temp: 98.9 F (37.2 C) 99.2 F (37.3 C) 98.1 F (36.7 C) 98.2 F (36.8 C)  TempSrc: Oral Oral Oral Oral  SpO2: 100% 100% 100% 100%  Weight:      Height:        Last BM Date: 03/14/17  Intake/Output   Yesterday:  07/17 0701 - 07/18 0700 In: 2720.8 [P.O.:480; I.V.:1690.8; IV Piggyback:550] Out: -  This shift:  No intake/output data recorded.  Bowel function:  Flatus: YES  BM:  YES  Drain: (No drain)   Physical Exam:  General: Pt awake/alert/oriented x4 in no acute distress Eyes: PERRL, normal EOM.  Sclera clear.  No icterus Neuro: CN II-XII intact w/o focal sensory/motor deficits. Lymph: No head/neck/groin lymphadenopathy Psych:  No delerium/psychosis/paranoia HENT: Normocephalic, Mucus membranes  moist.  No thrush Neck: Supple, No tracheal deviation Chest: No chest wall pain w good excursion CV:  Pulses intact.  Regular rhythm MS: Normal AROM mjr joints.  No obvious deformity  Abdomen: Soft.  Nondistended.  Nontender.  No evidence of peritonitis.  No incarcerated  hernias.  Ext:  Right inner thigh with erythema involving 50%.  Central fluctuance 10 x 6 cm region at more proximal inner thigh.  No deformity.  No mjr edema.  No cyanosis Skin: No petechiae / purpura  Results:   Labs: Results for orders placed or performed during the hospital encounter of 03/14/17 (from the past 48 hour(s))  Glucose, capillary     Status: Abnormal   Collection Time: 03/15/17 11:57 AM  Result Value Ref Range   Glucose-Capillary 189 (H) 65 - 99 mg/dL   Comment 1 Notify RN    Comment 2 Document in Chart   Glucose, capillary     Status: Abnormal   Collection Time: 03/15/17  5:09 PM  Result Value Ref Range   Glucose-Capillary 110 (H) 65 - 99 mg/dL   Comment 1 Notify RN    Comment 2 Document in Chart   Glucose, capillary     Status: Abnormal   Collection Time: 03/15/17  9:57 PM  Result Value Ref Range   Glucose-Capillary 189 (H) 65 - 99 mg/dL  Basic metabolic panel     Status: Abnormal   Collection Time: 03/16/17  4:02 AM  Result Value Ref Range   Sodium 135 135 - 145 mmol/L   Potassium 4.0 3.5 - 5.1 mmol/L   Chloride 102 101 - 111 mmol/L   CO2 27 22 - 32 mmol/L   Glucose, Bld 249 (H) 65 - 99 mg/dL   BUN 15 6 - 20 mg/dL   Creatinine, Ser 0.80 0.44 - 1.00 mg/dL   Calcium 8.6 (L) 8.9 - 10.3 mg/dL   GFR calc non Af Amer >60 >60 mL/min   GFR calc Af Amer >60 >60 mL/min    Comment: (NOTE) The eGFR has been calculated using the CKD EPI equation. This calculation has not been validated in all clinical situations. eGFR's persistently <60 mL/min signify possible Chronic Kidney Disease.    Anion gap 6 5 - 15  CBC     Status: None   Collection Time: 03/16/17  4:02 AM  Result Value Ref Range   WBC 10.4 4.0 - 10.5 K/uL   RBC 4.21 3.87 - 5.11 MIL/uL   Hemoglobin 12.4 12.0 - 15.0 g/dL   HCT 36.5 36.0 - 46.0 %   MCV 86.7 78.0 - 100.0 fL   MCH 29.5 26.0 - 34.0 pg   MCHC 34.0 30.0 - 36.0 g/dL   RDW 12.6 11.5 - 15.5 %   Platelets 163 150 - 400 K/uL  Glucose,  capillary     Status: Abnormal   Collection Time: 03/16/17  7:51 AM  Result Value Ref Range   Glucose-Capillary 224 (H) 65 - 99 mg/dL   Comment 1 Notify RN    Comment 2 Document in Chart   Glucose, capillary     Status: Abnormal   Collection Time: 03/16/17 12:06 PM  Result Value Ref Range   Glucose-Capillary 223 (H) 65 - 99 mg/dL   Comment 1 Notify RN    Comment 2 Document in Chart   Glucose, capillary     Status: Abnormal   Collection Time: 03/16/17  5:33 PM  Result Value Ref Range   Glucose-Capillary 149 (H) 65 - 99  mg/dL   Comment 1 Notify RN    Comment 2 Document in Chart   Surgical pcr screen     Status: None   Collection Time: 03/16/17  6:18 PM  Result Value Ref Range   MRSA, PCR NEGATIVE NEGATIVE   Staphylococcus aureus NEGATIVE NEGATIVE    Comment:        The Xpert SA Assay (FDA approved for NASAL specimens in patients over 31 years of age), is one component of a comprehensive surveillance program.  Test performance has been validated by [facility] for patients greater than or equal to 56 year old. It is not intended to diagnose infection nor to guide or monitor treatment.   Glucose, capillary     Status: Abnormal   Collection Time: 03/16/17  8:56 PM  Result Value Ref Range   Glucose-Capillary 174 (H) 65 - 99 mg/dL   Comment 1 Notify RN   CBC     Status: Abnormal   Collection Time: 03/17/17  3:42 AM  Result Value Ref Range   WBC 9.7 4.0 - 10.5 K/uL   RBC 4.15 3.87 - 5.11 MIL/uL   Hemoglobin 12.1 12.0 - 15.0 g/dL   HCT 35.5 (L) 36.0 - 46.0 %   MCV 85.5 78.0 - 100.0 fL   MCH 29.2 26.0 - 34.0 pg   MCHC 34.1 30.0 - 36.0 g/dL   RDW 12.3 11.5 - 15.5 %   Platelets 177 150 - 400 K/uL  Basic metabolic panel     Status: Abnormal   Collection Time: 03/17/17  3:42 AM  Result Value Ref Range   Sodium 136 135 - 145 mmol/L   Potassium 4.6 3.5 - 5.1 mmol/L   Chloride 103 101 - 111 mmol/L   CO2 26 22 - 32 mmol/L   Glucose, Bld 328 (H) 65 - 99 mg/dL   BUN 14 6 - 20  mg/dL   Creatinine, Ser 0.94 0.44 - 1.00 mg/dL   Calcium 8.6 (L) 8.9 - 10.3 mg/dL   GFR calc non Af Amer >60 >60 mL/min   GFR calc Af Amer >60 >60 mL/min    Comment: (NOTE) The eGFR has been calculated using the CKD EPI equation. This calculation has not been validated in all clinical situations. eGFR's persistently <60 mL/min signify possible Chronic Kidney Disease.    Anion gap 7 5 - 15  Glucose, capillary     Status: Abnormal   Collection Time: 03/17/17  8:19 AM  Result Value Ref Range   Glucose-Capillary 261 (H) 65 - 99 mg/dL   Comment 1 Notify RN    Comment 2 Document in Chart     Imaging / Studies: Mr Femur Right W Wo Contrast  Result Date: 03/16/2017 CLINICAL DATA:  Abscess of right thigh, right sided inner upper thigh. Redness swelling and heat. eval for extent of infection. Pt unable to keep legs together due to pain. EXAM: MRI OF THE RIGHT FEMUR WITHOUT AND WITH CONTRAST TECHNIQUE: Multiplanar, multisequence MR imaging of the left femur was performed both before and after administration of intravenous contrast. CONTRAST:  32m MULTIHANCE GADOBENATE DIMEGLUMINE 529 MG/ML IV SOLN COMPARISON:  None. FINDINGS: Bones/Joint/Cartilage No marrow signal abnormality. No fracture or dislocation. Normal alignment. No joint effusion. Ligaments Collateral ligaments are intact. Muscles and Tendons Soft tissue edema in the of medial upper right thigh with enhancement on postcontrast imaging. Areas of non enhancement within the subcutaneous fat concerning for areas of necrosis measuring approximately 5.2 x 2.6 x 5.7 cm. Perifascial edema  surrounding the superior aspect of the right gracilis muscle. Mild edema in the superior aspect of the right gracilis muscle. No intramuscular fluid collection or hematoma. Remainder the muscles are normal in signal.  No muscle atrophy. Soft tissue No other fluid collection or hematoma.  No soft tissue mass. IMPRESSION: Cellulitis of the upper medial right thigh with  mild fasciitis involving the right upper gracilis muscle. Mild muscle edema in the upper right gracilis muscle likely reactive versus less likely secondary to mild myositis. Areas of non enhancement within the subcutaneous fat concerning for areas of necrosis. Electronically Signed   By: Kathreen Devoid   On: 03/16/2017 12:27    Medications / Allergies: per chart  Antibiotics: Anti-infectives    Start     Dose/Rate Route Frequency Ordered Stop   03/16/17 1500  [MAR Hold]  piperacillin-tazobactam (ZOSYN) IVPB 3.375 g     (MAR Hold since 03/17/17 1057)   3.375 g 12.5 mL/hr over 240 Minutes Intravenous Every 8 hours 03/16/17 1437     03/15/17 1000  [MAR Hold]  vancomycin (VANCOCIN) IVPB 1000 mg/200 mL premix     (MAR Hold since 03/17/17 1057)   1,000 mg 200 mL/hr over 60 Minutes Intravenous Every 12 hours 03/14/17 1917     03/14/17 1845  ceFAZolin (ANCEF) IVPB 1 g/50 mL premix     1 g 100 mL/hr over 30 Minutes Intravenous  Once 03/14/17 1831 03/14/17 2010   03/14/17 1845  vancomycin (VANCOCIN) IVPB 1000 mg/200 mL premix     1,000 mg 200 mL/hr over 60 Minutes Intravenous  Once 03/14/17 1838 03/14/17 2111        Note: Portions of this report may have been transcribed using voice recognition software. Every effort was made to ensure accuracy; however, inadvertent computerized transcription errors may be present.   Any transcriptional errors that result from this process are unintentional.     Adin Hector, M.D., F.A.C.S. Gastrointestinal and Minimally Invasive Surgery [facility] [address], [place], [place] [address] [phone] Main / Paging   03/17/2017

## 2017-03-18 DIAGNOSIS — M726 Necrotizing fasciitis: Secondary | ICD-10-CM

## 2017-03-18 LAB — CBC WITH DIFFERENTIAL/PLATELET
Basophils Absolute: 0 10*3/uL (ref 0.0–0.1)
Basophils Relative: 0 %
Eosinophils Absolute: 0 10*3/uL (ref 0.0–0.7)
Eosinophils Relative: 0 %
HCT: 34.6 % — ABNORMAL LOW (ref 36.0–46.0)
Hemoglobin: 11.4 g/dL — ABNORMAL LOW (ref 12.0–15.0)
Lymphocytes Relative: 18 %
Lymphs Abs: 2 10*3/uL (ref 0.7–4.0)
MCH: 28.8 pg (ref 26.0–34.0)
MCHC: 32.9 g/dL (ref 30.0–36.0)
MCV: 87.4 fL (ref 78.0–100.0)
Monocytes Absolute: 0.8 10*3/uL (ref 0.1–1.0)
Monocytes Relative: 7 %
Neutro Abs: 8.4 10*3/uL — ABNORMAL HIGH (ref 1.7–7.7)
Neutrophils Relative %: 75 %
Platelets: 222 10*3/uL (ref 150–400)
RBC: 3.96 MIL/uL (ref 3.87–5.11)
RDW: 12.4 % (ref 11.5–15.5)
WBC: 11.1 10*3/uL — ABNORMAL HIGH (ref 4.0–10.5)

## 2017-03-18 LAB — GLUCOSE, CAPILLARY
Glucose-Capillary: 323 mg/dL — ABNORMAL HIGH (ref 65–99)
Glucose-Capillary: 155 mg/dL — ABNORMAL HIGH (ref 65–99)
Glucose-Capillary: 148 mg/dL — ABNORMAL HIGH (ref 65–99)
Glucose-Capillary: 121 mg/dL — ABNORMAL HIGH (ref 65–99)

## 2017-03-18 LAB — BASIC METABOLIC PANEL
Anion gap: 9 (ref 5–15)
BUN: 14 mg/dL (ref 6–20)
CO2: 25 mmol/L (ref 22–32)
Calcium: 8.5 mg/dL — ABNORMAL LOW (ref 8.9–10.3)
Chloride: 101 mmol/L (ref 101–111)
Creatinine, Ser: 1.05 mg/dL — ABNORMAL HIGH (ref 0.44–1.00)
GFR calc Af Amer: >60 mL/min (ref >60)
GFR calc non Af Amer: >60 mL/min (ref >60)
Glucose, Bld: 299 mg/dL — ABNORMAL HIGH (ref 65–99)
Potassium: 4.3 mmol/L (ref 3.5–5.1)
Sodium: 135 mmol/L (ref 135–145)

## 2017-03-18 MED ORDER — OXYCODONE HCL 5 MG PO TABS
5.0000 mg | ORAL_TABLET | ORAL | Status: DC | PRN
  Administered 2017-03-18 – 2017-03-21 (×7): 10 mg via ORAL
  Filled 2017-03-18 (×8): qty 2

## 2017-03-18 MED ORDER — INSULIN ASPART 100 UNIT/ML ~~LOC~~ SOLN
0.0000 [IU] | Freq: Three times a day (TID) | SUBCUTANEOUS | Status: DC
  Administered 2017-03-18: 3 [IU] via SUBCUTANEOUS
  Administered 2017-03-19 – 2017-03-20 (×2): 4 [IU] via SUBCUTANEOUS
  Administered 2017-03-20: 7 [IU] via SUBCUTANEOUS
  Administered 2017-03-20: 3 [IU] via SUBCUTANEOUS
  Administered 2017-03-21: 4 [IU] via SUBCUTANEOUS

## 2017-03-18 MED ORDER — INSULIN ASPART PROT & ASPART (70-30 MIX) 100 UNIT/ML ~~LOC~~ SUSP
15.0000 [IU] | Freq: Two times a day (BID) | SUBCUTANEOUS | Status: DC
  Administered 2017-03-18: 15 [IU] via SUBCUTANEOUS
  Filled 2017-03-18: qty 10

## 2017-03-18 MED ORDER — INSULIN ASPART PROT & ASPART (70-30 MIX) 100 UNIT/ML ~~LOC~~ SUSP
25.0000 [IU] | Freq: Two times a day (BID) | SUBCUTANEOUS | Status: DC
  Administered 2017-03-18 – 2017-03-21 (×6): 25 [IU] via SUBCUTANEOUS
  Filled 2017-03-18: qty 10

## 2017-03-18 MED ORDER — ALPRAZOLAM 1 MG PO TABS
1.0000 mg | ORAL_TABLET | Freq: Two times a day (BID) | ORAL | Status: DC | PRN
  Administered 2017-03-18 – 2017-03-20 (×4): 1 mg via ORAL
  Filled 2017-03-18 (×4): qty 1

## 2017-03-18 MED ORDER — MORPHINE SULFATE (PF) 2 MG/ML IV SOLN
1.0000 mg | INTRAVENOUS | Status: DC | PRN
  Administered 2017-03-18 – 2017-03-20 (×6): 1 mg via INTRAVENOUS
  Filled 2017-03-18 (×6): qty 1

## 2017-03-18 NOTE — Progress Notes (Addendum)
PROGRESS NOTE    [name]  IDP:[id number] DOB: November 16, 1970 DOA: 03/14/2017 PCP: Patient, No Pcp Per     Brief Narrative:  [name] is a 46 y.o. female with medical history significant for insulin-dependent diabetes mellitus, not taking insulin due to prohibitive cost, now presenting to the emergency department for evaluation of severe pain, swelling, and redness in the right groin. Patient reports that she had been in her usual state until approximately 03/11/2017 when she noted a painful lump near the right groin. Over the next day, this increased in size, significant surrounding erythema developed, and pain worsened. She was evaluated for this on 03/12/2017 in an emergency department in [place] where she underwent I&D with drainage of purulent material, and was started on clindamycin. She reports that over the ensuing 2 days, pain and swelling has worsened and erythema has extended down the medial right thigh. She was treated with IV antibiotics, and due to worsening pain, underwent MRI right thigh which showed soft tissue edema with area concerning for fat necrosis. General surgery was consulted for I&D and debridement.    Assessment & Plan:   Principal Problem:   Necrotizing fasciitis right thigh s/p I&D 03/17/2017 Active Problems:   Uncontrolled type 2 diabetes mellitus with hyperosmolar nonketotic hyperglycemia (HCC)   HTN (hypertension)   OSA (obstructive sleep apnea)   Cellulitis   Hyponatremia   Cellulitis of leg, right   Hyperglycemia   Right thigh necrotizing fasciitis  -MRI right femur showed cellulitis of the upper medial right thigh with mild fasciitis involving the right upper gracilis muscle. Areas of non enhancement within the subcutaneous fat concerning for areas of necrosis. -[facility] microbiology, ED, and medical records for wound culture that was taken on 7/13 in the ED. Apparently, sample was not sent for culture at  the time.  -Continue vanco, zosyn -S/p I&D with debridement with Dr. Michaell Cowing 7/18. 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 -Ha1c 15 -Continue Novolog mix 70/30 25u BID and Novolog SSI, neurontin -CM contacted for assistance with meds at discharge -Diabetes coordinator following   Hyponatremia -From hyperglycemia -Resolved with IVF and blood glucose control  HTN -Stable overall  -Continue lisinopril   GERD -Continue PPI  Depression -Continue prozac   DVT prophylaxis: Lovenox Code Status: Full Family Communication: No family at bedside Disposition Plan: Pending improvement   Consultants:   General Surgery  Procedures:   I&D, debridement 7/18  Antimicrobials:  Anti-infectives    Start     Dose/Rate Route Frequency Ordered Stop   03/16/17 1500  piperacillin-tazobactam (ZOSYN) IVPB 3.375 g     3.375 g 12.5 mL/hr over 240 Minutes Intravenous Every 8 hours 03/16/17 1437     03/15/17 1000  vancomycin (VANCOCIN) IVPB 1000 mg/200 mL premix     1,000 mg 200 mL/hr over 60 Minutes Intravenous Every 12 hours 03/14/17 1917     03/14/17 1845  ceFAZolin (ANCEF) IVPB 1 g/50 mL premix     1 g 100 mL/hr over 30 Minutes Intravenous  Once 03/14/17 1831 03/14/17 2010   03/14/17 1845  vancomycin (VANCOCIN) IVPB 1000 mg/200 mL premix     1,000 mg 200 mL/hr over 60 Minutes Intravenous  Once 03/14/17 1838 03/14/17 2111       Subjective: Patient feeling anxious, overwhelmed and tearful this morning. States that she likely would not be able to  do twice daily wound changes at home and does not have adequate home support who can help her. She is asking about wound vac. She states that her right thigh pain is very severe, especially when the dressing is wet. She is very apprehensive about showering as well.   Objective: Vitals:   03/17/17 1402 03/17/17 1730  03/17/17 2120 03/18/17 0549  BP: 110/71 104/65 112/76 108/64  Pulse: 77 74 77 69  Resp: 15 16 20 18   Temp: 98.4 F (36.9 C) 98 F (36.7 C) 98.4 F (36.9 C) 98.6 F (37 C)  TempSrc:  Oral Oral Oral  SpO2: 100% 100% 99% 99%  Weight:      Height:        Intake/Output Summary (Last 24 hours) at 03/18/17 1108 Last data filed at 03/18/17 0905  Gross per 24 hour  Intake             2850 ml  Output             1476 ml  Net             1374 ml   Filed Weights   03/14/17 1852 03/15/17 1552 03/17/17 1106  Weight: 88.9 kg (196 lb) 92.1 kg (203 lb) 92.1 kg (203 lb)    Examination:  General exam: Appears anxious, tearful, no acute distress  Respiratory system: Clear to auscultation. Respiratory effort normal. Cardiovascular system: S1 & S2 heard, RRR. No JVD, murmurs, rubs, gallops or clicks. No pedal edema. Gastrointestinal system: Abdomen is nondistended, soft and nontender. No organomegaly or masses felt. Normal bowel sounds heard. Central nervous system: Alert and oriented. No focal neurological deficits. Extremities: Symmetric Skin: +right inner thigh wound packed and dressed  Psychiatry: Judgement and insight appear normal. Mood & affect appropriate.   Data Reviewed: I have personally reviewed following labs and imaging studies  CBC:  Recent Labs Lab 03/14/17 1724 03/14/17 1736 03/16/17 0402 03/17/17 0342 03/18/17 0356  WBC 14.0*  --  10.4 9.7 11.1*  NEUTROABS 10.5*  --   --   --  8.4*  HGB 13.8 14.6 12.4 12.1 11.4*  HCT 40.1 43.0 36.5 35.5* 34.6*  MCV 85.9  --  86.7 85.5 87.4  PLT 164  --  163 177 222   Basic Metabolic Panel:  Recent Labs Lab 03/15/17 0321 03/15/17 0759 03/16/17 0402 03/17/17 0342 03/18/17 0356  NA 133* 135 135 136 135  K 4.5 3.8 4.0 4.6 4.3  CL 101 102 102 103 101  CO2 24 26 27 26 25   GLUCOSE 197* 135* 249* 328* 299*  BUN 20 19 15 14 14   CREATININE 0.82 0.77 0.80 0.94 1.05*  CALCIUM 8.2* 8.5* 8.6* 8.6* 8.5*   GFR: Estimated  Creatinine Clearance: 80.6 mL/min (A) (by C-G formula based on SCr of 1.05 mg/dL (H)). Liver Function Tests: No results for input(s): AST, ALT, ALKPHOS, BILITOT, PROT, ALBUMIN in the last 168 hours. No results for input(s): LIPASE, AMYLASE in the last 168 hours. No results for input(s): AMMONIA in the last 168 hours. Coagulation Profile: No results for input(s): INR, PROTIME in the last 168 hours. Cardiac Enzymes: No results for input(s): CKTOTAL, CKMB, CKMBINDEX, TROPONINI in the last 168 hours. BNP (last 3 results) No results for input(s): PROBNP in the last 8760 hours. HbA1C: No results for input(s): HGBA1C in the last 72 hours. CBG:  Recent Labs Lab 03/17/17 1315 03/17/17 1519 03/17/17 1730 03/17/17 2218 03/18/17 0819  GLUCAP 193* 272* 324* 405* 323*  Lipid Profile: No results for input(s): CHOL, HDL, LDLCALC, TRIG, CHOLHDL, LDLDIRECT in the last 72 hours. Thyroid Function Tests: No results for input(s): TSH, T4TOTAL, FREET4, T3FREE, THYROIDAB in the last 72 hours. Anemia Panel: No results for input(s): VITAMINB12, FOLATE, FERRITIN, TIBC, IRON, RETICCTPCT in the last 72 hours. Sepsis Labs:  Recent Labs Lab 03/14/17 1736  LATICACIDVEN 1.09    Recent Results (from the past 240 hour(s))  MRSA PCR Screening     Status: None   Collection Time: 03/14/17  8:25 PM  Result Value Ref Range Status   MRSA by PCR NEGATIVE NEGATIVE Final    Comment:        The GeneXpert MRSA Assay (FDA approved for NASAL specimens only), is one component of a comprehensive MRSA colonization surveillance program. It is not intended to diagnose MRSA infection nor to guide or monitor treatment for MRSA infections.   Surgical pcr screen     Status: None   Collection Time: 03/16/17  6:18 PM  Result Value Ref Range Status   MRSA, PCR NEGATIVE NEGATIVE Final   Staphylococcus aureus NEGATIVE NEGATIVE Final    Comment:        The Xpert SA Assay (FDA approved for NASAL specimens in patients  over 62 years of age), is one component of a comprehensive surveillance program.  Test performance has been validated by [facility] for patients greater than or equal to 36 year old. It is not intended to diagnose infection nor to guide or monitor treatment.   Aerobic/Anaerobic Culture (surgical/deep wound)     Status: None (Preliminary result)   Collection Time: 03/17/17 12:55 PM  Result Value Ref Range Status   Specimen Description ABSCESS THIGH  Final   Special Requests NONE  Final   Gram Stain   Final    ABUNDANT WBC PRESENT, PREDOMINANTLY PMN RARE GRAM NEGATIVE RODS RARE GRAM POSITIVE RODS FEW GRAM VARIABLE ROD RARE GRAM POSITIVE COCCI IN PAIRS    Culture   Final    NO GROWTH < 24 HOURS Performed at [facility], [address] [place], [place] [address]    Report Status PENDING  Incomplete       Radiology Studies: Mr Femur Right W Wo Contrast  Result Date: 03/16/2017 CLINICAL DATA:  Abscess of right thigh, right sided inner upper thigh. Redness swelling and heat. eval for extent of infection. Pt unable to keep legs together due to pain. EXAM: MRI OF THE RIGHT FEMUR WITHOUT AND WITH CONTRAST TECHNIQUE: Multiplanar, multisequence MR imaging of the left femur was performed both before and after administration of intravenous contrast. CONTRAST:  43mL MULTIHANCE GADOBENATE DIMEGLUMINE 529 MG/ML IV SOLN COMPARISON:  None. FINDINGS: Bones/Joint/Cartilage No marrow signal abnormality. No fracture or dislocation. Normal alignment. No joint effusion. Ligaments Collateral ligaments are intact. Muscles and Tendons Soft tissue edema in the of medial upper right thigh with enhancement on postcontrast imaging. Areas of non enhancement within the subcutaneous fat concerning for areas of necrosis measuring approximately 5.2 x 2.6 x 5.7 cm. Perifascial edema surrounding the superior aspect of the right gracilis muscle. Mild edema in the superior aspect of the right gracilis muscle. No  intramuscular fluid collection or hematoma. Remainder the muscles are normal in signal.  No muscle atrophy. Soft tissue No other fluid collection or hematoma.  No soft tissue mass. IMPRESSION: Cellulitis of the upper medial right thigh with mild fasciitis involving the right upper gracilis muscle. Mild muscle edema in the upper right gracilis muscle likely reactive  versus less likely secondary to mild myositis. Areas of non enhancement within the subcutaneous fat concerning for areas of necrosis. Electronically Signed   By: Elige Ko   On: 03/16/2017 12:27      Scheduled Meds: . acetaminophen  1,000 mg Oral TID  . aspirin EC  325 mg Oral Daily  . enoxaparin (LOVENOX) injection  40 mg Subcutaneous Q24H  . FLUoxetine  40 mg Oral Daily  . gabapentin  300 mg Oral QHS  . insulin aspart  0-15 Units Subcutaneous TID WC  . insulin aspart  0-5 Units Subcutaneous QHS  . insulin aspart  4 Units Subcutaneous TID WC  . insulin aspart protamine- aspart  15 Units Subcutaneous BID WC  . lip balm  1 application Topical BID  . lisinopril  20 mg Oral Daily  . meloxicam  15 mg Oral Daily  . nicotine  14 mg Transdermal Daily  . pantoprazole  40 mg Oral Daily  . psyllium  1 packet Oral BID  . sodium chloride flush  3 mL Intravenous Q12H   Continuous Infusions: . sodium chloride    . lactated ringers    . methocarbamol (ROBAXIN)  IV    . piperacillin-tazobactam (ZOSYN)  IV 3.375 g (03/18/17 0556)  . vancomycin 1,000 mg (03/18/17 0929)     LOS: 4 days    Time spent: 30 minutes   Noralee Stain, DO [facility] [email or web address] Password [email or web address] 03/18/2017, 11:08 AM

## 2017-03-18 NOTE — Anesthesia Postprocedure Evaluation (Signed)
Anesthesia Post Note  Patient: [name]  Procedure(s) Performed: Procedure(s) (LRB): IRRIGATION AND DEBRIDEMENT RIGHT THIGH ABSCESS (Right)     Patient location during evaluation: PACU Anesthesia Type: General Level of consciousness: awake and alert and patient cooperative Pain management: pain level controlled Vital Signs Assessment: post-procedure vital signs reviewed and stable Respiratory status: spontaneous breathing and respiratory function stable Cardiovascular status: stable Anesthetic complications: no    Last Vitals:  Vitals:   03/17/17 2120 03/18/17 0549  BP: 112/76 108/64  Pulse: 77 69  Resp: 20 18  Temp: 36.9 C 37 C    Last Pain:  Vitals:   03/18/17 0942  TempSrc:   PainSc: 6                  Violetta Lavalle S

## 2017-03-18 NOTE — Progress Notes (Deleted)
Dr. Toniann Fail notified of pt's CBG-405. Orders obtained for 8 units of novolog insulin.

## 2017-03-18 NOTE — Progress Notes (Signed)
Spoke with pt in length concerning HH needs, PCP with [facility], [facility] care. There is a problem with pt not having anyone at home to help with her dressing changes. Explained to pt that [facility] will not be able to come into the home every day or every other day for dressing changes. Asked pt to check with her family and friends. Will check with [facility] to see if charity can be offered to pt. An appointment for PCP will be made when close to discharge. Pt may purchase her medications at [facility] center at a low fee.  Except pain medications, there are no programs to assist with pain meds. Will continue to follow for discharge needs.

## 2017-03-18 NOTE — Progress Notes (Addendum)
Inpatient Diabetes Program Recommendations  AACE/ADA: New Consensus Statement on Inpatient Glycemic Control (2015)  Target Ranges:  Prepandial:   less than 140 mg/dL      Peak postprandial:   less than 180 mg/dL (1-2 hours)      Critically ill patients:  140 - 180 mg/dL   Lab Results  Component Value Date   GLUCAP 323 (H) 03/18/2017   HGBA1C 15.0 (H) 03/15/2017    Review of Glycemic Control  Blood sugars > 180 mg/dL. Needs tight glycemic control for healing. Pt is eating 100% meals. Cannot use meal coverage insulin with 70/30, as meal coverage is built in.  Inpatient Diabetes Program Recommendations:    Increase 70/30 to 25 units bid D/C Novolog 4 units tidwc for meal coverage insulin  Increase Novolog to 0-20 units tidwc and hs  Long discussion with pt regarding her glycemic control. Discussed HgbA1C of 15% and importance of tight glucose control for healing. Pt has meter at home for glucose monitoring. Needs affordable insulin. Discussed ReliOn 70/30 insulin at [facility] for $24.99. Pt needs PCP follow-up and is currently working with case manager for appt.  Will follow closely.  Thank you. Ailene Ards, RD, LDN, CDE Inpatient Diabetes Coordinator [phone]

## 2017-03-18 NOTE — Progress Notes (Signed)
1 Day Post-Op    CC:  Right groin pain and swelling  Subjective: Doing well, concerned about home care and does not have help at home.  She also has issues with affording insulin.  Dressing just changed and repacked, she showed me a picture of it, and it looks good.  We will look at wound tomorrow.  Objective: Vital signs in last 24 hours: Temp:  [98 F (36.7 C)-98.6 F (37 C)] 98.6 F (37 C) (07/19 0549) Pulse Rate:  [69-84] 69 (07/19 0549) Resp:  [14-20] 18 (07/19 0549) BP: (104-113)/(59-76) 108/64 (07/19 0549) SpO2:  [95 %-100 %] 99 % (07/19 0549) Weight:  [92.1 kg (203 lb)] 92.1 kg (203 lb) (07/18 1106) Last BM Date: 03/14/17 360 PO 2550 IV Urine 951 Afebrile, VSS Glucose still 300-400 range Creatinine is up WBC 11.1    Intake/Output from previous day: 07/18 0701 - 07/19 0700 In: 2910 [P.O.:360; I.V.:2250; IV Piggyback:300] Out: 976 [Urine:951; Blood:25] Intake/Output this shift: Total I/O In: 240 [P.O.:240] Out: 500 [Urine:500]  General appearance: alert, cooperative and no distress Incision/Wound:  Dressing in place, just changed, will look at it tomorrow.  Lab Results:   Recent Labs  03/17/17 0342 03/18/17 0356  WBC 9.7 11.1*  HGB 12.1 11.4*  HCT 35.5* 34.6*  PLT 177 222    BMET  Recent Labs  03/17/17 0342 03/18/17 0356  NA 136 135  K 4.6 4.3  CL 103 101  CO2 26 25  GLUCOSE 328* 299*  BUN 14 14  CREATININE 0.94 1.05*  CALCIUM 8.6* 8.5*   PT/INR No results for input(s): LABPROT, INR in the last 72 hours.  No results for input(s): AST, ALT, ALKPHOS, BILITOT, PROT, ALBUMIN in the last 168 hours.   Lipase     Component Value Date/Time   LIPASE 194 (H) 11/19/2014 1304     Medications: . acetaminophen  1,000 mg Oral TID  . aspirin EC  325 mg Oral Daily  . enoxaparin (LOVENOX) injection  40 mg Subcutaneous Q24H  . FLUoxetine  40 mg Oral Daily  . gabapentin  300 mg Oral QHS  . insulin aspart  0-15 Units Subcutaneous TID WC  .  insulin aspart  0-5 Units Subcutaneous QHS  . insulin aspart  4 Units Subcutaneous TID WC  . insulin aspart protamine- aspart  15 Units Subcutaneous BID WC  . lip balm  1 application Topical BID  . lisinopril  20 mg Oral Daily  . meloxicam  15 mg Oral Daily  . nicotine  14 mg Transdermal Daily  . pantoprazole  40 mg Oral Daily  . psyllium  1 packet Oral BID  . sodium chloride flush  3 mL Intravenous Q12H   Anti-infectives    Start     Dose/Rate Route Frequency Ordered Stop   03/16/17 1500  piperacillin-tazobactam (ZOSYN) IVPB 3.375 g     3.375 g 12.5 mL/hr over 240 Minutes Intravenous Every 8 hours 03/16/17 1437     03/15/17 1000  vancomycin (VANCOCIN) IVPB 1000 mg/200 mL premix     1,000 mg 200 mL/hr over 60 Minutes Intravenous Every 12 hours 03/14/17 1917     03/14/17 1845  ceFAZolin (ANCEF) IVPB 1 g/50 mL premix     1 g 100 mL/hr over 30 Minutes Intravenous  Once 03/14/17 1831 03/14/17 2010   03/14/17 1845  vancomycin (VANCOCIN) IVPB 1000 mg/200 mL premix     1,000 mg 200 mL/hr over 60 Minutes Intravenous  Once 03/14/17 1838 03/14/17 2111  Specimen Description ABSCESS THIGH  03/17/17  Special Requests NONE   Gram Stain ABUNDANT WBC PRESENT, PREDOMINANTLY PMN  RARE GRAM NEGATIVE RODS  RARE GRAM POSITIVE RODS  FEW GRAM VARIABLE ROD  RARE GRAM POSITIVE COCCI IN PAIRS      Culture NO GROWTH < 24 HOURS       Assessment/Plan Right thigh soft tissue infection, probable early necrotizing fasciitis S/p IRRIGATION AND DEBRIDEMENT RIGHT THIGH ABSCESS, 03/17/17, Dr. Karie Soda Uncontrolled type II diabetes with neuropathy  A1C 15 Hyponatremia Hypertension GERD Depression FEN:  IV fluids/NPO ID:  Cefazolin x 1 7/15; Zosyn 7/17 day 3" ; Vancomycin 7/15 =>> day 5 DVT:  Lovenox    Plan:  Dressing just changed, will look at it tomorrow.  We can start having her shower tomorrow with wound open tomorrow.  She needs very strong diabetes control to heal this up.  Culture is  pending.    LOS: 4 days    [name] 03/18/2017 [phone]

## 2017-03-18 NOTE — Progress Notes (Signed)
CSW consulted to assist with meds at dc. CSW is unable to assist with this request. RNCM may be able to assist.  Cori Razor LCSW [phone]

## 2017-03-19 LAB — CBC WITH DIFFERENTIAL/PLATELET
Basophils Absolute: 0 10*3/uL (ref 0.0–0.1)
Basophils Relative: 0 %
Eosinophils Absolute: 0.1 10*3/uL (ref 0.0–0.7)
Eosinophils Relative: 1 %
HCT: 35.9 % — ABNORMAL LOW (ref 36.0–46.0)
Hemoglobin: 11.8 g/dL — ABNORMAL LOW (ref 12.0–15.0)
Lymphocytes Relative: 45 %
Lymphs Abs: 4.8 10*3/uL — ABNORMAL HIGH (ref 0.7–4.0)
MCH: 29.1 pg (ref 26.0–34.0)
MCHC: 32.9 g/dL (ref 30.0–36.0)
MCV: 88.6 fL (ref 78.0–100.0)
Monocytes Absolute: 0.7 10*3/uL (ref 0.1–1.0)
Monocytes Relative: 6 %
Neutro Abs: 5.1 10*3/uL (ref 1.7–7.7)
Neutrophils Relative %: 48 %
Platelets: 235 10*3/uL (ref 150–400)
RBC: 4.05 MIL/uL (ref 3.87–5.11)
RDW: 12.7 % (ref 11.5–15.5)
WBC: 10.7 10*3/uL — ABNORMAL HIGH (ref 4.0–10.5)

## 2017-03-19 LAB — GLUCOSE, CAPILLARY
Glucose-Capillary: 109 mg/dL — ABNORMAL HIGH (ref 65–99)
Glucose-Capillary: 80 mg/dL (ref 65–99)
Glucose-Capillary: 194 mg/dL — ABNORMAL HIGH (ref 65–99)
Glucose-Capillary: 151 mg/dL — ABNORMAL HIGH (ref 65–99)

## 2017-03-19 LAB — BASIC METABOLIC PANEL
Anion gap: 9 (ref 5–15)
BUN: 22 mg/dL — ABNORMAL HIGH (ref 6–20)
CO2: 26 mmol/L (ref 22–32)
Calcium: 8.9 mg/dL (ref 8.9–10.3)
Chloride: 102 mmol/L (ref 101–111)
Creatinine, Ser: 1.21 mg/dL — ABNORMAL HIGH (ref 0.44–1.00)
GFR calc Af Amer: >60 mL/min (ref >60)
GFR calc non Af Amer: 52 mL/min — ABNORMAL LOW (ref >60)
Glucose, Bld: 116 mg/dL — ABNORMAL HIGH (ref 65–99)
Potassium: 4.5 mmol/L (ref 3.5–5.1)
Sodium: 137 mmol/L (ref 135–145)

## 2017-03-19 MED ORDER — BISACODYL 10 MG RE SUPP: 10.0000 mg | Freq: Every day | RECTAL | Status: DC | PRN

## 2017-03-19 MED ORDER — SODIUM CHLORIDE 0.9 % IV SOLN
INTRAVENOUS | Status: DC
  Administered 2017-03-19: 900 mL via INTRAVENOUS

## 2017-03-19 NOTE — Progress Notes (Signed)
2 Days Post-Op    WV:PXTGG groin pain and swelling  Subjective: She looks pretty good.  Site is clean and will do fine with wet to dry dressing changes.  I have encouraged her to wash area freely if soiled with urine or from BM.  She got in the shower yesterday and is slowly getting use to site.  She is worried about the undermining and care at home.  I assured her it is making good progress.  Objective: Vital signs in last 24 hours: Temp:  [98 F (36.7 C)-98.3 F (36.8 C)] 98.3 F (36.8 C) (07/20 0441) Pulse Rate:  [62-66] 64 (07/20 0441) Resp:  [12-18] 16 (07/20 0441) BP: (88-124)/(61-66) 124/62 (07/20 0441) SpO2:  [98 %-99 %] 98 % (07/20 0441) Last BM Date: 03/14/17 240 PO recorded 500 IV 1600 urine No BM recorded Afebrile, VSS Creatinine still going up  1.21 today Glucose is much improved WBC stable   Specimen Description ABSCESS THIGH   03/17/17  Special Requests NONE   Gram Stain ABUNDANT WBC PRESENT, PREDOMINANTLY PMN  RARE GRAM NEGATIVE RODS  RARE GRAM POSITIVE RODS  FEW GRAM VARIABLE ROD  RARE GRAM POSITIVE COCCI IN PAIRS      Culture NO GROWTH < 24 HOURS  Performed at [facility], [address], [place], [place] [address]      Report Status PENDING    NO films today  MRI 03/16/16:  Muscles and Tendons Soft tissue edema in the of medial upper right thigh with enhancement on postcontrast imaging. Areas of non enhancement within the subcutaneous fat concerning for areas of necrosis measuring approximately 5.2 x 2.6 x 5.7 cm.  Intake/Output from previous day: 07/19 0701 - 07/20 0700 In: 740 [P.O.:240; IV Piggyback:500] Out: 1600 [Urine:1600] Intake/Output this shift: No intake/output data recorded.  General appearance: alert, cooperative and no distress site looks good, some necrosis one area,but this will come off with wet to dry dressings.    Lab Results:   Recent Labs  03/18/17 0356 03/19/17 0405  WBC 11.1* 10.7*  HGB 11.4* 11.8*  HCT  34.6* 35.9*  PLT 222 235    BMET  Recent Labs  03/18/17 0356 03/19/17 0405  NA 135 137  K 4.3 4.5  CL 101 102  CO2 25 26  GLUCOSE 299* 116*  BUN 14 22*  CREATININE 1.05* 1.21*  CALCIUM 8.5* 8.9   PT/INR No results for input(s): LABPROT, INR in the last 72 hours.  No results for input(s): AST, ALT, ALKPHOS, BILITOT, PROT, ALBUMIN in the last 168 hours.   Lipase     Component Value Date/Time   LIPASE 194 (H) 11/19/2014 1304     Medications: . acetaminophen  1,000 mg Oral TID  . aspirin EC  325 mg Oral Daily  . enoxaparin (LOVENOX) injection  40 mg Subcutaneous Q24H  . FLUoxetine  40 mg Oral Daily  . gabapentin  300 mg Oral QHS  . insulin aspart  0-20 Units Subcutaneous TID WC  . insulin aspart  0-5 Units Subcutaneous QHS  . insulin aspart protamine- aspart  25 Units Subcutaneous BID WC  . lip balm  1 application Topical BID  . lisinopril  20 mg Oral Daily  . meloxicam  15 mg Oral Daily  . nicotine  14 mg Transdermal Daily  . pantoprazole  40 mg Oral Daily  . psyllium  1 packet Oral BID  . sodium chloride flush  3 mL Intravenous Q12H   Anti-infectives  Start     Dose/Rate Route Frequency Ordered Stop   03/16/17 1500  piperacillin-tazobactam (ZOSYN) IVPB 3.375 g     3.375 g 12.5 mL/hr over 240 Minutes Intravenous Every 8 hours 03/16/17 1437     03/15/17 1000  vancomycin (VANCOCIN) IVPB 1000 mg/200 mL premix     1,000 mg 200 mL/hr over 60 Minutes Intravenous Every 12 hours 03/14/17 1917     03/14/17 1845  ceFAZolin (ANCEF) IVPB 1 g/50 mL premix     1 g 100 mL/hr over 30 Minutes Intravenous  Once 03/14/17 1831 03/14/17 2010   03/14/17 1845  vancomycin (VANCOCIN) IVPB 1000 mg/200 mL premix     1,000 mg 200 mL/hr over 60 Minutes Intravenous  Once 03/14/17 1838 03/14/17 2111      Assessment/Plan Right thigh soft tissue infection, probable early necrotizing fasciitis S/p IRRIGATION AND DEBRIDEMENT RIGHT THIGH ABSCESS, 03/17/17, Dr. Karie Soda Uncontrolled  type II diabetes with neuropathy A1C 15 Hyponatremia Hypertension GERD Depression FEN: IV fluids/NPO ID: Cefazolin x 1 7/15; Zosyn 7/17 day 4 ; Vancomycin 7/15 =>>day 6 DVT: Lovenox    Plan;  I have ask Pharmacy to hold on the Vancomycin till we have a final on the culture, but right now I don't think we need it.  Creatinine slowly creeping up.  I think when we get the dressing changes and the daily wound management fears are resolved she can go home from our standpoint.  We need to decide on antibiotics, but I think she may be able to go with just a short course at home.  Glucose control is big issue and is improving.     LOS: 5 days    [name] 03/19/2017 [phone]

## 2017-03-19 NOTE — Progress Notes (Signed)
PROGRESS NOTE    [name]  WUJ:[id number] DOB: 03/22/1971 DOA: 03/14/2017 PCP: Patient, No Pcp Per     Brief Narrative:  [name] is a 46 y.o. female with medical history significant for insulin-dependent diabetes mellitus, not taking insulin due to prohibitive cost, now presenting to the emergency department for evaluation of severe pain, swelling, and redness in the right groin. Patient reports that she had been in her usual state until approximately 03/11/2017 when she noted a painful lump near the right groin. Over the next day, this increased in size, significant surrounding erythema developed, and pain worsened. She was evaluated for this on 03/12/2017 in an emergency department in [place] where she underwent I&D with drainage of purulent material, and was started on clindamycin. She reports that over the ensuing 2 days, pain and swelling has worsened and erythema has extended down the medial right thigh. She was treated with IV antibiotics, and due to worsening pain, underwent MRI right thigh which showed soft tissue edema with area concerning for fat necrosis. General surgery was consulted for I&D and debridement and underwent procedure 7/18.   Assessment & Plan:   Principal Problem:   Necrotizing fasciitis right thigh s/p I&D 03/17/2017 Active Problems:   Uncontrolled type 2 diabetes mellitus with hyperosmolar nonketotic hyperglycemia (HCC)   HTN (hypertension)   OSA (obstructive sleep apnea)   Cellulitis   Hyponatremia   Cellulitis of leg, right   Hyperglycemia   Right thigh necrotizing fasciitis  -MRI right femur showed cellulitis of the upper medial right thigh with mild fasciitis involving the right upper gracilis muscle. Areas of non enhancement within the subcutaneous fat concerning for areas of necrosis. -[facility] microbiology, ED, and medical records for wound culture that was taken on 7/13 in the ED. Apparently, sample was  not sent for culture at the time.  -S/p I&D with debridement with Dr. Michaell Cowing 7/18. Culture obtained in OR, pending  -Wound care and dressing changes per general surgery. Patient unsure if she will have adequate support at home for BID dressing change.  -Pain control  -Continue zosyn  Uncontrolled hyperglycemia: type 2 diabetes with neuropathy -Ha1c 15 -Continue Novolog mix 70/30 25u BID and Novolog SSI, neurontin -CM contacted for assistance with meds at discharge -Diabetes coordinator following  -Blood sugar better today   AKI -Baseline Cr 0.82, now 1.21 -Start IVF. Vanco stopped   HTN -Stable overall  -Continue lisinopril   GERD -Continue PPI  Depression -Continue prozac, xanax prn anxiety while in hospital   Tobacco abuse -Nicotine patch, cessation counseling    DVT prophylaxis: Lovenox Code Status: Full Family Communication: No family at bedside Disposition Plan: Home when improved    Consultants:   General Surgery  Procedures:   I&D, debridement 7/18  Antimicrobials:  Anti-infectives    Start     Dose/Rate Route Frequency Ordered Stop   03/16/17 1500  piperacillin-tazobactam (ZOSYN) IVPB 3.375 g     3.375 g 12.5 mL/hr over 240 Minutes Intravenous Every 8 hours 03/16/17 1437     03/15/17 1000  vancomycin (VANCOCIN) IVPB 1000 mg/200 mL premix  Status:  Discontinued     1,000 mg 200 mL/hr over 60 Minutes Intravenous Every 12 hours 03/14/17 1917 03/19/17 0822   03/14/17 1845  ceFAZolin (ANCEF) IVPB 1 g/50 mL premix     1 g 100 mL/hr over 30 Minutes Intravenous  Once 03/14/17 1831 03/14/17 2010   03/14/17 1845  vancomycin (VANCOCIN) IVPB 1000 mg/200 mL  premix     1,000 mg 200 mL/hr over 60 Minutes Intravenous  Once 03/14/17 1838 03/14/17 2111       Subjective: No new complaints, continues to have pain in right thigh. Very apprehensive about going home as she cannot do dressing changes herself. She may have a friend who could help once a day. No BM  since Sunday but passing flatus, wants to try suppository. Ambulating well.   Objective: Vitals:   03/18/17 1440 03/18/17 2023 03/19/17 0441 03/19/17 0937  BP: 90/61 (!) 88/66 124/62 113/89  Pulse:  62 64   Resp:  12 16   Temp:  98 F (36.7 C) 98.3 F (36.8 C)   TempSrc:  Oral Oral   SpO2:  99% 98%   Weight:      Height:        Intake/Output Summary (Last 24 hours) at 03/19/17 1119 Last data filed at 03/19/17 0442  Gross per 24 hour  Intake              500 ml  Output             11 00 ml  Net             -600 ml   Filed Weights   03/14/17 1852 03/15/17 1552 03/17/17 1106  Weight: 88.9 kg (196 lb) 92.1 kg (203 lb) 92.1 kg (203 lb)    Examination:  General exam: Appears calm  Respiratory system: Clear to auscultation. Respiratory effort normal. Cardiovascular system: S1 & S2 heard, RRR. No JVD, murmurs, rubs, gallops or clicks. No pedal edema. Gastrointestinal system: Abdomen is nondistended, soft and nontender. No organomegaly or masses felt. Normal bowel sounds heard. Central nervous system: Alert and oriented. No focal neurological deficits. Extremities: Symmetric Skin: +right inner thigh wound packed and dressed  Psychiatry: Judgement and insight appear normal. Mood & affect appropriate.   Data Reviewed: I have personally reviewed following labs and imaging studies  CBC:  Recent Labs Lab 03/14/17 1724 03/14/17 1736 03/16/17 0402 03/17/17 0342 03/18/17 0356 03/19/17 0405  WBC 14.0*  --  10.4 9.7 11.1* 10.7*  NEUTROABS 10.5*  --   --   --  8.4* 5.1  HGB 13.8 14.6 12.4 12.1 11.4* 11.8*  HCT 40.1 43.0 36.5 35.5* 34.6* 35.9*  MCV 85.9  --  86.7 85.5 87.4 88.6  PLT 164  --  163 177 222 235   Basic Metabolic Panel:  Recent Labs Lab 03/15/17 0759 03/16/17 0402 03/17/17 0342 03/18/17 0356 03/19/17 0405  NA 135 135 136 135 137  K 3.8 4.0 4.6 4.3 4.5  CL 102 102 103 101 102  CO2 26 27 26 25 26   GLUCOSE 135* 249* 328* 299* 116*  BUN 19 15 14 14  22*    CREATININE 0.77 0.80 0.94 1.05* 1.21*  CALCIUM 8.5* 8.6* 8.6* 8.5* 8.9   GFR: Estimated Creatinine Clearance: 69.9 mL/min (A) (by C-G formula based on SCr of 1.21 mg/dL (H)). Liver Function Tests: No results for input(s): AST, ALT, ALKPHOS, BILITOT, PROT, ALBUMIN in the last 168 hours. No results for input(s): LIPASE, AMYLASE in the last 168 hours. No results for input(s): AMMONIA in the last 168 hours. Coagulation Profile: No results for input(s): INR, PROTIME in the last 168 hours. Cardiac Enzymes: No results for input(s): CKTOTAL, CKMB, CKMBINDEX, TROPONINI in the last 168 hours. BNP (last 3 results) No results for input(s): PROBNP in the last 8760 hours. HbA1C: No results for input(s): HGBA1C in the last 72  hours. CBG:  Recent Labs Lab 03/18/17 0819 03/18/17 1223 03/18/17 1704 03/18/17 2229 03/19/17 0733  GLUCAP 323* 155* 148* 121* 109*   Lipid Profile: No results for input(s): CHOL, HDL, LDLCALC, TRIG, CHOLHDL, LDLDIRECT in the last 72 hours. Thyroid Function Tests: No results for input(s): TSH, T4TOTAL, FREET4, T3FREE, THYROIDAB in the last 72 hours. Anemia Panel: No results for input(s): VITAMINB12, FOLATE, FERRITIN, TIBC, IRON, RETICCTPCT in the last 72 hours. Sepsis Labs:  Recent Labs Lab 03/14/17 1736  LATICACIDVEN 1.09    Recent Results (from the past 240 hour(s))  MRSA PCR Screening     Status: None   Collection Time: 03/14/17  8:25 PM  Result Value Ref Range Status   MRSA by PCR NEGATIVE NEGATIVE Final    Comment:        The GeneXpert MRSA Assay (FDA approved for NASAL specimens only), is one component of a comprehensive MRSA colonization surveillance program. It is not intended to diagnose MRSA infection nor to guide or monitor treatment for MRSA infections.   Surgical pcr screen     Status: None   Collection Time: 03/16/17  6:18 PM  Result Value Ref Range Status   MRSA, PCR NEGATIVE NEGATIVE Final   Staphylococcus aureus NEGATIVE NEGATIVE  Final    Comment:        The Xpert SA Assay (FDA approved for NASAL specimens in patients over 54 years of age), is one component of a comprehensive surveillance program.  Test performance has been validated by [facility] for patients greater than or equal to 48 year old. It is not intended to diagnose infection nor to guide or monitor treatment.   Aerobic/Anaerobic Culture (surgical/deep wound)     Status: None (Preliminary result)   Collection Time: 03/17/17 12:55 PM  Result Value Ref Range Status   Specimen Description ABSCESS THIGH  Final   Special Requests NONE  Final   Gram Stain   Final    ABUNDANT WBC PRESENT, PREDOMINANTLY PMN RARE GRAM NEGATIVE RODS RARE GRAM POSITIVE RODS FEW GRAM VARIABLE ROD RARE GRAM POSITIVE COCCI IN PAIRS    Culture   Final    CULTURE REINCUBATED FOR BETTER GROWTH Performed at [facility], [address], [place], [place] [address]    Report Status PENDING  Incomplete       Radiology Studies: No results found.    Scheduled Meds: . acetaminophen  1,000 mg Oral TID  . aspirin EC  325 mg Oral Daily  . enoxaparin (LOVENOX) injection  40 mg Subcutaneous Q24H  . FLUoxetine  40 mg Oral Daily  . gabapentin  300 mg Oral QHS  . insulin aspart  0-20 Units Subcutaneous TID WC  . insulin aspart  0-5 Units Subcutaneous QHS  . insulin aspart protamine- aspart  25 Units Subcutaneous BID WC  . lip balm  1 application Topical BID  . lisinopril  20 mg Oral Daily  . meloxicam  15 mg Oral Daily  . nicotine  14 mg Transdermal Daily  . pantoprazole  40 mg Oral Daily  . psyllium  1 packet Oral BID  . sodium chloride flush  3 mL Intravenous Q12H   Continuous Infusions: . sodium chloride    . sodium chloride    . lactated ringers    . methocarbamol (ROBAXIN)  IV    . piperacillin-tazobactam (ZOSYN)  IV Stopped (03/19/17 1049)     LOS: 5 days    Time spent: 30 minutes   Noralee Stain, DO  [facility] [email or web address] Password  [email or web address] 03/19/2017, 11:18 AM

## 2017-03-19 NOTE — Progress Notes (Addendum)
Pharmacy Antibiotic Note  [name] is a 46 y.o. female admitted on 03/14/2017 with cellulitis.  Pharmacy has been consulted for vancomycin dosing. Initial I&D performed in a hospital in [place], but no Cx taken. Per Surgery, add Zosyn 7/17  Today, 03/19/2017:  SCr continues to trend up  WBC normalizing  Afebrile  Plan:  After d/w Surgery PA, will hold Vancomycin d/t concern for renal injury and no clear signs of staph on Gm stain. Also more comfortable targeting lower VT given good source control after I&D, so patient will likely remain therapeutic for the next 24 hrs anyway.  Continue Zosyn 3.375 g IV every 8 hrs by 4-hr infusion  Monitor clinical course, renal function, cultures as available   Height: 5\' 10"  (177.8 cm) Weight: 203 lb (92.1 kg) IBW/kg (Calculated) : 68.5  Temp (24hrs), Avg:98.1 F (36.7 C), Min:98 F (36.7 C), Max:98.3 F (36.8 C)   Recent Labs Lab 03/14/17 1724 03/14/17 1736  03/15/17 0759 03/16/17 0402 03/17/17 0342 03/17/17 2106 03/18/17 0356 03/19/17 0405  WBC 14.0*  --   --   --  10.4 9.7  --  11.1* 10.7*  CREATININE  --  1.00  < > 0.77 0.80 0.94  --  1.05* 1.21*  LATICACIDVEN  --  1.09  --   --   --   --   --   --   --   VANCOTROUGH  --   --   --   --   --   --  18  --   --   < > = values in this interval not displayed.  Estimated Creatinine Clearance: 69.9 mL/min (A) (by C-G formula based on SCr of 1.21 mg/dL (H)).    Allergies  Allergen Reactions  . Codeine Itching  . Sulfa Antibiotics Hives    Antimicrobials this admission:  7/13 PTA Clinda >> 7/15 7/15 vancomycin >> 7/20 7/17  Zosyn >>   Dose adjustments this admission:  7/18: VT at 2100 = 18 on 1g q12; increasing goal range to 15-20 at this time d/t deep infection  Microbiology results:  7/7 staph PCR: neg 7/17 MRSA PCR: neg 7/18 Thigh abscess (deep): GNR, GPR, GVR, GPC in pairs, NGTD   Thank you for allowing pharmacy to be a part of this patient's care.  8/18, PharmD, BCPS Pager: [phone] 03/19/2017, 8:28 AM

## 2017-03-20 ENCOUNTER — Inpatient Hospital Stay (HOSPITAL_COMMUNITY): Payer: Self-pay

## 2017-03-20 LAB — GLUCOSE, CAPILLARY
Glucose-Capillary: 174 mg/dL — ABNORMAL HIGH (ref 65–99)
Glucose-Capillary: 227 mg/dL — ABNORMAL HIGH (ref 65–99)
Glucose-Capillary: 147 mg/dL — ABNORMAL HIGH (ref 65–99)
Glucose-Capillary: 123 mg/dL — ABNORMAL HIGH (ref 65–99)

## 2017-03-20 LAB — CBC WITH DIFFERENTIAL/PLATELET
Basophils Absolute: 0 10*3/uL (ref 0.0–0.1)
Basophils Relative: 0 %
Eosinophils Absolute: 0.1 10*3/uL (ref 0.0–0.7)
Eosinophils Relative: 1 %
HCT: 33.2 % — ABNORMAL LOW (ref 36.0–46.0)
Hemoglobin: 10.9 g/dL — ABNORMAL LOW (ref 12.0–15.0)
Lymphocytes Relative: 44 %
Lymphs Abs: 3.8 10*3/uL (ref 0.7–4.0)
MCH: 28.7 pg (ref 26.0–34.0)
MCHC: 32.8 g/dL (ref 30.0–36.0)
MCV: 87.4 fL (ref 78.0–100.0)
Monocytes Absolute: 0.5 10*3/uL (ref 0.1–1.0)
Monocytes Relative: 6 %
Neutro Abs: 4.3 10*3/uL (ref 1.7–7.7)
Neutrophils Relative %: 49 %
Platelets: 238 10*3/uL (ref 150–400)
RBC: 3.8 MIL/uL — ABNORMAL LOW (ref 3.87–5.11)
RDW: 12.8 % (ref 11.5–15.5)
WBC: 8.6 10*3/uL (ref 4.0–10.5)

## 2017-03-20 LAB — BASIC METABOLIC PANEL
Anion gap: 7 (ref 5–15)
BUN: 21 mg/dL — ABNORMAL HIGH (ref 6–20)
CO2: 27 mmol/L (ref 22–32)
Calcium: 8.6 mg/dL — ABNORMAL LOW (ref 8.9–10.3)
Chloride: 105 mmol/L (ref 101–111)
Creatinine, Ser: 1.3 mg/dL — ABNORMAL HIGH (ref 0.44–1.00)
GFR calc Af Amer: 55 mL/min — ABNORMAL LOW (ref >60)
GFR calc non Af Amer: 48 mL/min — ABNORMAL LOW (ref >60)
Glucose, Bld: 164 mg/dL — ABNORMAL HIGH (ref 65–99)
Potassium: 4.1 mmol/L (ref 3.5–5.1)
Sodium: 139 mmol/L (ref 135–145)

## 2017-03-20 MED ORDER — SODIUM CHLORIDE 0.9 % IV SOLN
INTRAVENOUS | Status: DC
  Administered 2017-03-20: 09:00:00 via INTRAVENOUS

## 2017-03-20 NOTE — Progress Notes (Signed)
PROGRESS NOTE    IMOGEAN CIAMPA  DDU:[id number] DOB: [birth year] DOA: 03/14/2017 PCP: Patient, No Pcp Per     Brief Narrative:  MICAIAH REMILLARD is a 46 y.o. female with medical history significant for insulin-dependent diabetes mellitus, not taking insulin due to prohibitive cost, now presenting to the emergency department for evaluation of severe pain, swelling, and redness in the right groin. Patient reports that she had been in her usual state until approximately 03/11/2017 when she noted a painful lump near the right groin. Over the next day, this increased in size, significant surrounding erythema developed, and pain worsened. She was evaluated for this on 03/12/2017 in an emergency department in [place] where she underwent I&D with drainage of purulent material, and was started on clindamycin. She reports that over the ensuing 2 days, pain and swelling has worsened and erythema has extended down the medial right thigh. She was treated with IV antibiotics, and due to worsening pain, underwent MRI right thigh which showed soft tissue edema with area concerning for fat necrosis. General surgery was consulted for I&D and debridement and underwent procedure 7/18.   Assessment & Plan:   Principal Problem:   Necrotizing fasciitis right thigh s/p I&D 03/17/2017 Active Problems:   Uncontrolled type 2 diabetes mellitus with hyperosmolar nonketotic hyperglycemia (HCC)   HTN (hypertension)   OSA (obstructive sleep apnea)   Cellulitis   Hyponatremia   Cellulitis of leg, right   Hyperglycemia   Right thigh necrotizing fasciitis  -MRI right femur showed cellulitis of the upper medial right thigh with mild fasciitis involving the right upper gracilis muscle. Areas of non enhancement within the subcutaneous fat concerning for areas of necrosis. -[facility] microbiology, ED, and medical records for wound culture that was taken on 7/13 in the ED. Apparently, sample was  not sent for culture at the time.  -S/p I&D with debridement with Dr. Michaell Cowing 7/18. Culture obtained in OR, pending  -Wound care and dressing changes per general surgery. Need to ensure she will have adequate help with this at home. 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 Vanco stopped 7/20, lisinopril held  -Continue IVF, trend BMP  RLQ abdominal pain -Unclear etiology, she states this has been intermittent since prior to surgery, but worse this morning. She is s/p appendectomy in 2013. Had 3 BM yesterday after suppository. Abdominal pain is achy in nature and feels better when she holds her belly   HTN -Stable overall  -Lisinopril - hold in setting of AKI   GERD -Continue PPI  Depression -Continue prozac, xanax prn anxiety while in hospital   Tobacco abuse -Nicotine patch, cessation counseling    DVT prophylaxis: Lovenox Code Status: Full Family Communication: No family at bedside Disposition Plan: Home soon with [facility] services    Consultants:   General Surgery  Procedures:   I&D, debridement 7/18  Antimicrobials:  Anti-infectives    Start     Dose/Rate Route Frequency Ordered Stop   03/16/17 1500  piperacillin-tazobactam (ZOSYN) IVPB 3.375 g     3.375 g 12.5 mL/hr  over 240 Minutes Intravenous Every 8 hours 03/16/17 1437     03/15/17 1000  vancomycin (VANCOCIN) IVPB 1000 mg/200 mL premix  Status:  Discontinued     1,000 mg 200 mL/hr over 60 Minutes Intravenous Every 12 hours 03/14/17 1917 03/19/17 0822    03/14/17 1845  ceFAZolin (ANCEF) IVPB 1 g/50 mL premix     1 g 100 mL/hr over 30 Minutes Intravenous  Once 03/14/17 1831 03/14/17 2010   03/14/17 1845  vancomycin (VANCOCIN) IVPB 1000 mg/200 mL premix     1,000 mg 200 mL/hr over 60 Minutes Intravenous  Once 03/14/17 1838 03/14/17 2111       Subjective: RLQ pain this morning. This has been intermittent in nature since prior to surgery, but worse this morning. It is achy in nature, better when she holds her pannus. Had 3 BM yesterday.   Objective: Vitals:   03/19/17 0937 03/19/17 1400 03/19/17 2021 03/20/17 0610  BP: 113/89 101/61 102/67 105/70  Pulse:  71 67 64  Resp:  16 16 18   Temp:  97.7 F (36.5 C) 98.3 F (36.8 C) 97.8 F (36.6 C)  TempSrc:  Oral Oral Oral  SpO2:  98% 98% 100%  Weight:      Height:        Intake/Output Summary (Last 24 hours) at 03/20/17 0746 Last data filed at 03/20/17 0300  Gross per 24 hour  Intake          2318.75 ml  Output             1600 ml  Net           718.75 ml   Filed Weights   03/14/17 1852 03/15/17 1552 03/17/17 1106  Weight: 88.9 kg (196 lb) 92.1 kg (203 lb) 92.1 kg (203 lb)    Examination:  General exam: Appears calm, comfortable  Respiratory system: Clear to auscultation. Respiratory effort normal. Cardiovascular system: S1 & S2 heard, RRR. No JVD, murmurs, rubs, gallops or clicks. No pedal edema. Gastrointestinal system: Abdomen is nondistended, soft and largely unremarkable abdominal exam, some soreness RLQ. No organomegaly or masses felt. Normal bowel sounds heard. Central nervous system: Alert and oriented. No focal neurological deficits. Extremities: Symmetric Skin: +right inner thigh wound dressed   Psychiatry: Judgement and insight appear normal. Mood & affect appropriate.   Data Reviewed: I have personally reviewed following labs and imaging studies  CBC:  Recent Labs Lab 03/14/17 1724  03/16/17 0402 03/17/17 0342 03/18/17 0356 03/19/17 0405 03/20/17 0334    WBC 14.0*  --  10.4 9.7 11.1* 10.7* 8.6  NEUTROABS 10.5*  --   --   --  8.4* 5.1 4.3  HGB 13.8  < > 12.4 12.1 11.4* 11.8* 10.9*  HCT 40.1  < > 36.5 35.5* 34.6* 35.9* 33.2*  MCV 85.9  --  86.7 85.5 87.4 88.6 87.4  PLT 164  --  163 177 222 235 238  < > = values in this interval not displayed. Basic Metabolic Panel:  Recent Labs Lab 03/16/17 0402 03/17/17 0342 03/18/17 0356 03/19/17 0405 03/20/17 0334  NA 135 136 135 137 139  K 4.0 4.6 4.3 4.5 4.1  CL 102 103 101 102 105  CO2 27 26 25 26 27   GLUCOSE 249* 328* 299* 116* 164*  BUN 15 14 14  22* 21*  CREATININE 0.80 0.94 1.05* 1.21* 1.30*  CALCIUM 8.6* 8.6* 8.5* 8.9 8.6*   GFR: Estimated Creatinine Clearance: 65.1 mL/min (A) (by C-G formula based on SCr  of 1.3 mg/dL (H)). Liver Function Tests: No results for input(s): AST, ALT, ALKPHOS, BILITOT, PROT, ALBUMIN in the last 168 hours. No results for input(s): LIPASE, AMYLASE in the last 168 hours. No results for input(s): AMMONIA in the last 168 hours. Coagulation Profile: No results for input(s): INR, PROTIME in the last 168 hours. Cardiac Enzymes: No results for input(s): CKTOTAL, CKMB, CKMBINDEX, TROPONINI in the last 168 hours. BNP (last 3 results) No results for input(s): PROBNP in the last 8760 hours. HbA1C: No results for input(s): HGBA1C in the last 72 hours. CBG:  Recent Labs Lab 03/19/17 0733 03/19/17 1148 03/19/17 1713 03/19/17 2207 03/20/17 0730  GLUCAP 109* 80 194* 151* 174*   Lipid Profile: No results for input(s): CHOL, HDL, LDLCALC, TRIG, CHOLHDL, LDLDIRECT in the last 72 hours. Thyroid Function Tests: No results for input(s): TSH, T4TOTAL, FREET4, T3FREE, THYROIDAB in the last 72 hours. Anemia Panel: No results for input(s): VITAMINB12, FOLATE, FERRITIN, TIBC, IRON, RETICCTPCT in the last 72 hours. Sepsis Labs:  Recent Labs Lab 03/14/17 1736  LATICACIDVEN 1.09    Recent Results (from the past 240 hour(s))  MRSA PCR Screening     Status: None    Collection Time: 03/14/17  8:25 PM  Result Value Ref Range Status   MRSA by PCR NEGATIVE NEGATIVE Final    Comment:        The GeneXpert MRSA Assay (FDA approved for NASAL specimens only), is one component of a comprehensive MRSA colonization surveillance program. It is not intended to diagnose MRSA infection nor to guide or monitor treatment for MRSA infections.   Surgical pcr screen     Status: None   Collection Time: 03/16/17  6:18 PM  Result Value Ref Range Status   MRSA, PCR NEGATIVE NEGATIVE Final   Staphylococcus aureus NEGATIVE NEGATIVE Final    Comment:        The Xpert SA Assay (FDA approved for NASAL specimens in patients over 52 years of age), is one component of a comprehensive surveillance program.  Test performance has been validated by [facility] for patients greater than or equal to 53 year old. It is not intended to diagnose infection nor to guide or monitor treatment.   Aerobic/Anaerobic Culture (surgical/deep wound)     Status: None (Preliminary result)   Collection Time: 03/17/17 12:55 PM  Result Value Ref Range Status   Specimen Description ABSCESS THIGH  Final   Special Requests NONE  Final   Gram Stain   Final    ABUNDANT WBC PRESENT, PREDOMINANTLY PMN RARE GRAM NEGATIVE RODS RARE GRAM POSITIVE RODS FEW GRAM VARIABLE ROD RARE GRAM POSITIVE COCCI IN PAIRS    Culture   Final    CULTURE REINCUBATED FOR BETTER GROWTH Performed at [facility], [address], [place], [place] [address]    Report Status PENDING  Incomplete       Radiology Studies: No results found.    Scheduled Meds: . acetaminophen  1,000 mg Oral TID  . aspirin EC  325 mg Oral Daily  . enoxaparin (LOVENOX) injection  40 mg Subcutaneous Q24H  . FLUoxetine  40 mg Oral Daily  . insulin aspart  0-20 Units Subcutaneous TID WC  . insulin aspart  0-5 Units Subcutaneous QHS  . insulin aspart protamine- aspart  25 Units Subcutaneous BID WC  . lip balm  1  application Topical BID  . lisinopril  20 mg Oral Daily  . meloxicam  15 mg Oral Daily  . nicotine  14 mg Transdermal Daily  . pantoprazole  40 mg Oral Daily  . psyllium  1 packet Oral BID  . sodium chloride flush  3 mL Intravenous Q12H   Continuous Infusions: . sodium chloride    . sodium chloride 900 mL (03/19/17 1124)  . methocarbamol (ROBAXIN)  IV    . piperacillin-tazobactam (ZOSYN)  IV 3.375 g (03/20/17 5916)     LOS: 6 days    Time spent: 30 minutes   Noralee Stain, DO [facility] [email or web address] Password [facility] 03/20/2017, 7:46 AM

## 2017-03-20 NOTE — Progress Notes (Signed)
3 Days Post-Op I&D   QG:BEEFE groin pain and swelling  Subjective: Still requiring IV meds for dressing changes.    Objective: Vital signs in last 24 hours: Temp:  [97.7 F (36.5 C)-98.3 F (36.8 C)] 97.8 F (36.6 C) (07/21 0610) Pulse Rate:  [64-71] 64 (07/21 0610) Resp:  [16-18] 18 (07/21 0610) BP: (101-105)/(61-70) 105/70 (07/21 0610) SpO2:  [98 %-100 %] 100 % (07/21 0610) Last BM Date: 03/14/17    Specimen Description ABSCESS THIGH   03/17/17  Special Requests NONE   Gram Stain ABUNDANT WBC PRESENT, PREDOMINANTLY PMN  RARE GRAM NEGATIVE RODS  RARE GRAM POSITIVE RODS  FEW GRAM VARIABLE ROD  RARE GRAM POSITIVE COCCI IN PAIRS      Culture NO GROWTH < 24 HOURS  Performed at [facility], [address] [place], [place] [address]      Report Status PENDING    NO films today  MRI 03/16/16:  Muscles and Tendons Soft tissue edema in the of medial upper right thigh with enhancement on postcontrast imaging. Areas of non enhancement within the subcutaneous fat concerning for areas of necrosis measuring approximately 5.2 x 2.6 x 5.7 cm.  Intake/Output from previous day: 07/20 0701 - 07/21 0700 In: 2318.8 [P.O.:600; I.V.:1618.8; IV Piggyback:100] Out: 1600 [Urine:1600] Intake/Output this shift: No intake/output data recorded.  General appearance: alert, cooperative and no distress site looks good, some necrosis one area,but this will come off with wet to dry dressings.    Lab Results:   Recent Labs  03/19/17 0405 03/20/17 0334  WBC 10.7* 8.6  HGB 11.8* 10.9*  HCT 35.9* 33.2*  PLT 235 238    BMET  Recent Labs  03/19/17 0405 03/20/17 0334  NA 137 139  K 4.5 4.1  CL 102 105  CO2 26 27  GLUCOSE 116* 164*  BUN 22* 21*  CREATININE 1.21* 1.30*  CALCIUM 8.9 8.6*   PT/INR No results for input(s): LABPROT, INR in the last 72 hours.  No results for input(s): AST, ALT, ALKPHOS, BILITOT, PROT, ALBUMIN in the last 168 hours.   Lipase     Component  Value Date/Time   LIPASE 194 (H) 11/19/2014 1304     Medications: . acetaminophen  1,000 mg Oral TID  . aspirin EC  325 mg Oral Daily  . enoxaparin (LOVENOX) injection  40 mg Subcutaneous Q24H  . FLUoxetine  40 mg Oral Daily  . insulin aspart  0-20 Units Subcutaneous TID WC  . insulin aspart  0-5 Units Subcutaneous QHS  . insulin aspart protamine- aspart  25 Units Subcutaneous BID WC  . lip balm  1 application Topical BID  . nicotine  14 mg Transdermal Daily  . pantoprazole  40 mg Oral Daily  . psyllium  1 packet Oral BID  . sodium chloride flush  3 mL Intravenous Q12H   Anti-infectives    Start     Dose/Rate Route Frequency Ordered Stop   03/16/17 1500  piperacillin-tazobactam (ZOSYN) IVPB 3.375 g     3.375 g 12.5 mL/hr over 240 Minutes Intravenous Every 8 hours 03/16/17 1437     03/15/17 1000  vancomycin (VANCOCIN) IVPB 1000 mg/200 mL premix  Status:  Discontinued     1,000 mg 200 mL/hr over 60 Minutes Intravenous Every 12 hours 03/14/17 1917 03/19/17 0822   03/14/17 1845  ceFAZolin (ANCEF) IVPB 1 g/50 mL premix     1 g 100 mL/hr over 30 Minutes Intravenous  Once 03/14/17 1831 03/14/17 2010  03/14/17 1845  vancomycin (VANCOCIN) IVPB 1000 mg/200 mL premix     1,000 mg 200 mL/hr over 60 Minutes Intravenous  Once 03/14/17 1838 03/14/17 2111      Assessment/Plan Right thigh soft tissue infection, probable early necrotizing fasciitis S/p IRRIGATION AND DEBRIDEMENT RIGHT THIGH ABSCESS, 03/17/17, Dr. Karie Soda Uncontrolled type II diabetes with neuropathy A1C 15 Hyponatremia Hypertension GERD Depression FEN: CM diet ID: Cefazolin x 2 7/15; Zosyn 7/17 day 5 ; Vancomycin 7/15 =>>day 6 (held) DVT: Lovenox    Plan;  Creatinine slowly creeping up. Will defer w/u to medical team  I think when we get the dressing changes and wound management fears resolved she can go home from our standpoint.  We need to decide on antibiotics, will await final cultures.  Glucose control  is big issue and is improving.     LOS: 6 days    .Vanita Panda, MD  Colorectal and General Surgery [facility]

## 2017-03-21 LAB — CBC WITH DIFFERENTIAL/PLATELET
Basophils Absolute: 0 10*3/uL (ref 0.0–0.1)
Basophils Relative: 0 %
Eosinophils Absolute: 0.1 10*3/uL (ref 0.0–0.7)
Eosinophils Relative: 1 %
HCT: 34.4 % — ABNORMAL LOW (ref 36.0–46.0)
Hemoglobin: 11.1 g/dL — ABNORMAL LOW (ref 12.0–15.0)
Lymphocytes Relative: 56 %
Lymphs Abs: 5.4 10*3/uL — ABNORMAL HIGH (ref 0.7–4.0)
MCH: 29.1 pg (ref 26.0–34.0)
MCHC: 32.3 g/dL (ref 30.0–36.0)
MCV: 90.3 fL (ref 78.0–100.0)
Monocytes Absolute: 0.4 10*3/uL (ref 0.1–1.0)
Monocytes Relative: 5 %
Neutro Abs: 3.5 10*3/uL (ref 1.7–7.7)
Neutrophils Relative %: 38 %
Platelets: 282 10*3/uL (ref 150–400)
RBC: 3.81 MIL/uL — ABNORMAL LOW (ref 3.87–5.11)
RDW: 12.8 % (ref 11.5–15.5)
WBC: 9.4 10*3/uL (ref 4.0–10.5)

## 2017-03-21 LAB — BASIC METABOLIC PANEL
Anion gap: 7 (ref 5–15)
BUN: 18 mg/dL (ref 6–20)
CO2: 29 mmol/L (ref 22–32)
Calcium: 8.8 mg/dL — ABNORMAL LOW (ref 8.9–10.3)
Chloride: 103 mmol/L (ref 101–111)
Creatinine, Ser: 1.21 mg/dL — ABNORMAL HIGH (ref 0.44–1.00)
GFR calc Af Amer: >60 mL/min (ref >60)
GFR calc non Af Amer: 52 mL/min — ABNORMAL LOW (ref >60)
Glucose, Bld: 221 mg/dL — ABNORMAL HIGH (ref 65–99)
Potassium: 4.7 mmol/L (ref 3.5–5.1)
Sodium: 139 mmol/L (ref 135–145)

## 2017-03-21 LAB — GLUCOSE, CAPILLARY: Glucose-Capillary: 176 mg/dL — ABNORMAL HIGH (ref 65–99)

## 2017-03-21 MED ORDER — BISACODYL 5 MG PO TBEC: 5.0000 mg | DELAYED_RELEASE_TABLET | Freq: Every day | ORAL | 0 refills | Status: AC

## 2017-03-21 MED ORDER — POLYETHYLENE GLYCOL 3350 17 G PO PACK: 17.0000 g | PACK | Freq: Every day | ORAL | 0 refills | Status: DC

## 2017-03-21 MED ORDER — INSULIN ASPART 100 UNIT/ML ~~LOC~~ SOLN: SUBCUTANEOUS | 0 refills | Status: DC

## 2017-03-21 MED ORDER — PSYLLIUM 95 % PO PACK: 1.0000 | PACK | Freq: Two times a day (BID) | ORAL | 0 refills | Status: DC

## 2017-03-21 MED ORDER — AMOXICILLIN-POT CLAVULANATE 875-125 MG PO TABS: 1.0000 | ORAL_TABLET | Freq: Two times a day (BID) | ORAL | 0 refills | Status: DC

## 2017-03-21 MED ORDER — OXYCODONE HCL 5 MG PO TABS: 5.0000 mg | ORAL_TABLET | Freq: Four times a day (QID) | ORAL | 0 refills | Status: DC | PRN

## 2017-03-21 MED ORDER — BLOOD GLUCOSE METER KIT: PACK | 0 refills | Status: DC

## 2017-03-21 MED ORDER — "INSULIN SYRINGE 31G X 5/16"" 0.3 ML MISC": 0 refills | Status: DC

## 2017-03-21 MED ORDER — INSULIN ASPART PROT & ASPART (70-30 MIX) 100 UNIT/ML ~~LOC~~ SUSP: 25.0000 [IU] | Freq: Two times a day (BID) | SUBCUTANEOUS | 0 refills | Status: DC

## 2017-03-21 MED ORDER — NICOTINE 14 MG/24HR TD PT24: 14.0000 mg | MEDICATED_PATCH | Freq: Every day | TRANSDERMAL | 0 refills | Status: DC

## 2017-03-21 MED ORDER — ACETAMINOPHEN 500 MG PO TABS: 500.0000 mg | ORAL_TABLET | Freq: Three times a day (TID) | ORAL | 0 refills | Status: DC | PRN

## 2017-03-21 MED ORDER — METFORMIN HCL ER (MOD) 1000 MG PO TB24: 1000.0000 mg | ORAL_TABLET | Freq: Two times a day (BID) | ORAL | 0 refills | Status: DC

## 2017-03-21 NOTE — Care Management Note (Signed)
Case Management Note  Patient Details  Name: ZENIA GUEST MRN: [id number] Date of Birth: [birth year]  Subjective/Objective:    Right thigh necotizing fascitis, DM, AKI, HTN                Action/Plan: Discharge Planning: NCM spoke to pt and states she does not have insurance. States she lives at home alone. She does receive her husband survivor's benefits $500 per month. She does have the [employer] orange discount card. She does go the the [facility]. She will call and arrange appt. Contacted [facility] and they are currently not accepting charity patients. Notified attending and she will updated for pt to contact [facility] for follow up and assistance with wound. Unit RN will provide pt with some supplies to do dressing changes at home. Provided pt with MATCH to use for a $3 copay and one time use each year. Explained to pt that she can contact SS disability office to arrange appt to see if she qualifies for SS disability.   PCP Willey Blade MD  Expected Discharge Date:  03/21/17               Expected Discharge Plan:  Home w Home Health Services  In-House Referral:  NA  Discharge planning Services  CM Consult  Post Acute Care Choice:  Home Health Choice offered to:  Patient  DME Arranged:  N/A DME Agency:  NA  HH Arranged:  NA HH Agency:  NA  Status of Service:  Completed, signed off  If discussed at Long Length of Stay Meetings, dates discussed:    Additional Comments:  Elliot Cousin, RN 03/21/2017, 10:58 AM

## 2017-03-21 NOTE — Progress Notes (Signed)
Patient discharged to home, all discharge medications and instructions reviewed and questions answered.  Patient assisted to vehicle by wheelchair.  

## 2017-03-21 NOTE — Discharge Instructions (Addendum)
Check your blood sugar 4 times daily: before breakfast, before lunch, before dinner, before sleep. You may also check throughout the day if you feel that your blood sugar is too low or too high.   Nolvolog 70/30 Mix 25 units twice daily.   Novolog (insulin aspart) is your short-acting correction insulin. Check your blood sugar prior to your meal. Then depending on your blood sugar, inject insulin as follows: CBG 121 - 150: 3 units  CBG 151 - 200: 4 units  CBG 201 - 250: 7 units  CBG 251 - 300: 11 units  CBG 301 - 350: 15 units  CBG 351 - 400: 20 units   Keep a journal of your blood sugar results each day. Bring to your primary care physician.     WOUND CARE  It is important that the wound be kept open.   -Keeping the skin edges apart will allow the wound to gradually heal from the base upwards.   - If the skin edges of the wound close too early, a new fluid pocket can form and infection can occur. -This is the reason to pack deeper wounds with gauze or ribbon -This is why drained wounds cannot be sewed closed right away  A healthy wound should form a lining of bright red "beefy" granulating tissue that will help shrink the wound and help the edges grow new skin into it.   -A little mucus / yellow discharge is normal (the body's natural way to try and form a scab) and should be gently washed off with soap and water with daily dressing changes.  -Green or foul smelling drainage implies bacterial colonization and can slow wound healing - a short course of antibiotic ointment (3-5 days) can help it clear up.  Call the doctor if it does not improve or worsens  -Avoid use of antibiotic ointments for more than a week as they can slow wound healing over time.    -Sometimes other wound care products will be used to reduce need for dressing changes and/or help clean up dirty wounds -Sometimes the surgeon needs to debride the wound in the office to remove dead or infected tissue out of the wound  so it can heal more quickly and safely.    Change the dressing at least once a day -Wash the wound with mild soap and water gently every day.  It is good to shower or bathe the wound to help it clean out. -Use clean KERLIX rolled gauze (it does not need to be sterile, just clean) -Keep the raw wound moist with a little saline or KY (saline) gel on the gauze.  -A dry wound will take longer to heal.  -Keep the skin dry around the wound to prevent breakdown and irritation. -Pack the wound down to the base -The goal is to keep the skin apart, not overpack the wound -Use a Q-tip or blunt-tipped kabob stick toothpick to push the gauze down to the base in narrow or deep wounds   -Cover with a clean gauze and tape -paper or Medipore tape tend to be gentle on the skin -rotate the orientation of the tape to avoid repeated stress/trauma on the skin -using an ACE or Coban wrap on wounds on arms or legs can be used instead.  Complete all antibiotics through the entire prescription to help the infection heal and prevent new places of infection   Returning the see the surgeon is helpful to follow the healing process and help the  wound close as fast as possible.

## 2017-03-21 NOTE — Discharge Summary (Addendum)
Physician Discharge Summary  [name] [id number] DOB: [birth year] DOA: 03/14/2017  PCP: Rogers Blocker, MD  Admit date: 03/14/2017 Discharge date: 03/21/2017  Admitted From: Home Disposition:  Home  Recommendations for Outpatient Follow-up:  1. Follow up with PCP in 1 week. Need better blood glucose control 2. Follow up with [facility] in 10 days  3. Please obtain BMP/CBC in 1 week. Lisinopril is held on discharge until Cr back to baseline.  4. Please follow up on the following pending results: final wound culture results, normal flora at time of discharge.  5. Unfortunately unable to set up home health nursing for wound care as no agency is taking charity cases and patient unable to afford. We will set up for outpatient wound care center follow up. CM assisting and has spoken with patient.   Discharge Condition: Stable CODE STATUS: Full  Diet recommendation: Carb modified   Brief/Interim Summary: Meghan C Godwinis a 46 y.o.femalewith medical history significant for insulin-dependent diabetes mellitus, not taking insulin due to prohibitive cost, now presenting to the emergency department for evaluation of severe pain, swelling, and redness in the right groin. Patient reports that she had been in her usual state until approximately 03/11/2017 when she noted a painful lump near the right groin. Over the next day, this increased in size, significant surrounding erythema developed, and pain worsened. She was evaluated for this on 03/12/2017 in an emergency department in [place] where she underwent I&D with drainage of purulent material, and was started on clindamycin. She reports that over the ensuing 2 days,pain and swelling has worsened and erythema has extended down the medial right thigh. She was treated with IV antibiotics, and due to worsening pain, underwent MRI right thigh which showed soft tissue edema with area concerning for fat necrosis. General surgery was  consulted for I&D and debridement and underwent procedure 7/18.   Right thigh necrotizing fasciitis  -MRI right femur showed cellulitis of the upper medial right thigh with mild fasciitis involving the right upper gracilis muscle. Areas of non enhancement within the subcutaneous fat concerning for areas of necrosis. -[facility] microbiology, ED, and medical records for wound culture that was taken on 7/13 in the ED. Apparently, sample was not sent for culture at the time.  -S/p I&D with debridement with Dr. Johney Maine 7/18. Culture obtained in OR, with normal flora, no anaerobes isolated, in progress for 5 days  -Wound care and dressing changes per general surgery. 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 Scripts provided at time of discharge  -CM contacted for assistance with meds at discharge  -Diabetes coordinator following  -Blood sugars improved  AKI -Baseline Cr 0.82, now 1.3 -Likely in combination of poor oral intake post-operatively, vancomycin, lisinopril. Vanco stopped 7/20, lisinopril held  -Improving today. Continue to hold lisinopril until follow up with PCP.   RLQ abdominal pain -Unclear etiology, she states this has been intermittent since prior to surgery, but worse this morning. She is s/p appendectomy in 2013. Had 3 BM yesterday after suppository. Abdominal pain is achy in nature and feels better when she holds her belly  -Improved today, thinks it is related to bowel movements   HTN -Stable overall  -Lisinopril - hold in setting of AKI   GERD -Continue PPI  Depression -Continue  prozac  Tobacco abuse -Nicotine patch, cessation counseling     Discharge Instructions  Discharge Instructions    Call MD for:  difficulty breathing,  headache or visual disturbances    Complete by:  As directed    Call MD for:  extreme fatigue    Complete by:  As directed    Call MD for:  hives    Complete by:  As directed    Call MD for:  persistant dizziness or light-headedness    Complete by:  As directed    Call MD for:  persistant nausea and vomiting    Complete by:  As directed    Call MD for:  redness, tenderness, or signs of infection (pain, swelling, redness, odor or green/yellow discharge around incision site)    Complete by:  As directed    Call MD for:  severe uncontrolled pain    Complete by:  As directed    Call MD for:  temperature >100.4    Complete by:  As directed    Diet Carb Modified    Complete by:  As directed    Discharge instructions    Complete by:  As directed    You were cared for by a hospitalist during your hospital stay. If you have any questions about your discharge medications or the care you received while you were in the hospital after you are discharged, you can call the unit and asked to speak with the hospitalist on call if the hospitalist that took care of you is not available. Once you are discharged, your primary care physician will handle any further medical issues. Please note that NO REFILLS for any discharge medications will be authorized once you are discharged, as it is imperative that you return to your primary care physician (or establish a relationship with a primary care physician if you do not have one) for your aftercare needs so that they can reassess your need for medications and monitor your lab values.   Discharge wound care:    Complete by:  As directed    Pack wound daily, wet to dry dressing change   Discharge wound care:    Complete by:  As directed    1.  Remove cover dressing & packing.  May moisten to help remove. 2.  Wash wound gently with soap & warm tap water 3.  Repack wound to its base witH Kerlix 4.5 inch-wide roll saline-moistened gauze(s) 4.  Cover with dry  dressing (gauze or ABD pads) & paper tape / Coban wrap May reinforce or change outer cover dressing PRN   Increase activity slowly    Complete by:  As directed      Allergies as of 03/21/2017      Reactions   Codeine Itching   Sulfa Antibiotics Hives      Medication List    STOP taking these medications   clindamycin 300 MG capsule Commonly known as:  CLEOCIN   lisinopril 20 MG tablet Commonly known as:  PRINIVIL,ZESTRIL   meloxicam 7.5 MG tablet Commonly known as:  MOBIC     TAKE these medications   acetaminophen 500 MG tablet Commonly known as:  TYLENOL Take 1-2 tablets (500-1,000 mg total) by mouth every 8 (eight) hours as needed for mild pain or moderate pain. Not to exceed 4,09m in 24 hour period   amoxicillin-clavulanate 875-125 MG tablet Commonly known as:  AUGMENTIN Take 1 tablet by mouth 2 (two) times daily.   aspirin EC 325 MG  tablet Take 325 mg by mouth daily.   bisacodyl 5 MG EC tablet Commonly known as:  DULCOLAX Take 1 tablet (5 mg total) by mouth daily.   blood glucose meter kit and supplies Dispense based on patient and insurance preference. Use up to four times daily as directed. (FOR ICD-9 250.00, 250.01).   esomeprazole 40 MG capsule Commonly known as:  NEXIUM Take 40 mg by mouth daily at 12 noon.   FLUoxetine 40 MG capsule Commonly known as:  PROZAC Take 40 mg by mouth daily.   gabapentin 300 MG capsule Commonly known as:  NEURONTIN Take 300 mg by mouth daily as needed (pain).   insulin aspart 100 UNIT/ML injection Commonly known as:  NOVOLOG Novolog is your short-acting correction insulin. Check your blood sugar prior to your meal. Then depending on your blood sugar, inject insulin as follows: CBG 121 - 150: 3 units CBG 151 - 200: 4 units CBG 201 - 250: 7 units CBG 251 - 300: 11 units CBG 301 - 350: 15 units CBG 351 - 400: 20 units   insulin aspart protamine- aspart (70-30) 100 UNIT/ML injection Commonly known as:  NOVOLOG MIX  70/30 Inject 0.25 mLs (25 Units total) into the skin 2 (two) times daily with a meal.   INSULIN SYRINGE .3CC/31GX5/16" 31G X 5/16" 0.3 ML Misc Use with insulin, up to 5 times daily   metFORMIN 1000 MG (MOD) 24 hr tablet Commonly known as:  GLUMETZA Take 1 tablet (1,000 mg total) by mouth 2 (two) times daily with a meal. What changed:  medication strength  how much to take   nicotine 14 mg/24hr patch Commonly known as:  NICODERM CQ - dosed in mg/24 hours Place 1 patch (14 mg total) onto the skin daily.   oxyCODONE 5 MG immediate release tablet Commonly known as:  Oxy IR/ROXICODONE Take 1-2 tablets (5-10 mg total) by mouth every 6 (six) hours as needed for moderate pain, severe pain or breakthrough pain (with dressing changes).   polyethylene glycol packet Commonly known as:  MIRALAX / GLYCOLAX Take 17 g by mouth daily.   psyllium 95 % Pack Commonly known as:  HYDROCIL/METAMUCIL Take 1 packet by mouth 2 (two) times daily.      Follow-up [facility], [place]. Schedule an appointment as soon as possible for a visit in 10 day(s).   Specialty:  General Surgery Why:  To follow up after your operation, To follow up after your hospital stay Contact information: [address] [place]       Rogers Blocker, MD. Schedule an appointment as soon as possible for a visit in 1 week(s).   Specialty:  Internal Medicine Contact information: [place] [id number] [facility]              Follow up.   Why:  please call to arrange appointment Contact information: [place]. [place] [phone]         Allergies  Allergen Reactions  . Codeine Itching  . Sulfa Antibiotics Hives    Consultations:  General Surgery   Procedures/Studies: Dg Abd 1 View  Result Date:  03/20/2017 CLINICAL DATA:  RIGHT lower quadrant abdominal pain today, history of hypertension, diabetes mellitus, kidney stones, prior appendectomy EXAM: ABDOMEN - 1 VIEW COMPARISON:  CT abdomen and pelvis 10/04/2011  FINDINGS: Normal bowel gas pattern. No bowel dilatation or bowel wall thickening. Scattered stool throughout colon most prominent at hepatic flexure. No urinary tract calcifications. Small pelvic phleboliths. Osseous structures unremarkable. IMPRESSION: No acute abnormalities Electronically Signed   By: Lavonia Dana M.D.   On: 03/20/2017 10:11   Mr Femur Right W Wo Contrast  Result Date: 03/16/2017 CLINICAL DATA:  Abscess of right thigh, right sided inner upper thigh. Redness swelling and heat. eval for extent of infection. Pt unable to keep legs together due to pain. EXAM: MRI OF THE RIGHT FEMUR WITHOUT AND WITH CONTRAST TECHNIQUE: Multiplanar, multisequence MR imaging of the left femur was performed both before and after administration of intravenous contrast. CONTRAST:  67m MULTIHANCE GADOBENATE DIMEGLUMINE 529 MG/ML IV SOLN COMPARISON:  None. FINDINGS: Bones/Joint/Cartilage No marrow signal abnormality. No fracture or dislocation. Normal alignment. No joint effusion. Ligaments Collateral ligaments are intact. Muscles and Tendons Soft tissue edema in the of medial upper right thigh with enhancement on postcontrast imaging. Areas of non enhancement within the subcutaneous fat concerning for areas of necrosis measuring approximately 5.2 x 2.6 x 5.7 cm. Perifascial edema surrounding the superior aspect of the right gracilis muscle. Mild edema in the superior aspect of the right gracilis muscle. No intramuscular fluid collection or hematoma. Remainder the muscles are normal in signal.  No muscle atrophy. Soft tissue No other fluid collection or hematoma.  No soft tissue mass. IMPRESSION: Cellulitis of the upper medial right thigh with mild fasciitis involving the right upper gracilis muscle. Mild  muscle edema in the upper right gracilis muscle likely reactive versus less likely secondary to mild myositis. Areas of non enhancement within the subcutaneous fat concerning for areas of necrosis. Electronically Signed   By: HKathreen Devoid  On: 03/16/2017 12:27       Discharge Exam: Vitals:   03/20/17 2043 03/21/17 0608  BP: 107/72 122/84  Pulse: 64 65  Resp: 16 18  Temp: 98.7 F (37.1 C) 97.8 F (36.6 C)   Vitals:   03/19/17 2021 03/20/17 0610 03/20/17 2043 03/21/17 0608  BP: 102/67 105/70 107/72 122/84  Pulse: 67 64 64 65  Resp: 16 18 16 18   Temp: 98.3 F (36.8 C) 97.8 F (36.6 C) 98.7 F (37.1 C) 97.8 F (36.6 C)  TempSrc: Oral Oral Oral Oral  SpO2: 98% 100% 100% 100%  Weight:      Height:        General: Pt is alert, awake, not in acute distress Cardiovascular: RRR, S1/S2 +, no rubs, no gallops Respiratory: CTA bilaterally, no wheezing, no rhonchi Abdominal: Soft, NT, ND, bowel sounds + Extremities: no edema, no cyanosis    The results of significant diagnostics from this hospitalization (including imaging, microbiology, ancillary and laboratory) are listed below for reference.     Microbiology: Recent Results (from the past 240 hour(s))  MRSA PCR Screening     Status: None   Collection Time: 03/14/17  8:25 PM  Result Value Ref Range Status   MRSA by PCR NEGATIVE NEGATIVE Final    Comment:        The GeneXpert MRSA Assay (FDA approved for NASAL specimens only), is one component of a comprehensive MRSA colonization surveillance program. It is not intended to diagnose MRSA infection nor to guide or monitor treatment for MRSA infections.   Surgical pcr screen     Status: None   Collection Time: 03/16/17  6:18 PM  Result Value Ref Range Status   MRSA, PCR NEGATIVE NEGATIVE  Final   Staphylococcus aureus NEGATIVE NEGATIVE Final    Comment:        The Xpert SA Assay (FDA approved for NASAL specimens in patients over 61 years of age), is one component  of a comprehensive surveillance program.  Test performance has been validated by [facility] for patients greater than or equal to 22 year old. It is not intended to diagnose infection nor to guide or monitor treatment.   Aerobic/Anaerobic Culture (surgical/deep wound)     Status: None (Preliminary result)   Collection Time: 03/17/17 12:55 PM  Result Value Ref Range Status   Specimen Description ABSCESS THIGH  Final   Special Requests NONE  Final   Gram Stain   Final    ABUNDANT WBC PRESENT, PREDOMINANTLY PMN RARE GRAM NEGATIVE RODS RARE GRAM POSITIVE RODS FEW GRAM VARIABLE ROD RARE GRAM POSITIVE COCCI IN PAIRS Performed at [facility], [place] [address], [place], [place] [address]    Culture   Final    NORMAL SKIN FLORA NO ANAEROBES ISOLATED; CULTURE IN PROGRESS FOR 5 DAYS    Report Status PENDING  Incomplete     Labs: BNP (last 3 results) No results for input(s): BNP in the last 8760 hours. Basic Metabolic Panel:  Recent Labs Lab 03/17/17 0342 03/18/17 0356 03/19/17 0405 03/20/17 0334 03/21/17 0424  NA 136 135 137 139 139  K 4.6 4.3 4.5 4.1 4.7  CL 103 101 102 105 103  CO2 26 25 26 27 29   GLUCOSE 328* 299* 116* 164* 221*  BUN 14 14 22* 21* 18  CREATININE 0.94 1.05* 1.21* 1.30* 1.21*  CALCIUM 8.6* 8.5* 8.9 8.6* 8.8*   Liver Function Tests: No results for input(s): AST, ALT, ALKPHOS, BILITOT, PROT, ALBUMIN in the last 168 hours. No results for input(s): LIPASE, AMYLASE in the last 168 hours. No results for input(s): AMMONIA in the last 168 hours. CBC:  Recent Labs Lab 03/14/17 1724  03/17/17 0342 03/18/17 0356 03/19/17 0405 03/20/17 0334 03/21/17 0424  WBC 14.0*  < > 9.7 11.1* 10.7* 8.6 9.4  NEUTROABS 10.5*  --   --  8.4* 5.1 4.3 3.5  HGB 13.8  < > 12.1 11.4* 11.8* 10.9* 11.1*  HCT 40.1  < > 35.5* 34.6* 35.9* 33.2* 34.4*  MCV 85.9  < > 85.5 87.4 88.6 87.4 90.3  PLT 164  < > 177 222 235 238 282  < > = values in this interval not  displayed. Cardiac Enzymes: No results for input(s): CKTOTAL, CKMB, CKMBINDEX, TROPONINI in the last 168 hours. BNP: Invalid input(s): POCBNP CBG:  Recent Labs Lab 03/20/17 0730 03/20/17 1206 03/20/17 1649 03/20/17 2222 03/21/17 0804  GLUCAP 174* 227* 147* 123* 176*   D-Dimer No results for input(s): DDIMER in the last 72 hours. Hgb A1c No results for input(s): HGBA1C in the last 72 hours. Lipid Profile No results for input(s): CHOL, HDL, LDLCALC, TRIG, CHOLHDL, LDLDIRECT in the last 72 hours. Thyroid function studies No results for input(s): TSH, T4TOTAL, T3FREE, THYROIDAB in the last 72 hours.  Invalid input(s): FREET3 Anemia work up No results for input(s): VITAMINB12, FOLATE, FERRITIN, TIBC, IRON, RETICCTPCT in the last 72 hours. Urinalysis    Component Value Date/Time   COLORURINE YELLOW 12/12/2013 1608   APPEARANCEUR CLEAR 12/12/2013 1608   LABSPEC 1.018 12/12/2013 1608   PHURINE 5.0 12/12/2013 1608   GLUCOSEU >1000 (A) 12/12/2013 1608   HGBUR NEGATIVE 12/12/2013 [place] 12/12/2013 1608   KETONESUR NEGATIVE 12/12/2013  [place] 12/12/2013 1608   UROBILINOGEN 0.2 12/12/2013 1608   NITRITE NEGATIVE 12/12/2013 1608   LEUKOCYTESUR NEGATIVE 12/12/2013 1608   Sepsis Labs Invalid input(s): PROCALCITONIN,  WBC,  LACTICIDVEN Microbiology Recent Results (from the past 240 hour(s))  MRSA PCR Screening     Status: None   Collection Time: 03/14/17  8:25 PM  Result Value Ref Range Status   MRSA by PCR NEGATIVE NEGATIVE Final    Comment:        The GeneXpert MRSA Assay (FDA approved for NASAL specimens only), is one component of a comprehensive MRSA colonization surveillance program. It is not intended to diagnose MRSA infection nor to guide or monitor treatment for MRSA infections.   Surgical pcr screen     Status: None   Collection Time: 03/16/17  6:18 PM  Result Value Ref Range Status   MRSA, PCR NEGATIVE NEGATIVE Final    Staphylococcus aureus NEGATIVE NEGATIVE Final    Comment:        The Xpert SA Assay (FDA approved for NASAL specimens in patients over 70 years of age), is one component of a comprehensive surveillance program.  Test performance has been validated by [facility] for patients greater than or equal to 12 year old. It is not intended to diagnose infection nor to guide or monitor treatment.   Aerobic/Anaerobic Culture (surgical/deep wound)     Status: None (Preliminary result)   Collection Time: 03/17/17 12:55 PM  Result Value Ref Range Status   Specimen Description ABSCESS THIGH  Final   Special Requests NONE  Final   Gram Stain   Final    ABUNDANT WBC PRESENT, PREDOMINANTLY PMN RARE GRAM NEGATIVE RODS RARE GRAM POSITIVE RODS FEW GRAM VARIABLE ROD RARE GRAM POSITIVE COCCI IN PAIRS Performed at [facility], [place] [address], [place], [place] [address]    Culture   Final    NORMAL SKIN FLORA NO ANAEROBES ISOLATED; CULTURE IN PROGRESS FOR 5 DAYS    Report Status PENDING  Incomplete     Time coordinating discharge: 40 minutes  SIGNED:  Dessa Phi, DO [facility] Pager [phone]  If 7PM-7AM, please contact night-coverage [email or web address] Password [facility] 03/21/2017, 10:46 AM

## 2017-03-21 NOTE — Final Consult Note (Signed)
Consultant Final Sign-Off Note    Assessment/Final recommendations  [name] is a 46 y.o. female followed by [facility] for thigh abscess   Wound care (if applicable): Pack wound daily, wet to dry dressing change   Diet at discharge: per primary team   Activity at discharge: per primary team   Follow-up appointment:     Pending results:  Unresulted Labs    Start     Ordered   03/21/17 0500  Creatinine, serum  (enoxaparin (LOVENOX)    CrCl >/= 30 ml/min)  Weekly,   R    Comments:  while on enoxaparin therapy    03/14/17 1928   03/18/17 0500  Creatinine, serum  Daily,   R    Question:  Specimen collection method  Answer:  Lab=Lab collect   03/17/17 0950   03/18/17 0500  CBC with Differential/Platelet  Daily,   R    Question:  Specimen collection method  Answer:  Lab=Lab collect   03/17/17 1305   03/18/17 0500  Basic metabolic panel  Daily,   R    Question:  Specimen collection method  Answer:  Lab=Lab collect   03/17/17 1305       Medication recommendations:   Other recommendations: ok to d/c when tolerating dressing changes with PO narcotics    Thank you for allowing [place] to participate in the care of your patient!  Please consult [place] again if you have further needs for your patient.  Armen Waring C. 03/21/2017 8:29 AM    Subjective   Still requiring IV narcotics with drsg changes but going to try without this am  Objective  Vital signs in last 24 hours: Temp:  [97.8 F (36.6 C)-98.7 F (37.1 C)] 97.8 F (36.6 C) (07/22 8938) Pulse Rate:  [64-65] 65 (07/22 0608) Resp:  [16-18] 18 (07/22 1017) BP: (107-122)/(72-84) 122/84 (07/22 5102) SpO2:  [100 %] 100 % (07/22 5852)  General: NAD Wound: clean   Pertinent labs and Studies:  Recent Labs  03/19/17 0405 03/20/17 0334 03/21/17 0424  WBC 10.7* 8.6 9.4  HGB 11.8* 10.9* 11.1*  HCT 35.9* 33.2* 34.4*   BMET  Recent Labs  03/20/17 0334 03/21/17 0424  NA 139 139  K 4.1 4.7  CL 105 103  CO2 27  29  GLUCOSE 164* 221*  BUN 21* 18  CREATININE 1.30* 1.21*  CALCIUM 8.6* 8.8*   No results for input(s): LABURIN in the last 72 hours. Results for orders placed or performed during the hospital encounter of 03/14/17  MRSA PCR Screening     Status: None   Collection Time: 03/14/17  8:25 PM  Result Value Ref Range Status   MRSA by PCR NEGATIVE NEGATIVE Final    Comment:        The GeneXpert MRSA Assay (FDA approved for NASAL specimens only), is one component of a comprehensive MRSA colonization surveillance program. It is not intended to diagnose MRSA infection nor to guide or monitor treatment for MRSA infections.   Surgical pcr screen     Status: None   Collection Time: 03/16/17  6:18 PM  Result Value Ref Range Status   MRSA, PCR NEGATIVE NEGATIVE Final   Staphylococcus aureus NEGATIVE NEGATIVE Final    Comment:        The Xpert SA Assay (FDA approved for NASAL specimens in patients over 70 years of age), is one component of a comprehensive surveillance program.  Test performance has been validated by [facility] for patients greater than  or equal to 50 year old. It is not intended to diagnose infection nor to guide or monitor treatment.   Aerobic/Anaerobic Culture (surgical/deep wound)     Status: None (Preliminary result)   Collection Time: 03/17/17 12:55 PM  Result Value Ref Range Status   Specimen Description ABSCESS THIGH  Final   Special Requests NONE  Final   Gram Stain   Final    ABUNDANT WBC PRESENT, PREDOMINANTLY PMN RARE GRAM NEGATIVE RODS RARE GRAM POSITIVE RODS FEW GRAM VARIABLE ROD RARE GRAM POSITIVE COCCI IN PAIRS Performed at [facility], [address] [place], [place] [address]    Culture   Final    NORMAL SKIN FLORA NO ANAEROBES ISOLATED; CULTURE IN PROGRESS FOR 5 DAYS    Report Status PENDING  Incomplete    Imaging: Dg Abd 1 View  Result Date: 03/20/2017 CLINICAL DATA:  RIGHT lower quadrant abdominal pain today, history of  hypertension, diabetes mellitus, kidney stones, prior appendectomy EXAM: ABDOMEN - 1 VIEW COMPARISON:  CT abdomen and pelvis 10/04/2011 FINDINGS: Normal bowel gas pattern. No bowel dilatation or bowel wall thickening. Scattered stool throughout colon most prominent at hepatic flexure. No urinary tract calcifications. Small pelvic phleboliths. Osseous structures unremarkable. IMPRESSION: No acute abnormalities Electronically Signed   By: Ulyses Southward M.D.   On: 03/20/2017 10:11

## 2017-03-22 LAB — AEROBIC/ANAEROBIC CULTURE W GRAM STAIN (SURGICAL/DEEP WOUND): Culture: NORMAL

## 2017-03-22 LAB — AEROBIC/ANAEROBIC CULTURE (SURGICAL/DEEP WOUND)

## 2017-03-28 ENCOUNTER — Encounter (HOSPITAL_COMMUNITY): Payer: Self-pay | Admitting: Emergency Medicine

## 2017-03-28 ENCOUNTER — Inpatient Hospital Stay (HOSPITAL_COMMUNITY)
Admission: EM | Admit: 2017-03-28 | Discharge: 2017-03-30 | DRG: 863 | Disposition: A | Discharge status: Home / Self Care | Payer: Self-pay | Attending: Family Medicine | Admitting: Family Medicine

## 2017-03-28 DIAGNOSIS — F329 Major depressive disorder, single episode, unspecified: Secondary | ICD-10-CM | POA: Diagnosis present

## 2017-03-28 DIAGNOSIS — M726 Necrotizing fasciitis: Secondary | ICD-10-CM | POA: Diagnosis present

## 2017-03-28 DIAGNOSIS — F17210 Nicotine dependence, cigarettes, uncomplicated: Secondary | ICD-10-CM | POA: Diagnosis present

## 2017-03-28 DIAGNOSIS — L089 Local infection of the skin and subcutaneous tissue, unspecified: Secondary | ICD-10-CM | POA: Diagnosis present

## 2017-03-28 DIAGNOSIS — T814XXA Infection following a procedure, initial encounter: Principal | ICD-10-CM | POA: Diagnosis present

## 2017-03-28 DIAGNOSIS — Z794 Long term (current) use of insulin: Secondary | ICD-10-CM

## 2017-03-28 DIAGNOSIS — Z885 Allergy status to narcotic agent status: Secondary | ICD-10-CM

## 2017-03-28 DIAGNOSIS — T148XXA Other injury of unspecified body region, initial encounter: Secondary | ICD-10-CM

## 2017-03-28 DIAGNOSIS — E1165 Type 2 diabetes mellitus with hyperglycemia: Secondary | ICD-10-CM | POA: Diagnosis present

## 2017-03-28 DIAGNOSIS — Z79899 Other long term (current) drug therapy: Secondary | ICD-10-CM

## 2017-03-28 DIAGNOSIS — Z8249 Family history of ischemic heart disease and other diseases of the circulatory system: Secondary | ICD-10-CM

## 2017-03-28 DIAGNOSIS — Y838 Other surgical procedures as the cause of abnormal reaction of the patient, or of later complication, without mention of misadventure at the time of the procedure: Secondary | ICD-10-CM | POA: Diagnosis present

## 2017-03-28 DIAGNOSIS — Z791 Long term (current) use of non-steroidal anti-inflammatories (NSAID): Secondary | ICD-10-CM

## 2017-03-28 DIAGNOSIS — Z7982 Long term (current) use of aspirin: Secondary | ICD-10-CM

## 2017-03-28 DIAGNOSIS — Z882 Allergy status to sulfonamides status: Secondary | ICD-10-CM

## 2017-03-28 DIAGNOSIS — E1100 Type 2 diabetes mellitus with hyperosmolarity without nonketotic hyperglycemic-hyperosmolar coma (NKHHC): Secondary | ICD-10-CM

## 2017-03-28 DIAGNOSIS — Z4889 Encounter for other specified surgical aftercare: Secondary | ICD-10-CM

## 2017-03-28 DIAGNOSIS — Z9119 Patient's noncompliance with other medical treatment and regimen: Secondary | ICD-10-CM

## 2017-03-28 DIAGNOSIS — Z823 Family history of stroke: Secondary | ICD-10-CM

## 2017-03-28 LAB — BASIC METABOLIC PANEL
Anion gap: 9 (ref 5–15)
BUN: 18 mg/dL (ref 6–20)
CO2: 24 mmol/L (ref 22–32)
Calcium: 9.3 mg/dL (ref 8.9–10.3)
Chloride: 104 mmol/L (ref 101–111)
Creatinine, Ser: 0.98 mg/dL (ref 0.44–1.00)
GFR calc Af Amer: >60 mL/min (ref >60)
GFR calc non Af Amer: >60 mL/min (ref >60)
Glucose, Bld: 66 mg/dL (ref 65–99)
Potassium: 4.3 mmol/L (ref 3.5–5.1)
Sodium: 137 mmol/L (ref 135–145)

## 2017-03-28 LAB — CBC WITH DIFFERENTIAL/PLATELET
Basophils Absolute: 0 10*3/uL (ref 0.0–0.1)
Basophils Relative: 0 %
Eosinophils Absolute: 0 10*3/uL (ref 0.0–0.7)
Eosinophils Relative: 0 %
HCT: 37 % (ref 36.0–46.0)
Hemoglobin: 12.3 g/dL (ref 12.0–15.0)
Lymphocytes Relative: 31 %
Lymphs Abs: 3.9 10*3/uL (ref 0.7–4.0)
MCH: 29.4 pg (ref 26.0–34.0)
MCHC: 33.2 g/dL (ref 30.0–36.0)
MCV: 88.5 fL (ref 78.0–100.0)
Monocytes Absolute: 0.6 10*3/uL (ref 0.1–1.0)
Monocytes Relative: 4 %
Neutro Abs: 8.2 10*3/uL — ABNORMAL HIGH (ref 1.7–7.7)
Neutrophils Relative %: 65 %
Platelets: 462 10*3/uL — ABNORMAL HIGH (ref 150–400)
RBC: 4.18 MIL/uL (ref 3.87–5.11)
RDW: 12.6 % (ref 11.5–15.5)
WBC: 12.7 10*3/uL — ABNORMAL HIGH (ref 4.0–10.5)

## 2017-03-28 LAB — CBG MONITORING, ED: Glucose-Capillary: 113 mg/dL — ABNORMAL HIGH (ref 65–99)

## 2017-03-28 LAB — GLUCOSE, CAPILLARY
Glucose-Capillary: 123 mg/dL — ABNORMAL HIGH (ref 65–99)
Glucose-Capillary: 122 mg/dL — ABNORMAL HIGH (ref 65–99)

## 2017-03-28 MED ORDER — FLUOXETINE HCL 20 MG PO CAPS
40.0000 mg | ORAL_CAPSULE | Freq: Every day | ORAL | Status: DC
  Administered 2017-03-29 – 2017-03-30 (×2): 40 mg via ORAL
  Filled 2017-03-28 (×2): qty 2

## 2017-03-28 MED ORDER — SODIUM CHLORIDE 0.9 % IV SOLN
250.0000 mL | INTRAVENOUS | Status: DC | PRN
Start: 2017-03-28 — End: 2017-03-30

## 2017-03-28 MED ORDER — OXYCODONE HCL 5 MG PO TABS
5.0000 mg | ORAL_TABLET | Freq: Four times a day (QID) | ORAL | Status: DC | PRN
  Administered 2017-03-28 – 2017-03-29 (×4): 10 mg via ORAL
  Filled 2017-03-28 (×4): qty 2

## 2017-03-28 MED ORDER — BISACODYL 5 MG PO TBEC: 5.0000 mg | DELAYED_RELEASE_TABLET | Freq: Every day | ORAL | Status: DC | PRN

## 2017-03-28 MED ORDER — LISINOPRIL 20 MG PO TABS
20.0000 mg | ORAL_TABLET | Freq: Every day | ORAL | Status: DC
  Administered 2017-03-29 – 2017-03-30 (×2): 20 mg via ORAL
  Filled 2017-03-28 (×2): qty 1

## 2017-03-28 MED ORDER — INSULIN ASPART 100 UNIT/ML ~~LOC~~ SOLN
0.0000 [IU] | Freq: Three times a day (TID) | SUBCUTANEOUS | Status: DC
  Administered 2017-03-28 – 2017-03-29 (×5): 1 [IU] via SUBCUTANEOUS
  Administered 2017-03-30 (×2): 2 [IU] via SUBCUTANEOUS

## 2017-03-28 MED ORDER — SODIUM CHLORIDE 0.9% FLUSH
3.0000 mL | Freq: Two times a day (BID) | INTRAVENOUS | Status: DC
  Administered 2017-03-28 – 2017-03-29 (×2): 3 mL via INTRAVENOUS

## 2017-03-28 MED ORDER — ONDANSETRON HCL 4 MG/2ML IJ SOLN: 4.0000 mg | Freq: Four times a day (QID) | INTRAMUSCULAR | Status: DC | PRN

## 2017-03-28 MED ORDER — PNEUMOCOCCAL VAC POLYVALENT 25 MCG/0.5ML IJ INJ
0.5000 mL | INJECTION | INTRAMUSCULAR | Status: AC
  Administered 2017-03-29: 0.5 mL via INTRAMUSCULAR
  Filled 2017-03-28 (×2): qty 0.5

## 2017-03-28 MED ORDER — FENTANYL CITRATE (PF) 100 MCG/2ML IJ SOLN
50.0000 ug | Freq: Once | INTRAMUSCULAR | Status: AC | PRN
  Administered 2017-03-28: 50 ug via INTRAVENOUS
  Filled 2017-03-28: qty 2

## 2017-03-28 MED ORDER — VANCOMYCIN HCL IN DEXTROSE 1-5 GM/200ML-% IV SOLN
1000.0000 mg | Freq: Once | INTRAVENOUS | Status: AC
  Administered 2017-03-28: 1000 mg via INTRAVENOUS
  Filled 2017-03-28: qty 200

## 2017-03-28 MED ORDER — SODIUM CHLORIDE 0.9 % IV SOLN
3.0000 g | Freq: Four times a day (QID) | INTRAVENOUS | Status: DC
  Administered 2017-03-28 – 2017-03-30 (×8): 3 g via INTRAVENOUS
  Filled 2017-03-28 (×9): qty 3

## 2017-03-28 MED ORDER — MORPHINE SULFATE (PF) 2 MG/ML IV SOLN
1.0000 mg | Freq: Four times a day (QID) | INTRAVENOUS | Status: DC | PRN
  Administered 2017-03-28 – 2017-03-29 (×4): 1 mg via INTRAVENOUS
  Filled 2017-03-28 (×4): qty 1

## 2017-03-28 MED ORDER — PSYLLIUM 95 % PO PACK
1.0000 | PACK | Freq: Two times a day (BID) | ORAL | Status: DC | PRN
  Administered 2017-03-29: 1 via ORAL
  Filled 2017-03-28: qty 1

## 2017-03-28 MED ORDER — SODIUM CHLORIDE 0.9% FLUSH: 3.0000 mL | INTRAVENOUS | Status: DC | PRN

## 2017-03-28 MED ORDER — METFORMIN HCL ER 500 MG PO TB24
1000.0000 mg | ORAL_TABLET | Freq: Two times a day (BID) | ORAL | Status: DC
  Administered 2017-03-28 – 2017-03-30 (×4): 1000 mg via ORAL
  Filled 2017-03-28 (×4): qty 2

## 2017-03-28 MED ORDER — GABAPENTIN 300 MG PO CAPS: 300.0000 mg | ORAL_CAPSULE | Freq: Every day | ORAL | Status: DC | PRN

## 2017-03-28 MED ORDER — SODIUM CHLORIDE 0.9 % IV BOLUS (SEPSIS)
250.0000 mL | Freq: Once | INTRAVENOUS | Status: AC
  Administered 2017-03-28: 250 mL via INTRAVENOUS

## 2017-03-28 MED ORDER — ACETAMINOPHEN 325 MG PO TABS: 650.0000 mg | ORAL_TABLET | Freq: Four times a day (QID) | ORAL | Status: DC | PRN

## 2017-03-28 MED ORDER — SODIUM CHLORIDE 0.9 % IV SOLN
INTRAVENOUS | Status: DC
  Administered 2017-03-28 – 2017-03-29 (×2): via INTRAVENOUS

## 2017-03-28 MED ORDER — VANCOMYCIN HCL 10 G IV SOLR
1250.0000 mg | Freq: Two times a day (BID) | INTRAVENOUS | Status: DC
  Administered 2017-03-28 – 2017-03-30 (×4): 1250 mg via INTRAVENOUS
  Filled 2017-03-28 (×6): qty 1250

## 2017-03-28 MED ORDER — INSULIN ASPART 100 UNIT/ML ~~LOC~~ SOLN: 0.0000 [IU] | Freq: Every day | SUBCUTANEOUS | Status: DC

## 2017-03-28 MED ORDER — ACETAMINOPHEN 650 MG RE SUPP: 650.0000 mg | Freq: Four times a day (QID) | RECTAL | Status: DC | PRN

## 2017-03-28 MED ORDER — MELOXICAM 7.5 MG PO TABS
7.5000 mg | ORAL_TABLET | Freq: Every day | ORAL | Status: DC
  Administered 2017-03-29 – 2017-03-30 (×2): 7.5 mg via ORAL
  Filled 2017-03-28 (×2): qty 1

## 2017-03-28 MED ORDER — ASPIRIN EC 81 MG PO TBEC
81.0000 mg | DELAYED_RELEASE_TABLET | Freq: Every day | ORAL | Status: DC
Start: 2017-03-29 — End: 2017-03-30
  Administered 2017-03-29 – 2017-03-30 (×2): 81 mg via ORAL
  Filled 2017-03-28 (×2): qty 1

## 2017-03-28 MED ORDER — PANTOPRAZOLE SODIUM 40 MG PO TBEC
40.0000 mg | DELAYED_RELEASE_TABLET | Freq: Every day | ORAL | Status: DC
  Administered 2017-03-29 – 2017-03-30 (×2): 40 mg via ORAL
  Filled 2017-03-28 (×2): qty 1

## 2017-03-28 MED ORDER — ENOXAPARIN SODIUM 40 MG/0.4ML ~~LOC~~ SOLN
40.0000 mg | SUBCUTANEOUS | Status: DC
  Administered 2017-03-28 – 2017-03-29 (×2): 40 mg via SUBCUTANEOUS
  Filled 2017-03-28 (×2): qty 0.4

## 2017-03-28 NOTE — H&P (Signed)
TRH H&P   Patient Demographics:    [name], is a 46 y.o. female  MRN: [id number]   DOB - 08-02-1971  Admit Date - 03/28/2017  Outpatient Primary MD for the patient is Rogers Blocker, MD  Referring MD/NP/PA: Ronalee Red  Outpatient Specialists:   [facility]  Patient coming from: home  Chief Complaint  Patient presents with  . Wound Infection      HPI:    [name]  is a 46 y.o. female, w Dm2, apparent with recent admission for right thigh necrotizing fascitis s/p I and D 7/18 (Dr. Johney Maine), apparently was complaining of more pain and induration of the right thigh wound and therefore presented to ED.  Pt denies fever, chills, .apparently she has had difficulty packing the wound at home.    In ED, Wbc 12.7, Hgb 12.3, Plt 462.     Pt was seen by Dr. Marlou Starks who requested admiission by medicine til her friend can be taught how to pack the wound properly.      Review of systems:    In addition to the HPI above,  No Fever-chills, No Headache, No changes with Vision or hearing, No problems swallowing food or Liquids, No Chest pain, Cough or Shortness of Breath, No Abdominal pain, No Nausea or Vommitting, Bowel movements are regular, No Blood in stool or Urine, No dysuria,  No new joints pains-aches,  No new weakness, tingling, numbness in any extremity, No recent weight gain or loss, No polyuria, polydypsia or polyphagia, No significant Mental Stressors.  A full 10 point Review of Systems was done, except as stated above, all other Review of Systems were negative.   With Past History of the following :    Past Medical History:  Diagnosis Date  . Arthritis    fingers  . Depression   . Diabetes mellitus   . Dyspnea 11/22/2013  . Hypertension   . Kidney stones   . PONV (postoperative nausea and vomiting)   . Sleep apnea    lost 70lbs no cpap x31yr now      Past  Surgical History:  Procedure Laterality Date  . CARDIAC CATHETERIZATION     4-543yrago  . CHOLECYSTECTOMY    . ENDOMETRIAL ABLATION     6 years ago  . IRRIGATION AND DEBRIDEMENT ABSCESS Right 03/17/2017   Procedure: IRRIGATION AND DEBRIDEMENT RIGHT THIGH ABSCESS;  Surgeon: GrMichael BostonMD;  Location: [facility];  Service: General;  Laterality: Right;  . LAPAROSCOPIC APPENDECTOMY  10/04/2011   Procedure: APPENDECTOMY LAPAROSCOPIC;  Surgeon: BrJudieth KeensDO;  Location: [facility];  Service: General;  Laterality: N/A;      Social History:     Social History  Substance Use Topics  . Smoking status: Current Every Day Smoker    Packs/day: 1.00    Years: 24.00    Types: Cigarettes  . Smokeless tobacco: Never  Used  . Alcohol use Yes     Comment: seldom     Lives - at home  Mobility - walks by self   Family History :     Family History  Problem Relation Age of Onset  . Aneurysm Mother   . Heart attack Father   . Stroke Father   . Breast cancer Paternal Grandmother       Home Medications:   Prior to Admission medications   Medication Sig Start Date End Date Taking? Authorizing Provider  acetaminophen (TYLENOL) 500 MG tablet Take 1-2 tablets (500-1,000 mg total) by mouth every 8 (eight) hours as needed for mild pain or moderate pain. Not to exceed 4,047m in 24 hour period 03/21/17  Yes CDessa PhiChahn-Yang, DO  aspirin EC 81 MG tablet Take 81 mg by mouth daily.    Yes [provider]  bisacodyl (DULCOLAX) 5 MG EC tablet Take 1 tablet (5 mg total) by mouth daily. 03/21/17 04/04/17 Yes CDessa PhiChahn-Yang, DO  esomeprazole (NEXIUM) 40 MG capsule Take 40 mg by mouth daily at 12 noon.   Yes [provider]  FLUoxetine (PROZAC) 40 MG capsule Take 40 mg by mouth daily.   Yes [provider]  gabapentin (NEURONTIN) 300 MG capsule Take 300 mg by mouth daily as needed (pain).    Yes [provider]  insulin aspart (NOVOLOG) 100 UNIT/ML  injection Novolog is your short-acting correction insulin. Check your blood sugar prior to your meal. Then depending on your blood sugar, inject insulin as follows: CBG 121 - 150: 3 units CBG 151 - 200: 4 units CBG 201 - 250: 7 units CBG 251 - 300: 11 units CBG 301 - 350: 15 units CBG 351 - 400: 20 units 03/21/17 03/21/18 Yes Choi, JAnderson MaltaChahn-Yang, DO  insulin aspart protamine- aspart (NOVOLOG MIX 70/30) (70-30) 100 UNIT/ML injection Inject 0.25 mLs (25 Units total) into the skin 2 (two) times daily with a meal. 03/21/17  Yes CDessa PhiChahn-Yang, DO  lisinopril (PRINIVIL,ZESTRIL) 20 MG tablet Take 20 mg by mouth daily. 01/04/17  Yes [provider]  meloxicam (MOBIC) 7.5 MG tablet Take 7.5 mg by mouth daily. 03/17/17  Yes [provider]  metFORMIN (GLUMETZA) 1000 MG (MOD) 24 hr tablet Take 1 tablet (1,000 mg total) by mouth 2 (two) times daily with a meal. 03/21/17  Yes CDessa PhiChahn-Yang, DO  oxyCODONE (OXY IR/ROXICODONE) 5 MG immediate release tablet Take 1-2 tablets (5-10 mg total) by mouth every 6 (six) hours as needed for moderate pain, severe pain or breakthrough pain (with dressing changes). 03/21/17  Yes CDessa PhiChahn-Yang, DO  psyllium (HYDROCIL/METAMUCIL) 95 % PACK Take 1 packet by mouth 2 (two) times daily. 03/21/17  Yes CDessa PhiChahn-Yang, DO  blood glucose meter kit and supplies Dispense based on patient and insurance preference. Use up to four times daily as directed. (FOR ICD-9 250.00, 250.01). 03/21/17   CDessa PhiChahn-Yang, DO  Insulin Syringe-Needle U-100 (INSULIN SYRINGE .3CC/31GX5/16") 31G X 5/16" 0.3 ML MISC Use with insulin, up to 5 times daily 03/21/17   CDessa PhiChahn-Yang, DO     Allergies:     Allergies  Allergen Reactions  . Codeine Itching  . Sulfa Antibiotics Hives     Physical Exam:   Vitals  Blood pressure 91/77, pulse 78, temperature 98 F (36.7 C), temperature source Oral, resp. rate 16, SpO2 98  %.   1. General  lying in bed in NAD,  2. Normal affect and insight, Not Suicidal or Homicidal, Awake Alert, Oriented X 3.  3. No F.N deficits, ALL C.Nerves Intact, Strength 5/5 all 4 extremities, Sensation intact all 4 extremities, Plantars down going.  4. Ears and Eyes appear Normal, Conjunctivae clear, PERRLA. Moist Oral Mucosa.  5. Supple Neck, No JVD, No cervical lymphadenopathy appriciated, No Carotid Bruits.  6. Symmetrical Chest wall movement, Good air movement bilaterally, CTAB.  7. RRR, No Gallops, Rubs or Murmurs, No Parasternal Heave.  8. Positive Bowel Sounds, Abdomen Soft, No tenderness, No organomegaly appriciated,No rebound -guarding or rigidity.  9.  No Cyanosis,there is slight redness and induration at the base of the wound.    10. Good muscle tone,  joints appear normal , no effusions, Normal ROM.  11. No Palpable Lymph Nodes in Neck or Axillae     Data Review:    CBC  Recent Labs Lab 03/28/17 1032  WBC 12.7*  HGB 12.3  HCT 37.0  PLT 462*  MCV 88.5  MCH 29.4  MCHC 33.2  RDW 12.6  LYMPHSABS 3.9  MONOABS 0.6  EOSABS 0.0  BASOSABS 0.0   ------------------------------------------------------------------------------------------------------------------  Chemistries   Recent Labs Lab 03/28/17 1032  NA 137  K 4.3  CL 104  CO2 24  GLUCOSE 66  BUN 18  CREATININE 0.98  CALCIUM 9.3   ------------------------------------------------------------------------------------------------------------------ estimated creatinine clearance is 86.3 mL/min (by C-G formula based on SCr of 0.98 mg/dL). ------------------------------------------------------------------------------------------------------------------ No results for input(s): TSH, T4TOTAL, T3FREE, THYROIDAB in the last 72 hours.  Invalid input(s): FREET3  Coagulation profile No results for input(s): INR, PROTIME in the last 168  hours. ------------------------------------------------------------------------------------------------------------------- No results for input(s): DDIMER in the last 72 hours. -------------------------------------------------------------------------------------------------------------------  Cardiac Enzymes No results for input(s): CKMB, TROPONINI, MYOGLOBIN in the last 168 hours.  Invalid input(s): CK ------------------------------------------------------------------------------------------------------------------    Component Value Date/Time   BNP 17.5 11/19/2014 1304     ---------------------------------------------------------------------------------------------------------------  Urinalysis    Component Value Date/Time   COLORURINE YELLOW 12/12/2013 1608   APPEARANCEUR CLEAR 12/12/2013 1608   LABSPEC 1.018 12/12/2013 1608   PHURINE 5.0 12/12/2013 1608   GLUCOSEU >1000 (A) 12/12/2013 1608   HGBUR NEGATIVE 12/12/2013 1608   BILIRUBINUR NEGATIVE 12/12/2013 1608   KETONESUR NEGATIVE 12/12/2013 1608   PROTEINUR NEGATIVE 12/12/2013 1608   UROBILINOGEN 0.2 12/12/2013 1608   NITRITE NEGATIVE 12/12/2013 1608   LEUKOCYTESUR NEGATIVE 12/12/2013 1608    ----------------------------------------------------------------------------------------------------------------   Imaging Results:    No results found.    Assessment & Plan:    Principal Problem:   Wound infection Active Problems:   Uncontrolled type 2 diabetes mellitus with hyperosmolar nonketotic hyperglycemia (HCC)   Necrotizing fasciitis right thigh s/p I&D 03/17/2017    Necrotizing fascitis right thigh s/p I and D  vanco iv pharmacy to dose unasyn iv pharmacy to dose Surgery consult called by ED per [place] Wound care consult  Dm2 fsbs ac and qhs, iss Continue metformin  Depression Cont prozac    DVT Prophylaxis Heparin -  Lovenox - SCDs   AM Labs Ordered, also please review Full Orders  Family  Communication: Admission, patients condition and plan of care including tests being ordered have been discussed with the patient  who indicate understanding and agree with the plan and Code Status.  Code Status FULL CODE  Likely DC to  home  Condition GUARDED    Consults called: surgery by ED  Admission status: observation  Time spent in minutes : 45 minutes   Andres Ege.D  on 03/28/2017 at 11:47 AM  Between 7am to 7pm - Pager - [phone]. After 7pm go to [email or web address] - password [facility]  [facility] - Office  [phone]

## 2017-03-28 NOTE — ED Notes (Signed)
Her open incision at right inner thigh is packed with gauze and is clean in appearance with minimal erythema surrounding wound area.

## 2017-03-28 NOTE — Consult Note (Signed)
WOC Nurse wound consult note Reason for Consult: CCS Surgeon orders WOC Nurse for teaching patient/family how to pack wound/perform wound care.  As this falls within the scope of the Bedside RN, WOC Nurse has ordered this as a Care Order/Instruction for Bedside RN-whether in ED or on Floor. WOC Nurse did not see today. WOC nursing team will not follow, but will remain available to this patient, the nursing, surgical and medical teams.  Please re-consult if needed. Thanks, Ladona Mow, MSN, RN, GNP, [facility] [place]  Pager# [phone]

## 2017-03-28 NOTE — Progress Notes (Addendum)
Pharmacy Antibiotic Note  [name] is a 46 y.o. female admitted on 03/28/2017 for wound evaluation.  Pharmacy has been consulted for Unasyn dosing. Admit for wound care, no cellulitis or drainage Prev admit 7/15 for R thigh abscess, Zosyn > Augmentin I&D 7/18 Cx: normal flora DM poor control, cannot afford insulin, last Hgb A1c on 7/16 was 15%  Plan: Unasyn 3gm q6hr Vancomycin 1gm x1, then 1250mg  q12 Daily serum Creatinine Wound care    Temp (24hrs), Avg:98 F (36.7 C), Min:98 F (36.7 C), Max:98 F (36.7 C)   Recent Labs Lab 03/28/17 1032  WBC 12.7*  CREATININE 0.98    Estimated Creatinine Clearance: 86.3 mL/min (by C-G formula based on SCr of 0.98 mg/dL).    Allergies  Allergen Reactions  . Codeine Itching  . Sulfa Antibiotics Hives   Antimicrobials this admission: 7/29 Unasyn >>  7/29 Vancomycin >>  Microbiology results: None ordered at this time  Thank you for allowing pharmacy to be a part of this patient's care.  8/29 PharmD Pager [phone] 03/28/2017, 12:02 PM

## 2017-03-28 NOTE — ED Provider Notes (Signed)
[facility] DEPT Provider Note   CSN: [id number] Arrival date & time: 03/28/17  0913     History   Chief Complaint Chief Complaint  Patient presents with  . Wound Infection    HPI [name] is a 46 y.o. female.  HPI 46 year old female with a history of diabetes who recently had I&D of right inner thigh myofasciitis who was discharged home on Augmentin and is currently performing wet-to-dry dressings once a day presents to the emergency department for worsening right inner thigh pain with notable induration for the past several days. Pain is worsened with palpation of the region. [name] denies any erythema or discharge. Denies any fevers, chills. Denies any other physical complaints. No other alleviating or aggravating factors.  Of note patient has follow-up appointment with [place] [facility] in 4 days.   Past Medical History:  Diagnosis Date  . Arthritis    fingers  . Depression   . Diabetes mellitus   . Dyspnea 11/22/2013  . Hypertension   . Kidney stones   . PONV (postoperative nausea and vomiting)   . Sleep apnea    lost 70lbs no cpap x89yr now    Patient Active Problem List   Diagnosis Date Noted  . Necrotizing fasciitis right thigh s/p I&D 03/17/2017 03/18/2017  . Cellulitis of leg, right   . Hyperglycemia   . Cellulitis 03/14/2017  . Hyponatremia 03/14/2017  . OSA (obstructive sleep apnea) 11/22/2013  . Dyspnea 11/22/2013  . Uncontrolled type 2 diabetes mellitus with hyperosmolar nonketotic hyperglycemia ([place] 07/21/2012  . HTN (hypertension) 07/21/2012  . Smoker 07/21/2012    Past Surgical History:  Procedure Laterality Date  . CARDIAC CATHETERIZATION     4-548yrago  . CHOLECYSTECTOMY    . ENDOMETRIAL ABLATION     6 years ago  . IRRIGATION AND DEBRIDEMENT ABSCESS Right 03/17/2017   Procedure: IRRIGATION AND DEBRIDEMENT RIGHT THIGH ABSCESS;  Surgeon: GrMichael BostonMD;  Location: [facility];  Service: General;  Laterality: Right;  .  LAPAROSCOPIC APPENDECTOMY  10/04/2011   Procedure: APPENDECTOMY LAPAROSCOPIC;  Surgeon: BrJudieth KeensDO;  Location: [facility];  Service: General;  Laterality: N/A;    OB History    No data available       Home Medications    Prior to Admission medications   Medication Sig Start Date End Date Taking? Authorizing Provider  acetaminophen (TYLENOL) 500 MG tablet Take 1-2 tablets (500-1,000 mg total) by mouth every 8 (eight) hours as needed for mild pain or moderate pain. Not to exceed 4,00039mn 24 hour period 03/21/17  Yes ChoDessa Phiahn-Yang, DO  aspirin EC 81 MG tablet Take 81 mg by mouth daily.    Yes [provider]  bisacodyl (DULCOLAX) 5 MG EC tablet Take 1 tablet (5 mg total) by mouth daily. 03/21/17 04/04/17 Yes ChoDessa Phiahn-Yang, DO  esomeprazole (NEXIUM) 40 MG capsule Take 40 mg by mouth daily at 12 noon.   Yes [provider]  FLUoxetine (PROZAC) 40 MG capsule Take 40 mg by mouth daily.   Yes [provider]  gabapentin (NEURONTIN) 300 MG capsule Take 300 mg by mouth daily as needed (pain).    Yes [provider]  insulin aspart (NOVOLOG) 100 UNIT/ML injection Novolog is your short-acting correction insulin. Check your blood sugar prior to your meal. Then depending on your blood sugar, inject insulin as follows: CBG 121 - 150: 3 units CBG 151 - 200: 4 units CBG 201 - 250: 7 units CBG  251 - 300: 11 units CBG 301 - 350: 15 units CBG 351 - 400: 20 units 03/21/17 03/21/18 Yes Choi, Anderson Malta Chahn-Yang, DO  insulin aspart protamine- aspart (NOVOLOG MIX 70/30) (70-30) 100 UNIT/ML injection Inject 0.25 mLs (25 Units total) into the skin 2 (two) times daily with a meal. 03/21/17  Yes Dessa Phi Chahn-Yang, DO  lisinopril (PRINIVIL,ZESTRIL) 20 MG tablet Take 20 mg by mouth daily. 01/04/17  Yes [provider]  meloxicam (MOBIC) 7.5 MG tablet Take 7.5 mg by mouth daily. 03/17/17  Yes [provider]  metFORMIN (GLUMETZA) 1000  MG (MOD) 24 hr tablet Take 1 tablet (1,000 mg total) by mouth 2 (two) times daily with a meal. 03/21/17  Yes Dessa Phi Chahn-Yang, DO  oxyCODONE (OXY IR/ROXICODONE) 5 MG immediate release tablet Take 1-2 tablets (5-10 mg total) by mouth every 6 (six) hours as needed for moderate pain, severe pain or breakthrough pain (with dressing changes). 03/21/17  Yes Dessa Phi Chahn-Yang, DO  psyllium (HYDROCIL/METAMUCIL) 95 % PACK Take 1 packet by mouth 2 (two) times daily. 03/21/17  Yes Dessa Phi Chahn-Yang, DO  blood glucose meter kit and supplies Dispense based on patient and insurance preference. Use up to four times daily as directed. (FOR ICD-9 250.00, 250.01). 03/21/17   Dessa Phi Chahn-Yang, DO  Insulin Syringe-Needle U-100 (INSULIN SYRINGE .3CC/31GX5/16") 31G X 5/16" 0.3 ML MISC Use with insulin, up to 5 times daily 03/21/17   Shon Millet, DO    Family History Family History  Problem Relation Age of Onset  . Aneurysm Mother   . Heart attack Father   . Stroke Father   . Breast cancer Paternal Grandmother     Social History Social History  Substance Use Topics  . Smoking status: Current Every Day Smoker    Packs/day: 1.00    Years: 24.00    Types: Cigarettes  . Smokeless tobacco: Never Used  . Alcohol use Yes     Comment: seldom     Allergies   Codeine and Sulfa antibiotics   Review of Systems Review of Systems All other systems are reviewed and are negative for acute change except as noted in the HPI   Physical Exam Updated Vital Signs BP 91/77 (BP Location: Right Arm)   Pulse 78   Temp 98 F (36.7 C) (Oral)   Resp 16   SpO2 98%   Physical Exam  Constitutional: [name] is oriented to person, place, and time. [name] appears well-developed and well-nourished. No distress.  HENT:  Head: Normocephalic and atraumatic.  Right Ear: External ear normal.  Left Ear: External ear normal.  Nose: Nose normal.  Eyes: Conjunctivae and EOM are normal. No  scleral icterus.  Neck: Normal range of motion and phonation normal.  Cardiovascular: Normal rate and regular rhythm.   Pulmonary/Chest: Effort normal. No stridor. No respiratory distress.  Abdominal: [name] exhibits no distension.  Musculoskeletal: Normal range of motion. [name] exhibits no edema.       Right upper leg: [name] exhibits tenderness.       Legs: Neurological: [name] is alert and oriented to person, place, and time.  Skin: [name] is not diaphoretic.  Psychiatric: [name] has a normal mood and affect. Her behavior is normal.  Vitals reviewed.    ED Treatments / Results  Labs (all labs ordered are listed, but only abnormal results are displayed) Labs Reviewed  CBC WITH DIFFERENTIAL/PLATELET - Abnormal; Notable for the following:       Result Value   WBC 12.7 (*)  Platelets 462 (*)    Neutro Abs 8.2 (*)    All other components within normal limits  BASIC METABOLIC PANEL    EKG  EKG Interpretation None       Radiology No results found.  Procedures Procedures (including critical care time)  Medications Ordered in ED Medications  fentaNYL (SUBLIMAZE) injection 50 mcg (50 mcg Intravenous Given 03/28/17 1052)     Initial Impression / Assessment and Plan / ED Course  I have reviewed the triage vital signs and the nursing notes.  Pertinent labs & imaging results that were available during my care of the patient were reviewed by me and considered in my medical decision making (see chart for details).     Wound is well-appearing without evidence of infection. Bedside ultrasound performed revealed mild fluid duration. Dr. Marlou Starks from [place] surgery evaluated the patient in the emergency department and probed the wound. Note that the wound had not been contact appropriately at home. Agreed that the wound did not appear infected. Recommended admission for appropriate wound care and education.   Discussed case with and medicine for admission.  Final Clinical Impressions(s) / ED  Diagnoses   Final diagnoses:  Encounter for postoperative wound care     Zhi Geier, Grayce Sessions, MD 03/28/17 1127

## 2017-03-28 NOTE — Consult Note (Signed)
WOC Nurse wound consult note Reason for Consult: Bedside RN requested consultation regarding wound care. Dry gauze inserted this am by Dr. Carolynne Edouard.  Hydrotherapy to begin tomorrow.  Nurse (who is a Wound Treatment Associate, Certified) (WTA-C) instructed to remove dry dressing, cleanse wound, measure(document measurements and redress wound using saline moistened Kerlix. Patient tolerated procedure well as reported by bedside RN.  Suggest hydrotherapy be performed daily in am and Nursing perform wound care in the pm (twice daily) while in house.  If CCS team agrees, please order. Wound type: Infectious, surgical  WOC nursing team will not follow, but will remain available to this patient, the nursing and medical teams.  Please re-consult if needed. Thanks, Ladona Mow, MSN, RN, GNP, [facility] [place]  Pager# [phone]

## 2017-03-28 NOTE — ED Notes (Signed)
Dr. Carolynne Edouard is seeing her as I write this.

## 2017-03-28 NOTE — Consult Note (Addendum)
Reason for Consult:wound Referring Physician: Dr. Fredderick Erb is an 46 y.o. female.  HPI:  The patient is a 46 year old white female who is about 11 days status post incision and drainage of a large right thigh abscess. She has had some increased pain at the area locally. She denies any fevers or chills. She is having difficulty getting the wound packed adequately at home. She came to the emergency department for evaluation of the wound.  Past Medical History:  Diagnosis Date  . Arthritis    fingers  . Depression   . Diabetes mellitus   . Dyspnea 11/22/2013  . Hypertension   . Kidney stones   . PONV (postoperative nausea and vomiting)   . Sleep apnea    lost 70lbs no cpap x33yr now    Past Surgical History:  Procedure Laterality Date  . CARDIAC CATHETERIZATION     4-574yrago  . CHOLECYSTECTOMY    . ENDOMETRIAL ABLATION     6 years ago  . IRRIGATION AND DEBRIDEMENT ABSCESS Right 03/17/2017   Procedure: IRRIGATION AND DEBRIDEMENT RIGHT THIGH ABSCESS;  Surgeon: GrMichael BostonMD;  Location: [facility];  Service: General;  Laterality: Right;  . LAPAROSCOPIC APPENDECTOMY  10/04/2011   Procedure: APPENDECTOMY LAPAROSCOPIC;  Surgeon: BrJudieth KeensDO;  Location: [facility];  Service: General;  Laterality: N/A;    Family History  Problem Relation Age of Onset  . Aneurysm Mother   . Heart attack Father   . Stroke Father   . Breast cancer Paternal Grandmother     Social History:  reports that she has been smoking Cigarettes.  She has a 24.00 pack-year smoking history. She has never used smokeless tobacco. She reports that she drinks alcohol. She reports that she does not use drugs.  Allergies:  Allergies  Allergen Reactions  . Codeine Itching  . Sulfa Antibiotics Hives    Medications: I have reviewed the patient's current medications.  Results for orders placed or performed during the hospital encounter of 03/28/17 (from the past 48 hour(s))  CBC with  Differential/Platelet     Status: Abnormal   Collection Time: 03/28/17 10:32 AM  Result Value Ref Range   WBC 12.7 (H) 4.0 - 10.5 K/uL   RBC 4.18 3.87 - 5.11 MIL/uL   Hemoglobin 12.3 12.0 - 15.0 g/dL   HCT 37.0 36.0 - 46.0 %   MCV 88.5 78.0 - 100.0 fL   MCH 29.4 26.0 - 34.0 pg   MCHC 33.2 30.0 - 36.0 g/dL   RDW 12.6 11.5 - 15.5 %   Platelets 462 (H) 150 - 400 K/uL   Neutrophils Relative % 65 %   Neutro Abs 8.2 (H) 1.7 - 7.7 K/uL   Lymphocytes Relative 31 %   Lymphs Abs 3.9 0.7 - 4.0 K/uL   Monocytes Relative 4 %   Monocytes Absolute 0.6 0.1 - 1.0 K/uL   Eosinophils Relative 0 %   Eosinophils Absolute 0.0 0.0 - 0.7 K/uL   Basophils Relative 0 %   Basophils Absolute 0.0 0.0 - 0.1 K/uL  Basic metabolic panel     Status: None   Collection Time: 03/28/17 10:32 AM  Result Value Ref Range   Sodium 137 135 - 145 mmol/L   Potassium 4.3 3.5 - 5.1 mmol/L   Chloride 104 101 - 111 mmol/L   CO2 24 22 - 32 mmol/L   Glucose, Bld 66 65 - 99 mg/dL   BUN 18 6 - 20 mg/dL   Creatinine,  Ser 0.98 0.44 - 1.00 mg/dL   Calcium 9.3 8.9 - 10.3 mg/dL   GFR calc non Af Amer >60 >60 mL/min   GFR calc Af Amer >60 >60 mL/min    Comment: (NOTE) The eGFR has been calculated using the CKD EPI equation. This calculation has not been validated in all clinical situations. eGFR's persistently <60 mL/min signify possible Chronic Kidney Disease.    Anion gap 9 5 - 15    No results found.  Review of Systems  Constitutional: Negative.   HENT: Negative.   Eyes: Negative.   Respiratory: Negative.   Cardiovascular: Negative.   Gastrointestinal: Negative.   Genitourinary: Negative.   Musculoskeletal: Negative.   Skin: Negative.   Neurological: Negative.   Endo/Heme/Allergies: Negative.   Psychiatric/Behavioral: Negative.    Blood pressure 91/77, pulse 78, temperature 98 F (36.7 C), temperature source Oral, resp. rate 16, SpO2 98 %. Physical Exam  Constitutional: She is oriented to person, place, and  time. She appears well-developed and well-nourished. No distress.  HENT:  Head: Normocephalic and atraumatic.  Mouth/Throat: No oropharyngeal exudate.  Eyes: Pupils are equal, round, and reactive to light. Conjunctivae and EOM are normal.  Neck: Normal range of motion. Neck supple.  Cardiovascular: Normal rate, regular rhythm and normal heart sounds.   Respiratory: Effort normal and breath sounds normal. No stridor. No respiratory distress.  GI: Soft. Bowel sounds are normal. There is no tenderness.  Musculoskeletal: Normal range of motion. She exhibits no edema.  There is a large open wound on the upper inner right thigh. The wound was probed to open some undermining pockets and packed with kerlix. No cellulitis  Neurological: She is alert and oriented to person, place, and time. Coordination normal.  Skin: Skin is warm and dry. No erythema.  Psychiatric: She has a normal mood and affect. Her behavior is normal. Thought content normal.    Assessment/Plan:  The patient appears to have a large open wound on the right upper inner thigh. There appeared to be some undermining that had not been packed. I was able to open these planes with blunt finger dissection and repacked the wound with Kerlix gauze. There was no cellulitis or purulent drainage. I think she may need to come in to the hospital for a couple days of better wound care. If she does we will plan to continue to follow her. If she does not come in to the hospital and she has an appointment with our office for a wound check middle of this week. If she is admitted now would recommend consulting physical therapy for some hydrotherapy to the wound  TOTH III,Karita Dralle S 03/28/2017, 11:09 AM

## 2017-03-28 NOTE — ED Triage Notes (Signed)
Pt had I&D to R inner thigh at [facility] 10 days ago, patient states wound care has been performed as directed. Yesterday patient noticed 2 pockets of fluid to R thigh and area became inflamed and painful. Pt states drainage has not changed since I&D, pt denies increase in change and reports mild serosanguinous drainage on dressings. Denies fevers. Pt states she completed her d/c antiobiotic 03/26/2017.

## 2017-03-29 DIAGNOSIS — M726 Necrotizing fasciitis: Secondary | ICD-10-CM

## 2017-03-29 DIAGNOSIS — Z4889 Encounter for other specified surgical aftercare: Secondary | ICD-10-CM

## 2017-03-29 LAB — CBC
HCT: 34.8 % — ABNORMAL LOW (ref 36.0–46.0)
Hemoglobin: 11.3 g/dL — ABNORMAL LOW (ref 12.0–15.0)
MCH: 28.8 pg (ref 26.0–34.0)
MCHC: 32.5 g/dL (ref 30.0–36.0)
MCV: 88.8 fL (ref 78.0–100.0)
Platelets: 408 10*3/uL — ABNORMAL HIGH (ref 150–400)
RBC: 3.92 MIL/uL (ref 3.87–5.11)
RDW: 12.7 % (ref 11.5–15.5)
WBC: 10.6 10*3/uL — ABNORMAL HIGH (ref 4.0–10.5)

## 2017-03-29 LAB — COMPREHENSIVE METABOLIC PANEL
ALT: 13 U/L — ABNORMAL LOW (ref 14–54)
AST: 11 U/L — ABNORMAL LOW (ref 15–41)
Albumin: 2.7 g/dL — ABNORMAL LOW (ref 3.5–5.0)
Alkaline Phosphatase: 189 U/L — ABNORMAL HIGH (ref 38–126)
Anion gap: 8 (ref 5–15)
BUN: 16 mg/dL (ref 6–20)
CO2: 26 mmol/L (ref 22–32)
Calcium: 8.7 mg/dL — ABNORMAL LOW (ref 8.9–10.3)
Chloride: 102 mmol/L (ref 101–111)
Creatinine, Ser: 0.93 mg/dL (ref 0.44–1.00)
GFR calc Af Amer: >60 mL/min (ref >60)
GFR calc non Af Amer: >60 mL/min (ref >60)
Glucose, Bld: 175 mg/dL — ABNORMAL HIGH (ref 65–99)
Potassium: 4.4 mmol/L (ref 3.5–5.1)
Sodium: 136 mmol/L (ref 135–145)
Total Bilirubin: 0.7 mg/dL (ref 0.3–1.2)
Total Protein: 6.1 g/dL — ABNORMAL LOW (ref 6.5–8.1)

## 2017-03-29 LAB — GLUCOSE, CAPILLARY
Glucose-Capillary: 139 mg/dL — ABNORMAL HIGH (ref 65–99)
Glucose-Capillary: 131 mg/dL — ABNORMAL HIGH (ref 65–99)
Glucose-Capillary: 127 mg/dL — ABNORMAL HIGH (ref 65–99)
Glucose-Capillary: 169 mg/dL — ABNORMAL HIGH (ref 65–99)

## 2017-03-29 NOTE — Progress Notes (Signed)
HYDROTHERAPY EVALUATION    03/29/17 1300  Subjective Assessment  Subjective "Im scared"  Patient and Family Stated Goals for wound to heal. less pain  Date of Onset (unknown. S/P I&D 7/18)  Evaluation and Treatment  Evaluation and Treatment Procedures Explained to Patient/Family Yes  Evaluation and Treatment Procedures agreed to  Wound / Incision (Open or Dehisced) 03/29/17 Incision - Open Thigh Right;Upper;Medial *PT* S/P I&D 7/18  Date First Assessed/Time First Assessed: 03/29/17 1140   Wound Type: Incision - Open  Location: Thigh  Location Orientation: Right;Upper;Medial  Wound Description (Comments): *PT* S/P I&D 7/18  Present on Admission: Yes  Dressing Type Moist to moist;Abdominal binder (Saline Kerlix packing; Ace wrap(pt preference) to hold )  Dressing Changed Changed  Dressing Status Old drainage  Dressing Change Frequency Twice a day  Site / Wound Assessment Brown;Yellow;Painful;Pink  % Wound base Red or Granulating 90%  % Wound base Yellow/Fibrinous Exudate 10%  Peri-wound Assessment Induration;Edema;Erythema (blanchable)  Wound Length (cm) 3 cm  Wound Width (cm) 10 cm  Wound Depth (cm) 6 cm  Tunneling (cm) (10:00-9cm; 1:00-5cm)  Margins Unattached edges (unapproximated)  Drainage Amount Moderate  Drainage Description Serosanguineous  Non-staged Wound Description Not applicable  Treatment Packing (Saline gauze);Hydrotherapy (Pulse lavage);Debridement (Selective)  Hydrotherapy  Pulsed lavage therapy - wound location R upper inner thigh  Pulsed Lavage with Suction (psi) 8 psi  Pulsed Lavage with Suction - Normal Saline Used 1000 mL  Pulsed Lavage Tip Tip with splash shield  Selective Debridement  Selective Debridement - Location R upper inner thigh  Selective Debridement - Tools Used Forceps;Scissors  Selective Debridement - Tissue Removed yellow/brown tissue  Wound Therapy - Assess/Plan/Recommendations  Wound Therapy - Clinical Statement 46 yo female admitted with  R thigh abscess-possibly necrotizing fasciitis. S/P I&D 03/17/17. Readmitted for wound care. Surgery following.  Factors Delaying/Impairing Wound Healing Diabetes Mellitus;Infection - systemic/local  Hydrotherapy Plan Debridement;Dressing change;Pulsatile lavage with suction;Patient/family education  Wound Therapy - Frequency 6X / week  Wound Therapy - Current Recommendations Case manager/social work  Wound Therapy - Follow Up Recommendations Home health RN  Wound Plan Facilitate wound healing with hydrotherapy and dressing changes  Wound Therapy Goals - Improve the function of patient's integumentary system by progressing the wound(s) through the phases of wound healing by:  Decrease Necrotic Tissue to 0  Decrease Necrotic Tissue - Progress Goal set today  Increase Granulation Tissue to 100  Increase Granulation Tissue - Progress Goal set today  Improve Drainage Characteristics Min  Improve Drainage Characteristics - Progress Goal set today  Goals/treatment plan/discharge plan were made with and agreed upon by patient/family Yes  Time For Goal Achievement 2 weeks  Wound Therapy - Potential for Goals Good   Rebeca Alert, MPT [phone]

## 2017-03-29 NOTE — Progress Notes (Signed)
PROGRESS NOTE  [name]  ZDG:[id number] DOB: [birth year] DOA: 03/28/2017 PCP: Gwenyth Bender, MD   Brief Narrative: [name] is a 46 y.o. female with a history of IDT2DM and recent admission for necrotizigin fasciitis who returned 7/29 for worsening pain and induration in right thigh wound. She initially had a small boil in the medial right thigh which underwent I&D in an ED in [place]. She was discharged on clindamycin, but wound worsened prompting her to visit ED 7/15. She was admitted for necrotizing fasciitis, underwent operative I&D 7/18 by Dr. Michaell Cowing and was discharged on 7/22. Due to no insurance coverage, no home health RN was arranged for wound changes at discharge. She had a friend performing daily packing and dressing changes, and took augmentin but noted worsening pain. On arrival she was afebrile with leukocytosis. General surgery evaluated her in the ED, noting significant undermining that had not been packed, recommending admission for wound care and education. She has started with hydrotherapy.  Assessment & Plan: Principal Problem:   Wound infection Active Problems:   Uncontrolled type 2 diabetes mellitus with hyperosmolar nonketotic hyperglycemia (HCC)   Necrotizing fasciitis right thigh s/p I&D 03/17/2017  Necrotizing fascitis right thigh s/p I&D 7/18 by Dr. Michaell Cowing: Returned for pain and insufficient wound care at home.  - On IV antibiotics. Leukocytosis improving.  - Appreciate general surgery recommendations. Will need ~ 3x / wk hydrotherapy and daily packing/dressing changes. I've discussed this with care management who will attempt to arrange this as an outpatient. Family friend to demonstrate competence in packing changes prior to discharge for other days.   IDT2DM: Very poorly controlled (HbA1c 15%) chronically, currently at inpatient goal. - Continue SSI and metformin. Holding 70/30 home insulin and CBGs in 100's - Continue lisinopril, BPs  normotensive  Depression: Chronic, stable - Continue fluoxetine  DVT prophylaxis: Lovenox Code Status: Full Family Communication: None at bedside Disposition Plan: Anticipate DC to home in the next 24 hrs.   Consultants:   General surgery  Procedures:   Hydrotherapy 7/30  Antimicrobials:  Vancomycin and unasyn   Subjective: Pain with packing changes is significant and only controlled with IV analgesics. No fevers.   Objective: Vitals:   03/28/17 1813 03/28/17 2144 03/29/17 0633 03/29/17 1308  BP: 98/61 107/69 116/72 103/66  Pulse: 64 74 (!) 58 70  Resp: 16 16 16 16   Temp: 98.3 F (36.8 C) 99 F (37.2 C) 98.8 F (37.1 C) 98.6 F (37 C)  TempSrc: Oral Oral Oral Oral  SpO2: 100% 100% 100% 100%  Weight:      Height:        Intake/Output Summary (Last 24 hours) at 03/29/17 1454 Last data filed at 03/29/17 1217  Gross per 24 hour  Intake           2737.5 ml  Output             2100 ml  Net            637.5 ml   Filed Weights   03/28/17 1346  Weight: 92.1 kg (203 lb)    Examination: General exam: 46 y.o. female in no distress Respiratory system: Non-labored breathing room air. Clear to auscultation bilaterally.  Cardiovascular system: Regular rate and rhythm. No murmur, rub, or gallop. No JVD, and no pedal edema. Gastrointestinal system: Abdomen soft, non-tender, non-distended, with normoactive bowel sounds. No organomegaly or masses felt. Central nervous system: Alert and oriented. No focal neurological deficits. Extremities: Warm, no deformities  Skin: Large right anteromedial upper thigh wound without significant surrounding erythema or purulence.  Psychiatry: Judgement and insight appear normal. Mood & affect appropriate.   Data Reviewed: I have personally reviewed following labs and imaging studies  CBC:  Recent Labs Lab 03/28/17 1032 03/29/17 0514  WBC 12.7* 10.6*  NEUTROABS 8.2*  --   HGB 12.3 11.3*  HCT 37.0 34.8*  MCV 88.5 88.8  PLT 462*  408*   Basic Metabolic Panel:  Recent Labs Lab 03/28/17 1032 03/29/17 0514  NA 137 136  K 4.3 4.4  CL 104 102  CO2 24 26  GLUCOSE 66 175*  BUN 18 16  CREATININE 0.98 0.93  CALCIUM 9.3 8.7*   GFR: Estimated Creatinine Clearance: 91 mL/min (by C-G formula based on SCr of 0.93 mg/dL). Liver Function Tests:  Recent Labs Lab 03/29/17 0514  AST 11*  ALT 13*  ALKPHOS 189*  BILITOT 0.7  PROT 6.1*  ALBUMIN 2.7*   No results for input(s): LIPASE, AMYLASE in the last 168 hours. No results for input(s): AMMONIA in the last 168 hours. Coagulation Profile: No results for input(s): INR, PROTIME in the last 168 hours. Cardiac Enzymes: No results for input(s): CKTOTAL, CKMB, CKMBINDEX, TROPONINI in the last 168 hours. BNP (last 3 results) No results for input(s): PROBNP in the last 8760 hours. HbA1C: No results for input(s): HGBA1C in the last 72 hours. CBG:  Recent Labs Lab 03/28/17 1307 03/28/17 1706 03/28/17 2142 03/29/17 0730 03/29/17 1216  GLUCAP 113* 123* 122* 139* 131*   Lipid Profile: No results for input(s): CHOL, HDL, LDLCALC, TRIG, CHOLHDL, LDLDIRECT in the last 72 hours. Thyroid Function Tests: No results for input(s): TSH, T4TOTAL, FREET4, T3FREE, THYROIDAB in the last 72 hours. Anemia Panel: No results for input(s): VITAMINB12, FOLATE, FERRITIN, TIBC, IRON, RETICCTPCT in the last 72 hours. Urine analysis:    Component Value Date/Time   COLORURINE YELLOW 12/12/2013 1608   APPEARANCEUR CLEAR 12/12/2013 1608   LABSPEC 1.018 12/12/2013 1608   PHURINE 5.0 12/12/2013 1608   GLUCOSEU >1000 (A) 12/12/2013 1608   HGBUR NEGATIVE 12/12/2013 1608   BILIRUBINUR NEGATIVE 12/12/2013 1608   KETONESUR NEGATIVE 12/12/2013 1608   PROTEINUR NEGATIVE 12/12/2013 1608   UROBILINOGEN 0.2 12/12/2013 1608   NITRITE NEGATIVE 12/12/2013 1608   LEUKOCYTESUR NEGATIVE 12/12/2013 1608   No results found for this or any previous visit (from the past 240 hour(s)).    Radiology  Studies: No results found.  Scheduled Meds: . aspirin EC  81 mg Oral Daily  . enoxaparin (LOVENOX) injection  40 mg Subcutaneous Q24H  . FLUoxetine  40 mg Oral Daily  . insulin aspart  0-5 Units Subcutaneous QHS  . insulin aspart  0-9 Units Subcutaneous TID WC  . lisinopril  20 mg Oral Daily  . meloxicam  7.5 mg Oral Daily  . metFORMIN  1,000 mg Oral BID WC  . pantoprazole  40 mg Oral Daily  . pneumococcal 23 valent vaccine  0.5 mL Intramuscular Tomorrow-1000  . sodium chloride flush  3 mL Intravenous Q12H   Continuous Infusions: . sodium chloride    . sodium chloride 75 mL/hr at 03/29/17 0826  . ampicillin-sulbactam (UNASYN) IV 3 g (03/29/17 1228)  . vancomycin Stopped (03/29/17 1001)     LOS: 0 days   Time spent: 25 minutes.  Hazeline Junker, MD [facility] Pager [phone]  If 7PM-7AM, please contact night-coverage [email or web address] Password [email or web address] 03/29/2017, 2:54 PM

## 2017-03-29 NOTE — Progress Notes (Signed)
Patient ID: [name], female   DOB: 23-Apr-1971, 46 y.o.   MRN: [id number]  [facility] Surgery Progress Note     Subjective: CC- right thigh wound Patient states that she is feeling a little better today. Pain well controlled. Tolerating dressing changes. Seen by WOC yesterday who is also recommending hydrotherapy. WBC is trending down, 10.6.  Objective: Vital signs in last 24 hours: Temp:  [98 F (36.7 C)-99 F (37.2 C)] 98.8 F (37.1 C) (07/30 9924) Pulse Rate:  [58-75] 58 (07/30 0633) Resp:  [16] 16 (07/30 0633) BP: (86-116)/(52-72) 116/72 (07/30 2683) SpO2:  [99 %-100 %] 100 % (07/30 4196) Weight:  [203 lb (92.1 kg)] 203 lb (92.1 kg) (07/29 1346)    Intake/Output from previous day: 07/29 0701 - 07/30 0700 In: 1500.5 [P.O.:240; I.V.:1060.5; IV Piggyback:200] Out: 1600 [Urine:1600] Intake/Output this shift: Total I/O In: 240 [P.O.:240] Out: -   PE: Gen:  Alert, NAD, pleasant HEENT: EOM's intact, pupils equal and round Card:  RRR, no M/G/R heard Pulm:  CTAB, no W/R/R, effort normal Abd: Soft, NT/ND, +BS Psych: A&Ox3  Right thigh: wound with red/pink tissue and some yellow exudate, no active drainage or surrounding erythema, no foul odor     Lab Results:   Recent Labs  03/28/17 1032 03/29/17 0514  WBC 12.7* 10.6*  HGB 12.3 11.3*  HCT 37.0 34.8*  PLT 462* 408*   BMET  Recent Labs  03/28/17 1032 03/29/17 0514  NA 137 136  K 4.3 4.4  CL 104 102  CO2 24 26  GLUCOSE 66 175*  BUN 18 16  CREATININE 0.98 0.93  CALCIUM 9.3 8.7*   PT/INR No results for input(s): LABPROT, INR in the last 72 hours. CMP     Component Value Date/Time   NA 136 03/29/2017 0514   K 4.4 03/29/2017 0514   CL 102 03/29/2017 0514   CO2 26 03/29/2017 0514   GLUCOSE 175 (H) 03/29/2017 0514   BUN 16 03/29/2017 0514   CREATININE 0.93 03/29/2017 0514   CALCIUM 8.7 (L) 03/29/2017 0514   PROT 6.1 (L) 03/29/2017 0514   ALBUMIN 2.7 (L) 03/29/2017 0514   AST 11 (L)  03/29/2017 0514   ALT 13 (L) 03/29/2017 0514   ALKPHOS 189 (H) 03/29/2017 0514   BILITOT 0.7 03/29/2017 0514   GFRNONAA >60 03/29/2017 0514   GFRAA >60 03/29/2017 0514   Lipase     Component Value Date/Time   LIPASE 194 (H) 11/19/2014 1304       Studies/Results: No results found.  Anti-infectives: Anti-infectives    Start     Dose/Rate Route Frequency Ordered Stop   03/28/17 2000  vancomycin (VANCOCIN) 1,250 mg in sodium chloride 0.9 % 250 mL IVPB     1,250 mg 166.7 mL/hr over 90 Minutes Intravenous Every 12 hours 03/28/17 1217     03/28/17 1215  vancomycin (VANCOCIN) IVPB 1000 mg/200 mL premix     1,000 mg 200 mL/hr over 60 Minutes Intravenous  Once 03/28/17 1214 03/28/17 1501   03/28/17 1200  Ampicillin-Sulbactam (UNASYN) 3 g in sodium chloride 0.9 % 100 mL IVPB     3 g 200 mL/hr over 30 Minutes Intravenous Every 6 hours 03/28/17 1158         Assessment/Plan Uncontrolled type II diabetes with neuropathy A1C 15 Hypertension GERD Depression  Right thigh soft tissue infection, probable early necrotizing fasciitis S/p IRRIGATION AND DEBRIDEMENT RIGHT THIGH ABSCESS, 03/17/17, Dr. Karie Soda - POD 12 - discharged 7/22 and readmitted  7/29 - daily wet to dry dressing changes, to start hydrotherapy today - WBC is trending down, patient afebrile  ID - unasyn 7/29>>, vancomycin 7/29>> FEN - heart healthy diet VTE - SCDs, lovenox  Plan - consult PT for hydrotherapy. Continue daily wet to dry dressing changes and IV antibiotics. Patient has a friend that plans to help with dressing changes at home, she will come in some time during this admission so we can teach her how to properly pack the wound.   LOS: 0 days    Franne Forts , [facility] 03/29/2017, 10:20 AM Pager: [phone] Consults: [phone] Mon-Fri 7:00 am-4:30 pm Sat-Sun 7:00 am-11:30 am

## 2017-03-29 NOTE — Progress Notes (Signed)
   03/29/17 1416  PT Time Calculation  PT Start Time (ACUTE ONLY) 1140  PT Stop Time (ACUTE ONLY) 1211  PT Time Calculation (min) (ACUTE ONLY) 31 min  PT G-Codes **NOT FOR INPATIENT CLASS**  Functional Assessment Tool Used AM-PAC 6 Clicks Basic Mobility;Clinical judgement  Functional Limitation Other PT primary  Other PT Primary Current Status (M5465) CJ  Other PT Primary Goal Status (K3546) CI  PT General Charges  $$ ACUTE PT VISIT 1 Visit  PT Evaluation  $PT Eval Moderate Complexity 1 Mod   [place], MPT [phone]

## 2017-03-29 NOTE — Progress Notes (Signed)
Spoke with patient at bedside. Patient states she lives at home with her son who is disabled, he is 48 yo but has the mental capacity of a toddler. She has caregivers that assist with him at home but is not willing/able to leave him for a SNF stay. She has a friend who has been assisting with wound care at home and would be willing to continue to assist with the support of home health. Contacted [employer] for charity application, contacted [facility] for possible LOG to assist. Financial Counselors have seen patient and plan to assist with Medicaid application.

## 2017-03-30 LAB — GLUCOSE, CAPILLARY
Glucose-Capillary: 173 mg/dL — ABNORMAL HIGH (ref 65–99)
Glucose-Capillary: 160 mg/dL — ABNORMAL HIGH (ref 65–99)

## 2017-03-30 LAB — CBC
HCT: 34.9 % — ABNORMAL LOW (ref 36.0–46.0)
Hemoglobin: 11.4 g/dL — ABNORMAL LOW (ref 12.0–15.0)
MCH: 28.7 pg (ref 26.0–34.0)
MCHC: 32.7 g/dL (ref 30.0–36.0)
MCV: 87.9 fL (ref 78.0–100.0)
Platelets: 406 10*3/uL — ABNORMAL HIGH (ref 150–400)
RBC: 3.97 MIL/uL (ref 3.87–5.11)
RDW: 12.4 % (ref 11.5–15.5)
WBC: 8.4 10*3/uL (ref 4.0–10.5)

## 2017-03-30 LAB — CREATININE, SERUM
Creatinine, Ser: 0.92 mg/dL (ref 0.44–1.00)
GFR calc Af Amer: >60 mL/min (ref >60)
GFR calc non Af Amer: >60 mL/min (ref >60)

## 2017-03-30 MED ORDER — OXYCODONE HCL 5 MG PO TABS
10.0000 mg | ORAL_TABLET | Freq: Four times a day (QID) | ORAL | Status: DC | PRN
  Administered 2017-03-30: 15 mg via ORAL
  Filled 2017-03-30: qty 3

## 2017-03-30 MED ORDER — OXYCODONE HCL 5 MG PO TABS: 10.0000 mg | ORAL_TABLET | Freq: Every day | ORAL | 0 refills | Status: DC | PRN

## 2017-03-30 NOTE — Progress Notes (Signed)
HYDROTHERAPY TREATMENT     03/30/17 1200  Subjective Assessment  Subjective "I got medicine. I feel good  Patient and Family Stated Goals for wound to heal. less pain  Evaluation and Treatment  Evaluation and Treatment Procedures Explained to Patient/Family Yes  Evaluation and Treatment Procedures agreed to  Wound / Incision (Open or Dehisced) 03/29/17 Incision - Open Thigh Right;Upper;Medial *PT* S/P I&D 7/18  Date First Assessed/Time First Assessed: 03/29/17 1140   Wound Type: Incision - Open  Location: Thigh  Location Orientation: Right;Upper;Medial  Wound Description (Comments): *PT* S/P I&D 7/18  Present on Admission: Yes  Dressing Type Moist to moist;Abdominal binder (Saline soaked Kerlix. Ace wrap to hold dressing in place)  Dressing Changed Changed  Dressing Status Old drainage  Dressing Change Frequency Twice a day  Site / Wound Assessment Brown;Yellow;Painful;Pink  % Wound base Red or Granulating 90%  % Wound base Yellow/Fibrinous Exudate 10%  Peri-wound Assessment Induration;Edema;Erythema   Margins Unattached edges (unapproximated)  Drainage Description Serosanguineous  Non-staged Wound Description Not applicable  Treatment Debridement (Selective);Hydrotherapy (Pulse lavage);Packing (Saline gauze)  Hydrotherapy  Pulsed lavage therapy - wound location R upper inner thigh  Pulsed Lavage with Suction (psi) 8 psi  Pulsed Lavage with Suction - Normal Saline Used 1000 mL  Pulsed Lavage Tip Tip with splash shield  Selective Debridement  Selective Debridement - Location R upper inner thigh  Selective Debridement - Tools Used Forceps;Scissors  Selective Debridement - Tissue Removed yellow/brown tissue  Wound Therapy - Assess/Plan/Recommendations  Wound Therapy - Clinical Statement 46 yo female admitted with R thigh abscess-possibly necrotizing fasciitis. S/P I&D 03/17/17. Readmitted for wound care. Surgery following.  7/31-WOCN left progress note with recommendation for Dakin's.  Spoke with RN about this-there wasn't an order for Dakin's placed. Proceeded with saline wet-to dry-dressing change after hydrotherapy. Used tunneling tip and diverter tip on today.  Factors Delaying/Impairing Wound Healing Diabetes Mellitus;Infection - systemic/local  Hydrotherapy Plan Debridement;Dressing change;Pulsatile lavage with suction;Patient/family education  Wound Therapy - Frequency 6X / week  Wound Therapy - Current Recommendations Case manager/social work  Wound Therapy - Follow Up Recommendations (per pt, she is set up for OP hydrotherapy)  Wound Plan Facilitate wound healing with hydrotherapy and dressing changes  Wound Therapy Goals - Improve the function of patient's integumentary system by progressing the wound(s) through the phases of wound healing by:  Decrease Necrotic Tissue - Progress Progressing toward goal  Increase Granulation Tissue - Progress Progressing toward goal  Improve Drainage Characteristics - Progress Progressing toward goal  Goals/treatment plan/discharge plan were made with and agreed upon by patient/family Yes  Time For Goal Achievement 2 weeks  Wound Therapy - Potential for Goals Good   Rebeca Alert, MPT [phone]

## 2017-03-30 NOTE — Consult Note (Signed)
WOC Nurse wound follow up Wound type:Infectious wound right thigh.  Necrotizing fasciitis.  Has received hydrotherapy inpatient and needs recommendations for home care.   Due to the infectious nature of this wound and the need for ongoing debreidement, WOC nurse would recommend the following:  Cleanse wound to right thigh with NS.  Gently fill wound depth with Dakin's moist gauze.  Cover with ABD pad and tape.  Change twice daily.  HH can provide the "recipe" and storage instructions to make the Dakin's solution for cost effective wound care options.   Will not follow at this time.  Please re-consult if needed.  Maple Hudson RN BSN CWON Pager [phone]

## 2017-03-30 NOTE — Discharge Summary (Signed)
Physician Discharge Summary  [name] [id number] DOB: [birth year] DOA: 03/28/2017  PCP: Rogers Blocker, MD  Admit date: 03/28/2017 Discharge date: 03/30/2017  Admitted From: Home Disposition: Home   Recommendations for Outpatient Follow-up:  1. Follow up with general surgery next week. 2. Continue hydrotherapy as outpatient three times per week and daily packing/dressing changes.  Home Health: HHRN uipment/Devices: None Discharge Condition: Stable CODE STATUS: Full Diet recommendation: Carbohydrate-limited  Brief/Interim Summary: [name] is a 46 y.o. female with a history of IDT2DM and recent admission for necrotizigin fasciitis who returned 7/29 for worsening pain and induration in right thigh wound. She initially had a small boil in the medial right thigh which underwent I&D in an ED in [place]. She was discharged on clindamycin, but wound worsened prompting her to visit ED 7/15. She was admitted for necrotizing fasciitis, underwent operative I&D 7/18 by Dr. Johney Maine and was discharged on 7/22. Due to no insurance coverage, no home health RN was arranged for wound changes at discharge. She had a friend performing daily packing and dressing changes, and took augmentin but noted worsening pain. On arrival she was afebrile with mild leukocytosis. General surgery evaluated her in the ED, noting significant undermining that had not been packed, recommending admission for wound care and education. She has started with hydrotherapy and friend who will pack wound daily has received daily training. Hydrotherapy/wound dressing changes to continue as outpatient.   Discharge Diagnoses:  Principal Problem:   Wound infection Active Problems:   Uncontrolled type 2 diabetes mellitus with hyperosmolar nonketotic hyperglycemia (HCC)   Necrotizing fasciitis right thigh s/p I&D 03/17/2017  Necrotizing fascitis right thigh s/p I&D 7/18 by Dr. Johney Maine: Returned for pain and insufficient wound  care at home.  - Was on IV antibiotics. Leukocytosis resolved. Discussed with general surgery who agrees that antibiotics are no longer indicated. Will not prescribe at discharge.  - Continue wound hydrotherapy 3x weekly as outpatient.  - Family friend has been trained and is able to change packing and dressing daily. Per WOC recommendations: "Cleanse wound to right thigh with NS. Gently fill wound depth with Dakin's moist gauze. Cover with ABD pad and tape." - Oxycodone as below for pain with dressing changes  IDT2DM: Very poorly controlled (HbA1c 15%) chronically, currently at inpatient goal. - Continue SSI and metformin. Holding 70/30 home insulin and CBGs at inpatient goals - Continue lisinopril, BPs normotensive.  Depression: Chronic, stable - Continue fluoxetine  Discharge Instructions Discharge Instructions    Ambulatory referral to Physical Therapy    Complete by:  As directed    Forestine Na for wound evaluation and treatment with hydrotherapy three times per week   Discharge instructions    Complete by:  As directed    Continue changing the packing and dressing of the wound daily. You will need to go to the wound care center three times per week for hydrotherapy. You may take oxycodone as directed for dressing changes. You can take up to 3 tablets, but should really try to limit this to 2 tablets if possible.  - Follow up with [facility] in the next week - Call if you experience fever or worsening pain/redness.     Allergies as of 03/30/2017      Reactions   Codeine Itching   Sulfa Antibiotics Hives      Medication List    TAKE these medications   acetaminophen 500 MG tablet Commonly known as:  TYLENOL Take 1-2 tablets (500-1,000  mg total) by mouth every 8 (eight) hours as needed for mild pain or moderate pain. Not to exceed 4,019m in 24 hour period   aspirin EC 81 MG tablet Take 81 mg by mouth daily.   bisacodyl 5 MG EC tablet Commonly known as:   DULCOLAX Take 1 tablet (5 mg total) by mouth daily. What changed:  when to take this  reasons to take this   blood glucose meter kit and supplies Dispense based on patient and insurance preference. Use up to four times daily as directed. (FOR ICD-9 250.00, 250.01).   esomeprazole 40 MG capsule Commonly known as:  NEXIUM Take 40 mg by mouth daily at 12 noon.   FLUoxetine 40 MG capsule Commonly known as:  PROZAC Take 40 mg by mouth daily.   gabapentin 300 MG capsule Commonly known as:  NEURONTIN Take 300 mg by mouth daily as needed (pain).   insulin aspart 100 UNIT/ML injection Commonly known as:  NOVOLOG Novolog is your short-acting correction insulin. Check your blood sugar prior to your meal. Then depending on your blood sugar, inject insulin as follows: CBG 121 - 150: 3 units CBG 151 - 200: 4 units CBG 201 - 250: 7 units CBG 251 - 300: 11 units CBG 301 - 350: 15 units CBG 351 - 400: 20 units   insulin aspart protamine- aspart (70-30) 100 UNIT/ML injection Commonly known as:  NOVOLOG MIX 70/30 Inject 0.25 mLs (25 Units total) into the skin 2 (two) times daily with a meal.   INSULIN SYRINGE .3CC/31GX5/16" 31G X 5/16" 0.3 ML Misc Use with insulin, up to 5 times daily   lisinopril 20 MG tablet Commonly known as:  PRINIVIL,ZESTRIL Take 20 mg by mouth daily.   meloxicam 7.5 MG tablet Commonly known as:  MOBIC Take 7.5 mg by mouth daily.   metFORMIN 1000 MG (MOD) 24 hr tablet Commonly known as:  GLUMETZA Take 1 tablet (1,000 mg total) by mouth 2 (two) times daily with a meal.   oxyCODONE 5 MG immediate release tablet Commonly known as:  Oxy IR/ROXICODONE Take 2-3 tablets (10-15 mg total) by mouth daily as needed (with dressing changes). What changed:  how much to take  when to take this  reasons to take this   psyllium 95 % Pack Commonly known as:  HYDROCIL/METAMUCIL Take 1 packet by mouth 2 (two) times daily. What changed:  when to take this  reasons to  take this      [place].   Why:  nurse to assist with wound care.  Contact information: [address].   Contact information: [phone]       GMichael Boston MD Follow up.   Specialty:  General Surgery Why:  Call for follow up in 2 weeks. Contact information: [address] [place] [phone]         Allergies  Allergen Reactions  . Codeine Itching  . Sulfa Antibiotics Hives    Consultations:  General surgery, Dr. GHarlow Asa Procedures/Studies: Dg Abd 1 View  Result Date: 03/20/2017 CLINICAL DATA:  RIGHT lower quadrant abdominal pain today, history of hypertension, diabetes mellitus, kidney stones, prior appendectomy EXAM: ABDOMEN - 1 VIEW COMPARISON:  CT abdomen and pelvis 10/04/2011 FINDINGS: Normal bowel gas pattern. No bowel  dilatation or bowel wall thickening. Scattered stool throughout colon most prominent at hepatic flexure. No urinary tract calcifications. Small pelvic phleboliths. Osseous structures unremarkable. IMPRESSION: No acute abnormalities Electronically Signed   By: Lavonia Dana M.D.   On: 03/20/2017 10:11   Mr Femur Right W Wo Contrast  Result Date: 03/16/2017 CLINICAL DATA:  Abscess of right thigh, right sided inner upper thigh. Redness swelling and heat. eval for extent of infection. Pt unable to keep legs together due to pain. EXAM: MRI OF THE RIGHT FEMUR WITHOUT AND WITH CONTRAST TECHNIQUE: Multiplanar, multisequence MR imaging of the left femur was performed both before and after administration of intravenous contrast. CONTRAST:  13m MULTIHANCE GADOBENATE DIMEGLUMINE 529 MG/ML IV SOLN COMPARISON:  None. FINDINGS: Bones/Joint/Cartilage No marrow signal abnormality. No fracture or dislocation. Normal alignment. No  joint effusion. Ligaments Collateral ligaments are intact. Muscles and Tendons Soft tissue edema in the of medial upper right thigh with enhancement on postcontrast imaging. Areas of non enhancement within the subcutaneous fat concerning for areas of necrosis measuring approximately 5.2 x 2.6 x 5.7 cm. Perifascial edema surrounding the superior aspect of the right gracilis muscle. Mild edema in the superior aspect of the right gracilis muscle. No intramuscular fluid collection or hematoma. Remainder the muscles are normal in signal.  No muscle atrophy. Soft tissue No other fluid collection or hematoma.  No soft tissue mass. IMPRESSION: Cellulitis of the upper medial right thigh with mild fasciitis involving the right upper gracilis muscle. Mild muscle edema in the upper right gracilis muscle likely reactive versus less likely secondary to mild myositis. Areas of non enhancement within the subcutaneous fat concerning for areas of necrosis. Electronically Signed   By: HKathreen Devoid  On: 03/16/2017 12:27   Subjective: No fevers, pain is severe with packing changes, improved with both po and IV analgesics, nervous about pain limiting good wound care as outpatient.   Discharge Exam: Vitals:   03/29/17 2112 03/30/17 0543  BP: 108/71 99/62  Pulse: 70 75  Resp: 16 15  Temp: 98.6 F (37 C) 98.1 F (36.7 C)  General: Pt is alert, awake, not in acute distress Cardiovascular: RRR, S1/S2 +, no rubs, no gallops Respiratory: CTA bilaterally, no wheezing, no rhonchi Abdominal: Soft, NT, ND, bowel sounds + Skin: Large right anteromedial upper thigh wound without significant surrounding erythema or purulence.   Labs: Basic Metabolic Panel:  Recent Labs Lab 03/28/17 1032 03/29/17 0514 03/30/17 0515  NA 137 136  --   K 4.3 4.4  --   CL 104 102  --   CO2 24 26  --   GLUCOSE 66 175*  --   BUN 18 16  --   CREATININE 0.98 0.93 0.92  CALCIUM 9.3 8.7*  --    Liver Function Tests:  Recent Labs Lab  03/29/17 0514  AST 11*  ALT 13*  ALKPHOS 189*  BILITOT 0.7  PROT 6.1*  ALBUMIN 2.7*   CBC:  Recent Labs Lab 03/28/17 1032 03/29/17 0514 03/30/17 0515  WBC 12.7* 10.6* 8.4  NEUTROABS 8.2*  --   --   HGB 12.3 11.3* 11.4*  HCT 37.0 34.8* 34.9*  MCV 88.5 88.8 87.9  PLT 462* 408* 406*   CBG:  Recent Labs Lab 03/29/17 1216 03/29/17 1639 03/29/17 2102 03/30/17 0743 03/30/17 1318  GLUCAP 131* 127* 169* 173* 160*   Time coordinating discharge: Approximately 40 minutes  RVance Gather MD  [facility] 03/30/2017, 3:07 PM Pager [phone]

## 2017-03-30 NOTE — Progress Notes (Signed)
Patient given discharge instructions, and verbalized an understanding of all discharge instructions.  Patient agrees with discharge plan, and is being discharged in stable medical condition.  

## 2017-03-30 NOTE — Progress Notes (Signed)
    CC:  Right thigh wound  Subjective: Pt known to our service and under went S/p IRRIGATION AND DEBRIDEMENT RIGHT THIGH ABSCESS, 03/17/17, Dr. Karie Soda.  A1c ws 15 at the time.  She also had no real help with the care at home.   1. Unfortunately unable to set up home health nursing for wound care as no agency is taking charity cases and patient unable to afford. We will set up for outpatient wound care center follow up. CM assisting and has spoken with patient.  2. Pt back with wound care issues.  She was suppose to follow up with wound care here at the Wound care center.   Objective: Vital signs in last 24 hours: Temp:  [98.1 F (36.7 C)-98.6 F (37 C)] 98.1 F (36.7 C) (07/31 0543) Pulse Rate:  [70-75] 75 (07/31 0543) Resp:  [15-16] 15 (07/31 0543) BP: (99-108)/(62-71) 99/62 (07/31 0543) SpO2:  [97 %-100 %] 97 % (07/31 0543) Weight:  [95 kg (209 lb 7 oz)] 95 kg (209 lb 7 oz) (07/31 0558) Last BM Date: 03/28/17  Intake/Output from previous day: 07/30 0701 - 07/31 0700 In: 1790 [P.O.:240; I.V.:300; IV Piggyback:1250] Out: 500 [Urine:500] Intake/Output this shift: Total I/O In: 360 [P.O.:360] Out: -   General appearance: alert, cooperative, no distress and knows what she needs to do. Picture from yesterday is below  Lab Results:   Recent Labs  03/29/17 0514 03/30/17 0515  WBC 10.6* 8.4  HGB 11.3* 11.4*  HCT 34.8* 34.9*  PLT 408* 406*    BMET  Recent Labs  03/28/17 1032 03/29/17 0514 03/30/17 0515  NA 137 136  --   K 4.3 4.4  --   CL 104 102  --   CO2 24 26  --   GLUCOSE 66 175*  --   BUN 18 16  --   CREATININE 0.98 0.93 0.92  CALCIUM 9.3 8.7*  --    PT/INR No results for input(s): LABPROT, INR in the last 72 hours.   Recent Labs Lab 03/29/17 0514  AST 11*  ALT 13*  ALKPHOS 189*  BILITOT 0.7  PROT 6.1*  ALBUMIN 2.7*     Lipase     Component Value Date/Time   LIPASE 194 (H) 11/19/2014 1304     Medications: . aspirin EC  81 mg Oral  Daily  . FLUoxetine  40 mg Oral Daily  . insulin aspart  0-5 Units Subcutaneous QHS  . insulin aspart  0-9 Units Subcutaneous TID WC  . lisinopril  20 mg Oral Daily  . meloxicam  7.5 mg Oral Daily  . metFORMIN  1,000 mg Oral BID WC  . pantoprazole  40 mg Oral Daily  . sodium chloride flush  3 mL Intravenous Q12H      Assessment/Plan Uncontrolled type II diabetes with neuropathy A1C 15 Hypertension GERD Depression  Right thigh soft tissue infection, probable early necrotizing fasciitis S/p IRRIGATION AND DEBRIDEMENT RIGHT THIGH ABSCESS, 03/17/17, Dr. Karie Soda - POD 12 - discharged 7/22 and readmitted 7/29 - daily wet to dry dressing changes, to start hydrotherapy today - WBC is trending down, patient afebrile  ID - unasyn 7/29>>, vancomycin 7/29>> FEN - heart healthy diet VTE - SCDs, lovenox    Plan:  Continue Hydro therapy and dressing changes, good glucose control.  She can go when adequate arrangement for outpatient care are complete.   LOS: 1 day    [name] 03/30/2017 [phone]

## 2017-03-30 NOTE — Progress Notes (Signed)
Patient ID: MARIEANN ZIPP, female   DOB: 21-Aug-1971, 47 y.o.   MRN: [id number]  General Surgery [facility]  Patient anticipating discharge home today.  Patient has an individual who has come to the hospital and been trained in wound care.  Also planning hydrotherapy as out-patient at [facility].  Will arrange follow up at [facility] office next week.  Velora Heckler, MD, [facility] Office: [phone]

## 2017-03-30 NOTE — Progress Notes (Signed)
Patient can get hydrotherapy as OP at [facility]. Working on timing and location of therapy (hospital vs OP facility). Discussed with attending to get orders, discussed with patient, she will make arrangements for transportation. Continuing to educate friend on wound care.

## 2017-04-01 ENCOUNTER — Encounter (HOSPITAL_COMMUNITY): Payer: Self-pay | Admitting: Physical Therapy

## 2017-04-01 ENCOUNTER — Ambulatory Visit (HOSPITAL_COMMUNITY): Payer: Medicaid Other | Admitting: Physical Therapy

## 2017-04-01 DIAGNOSIS — S71101S Unspecified open wound, right thigh, sequela: Secondary | ICD-10-CM

## 2017-04-01 DIAGNOSIS — M79604 Pain in right leg: Secondary | ICD-10-CM

## 2017-04-01 NOTE — Therapy (Signed)
[facility] [address] [place], [place], [address] Phone: [phone]   Fax:  [phone]  Wound Care Evaluation  Patient Details  Name: DESA RECH MRN: [id number] Date of Birth: [birth year] Referring Provider: Hazeline Junker  Encounter Date: 04/01/2017      PT End of Session - 04/01/17 0922    Visit Number 1   Number of Visits 13   Date for PT Re-Evaluation 04/29/17   Authorization Type Medicaid/Selfpay    Authorization Time Period 04/01/17 to 04/29/17   PT Start Time 0815   PT Stop Time 0900   PT Time Calculation (min) 45 min   Activity Tolerance Patient tolerated treatment well   Behavior During Therapy [facility] for tasks assessed/performed      Past Medical History:  Diagnosis Date  . Arthritis    fingers  . Depression   . Diabetes mellitus   . Dyspnea 11/22/2013  . Hypertension   . Kidney stones   . PONV (postoperative nausea and vomiting)   . Sleep apnea    lost 70lbs no cpap x59yrs now    Past Surgical History:  Procedure Laterality Date  . CARDIAC CATHETERIZATION     4-42yrs ago  . CHOLECYSTECTOMY    . ENDOMETRIAL ABLATION     6 years ago  . IRRIGATION AND DEBRIDEMENT ABSCESS Right 03/17/2017   Procedure: IRRIGATION AND DEBRIDEMENT RIGHT THIGH ABSCESS;  Surgeon: Karie Soda, MD;  Location: [facility]S;  Service: General;  Laterality: Right;  . LAPAROSCOPIC APPENDECTOMY  10/04/2011   Procedure: APPENDECTOMY LAPAROSCOPIC;  Surgeon: Rulon Abide, DO;  Location: [facility] ORS;  Service: General;  Laterality: N/A;    There were no vitals filed for this visit.        [facility] PT Assessment - 04/01/17 0001      Assessment   Medical Diagnosis necrotizing fasciitis    Referring Provider Hazeline Junker   Onset Date/Surgical Date --  mid July 2018   Next MD Visit Does not see Dr. Jarvis Newcomer again; [facility], PCP, and [place] wound center next week    Prior Therapy wound care in hosptial, self packing      Precautions   Precautions None     Balance Screen   Has the patient fallen in the past 6 months No   Has the patient had a decrease in activity level because of a fear of falling?  No   Is the patient reluctant to leave their home because of a fear of falling?  No     Prior Function   Level of Independence Independent;Independent with basic ADLs;Independent with gait;Independent with transfers   Vocation Unemployed         Wound Therapy - 04/01/17 0911    Subjective Patient went to a highschool reunion in mid-July where she discovered a boil on her inner thigh. She went to the ER and they opened it but did not drain it. That sunday she had cellulitis up and down her LE, she then went to [facility] and had surgery for necrotizing fasciitis infection before being sent home with packing. She had increasing pain and hardening around her wound, went back to the ED and had another surgery where they found and reopened channels/tunneling. She was then sent home again with packing and refered to this clinic for hydrotherapy.    Patient and Family Stated Goals wound to heal    Date of Onset --  mid-July 2018   Prior Treatments wound  care in hospital from PT, self packing    Pain Assessment 0-10   Pain Score 4    Pain Type Acute pain   Pain Location Leg   Pain Orientation Right;Medial   Pain Descriptors / Indicators Constant   Pain Onset On-going   Evaluation and Treatment Procedures Explained to Patient/Family Yes   Evaluation and Treatment Procedures agreed to   Wound Properties Date First Assessed: 04/01/17 Wound Type: Incision - Open Location: Leg Location Orientation: Right;Medial Wound Description (Comments): surgical debridement medial R thigh Present on Admission: Yes   Dressing Type Moist to dry;Gauze (Comment)   Dressing Changed Changed   Dressing Status Old drainage   Dressing Change Frequency Daily   % Wound base Red or Granulating 80%   % Wound base Yellow/Fibrinous Exudate 20%   Wound  Length (cm) 3 cm   Wound Width (cm) 10.5 cm   Wound Depth (cm) 6 cm  at worst    Tunneling (cm) approx 5cm at 10 and 5 cm approximately at 1    Closure None   Drainage Amount Copious   Drainage Description Serosanguineous;Sanguineous   Treatment Cleansed;Hydrotherapy (Pulse lavage);Debridement (Selective);Packing (Saline gauze);Pressure applied   Pulsed lavage therapy - wound location R upper inner thigh   Pulsed Lavage with Suction (psi) 8 psi   Pulsed Lavage with Suction - Normal Saline Used 1000 mL   Pulsed Lavage Tip Tip with splash shield   Selective Debridement - Location R upper inner thigh   Selective Debridement - Tools Used Forceps;Scissors   Selective Debridement - Tissue Removed yellow/brown tissue   Wound Therapy - Clinical Statement  Patient arrives after having experienced surgical debridement of R inner thigh; she reports that she has actually had 2 surgeries for this wound due to development of tunneling/channels out from the wound bed. She has a friend that has helped her pack this wound at home; she is also going to [facility] [place] [facility] and [place] [facility] to determine further POC. Performed pulsed lavage to wound today, followed by minor debridement of wound edges and packing with saline soaked gauze; wrapped this area with coban and netting. Recommend that unless patient is advised otherwise by surgery center or [place] wound center, she receive skilled PT services to facilitate wound healing moving forward. Recommend close monitoring of wound status/for resolution of tunneling for possible referral for wound vac pending wound status.    Wound Therapy - Functional Problem List surgical debridement wound    Factors Delaying/Impairing Wound Healing Diabetes Mellitus;Tobacco use   Hydrotherapy Plan Pulsatile lavage with suction;Debridement;Dressing change;Patient/family education   Wound Therapy - Frequency 3X / week   Wound Therapy - Current Recommendations PT;Surgery  consult   Wound Plan continue pulsed lavage and saline gauze packing; patient is going to surgery center and [place] wound center next week and they will tellh er if she has to continue here; monitor for appropriateness of wound vac (tunneling needs to be resolved)             Objective measurements completed on examination: See above findings.                PT Education - 04/01/17 [phone]    Education provided Yes   Education Details POC moving forward, continue daily dressing changes    Person(s) Educated Patient   Methods Explanation   Comprehension Verbalized understanding          PT Short Term Goals - 04/01/17 4403  PT SHORT TERM GOAL #1   Title Wound tunneling/channels to have resolved to show general improvement of wound and allow for improved healing of remained of wound bed    Time 2   Period Weeks   Status New   Target Date 04/15/17     PT SHORT TERM GOAL #2   Title Wound to demonstrate 100% granulation tissue and pain to be no more than 1/10 to show improvement of wound status    Time 2   Period Weeks   Status New           PT Long Term Goals - 04/01/17 6415      PT LONG TERM GOAL #1   Title Wound to be no more than 1cm in depth in order to allow patient to better care for remainder of wound bed    Time 4   Period Weeks   Status New   Target Date 04/29/17     PT LONG TERM GOAL #2   Title Wound width to be no more than 4cm and length to be no more than 1cm in order to allow patient to better care for remainder of wound bed    Time 4   Period Weeks   Status New     PT LONG TERM GOAL #3   Title Patient to demonstrate correct wound care dressing strategies as well as skin care strategies in order to fully allow her to care for remainder of wound    Time 4   Period Weeks   Status New            Patient will benefit from skilled therapeutic intervention in order to improve the following deficits and impairments:     Visit  Diagnosis: Open thigh wound, right, sequela - Plan: PT plan of care cert/re-cert  Pain in right leg - Plan: PT plan of care cert/re-cert    Problem List Patient Active Problem List   Diagnosis Date Noted  . Wound infection 03/28/2017  . Necrotizing fasciitis right thigh s/p I&D 03/17/2017 03/18/2017  . Cellulitis of leg, right   . Hyperglycemia   . Cellulitis 03/14/2017  . Hyponatremia 03/14/2017  . OSA (obstructive sleep apnea) 11/22/2013  . Dyspnea 11/22/2013  . Uncontrolled type 2 diabetes mellitus with hyperosmolar nonketotic hyperglycemia (HCC) 07/21/2012  . HTN (hypertension) 07/21/2012  . Smoker 07/21/2012    Nedra Hai PT, DPT [phone]  [facility] [address] [place], [place], [address] Phone: [phone]   Fax:  [phone]  Name: ALYIA BONNIN MRN: [id number] Date of Birth: [birth year]

## 2017-04-05 ENCOUNTER — Encounter (HOSPITAL_BASED_OUTPATIENT_CLINIC_OR_DEPARTMENT_OTHER): Payer: Medicaid Other | Attending: Internal Medicine

## 2017-04-05 ENCOUNTER — Ambulatory Visit (HOSPITAL_COMMUNITY): Payer: MEDICAID | Admitting: Physical Therapy

## 2017-04-05 ENCOUNTER — Telehealth (HOSPITAL_COMMUNITY): Payer: Self-pay | Admitting: Internal Medicine

## 2017-04-05 DIAGNOSIS — M069 Rheumatoid arthritis, unspecified: Secondary | ICD-10-CM | POA: Insufficient documentation

## 2017-04-05 DIAGNOSIS — Z794 Long term (current) use of insulin: Secondary | ICD-10-CM | POA: Insufficient documentation

## 2017-04-05 DIAGNOSIS — E1165 Type 2 diabetes mellitus with hyperglycemia: Secondary | ICD-10-CM | POA: Insufficient documentation

## 2017-04-05 DIAGNOSIS — L97113 Non-pressure chronic ulcer of right thigh with necrosis of muscle: Secondary | ICD-10-CM | POA: Insufficient documentation

## 2017-04-05 DIAGNOSIS — E1140 Type 2 diabetes mellitus with diabetic neuropathy, unspecified: Secondary | ICD-10-CM | POA: Insufficient documentation

## 2017-04-05 DIAGNOSIS — Z79899 Other long term (current) drug therapy: Secondary | ICD-10-CM | POA: Insufficient documentation

## 2017-04-05 DIAGNOSIS — Z79891 Long term (current) use of opiate analgesic: Secondary | ICD-10-CM | POA: Insufficient documentation

## 2017-04-05 DIAGNOSIS — Z7982 Long term (current) use of aspirin: Secondary | ICD-10-CM | POA: Insufficient documentation

## 2017-04-05 DIAGNOSIS — F17210 Nicotine dependence, cigarettes, uncomplicated: Secondary | ICD-10-CM | POA: Insufficient documentation

## 2017-04-05 DIAGNOSIS — G4730 Sleep apnea, unspecified: Secondary | ICD-10-CM | POA: Insufficient documentation

## 2017-04-05 DIAGNOSIS — Z9119 Patient's noncompliance with other medical treatment and regimen: Secondary | ICD-10-CM | POA: Insufficient documentation

## 2017-04-05 DIAGNOSIS — I10 Essential (primary) hypertension: Secondary | ICD-10-CM | POA: Insufficient documentation

## 2017-04-05 DIAGNOSIS — E11622 Type 2 diabetes mellitus with other skin ulcer: Secondary | ICD-10-CM | POA: Insufficient documentation

## 2017-04-05 NOTE — Telephone Encounter (Signed)
04/05/17  She saw a dr this morning and advised her to stop wound care here

## 2017-04-07 ENCOUNTER — Ambulatory Visit (HOSPITAL_COMMUNITY): Payer: MEDICAID | Admitting: Physical Therapy

## 2017-04-09 ENCOUNTER — Ambulatory Visit (HOSPITAL_COMMUNITY): Payer: MEDICAID | Admitting: Physical Therapy

## 2017-04-12 ENCOUNTER — Ambulatory Visit (HOSPITAL_COMMUNITY): Payer: MEDICAID | Admitting: Physical Therapy

## 2017-04-14 ENCOUNTER — Ambulatory Visit (HOSPITAL_COMMUNITY): Payer: Medicaid Other | Admitting: Physical Therapy

## 2017-04-16 ENCOUNTER — Ambulatory Visit (HOSPITAL_COMMUNITY): Payer: Medicaid Other | Admitting: Physical Therapy

## 2017-04-19 ENCOUNTER — Other Ambulatory Visit (HOSPITAL_COMMUNITY)
Admission: RE | Admit: 2017-04-19 | Discharge: 2017-04-19 | Disposition: A | Discharge status: Home / Self Care | Payer: Medicaid Other | Source: Other Acute Inpatient Hospital | Attending: Internal Medicine | Admitting: Internal Medicine

## 2017-04-19 ENCOUNTER — Ambulatory Visit (HOSPITAL_COMMUNITY): Payer: Medicaid Other | Admitting: Physical Therapy

## 2017-04-19 DIAGNOSIS — M726 Necrotizing fasciitis: Secondary | ICD-10-CM | POA: Insufficient documentation

## 2017-04-21 ENCOUNTER — Ambulatory Visit (HOSPITAL_COMMUNITY): Payer: Medicaid Other | Admitting: Physical Therapy

## 2017-04-23 ENCOUNTER — Ambulatory Visit (HOSPITAL_COMMUNITY): Payer: Medicaid Other | Admitting: Physical Therapy

## 2017-04-23 LAB — AEROBIC CULTURE W GRAM STAIN (SUPERFICIAL SPECIMEN)

## 2017-04-23 LAB — AEROBIC CULTURE  (SUPERFICIAL SPECIMEN): Culture: NORMAL

## 2017-04-26 ENCOUNTER — Ambulatory Visit (HOSPITAL_COMMUNITY): Payer: Medicaid Other | Admitting: Physical Therapy

## 2017-04-28 ENCOUNTER — Ambulatory Visit (HOSPITAL_COMMUNITY): Payer: Medicaid Other | Admitting: Physical Therapy

## 2017-04-30 ENCOUNTER — Ambulatory Visit (HOSPITAL_COMMUNITY): Payer: Medicaid Other | Admitting: Physical Therapy

## 2017-05-10 ENCOUNTER — Encounter (HOSPITAL_BASED_OUTPATIENT_CLINIC_OR_DEPARTMENT_OTHER): Payer: Self-pay | Attending: Internal Medicine

## 2017-05-10 DIAGNOSIS — E11622 Type 2 diabetes mellitus with other skin ulcer: Secondary | ICD-10-CM | POA: Insufficient documentation

## 2017-05-10 DIAGNOSIS — E1140 Type 2 diabetes mellitus with diabetic neuropathy, unspecified: Secondary | ICD-10-CM | POA: Insufficient documentation

## 2017-05-10 DIAGNOSIS — L97115 Non-pressure chronic ulcer of right thigh with muscle involvement without evidence of necrosis: Secondary | ICD-10-CM | POA: Insufficient documentation

## 2017-05-10 DIAGNOSIS — G4730 Sleep apnea, unspecified: Secondary | ICD-10-CM | POA: Insufficient documentation

## 2017-05-10 DIAGNOSIS — F17210 Nicotine dependence, cigarettes, uncomplicated: Secondary | ICD-10-CM | POA: Insufficient documentation

## 2017-05-10 DIAGNOSIS — I10 Essential (primary) hypertension: Secondary | ICD-10-CM | POA: Insufficient documentation

## 2017-05-31 ENCOUNTER — Encounter (HOSPITAL_BASED_OUTPATIENT_CLINIC_OR_DEPARTMENT_OTHER): Payer: Self-pay | Attending: Internal Medicine

## 2017-05-31 DIAGNOSIS — F17200 Nicotine dependence, unspecified, uncomplicated: Secondary | ICD-10-CM | POA: Insufficient documentation

## 2017-05-31 DIAGNOSIS — L97112 Non-pressure chronic ulcer of right thigh with fat layer exposed: Secondary | ICD-10-CM | POA: Insufficient documentation

## 2017-05-31 DIAGNOSIS — M069 Rheumatoid arthritis, unspecified: Secondary | ICD-10-CM | POA: Insufficient documentation

## 2017-05-31 DIAGNOSIS — E1140 Type 2 diabetes mellitus with diabetic neuropathy, unspecified: Secondary | ICD-10-CM | POA: Insufficient documentation

## 2017-05-31 DIAGNOSIS — G4730 Sleep apnea, unspecified: Secondary | ICD-10-CM | POA: Insufficient documentation

## 2017-05-31 DIAGNOSIS — I10 Essential (primary) hypertension: Secondary | ICD-10-CM | POA: Insufficient documentation

## 2017-05-31 DIAGNOSIS — E11622 Type 2 diabetes mellitus with other skin ulcer: Secondary | ICD-10-CM | POA: Insufficient documentation

## 2017-06-07 ENCOUNTER — Other Ambulatory Visit: Payer: Self-pay | Admitting: Internal Medicine

## 2017-06-07 ENCOUNTER — Ambulatory Visit (HOSPITAL_COMMUNITY)
Admission: RE | Admit: 2017-06-07 | Discharge: 2017-06-07 | Disposition: A | Discharge status: Home / Self Care | Payer: Self-pay | Source: Ambulatory Visit | Attending: Internal Medicine | Admitting: Internal Medicine

## 2017-06-07 ENCOUNTER — Other Ambulatory Visit (HOSPITAL_COMMUNITY)
Admission: RE | Admit: 2017-06-07 | Discharge: 2017-06-07 | Disposition: A | Discharge status: Home / Self Care | Payer: Self-pay | Source: Ambulatory Visit | Attending: Internal Medicine | Admitting: Internal Medicine

## 2017-06-07 DIAGNOSIS — M79604 Pain in right leg: Secondary | ICD-10-CM | POA: Insufficient documentation

## 2017-06-07 DIAGNOSIS — M79601 Pain in right arm: Secondary | ICD-10-CM

## 2017-06-07 DIAGNOSIS — M726 Necrotizing fasciitis: Secondary | ICD-10-CM | POA: Insufficient documentation

## 2017-06-07 LAB — CBC WITH DIFFERENTIAL/PLATELET
Basophils Absolute: 0 10*3/uL (ref 0.0–0.1)
Basophils Relative: 0 %
Eosinophils Absolute: 0.1 10*3/uL (ref 0.0–0.7)
Eosinophils Relative: 0 %
HCT: 42.7 % (ref 36.0–46.0)
Hemoglobin: 15 g/dL (ref 12.0–15.0)
Lymphocytes Relative: 40 %
Lymphs Abs: 4.6 10*3/uL — ABNORMAL HIGH (ref 0.7–4.0)
MCH: 29.5 pg (ref 26.0–34.0)
MCHC: 35.1 g/dL (ref 30.0–36.0)
MCV: 83.9 fL (ref 78.0–100.0)
Monocytes Absolute: 0.4 10*3/uL (ref 0.1–1.0)
Monocytes Relative: 3 %
Neutro Abs: 6.5 10*3/uL (ref 1.7–7.7)
Neutrophils Relative %: 57 %
Platelets: 228 10*3/uL (ref 150–400)
RBC: 5.09 MIL/uL (ref 3.87–5.11)
RDW: 12.9 % (ref 11.5–15.5)
WBC: 11.6 10*3/uL — ABNORMAL HIGH (ref 4.0–10.5)

## 2017-06-07 LAB — SEDIMENTATION RATE: Sed Rate: 21 mm/hr (ref 0–22)

## 2017-06-07 LAB — CK: Total CK: 51 U/L (ref 38–234)

## 2017-06-10 ENCOUNTER — Emergency Department (HOSPITAL_BASED_OUTPATIENT_CLINIC_OR_DEPARTMENT_OTHER)
Admission: EM | Admit: 2017-06-10 | Discharge: 2017-06-10 | Disposition: A | Discharge status: Home / Self Care | Payer: Self-pay | Source: Home / Self Care | Attending: Emergency Medicine | Admitting: Emergency Medicine

## 2017-06-10 ENCOUNTER — Emergency Department (HOSPITAL_COMMUNITY)
Admission: EM | Admit: 2017-06-10 | Discharge: 2017-06-10 | Disposition: A | Discharge status: Home / Self Care | Payer: Self-pay | Attending: Emergency Medicine | Admitting: Emergency Medicine

## 2017-06-10 ENCOUNTER — Emergency Department (HOSPITAL_COMMUNITY): Payer: Self-pay

## 2017-06-10 ENCOUNTER — Encounter (HOSPITAL_COMMUNITY): Payer: Self-pay | Admitting: Emergency Medicine

## 2017-06-10 DIAGNOSIS — M79609 Pain in unspecified limb: Secondary | ICD-10-CM

## 2017-06-10 DIAGNOSIS — I10 Essential (primary) hypertension: Secondary | ICD-10-CM | POA: Insufficient documentation

## 2017-06-10 DIAGNOSIS — E119 Type 2 diabetes mellitus without complications: Secondary | ICD-10-CM | POA: Insufficient documentation

## 2017-06-10 DIAGNOSIS — F17210 Nicotine dependence, cigarettes, uncomplicated: Secondary | ICD-10-CM | POA: Insufficient documentation

## 2017-06-10 DIAGNOSIS — Z7982 Long term (current) use of aspirin: Secondary | ICD-10-CM | POA: Insufficient documentation

## 2017-06-10 DIAGNOSIS — Z794 Long term (current) use of insulin: Secondary | ICD-10-CM | POA: Insufficient documentation

## 2017-06-10 DIAGNOSIS — N3000 Acute cystitis without hematuria: Secondary | ICD-10-CM | POA: Insufficient documentation

## 2017-06-10 DIAGNOSIS — Z79899 Other long term (current) drug therapy: Secondary | ICD-10-CM | POA: Insufficient documentation

## 2017-06-10 LAB — CBC WITH DIFFERENTIAL/PLATELET
Basophils Absolute: 0 10*3/uL (ref 0.0–0.1)
Basophils Relative: 0 %
Eosinophils Absolute: 0 10*3/uL (ref 0.0–0.7)
Eosinophils Relative: 0 %
HCT: 42.3 % (ref 36.0–46.0)
Hemoglobin: 14.5 g/dL (ref 12.0–15.0)
Lymphocytes Relative: 16 %
Lymphs Abs: 1.7 10*3/uL (ref 0.7–4.0)
MCH: 28.8 pg (ref 26.0–34.0)
MCHC: 34.3 g/dL (ref 30.0–36.0)
MCV: 84.1 fL (ref 78.0–100.0)
Monocytes Absolute: 0.6 10*3/uL (ref 0.1–1.0)
Monocytes Relative: 6 %
Neutro Abs: 8.4 10*3/uL — ABNORMAL HIGH (ref 1.7–7.7)
Neutrophils Relative %: 78 %
Platelets: 170 10*3/uL (ref 150–400)
RBC: 5.03 MIL/uL (ref 3.87–5.11)
RDW: 12.7 % (ref 11.5–15.5)
WBC: 10.8 10*3/uL — ABNORMAL HIGH (ref 4.0–10.5)

## 2017-06-10 LAB — COMPREHENSIVE METABOLIC PANEL
ALT: 38 U/L (ref 14–54)
AST: 39 U/L (ref 15–41)
Albumin: 3.8 g/dL (ref 3.5–5.0)
Alkaline Phosphatase: 123 U/L (ref 38–126)
Anion gap: 9 (ref 5–15)
BUN: 25 mg/dL — ABNORMAL HIGH (ref 6–20)
CO2: 28 mmol/L (ref 22–32)
Calcium: 9.4 mg/dL (ref 8.9–10.3)
Chloride: 94 mmol/L — ABNORMAL LOW (ref 101–111)
Creatinine, Ser: 1.25 mg/dL — ABNORMAL HIGH (ref 0.44–1.00)
GFR calc Af Amer: 58 mL/min — ABNORMAL LOW (ref >60)
GFR calc non Af Amer: 50 mL/min — ABNORMAL LOW (ref >60)
Glucose, Bld: 361 mg/dL — ABNORMAL HIGH (ref 65–99)
Potassium: 4.3 mmol/L (ref 3.5–5.1)
Sodium: 131 mmol/L — ABNORMAL LOW (ref 135–145)
Total Bilirubin: 0.5 mg/dL (ref 0.3–1.2)
Total Protein: 7.8 g/dL (ref 6.5–8.1)

## 2017-06-10 LAB — URINALYSIS, ROUTINE W REFLEX MICROSCOPIC
Bilirubin Urine: NEGATIVE
Glucose, UA: >=500 mg/dL — AB
Hgb urine dipstick: NEGATIVE
Ketones, ur: NEGATIVE mg/dL
Nitrite: POSITIVE — AB
Protein, ur: NEGATIVE mg/dL
Specific Gravity, Urine: 1.015 (ref 1.005–1.030)
pH: 5 (ref 5.0–8.0)

## 2017-06-10 LAB — LACTIC ACID, PLASMA: Lactic Acid, Venous: 1.2 mmol/L (ref 0.5–1.9)

## 2017-06-10 LAB — C-REACTIVE PROTEIN: CRP: 14.5 mg/dL — ABNORMAL HIGH (ref <1.0)

## 2017-06-10 LAB — CBG MONITORING, ED: Glucose-Capillary: 356 mg/dL — ABNORMAL HIGH (ref 65–99)

## 2017-06-10 MED ORDER — INSULIN ASPART 100 UNIT/ML ~~LOC~~ SOLN
5.0000 [IU] | Freq: Once | SUBCUTANEOUS | Status: AC
  Administered 2017-06-10: 5 [IU] via SUBCUTANEOUS
  Filled 2017-06-10: qty 1

## 2017-06-10 MED ORDER — NITROFURANTOIN MONOHYD MACRO 100 MG PO CAPS: 100.0000 mg | ORAL_CAPSULE | Freq: Two times a day (BID) | ORAL | 0 refills | Status: AC

## 2017-06-10 MED ORDER — DEXTROSE 5 % IV SOLN
1.0000 g | Freq: Once | INTRAVENOUS | Status: AC
  Administered 2017-06-10: 1 g via INTRAVENOUS
  Filled 2017-06-10: qty 10

## 2017-06-10 MED ORDER — SODIUM CHLORIDE 0.9 % IV BOLUS (SEPSIS)
1000.0000 mL | Freq: Once | INTRAVENOUS | Status: AC
  Administered 2017-06-10: 1000 mL via INTRAVENOUS

## 2017-06-10 MED ORDER — IOPAMIDOL (ISOVUE-300) INJECTION 61%
100.0000 mL | Freq: Once | INTRAVENOUS | Status: AC | PRN
  Administered 2017-06-10: 100 mL via INTRAVENOUS

## 2017-06-10 MED ORDER — IOPAMIDOL (ISOVUE-300) INJECTION 61%
INTRAVENOUS | Status: AC
  Filled 2017-06-10: qty 100

## 2017-06-10 NOTE — ED Provider Notes (Addendum)
Pt seen and evaluated. Discussed with Resident. Pt with Nec Fasc in July requiring fasciotomy of right thigh. Has had good wound closure. Has had leg pain distal to closed wound. Felt "chills ad hot" last night. Referred here as she states that she feels bumps in her distal thigh.  Afebrile here. UA appears infected. Ct thigh without ST gas.Pt in no pain, definitely not POOP to exam.  Negative doppler. LRINEC score 2/6.  Agree with Tx for UTI. Appropriate for outpatient treatment.    Rolland Porter, MD 06/10/17 1259    Rolland Porter, MD 06/10/17 1302

## 2017-06-10 NOTE — ED Notes (Signed)
ED Provider at bedside. 

## 2017-06-10 NOTE — Progress Notes (Signed)
*  PRELIMINARY RESULTS* Vascular Ultrasound Right lower ext venous duplex has been completed.  Preliminary findings: no evidence of DVT or SVT.    Farrel Demark, RDMS, RVT  06/10/2017, 11:12 AM

## 2017-06-10 NOTE — Discharge Instructions (Signed)
You were seen at [facility] ED for fever. Your blood tests were normal. Your urine test results are consistent with a urinary tract infection which can cause fever, decreased appetite, and generalized weakness. You received one dose of antibiotics while in ED. We gave you a prescription for the antibiotic Macrobid. Please take 1 tablet 2 times every day for 4 days starting tomorrow.  Take this medication with food. You can continue to use Tylenol for fever.

## 2017-06-10 NOTE — ED Triage Notes (Signed)
Pt sent from wound center for chills/fever and pain/knots to back of right leg; recent surgery for necrotizing fascitis.

## 2017-06-10 NOTE — ED Provider Notes (Signed)
[name] DEPT Provider Note   CSN: [id number] Arrival date & time: 06/10/17  0854    History   Chief Complaint Chief Complaint  Patient presents with  . Fever  . Leg Pain    HPI [name] is a 46 y.o. female with history of insulin-dependent T2DM, HTN, OSA, and recent R thigh necrotizing fasciitis s/p I&D 03/17/2017 who presents to the ED from wound center with worsening RLE, low grade fever x4 days, generalized weakness, and poor PO intake. She reports low grade fevers since Monday (10/8) with highest T 100.1. Has been taking Tylenol with resolution of fever. She also reports a "knot" behind her R knee that is new. Patient was seen at wound care facility this AM and reported mentioned complaints. She was sent to the ED due to concern for sepsis.  Recent R femur XR 10/8 with no abnormalities. VS stable, WBC 11.6 and ESR 21 at facility. She completed a course of Augmentin last week.   HPI  Past Medical History:  Diagnosis Date  . Arthritis    fingers  . Depression   . Diabetes mellitus   . Dyspnea 11/22/2013  . Hypertension   . Kidney stones   . PONV (postoperative nausea and vomiting)   . Sleep apnea    lost 70lbs no cpap x34yr now    Patient Active Problem List   Diagnosis Date Noted  . Wound infection 03/28/2017  . Necrotizing fasciitis right thigh s/p I&D 03/17/2017 03/18/2017  . Cellulitis of leg, right   . Hyperglycemia   . Cellulitis 03/14/2017  . Hyponatremia 03/14/2017  . OSA (obstructive sleep apnea) 11/22/2013  . Dyspnea 11/22/2013  . Uncontrolled type 2 diabetes mellitus with hyperosmolar [place]ketotic hyperglycemia [place] 07/21/2012  . HTN (hypertension) 07/21/2012  . Smoker 07/21/2012    Past Surgical History:  Procedure Laterality Date  . CARDIAC CATHETERIZATION     4-568yrago  . CHOLECYSTECTOMY    . ENDOMETRIAL ABLATION     6 years ago  . IRRIGATION AND DEBRIDEMENT ABSCESS Right 03/17/2017   Procedure: IRRIGATION AND DEBRIDEMENT RIGHT  THIGH ABSCESS;  Surgeon: GrMichael BostonMD;  Location: [facility];  Service: General;  Laterality: Right;  . LAPAROSCOPIC APPENDECTOMY  10/04/2011   Procedure: APPENDECTOMY LAPAROSCOPIC;  Surgeon: BrJudieth KeensDO;  Location: [facility];  Service: General;  Laterality: N/A;    OB History    No data available       Home Medications    Prior to Admission medications   Medication Sig Start Date End Date Taking? Authorizing Provider  acetaminophen (TYLENOL) 500 MG tablet Take 1-2 tablets (500-1,000 mg total) by mouth every 8 (eight) hours as needed for mild pain or moderate pain. Not to exceed 4,00061mn 24 hour period Patient taking differently: Take 500-1,500 mg by mouth every 8 (eight) hours as needed for mild pain or moderate pain. Not to exceed 4,000m30m 24 hour period 03/21/17  Yes ChoiDessa Phihn-Yang, DO  aspirin EC 81 MG tablet Take 81 mg by mouth daily.    Yes [provider]  blood glucose meter kit and supplies Dispense based on patient and insurance preference. Use up to four times daily as directed. (FOR ICD-9 250.00, 250.01). 03/21/17  Yes ChoiDessa Phihn-Yang, DO  esomeprazole (NEXIUM) 40 MG capsule Take 40 mg by mouth daily.    Yes [provider]  FLUoxetine (PROZAC) 40 MG capsule Take 40 mg by mouth daily.   Yes [provider]  gabapentin (  NEURONTIN) 300 MG capsule Take 300 mg by mouth daily as needed (pain).    Yes [provider]  hydrOXYzine (ATARAX/VISTARIL) 10 MG tablet Take 10 mg by mouth at bedtime as needed for sleep. 06/02/17  Yes [provider]  insulin aspart (NOVOLOG) 100 UNIT/ML injection Novolog is your short-acting correction insulin. Check your blood sugar prior to your meal. Then depending on your blood sugar, inject insulin as follows: CBG 121 - 150: 3 units CBG 151 - 200: 4 units CBG 201 - 250: 7 units CBG 251 - 300: 11 units CBG 301 - 350: 15 units CBG 351 - 400: 20 units Patient taking differently:  Inject 0-5 Units into the skin 3 (three) times daily with meals. Novolog is your short-acting correction insulin. Check your blood sugar prior to your meal. Then depending on your blood sugar, inject insulin as follows: CBG 121 - 150: 3 units CBG 151 - 200: 4 units CBG 201 - 250: 7 units CBG 251 - 300: 11 units CBG 301 - 350: 15 units CBG 351 - 400: 20 units 03/21/17 03/21/18 Yes Choi, Anderson [place] Chahn-Yang, DO  insulin aspart protamine- aspart (NOVOLOG MIX 70/30) (70-30) 100 UNIT/ML injection Inject 0.25 mLs (25 Units total) into the skin 2 (two) times daily with a meal. Patient taking differently: Inject 17 Units into the skin 2 (two) times daily with a meal.  03/21/17  Yes Dessa Phi Chahn-Yang, DO  Insulin Syringe-Needle U-100 (INSULIN SYRINGE .3CC/31GX5/16") 31G X 5/16" 0.3 ML MISC Use with insulin, up to 5 times daily 03/21/17  Yes Dessa Phi Chahn-Yang, DO  lisinopril (PRINIVIL,ZESTRIL) 20 MG tablet Take 20 mg by mouth daily. 01/04/17  Yes [provider]  LORazepam (ATIVAN) 1 MG tablet Take 1 mg by mouth 2 (two) times daily as needed for anxiety. 06/03/17  Yes [provider]  meloxicam (MOBIC) 7.5 MG tablet Take 7.5 mg by mouth daily. 03/17/17  Yes [provider]  metFORMIN (GLUCOPHAGE) 1000 MG tablet Take 1,000 mg by mouth 2 (two) times daily. 06/02/17  Yes [provider]  Multiple Vitamin (MULTIVITAMIN WITH MINERALS) TABS tablet Take 1 tablet by mouth daily.   Yes [provider]  nitrofurantoin, macrocrystal-monohydrate, (MACROBID) 100 MG capsule Take 1 capsule (100 mg total) by mouth 2 (two) times daily. Starting 10/12 06/10/17 06/17/17  Welford Roche, MD  oxyCODONE (OXY IR/ROXICODONE) 5 MG immediate release tablet Take 2-3 tablets (10-15 mg total) by mouth daily as needed (with dressing changes). Patient not taking: Reported on 06/10/2017 03/30/17   Patrecia Pour, MD  psyllium (HYDROCIL/METAMUCIL) 95 % PACK Take 1 packet by mouth 2  (two) times daily. Patient not taking: Reported on 06/10/2017 03/21/17   Dessa Phi Chahn-Yang, DO    Family History Family History  Problem Relation Age of Onset  . Aneurysm Mother   . Heart attack Father   . Stroke Father   . Breast cancer Paternal Grandmother     Social History Social History  Substance Use Topics  . Smoking status: Current Every Day Smoker    Packs/day: 1.00    Years: 24.00    Types: Cigarettes  . Smokeless tobacco: Never Used  . Alcohol use Yes     Comment: seldom     Allergies   Codeine and Sulfa antibiotics   Review of Systems Review of Systems  Constitutional: Positive for activity change, appetite change, chills, fatigue and fever.  Genitourinary: Negative for dysuria, flank pain, frequency and urgency.  Musculoskeletal: Positive for myalgias.  Negative for gait problem and joint swelling.  All other systems reviewed and are negative.    Physical Exam Updated Vital Signs BP 117/75   Pulse 92   Temp 98.8 F (37.1 C) (Oral)   Resp (!) 21   Ht 5' 9"  (1.753 m)   Wt 95.3 kg (210 lb)   SpO2 96%   BMI 31.01 kg/m   Physical Exam  Constitutional: She appears well-developed and well-nourished. No distress.  Cardiovascular: Normal rate, regular rhythm and normal heart sounds.  Exam reveals no gallop and no friction rub.   No murmur heard. Pulmonary/Chest: Effort normal and breath sounds normal. No respiratory distress. She has no wheezes. She has no rales.  Musculoskeletal: Normal range of motion. She exhibits tenderness. She exhibits no edema or deformity.  There is a small, mobile mass behind R knee that is tender to the touch. No fluctuance noted. No erythema or warmth noted.   Skin:  R medial thigh incision well-healed, without erythema or warmth associated to it. No tenderness to palpation.      ED Treatments / Results  Labs (all labs ordered are listed, but only abnormal results are displayed) Labs Reviewed  COMPREHENSIVE  METABOLIC PANEL - Abnormal; Notable for the following:       Result Value   Sodium 131 (*)    Chloride 94 (*)    Glucose, Bld 361 (*)    BUN 25 (*)    Creatinine, Ser 1.25 (*)    GFR calc non Af Amer 50 (*)    GFR calc Af Amer 58 (*)    All other components within normal limits  CBC WITH DIFFERENTIAL/PLATELET - Abnormal; Notable for the following:    WBC 10.8 (*)    Neutro Abs 8.4 (*)    All other components within normal limits  URINALYSIS, ROUTINE W REFLEX MICROSCOPIC - Abnormal; Notable for the following:    APPearance HAZY (*)    Glucose, UA >=500 (*)    Nitrite POSITIVE (*)    Leukocytes, UA LARGE (*)    Bacteria, UA RARE (*)    Squamous Epithelial / LPF 0-5 (*)    All other components within normal limits  C-REACTIVE PROTEIN - Abnormal; Notable for the following:    CRP 14.5 (*)    All other components within normal limits  CBG MONITORING, ED - Abnormal; Notable for the following:    Glucose-Capillary 356 (*)    All other components within normal limits  URINE CULTURE  LACTIC ACID, PLASMA    EKG  EKG Interpretation None       Radiology Ct Femur Right W Contrast  Result Date: 06/10/2017 CLINICAL DATA:  Concern for necrotizing fasciitis. EXAM: CT OF THE LOWER RIGHT EXTREMITY WITH CONTRAST TECHNIQUE: Multidetector CT imaging of the lower right extremity was performed according to the standard protocol following intravenous contrast administration. COMPARISON:  Right femur x-rays dated June 07, 2017. CONTRAST:  114m ISOVUE-300 IOPAMIDOL (ISOVUE-300) INJECTION 61% FINDINGS: Bones/Joint/Cartilage No acute fracture or malalignment. Minimal degenerative changes of the right hip joint. The knee joint spaces are relatively preserved. No knee joint effusion. Bone mineralization is normal. Ligaments Suboptimally assessed by CT. Muscles and Tendons No focal abnormality. Soft tissues Scarring in the medial upper right thigh at site of prior surgery. No significant soft tissue  edema. No drainable fluid collection or subcutaneous emphysema. The visualized intrapelvic contents are unremarkable. Atherosclerotic vascular calcifications. IMPRESSION: 1. No evidence of subcutaneous emphysema to suggest necrotizing fasciitis. No drainable fluid  collection. 2. No significant skin thickening or soft tissue swelling to suggest cellulitis. Scarring in the upper right medial thigh at site of prior surgery. 3.  No acute osseous abnormality. Electronically Signed   By: Titus Dubin M.D.   On: 06/10/2017 12:13   Dg Femur Min 2 Views Right  Result Date: 06/10/2017 CLINICAL DATA:  Chills and fever with painful knots over the back of the right leg. Recent surgery for necrotizing fasciitis over the medial aspect of the proximal thigh. EXAM: RIGHT FEMUR 2 VIEWS COMPARISON:  CT scan of the right lower extremity of today's date and right femur radiograph of June 07, 2017 FINDINGS: The femur is subjectively adequately mineralized. There is no lytic or blastic lesion nor other acute abnormality. The overlying soft tissues exhibit no foreign bodies. No abnormal gas collections are observed. IMPRESSION: No acute bony abnormality of the right femur is observed. The soft tissues of the thigh exhibit no definite acute abnormalities. Electronically Signed   By: David  Martinique M.D.   On: 06/10/2017 12:11    Procedures Procedures (including critical care time)  Medications Ordered in ED Medications  iopamidol (ISOVUE-300) 61 % injection (not administered)  sodium chloride 0.9 % bolus 1,000 mL (1,000 mLs Intravenous New Bag/Given 06/10/17 1252)  cefTRIAXone (ROCEPHIN) 1 g in dextrose 5 % 50 mL IVPB (1 g Intravenous New Bag/Given 06/10/17 1252)  iopamidol (ISOVUE-300) 61 % injection 100 mL (100 mLs Intravenous Contrast Given 06/10/17 1125)  insulin aspart (novoLOG) injection 5 Units (5 Units Subcutaneous Given 06/10/17 1251)     Initial Impression / Assessment and Plan / ED Course  I have reviewed  the triage vital signs and the nursing notes.  Pertinent labs & imaging results that were available during my care of the patient were reviewed by me and considered in my medical decision making (see chart for details).    ZELPHIA GLOVER is a 46 y.o. female with history of insulin-dependent T2DM, HTN, OSA, and recent R thigh necrotizing fasciitis s/p I&D 03/17/2017 who presents to the ED from wound center with worsening RLE, low grade fever x4 days, generalized weakness, and poor PO intake. Concern for necrotizing fasciitis given patient's history. Will order CBC, CMP, R femur XR, CT RLE w/ contrast, and Doppler U/s of RLE.   1145 R femur XR, CT RLE, and and Doppler U/S negative.   1225 WBC 10.8. Cr 1.25. UA dirty with TMTC WBC, leukocytes and nitrites. Remains afebrile and hemodynamically stable. No complaints when reassessed. Explained to patient that her symptoms are likely related to a UTI. Ucx ordered. Will give 1 dose of IV Rocephin and 1L NS bolus. Will also give Novolog 5U for BG >350. Plan to d/c home with macrobid 100 mg BID x4 days.   Case discussed with Dr. Jeneen Rinks.    Final Clinical Impressions(s) / ED Diagnoses   Final diagnoses:  Acute cystitis without hematuria    New Prescriptions New Prescriptions   NITROFURANTOIN, MACROCRYSTAL-MONOHYDRATE, (MACROBID) 100 MG CAPSULE    Take 1 capsule (100 mg total) by mouth 2 (two) times daily. Starting 10/12     Welford Roche, MD 06/10/17 1306    Tanna Furry, MD 06/20/17 1630

## 2017-06-12 LAB — URINE CULTURE: Culture: >=100000 — AB

## 2017-06-13 ENCOUNTER — Telehealth: Payer: Self-pay

## 2017-06-13 NOTE — Telephone Encounter (Signed)
Post ED Visit - Positive Culture Follow-up: Successful Patient Follow-Up  Culture assessed and recommendations reviewed by: []  , Pharm.D. []  Enzo Bi, Pharm.D., BCPS AQ-ID []  , Pharm.D., BCPS []  Celedonio Miyamoto, Pharm.D., BCPS []  Pella, Garvin Fila.D., BCPS, AAHIVP []  , Pharm.D., BCPS, AAHIVP [x]  Georgina Pillion, PharmD, BCPS []  , PharmD, BCPS []  [place], PharmD, BCPS  Positive urine culture  []  Patient discharged without antimicrobial prescription and treatment is now indicated [x]  Organism is resistant to prescribed ED discharge antimicrobial []  Patient with positive blood cultures  Changes discussed with ED provider: [address] [place] New antibiotic prescription Keflex 500 mg po BID x 7 days Called to [facility] [place]/ [address] [phone]  Contacted patient, date 06/13/17, time 1009   Karima Carrell 06/13/2017, 10:07 AM

## 2017-06-13 NOTE — Progress Notes (Signed)
ED Antimicrobial Stewardship Positive Culture Follow Up   [name] is an 46 y.o. female who presented to [facility] on 06/10/2017 with a chief complaint of  Chief Complaint  Patient presents with  . Fever  . Leg Pain    Recent Results (from the past 720 hour(s))  Urine culture     Status: Abnormal   Collection Time: 06/10/17  1:14 PM  Result Value Ref Range Status   Specimen Description URINE, CLEAN CATCH  Final   Special Requests NONE  Final   Culture >=100,000 COLONIES/mL KLEBSIELLA PNEUMONIAE (A)  Final   Report Status 06/12/2017 FINAL  Final   Organism ID, Bacteria KLEBSIELLA PNEUMONIAE (A)  Final      Susceptibility   Klebsiella pneumoniae - MIC*    AMPICILLIN >=32 RESISTANT Resistant     CEFAZOLIN <=4 SENSITIVE Sensitive     CEFTRIAXONE <=1 SENSITIVE Sensitive     CIPROFLOXACIN <=0.25 SENSITIVE Sensitive     GENTAMICIN <=1 SENSITIVE Sensitive     IMIPENEM 0.5 SENSITIVE Sensitive     NITROFURANTOIN 64 INTERMEDIATE Intermediate     TRIMETH/SULFA <=20 SENSITIVE Sensitive     AMPICILLIN/SULBACTAM 4 SENSITIVE Sensitive     PIP/TAZO <=4 SENSITIVE Sensitive     Extended ESBL NEGATIVE Sensitive     * >=100,000 COLONIES/mL KLEBSIELLA PNEUMONIAE    [x]  Treated with macrobid, organism resistant to prescribed antimicrobial []  Patient discharged originally without antimicrobial agent and treatment is now indicated  New antibiotic prescription: DC macrobid, start cephalexin 500mg  PO BID x 7 days  ED Provider: , PA  Natayah Warmack, 06/13/2017, 9:50 AM Clinical Pharmacist Phone# [phone]

## 2017-07-07 ENCOUNTER — Encounter (HOSPITAL_COMMUNITY): Payer: Self-pay | Admitting: Emergency Medicine

## 2017-07-07 ENCOUNTER — Emergency Department (HOSPITAL_COMMUNITY): Payer: Medicaid Other

## 2017-07-07 ENCOUNTER — Emergency Department (HOSPITAL_COMMUNITY)
Admission: EM | Admit: 2017-07-07 | Discharge: 2017-07-07 | Disposition: A | Discharge status: Home / Self Care | Payer: Medicaid Other | Attending: Emergency Medicine | Admitting: Emergency Medicine

## 2017-07-07 DIAGNOSIS — N390 Urinary tract infection, site not specified: Secondary | ICD-10-CM | POA: Insufficient documentation

## 2017-07-07 DIAGNOSIS — Z794 Long term (current) use of insulin: Secondary | ICD-10-CM | POA: Insufficient documentation

## 2017-07-07 DIAGNOSIS — I10 Essential (primary) hypertension: Secondary | ICD-10-CM | POA: Insufficient documentation

## 2017-07-07 DIAGNOSIS — M47816 Spondylosis without myelopathy or radiculopathy, lumbar region: Secondary | ICD-10-CM

## 2017-07-07 DIAGNOSIS — R1012 Left upper quadrant pain: Secondary | ICD-10-CM | POA: Insufficient documentation

## 2017-07-07 DIAGNOSIS — M4696 Unspecified inflammatory spondylopathy, lumbar region: Secondary | ICD-10-CM | POA: Insufficient documentation

## 2017-07-07 DIAGNOSIS — R110 Nausea: Secondary | ICD-10-CM | POA: Insufficient documentation

## 2017-07-07 DIAGNOSIS — F17210 Nicotine dependence, cigarettes, uncomplicated: Secondary | ICD-10-CM | POA: Insufficient documentation

## 2017-07-07 DIAGNOSIS — E119 Type 2 diabetes mellitus without complications: Secondary | ICD-10-CM | POA: Insufficient documentation

## 2017-07-07 DIAGNOSIS — Z7982 Long term (current) use of aspirin: Secondary | ICD-10-CM | POA: Insufficient documentation

## 2017-07-07 LAB — COMPREHENSIVE METABOLIC PANEL
ALT: 18 U/L (ref 14–54)
AST: 19 U/L (ref 15–41)
Albumin: 3.7 g/dL (ref 3.5–5.0)
Alkaline Phosphatase: 132 U/L — ABNORMAL HIGH (ref 38–126)
Anion gap: 11 (ref 5–15)
BUN: 19 mg/dL (ref 6–20)
CO2: 24 mmol/L (ref 22–32)
Calcium: 9.4 mg/dL (ref 8.9–10.3)
Chloride: 97 mmol/L — ABNORMAL LOW (ref 101–111)
Creatinine, Ser: 1.08 mg/dL — ABNORMAL HIGH (ref 0.44–1.00)
GFR calc Af Amer: >60 mL/min (ref >60)
GFR calc non Af Amer: 60 mL/min — ABNORMAL LOW (ref >60)
Glucose, Bld: 379 mg/dL — ABNORMAL HIGH (ref 65–99)
Potassium: 4.2 mmol/L (ref 3.5–5.1)
Sodium: 132 mmol/L — ABNORMAL LOW (ref 135–145)
Total Bilirubin: 0.4 mg/dL (ref 0.3–1.2)
Total Protein: 7.4 g/dL (ref 6.5–8.1)

## 2017-07-07 LAB — CBC WITH DIFFERENTIAL/PLATELET
Basophils Absolute: 0 10*3/uL (ref 0.0–0.1)
Basophils Relative: 0 %
Eosinophils Absolute: 0.1 10*3/uL (ref 0.0–0.7)
Eosinophils Relative: 1 %
HCT: 43 % (ref 36.0–46.0)
Hemoglobin: 14.9 g/dL (ref 12.0–15.0)
Lymphocytes Relative: 36 %
Lymphs Abs: 3.5 10*3/uL (ref 0.7–4.0)
MCH: 29 pg (ref 26.0–34.0)
MCHC: 34.7 g/dL (ref 30.0–36.0)
MCV: 83.7 fL (ref 78.0–100.0)
Monocytes Absolute: 0.4 10*3/uL (ref 0.1–1.0)
Monocytes Relative: 4 %
Neutro Abs: 5.6 10*3/uL (ref 1.7–7.7)
Neutrophils Relative %: 59 %
Platelets: 198 10*3/uL (ref 150–400)
RBC: 5.14 MIL/uL — ABNORMAL HIGH (ref 3.87–5.11)
RDW: 13.3 % (ref 11.5–15.5)
WBC: 9.5 10*3/uL (ref 4.0–10.5)

## 2017-07-07 LAB — URINALYSIS, ROUTINE W REFLEX MICROSCOPIC
Bilirubin Urine: NEGATIVE
Glucose, UA: >=500 mg/dL — AB
Hgb urine dipstick: NEGATIVE
Ketones, ur: NEGATIVE mg/dL
Nitrite: NEGATIVE
Protein, ur: NEGATIVE mg/dL
Specific Gravity, Urine: 1.02 (ref 1.005–1.030)
pH: 7 (ref 5.0–8.0)

## 2017-07-07 LAB — PREGNANCY, URINE: Preg Test, Ur: NEGATIVE

## 2017-07-07 LAB — LIPASE, BLOOD: Lipase: 28 U/L (ref 11–51)

## 2017-07-07 LAB — CBG MONITORING, ED: Glucose-Capillary: 393 mg/dL — ABNORMAL HIGH (ref 65–99)

## 2017-07-07 MED ORDER — MORPHINE SULFATE (PF) 4 MG/ML IV SOLN
4.0000 mg | Freq: Once | INTRAVENOUS | Status: AC
  Administered 2017-07-07: 4 mg via INTRAVENOUS
  Filled 2017-07-07: qty 1

## 2017-07-07 MED ORDER — PANTOPRAZOLE SODIUM 40 MG IV SOLR
40.0000 mg | Freq: Once | INTRAVENOUS | Status: AC
  Administered 2017-07-07: 40 mg via INTRAVENOUS
  Filled 2017-07-07: qty 40

## 2017-07-07 MED ORDER — SODIUM CHLORIDE 0.9 % IV BOLUS (SEPSIS)
1000.0000 mL | Freq: Once | INTRAVENOUS | Status: AC
  Administered 2017-07-07: 1000 mL via INTRAVENOUS

## 2017-07-07 MED ORDER — IOPAMIDOL (ISOVUE-300) INJECTION 61%
100.0000 mL | Freq: Once | INTRAVENOUS | Status: AC | PRN
  Administered 2017-07-07: 100 mL via INTRAVENOUS

## 2017-07-07 MED ORDER — DIPHENHYDRAMINE HCL 50 MG/ML IJ SOLN
12.5000 mg | Freq: Once | INTRAMUSCULAR | Status: AC
  Administered 2017-07-07: 12.5 mg via INTRAVENOUS
  Filled 2017-07-07: qty 1

## 2017-07-07 MED ORDER — CIPROFLOXACIN HCL 500 MG PO TABS: 500.0000 mg | ORAL_TABLET | Freq: Two times a day (BID) | ORAL | 0 refills | Status: DC

## 2017-07-07 MED ORDER — IOPAMIDOL (ISOVUE-300) INJECTION 61%
INTRAVENOUS | Status: AC
  Filled 2017-07-07: qty 100

## 2017-07-07 MED ORDER — FAMOTIDINE 20 MG PO TABS: 20.0000 mg | ORAL_TABLET | Freq: Two times a day (BID) | ORAL | 0 refills | Status: DC

## 2017-07-07 NOTE — ED Provider Notes (Signed)
[facility] DEPT Provider Note   CSN: [id number] Arrival date & time: 07/07/17  8022     History   Chief Complaint Chief Complaint  Patient presents with  . Abdominal Pain  . Nausea    HPI [name] is a 46 y.o. female w PMHx insulin-dependent T2DM, HTN, depression, neprholithiasis, presenting to the ED with gradual onset of LUQ abdominal pain that is constant and aching. She states the pain worsens after a meal and radiates around to her back. She reports assoc intermittent nausea. She has been treating pain with leftover Oxycodone from a surgery in July, with relief of symptoms. Reports intermittent dysuria. Denies F/C, D/C, vomiting, chest pain, SOB, rash, or other complaints. She states she has had pancreatitis in the past and it doesn't feel similar. No known hx PUD. Last BM was today and normal. Abdominal surgeries include cholecystectomy and appendectomy.  The history is provided by the patient.    Past Medical History:  Diagnosis Date  . Arthritis    fingers  . Depression   . Diabetes mellitus   . Dyspnea 11/22/2013  . Hypertension   . Kidney stones   . PONV (postoperative nausea and vomiting)   . Sleep apnea    lost 70lbs no cpap x88yr now    Patient Active Problem List   Diagnosis Date Noted  . Wound infection 03/28/2017  . Necrotizing fasciitis right thigh s/p I&D 03/17/2017 03/18/2017  . Cellulitis of leg, right   . Hyperglycemia   . Cellulitis 03/14/2017  . Hyponatremia 03/14/2017  . OSA (obstructive sleep apnea) 11/22/2013  . Dyspnea 11/22/2013  . Uncontrolled type 2 diabetes mellitus with hyperosmolar nonketotic hyperglycemia ([place] 07/21/2012  . HTN (hypertension) 07/21/2012  . Smoker 07/21/2012    Past Surgical History:  Procedure Laterality Date  . CARDIAC CATHETERIZATION     4-576yrago  . CHOLECYSTECTOMY    . ENDOMETRIAL ABLATION     6 years ago    OB History    No data available       Home  Medications    Prior to Admission medications   Medication Sig Start Date End Date Taking? Authorizing Provider  aspirin EC 81 MG tablet Take 81 mg by mouth daily.    Yes [provider]  esomeprazole (NEXIUM) 40 MG capsule Take 40 mg by mouth daily.    Yes [provider]  FLUoxetine (PROZAC) 40 MG capsule Take 40 mg by mouth daily.   Yes [provider]  gabapentin (NEURONTIN) 300 MG capsule Take 300 mg by mouth daily as needed (pain).    Yes [provider]  hydrOXYzine (ATARAX/VISTARIL) 10 MG tablet Take 10 mg by mouth at bedtime as needed for sleep. 06/02/17  Yes [provider]  insulin aspart (NOVOLOG) 100 UNIT/ML injection Novolog is your short-acting correction insulin. Check your blood sugar prior to your meal. Then depending on your blood sugar, inject insulin as follows: CBG 121 - 150: 3 units CBG 151 - 200: 4 units CBG 201 - 250: 7 units CBG 251 - 300: 11 units CBG 301 - 350: 15 units CBG 351 - 400: 20 units Patient taking differently: Inject 0-5 Units into the skin 3 (three) times daily with meals. Novolog is your short-acting correction insulin. Check your blood sugar prior to your meal. Then depending on your blood sugar, inject insulin as follows: CBG 121 - 150: 3 units CBG 151 - 200: 4 units CBG 201 - 250: 7 units  CBG 251 - 300: 11 units CBG 301 - 350: 15 units CBG 351 - 400: 20 units 03/21/17 03/21/18 Yes Choi, Jennifer Chahn-Yang, DO  insulin aspart protamine- aspart (NOVOLOG MIX 70/30) (70-30) 100 UNIT/ML injection Inject 0.25 mLs (25 Units total) into the skin 2 (two) times daily with a meal. Patient taking differently: Inject 17 Units into the skin 2 (two) times daily with a meal.  03/21/17  Yes Dessa Phi Chahn-Yang, DO  lisinopril (PRINIVIL,ZESTRIL) 20 MG tablet Take 20 mg by mouth daily. 01/04/17  Yes [provider]  LORazepam (ATIVAN) 1 MG tablet Take 1 mg by mouth 2 (two) times daily as needed for anxiety.  06/03/17  Yes [provider]  meloxicam (MOBIC) 7.5 MG tablet Take 7.5 mg by mouth daily. 03/17/17  Yes [provider]  metFORMIN (GLUCOPHAGE) 1000 MG tablet Take 1,000 mg by mouth 2 (two) times daily. 06/02/17  Yes [provider]  Multiple Vitamin (MULTIVITAMIN WITH MINERALS) TABS tablet Take 1 tablet by mouth daily.   Yes [provider]  acetaminophen (TYLENOL) 500 MG tablet Take 1-2 tablets (500-1,000 mg total) by mouth every 8 (eight) hours as needed for mild pain or moderate pain. Not to exceed 4,050m in 24 hour period Patient not taking: Reported on 07/07/2017 03/21/17   CDessa PhiChahn-Yang, DO  blood glucose meter kit and supplies Dispense based on patient and insurance preference. Use up to four times daily as directed. (FOR ICD-9 250.00, 250.01). 03/21/17   CDessa PhiChahn-Yang, DO  ciprofloxacin (CIPRO) 500 MG tablet Take 1 tablet (500 mg total) 2 (two) times daily by mouth. 07/07/17   Robinson, JMartiniqueN, PA-C  famotidine (PEPCID) 20 MG tablet Take 1 tablet (20 mg total) 2 (two) times daily by mouth. 07/07/17   Robinson, JMartiniqueN, PA-C  Insulin Syringe-Needle U-100 (INSULIN SYRINGE .3CC/31GX5/16") 31G X 5/16" 0.3 ML MISC Use with insulin, up to 5 times daily 03/21/17   CDessa PhiChahn-Yang, DO  oxyCODONE (OXY IR/ROXICODONE) 5 MG immediate release tablet Take 2-3 tablets (10-15 mg total) by mouth daily as needed (with dressing changes). Patient not taking: Reported on 06/10/2017 03/30/17   GPatrecia Pour MD  psyllium (HYDROCIL/METAMUCIL) 95 % PACK Take 1 packet by mouth 2 (two) times daily. Patient not taking: Reported on 06/10/2017 03/21/17   CDessa PhiChahn-Yang, DO    Family History Family History  Problem Relation Age of Onset  . Aneurysm Mother   . Heart attack Father   . Stroke Father   . Breast cancer Paternal Grandmother     Social History Social History   Tobacco Use  . Smoking status: Current Every Day Smoker     Packs/day: 1.00    Years: 24.00    Pack years: 24.00    Types: Cigarettes  . Smokeless tobacco: Never Used  Substance Use Topics  . Alcohol use: Yes    Comment: seldom  . Drug use: No    Comment: 25 years ago 11/22/13     Allergies   Codeine and Sulfa antibiotics   Review of Systems Review of Systems  Constitutional: Negative for chills and fever.  Respiratory: Negative for cough and shortness of breath.   Cardiovascular: Negative for chest pain.  Gastrointestinal: Positive for abdominal pain and nausea. Negative for constipation, diarrhea and vomiting.  Genitourinary: Positive for dysuria. Negative for frequency, vaginal bleeding and vaginal discharge.  Skin: Negative for rash.  Allergic/Immunologic: Positive for immunocompromised state (T2DM).  All other systems reviewed and are  negative.    Physical Exam Updated Vital Signs BP 117/87 (BP Location: Right Arm)   Pulse 69   Temp (!) 97.5 F (36.4 C) (Oral)   Resp 16   SpO2 98%   Physical Exam  Constitutional: She appears well-developed and well-nourished.  Non-toxic appearance. She does not appear ill. No distress.  HENT:  Head: Normocephalic and atraumatic.  Mouth/Throat: Oropharynx is clear and moist.  Eyes: Conjunctivae are normal.  Cardiovascular: Normal rate, regular rhythm and normal heart sounds.  Pulmonary/Chest: Effort normal and breath sounds normal. No respiratory distress. She has no wheezes. She has no rales.  Abdominal: Soft. Bowel sounds are normal. She exhibits no distension and no mass. There is tenderness in the epigastric area and left upper quadrant. There is no rigidity, no rebound and no guarding.  Neurological: She is alert.  Skin: Skin is warm. No rash noted.  Psychiatric: She has a normal mood and affect. Her behavior is normal.  Nursing note and vitals reviewed.    ED Treatments / Results  Labs (all labs ordered are listed, but only abnormal results are displayed) Labs Reviewed    COMPREHENSIVE METABOLIC PANEL - Abnormal; Notable for the following components:      Result Value   Sodium 132 (*)    Chloride 97 (*)    Glucose, Bld 379 (*)    Creatinine, Ser 1.08 (*)    Alkaline Phosphatase 132 (*)    GFR calc non Af Amer 60 (*)    All other components within normal limits  URINALYSIS, ROUTINE W REFLEX MICROSCOPIC - Abnormal; Notable for the following components:   Glucose, UA >=500 (*)    Leukocytes, UA MODERATE (*)    Bacteria, UA RARE (*)    Squamous Epithelial / LPF 0-5 (*)    All other components within normal limits  CBC WITH DIFFERENTIAL/PLATELET - Abnormal; Notable for the following components:   RBC 5.14 (*)    All other components within normal limits  CBG MONITORING, ED - Abnormal; Notable for the following components:   Glucose-Capillary 393 (*)    All other components within normal limits  URINE CULTURE  LIPASE, BLOOD  PREGNANCY, URINE    EKG  EKG Interpretation None       Radiology Ct Abdomen Pelvis W Contrast  Result Date: 07/07/2017 CLINICAL DATA:  Upper abdominal pain and nausea for 6 weeks. EXAM: CT ABDOMEN AND PELVIS WITH CONTRAST TECHNIQUE: Multidetector CT imaging of the abdomen and pelvis was performed using the standard protocol following bolus administration of intravenous contrast. CONTRAST:  18m ISOVUE-300 IOPAMIDOL (ISOVUE-300) INJECTION 61% COMPARISON:  CT scan dated 10/04/2011 and radiographs dated 07/07/2017 FINDINGS: Lower chest: Normal. Hepatobiliary: No focal liver abnormality is seen. Status post cholecystectomy. No biliary dilatation. Pancreas: Unremarkable. No pancreatic ductal dilatation or surrounding inflammatory changes. Spleen: Normal in size without focal abnormality. Adrenals/Urinary Tract: Adrenal glands are unremarkable. Kidneys are normal, without renal calculi, focal lesion, or hydronephrosis. Bladder is unremarkable. Stomach/Bowel: Stomach is within normal limits. Appendix has been removed. No evidence of  bowel wall thickening, distention, or inflammatory changes. Specifically, no dilated loops of small bowel. Vascular/Lymphatic: Aortic atherosclerosis. No enlarged abdominal or pelvic lymph nodes. Reproductive: Uterus and bilateral adnexa are unremarkable. Other: No abdominal wall hernia or abnormality. No abdominopelvic ascites. Musculoskeletal: There is severe bilateral facet arthritis at L4-5 with prominent complex synovial cysts containing gas arising from the anterior aspect of each facet joint extending into the spinal canal and compressing the right and left sides  of the thecal sac. This could affect both L5 nerves. There is also severe right facet arthritis at L5-S1. IMPRESSION: 1. No acute abnormality of the abdomen or pelvis. Specifically, resolution of the dilated small bowel loop seen on the prior radiographs of 07/07/2017. 2. Impingement upon the thecal sac at L4-5 by prominent bilateral facet joint synovial cysts as described above. 3. Aortic atherosclerosis. Electronically Signed   By: Lorriane Shire M.D.   On: 07/07/2017 12:21   Dg Abdomen Acute W/chest  Result Date: 07/07/2017 CLINICAL DATA:  LUQ abd pain and nausea x 6 days; no constipation; no diarrhea; hx cholecystectomy and appendectomy EXAM: DG ABDOMEN ACUTE W/ 1V CHEST COMPARISON:  Chest x-ray dated 12/08/2016. Abdominal x-ray dated 03/20/2017. FINDINGS: Single-view of the chest: Heart size and mediastinal contours are normal. Lungs are clear. No pleural effusion or pneumothorax seen. Osseous structures about the chest are unremarkable. Upright and supine views of the abdomen: No dilated large or small bowel loops identified. Single nonspecific air-fluid level within the central abdomen, slightly left of midline. No evidence of soft tissue mass or free intraperitoneal air. No acute or suspicious osseous finding. IMPRESSION: 1. Lungs are clear. No evidence of acute cardiopulmonary abnormality. No evidence of pneumonia or pulmonary edema. 2.  Nonobstructive bowel gas pattern. Single nonspecific air-fluid level within the central abdomen, slightly left of midline, which can be seen with ileus related to gastroenteritis or early/partial obstruction. Electronically Signed   By: Franki Cabot M.D.   On: 07/07/2017 10:10    Procedures Procedures (including critical care time)  Medications Ordered in ED Medications  iopamidol (ISOVUE-300) 61 % injection (not administered)  sodium chloride 0.9 % bolus 1,000 mL (0 mLs Intravenous Stopped 07/07/17 1058)  morphine 4 MG/ML injection 4 mg (4 mg Intravenous Given 07/07/17 0846)  pantoprazole (PROTONIX) injection 40 mg (40 mg Intravenous Given 07/07/17 0846)  morphine 4 MG/ML injection 4 mg (4 mg Intravenous Given 07/07/17 1100)  diphenhydrAMINE (BENADRYL) injection 12.5 mg (12.5 mg Intravenous Given 07/07/17 1100)  iopamidol (ISOVUE-300) 61 % injection 100 mL (100 mLs Intravenous Contrast Given 07/07/17 1146)     Initial Impression / Assessment and Plan / ED Course  I have reviewed the triage vital signs and the nursing notes.  Pertinent labs & imaging results that were available during my care of the patient were reviewed by me and considered in my medical decision making (see chart for details).     Pt w left upper quadrant abdominal pain and intermittent nausea. Patient is nontoxic, nonseptic appearing, in no apparent distress.  Patient's pain and other symptoms adequately managed in emergency department.  Fluid bolus given.  Labs, imaging and vitals reviewed.  Patient does not meet the SIRS or Sepsis criteria.  On repeat exam patient does not have a surgical abdomen and there are no peritoneal signs.  Per CT, no indication of bowel obstruction, bowel perforation, diverticulitis. Suspect gastritis vs PUD vs possible pyelonephritis. Pt's pain radiates to her back. Urine with evidence of infection. Will treat with Cipro to cover pyelo, and treat symptomatically for possible gastritis. CT with  incidental findings of severe lumbar facet joint arthritis with management upon the thecal sac at L4-L5.  Patient denying any lower back pain, however reporting bilateral chronic feet numbness, stress related to diabetic neuropathy.  Given these new findings, neurosurgery was consulted, spoke w Dr. Christella Noa, who states patient is safe for discharge with outpatient follow-up as needed. Patient discharged home with symptomatic treatment and given strict instructions for  follow-up with their primary care physician.  Pt safe for discharge.  Patient discussed with Dr. Dorie Rank, who agrees with care plan.  Discussed results, findings, treatment and follow up. Patient advised of return precautions. Patient verbalized understanding and agreed with plan.   Final Clinical Impressions(s) / ED Diagnoses   Final diagnoses:  LUQ abdominal pain  Urinary tract infection without hematuria, site unspecified  Arthritis of facet joint of lumbar spine [facility])    ED Discharge Orders        Ordered    ciprofloxacin (CIPRO) 500 MG tablet  2 times daily     07/07/17 1430    famotidine (PEPCID) 20 MG tablet  2 times daily     07/07/17 1430       Robinson, [place] N, [place] 07/07/17 1506    Dorie Rank, MD 07/08/17 [phone]

## 2017-07-07 NOTE — ED Triage Notes (Signed)
Patient c/o mid upper abd pain that radiates to LUQ since Friday. Patient reports nausea without vomiting or diarrhea. Reports pain will get worse after eating.

## 2017-07-07 NOTE — Discharge Instructions (Signed)
Please read instructions below. Avoid foods high in acidity, or fatty/greasy foods. Caffeine can also irritate the stomach.  You can take tylenol as needed for pain.  Take pepcid twice daily for stomach upset. Begin taking the antibiotic, ciprofloxacin, twice daily until it is gone. Follow up with your primary care provider about your visit today. You can discuss the  CT findings in your spine with your primary care, or you can use the neurosurgery referral. Return to the ER for new or concerning symptoms.

## 2017-07-09 LAB — URINE CULTURE: Culture: 30000 — AB

## 2017-07-10 ENCOUNTER — Telehealth: Payer: Self-pay

## 2017-07-10 NOTE — Telephone Encounter (Signed)
Post ED Visit - Positive Culture Follow-up  Culture report reviewed by antimicrobial stewardship pharmacist:  []  , Pharm.D. []  Enzo Bi, .D., BCPS AQ-ID [x]  Celedonio Miyamoto, Pharm.D., BCPS []  [address], Pharm.D., BCPS []  [place], Garvin Fila.D., BCPS, AAHIVP []  , Pharm.D., BCPS, AAHIVP []  Georgina Pillion, PharmD, BCPS []  , PharmD, BCPS []  [place], PharmD, BCPS  Positive urine culture Treated with Ciprofloxacin, organism sensitive to the same and no further patient follow-up is required at this time.  [address] 07/10/2017, 9:56 AM

## 2017-11-23 ENCOUNTER — Encounter (HOSPITAL_COMMUNITY): Payer: Self-pay | Admitting: Nurse Practitioner

## 2017-11-23 ENCOUNTER — Emergency Department (HOSPITAL_COMMUNITY): Payer: Self-pay

## 2017-11-23 ENCOUNTER — Other Ambulatory Visit: Payer: Self-pay

## 2017-11-23 ENCOUNTER — Emergency Department (HOSPITAL_COMMUNITY)
Admission: EM | Admit: 2017-11-23 | Discharge: 2017-11-23 | Disposition: A | Discharge status: Home / Self Care | Payer: Self-pay | Attending: Emergency Medicine | Admitting: Emergency Medicine

## 2017-11-23 DIAGNOSIS — M79662 Pain in left lower leg: Secondary | ICD-10-CM | POA: Insufficient documentation

## 2017-11-23 DIAGNOSIS — Z79899 Other long term (current) drug therapy: Secondary | ICD-10-CM | POA: Insufficient documentation

## 2017-11-23 DIAGNOSIS — I10 Essential (primary) hypertension: Secondary | ICD-10-CM | POA: Insufficient documentation

## 2017-11-23 DIAGNOSIS — E1165 Type 2 diabetes mellitus with hyperglycemia: Secondary | ICD-10-CM | POA: Insufficient documentation

## 2017-11-23 DIAGNOSIS — F17210 Nicotine dependence, cigarettes, uncomplicated: Secondary | ICD-10-CM | POA: Insufficient documentation

## 2017-11-23 DIAGNOSIS — R519 Headache, unspecified: Secondary | ICD-10-CM

## 2017-11-23 DIAGNOSIS — Z794 Long term (current) use of insulin: Secondary | ICD-10-CM | POA: Insufficient documentation

## 2017-11-23 DIAGNOSIS — R51 Headache: Secondary | ICD-10-CM | POA: Insufficient documentation

## 2017-11-23 DIAGNOSIS — M79661 Pain in right lower leg: Secondary | ICD-10-CM | POA: Insufficient documentation

## 2017-11-23 LAB — BLOOD GAS, VENOUS
Acid-Base Excess: 1 mmol/L (ref 0.0–2.0)
Bicarbonate: 24.3 mmol/L (ref 20.0–28.0)
Drawn by: 257881
O2 Saturation: 90.7 %
Patient temperature: 98.6
pCO2, Ven: 36.5 mmHg — ABNORMAL LOW (ref 44.0–60.0)
pH, Ven: 7.439 — ABNORMAL HIGH (ref 7.250–7.430)
pO2, Ven: 59.2 mmHg — ABNORMAL HIGH (ref 32.0–45.0)

## 2017-11-23 LAB — URINALYSIS, ROUTINE W REFLEX MICROSCOPIC
Bilirubin Urine: NEGATIVE
Glucose, UA: >=500 mg/dL — AB
Ketones, ur: 20 mg/dL — AB
Nitrite: NEGATIVE
Protein, ur: NEGATIVE mg/dL
Specific Gravity, Urine: 1.022 (ref 1.005–1.030)
pH: 6 (ref 5.0–8.0)

## 2017-11-23 LAB — COMPREHENSIVE METABOLIC PANEL
ALT: 26 U/L (ref 14–54)
AST: 17 U/L (ref 15–41)
Albumin: 3.5 g/dL (ref 3.5–5.0)
Alkaline Phosphatase: 202 U/L — ABNORMAL HIGH (ref 38–126)
Anion gap: 14 (ref 5–15)
BUN: 20 mg/dL (ref 6–20)
CO2: 23 mmol/L (ref 22–32)
Calcium: 9.3 mg/dL (ref 8.9–10.3)
Chloride: 93 mmol/L — ABNORMAL LOW (ref 101–111)
Creatinine, Ser: 1.06 mg/dL — ABNORMAL HIGH (ref 0.44–1.00)
GFR calc Af Amer: >60 mL/min (ref >60)
GFR calc non Af Amer: >60 mL/min (ref >60)
Glucose, Bld: 507 mg/dL (ref 65–99)
Potassium: 4.2 mmol/L (ref 3.5–5.1)
Sodium: 130 mmol/L — ABNORMAL LOW (ref 135–145)
Total Bilirubin: 0.6 mg/dL (ref 0.3–1.2)
Total Protein: 7.3 g/dL (ref 6.5–8.1)

## 2017-11-23 LAB — CBC
HCT: 40.8 % (ref 36.0–46.0)
Hemoglobin: 14 g/dL (ref 12.0–15.0)
MCH: 29.7 pg (ref 26.0–34.0)
MCHC: 34.3 g/dL (ref 30.0–36.0)
MCV: 86.6 fL (ref 78.0–100.0)
Platelets: 206 10*3/uL (ref 150–400)
RBC: 4.71 MIL/uL (ref 3.87–5.11)
RDW: 12.4 % (ref 11.5–15.5)
WBC: 6.9 10*3/uL (ref 4.0–10.5)

## 2017-11-23 LAB — CBG MONITORING, ED
Glucose-Capillary: 434 mg/dL — ABNORMAL HIGH (ref 65–99)
Glucose-Capillary: 436 mg/dL — ABNORMAL HIGH (ref 65–99)

## 2017-11-23 LAB — CK: Total CK: 34 U/L — ABNORMAL LOW (ref 38–234)

## 2017-11-23 LAB — MAGNESIUM: Magnesium: 1.7 mg/dL (ref 1.7–2.4)

## 2017-11-23 MED ORDER — LORAZEPAM 2 MG/ML IJ SOLN
1.0000 mg | Freq: Once | INTRAMUSCULAR | Status: AC | PRN
Start: 2017-11-23 — End: 2017-11-23
  Administered 2017-11-23: 1 mg via INTRAVENOUS
  Filled 2017-11-23: qty 1

## 2017-11-23 MED ORDER — SODIUM CHLORIDE 0.9 % IV BOLUS
1000.0000 mL | Freq: Once | INTRAVENOUS | Status: AC
  Administered 2017-11-23: 1000 mL via INTRAVENOUS

## 2017-11-23 MED ORDER — MECLIZINE HCL 25 MG PO TABS
25.0000 mg | ORAL_TABLET | Freq: Once | ORAL | Status: AC
  Administered 2017-11-23: 25 mg via ORAL
  Filled 2017-11-23: qty 1

## 2017-11-23 NOTE — ED Triage Notes (Signed)
Patient came to ER today for leg pain that has been going on for months now. Patient has diabetic neuropathy and has been treated for it for years now. Now her legs have a dull ache and have sharpe pains in them. Also having cafe pain.  She feels it isnt her Neuropathy. It has gotten worse and worse. She has been out of her gabapentin for 2 weeks now.  Her headache started on Sunday and can not sleep. Patient has tried tylenol and Ibuprofen for her headache. Asking could the headache be bc she hasnt had her Medicine.

## 2017-11-23 NOTE — ED Provider Notes (Signed)
Signed out to me by Marigene Ehlers, PA-C.  Please see previous notes for further history.  In brief, patient presenting with a headache for the past 3 days.  Exam showed ataxia, she is pending MRI.  Patient has a history of diabetes for which she is noncompliant with her insulin due to not being able to afford it.  Case management is aware and has instructed patient to follow-up with [place] and wellness.    MRI without signs of stroke or other acute findings.  Discussed with patient. Pt reports sxs have improved.   Discussed follow-up with  and wellness.  At this time, patient appears safe for discharge.  Return precautions given.  Patient states she understands and agrees to plan.   Alveria Apley, PA-C 11/23/17 1840    Shaune Pollack, MD 11/24/17 1208

## 2017-11-23 NOTE — ED Notes (Signed)
Critical Value Saunders Revel, MD notified.

## 2017-11-23 NOTE — ED Provider Notes (Signed)
Medical screening examination/treatment/procedure(s) were conducted as a shared visit with non-physician practitioner(s) and myself.  I personally evaluated the patient during the encounter.  Patient here with multiple complaints to include bilateral calf pain, headache, hyperglycemia. Worsening for awhile but is not taking medication secondary to financial difficulty.  Exam with difficulty standing, non tender, non edematous calves, normal heart and lungs.  Plan to r/o stroke and metabolic abnormalities. If all normal stable for dc.    Marily Memos, MD 11/23/17 [phone]

## 2017-11-23 NOTE — Care Management Note (Signed)
Case Management Note  CM consulted for medication assistance with no ins.  CM noted pt was MATCHED July 2018 and would not qualify for this assistance again until after July 2019.  CM spoke with pt about [facility], financial counseling, pharmacy use, and other services once she schedules an appointment at 1 of the 2 clinics.  She acknowledged understanding with teach back.  Information placed on AVS.  CM sent a message to [facility] CM with pt's info in case an appointment becomes available before the pt can make one.  Updated McDonald, PA.  No further CM needs noted at this time.

## 2017-11-23 NOTE — ED Provider Notes (Signed)
[facility] DEPT Provider Note   CSN: [id number] Arrival date & time: 11/23/17  3903     History   Chief Complaint No chief complaint on file.   HPI [name] is a 47 y.o. female with a h/o of noncompliant IDDM, necrotizing fasciitis to the right thigh, and HTN who presents to the emergency department with multiple complaints.  The patient endorses a constant, worsening headache that extends from the bilateral forehead, across the top of her head, to the parietal scalp that began 2 days ago with associated dizziness and dysequilibrium.  Patient states that she almost fell over while changing from her clothes into her gown in the emergency department.  She is treated her symptoms with Tylenol without improvement.   She denies visual changes, numbness, weakness.  She has peripheral neuropathy at baseline, but no worse than baseline numbness.  Also endorses worsening pain in the bilateral calves for the last few months.  She characterizes the pain is aching.  States that pain is worse at night and better with moving her legs. She states that she will often pace at night because it improves the pain.  She denies redness or swelling.  She reports that she has not been able to afford her insulin for the last few months.  She also endorses polydipsia and polyuria.    She denies fever, chills, nausea, vomiting, diarrhea, or rash.   The history is provided by the patient. No language interpreter was used.    Past Medical History:  Diagnosis Date  . Arthritis    fingers  . Depression   . Diabetes mellitus   . Dyspnea 11/22/2013  . Hypertension   . Kidney stones   . PONV (postoperative nausea and vomiting)   . Sleep apnea    lost 70lbs no cpap x41yr now    Patient Active Problem List   Diagnosis Date Noted  . Wound infection 03/28/2017  . Necrotizing fasciitis right thigh s/p I&D 03/17/2017 03/18/2017  . Cellulitis of leg, right   .  Hyperglycemia   . Cellulitis 03/14/2017  . Hyponatremia 03/14/2017  . OSA (obstructive sleep apnea) 11/22/2013  . Dyspnea 11/22/2013  . Uncontrolled type 2 diabetes mellitus with hyperosmolar nonketotic hyperglycemia ([facility] 07/21/2012  . HTN (hypertension) 07/21/2012  . Smoker 07/21/2012    Past Surgical History:  Procedure Laterality Date  . CARDIAC CATHETERIZATION     4-522yrago  . CHOLECYSTECTOMY    . ENDOMETRIAL ABLATION     6 years ago  . IRRIGATION AND DEBRIDEMENT ABSCESS Right 03/17/2017   Procedure: IRRIGATION AND DEBRIDEMENT RIGHT THIGH ABSCESS;  Surgeon: GrMichael BostonMD;  Location: [facility];  Service: General;  Laterality: Right;  . LAPAROSCOPIC APPENDECTOMY  10/04/2011   Procedure: APPENDECTOMY LAPAROSCOPIC;  Surgeon: BrJudieth KeensDO;  Location: [facility];  Service: General;  Laterality: N/A;     OB History   None      Home Medications    Prior to Admission medications   Medication Sig Start Date End Date Taking? Authorizing Provider  acetaminophen (TYLENOL) 500 MG tablet Take 1-2 tablets (500-1,000 mg total) by mouth every 8 (eight) hours as needed for mild pain or moderate pain. Not to exceed 4,00044mn 24 hour period 03/21/17  Yes Choi, JenAnderson MaltaO  Barberry-Oreg Grape-Goldenseal (BERBERINE COMPLEX PO) Take 1 tablet by mouth daily.   Yes [provider]  esomeprazole (NEXIUM) 20 MG capsule Take 20 mg by mouth daily.    Yes [provider]  FLUoxetine (PROZAC) 40 MG capsule Take 40 mg by mouth daily.   Yes [provider]  gabapentin (NEURONTIN) 300 MG capsule Take 300 mg by mouth daily as needed (pain).    Yes [provider]  hydrOXYzine (ATARAX/VISTARIL) 10 MG tablet Take 10 mg by mouth at bedtime as needed for sleep. 06/02/17  Yes [provider]  magnesium oxide (MAG-OX) 400 MG tablet Take 400 mg by mouth daily.   Yes [provider]  Multiple Vitamin (MULTIVITAMIN WITH MINERALS) TABS tablet Take 1 tablet  by mouth daily.   Yes [provider]  blood glucose meter kit and supplies Dispense based on patient and insurance preference. Use up to four times daily as directed. (FOR ICD-9 250.00, 250.01). Patient not taking: Reported on 11/23/2017 03/21/17   Dessa Phi, DO  ciprofloxacin (CIPRO) 500 MG tablet Take 1 tablet (500 mg total) 2 (two) times daily by mouth. Patient not taking: Reported on 11/23/2017 07/07/17   Robinson, [place] N, PA-C  famotidine (PEPCID) 20 MG tablet Take 1 tablet (20 mg total) 2 (two) times daily by mouth. Patient not taking: Reported on 11/23/2017 07/07/17   Robinson, [place] N, PA-C  insulin aspart (NOVOLOG) 100 UNIT/ML injection Novolog is your short-acting correction insulin. Check your blood sugar prior to your meal. Then depending on your blood sugar, inject insulin as follows: CBG 121 - 150: 3 units CBG 151 - 200: 4 units CBG 201 - 250: 7 units CBG 251 - 300: 11 units CBG 301 - 350: 15 units CBG 351 - 400: 20 units Patient not taking: Reported on 11/23/2017 03/21/17 03/21/18  Dessa Phi, DO  insulin aspart protamine- aspart (NOVOLOG MIX 70/30) (70-30) 100 UNIT/ML injection Inject 0.25 mLs (25 Units total) into the skin 2 (two) times daily with a meal. Patient not taking: Reported on 11/23/2017 03/21/17   Dessa Phi, DO  Insulin Syringe-Needle U-100 (INSULIN SYRINGE .3CC/31GX5/16") 31G X 5/16" 0.3 ML MISC Use with insulin, up to 5 times daily 03/21/17   Dessa Phi, DO  oxyCODONE (OXY IR/ROXICODONE) 5 MG immediate release tablet Take 2-3 tablets (10-15 mg total) by mouth daily as needed (with dressing changes). Patient not taking: Reported on 11/23/2017 03/30/17   Patrecia Pour, MD  psyllium (HYDROCIL/METAMUCIL) 95 % PACK Take 1 packet by mouth 2 (two) times daily. Patient not taking: Reported on 11/23/2017 03/21/17   Dessa Phi, DO    Family History Family History  Problem Relation Age of Onset  . Aneurysm Mother   . Heart attack Father   . Stroke  Father   . Breast cancer Paternal Grandmother     Social History Social History   Tobacco Use  . Smoking status: Current Every Day Smoker    Packs/day: 1.00    Years: 24.00    Pack years: 24.00    Types: Cigarettes  . Smokeless tobacco: Never Used  Substance Use Topics  . Alcohol use: Yes    Comment: seldom  . Drug use: No    Types: Marijuana    Comment: 25 years ago 11/22/13     Allergies   Codeine and Sulfa antibiotics   Review of Systems Review of Systems  Constitutional: Negative for chills, diaphoresis and fever.  HENT: Negative for congestion.   Eyes: Negative for visual disturbance.  Respiratory: Negative for cough and shortness of breath.   Cardiovascular: Negative for chest pain.  Gastrointestinal: Negative for abdominal pain, nausea and vomiting.  Endocrine: Positive for polydipsia and polyuria.  Genitourinary: Negative for flank pain and urgency.  Musculoskeletal: Negative for back pain, gait problem, neck pain and neck stiffness.  Skin: Negative for rash and wound.  Allergic/Immunologic: Positive for immunocompromised state.  Neurological: Positive for dizziness and headaches. Negative for syncope, speech difficulty, weakness, light-headedness and numbness.       Dysequilibrium    Physical Exam Updated Vital Signs BP 103/69   Pulse 88   Temp 98.5 F (36.9 C) (Oral)   Resp 16   Ht 5' 9" (1.753 m)   Wt 83.9 kg (185 lb)   SpO2 96%   BMI 27.32 kg/m   Physical Exam  Constitutional: She is oriented to person, place, and time. No distress.  HENT:  Head: Normocephalic.  Right Ear: Hearing, tympanic membrane and ear canal normal.  Left Ear: Hearing, tympanic membrane and ear canal normal.  Nose: Right sinus exhibits no maxillary sinus tenderness and no frontal sinus tenderness. Left sinus exhibits frontal sinus tenderness. Left sinus exhibits no maxillary sinus tenderness.  Mouth/Throat: Uvula is midline, oropharynx is clear and moist and mucous  membranes are normal. No tonsillar exudate.  Eyes: Pupils are equal, round, and reactive to light. Conjunctivae and EOM are normal. No scleral icterus.  Neck: Normal range of motion. Neck supple. No JVD present.  No meningismus.  Cardiovascular: Normal rate, regular rhythm, normal heart sounds and intact distal pulses. Exam reveals no gallop and no friction rub.  No murmur heard. Pulmonary/Chest: Effort normal. No respiratory distress. She has no wheezes. She has no rales. She exhibits no tenderness.  Abdominal: Soft. Bowel sounds are normal. She exhibits no distension and no mass. There is no tenderness. There is no rebound and no guarding. No hernia.  Neurological: She is alert and oriented to person, place, and time.  Positive Romberg.  Cranial nerves 2-12 grossly intact. Finger-to-nose is normal. 5/5 motor strength of the bilateral upper and lower extremities. Moves all four extremities. NVI.    Skin: Skin is warm. Capillary refill takes less than 2 seconds. No rash noted. She is not diaphoretic.  Psychiatric: Her behavior is normal.  Nursing note and vitals reviewed.    ED Treatments / Results  Labs (all labs ordered are listed, but only abnormal results are displayed) Labs Reviewed  COMPREHENSIVE METABOLIC PANEL - Abnormal; Notable for the following components:      Result Value   Sodium 130 (*)    Chloride 93 (*)    Glucose, Bld 507 (*)    Creatinine, Ser 1.06 (*)    Alkaline Phosphatase 202 (*)    All other components within normal limits  CK - Abnormal; Notable for the following components:   Total CK 34 (*)    All other components within normal limits  URINALYSIS, ROUTINE W REFLEX MICROSCOPIC - Abnormal; Notable for the following components:   Glucose, UA >=500 (*)    Hgb urine dipstick SMALL (*)    Ketones, ur 20 (*)    Leukocytes, UA LARGE (*)    Bacteria, UA RARE (*)    Squamous Epithelial / LPF 0-5 (*)    All other components within normal limits  BLOOD GAS,  VENOUS - Abnormal; Notable for the following components:   pH, Ven 7.439 (*)    pCO2, Ven 36.5 (*)    pO2, Ven 59.2 (*)    All other components within normal limits  CBG MONITORING, ED - Abnormal; Notable for the following components:   Glucose-Capillary 434 (*)    All other components  within normal limits  CBC  MAGNESIUM    EKG None  Radiology Ct Head Wo Contrast  Result Date: 11/23/2017 CLINICAL DATA:  Headache.  Lower extremity numbness EXAM: CT HEAD WITHOUT CONTRAST TECHNIQUE: Contiguous axial images were obtained from the base of the skull through the vertex without intravenous contrast. COMPARISON:  Head CT April 29, 2005; brain MRI Jan 14, 2016 FINDINGS: Brain: The ventricles are normal in size and configuration. There is no intracranial mass, hemorrhage, extra-axial fluid collection, or midline shift. The gray-white compartments appear unremarkable. No evident acute infarct. Vascular: No hyperdense vessel. There is calcification in the right carotid siphon region. Skull: Bony calvarium appears intact. Sinuses/Orbits: Is visualized paranasal sinuses are clear. Orbits appear symmetric bilaterally. Other: Mastoid air cells are clear. IMPRESSION: Slight arterial vascular calcification. Study otherwise unremarkable. Electronically Signed   By: Lowella Grip III M.D.   On: 11/23/2017 14:27    Procedures Procedures (including critical care time)  Medications Ordered in ED Medications  sodium chloride 0.9 % bolus 1,000 mL (1,000 mLs Intravenous New Bag/Given 11/23/17 1515)  meclizine (ANTIVERT) tablet 25 mg (25 mg Oral Given 11/23/17 1513)  LORazepam (ATIVAN) injection 1 mg (1 mg Intravenous Given 11/23/17 1513)     Initial Impression / Assessment and Plan / ED Course  I have reviewed the triage vital signs and the nursing notes.  Pertinent labs & imaging results that were available during my care of the patient were reviewed by me and considered in my medical decision making  (see chart for details).     47 year old female with a h/o of noncompliant IDDM, necrotizing fasciitis to the right thigh, and HTN presenting with headache, dizziness, disequilibrium, bilateral calf pain, polyuria, and polydipsia.  She has been out of insulin for her diabetes for several months.  Glucose 507.  Anion gap is normal. IVF bolus given. No signs/symptoms of DKA.   She also endorses bilateral calf pain, worse at night, pain improves with movement. Suspect restless leg syndrome.  Discussed treatment for this and further workup should be initiated with primary care.  Case management consulted and the patient has been referred to [facility] and wellness  to initiate primary care and pharmacy services.  On physical exam, patient has a positive Romberg.  Meclizine given for dizziness.  Given recent headache, dizziness, and focal neurologic findings, MRI brain ordered.  PRN Ativan given for anxiety. MR brain pending.   Patient care transferred to PA [place] at the end of my shift pending MR brain. If negative, she can be discharged to home with outpatient follow-up.  Patient presentation, ED course, and plan of care discussed with review of all pertinent labs and imaging. Please see his/her note for further details regarding further ED course and disposition.  Final Clinical Impressions(s) / ED Diagnoses   Final diagnoses:  Bilateral calf pain  Type 2 diabetes mellitus with hyperglycemia, with long-term current use of insulin [facility])    ED Discharge Orders    None       Joanne Gavel, PA-C 11/23/17 1644    Mesner, Corene Cornea, MD 11/23/17 1821

## 2017-11-23 NOTE — Discharge Instructions (Addendum)
For your leg pain, there is several over the counter products such as Ryland's restless leg that may provide you with some relief. You may try magnesium acetate supplementation.  Make sure you are staying well hydrated.  It is important that you follow up with [facility] and wellness for further management of your restless leg and diabetes.  Return to the ER if you develop chest pain, difficulty breathing, confusion, or any new or concerning symptoms.

## 2017-11-24 ENCOUNTER — Telehealth: Payer: Self-pay

## 2017-11-24 MED FILL — hydrOXYzine HCL 10 MG TABS: 10 | 30 days supply | Qty: 30 | Fill #0

## 2017-11-24 MED FILL — LISINOPRIL 20 MG TAB: 20 | 30 days supply | Qty: 30 | Fill #0

## 2017-11-24 MED FILL — MELOXICAM 7.5 MG TABLET: 7.5 | 30 days supply | Qty: 30 | Fill #0

## 2017-11-24 MED FILL — NOVOLOG FLEXPEN SYRINGE: 100 | 30 days supply | Qty: 9 | Fill #0

## 2017-11-24 MED FILL — LEVEMIR FLEXTOUCH 100 UNITS: 100 | 25 days supply | Qty: 15 | Fill #0

## 2017-11-24 MED FILL — MUPIROCIN 2% OINTMENT: 2 | 14 days supply | Qty: 22 | Fill #0

## 2017-11-24 MED FILL — GABAPENTIN 300 MG CAPSULE: 300 | 30 days supply | Qty: 90 | Fill #0

## 2017-11-24 NOTE — Telephone Encounter (Signed)
Message received from Eldridge Abrahams, RN CM noting that the patient has been instructed to contact [facility] or [facility] to schedule an appointment.   No appointments available at [facility] at this time.   Update provided to A. Kritzer, RN CM. The patient has not scheduled an appointment yet.

## 2017-12-10 ENCOUNTER — Other Ambulatory Visit: Payer: Self-pay

## 2017-12-10 ENCOUNTER — Ambulatory Visit: Payer: Self-pay | Attending: Internal Medicine | Admitting: Physician Assistant

## 2017-12-10 VITALS — BP 110/76 | HR 84 | Temp 98.0°F | Resp 18 | Ht 69.0 in | Wt 201.4 lb

## 2017-12-10 DIAGNOSIS — M726 Necrotizing fasciitis: Secondary | ICD-10-CM | POA: Insufficient documentation

## 2017-12-10 DIAGNOSIS — M79662 Pain in left lower leg: Secondary | ICD-10-CM

## 2017-12-10 DIAGNOSIS — R0600 Dyspnea, unspecified: Secondary | ICD-10-CM | POA: Insufficient documentation

## 2017-12-10 DIAGNOSIS — Z87442 Personal history of urinary calculi: Secondary | ICD-10-CM | POA: Insufficient documentation

## 2017-12-10 DIAGNOSIS — I10 Essential (primary) hypertension: Secondary | ICD-10-CM | POA: Insufficient documentation

## 2017-12-10 DIAGNOSIS — F329 Major depressive disorder, single episode, unspecified: Secondary | ICD-10-CM

## 2017-12-10 DIAGNOSIS — F419 Anxiety disorder, unspecified: Secondary | ICD-10-CM

## 2017-12-10 DIAGNOSIS — Z882 Allergy status to sulfonamides status: Secondary | ICD-10-CM | POA: Insufficient documentation

## 2017-12-10 DIAGNOSIS — Z885 Allergy status to narcotic agent status: Secondary | ICD-10-CM | POA: Insufficient documentation

## 2017-12-10 DIAGNOSIS — Z791 Long term (current) use of non-steroidal anti-inflammatories (NSAID): Secondary | ICD-10-CM | POA: Insufficient documentation

## 2017-12-10 DIAGNOSIS — Z9889 Other specified postprocedural states: Secondary | ICD-10-CM | POA: Insufficient documentation

## 2017-12-10 DIAGNOSIS — E118 Type 2 diabetes mellitus with unspecified complications: Secondary | ICD-10-CM

## 2017-12-10 DIAGNOSIS — R42 Dizziness and giddiness: Secondary | ICD-10-CM | POA: Insufficient documentation

## 2017-12-10 DIAGNOSIS — R51 Headache: Secondary | ICD-10-CM | POA: Insufficient documentation

## 2017-12-10 DIAGNOSIS — Z794 Long term (current) use of insulin: Secondary | ICD-10-CM

## 2017-12-10 DIAGNOSIS — F32A Depression, unspecified: Secondary | ICD-10-CM

## 2017-12-10 DIAGNOSIS — Z803 Family history of malignant neoplasm of breast: Secondary | ICD-10-CM | POA: Insufficient documentation

## 2017-12-10 DIAGNOSIS — Z79899 Other long term (current) drug therapy: Secondary | ICD-10-CM | POA: Insufficient documentation

## 2017-12-10 DIAGNOSIS — Z9049 Acquired absence of other specified parts of digestive tract: Secondary | ICD-10-CM | POA: Insufficient documentation

## 2017-12-10 DIAGNOSIS — M1990 Unspecified osteoarthritis, unspecified site: Secondary | ICD-10-CM | POA: Insufficient documentation

## 2017-12-10 DIAGNOSIS — E1165 Type 2 diabetes mellitus with hyperglycemia: Secondary | ICD-10-CM | POA: Insufficient documentation

## 2017-12-10 DIAGNOSIS — Z823 Family history of stroke: Secondary | ICD-10-CM | POA: Insufficient documentation

## 2017-12-10 DIAGNOSIS — R350 Frequency of micturition: Secondary | ICD-10-CM | POA: Insufficient documentation

## 2017-12-10 DIAGNOSIS — G4730 Sleep apnea, unspecified: Secondary | ICD-10-CM | POA: Insufficient documentation

## 2017-12-10 DIAGNOSIS — M79661 Pain in right lower leg: Secondary | ICD-10-CM

## 2017-12-10 DIAGNOSIS — Z8249 Family history of ischemic heart disease and other diseases of the circulatory system: Secondary | ICD-10-CM | POA: Insufficient documentation

## 2017-12-10 LAB — POCT GLYCOSYLATED HEMOGLOBIN (HGB A1C): Hemoglobin A1C: >15

## 2017-12-10 LAB — GLUCOSE, POCT (MANUAL RESULT ENTRY): POC Glucose: 183 mg/dl — AB (ref 70–99)

## 2017-12-10 MED ORDER — MELOXICAM 7.5 MG PO TABS: 7.5000 mg | ORAL_TABLET | Freq: Every day | ORAL | 1 refills | Status: DC

## 2017-12-10 MED ORDER — INSULIN ASPART 100 UNIT/ML ~~LOC~~ SOLN: SUBCUTANEOUS | 0 refills | Status: DC

## 2017-12-10 MED ORDER — FLUOXETINE HCL 40 MG PO CAPS: 40.0000 mg | ORAL_CAPSULE | Freq: Every day | ORAL | 11 refills | Status: DC

## 2017-12-10 MED ORDER — GABAPENTIN 300 MG PO CAPS: 600.0000 mg | ORAL_CAPSULE | Freq: Two times a day (BID) | ORAL | 2 refills | Status: DC

## 2017-12-10 MED ORDER — MUPIROCIN 2 % EX OINT: 1.0000 "application " | TOPICAL_OINTMENT | Freq: Two times a day (BID) | CUTANEOUS | 0 refills | Status: DC

## 2017-12-10 MED ORDER — LISINOPRIL 20 MG PO TABS: 20.0000 mg | ORAL_TABLET | Freq: Every day | ORAL | 3 refills | Status: DC

## 2017-12-10 MED ORDER — INSULIN DETEMIR 100 UNIT/ML FLEXPEN: 60.0000 [IU] | PEN_INJECTOR | Freq: Every day | SUBCUTANEOUS | 11 refills | Status: DC

## 2017-12-10 MED ORDER — HYDROXYZINE HCL 10 MG PO TABS: 10.0000 mg | ORAL_TABLET | Freq: Three times a day (TID) | ORAL | 2 refills | Status: DC | PRN

## 2017-12-10 MED FILL — GABAPENTIN 300 MG CAPSULE: 300 | 30 days supply | Qty: 120 | Fill #0

## 2017-12-10 MED FILL — FLUoxetine HCL 40 MG CAPS: 40 | 30 days supply | Qty: 30 | Fill #0

## 2017-12-10 NOTE — Progress Notes (Signed)
[name]  QBV:[id number]  KCM:[id number]  DOB - August 23, 1971  Chief Complaint  Patient presents with  . Hospitalization Follow-up  . Diabetes       Subjective:   [name] is a 47 y.o. female here today for establishment of care. She has a past medical history of insulin-dependent diabetes mellitus, necrotizing fasciitis of the right thigh, hypertension, depression mixed with anxiety, remote toward arthritis and smokes. She has been lost to follow-up for at least 6 months. She has no insurance coverage. She went to the emergency department on 11/23/2017 with multiple complaints. The first was bilateral calf pain for months. Located to the back of her leg. Temporary relief with over-the-counter medications.  The next complaint was a significant headache with associated dizziness for several days.  The next complaint was hyperglycemia. She has been having polydipsia and polyuria. She had been out of her diabetes medications for several months.  In the emergency department vital signs were stable. Sodium 1:30. Glucose greater than 500. Creatinine 1.0. Urinalysis with some leukocytes and some ketones. She was treated with normal saline, headache and Cox and and Ativan. A CT scan of her head and MRI of her brain were both unremarkable. Refills were provided in regards to her diabetes and chronic medications and she was sent here for further evaluation.  She states that the calf pain has improved with treatment of her diabetes. She was placed on Levemir 60 units at night which is the last known regimen that she was on. She still having pins and needle sensation on the bottom of her feet. She has aches in her joints multiple times throughout the day. Her frequent urination has improved. She had dropped about 86 pounds within the last year likely from hyperglycemia. Now that her insulin has restarted she's gained at least 15 back. She is not very about this. +blurred vision.  ROS: GEN:  denies fever or chills, + change in weight Skin: denies lesions or rashes HEENT: denies headache, earache, epistaxis, sore throat, or neck pain +blurred vision LUNGS: denies SHOB, dyspnea, PND, orthopnea CV: denies CP or palpitations ABD: denies abd pain, N or V EXT:+muscle spasms or swelling; no pain in lower ext, no weakness NEURO: + numbness or tingling, denies sz, stroke or TIA   ALLERGIES: Allergies  Allergen Reactions  . Codeine Itching  . Sulfa Antibiotics Hives    PAST MEDICAL HISTORY: Past Medical History:  Diagnosis Date  . Arthritis    fingers  . Depression   . Diabetes mellitus   . Dyspnea 11/22/2013  . Hypertension   . Kidney stones   . PONV (postoperative nausea and vomiting)   . Sleep apnea    lost 70lbs no cpap x4yrs now    PAST SURGICAL HISTORY: Past Surgical History:  Procedure Laterality Date  . CARDIAC CATHETERIZATION     4-35yrs ago  . CHOLECYSTECTOMY    . ENDOMETRIAL ABLATION     6 years ago  . IRRIGATION AND DEBRIDEMENT ABSCESS Right 03/17/2017   Procedure: IRRIGATION AND DEBRIDEMENT RIGHT THIGH ABSCESS;  Surgeon: Karie Soda, MD;  Location: [facility];  Service: General;  Laterality: Right;  . LAPAROSCOPIC APPENDECTOMY  10/04/2011   Procedure: APPENDECTOMY LAPAROSCOPIC;  Surgeon: Rulon Abide, DO;  Location: [facility];  Service: General;  Laterality: N/A;    MEDICATIONS AT HOME: Prior to Admission medications   Medication Sig Start Date End Date Taking? Authorizing Provider  esomeprazole (NEXIUM) 20 MG capsule Take 20 mg by mouth daily.  Yes [provider]  famotidine (PEPCID) 20 MG tablet Take 1 tablet (20 mg total) 2 (two) times daily by mouth. 07/07/17  Yes Robinson, [place] N, PA-C  FLUoxetine (PROZAC) 40 MG capsule Take 1 capsule (40 mg total) by mouth daily. 12/10/17  Yes Danelle Earthly, Mahayla Haddaway S, PA-C  gabapentin (NEURONTIN) 300 MG capsule Take 2 capsules (600 mg total) by mouth 2 (two) times daily. 12/10/17  Yes Danelle Earthly, Shaton Lore S, PA-C    hydrOXYzine (ATARAX/VISTARIL) 10 MG tablet Take 10 mg by mouth at bedtime as needed for sleep. 06/02/17  Yes [provider]  Multiple Vitamin (MULTIVITAMIN WITH MINERALS) TABS tablet Take 1 tablet by mouth daily.   Yes [provider]  Barberry-Oreg Grape-Goldenseal (BERBERINE COMPLEX PO) Take 1 tablet by mouth daily.    [provider]  hydrOXYzine (ATARAX/VISTARIL) 10 MG tablet Take 1 tablet (10 mg total) by mouth 3 (three) times daily as needed. 12/23/17   Vivianne Master, PA-C  insulin aspart (NOVOLOG) 100 UNIT/ML injection Novolog is your short-acting correction insulin. Check your blood sugar prior to your meal. Then depending on your blood sugar, inject insulin as follows: CBG 121 - 150: 3 units CBG 151 - 200: 4 units CBG 201 - 250: 7 units CBG 251 - 300: 11 units CBG 301 - 350: 15 units CBG 351 - 400: 20 units 12/10/17 12/10/18  Vivianne Master, PA-C  Insulin Detemir (LEVEMIR FLEXTOUCH) 100 UNIT/ML Pen Inject 60 Units into the skin daily at 10 pm. 12/10/17   Vivianne Master, PA-C  lisinopril (PRINIVIL,ZESTRIL) 20 MG tablet Take 1 tablet (20 mg total) by mouth daily. 12/23/17   Vivianne Master, PA-C  magnesium oxide (MAG-OX) 400 MG tablet Take 400 mg by mouth daily.    [provider]  meloxicam (MOBIC) 7.5 MG tablet Take 1 tablet (7.5 mg total) by mouth daily. 12/10/17   Vivianne Master, PA-C  mupirocin ointment (BACTROBAN) 2 % Place 1 application into the nose 2 (two) times daily. 12/10/17   Vivianne Master, PA-C    Family History  Problem Relation Age of Onset  . Aneurysm Mother   . Heart attack Father   . Stroke Father   . Breast cancer Paternal Grandmother     Social-unmarried, not working, smokes some  Objective:   Vitals:   12/10/17 0858  BP: 110/76  Pulse: 84  Resp: 18  Temp: 98 F (36.7 C)  TempSrc: Oral  SpO2: 99%  Weight: 201 lb 6.4 oz (91.4 kg)  Height: 5\' 9"  (1.753 m)    Exam-benign General appearance : Awake, alert, not  in any distress. Speech Clear. Not toxic looking HEENT: Atraumatic and Normocephalic, pupils equally reactive to light and accomodation Neck: supple, no JVD. No cervical lymphadenopathy.  Chest:Good air entry bilaterally, no added sounds  CVS: S1 S2 regular, no murmurs.  Abdomen: Bowel sounds present, Non tender and not distended with no guarding, rigidity or rebound. Extremities: B/L Lower Ext shows no edema, both legs are warm to touch Neurology: Awake alert, and oriented X 3, CN II-XII intact, Non focal Skin:No Rash Wounds:N/A  Data Review Lab Results  Component Value Date   HGBA1C >15 12/10/2017   HGBA1C 15.0 (H) 03/15/2017   HGBA1C 9.2 (H) 11/07/2012     Assessment & Plan  1. Bilateral calf pain-improved  -likely aggrevated by hyperglycemia  -no evidence of DVT  -gabapentin  2. IDDM  -refills provided   -encouraged to keep a log and bring to next  appt (going to get new test  strips today)  -Aim for 30 minutes of exercise most days. Rethink what you drink. Water is great! Aim for 2-3 Carb Choices per meal (30-45 grams) +/- 1 either way  Aim for 0-15 Carbs per snack if hungry  Include protein in moderation with your meals and snacks  Consider reading food labels for Total Carbohydrate and Fat Grams of foods  Consider checking BG at alternate times per day  Continue taking medication as directed Be mindful about how much sugar you are adding to beverages and other foods. Fruit Punch - find one with no sugar  Measure and decrease portions of carbohydrate foods  Make your plate and don't go back for seconds  3. HTN  -cont Lisiniopril  -low salt diet  4. Joint aches/pains/RM  -Meloxicam refill  5. Depression m/w anxiety  -Fluoxetine refill   Return in about 2 weeks (around 12/24/2017).  The patient was given clear instructions to go to ER or return to medical center if symptoms don't improve, worsen or new problems develop. The patient verbalized  understanding. The patient was told to call to get lab results if they haven't heard anything in the next week.   Total time spent with patient was 32 min. Greater than 50 % of this visit was spent face to face counseling and coordinating care regarding risk factor modification, compliance importance and encouragement, education related to diabetes.  This note has been created with Education officer, environmental. Any transcriptional errors are unintentional.    Scot Jun, PA-C [facility] and [facility] [place], [place] [phone]   12/10/2017, 9:40 AM

## 2017-12-10 NOTE — Progress Notes (Signed)
Patient stated that the day before yesterday her BS was 83 went down to 61 she drank an apple juice check it again it was 120

## 2017-12-22 ENCOUNTER — Ambulatory Visit: Payer: Self-pay | Attending: Family Medicine

## 2017-12-30 ENCOUNTER — Ambulatory Visit: Payer: Self-pay | Attending: Internal Medicine | Admitting: Internal Medicine

## 2017-12-30 ENCOUNTER — Inpatient Hospital Stay (HOSPITAL_COMMUNITY): Admission: RE | Admit: 2017-12-30 | Payer: Self-pay | Source: Ambulatory Visit

## 2017-12-30 ENCOUNTER — Encounter: Payer: Self-pay | Admitting: Internal Medicine

## 2017-12-30 VITALS — BP 122/85 | HR 75 | Temp 98.0°F | Resp 16 | Wt 199.4 lb

## 2017-12-30 DIAGNOSIS — Z882 Allergy status to sulfonamides status: Secondary | ICD-10-CM | POA: Insufficient documentation

## 2017-12-30 DIAGNOSIS — B001 Herpesviral vesicular dermatitis: Secondary | ICD-10-CM

## 2017-12-30 DIAGNOSIS — G4733 Obstructive sleep apnea (adult) (pediatric): Secondary | ICD-10-CM | POA: Insufficient documentation

## 2017-12-30 DIAGNOSIS — IMO0002 Reserved for concepts with insufficient information to code with codable children: Secondary | ICD-10-CM

## 2017-12-30 DIAGNOSIS — L03115 Cellulitis of right lower limb: Secondary | ICD-10-CM | POA: Insufficient documentation

## 2017-12-30 DIAGNOSIS — I10 Essential (primary) hypertension: Secondary | ICD-10-CM | POA: Insufficient documentation

## 2017-12-30 DIAGNOSIS — N898 Other specified noninflammatory disorders of vagina: Secondary | ICD-10-CM

## 2017-12-30 DIAGNOSIS — M726 Necrotizing fasciitis: Secondary | ICD-10-CM | POA: Insufficient documentation

## 2017-12-30 DIAGNOSIS — Z1231 Encounter for screening mammogram for malignant neoplasm of breast: Secondary | ICD-10-CM

## 2017-12-30 DIAGNOSIS — E871 Hypo-osmolality and hyponatremia: Secondary | ICD-10-CM | POA: Insufficient documentation

## 2017-12-30 DIAGNOSIS — Z79899 Other long term (current) drug therapy: Secondary | ICD-10-CM | POA: Insufficient documentation

## 2017-12-30 DIAGNOSIS — Z794 Long term (current) use of insulin: Secondary | ICD-10-CM | POA: Insufficient documentation

## 2017-12-30 DIAGNOSIS — R1011 Right upper quadrant pain: Secondary | ICD-10-CM

## 2017-12-30 DIAGNOSIS — M130 Polyarthritis, unspecified: Secondary | ICD-10-CM

## 2017-12-30 DIAGNOSIS — Z885 Allergy status to narcotic agent status: Secondary | ICD-10-CM | POA: Insufficient documentation

## 2017-12-30 DIAGNOSIS — F17210 Nicotine dependence, cigarettes, uncomplicated: Secondary | ICD-10-CM | POA: Insufficient documentation

## 2017-12-30 DIAGNOSIS — E1165 Type 2 diabetes mellitus with hyperglycemia: Secondary | ICD-10-CM | POA: Insufficient documentation

## 2017-12-30 DIAGNOSIS — Z1239 Encounter for other screening for malignant neoplasm of breast: Secondary | ICD-10-CM

## 2017-12-30 DIAGNOSIS — E1142 Type 2 diabetes mellitus with diabetic polyneuropathy: Secondary | ICD-10-CM | POA: Insufficient documentation

## 2017-12-30 LAB — GLUCOSE, POCT (MANUAL RESULT ENTRY): POC Glucose: 315 mg/dl — AB (ref 70–99)

## 2017-12-30 MED ORDER — DULAGLUTIDE 0.75 MG/0.5ML ~~LOC~~ SOAJ: SUBCUTANEOUS | 5 refills | Status: DC

## 2017-12-30 MED ORDER — FLUCONAZOLE 150 MG PO TABS: 150.0000 mg | ORAL_TABLET | Freq: Once | ORAL | 0 refills | Status: AC

## 2017-12-30 MED ORDER — MELOXICAM 7.5 MG PO TABS: 7.5000 mg | ORAL_TABLET | Freq: Every day | ORAL | 6 refills | Status: DC

## 2017-12-30 MED ORDER — GABAPENTIN 300 MG PO CAPS: 900.0000 mg | ORAL_CAPSULE | Freq: Two times a day (BID) | ORAL | 6 refills | Status: DC

## 2017-12-30 MED ORDER — TETANUS-DIPHTH-ACELL PERTUSSIS 5-2.5-18.5 LF-MCG/0.5 IM SUSP: 0.5000 mL | Freq: Once | INTRAMUSCULAR | 0 refills | Status: AC

## 2017-12-30 MED FILL — LEVEMIR FLEXTOUCH 100 UNITS: 100 | 30 days supply | Qty: 18 | Fill #0

## 2017-12-30 MED FILL — !TRULICITY 0.75 MG/0.5 ML P: 0.75 | 28 days supply | Qty: 1 | Fill #0

## 2017-12-30 MED FILL — MELOXICAM 7.5 MG TABLET: 7.5 | 30 days supply | Qty: 30 | Fill #0

## 2017-12-30 MED FILL — GABAPENTIN 300 MG CAPSULE: 300 | 30 days supply | Qty: 180 | Fill #0

## 2017-12-30 MED FILL — FLUCONAZOLE 150 MG TABS: 150 | 1 days supply | Qty: 1 | Fill #0

## 2017-12-30 NOTE — Progress Notes (Signed)
Pt states her skin is on fire. There is a stabbing feeling. Her skin does itch but there is no rash  Pt states these symptoms has been going on for 2 weeks  Pt states she looked her symptoms up and she read about shingles and she also has sore on her

## 2017-12-30 NOTE — Progress Notes (Signed)
Patient ID: [name], female    DOB: 09/04/1970  MRN: [id number]  CC: Establish Care and Diabetes   Subjective: [name] is a 47 y.o. female who presents for chronic ds management and to establish with me as PCP Her concerns today include:  Hx of DM type 2 with peripheral neuropathy, necrotizing fasciitis Rt thigh 03/2017, HTN, dep/anx, tob dep Patient was seen by physician assistant Scot Jun month post ER visit.  Patient had been out of all medications and needed to establish care with a PCP.  Previous PCP was at [facility].  1.  C/o pain "on skin"of LU abdominal wall  x 2 wks.  "Burning, itching and painful at times to the point of making me catch my breath.  This is new, odd and painful."  The discomfort is superficial and the skin is very sensitive to touch.  No rash, fever. No initiating factors.  No abdominal pain with meals.  Wants to know if she can have shingles on the inside of the skin in this area that just has not manifest itself as yet on the outside    -Also reports having a fever blister on lower lip right side.  She gets them intermittently but has not had one in quite some time.  2.  Diagnosed with DM x 14-15 yrs. Initially on oral meds but eventually changed to insulin.. Not taking Novolog but compliant with Levemir 60 units daily.  Wants to know if there is any new med that she takes just once a day or even once a week.  Even though she has the NovoLog pen she finds it very inconvenient having to give herself the shots.. Checks BS once a day to once every 2-3 days.  Fluctuates 90-380. Eating habits:  "not as good as I should.I know I shouldn't eatt some of the things I do."  Loss 100 lbs in the past 3 yrs while off all DM meds at the time.  3.  Vag disch x 4 days.  Yellow.  + itching.  Active with one partner. Last pap was 1 yr ago at [facility]. G3P1 (abortion and loss fetus)  4.  She is requesting refill on meloxicam which  she states she takes for rheumatoid arthritis.  Dx with RA 5 yrs ago by PCP at [facility].  She has never seen a rheumatologist.  Symptoms were mainly in hands, wrists and knees.  Reports symptoms are controlled on meloxicam.    I tried to clarify with her whether it is osteoarthritis versus rheumatoid arthritis but patient states that she thinks it is rheumatoid arthritis.  Patient Active Problem List   Diagnosis Date Noted  . Wound infection 03/28/2017  . Necrotizing fasciitis right thigh s/p I&D 03/17/2017 03/18/2017  . Cellulitis of leg, right   . Hyperglycemia   . Cellulitis 03/14/2017  . Hyponatremia 03/14/2017  . OSA (obstructive sleep apnea) 11/22/2013  . Dyspnea 11/22/2013  . Uncontrolled type 2 diabetes mellitus with hyperosmolar nonketotic hyperglycemia (HCC) 07/21/2012  . HTN (hypertension) 07/21/2012  . Smoker 07/21/2012     Current Outpatient Medications on File Prior to Visit  Medication Sig Dispense Refill  . Barberry-Oreg Grape-Goldenseal (BERBERINE COMPLEX PO) Take 1 tablet by mouth daily.    Marland Kitchen esomeprazole (NEXIUM) 20 MG capsule Take 20 mg by mouth daily.     . famotidine (PEPCID) 20 MG tablet Take 1 tablet (20 mg total) 2 (two) times daily by  mouth. 30 tablet 0  . FLUoxetine (PROZAC) 40 MG capsule Take 1 capsule (40 mg total) by mouth daily. 30 capsule 11  . hydrOXYzine (ATARAX/VISTARIL) 10 MG tablet Take 10 mg by mouth at bedtime as needed for sleep.  1  . hydrOXYzine (ATARAX/VISTARIL) 10 MG tablet Take 1 tablet (10 mg total) by mouth 3 (three) times daily as needed. 30 tablet 2  . Insulin Detemir (LEVEMIR FLEXTOUCH) 100 UNIT/ML Pen Inject 60 Units into the skin daily at 10 pm. 15 mL 11  . lisinopril (PRINIVIL,ZESTRIL) 20 MG tablet Take 1 tablet (20 mg total) by mouth daily. 30 tablet 3  . magnesium oxide (MAG-OX) 400 MG tablet Take 400 mg by mouth daily.    . Multiple Vitamin (MULTIVITAMIN WITH MINERALS) TABS tablet Take 1 tablet by mouth daily.    .  mupirocin ointment (BACTROBAN) 2 % Place 1 application into the nose 2 (two) times daily. 22 g 0   No current facility-administered medications on file prior to visit.     Allergies  Allergen Reactions  . Codeine Itching  . Sulfa Antibiotics Hives    Social History   Socioeconomic History  . Marital status: Widowed    Spouse name: Not on file  . Number of children: 1  . Years of education: Not on file  . Highest education level: Not on file  Occupational History  . Occupation: none  Social Needs  . Financial resource strain: Not on file  . Food insecurity:    Worry: Not on file    Inability: Not on file  . Transportation needs:    Medical: Not on file    Non-medical: Not on file  Tobacco Use  . Smoking status: Current Every Day Smoker    Packs/day: 1.00    Years: 24.00    Pack years: 24.00    Types: Cigarettes  . Smokeless tobacco: Never Used  Substance and Sexual Activity  . Alcohol use: Yes    Comment: seldom  . Drug use: No    Types: Marijuana    Comment: 25 years ago 11/22/13  . Sexual activity: Never  Lifestyle  . Physical activity:    Days per week: Not on file    Minutes per session: Not on file  . Stress: Not on file  Relationships  . Social connections:    Talks on phone: Not on file    Gets together: Not on file    Attends religious service: Not on file    Active member of club or organization: Not on file    Attends meetings of clubs or organizations: Not on file    Relationship status: Not on file  . Intimate partner violence:    Fear of current or ex partner: Not on file    Emotionally abused: Not on file    Physically abused: Not on file    Forced sexual activity: Not on file  Other Topics Concern  . Not on file  Social History Narrative  . Not on file    Family History  Problem Relation Age of Onset  . Aneurysm Mother   . Heart attack Father   . Stroke Father   . Breast cancer Paternal Grandmother     Past Surgical History:    Procedure Laterality Date  . CARDIAC CATHETERIZATION     4-28yrs ago  . CHOLECYSTECTOMY    . ENDOMETRIAL ABLATION     6 years ago  . IRRIGATION AND DEBRIDEMENT ABSCESS Right 03/17/2017  Procedure: IRRIGATION AND DEBRIDEMENT RIGHT THIGH ABSCESS;  Surgeon: Karie Soda, MD;  Location: [facility];  Service: General;  Laterality: Right;  . LAPAROSCOPIC APPENDECTOMY  10/04/2011   Procedure: APPENDECTOMY LAPAROSCOPIC;  Surgeon: Rulon Abide, DO;  Location: [facility];  Service: General;  Laterality: N/A;    ROS: Review of Systems Negative except as stated above PHYSICAL EXAM: BP 122/85   Pulse 75   Temp 98 F (36.7 C) (Oral)   Resp 16   Wt 199 lb 6.4 oz (90.4 kg)   SpO2 97%   BMI 29.45 kg/m   Wt Readings from Last 3 Encounters:  12/30/17 199 lb 6.4 oz (90.4 kg)  12/10/17 201 lb 6.4 oz (91.4 kg)  11/23/17 185 lb (83.9 kg)    Physical Exam  General appearance - alert, well appearing, middle-aged Caucasian female and in no distress Mental status - alert, oriented to person, place, and time, normal mood, behavior, speech, dress, motor activity, and thought processes Neck - supple, no significant adenopathy Chest - clear to auscultation, no wheezes, rales or rhonchi, symmetric air entry Heart - normal rate, regular rhythm, normal S1, S2, no murmurs, rubs, clicks or gallops Abdomen - soft, she is very sensitive to touch in the left upper quadrant of the abdomen.  No tenderness in the epigastric region or other areas.  No hepatosplenomegaly.  No rash seen. Breasts - breasts appear normal, no suspicious masses, no skin or nipple changes or axillary nodes Pelvic -CMA Pollock present for both breast and pelvic:  normal external genitalia, vulva, cervix, uterus and adnexa.  Moderate amount of creamy white discharge in the vagina and around the cervix Musculoskeletal -no active signs of tenosynovitis in the hands or wrists.  Mild joint involvement noted of the right wrist. Extremities -no lower  extremity edema. Skin: Small crusted fever blister noted below the lower lip on the right side Diabetic Foot Exam - Simple   Simple Foot Form Visual Inspection No deformities, no ulcerations, no other skin breakdown bilaterally:  Yes Sensation Testing Intact to touch and monofilament testing bilaterally:  Yes Pulse Check Posterior Tibialis and Dorsalis pulse intact bilaterally:  Yes Comments      BS 315 Lab Results  Component Value Date   HGBA1C >15 12/10/2017    ASSESSMENT AND PLAN: 1. Uncontrolled type 2 diabetes mellitus with peripheral neuropathy (HCC) Discussed the importance of healthy eating habits, regular aerobic exercise (at least 150 minutes a week as tolerated) and medication compliance to achieve or maintain control of diabetes. -Stop NovoLog.  Start Trulicity.  I went over how Trulicity work and possible side effects of nausea/vomiting.  She has no history of thyroid disease or pancreatitis. -Advised to check blood sugars at least twice a day and bring in log on next visit. See clinical pharmacist in about 2 weeks.  Patient to bring blood sugar readings with her. - POCT glucose (manual entry) - gabapentin (NEURONTIN) 300 MG capsule; Take 3 capsules (900 mg total) by mouth 2 (two) times daily.  Dispense: 180 capsule; Refill: 6 - Dulaglutide (TRULICITY) 0.75 MG/0.5ML SOPN; Inject into skin Subcut Q once a wk  Dispense: 4 pen; Refill: 5  2. Right upper quadrant abdominal pain It sounds like a neuropathic pain likely related to her diabetes.  I recommend increasing Neurontin from 600 mg twice a day to 900 mg twice a day - gabapentin (NEURONTIN) 300 MG capsule; Take 3 capsules (900 mg total) by mouth 2 (two) times daily.  Dispense: 180 capsule; Refill:  6  3. Polyarthritis -I have requested that patient sign a release for me to get her records from [facility].  I question whether she has rheumatoid arthritis versus OA - meloxicam (MOBIC) 7.5 MG tablet; Take  1 tablet (7.5 mg total) by mouth daily.  Dispense: 30 tablet; Refill: 6 - CYCLIC CITRUL PEPTIDE ANTIBODY, IGG/IGA - Sedimentation rate - Rheumatoid factor  4. Fever blister -Treatment at this time as it is resolving  5. Vaginal discharge - Cytology - PAP - fluconazole (DIFLUCAN) 150 MG tablet; Take 1 tablet (150 mg total) by mouth once for 1 dose.  Dispense: 1 tablet; Refill: 0  6. Breast cancer screening - MM Digital Screening; Future  Patient was given the opportunity to ask questions.  Patient verbalized understanding of the plan and was able to repeat key elements of the plan.   Orders Placed This Encounter  Procedures  . MM Digital Screening  . CYCLIC CITRUL PEPTIDE ANTIBODY, IGG/IGA  . Sedimentation rate  . Rheumatoid factor  . POCT glucose (manual entry)     Requested Prescriptions   Signed Prescriptions Disp Refills  . Tdap (BOOSTRIX) 5-2.5-18.5 LF-MCG/0.5 injection 0.5 mL 0    Sig: Inject 0.5 mLs into the muscle once for 1 dose.  . fluconazole (DIFLUCAN) 150 MG tablet 1 tablet 0    Sig: Take 1 tablet (150 mg total) by mouth once for 1 dose.  . meloxicam (MOBIC) 7.5 MG tablet 30 tablet 6    Sig: Take 1 tablet (7.5 mg total) by mouth daily.  Marland Kitchen gabapentin (NEURONTIN) 300 MG capsule 180 capsule 6    Sig: Take 3 capsules (900 mg total) by mouth 2 (two) times daily.  . Dulaglutide (TRULICITY) 0.75 MG/0.5ML SOPN 4 pen 5    Sig: Inject into skin Subcut Q once a wk    Return in about 6 weeks (around 02/10/2018).  Jonah Blue, MD, FACP

## 2017-12-30 NOTE — Patient Instructions (Addendum)
Give appointment with clinical pharmacist in 2 weeks for diabetes.    Stop Novolog. Star Trulicity.  Continue Levemir Check blood sugars 2 x a day and record readings.  Bring readings on next visit.  Increase Gabapentin to 900 mg twice a day.

## 2018-01-01 LAB — SEDIMENTATION RATE: Sed Rate: 81 mm/hr — ABNORMAL HIGH (ref 0–32)

## 2018-01-01 LAB — CYCLIC CITRUL PEPTIDE ANTIBODY, IGG/IGA: Cyclic Citrullin Peptide Ab: 6 units (ref 0–19)

## 2018-01-01 LAB — RHEUMATOID FACTOR: Rhuematoid fact SerPl-aCnc: 11.8 IU/mL (ref 0.0–13.9)

## 2018-01-03 LAB — CYTOLOGY - PAP
Bacterial vaginitis: NEGATIVE
Candida vaginitis: NEGATIVE
Chlamydia: NEGATIVE
HPV 16/18/45 genotyping: POSITIVE — AB
HPV: DETECTED — AB
Neisseria Gonorrhea: NEGATIVE
Trichomonas: NEGATIVE

## 2018-01-05 ENCOUNTER — Other Ambulatory Visit: Payer: Self-pay | Admitting: Internal Medicine

## 2018-01-05 DIAGNOSIS — R87613 High grade squamous intraepithelial lesion on cytologic smear of cervix (HGSIL): Secondary | ICD-10-CM

## 2018-01-06 ENCOUNTER — Encounter: Payer: Self-pay | Admitting: Advanced Practice Midwife

## 2018-01-06 ENCOUNTER — Encounter: Payer: Self-pay | Admitting: Internal Medicine

## 2018-01-12 NOTE — Progress Notes (Signed)
   S:     No chief complaint on file.  Patient arrives in good spirits.  Presents for diabetes management at the request of Dr. Laural Benes (PCP). Patient was referred on 12/30/17 and last seen by PCP on 12/30/17.   Patient reports Diabetes was diagnosed 14-15 years ago.  Family/Social History:  Father - MI, stroke Tobacco use: nicotine through the use of vape/E-cig  Patient reports adherence with medications. Of note, patient has concerns with GI side effects (diarrhea, gas/belching) and thyroid cancer risk from Trulicity.  Current diabetes medications include: Levemir 60 units daily, Trulicity 0.75mg  once weekly  Past diabetes medications tried: glipizide, metformin, Invokana  Patient denies hypoglycemic events.  Patient reported dietary habits: Reports snacking throughout the day and not eating meals.  Breakfast: skips breakfast most days. Sometimes eats a banana or biscuit. Lunch: salad, sandwich or soup Dinner: cheetos - favorite foods are pasta, potatoes  Snacks: throughout the day -- night snack of muffin Drinks: water, zero-calorie sweetened drinks  Patient reported exercise habits: Reports not exercising due to neuropathy   Patient denies nocturia.  Patient denies any new neuropathy. Patient denieas visual changes. Patient reports self foot exams.   O:  Physical Exam  ROS  Lab Results  Component Value Date   HGBA1C >15 12/10/2017   There were no vitals filed for this visit.  POCT Glucose: 363  Checks BG 1-2x/day. She brings in a log of sugars today. Of note, she ran out of test strips a couple of days ago so has been unable to test over the past several days.  Home fasting CBG: 62 - 318 2 hour post-prandial/random CBG: 273 - 509.  A/P: Diabetes longstanding diagnosed currently uncontrolled based on A1c and home glucose levels. Patient denies hypoglycemic events and is able to verbalize appropriate hypoglycemia management plan. Patient reports adherence with  medication. Control is suboptimal due to dietary indiscretion and medication intolerance.   With patient's in-clinic glucose level, will have Marylene Land see her. The plan is still to continue Trulicity and increase Levemir to 70 units. Pt has been instructed to administer this as 35 units BID.   Of note, I addressed GI side effects with Trulicity. Upon questioning, patient states that these are improving and have not occurred over the past several days. Discussed boxed warning with thyroid C-cell tumors and GLP-1 RA's. Emphasized the rarity of this incidence. Additionally, she has no history of thyroid disease or family history of thyroid cancer.   Written patient instructions provided.  Total time in face to face counseling 30 minutes.   Follow up with PCP on 02/17/18.   Patient seen with:  Carolin Sicks, PharmD Candidate [employer]   Cloyde Reams, PharmD Clinical Pharmacist  [facility]  [phone]

## 2018-01-13 ENCOUNTER — Other Ambulatory Visit: Payer: Self-pay | Admitting: Pharmacist

## 2018-01-13 ENCOUNTER — Encounter: Payer: Self-pay | Admitting: Pharmacist

## 2018-01-13 ENCOUNTER — Ambulatory Visit: Payer: Self-pay | Attending: Internal Medicine | Admitting: Physician Assistant

## 2018-01-13 VITALS — BP 108/78 | HR 81 | Temp 98.1°F | Resp 16 | Ht 69.0 in | Wt 202.8 lb

## 2018-01-13 DIAGNOSIS — Z882 Allergy status to sulfonamides status: Secondary | ICD-10-CM | POA: Insufficient documentation

## 2018-01-13 DIAGNOSIS — E1165 Type 2 diabetes mellitus with hyperglycemia: Secondary | ICD-10-CM | POA: Insufficient documentation

## 2018-01-13 DIAGNOSIS — Z885 Allergy status to narcotic agent status: Secondary | ICD-10-CM | POA: Insufficient documentation

## 2018-01-13 DIAGNOSIS — IMO0002 Reserved for concepts with insufficient information to code with codable children: Secondary | ICD-10-CM

## 2018-01-13 DIAGNOSIS — Z87442 Personal history of urinary calculi: Secondary | ICD-10-CM | POA: Insufficient documentation

## 2018-01-13 DIAGNOSIS — Z794 Long term (current) use of insulin: Secondary | ICD-10-CM | POA: Insufficient documentation

## 2018-01-13 DIAGNOSIS — I10 Essential (primary) hypertension: Secondary | ICD-10-CM | POA: Insufficient documentation

## 2018-01-13 DIAGNOSIS — Z791 Long term (current) use of non-steroidal anti-inflammatories (NSAID): Secondary | ICD-10-CM | POA: Insufficient documentation

## 2018-01-13 DIAGNOSIS — N3001 Acute cystitis with hematuria: Secondary | ICD-10-CM | POA: Insufficient documentation

## 2018-01-13 DIAGNOSIS — E1142 Type 2 diabetes mellitus with diabetic polyneuropathy: Secondary | ICD-10-CM | POA: Insufficient documentation

## 2018-01-13 DIAGNOSIS — Z79899 Other long term (current) drug therapy: Secondary | ICD-10-CM | POA: Insufficient documentation

## 2018-01-13 DIAGNOSIS — F329 Major depressive disorder, single episode, unspecified: Secondary | ICD-10-CM | POA: Insufficient documentation

## 2018-01-13 LAB — POCT URINALYSIS DIPSTICK
Bilirubin, UA: NEGATIVE
Glucose, UA: 500
Ketones, UA: NEGATIVE
Nitrite, UA: POSITIVE
Spec Grav, UA: 1.015 (ref 1.010–1.025)
Urobilinogen, UA: 0.2 E.U./dL
pH, UA: 5.5 (ref 5.0–8.0)

## 2018-01-13 LAB — GLUCOSE, POCT (MANUAL RESULT ENTRY)
POC Glucose: 363 mg/dl — AB (ref 70–99)
POC Glucose: 325 mg/dl — AB (ref 70–99)

## 2018-01-13 MED ORDER — INSULIN DETEMIR 100 UNIT/ML FLEXPEN: PEN_INJECTOR | SUBCUTANEOUS | 11 refills | Status: DC

## 2018-01-13 MED ORDER — NITROFURANTOIN MONOHYD MACRO 100 MG PO CAPS: 100.0000 mg | ORAL_CAPSULE | Freq: Two times a day (BID) | ORAL | 0 refills | Status: DC

## 2018-01-13 MED ORDER — GLUCOSE BLOOD VI STRP: ORAL_STRIP | 12 refills | Status: DC

## 2018-01-13 MED ORDER — TRUEPLUS SAFETY LANCETS 28G MISC: 3 refills | Status: DC

## 2018-01-13 MED ORDER — INSULIN ASPART 100 UNIT/ML ~~LOC~~ SOLN
20.0000 [IU] | Freq: Once | SUBCUTANEOUS | Status: AC
  Administered 2018-01-13: 20 [IU] via SUBCUTANEOUS

## 2018-01-13 MED ORDER — FLUCONAZOLE 150 MG PO TABS: 150.0000 mg | ORAL_TABLET | Freq: Once | ORAL | 0 refills | Status: AC

## 2018-01-13 MED ORDER — TRUE METRIX METER W/DEVICE KIT: PACK | 0 refills | Status: DC

## 2018-01-13 MED FILL — TRUEplus LANCETS 28G MISC: 30 days supply | Qty: 100 | Fill #0

## 2018-01-13 MED FILL — TRUE METRIX TEST STRIP: 30 days supply | Qty: 100 | Fill #0

## 2018-01-13 MED FILL — FLUoxetine HCL 40 MG CAPS: 40 | 30 days supply | Qty: 30 | Fill #1

## 2018-01-13 MED FILL — $LEVEMIR FLEXTOUCH 100 UNIT: 100 | 21 days supply | Qty: 15 | Fill #0

## 2018-01-13 MED FILL — NITROFURANTOIN MONO-MCR 100: 100 | 5 days supply | Qty: 10 | Fill #0

## 2018-01-13 MED FILL — FLUCONAZOLE 150 MG TABS: 150 | 1 days supply | Qty: 1 | Fill #0

## 2018-01-13 MED FILL — !TRUE METRIX BLOOD GLUCOSE: 365 days supply | Qty: 1 | Fill #0

## 2018-01-13 NOTE — Progress Notes (Signed)
[name], is a 47 y.o. female  BZJ:[id number]  OFB:[id number]  DOB - [birth year]  Subjective:  Chief Complaint and HPI: [name] is a 47 y.o. female here today  And was being seen by our Raynelle Bring and blood sugar was >360, so she was seen by me.  Diabetes meds adjusted by Lurena Joiner.   Also having dysuria and frequency for about 5 days.  No flank pain or fever.  No vaginal discharge.    Poor compliance with diabetic diet.  Admits to eating cheetos for dinner most days.     ROS:   Constitutional:  No f/c, No night sweats, No unexplained weight loss. EENT:  No vision changes, No blurry vision, No hearing changes. No mouth, throat, or ear problems.  Respiratory: No cough, No SOB Cardiac: No CP, no palpitations GI:  No abd pain, No N/V/D. GU: + Urinary s/sx Musculoskeletal: No joint pain Neuro: No headache, no dizziness, no motor weakness.  Skin: No rash Endocrine:  No polydipsia. No polyuria.  Psych: Denies SI/HI  No problems updated.  ALLERGIES: Allergies  Allergen Reactions  . Codeine Itching  . Sulfa Antibiotics Hives    PAST MEDICAL HISTORY: Past Medical History:  Diagnosis Date  . Arthritis    fingers  . Depression   . Diabetes mellitus   . Dyspnea 11/22/2013  . Hypertension   . Kidney stones   . PONV (postoperative nausea and vomiting)   . Sleep apnea    lost 70lbs no cpap x35yr now    MEDICATIONS AT HOME: Prior to Admission medications   Medication Sig Start Date End Date Taking? Authorizing Provider  Blood Glucose Monitoring Suppl (TRUE METRIX METER) w/Device KIT Use as directed 01/13/18  Yes JLadell Pier MD  Dulaglutide (TRULICITY) 07.82MUM/3.5TISOPN Inject into skin Subcut Q once a wk 12/30/17  Yes JLadell Pier MD  famotidine (PEPCID) 20 MG tablet Take 1 tablet (20 mg total) 2 (two) times daily by mouth. 07/07/17  Yes Robinson, JMartiniqueN, PA-C  FLUoxetine (PROZAC) 40 MG capsule Take 1 capsule (40 mg total) by mouth daily. 12/10/17  Yes  NEna Dawley Tiffany S, PA-C  gabapentin (NEURONTIN) 300 MG capsule Take 3 capsules (900 mg total) by mouth 2 (two) times daily. 12/30/17  Yes JLadell Pier MD  glucose blood (TRUE METRIX BLOOD GLUCOSE TEST) test strip Use as instructed 01/13/18  Yes JLadell Pier MD  Insulin Detemir (LEVEMIR FLEXTOUCH) 100 UNIT/ML Pen Inject 35 units into the skin 2 times daily. 01/13/18  Yes JLadell Pier MD  lisinopril (PRINIVIL,ZESTRIL) 20 MG tablet Take 1 tablet (20 mg total) by mouth daily. 12/23/17  Yes NEna Dawley Tiffany S, PA-C  meloxicam (MOBIC) 7.5 MG tablet Take 1 tablet (7.5 mg total) by mouth daily. 12/30/17  Yes JLadell Pier MD  Multiple Vitamin (MULTIVITAMIN WITH MINERALS) TABS tablet Take 1 tablet by mouth daily.   Yes [provider]  mupirocin ointment (BACTROBAN) 2 % Place 1 application into the nose 2 (two) times daily. 12/10/17  Yes NBrayton Caves PA-C  TRUEPLUS SAFETY LANCETS 28G MISC Use as directed 01/13/18  Yes JLadell Pier MD  Barberry-Oreg Grape-Goldenseal (BERBERINE COMPLEX PO) Take 1 tablet by mouth daily.    [provider]  esomeprazole (NEXIUM) 20 MG capsule Take 20 mg by mouth daily.     [provider]  fluconazole (DIFLUCAN) 150 MG tablet Take 1 tablet (150 mg total) by mouth once for 1 dose. If needed  for yeast infection 01/13/18 01/13/18  Argentina Donovan, PA-C  hydrOXYzine (ATARAX/VISTARIL) 10 MG tablet Take 10 mg by mouth at bedtime as needed for sleep. 06/02/17   [provider]  hydrOXYzine (ATARAX/VISTARIL) 10 MG tablet Take 1 tablet (10 mg total) by mouth 3 (three) times daily as needed. Patient not taking: Reported on 01/13/2018 12/23/17   Brayton Caves, PA-C  magnesium oxide (MAG-OX) 400 MG tablet Take 400 mg by mouth daily.    [provider]  nitrofurantoin, macrocrystal-monohydrate, (MACROBID) 100 MG capsule Take 1 capsule (100 mg total) by mouth 2 (two) times daily. 01/13/18   Argentina Donovan, PA-C      Objective:  EXAM:   Vitals:   01/13/18 1614  BP: 108/78  Pulse: 81  Resp: 16  Temp: 98.1 F (36.7 C)  TempSrc: Oral  SpO2: 97%  Weight: 202 lb 12.8 oz (92 kg)  Height: 5' 9" (1.753 m)    General appearance : A&OX3. NAD. Non-toxic-appearing HEENT: Atraumatic and Normocephalic.  PERRLA. EOM intact.   Neck: supple, no JVD. No cervical lymphadenopathy. No thyromegaly Chest/Lungs:  Breathing-non-labored, Good air entry bilaterally, breath sounds normal without rales, rhonchi, or wheezing  CVS: S1 S2 regular, no murmurs, gallops, rubs  Abdomen: Bowel sounds present, Non tender and not distended with no gaurding, rigidity or rebound.  No CVATTP Extremities: Bilateral Lower Ext shows no edema, both legs are warm to touch with = pulse throughout Neurology:  CN II-XII grossly intact, Non focal.   Psych:  TP linear. J/I WNL. Normal speech. Appropriate eye contact and affect.  Skin:  No Rash  Data Review Lab Results  Component Value Date   HGBA1C >15 12/10/2017   HGBA1C 15.0 (H) 03/15/2017   HGBA1C 9.2 (H) 11/07/2012     Assessment & Plan   1. Acute cystitis with hematuria Push water - Urine Culture - nitrofurantoin, macrocrystal-monohydrate, (MACROBID) 100 MG capsule; Take 1 capsule (100 mg total) by mouth 2 (two) times daily.  Dispense: 10 capsule; Refill: 0 - fluconazole (DIFLUCAN) 150 MG tablet; Take 1 tablet (150 mg total) by mouth once for 1 dose. If needed for yeast infection  Dispense: 1 tablet; Refill: 0  2. Uncontrolled type 2 diabetes mellitus with peripheral neuropathy ([place]) levemir was increased and ordered in divided doses of 35 units bid.  Continue trulicity.   - Glucose (CBG) - insulin aspart (novoLOG) injection 20 Units-blood sugar from 363 to 325 - Urinalysis Dipstick Diabetic diet is imperative and compliance with medications.       Patient have been counseled extensively about nutrition and exercise  Return for keep f/up appt with Pharm D and Dr  Wynetta Emery.  The patient was given clear instructions to go to ER or return to medical center if symptoms don't improve, worsen or new problems develop. The patient verbalized understanding. The patient was told to call to get lab results if they haven't heard anything in the next week.     Freeman Caldron, PA-C [facility] [place], [place]   01/13/2018, 4:33 PMPatient ID: [name], female   DOB: 1971-07-18, 47 y.o.   MRN: [id number]

## 2018-01-13 NOTE — Addendum Note (Signed)
Addended byVidal Schwalbe on: 01/13/2018 04:57 PM   Modules accepted: Orders

## 2018-01-13 NOTE — Patient Instructions (Addendum)
Drink lots of water.  Limit your carbohydrate intake.  Check your sugars and take all meds as directed.     Urinary Tract Infection, Adult A urinary tract infection (UTI) is an infection of any part of the urinary tract. The urinary tract includes the:  Kidneys.  Ureters.  Bladder.  Urethra.  These organs make, store, and get rid of pee (urine) in the body. Follow these instructions at home:  Take over-the-counter and prescription medicines only as told by your doctor.  If you were prescribed an antibiotic medicine, take it as told by your doctor. Do not stop taking the antibiotic even if you start to feel better.  Avoid the following drinks: ? Alcohol. ? Caffeine. ? Tea. ? Carbonated drinks.  Drink enough fluid to keep your pee clear or pale yellow.  Keep all follow-up visits as told by your doctor. This is important.  Make sure to: ? Empty your bladder often and completely. Do not to hold pee for long periods of time. ? Empty your bladder before and after sex. ? Wipe from front to back after a bowel movement if you are female. Use each tissue one time when you wipe. Contact a doctor if:  You have back pain.  You have a fever.  You feel sick to your stomach (nauseous).  You throw up (vomit).  Your symptoms do not get better after 3 days.  Your symptoms go away and then come back. Get help right away if:  You have very bad back pain.  You have very bad lower belly (abdominal) pain.  You are throwing up and cannot keep down any medicines or water. This information is not intended to replace advice given to you by your health care provider. Make sure you discuss any questions you have with your health care provider. Document Released: 02/03/2008 Document Revised: 01/23/2016 Document Reviewed: 07/08/2015 Elsevier Interactive Patient Education  [employer].

## 2018-01-14 MED FILL — $BOOSTRIX VACCINE SYRINGE: 5-2.5-18.5 | 1 days supply | Qty: 1 | Fill #0

## 2018-01-16 LAB — URINE CULTURE

## 2018-01-17 ENCOUNTER — Telehealth: Payer: Self-pay | Admitting: *Deleted

## 2018-01-17 ENCOUNTER — Telehealth: Payer: Self-pay

## 2018-01-17 ENCOUNTER — Other Ambulatory Visit: Payer: Self-pay | Admitting: Physician Assistant

## 2018-01-17 MED ORDER — DOXYCYCLINE HYCLATE 100 MG PO TABS: 100.0000 mg | ORAL_TABLET | Freq: Two times a day (BID) | ORAL | 0 refills | Status: DC

## 2018-01-17 NOTE — Telephone Encounter (Signed)
CMA attempt to call patient to inform on lab results.  No answer and left a VM for patient to call back.  If patient call back, please inform:  Please call patient.  Her urine results show that she may require additional antibiotics to clear the infection.  I sent her another prescription to start ASAP.  He can stop the other antibiotic if she has any left and start the new one.  Follow-up if any problems.  Work on diabetic diet.  Drink water. Thanks, Georgian Co, PA-C

## 2018-01-17 NOTE — Telephone Encounter (Signed)
Patient verified DOB Patient is aware of urine showing a need for additional antibiotics (doxycycline) being sent to [facility] for completion. Patient advised to adhere to diabetic diet and increase water intake. No further questions at this time.

## 2018-01-17 NOTE — Telephone Encounter (Signed)
-----   Message from Anders Simmonds, [place] sent at 01/17/2018  7:14 AM EDT ----- Please call patient.  Her urine results show that she may require additional antibiotics to clear the infection.  I sent her another prescription to start ASAP.  He can stop the other antibiotic if she has any left and start the new one.  Follow-up if any problems.  Work on diabetic diet.  Drink water. Thanks, Georgian Co, PA-C

## 2018-01-26 ENCOUNTER — Telehealth: Payer: Self-pay | Admitting: Internal Medicine

## 2018-01-26 NOTE — Telephone Encounter (Signed)
Provided pt with [facility] clinic phone number to call

## 2018-01-26 NOTE — Telephone Encounter (Signed)
Patient called and stated that her gynecology appointment was cancelled yesterday, she received a message that it was cancelled, she wanted to know if it could be rescheduled. Registrar looked at note and it stated "Error (needs to be on an MD schedule)"

## 2018-01-27 ENCOUNTER — Ambulatory Visit: Payer: Self-pay | Attending: Family Medicine | Admitting: Pharmacist

## 2018-01-27 ENCOUNTER — Encounter: Payer: Self-pay | Admitting: Pharmacist

## 2018-01-27 DIAGNOSIS — IMO0002 Reserved for concepts with insufficient information to code with codable children: Secondary | ICD-10-CM

## 2018-01-27 DIAGNOSIS — E1142 Type 2 diabetes mellitus with diabetic polyneuropathy: Secondary | ICD-10-CM

## 2018-01-27 DIAGNOSIS — E1165 Type 2 diabetes mellitus with hyperglycemia: Secondary | ICD-10-CM | POA: Insufficient documentation

## 2018-01-27 LAB — GLUCOSE, POCT (MANUAL RESULT ENTRY): POC Glucose: 353 mg/dl — AB (ref 70–99)

## 2018-01-27 MED ORDER — INSULIN DETEMIR 100 UNIT/ML FLEXPEN: PEN_INJECTOR | SUBCUTANEOUS | 3 refills | Status: DC

## 2018-01-27 MED FILL — !TRULICITY 0.75 MG/0.5 ML P: 0.75 | 28 days supply | Qty: 1 | Fill #1

## 2018-01-27 NOTE — Progress Notes (Signed)
S:     No chief complaint on file.  Patient arrives in good spirits.  Presents for diabetes management at the request of Dr. Laural Benes (PCP). Patient was referred on 12/30/17 and last seen by PCP on that day.   1. DM Patient reports Diabetes was diagnosed 14-15 years ago. On previous visit, we increased levemir from 60 units daily to 70 units daily in two divided doses. Pt states that she has adjusted to this. Of note, pt reports traveling over the weekend and forgetting her levemir doses over the weekend.     Pertinent family/social History:  Father - MI, stroke Tobacco use: nicotine through the use of vape/E-cig  Patient reports adherence with medications. Current diabetes medications include: Levemir 35 units BID, Trulicity 0.75mg  once weekly  Past diabetes medications tried: glipizide, metformin, Invokana  Patient reports 1 hypoglycemic event since last seen. Pt reported feeling shaky. She treated appropriately with a glass of apple juice and on recheck her BG went back up to 120.   Patient reported dietary habits:  Pt reports eating 3 meals a day. She states she is eating more wholesome meals including fruits, vegetables, lean protein, and whole grains.   Patient reported exercise habits: Patient reports walking for 30 minutes/day every other day   Patient denies nocturia.  Patient denies any new neuropathy. Patient denieas visual changes. Patient reports self foot exams.   O:  Physical Exam  ROS  Lab Results  Component Value Date   HGBA1C >15 12/10/2017   There were no vitals filed for this visit.  POCT Glucose: 353  Checks BG 1-2x/day. She brings in a log of sugars today. Results as follows:                   FPG      Random/Pre-prandial         5/18          201                  5/19          ---               361 5/20          220             233 5/21          65                 --- 5/22          389               --- 5/23          269                --- 5/24          172               --- 5/29          223               --- 5/30          230               ----  A/P: Diabetes longstanding diagnosed currently uncontrolled based on A1c and home glucose levels. Patient denies hypoglycemic events and is able to verbalize appropriate hypoglycemia management plan. Patient reports adherence with medication. Control is suboptimal due to dietary indiscretion.   -  POCT glucose 353 in clinic. Pt declined in clinic insulin. Discussed this level and patient's home levels with Dr. Laural Benes. -We have room to increase Trulicity, but this is difficult to get through patient assistance in the pharmacy here at [facility].  Additionally, pt's GI side effects are beginning to subside with the 0.75mg  dose. Will continue with 0.75mg  for now. - After discussion with Dr. Laural Benes, agreed to increase Levemir from 35 units BID to 40 units BID.   Written patient instructions provided.  Total time in face to face counseling 30 minutes.   Follow up with PCP on 02/17/18.   Patient seen with:  Carolin Sicks, PharmD Candidate [employer]   Cloyde Reams, PharmD Clinical Pharmacist  [facility]  [phone]

## 2018-01-27 NOTE — Patient Instructions (Addendum)
Thank you for coming to see me today. Please remember/do the following:   1. Increase your levemir from 35 to 40 units 2 times daily.   2. Continue taking Trulicity. I sent the refill request in to the pharmacy here. Remember to pick this up on your way out.  3. Continue checking home sugar levels.   4. You have a doctor's appointment with Dr. Laural Benes on 02/17/18. I will follow-up with you as needed after that visit.

## 2018-02-08 MED FILL — FLUoxetine HCL 40 MG CAPS: 40 | 30 days supply | Qty: 30 | Fill #2

## 2018-02-08 MED FILL — GABAPENTIN 300 MG CAPSULE: 300 | 30 days supply | Qty: 180 | Fill #1

## 2018-02-09 ENCOUNTER — Ambulatory Visit: Payer: Self-pay | Attending: Nurse Practitioner | Admitting: Nurse Practitioner

## 2018-02-09 ENCOUNTER — Encounter: Payer: Self-pay | Admitting: Nurse Practitioner

## 2018-02-09 VITALS — BP 114/79 | HR 90 | Temp 99.7°F | Ht 69.0 in | Wt 208.2 lb

## 2018-02-09 DIAGNOSIS — N390 Urinary tract infection, site not specified: Secondary | ICD-10-CM | POA: Insufficient documentation

## 2018-02-09 DIAGNOSIS — F329 Major depressive disorder, single episode, unspecified: Secondary | ICD-10-CM | POA: Insufficient documentation

## 2018-02-09 DIAGNOSIS — Z87442 Personal history of urinary calculi: Secondary | ICD-10-CM | POA: Insufficient documentation

## 2018-02-09 DIAGNOSIS — Z882 Allergy status to sulfonamides status: Secondary | ICD-10-CM | POA: Insufficient documentation

## 2018-02-09 DIAGNOSIS — M549 Dorsalgia, unspecified: Secondary | ICD-10-CM | POA: Insufficient documentation

## 2018-02-09 DIAGNOSIS — G4730 Sleep apnea, unspecified: Secondary | ICD-10-CM | POA: Insufficient documentation

## 2018-02-09 DIAGNOSIS — I10 Essential (primary) hypertension: Secondary | ICD-10-CM | POA: Insufficient documentation

## 2018-02-09 DIAGNOSIS — E1165 Type 2 diabetes mellitus with hyperglycemia: Secondary | ICD-10-CM | POA: Insufficient documentation

## 2018-02-09 DIAGNOSIS — Z791 Long term (current) use of non-steroidal anti-inflammatories (NSAID): Secondary | ICD-10-CM | POA: Insufficient documentation

## 2018-02-09 DIAGNOSIS — E11649 Type 2 diabetes mellitus with hypoglycemia without coma: Secondary | ICD-10-CM | POA: Insufficient documentation

## 2018-02-09 DIAGNOSIS — Z823 Family history of stroke: Secondary | ICD-10-CM | POA: Insufficient documentation

## 2018-02-09 DIAGNOSIS — M130 Polyarthritis, unspecified: Secondary | ICD-10-CM | POA: Insufficient documentation

## 2018-02-09 DIAGNOSIS — Z885 Allergy status to narcotic agent status: Secondary | ICD-10-CM | POA: Insufficient documentation

## 2018-02-09 DIAGNOSIS — Z8249 Family history of ischemic heart disease and other diseases of the circulatory system: Secondary | ICD-10-CM | POA: Insufficient documentation

## 2018-02-09 DIAGNOSIS — Z794 Long term (current) use of insulin: Secondary | ICD-10-CM | POA: Insufficient documentation

## 2018-02-09 DIAGNOSIS — E1142 Type 2 diabetes mellitus with diabetic polyneuropathy: Secondary | ICD-10-CM | POA: Insufficient documentation

## 2018-02-09 DIAGNOSIS — Z803 Family history of malignant neoplasm of breast: Secondary | ICD-10-CM | POA: Insufficient documentation

## 2018-02-09 DIAGNOSIS — IMO0002 Reserved for concepts with insufficient information to code with codable children: Secondary | ICD-10-CM

## 2018-02-09 DIAGNOSIS — R21 Rash and other nonspecific skin eruption: Secondary | ICD-10-CM | POA: Insufficient documentation

## 2018-02-09 DIAGNOSIS — Z9049 Acquired absence of other specified parts of digestive tract: Secondary | ICD-10-CM | POA: Insufficient documentation

## 2018-02-09 DIAGNOSIS — Z79899 Other long term (current) drug therapy: Secondary | ICD-10-CM | POA: Insufficient documentation

## 2018-02-09 DIAGNOSIS — Z9889 Other specified postprocedural states: Secondary | ICD-10-CM | POA: Insufficient documentation

## 2018-02-09 DIAGNOSIS — R399 Unspecified symptoms and signs involving the genitourinary system: Secondary | ICD-10-CM

## 2018-02-09 LAB — POCT URINALYSIS DIPSTICK
Bilirubin, UA: NEGATIVE
Glucose, UA: NEGATIVE
Ketones, UA: NEGATIVE
Nitrite, UA: NEGATIVE
Protein, UA: POSITIVE — AB
Spec Grav, UA: 1.01 (ref 1.010–1.025)
Urobilinogen, UA: 1 E.U./dL
pH, UA: 6 (ref 5.0–8.0)

## 2018-02-09 LAB — GLUCOSE, POCT (MANUAL RESULT ENTRY): POC Glucose: 136 mg/dl — AB (ref 70–99)

## 2018-02-09 MED ORDER — KETOCONAZOLE 2 % EX CREA: 1.0000 "application " | TOPICAL_CREAM | Freq: Every day | CUTANEOUS | 0 refills | Status: DC

## 2018-02-09 MED ORDER — DICLOFENAC SODIUM 75 MG PO TBEC: 75.0000 mg | DELAYED_RELEASE_TABLET | Freq: Two times a day (BID) | ORAL | 1 refills | Status: DC

## 2018-02-09 MED ORDER — PHENAZOPYRIDINE HCL 200 MG PO TABS: 200.0000 mg | ORAL_TABLET | Freq: Three times a day (TID) | ORAL | 0 refills | Status: DC | PRN

## 2018-02-09 MED FILL — KETOCONAZOLE 2% CREAM: 2 | 15 days supply | Qty: 15 | Fill #0

## 2018-02-09 MED FILL — DICLOFENAC SOD EC 75 MG TAB: 75 | 15 days supply | Qty: 30 | Fill #0

## 2018-02-09 NOTE — Patient Instructions (Signed)

## 2018-02-09 NOTE — Progress Notes (Signed)
Assessment & Plan:  [name] was seen today for urinary tract infection.  Diagnoses and all orders for this visit:  UTI symptoms -     Urinalysis Dipstick -     CULTURE, URINE COMPREHENSIVE -     phenazopyridine (PYRIDIUM) 200 MG tablet; Take 1 tablet (200 mg total) by mouth 3 (three) times daily as needed for pain.  Polyarthritis -     diclofenac (VOLTAREN) 75 MG EC tablet; Take 1 tablet (75 mg total) by mouth 2 (two) times daily.  Uncontrolled type 2 diabetes mellitus with peripheral neuropathy (HCC) -     Glucose (CBG) Continue blood sugar control as discussed in office today, low carbohydrate diet, and regular physical exercise as tolerated, 150 minutes per week (30 min each day, 5 days per week, or 50 min 3 days per week). Keep blood sugar logs with fasting goal of 80-130 mg/dl, post prandial less than 180.  For Hypoglycemia: BS <60 and Hyperglycemia BS >400; contact the clinic ASAP. Annual eye exams and foot exams are recommended.  Skin rash -     ketoconazole (NIZORAL) 2 % cream; Apply 1 application topically daily. If unimproved at next office visit; may need DERM referral   Patient has been counseled on age-appropriate routine health concerns for screening and prevention. These are reviewed and up-to-date. Referrals have been placed accordingly. Immunizations are up-to-date or declined.    Subjective:   Chief Complaint  Patient presents with  . Urinary Tract Infection    Pt. stated burns when she urinates and back pain.    HPI [name] 47 y.o. female presents to office today with complaints of UTI symptoms. She was recently treated for a UTI 01-13-2018 with nitrofurantion initially and then doxycycline after her urine culture revealed Klebs. Today she reports her symptoms of dysuria restarted 1 week after she finished the doxycycline.   Urinary Tract Infection Patient complains of dysuria She has had symptoms for a few weeks. Patient also complains of back pain.  Patient denies fever. Patient does not have a history of recurrent UTI.  Patient does not have a history of pyelonephritis.   Rash Patient complains of rash involving the left occipital scalp area. Rash started 3 weeks ago. Appearance of rash at onset: Color of lesion(s): red, Texture of lesion(s): flat. Rash has not changed over time. Discomfort associated with rash: is pruritic.  Associated symptoms: none. Denies: fever and headache. Patient has not had previous evaluation of rash. Patient has tried to treat the rash with bactroban with no relief. Patient has not had contacts with similar rash. Patient has not identified precipitant. Patient has not had new exposures (soaps, lotions, laundry detergents, foods, medications, plants, insects or animals.)   Review of Systems  Constitutional: Negative for fever, malaise/fatigue and weight loss.  HENT: Negative.  Negative for nosebleeds.   Eyes: Negative.  Negative for blurred vision, double vision and photophobia.  Respiratory: Negative.  Negative for cough and shortness of breath.   Cardiovascular: Negative.  Negative for chest pain, palpitations and leg swelling.  Gastrointestinal: Negative.  Negative for heartburn, nausea and vomiting.  Musculoskeletal: Positive for joint pain (generalized). Negative for myalgias.  Skin: Positive for rash.  Neurological: Negative.  Negative for dizziness, focal weakness, seizures and headaches.  Psychiatric/Behavioral: Negative.  Negative for suicidal ideas.    Past Medical History:  Diagnosis Date  . Arthritis    fingers  . Depression   . Diabetes mellitus   . Dyspnea 11/22/2013  .  Hypertension   . Kidney stones   . PONV (postoperative nausea and vomiting)   . Sleep apnea    lost 70lbs no cpap x5yr now    Past Surgical History:  Procedure Laterality Date  . CARDIAC CATHETERIZATION     4-562yrago  . CHOLECYSTECTOMY    . ENDOMETRIAL ABLATION     6 years ago  . IRRIGATION AND DEBRIDEMENT ABSCESS  Right 03/17/2017   Procedure: IRRIGATION AND DEBRIDEMENT RIGHT THIGH ABSCESS;  Surgeon: GrMichael BostonMD;  Location: [facility];  Service: General;  Laterality: Right;  . LAPAROSCOPIC APPENDECTOMY  10/04/2011   Procedure: APPENDECTOMY LAPAROSCOPIC;  Surgeon: BrJudieth KeensDO;  Location: [facility];  Service: General;  Laterality: N/A;    Family History  Problem Relation Age of Onset  . Aneurysm Mother   . Heart attack Father   . Stroke Father   . Breast cancer Paternal Grandmother     Social History Reviewed with no changes to be made today.   Outpatient Medications Prior to Visit  Medication Sig Dispense Refill  . Blood Glucose Monitoring Suppl (TRUE METRIX METER) w/Device KIT Use as directed 1 kit 0  . Dulaglutide (TRULICITY) 0.2.63GZC/5.8IFOPN Inject into skin Subcut Q once a wk 4 pen 5  . esomeprazole (NEXIUM) 20 MG capsule Take 20 mg by mouth daily.     . Marland KitchenLUoxetine (PROZAC) 40 MG capsule Take 1 capsule (40 mg total) by mouth daily. 30 capsule 11  . gabapentin (NEURONTIN) 300 MG capsule Take 3 capsules (900 mg total) by mouth 2 (two) times daily. 180 capsule 6  . glucose blood (TRUE METRIX BLOOD GLUCOSE TEST) test strip Use as instructed 100 each 12  . hydrOXYzine (ATARAX/VISTARIL) 10 MG tablet Take 10 mg by mouth at bedtime as needed for sleep.  1  . Insulin Detemir (LEVEMIR FLEXTOUCH) 100 UNIT/ML Pen Inject 40 units into the skin twice daily. 15 mL 3  . TRUEPLUS SAFETY LANCETS 28G MISC Use as directed 100 each 3  . meloxicam (MOBIC) 7.5 MG tablet Take 1 tablet (7.5 mg total) by mouth daily. 30 tablet 6  . mupirocin ointment (BACTROBAN) 2 % Place 1 application into the nose 2 (two) times daily. 22 g 0  . Barberry-Oreg Grape-Goldenseal (BERBERINE COMPLEX PO) Take 1 tablet by mouth daily.    . famotidine (PEPCID) 20 MG tablet Take 1 tablet (20 mg total) 2 (two) times daily by mouth. (Patient not taking: Reported on 02/09/2018) 30 tablet 0  . hydrOXYzine (ATARAX/VISTARIL) 10 MG tablet  Take 1 tablet (10 mg total) by mouth 3 (three) times daily as needed. (Patient not taking: Reported on 02/09/2018) 30 tablet 2  . lisinopril (PRINIVIL,ZESTRIL) 20 MG tablet Take 1 tablet (20 mg total) by mouth daily. (Patient not taking: Reported on 02/09/2018) 30 tablet 3  . magnesium oxide (MAG-OX) 400 MG tablet Take 400 mg by mouth daily.    . Multiple Vitamin (MULTIVITAMIN WITH MINERALS) TABS tablet Take 1 tablet by mouth daily.    . nitrofurantoin, macrocrystal-monohydrate, (MACROBID) 100 MG capsule Take 1 capsule (100 mg total) by mouth 2 (two) times daily. 10 capsule 0  . doxycycline (VIBRA-TABS) 100 MG tablet Take 1 tablet (100 mg total) by mouth 2 (two) times daily. 10 tablet 0   No facility-administered medications prior to visit.     Allergies  Allergen Reactions  . Codeine Itching  . Sulfa Antibiotics Hives       Objective:    BP 114/79 (BP Location:  Left Arm, Patient Position: Sitting, Cuff Size: Normal)   Pulse 90   Temp 99.7 F (37.6 C) (Oral)   Ht 5' 9"  (1.753 m)   Wt 208 lb 3.2 oz (94.4 kg)   SpO2 98%   BMI 30.75 kg/m  Wt Readings from Last 3 Encounters:  02/09/18 208 lb 3.2 oz (94.4 kg)  01/13/18 202 lb 12.8 oz (92 kg)  12/30/17 199 lb 6.4 oz (90.4 kg)    Physical Exam  Constitutional: She is oriented to person, place, and time. She appears well-developed and well-nourished. She is cooperative.  HENT:  Head: Normocephalic and atraumatic.    Eyes: EOM are normal.  Neck: Normal range of motion.  Cardiovascular: Normal rate, regular rhythm, normal heart sounds and intact distal pulses. Exam reveals no gallop and no friction rub.  No murmur heard. Pulmonary/Chest: Effort normal and breath sounds normal. No tachypnea. No respiratory distress. She has no decreased breath sounds. She has no wheezes. She has no rhonchi. She has no rales. She exhibits no tenderness.  Abdominal: Bowel sounds are normal.  Musculoskeletal: Normal range of motion. She exhibits no  edema.  Neurological: She is alert and oriented to person, place, and time. Coordination normal.  Skin: Skin is warm and dry. Rash noted. Rash is macular.  Psychiatric: She has a normal mood and affect. Her behavior is normal. Judgment and thought content normal.  Nursing note and vitals reviewed.      Patient has been counseled extensively about nutrition and exercise as well as the importance of adherence with medications and regular follow-up. The patient was given clear instructions to go to ER or return to medical center if symptoms don't improve, worsen or new problems develop. The patient verbalized understanding.   Follow-up: Return if symptoms worsen or fail to improve.   Gildardo Pounds, FNP-BC [facility] [place], [place]   02/09/2018, 3:06 PM

## 2018-02-12 LAB — CULTURE, URINE COMPREHENSIVE

## 2018-02-14 ENCOUNTER — Other Ambulatory Visit: Payer: Self-pay | Admitting: Nurse Practitioner

## 2018-02-14 MED ORDER — CIPROFLOXACIN HCL 500 MG PO TABS: 500.0000 mg | ORAL_TABLET | Freq: Two times a day (BID) | ORAL | 0 refills | Status: DC

## 2018-02-15 ENCOUNTER — Emergency Department (HOSPITAL_COMMUNITY)
Admission: EM | Admit: 2018-02-15 | Discharge: 2018-02-15 | Disposition: A | Discharge status: Home / Self Care | Payer: Self-pay | Attending: Emergency Medicine | Admitting: Emergency Medicine

## 2018-02-15 ENCOUNTER — Emergency Department (HOSPITAL_COMMUNITY): Payer: Self-pay

## 2018-02-15 ENCOUNTER — Other Ambulatory Visit: Payer: Self-pay

## 2018-02-15 ENCOUNTER — Encounter (HOSPITAL_COMMUNITY): Payer: Self-pay | Admitting: *Deleted

## 2018-02-15 DIAGNOSIS — N12 Tubulo-interstitial nephritis, not specified as acute or chronic: Secondary | ICD-10-CM

## 2018-02-15 DIAGNOSIS — I10 Essential (primary) hypertension: Secondary | ICD-10-CM | POA: Insufficient documentation

## 2018-02-15 DIAGNOSIS — N10 Acute tubulo-interstitial nephritis: Secondary | ICD-10-CM | POA: Insufficient documentation

## 2018-02-15 DIAGNOSIS — R072 Precordial pain: Secondary | ICD-10-CM | POA: Insufficient documentation

## 2018-02-15 DIAGNOSIS — R791 Abnormal coagulation profile: Secondary | ICD-10-CM | POA: Insufficient documentation

## 2018-02-15 DIAGNOSIS — R1084 Generalized abdominal pain: Secondary | ICD-10-CM | POA: Insufficient documentation

## 2018-02-15 DIAGNOSIS — Z79899 Other long term (current) drug therapy: Secondary | ICD-10-CM | POA: Insufficient documentation

## 2018-02-15 DIAGNOSIS — R7989 Other specified abnormal findings of blood chemistry: Secondary | ICD-10-CM

## 2018-02-15 DIAGNOSIS — F17210 Nicotine dependence, cigarettes, uncomplicated: Secondary | ICD-10-CM | POA: Insufficient documentation

## 2018-02-15 DIAGNOSIS — E119 Type 2 diabetes mellitus without complications: Secondary | ICD-10-CM | POA: Insufficient documentation

## 2018-02-15 DIAGNOSIS — Z794 Long term (current) use of insulin: Secondary | ICD-10-CM | POA: Insufficient documentation

## 2018-02-15 LAB — BASIC METABOLIC PANEL
Anion gap: 10 (ref 5–15)
BUN: 24 mg/dL — ABNORMAL HIGH (ref 6–20)
CO2: 26 mmol/L (ref 22–32)
Calcium: 9 mg/dL (ref 8.9–10.3)
Chloride: 99 mmol/L — ABNORMAL LOW (ref 101–111)
Creatinine, Ser: 1.2 mg/dL — ABNORMAL HIGH (ref 0.44–1.00)
GFR calc Af Amer: >60 mL/min (ref >60)
GFR calc non Af Amer: 53 mL/min — ABNORMAL LOW (ref >60)
Glucose, Bld: 284 mg/dL — ABNORMAL HIGH (ref 65–99)
Potassium: 4.1 mmol/L (ref 3.5–5.1)
Sodium: 135 mmol/L (ref 135–145)

## 2018-02-15 LAB — URINALYSIS, ROUTINE W REFLEX MICROSCOPIC
Bilirubin Urine: NEGATIVE
Glucose, UA: NEGATIVE mg/dL
Ketones, ur: NEGATIVE mg/dL
Nitrite: NEGATIVE
Protein, ur: 30 mg/dL — AB
RBC / HPF: >50 RBC/hpf — ABNORMAL HIGH (ref 0–5)
Specific Gravity, Urine: 1.014 (ref 1.005–1.030)
WBC, UA: >50 WBC/hpf — ABNORMAL HIGH (ref 0–5)
pH: 5 (ref 5.0–8.0)

## 2018-02-15 LAB — HEPATIC FUNCTION PANEL
ALT: 50 U/L (ref 14–54)
AST: 33 U/L (ref 15–41)
Albumin: 2.6 g/dL — ABNORMAL LOW (ref 3.5–5.0)
Alkaline Phosphatase: 470 U/L — ABNORMAL HIGH (ref 38–126)
Bilirubin, Direct: 0.8 mg/dL — ABNORMAL HIGH (ref 0.1–0.5)
Indirect Bilirubin: 0.6 mg/dL (ref 0.3–0.9)
Total Bilirubin: 1.4 mg/dL — ABNORMAL HIGH (ref 0.3–1.2)
Total Protein: 6.9 g/dL (ref 6.5–8.1)

## 2018-02-15 LAB — CBC
HCT: 35.4 % — ABNORMAL LOW (ref 36.0–46.0)
Hemoglobin: 11.8 g/dL — ABNORMAL LOW (ref 12.0–15.0)
MCH: 28.8 pg (ref 26.0–34.0)
MCHC: 33.3 g/dL (ref 30.0–36.0)
MCV: 86.3 fL (ref 78.0–100.0)
Platelets: 287 10*3/uL (ref 150–400)
RBC: 4.1 MIL/uL (ref 3.87–5.11)
RDW: 12.9 % (ref 11.5–15.5)
WBC: 10.7 10*3/uL — ABNORMAL HIGH (ref 4.0–10.5)

## 2018-02-15 LAB — POC URINE PREG, ED: Preg Test, Ur: NEGATIVE

## 2018-02-15 LAB — I-STAT TROPONIN, ED
Troponin i, poc: 0 ng/mL (ref 0.00–0.08)
Troponin i, poc: 0 ng/mL (ref 0.00–0.08)

## 2018-02-15 LAB — I-STAT BETA HCG BLOOD, ED (MC, WL, AP ONLY): I-stat hCG, quantitative: 12.5 m[IU]/mL — ABNORMAL HIGH (ref <5)

## 2018-02-15 LAB — D-DIMER, QUANTITATIVE: D-Dimer, Quant: 1.16 ug/mL-FEU — ABNORMAL HIGH (ref 0.00–0.50)

## 2018-02-15 LAB — LIPASE, BLOOD: Lipase: 17 U/L (ref 11–51)

## 2018-02-15 MED ORDER — ONDANSETRON 4 MG PO TBDP: 4.0000 mg | ORAL_TABLET | Freq: Three times a day (TID) | ORAL | 0 refills | Status: DC | PRN

## 2018-02-15 MED ORDER — ONDANSETRON HCL 4 MG/2ML IJ SOLN
4.0000 mg | Freq: Once | INTRAMUSCULAR | Status: AC | PRN
  Administered 2018-02-15: 4 mg via INTRAVENOUS
  Filled 2018-02-15: qty 2

## 2018-02-15 MED ORDER — CEPHALEXIN 500 MG PO CAPS: 500.0000 mg | ORAL_CAPSULE | Freq: Four times a day (QID) | ORAL | 0 refills | Status: DC

## 2018-02-15 MED ORDER — MORPHINE SULFATE (PF) 4 MG/ML IV SOLN
4.0000 mg | Freq: Once | INTRAVENOUS | Status: AC
  Administered 2018-02-15: 4 mg via INTRAVENOUS
  Filled 2018-02-15: qty 1

## 2018-02-15 MED ORDER — ONDANSETRON HCL 4 MG/2ML IJ SOLN
4.0000 mg | Freq: Once | INTRAMUSCULAR | Status: AC
  Administered 2018-02-15: 4 mg via INTRAVENOUS
  Filled 2018-02-15: qty 2

## 2018-02-15 MED ORDER — SODIUM CHLORIDE 0.9 % IV BOLUS
1000.0000 mL | Freq: Once | INTRAVENOUS | Status: AC
  Administered 2018-02-15: 1000 mL via INTRAVENOUS

## 2018-02-15 MED ORDER — IOPAMIDOL (ISOVUE-370) INJECTION 76%
INTRAVENOUS | Status: AC
  Filled 2018-02-15: qty 100

## 2018-02-15 MED ORDER — HYDROCODONE-ACETAMINOPHEN 5-325 MG PO TABS: 1.0000 | ORAL_TABLET | Freq: Four times a day (QID) | ORAL | 0 refills | Status: DC | PRN

## 2018-02-15 MED ORDER — SODIUM CHLORIDE 0.9 % IV SOLN
1.0000 g | Freq: Once | INTRAVENOUS | Status: AC
  Administered 2018-02-15: 1 g via INTRAVENOUS
  Filled 2018-02-15: qty 10

## 2018-02-15 MED ORDER — FENTANYL CITRATE (PF) 100 MCG/2ML IJ SOLN
50.0000 ug | Freq: Once | INTRAMUSCULAR | Status: AC
  Administered 2018-02-15: 50 ug via INTRAVENOUS
  Filled 2018-02-15: qty 2

## 2018-02-15 MED ORDER — IOPAMIDOL (ISOVUE-370) INJECTION 76%
100.0000 mL | Freq: Once | INTRAVENOUS | Status: AC | PRN
  Administered 2018-02-15: 80 mL via INTRAVENOUS

## 2018-02-15 NOTE — ED Notes (Signed)
Pt is alert and oriented x 4 and is verbally responsive, Pt reports 4/10 pinching left sided chest pain that radiated to left arm/shoulder. Pt reports associated abdominal pain and nausea.

## 2018-02-15 NOTE — ED Provider Notes (Signed)
[name] is a 47 y.o. female, presenting to the ED with abdominal pain and fevers.  Also endorses left-sided chest and left upper back pain. She has follow up appointment with her PCP on Thursday, June 20.  HPI from Will Maize, PA-C: "[name] is a 47 y.o. Female who presents to the ED complaining of fevers, not feeling well, urinary symptoms and generalized abdominal pain for the past week.  She reports yesterday evening she started having some left-sided chest and left lateral rib pain.  She reports her left-sided chest pain and rib pain is constant.  She reports some intermittent SOB. She has more pain with small breaths.  She denies history of MI.  She is a former smoker.  She formally had problems with hypertension.  No history of hyperlipidemia.  She is a diabetic and reports she is been compliant with her medications.  She reports she has had some intermittent dysuria and hematuria for the past week and was seen by PCP last week.  She had her urine checked, but no antibiotics were called in.  She reports she saw on MyChart that her urine culture looked infected. No treatments prior to arrival. Father with family history of MI. She denies urinary urgency, urinary frequency, vaginal bleeding, vaginal discharge, diarrhea, shortness of breath, coughing, hemoptysis, leg pain, leg swelling, recent long travel, or exogenous estrogen use. No recent surgeries.   The history is provided by the patient and medical records. No language interpreter was used.  Chest Pain   Associated symptoms include abdominal pain, nausea and vomiting (resolved. ). Pertinent negatives include no back pain, no cough, no fever, no headaches, no palpitations and no shortness of breath.  Shortness of Breath  Associated symptoms include chest pain, vomiting (resolved. ) and abdominal pain. Pertinent negatives include no fever, no headaches, no sore throat, no neck pain, no cough, no wheezing, no rash and no leg  swelling."  Physical Exam  BP 118/82   Pulse 73   Temp 98 F (36.7 C) (Oral)   Resp 19   Ht 5\' 9"  (1.753 m)   Wt 94.3 kg (208 lb)   SpO2 97%   BMI 30.72 kg/m   Physical Exam  Constitutional: She appears well-developed and well-nourished. No distress.  HENT:  Head: Normocephalic and atraumatic.  Eyes: Conjunctivae are normal.  Neck: Neck supple.  Cardiovascular: Normal rate, regular rhythm, normal heart sounds and intact distal pulses.  Pulmonary/Chest: Effort normal and breath sounds normal. No respiratory distress.  No increased work of breathing.  Speaks in full sentences without difficulty.  Abdominal: Soft. There is no tenderness. There is no guarding.  Musculoskeletal: She exhibits tenderness. She exhibits no edema.       Back:  Lymphadenopathy:    She has no cervical adenopathy.  Neurological: She is alert.  Skin: Skin is warm and dry. She is not diaphoretic.  Psychiatric: She has a normal mood and affect. Her behavior is normal.  Nursing note and vitals reviewed.   ED Course/Procedures   Clinical Course as of Feb 16 1624  Tue Feb 15, 2018  1518 Introduced myself to patient and performed exam. States she was previously pain-free, now 6/10.    [SJ]    Clinical Course User Index [SJ] Feb 17, 2018, PA-C    Procedures  Results for orders placed or performed during the hospital encounter of 02/15/18  Basic metabolic panel  Result Value Ref Range   Sodium 135 135 - 145 mmol/L  Potassium 4.1 3.5 - 5.1 mmol/L   Chloride 99 (L) 101 - 111 mmol/L   CO2 26 22 - 32 mmol/L   Glucose, Bld 284 (H) 65 - 99 mg/dL   BUN 24 (H) 6 - 20 mg/dL   Creatinine, Ser 3.89 (H) 0.44 - 1.00 mg/dL   Calcium 9.0 8.9 - 37.3 mg/dL   GFR calc non Af Amer 53 (L) >60 mL/min   GFR calc Af Amer >60 >60 mL/min   Anion gap 10 5 - 15  CBC  Result Value Ref Range   WBC 10.7 (H) 4.0 - 10.5 K/uL   RBC 4.10 3.87 - 5.11 MIL/uL   Hemoglobin 11.8 (L) 12.0 - 15.0 g/dL   HCT 42.8 (L) 76.8 - 11.5  %   MCV 86.3 78.0 - 100.0 fL   MCH 28.8 26.0 - 34.0 pg   MCHC 33.3 30.0 - 36.0 g/dL   RDW 72.6 20.3 - 55.9 %   Platelets 287 150 - 400 K/uL  Urinalysis, Routine w reflex microscopic  Result Value Ref Range   Color, Urine AMBER (A) YELLOW   APPearance CLOUDY (A) CLEAR   Specific Gravity, Urine 1.014 1.005 - 1.030   pH 5.0 5.0 - 8.0   Glucose, UA NEGATIVE NEGATIVE mg/dL   Hgb urine dipstick LARGE (A) NEGATIVE   Bilirubin Urine NEGATIVE NEGATIVE   Ketones, ur NEGATIVE NEGATIVE mg/dL   Protein, ur 30 (A) NEGATIVE mg/dL   Nitrite NEGATIVE NEGATIVE   Leukocytes, UA MODERATE (A) NEGATIVE   RBC / HPF >50 (H) 0 - 5 RBC/hpf   WBC, UA >50 (H) 0 - 5 WBC/hpf   Bacteria, UA MANY (A) NONE SEEN   Squamous Epithelial / LPF 6-10 0 - 5   WBC Clumps PRESENT    Mucus PRESENT    Hyaline Casts, UA PRESENT   Hepatic function panel  Result Value Ref Range   Total Protein 6.9 6.5 - 8.1 g/dL   Albumin 2.6 (L) 3.5 - 5.0 g/dL   AST 33 15 - 41 U/L   ALT 50 14 - 54 U/L   Alkaline Phosphatase 470 (H) 38 - 126 U/L   Total Bilirubin 1.4 (H) 0.3 - 1.2 mg/dL   Bilirubin, Direct 0.8 (H) 0.1 - 0.5 mg/dL   Indirect Bilirubin 0.6 0.3 - 0.9 mg/dL  Lipase, blood  Result Value Ref Range   Lipase 17 11 - 51 U/L  D-dimer, quantitative  Result Value Ref Range   D-Dimer, Quant 1.16 (H) 0.00 - 0.50 ug/mL-FEU  I-stat troponin, ED  Result Value Ref Range   Troponin i, poc 0.00 0.00 - 0.08 ng/mL   Comment 3          I-Stat beta hCG blood, ED  Result Value Ref Range   I-stat hCG, quantitative 12.5 (H) <5 mIU/mL   Comment 3          POC urine preg, ED  Result Value Ref Range   Preg Test, Ur NEGATIVE NEGATIVE  I-stat troponin, ED  Result Value Ref Range   Troponin i, poc 0.00 0.00 - 0.08 ng/mL   Comment 3           Dg Chest 2 View  Result Date: 02/15/2018 CLINICAL DATA:  Shortness of breath. EXAM: CHEST - 2 VIEW COMPARISON:  07/07/2017. FINDINGS: Mediastinum hilar structures normal. Lungs are clear. No pleural  effusion or pneumothorax. Heart size normal. No bony abnormality. IMPRESSION: IMPRESSION No acute cardiopulmonary disease. Electronically Signed   By: Maisie Fus  Register   On: 02/15/2018 10:21   Ct Angio Chest Pe W/cm &/or Wo Cm  Result Date: 02/15/2018 CLINICAL DATA:  Chest pain, shortness of breath. EXAM: CT ANGIOGRAPHY CHEST WITH CONTRAST TECHNIQUE: Multidetector CT imaging of the chest was performed using the standard protocol during bolus administration of intravenous contrast. Multiplanar CT image reconstructions and MIPs were obtained to evaluate the vascular anatomy. CONTRAST:  73mL ISOVUE-370 IOPAMIDOL (ISOVUE-370) INJECTION 76% COMPARISON:  None. FINDINGS: Cardiovascular: Satisfactory opacification of the pulmonary arteries to the segmental level. No evidence of pulmonary embolism. Normal heart size. No pericardial effusion. Mediastinum/Nodes: No enlarged mediastinal, hilar, or axillary lymph nodes. Thyroid gland, trachea, and esophagus demonstrate no significant findings. Lungs/Pleura: Lungs are clear. No pleural effusion or pneumothorax. Upper Abdomen: No acute abnormality. Musculoskeletal: No chest wall abnormality. No acute or significant osseous findings. Review of the MIP images confirms the above findings. IMPRESSION: No definite evidence of pulmonary embolus. No acute cardiopulmonary abnormality seen. Electronically Signed   By: Lupita Raider, M.D.   On: 02/15/2018 15:30   Ct Renal Stone Study  Result Date: 02/15/2018 CLINICAL DATA:  BILATERAL flank pain for 1 week with hematuria, stone disease suspected, LEFT flank pain; history hypertension, type II diabetes mellitus, kidney stones, smoker EXAM: CT ABDOMEN AND PELVIS WITHOUT CONTRAST TECHNIQUE: Multidetector CT imaging of the abdomen and pelvis was performed following the standard protocol without IV contrast. Sagittal and coronal MPR images reconstructed from axial data set. Oral contrast was not administered. COMPARISON:  07/07/2017  FINDINGS: Lower chest: Minimal dependent bibasilar atelectasis. Hepatobiliary: Post cholecystectomy.  Liver unremarkable. Pancreas: Normal appearance Spleen: Normal appearance Adrenals/Urinary Tract: Adrenal glands normal appearance. Minimal BILATERAL perinephric stranding, nonspecific. No renal mass, hydronephrosis or hydroureter. No urinary tract calcification or dilatation. Bladder decompressed. Stomach/Bowel: Question prior appendectomy. Stomach and bowel loops normal appearance for technique. Vascular/Lymphatic: Atherosclerotic calcifications aorta without aneurysm. Scattered normal sized para-aortic and mesenteric nodes. Few pelvic phleboliths. Reproductive: Unremarkable uterus and adnexa Other: No free air. Tiny amount of nonspecific free pelvic fluid. No hernia or acute inflammatory process. Musculoskeletal: No acute osseous findings. IMPRESSION: No significant intra-abdominal or intrapelvic abnormalities. Aortic Atherosclerosis (ICD10-I70.0). Electronically Signed   By: Ulyses Southward M.D.   On: 02/15/2018 12:48    EKG Interpretation  Date/Time:  Tuesday February 15 2018 09:37:27 EDT Ventricular Rate:  88 PR Interval:    QRS Duration: 86 QT Interval:  347 QTC Calculation: 420 R Axis:   85 Text Interpretation:  Sinus rhythm No acute changes mild ST elevation in the inferior leads Confirmed by Derwood Kaplan [phone]) on 02/15/2018 12:17:41 PM       EKG Interpretation  Date/Time:  Tuesday February 15 2018 15:29:29 EDT Ventricular Rate:  76 PR Interval:    QRS Duration: 88 QT Interval:  364 QTC Calculation: 410 R Axis:   85 Text Interpretation:  Sinus rhythm Confirmed by Kristine Royal [phone]) on 02/15/2018 3:32:19 PM       MDM   Took patient care handoff report from Will Dansie, PA-C. Plan: CT PE study pending.  Discharge if negative.  Patient with suspected pyelonephritis.  Urine culture obtained June 12 positive for Klebsiella. Patient is nontoxic appearing, afebrile, not tachycardic,  not tachypneic, not hypotensive, maintains excellent SPO2 on room air, and is in no apparent distress.  CT PE study negative for acute abnormality.  Patient has close follow-up with her PCP already scheduled. The patient was given instructions for home care as well as return precautions. Patient voices understanding of these  instructions, accepts the plan, and is comfortable with discharge.   Vitals:   02/15/18 0939 02/15/18 1208 02/15/18 1517  BP: 108/69 118/82 140/85  Pulse: 87 73 77  Resp: (!) 21 19 (!) 25  Temp: (!) 97.5 F (36.4 C) 98 F (36.7 C)   TempSrc: Oral Oral   SpO2: 100% 97% 99%  Weight:  94.3 kg (208 lb)   Height:  5\' 9"  (1.753 m)        Concepcion Living 02/15/18 1638    Wynetta Fines, MD 02/15/18 2310

## 2018-02-15 NOTE — ED Provider Notes (Signed)
[facility] DEPT Provider Note   CSN: [id number] Arrival date & time: 02/15/18  0930     History   Chief Complaint Chief Complaint  Patient presents with  . Chest Pain  . Shortness of Breath    HPI [name] is a 47 y.o. female.  [name] is a 47 y.o. Female who presents to the ED complaining of fevers, not feeling well, urinary symptoms and generalized abdominal pain for the past week.  She reports yesterday evening she started having some left-sided chest and left lateral rib pain.  She reports her left-sided chest pain and rib pain is constant.  She reports some intermittent SOB. She has more pain with small breaths.  She denies history of MI.  She is a former smoker.  She formally had problems with hypertension.  No history of hyperlipidemia.  She is a diabetic and reports she is been compliant with her medications.  She reports she has had some intermittent dysuria and hematuria for the past week and was seen by PCP last week.  She had her urine checked, but no antibiotics were called in.  She reports she saw on MyChart that her urine culture looked infected. No treatments prior to arrival. Father with family history of MI. She denies urinary urgency, urinary frequency, vaginal bleeding, vaginal discharge, diarrhea, shortness of breath, coughing, hemoptysis, leg pain, leg swelling, recent long travel, or exogenous estrogen use. No recent surgeries.   The history is provided by the patient and medical records. No language interpreter was used.  Chest Pain   Associated symptoms include abdominal pain, nausea and vomiting (resolved. ). Pertinent negatives include no back pain, no cough, no fever, no headaches, no palpitations and no shortness of breath.  Shortness of Breath  Associated symptoms include chest pain, vomiting (resolved. ) and abdominal pain. Pertinent negatives include no fever, no headaches, no sore throat, no neck pain, no  cough, no wheezing, no rash and no leg swelling.    Past Medical History:  Diagnosis Date  . Arthritis    fingers  . Depression   . Diabetes mellitus   . Dyspnea 11/22/2013  . Hypertension   . Kidney stones   . PONV (postoperative nausea and vomiting)   . Sleep apnea    lost 70lbs no cpap x36yr now    Patient Active Problem List   Diagnosis Date Noted  . Right upper quadrant abdominal pain 12/30/2017  . Polyarthritis 12/30/2017  . Necrotizing fasciitis right thigh s/p I&D 03/17/2017 03/18/2017  . OSA (obstructive sleep apnea) 11/22/2013  . Dyspnea 11/22/2013  . Uncontrolled type 2 diabetes mellitus with hyperosmolar [place]ketotic hyperglycemia [place] 07/21/2012  . HTN (hypertension) 07/21/2012  . Smoker 07/21/2012    Past Surgical History:  Procedure Laterality Date  . CARDIAC CATHETERIZATION     4-513yrago  . CHOLECYSTECTOMY    . ENDOMETRIAL ABLATION     6 years ago  . IRRIGATION AND DEBRIDEMENT ABSCESS Right 03/17/2017   Procedure: IRRIGATION AND DEBRIDEMENT RIGHT THIGH ABSCESS;  Surgeon: GrMichael BostonMD;  Location: [facility];  Service: General;  Laterality: Right;  . LAPAROSCOPIC APPENDECTOMY  10/04/2011   Procedure: APPENDECTOMY LAPAROSCOPIC;  Surgeon: BrJudieth KeensDO;  Location: [facility];  Service: General;  Laterality: N/A;     OB History   None      Home Medications    Prior to Admission medications   Medication Sig Start Date End Date Taking? Authorizing Provider  diclofenac (VOLTAREN)  75 MG EC tablet Take 1 tablet (75 mg total) by mouth 2 (two) times daily. 02/09/18  Yes Gildardo Pounds, NP  Dulaglutide (TRULICITY) 0.16 WF/0.9NA SOPN Inject into skin Subcut Q once a wk 12/30/17  Yes Ladell Pier, MD  esomeprazole (NEXIUM) 20 MG capsule Take 20 mg by mouth daily.    Yes [provider]  FLUoxetine (PROZAC) 40 MG capsule Take 1 capsule (40 mg total) by mouth daily. 12/10/17  Yes Ena Dawley, Tiffany S, PA-C  gabapentin (NEURONTIN) 300 MG capsule Take 3  capsules (900 mg total) by mouth 2 (two) times daily. 12/30/17  Yes Ladell Pier, MD  Insulin Detemir (LEVEMIR FLEXTOUCH) 100 UNIT/ML Pen Inject 40 units into the skin twice daily. 01/27/18  Yes Ladell Pier, MD  ciprofloxacin (CIPRO) 500 MG tablet Take 1 tablet (500 mg total) by mouth 2 (two) times daily for 5 days. Patient not taking: Reported on 02/15/2018 02/14/18 02/19/18  Gildardo Pounds, NP  famotidine (PEPCID) 20 MG tablet Take 1 tablet (20 mg total) 2 (two) times daily by mouth. Patient not taking: Reported on 02/09/2018 07/07/17   Robinson, [place] N, PA-C  glucose blood (TRUE METRIX BLOOD GLUCOSE TEST) test strip Use as instructed 01/13/18   Ladell Pier, MD  hydrOXYzine (ATARAX/VISTARIL) 10 MG tablet Take 1 tablet (10 mg total) by mouth 3 (three) times daily as needed. Patient not taking: Reported on 02/09/2018 12/23/17   Brayton Caves, PA-C  ketoconazole (NIZORAL) 2 % cream Apply 1 application topically daily. Patient not taking: Reported on 02/15/2018 02/09/18   Gildardo Pounds, NP  lisinopril (PRINIVIL,ZESTRIL) 20 MG tablet Take 1 tablet (20 mg total) by mouth daily. Patient not taking: Reported on 02/09/2018 12/23/17   Brayton Caves, PA-C  nitrofurantoin, macrocrystal-monohydrate, (MACROBID) 100 MG capsule Take 1 capsule (100 mg total) by mouth 2 (two) times daily. Patient not taking: Reported on 02/15/2018 01/13/18   Argentina Donovan, PA-C  phenazopyridine (PYRIDIUM) 200 MG tablet Take 1 tablet (200 mg total) by mouth 3 (three) times daily as needed for pain. Patient not taking: Reported on 02/15/2018 02/09/18   Gildardo Pounds, NP  TRUEPLUS SAFETY LANCETS 28G MISC Use as directed 01/13/18   Ladell Pier, MD    Family History Family History  Problem Relation Age of Onset  . Aneurysm Mother   . Heart attack Father   . Stroke Father   . Breast cancer Paternal Grandmother     Social History Social History   Tobacco Use  . Smoking status: Current Every Day  Smoker    Packs/day: 1.00    Years: 24.00    Pack years: 24.00    Types: Cigarettes  . Smokeless tobacco: Never Used  Substance Use Topics  . Alcohol use: Yes    Comment: seldom  . Drug use: No    Types: Marijuana    Comment: 25 years ago 11/22/13     Allergies   Codeine and Sulfa antibiotics   Review of Systems Review of Systems  Constitutional: Negative for chills and fever.  HENT: Negative for congestion and sore throat.   Eyes: Negative for visual disturbance.  Respiratory: Negative for cough, shortness of breath and wheezing.   Cardiovascular: Positive for chest pain. Negative for palpitations and leg swelling.  Gastrointestinal: Positive for abdominal pain, nausea and vomiting (resolved. ). Negative for blood in stool and diarrhea.  Genitourinary: Positive for dysuria, flank pain and hematuria. Negative for decreased urine volume, difficulty urinating,  pelvic pain, urgency, vaginal bleeding and vaginal discharge.  Musculoskeletal: Negative for back pain and neck pain.  Skin: Negative for rash and wound.  Neurological: Negative for headaches.     Physical Exam Updated Vital Signs BP 140/85   Pulse 77   Temp 98 F (36.7 C) (Oral)   Resp (!) 25   Ht _0  (1.753 m)   Wt 94.3 kg (208 lb)   SpO2 99%   BMI 30.72 kg/m   Physical Exam  Constitutional: She appears well-developed and well-nourished. No distress.  Nontoxic-appearing.  Overweight female.  HENT:  Head: Normocephalic and atraumatic.  Mouth/Throat: Oropharynx is clear and moist.  Eyes: Pupils are equal, round, and reactive to light. Conjunctivae are normal. Right eye exhibits no discharge. Left eye exhibits no discharge.  Neck: Normal range of motion. Neck supple. No JVD present.  Cardiovascular: Normal rate, regular rhythm, normal heart sounds and intact distal pulses. Exam reveals no gallop and no friction rub.  No murmur heard. Bilateral radial, posterior tibialis and dorsalis pedis pulses are  intact.    Pulmonary/Chest: Effort normal and breath sounds normal. No stridor. No respiratory distress. She has no wheezes. She has no rales. She exhibits tenderness.  Lungs are clear to ascultation bilaterally. Symmetric chest expansion bilaterally. No increased work of breathing. No rales or rhonchi.  No overlying skin changes to her chest.  Mild left lateral chest wall tenderness to palpation which reproduces her pain.  Abdominal: Soft. Bowel sounds are normal. She exhibits no distension and no mass. There is tenderness. There is no rebound and no guarding.  Abdomen is soft.  Bowel sounds are present.  Patient has mild generalized abdominal tenderness to palpation that seems to worsen her left side.  Mild left lateral rib tenderness to palpation.  No CVA or flank tenderness.  Musculoskeletal: Normal range of motion. She exhibits no edema or tenderness.  No calf edema or tenderness.  Lymphadenopathy:    She has no cervical adenopathy.  Neurological: She is alert. She exhibits normal muscle tone. Coordination normal.  Skin: Skin is warm and dry. Capillary refill takes less than 2 seconds. No rash noted. She is not diaphoretic. No erythema. No pallor.  Psychiatric: She has a normal mood and affect. Her behavior is normal.  Nursing note and vitals reviewed.    ED Treatments / Results  Labs (all labs ordered are listed, but only abnormal results are displayed) Labs Reviewed  BASIC METABOLIC PANEL - Abnormal; Notable for the following components:      Result Value   Chloride 99 (*)    Glucose, Bld 284 (*)    BUN 24 (*)    Creatinine, Ser 1.20 (*)    GFR calc non Af Amer 53 (*)    All other components within normal limits  CBC - Abnormal; Notable for the following components:   WBC 10.7 (*)    Hemoglobin 11.8 (*)    HCT 35.4 (*)    All other components within normal limits  URINALYSIS, ROUTINE W REFLEX MICROSCOPIC - Abnormal; Notable for the following components:   Color, Urine AMBER  (*)    APPearance CLOUDY (*)    Hgb urine dipstick LARGE (*)    Protein, ur 30 (*)    Leukocytes, UA MODERATE (*)    RBC / HPF >50 (*)    WBC, UA >50 (*)    Bacteria, UA MANY (*)    All other components within normal limits  HEPATIC FUNCTION PANEL - Abnormal;  Notable for the following components:   Albumin 2.6 (*)    Alkaline Phosphatase 470 (*)    Total Bilirubin 1.4 (*)    Bilirubin, Direct 0.8 (*)    All other components within normal limits  D-DIMER, QUANTITATIVE (NOT AT [facility]) - Abnormal; Notable for the following components:   D-Dimer, Quant 1.16 (*)    All other components within normal limits  I-STAT BETA HCG BLOOD, ED ([facility], [facility], [facility] ONLY) - Abnormal; Notable for the following components:   I-stat hCG, quantitative 12.5 (*)    All other components within normal limits  LIPASE, BLOOD  I-STAT TROPONIN, ED  POC URINE PREG, ED  I-STAT TROPONIN, ED    EKG EKG Interpretation  Date/Time:  Tuesday February 15 2018 09:37:27 EDT Ventricular Rate:  88 PR Interval:    QRS Duration: 86 QT Interval:  347 QTC Calculation: 420 R Axis:   85 Text Interpretation:  Sinus rhythm No acute changes mild ST elevation in the inferior leads Confirmed by Varney Biles ([id number]) on 02/15/2018 12:17:41 PM   Radiology Dg Chest 2 View  Result Date: 02/15/2018 CLINICAL DATA:  Shortness of breath. EXAM: CHEST - 2 VIEW COMPARISON:  07/07/2017. FINDINGS: Mediastinum hilar structures normal. Lungs are clear. No pleural effusion or pneumothorax. Heart size normal. No bony abnormality. IMPRESSION: IMPRESSION No acute cardiopulmonary disease. Electronically Signed   By: Marcello Moores  Register   On: 02/15/2018 10:21   Ct Renal Stone Study  Result Date: 02/15/2018 CLINICAL DATA:  BILATERAL flank pain for 1 week with hematuria, stone disease suspected, LEFT flank pain; history hypertension, type II diabetes mellitus, kidney stones, smoker EXAM: CT ABDOMEN AND PELVIS WITHOUT CONTRAST TECHNIQUE: Multidetector CT imaging  of the abdomen and pelvis was performed following the standard protocol without IV contrast. Sagittal and coronal MPR images reconstructed from axial data set. Oral contrast was not administered. COMPARISON:  07/07/2017 FINDINGS: Lower chest: Minimal dependent bibasilar atelectasis. Hepatobiliary: Post cholecystectomy.  Liver unremarkable. Pancreas: Normal appearance Spleen: Normal appearance Adrenals/Urinary Tract: Adrenal glands normal appearance. Minimal BILATERAL perinephric stranding, nonspecific. No renal mass, hydronephrosis or hydroureter. No urinary tract calcification or dilatation. Bladder decompressed. Stomach/Bowel: Question prior appendectomy. Stomach and bowel loops normal appearance for technique. Vascular/Lymphatic: Atherosclerotic calcifications aorta without aneurysm. Scattered normal sized para-aortic and mesenteric nodes. Few pelvic phleboliths. Reproductive: Unremarkable uterus and adnexa Other: No free air. Tiny amount of nonspecific free pelvic fluid. No hernia or acute inflammatory process. Musculoskeletal: No acute osseous findings. IMPRESSION: No significant intra-abdominal or intrapelvic abnormalities. Aortic Atherosclerosis (ICD10-I70.0). Electronically Signed   By: Lavonia Dana M.D.   On: 02/15/2018 12:48    Procedures Procedures (including critical care time)  Medications Ordered in ED Medications  iopamidol (ISOVUE-370) 76 % injection (has no administration in time range)  morphine 4 MG/ML injection 4 mg (has no administration in time range)  ondansetron (ZOFRAN) injection 4 mg (has no administration in time range)  sodium chloride 0.9 % bolus 1,000 mL (0 mLs Intravenous Stopped 02/15/18 1407)  ondansetron (ZOFRAN) injection 4 mg (4 mg Intravenous Given 02/15/18 1220)  fentaNYL (SUBLIMAZE) injection 50 mcg (50 mcg Intravenous Given 02/15/18 1220)  cefTRIAXone (ROCEPHIN) 1 g in sodium chloride 0.9 % 100 mL IVPB (1 g Intravenous New Bag/Given 02/15/18 1423)  iopamidol  (ISOVUE-370) 76 % injection 100 mL (80 mLs Intravenous Contrast Given 02/15/18 1505)     Initial Impression / Assessment and Plan / ED Course  I have reviewed the triage vital signs and the nursing notes.  Pertinent labs &  imaging results that were available during my care of the patient were reviewed by me and considered in my medical decision making (see chart for details).  This  is a 47 y.o. Female who presents to the ED complaining of fevers, not feeling well, urinary symptoms and generalized abdominal pain for the past week.  She reports yesterday evening she started having some left-sided chest and left lateral rib pain.  She reports her left-sided chest pain and rib pain is constant.  She reports some intermittent SOB. She has more pain with small breaths.  She denies history of MI.  She is a former smoker.  She formally had problems with hypertension.  No history of hyperlipidemia.  She is a diabetic and reports she is been compliant with her medications.  She reports she has had some intermittent dysuria and hematuria for the past week and was seen by PCP last week.  She had her urine checked, but no antibiotics were called in.  She reports she saw on MyChart that her urine culture looked infected. No treatments prior to arrival. Father with family history of MI.   On exam the patient is afebrile and nontoxic-appearing.  Her lungs are clear to auscultation bilaterally.  She does have some left lateral chest wall tenderness to palpation which reproduces her pain.  No CVA or flank tenderness. EKG with normal sinus rhythm. Chest x-ray is unremarkable.  Initial troponin is not elevated. Patient has a leukocytosis with a white count of 10,700.  Lipase is within normal limits.  Mildly elevated alk phos at 470. Urinalysis shows moderate leukocytes and greater than 50 urine red blood cells and white blood cells.  Many bacteria.  On chart review patient had a urine culture from about 5 days ago that  showed 50-100,000 colonies of Klebsiella.  He was sensitive to Rocephin.  We will start Rocephin for the patient.  I suspect patient's pain to her left chest may be related to pyelonephritis.  Despite this we will recheck troponin and d-dimer as patient reports intermittent shortness of breath and pain to her chest. CT renal stone study is unremarkable. No kidney stone.   D-dimer is elevated at 1.16.  Will obtain CT chest with contrast to rule out PE.   I suspect if CT chest PE study is normal her pain is from pyelonephritis. She has two negative troponins.  Low suspicion for ACS.  At shift change patient is awaiting CT chest with contrast.  Patient care signed out to [facility], PA-C at shift change.  If CT study shows no PE patient can be discharged with Keflex 4 times daily for 10 days and follow-up with urology and primary care.    Final Clinical Impressions(s) / ED Diagnoses   Final diagnoses:  Pyelonephritis  Precordial pain  Positive D dimer    ED Discharge Orders    None       Waynetta Pean, PA-C 02/15/18 [place], MD 02/16/18 1621

## 2018-02-15 NOTE — Discharge Instructions (Addendum)
Pain meds Keflex Zofran  There was evidence of urinary infection, likely also affecting the kidneys, called pyelonephritis. Please take all of your antibiotics until finished!   You may develop abdominal discomfort or diarrhea from the antibiotic.  You may help offset this with probiotics which you can buy or get in yogurt. Do not eat or take the probiotics until 2 hours after your antibiotic.  Be sure to stay well-hydrated.  Hydration: Symptoms will be intensified and complicated by dehydration. Dehydration can also extend the duration of symptoms. Drink plenty of fluids and get plenty of rest. You should be drinking at least half a liter of water an hour to stay hydrated. Electrolyte drinks (ex. Gatorade, Powerade, Pedialyte) are also encouraged. You should be drinking enough fluids to make your urine light yellow, almost clear. If this is not the case, you are not drinking enough water. Please note that some of the treatments indicated below will not be effective if you are not adequately hydrated. Pain or fever: Ibuprofen, Naproxen, or Tylenol for pain or fever.  Antiinflammatory medications: Take 600 mg of ibuprofen every 6 hours or 440 mg (over the counter dose) to 500 mg (prescription dose) of naproxen every 12 hours for the next 3 days. After this time, these medications may be used as needed for pain. Take these medications with food to avoid upset stomach. Choose only one of these medications, do not take them together. Tylenol: Should you continue to have additional pain while taking the ibuprofen or naproxen, you may add in tylenol as needed. Your daily total maximum amount of tylenol from all sources should be limited to 4000mg /day for persons without liver problems, or 2000mg /day for those with liver problems. Vicodin: May take Vicodin as needed for severe pain.  Do not drive or perform other dangerous activities while taking the Vicodin.  Please note that each pill of Vicodin contains 325  mg of Tylenol and the above dosage limits apply. Nausea/vomiting: Use the Zofran for nausea or vomiting.  Follow up: Follow up with a primary care provider, as planned, for any future management of this issue. Return: Return to the ED for any worsening symptoms.

## 2018-02-15 NOTE — ED Triage Notes (Signed)
Pt complains of chest pain, shortness of breath since last night. Pt states she had fever chills x 3 days. Pt noticed a positive urine culture on her mychart, states she is concerned about hematuria and groin pain.

## 2018-02-17 ENCOUNTER — Ambulatory Visit: Payer: Self-pay | Attending: Internal Medicine | Admitting: Internal Medicine

## 2018-02-17 ENCOUNTER — Encounter: Payer: Self-pay | Admitting: Internal Medicine

## 2018-02-17 VITALS — BP 117/77 | HR 86 | Temp 98.6°F | Resp 16 | Wt 208.0 lb

## 2018-02-17 DIAGNOSIS — R87629 Unspecified abnormal cytological findings in specimens from vagina: Secondary | ICD-10-CM

## 2018-02-17 DIAGNOSIS — IMO0002 Reserved for concepts with insufficient information to code with codable children: Secondary | ICD-10-CM

## 2018-02-17 DIAGNOSIS — E11649 Type 2 diabetes mellitus with hypoglycemia without coma: Secondary | ICD-10-CM | POA: Insufficient documentation

## 2018-02-17 DIAGNOSIS — M726 Necrotizing fasciitis: Secondary | ICD-10-CM | POA: Insufficient documentation

## 2018-02-17 DIAGNOSIS — Z882 Allergy status to sulfonamides status: Secondary | ICD-10-CM | POA: Insufficient documentation

## 2018-02-17 DIAGNOSIS — N10 Acute tubulo-interstitial nephritis: Secondary | ICD-10-CM | POA: Insufficient documentation

## 2018-02-17 DIAGNOSIS — F17210 Nicotine dependence, cigarettes, uncomplicated: Secondary | ICD-10-CM | POA: Insufficient documentation

## 2018-02-17 DIAGNOSIS — E1142 Type 2 diabetes mellitus with diabetic polyneuropathy: Secondary | ICD-10-CM

## 2018-02-17 DIAGNOSIS — G4733 Obstructive sleep apnea (adult) (pediatric): Secondary | ICD-10-CM | POA: Insufficient documentation

## 2018-02-17 DIAGNOSIS — Z885 Allergy status to narcotic agent status: Secondary | ICD-10-CM | POA: Insufficient documentation

## 2018-02-17 DIAGNOSIS — N12 Tubulo-interstitial nephritis, not specified as acute or chronic: Secondary | ICD-10-CM

## 2018-02-17 DIAGNOSIS — Z79899 Other long term (current) drug therapy: Secondary | ICD-10-CM | POA: Insufficient documentation

## 2018-02-17 DIAGNOSIS — E1165 Type 2 diabetes mellitus with hyperglycemia: Secondary | ICD-10-CM

## 2018-02-17 DIAGNOSIS — R1011 Right upper quadrant pain: Secondary | ICD-10-CM | POA: Insufficient documentation

## 2018-02-17 DIAGNOSIS — I10 Essential (primary) hypertension: Secondary | ICD-10-CM | POA: Insufficient documentation

## 2018-02-17 DIAGNOSIS — Z794 Long term (current) use of insulin: Secondary | ICD-10-CM | POA: Insufficient documentation

## 2018-02-17 MED ORDER — FLUCONAZOLE 150 MG PO TABS: 150.0000 mg | ORAL_TABLET | Freq: Once | ORAL | 0 refills | Status: AC

## 2018-02-17 MED ORDER — CIPROFLOXACIN HCL 500 MG PO TABS: 500.0000 mg | ORAL_TABLET | Freq: Two times a day (BID) | ORAL | 0 refills | Status: DC

## 2018-02-17 MED FILL — FLUCONAZOLE 150 MG TABS: 150 | 3 days supply | Qty: 1 | Fill #0

## 2018-02-17 MED FILL — CIPROFLOXACIN HCL 500 MG TA: 500 | 10 days supply | Qty: 20 | Fill #0

## 2018-02-17 NOTE — Patient Instructions (Signed)
Follow up with [facility] in 2 weeks.  Bring in your blood sugar readings

## 2018-02-17 NOTE — Progress Notes (Signed)
Patient ID: [name], female    DOB: Nov 17, 1970  MRN: [id number]  CC: Hospitalization Follow-up (ED)   Subjective: [name] is a 47 y.o. female who presents for ER follow-up Her concerns today include:  Hx of DM type 2 with peripheral neuropathy, necrotizing fasciitis Rt thigh 03/2017, HTN, dep/anx, tob dep Abn PAP, arthritis  Patient seen in the emergency room 3 days ago with fevers urinary symptoms and abdominal pain x1 week.  She was found to have acute pyelonephritis.  Patient was discharged with Keflex.  Having been on the antibiotics for a few days now fever has resolved, but still having some nausea and left flank pain.  She was seen by our nurse practitioner on 02/09/2018 with urinary symptoms.  Urine culture was positive for Klebsiella.  Patient reports being sent her results via MyChart 2 days after it was resulted.  Prescription for Cipro was sent to an outside pharmacy for which she did not have funds to get it.    DM: She saw our clinical pharmacist since last appointment with me.  Levemir increased to 40 units twice a day.  She is tolerating Trulicity.  Has glucometer with her today but trying to decipher her readings are little confusing, as the dates and times are not accurate.  However her 14-day average is 184.  First low blood sugar that she has had was this morning of 66.  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  She has an appointment with gynecology later this month.    Patient Active Problem List   Diagnosis Date Noted  . Right upper quadrant abdominal pain 12/30/2017  . Polyarthritis 12/30/2017  . Necrotizing fasciitis right thigh s/p I&D 03/17/2017 03/18/2017  . OSA (obstructive sleep apnea) 11/22/2013  . Dyspnea 11/22/2013  . Uncontrolled type 2 diabetes mellitus with hyperosmolar nonketotic hyperglycemia (HCC) 07/21/2012  . HTN (hypertension) 07/21/2012  . Smoker 07/21/2012     Current Outpatient  Medications on File Prior to Visit  Medication Sig Dispense Refill  . diclofenac (VOLTAREN) 75 MG EC tablet Take 1 tablet (75 mg total) by mouth 2 (two) times daily. 30 tablet 1  . Dulaglutide (TRULICITY) 0.75 MG/0.5ML SOPN Inject into skin Subcut Q once a wk 4 pen 5  . esomeprazole (NEXIUM) 20 MG capsule Take 20 mg by mouth daily.     Marland Kitchen FLUoxetine (PROZAC) 40 MG capsule Take 1 capsule (40 mg total) by mouth daily. 30 capsule 11  . gabapentin (NEURONTIN) 300 MG capsule Take 3 capsules (900 mg total) by mouth 2 (two) times daily. 180 capsule 6  . glucose blood (TRUE METRIX BLOOD GLUCOSE TEST) test strip Use as instructed 100 each 12  . HYDROcodone-acetaminophen (NORCO/VICODIN) 5-325 MG tablet Take 1 tablet by mouth every 6 (six) hours as needed for severe pain. 10 tablet 0  . Insulin Detemir (LEVEMIR FLEXTOUCH) 100 UNIT/ML Pen Inject 40 units into the skin twice daily. 15 mL 3  . lisinopril (PRINIVIL,ZESTRIL) 20 MG tablet Take 1 tablet (20 mg total) by mouth daily. (Patient not taking: Reported on 02/09/2018) 30 tablet 3  . ondansetron (ZOFRAN ODT) 4 MG disintegrating tablet Take 1 tablet (4 mg total) by mouth every 8 (eight) hours as needed for nausea or vomiting. 20 tablet 0  . TRUEPLUS SAFETY LANCETS 28G MISC Use as directed 100 each 3   No current facility-administered medications on file prior to visit.     Allergies  Allergen Reactions  . Codeine  Itching  . Sulfa Antibiotics Hives    Social History   Socioeconomic History  . Marital status: Widowed    Spouse name: Not on file  . Number of children: 1  . Years of education: Not on file  . Highest education level: Not on file  Occupational History  . Occupation: none  Social Needs  . Financial resource strain: Not on file  . Food insecurity:    Worry: Not on file    Inability: Not on file  . Transportation needs:    Medical: Not on file    Non-medical: Not on file  Tobacco Use  . Smoking status: Current Every Day Smoker     Packs/day: 1.00    Years: 24.00    Pack years: 24.00    Types: Cigarettes  . Smokeless tobacco: Never Used  Substance and Sexual Activity  . Alcohol use: Yes    Comment: seldom  . Drug use: No    Types: Marijuana    Comment: 25 years ago 11/22/13  . Sexual activity: Never  Lifestyle  . Physical activity:    Days per week: Not on file    Minutes per session: Not on file  . Stress: Not on file  Relationships  . Social connections:    Talks on phone: Not on file    Gets together: Not on file    Attends religious service: Not on file    Active member of club or organization: Not on file    Attends meetings of clubs or organizations: Not on file    Relationship status: Not on file  . Intimate partner violence:    Fear of current or ex partner: Not on file    Emotionally abused: Not on file    Physically abused: Not on file    Forced sexual activity: Not on file  Other Topics Concern  . Not on file  Social History Narrative  . Not on file    Family History  Problem Relation Age of Onset  . Aneurysm Mother   . Heart attack Father   . Stroke Father   . Breast cancer Paternal Grandmother     Past Surgical History:  Procedure Laterality Date  . CARDIAC CATHETERIZATION     4-48yrs ago  . CHOLECYSTECTOMY    . ENDOMETRIAL ABLATION     6 years ago  . IRRIGATION AND DEBRIDEMENT ABSCESS Right 03/17/2017   Procedure: IRRIGATION AND DEBRIDEMENT RIGHT THIGH ABSCESS;  Surgeon: Karie Soda, MD;  Location: [facility];  Service: General;  Laterality: Right;  . LAPAROSCOPIC APPENDECTOMY  10/04/2011   Procedure: APPENDECTOMY LAPAROSCOPIC;  Surgeon: Rulon Abide, DO;  Location: [facility];  Service: General;  Laterality: N/A;    ROS: Review of Systems Negative except as stated above PHYSICAL EXAM: BP 117/77   Pulse 86   Temp 98.6 F (37 C) (Oral)   Resp 16   Wt 208 lb (94.3 kg)   SpO2 97%   BMI 30.72 kg/m   Physical Exam  General appearance - alert, well appearing, and in no  distress Mental status - normal mood, behavior, speech, dress, motor activity, and thought processes Abdomen -mild left flank tenderness.   ASSESSMENT AND PLAN: 1. Pyelonephritis Change Keflex to Cipro. I will speak with the nurse practitioner who saw her earlier this month about patient's concern of delay in receiving results of urine culture. - ciprofloxacin (CIPRO) 500 MG tablet; Take 1 tablet (500 mg total) by mouth 2 (two) times daily.  Dispense: 20 tablet; Refill: 0  2. Uncontrolled type 2 diabetes mellitus with peripheral neuropathy (HCC) -Advised patient to write down and log her blood sugar readings and bring them in in 2 weeks with our clinical pharmacist.  Looking at her meter who is confusing because dates and times of readings are not correct.  3. Abnormal vaginal Pap smear Keep appointment with gynecology  Patient was given the opportunity to ask questions.  Patient verbalized understanding of the plan and was able to repeat key elements of the plan.   No orders of the defined types were placed in this encounter.    Requested Prescriptions   Signed Prescriptions Disp Refills  . ciprofloxacin (CIPRO) 500 MG tablet 20 tablet 0    Sig: Take 1 tablet (500 mg total) by mouth 2 (two) times daily.  . fluconazole (DIFLUCAN) 150 MG tablet 1 tablet 0    Sig: Take 1 tablet (150 mg total) by mouth once for 1 dose.    Return in about 2 months (around 04/19/2018).  Jonah Blue, MD, FACP

## 2018-02-21 ENCOUNTER — Ambulatory Visit: Payer: Medicaid Other | Admitting: Advanced Practice Midwife

## 2018-02-21 MED FILL — !TRULICITY 0.75 MG/0.5 ML P: 0.75 | 28 days supply | Qty: 1 | Fill #2

## 2018-02-23 MED FILL — DICLOFENAC SOD EC 75 MG TAB: 75 | 15 days supply | Qty: 30 | Fill #1

## 2018-02-25 ENCOUNTER — Other Ambulatory Visit (HOSPITAL_COMMUNITY)
Admission: RE | Admit: 2018-02-25 | Discharge: 2018-02-25 | Disposition: A | Discharge status: Home / Self Care | Payer: Medicaid Other | Source: Ambulatory Visit | Attending: Advanced Practice Midwife | Admitting: Advanced Practice Midwife

## 2018-02-25 ENCOUNTER — Ambulatory Visit (INDEPENDENT_AMBULATORY_CARE_PROVIDER_SITE_OTHER): Payer: Self-pay | Admitting: Obstetrics and Gynecology

## 2018-02-25 ENCOUNTER — Encounter: Payer: Self-pay | Admitting: Obstetrics and Gynecology

## 2018-02-25 DIAGNOSIS — R87613 High grade squamous intraepithelial lesion on cytologic smear of cervix (HGSIL): Secondary | ICD-10-CM | POA: Insufficient documentation

## 2018-02-25 LAB — POCT PREGNANCY, URINE: Preg Test, Ur: NEGATIVE

## 2018-02-25 NOTE — Patient Instructions (Signed)
Colposcopy, Care After  This sheet gives you information about how to care for yourself after your procedure. Your doctor may also give you more specific instructions. If you have problems or questions, contact your doctor.  What can I expect after the procedure?  If you did not have a tissue sample removed (did not have a biopsy), you may only have some spotting for a few days. You can go back to your normal activities.  If you had a tissue sample removed, it is common to have:  · Soreness and pain. This may last for a few days.  · Light-headedness.  · Mild bleeding from your vagina or dark-colored, grainy discharge from your vagina. This may last for a few days. You may need to wear a sanitary pad.  · Spotting for at least 48 hours after the procedure.    Follow these instructions at home:  · Take over-the-counter and prescription medicines only as told by your doctor. Ask your doctor what medicines you can start taking again. This is very important if you take blood-thinning medicine.  · Do not drive or use heavy machinery while taking prescription pain medicine.  · For 3 days, or as long as your doctor tells you, avoid:  ? Douching.  ? Using tampons.  ? Having sex.  · If you use birth control (contraception), keep using it.  · Limit activity for the first day after the procedure. Ask your doctor what activities are safe for you.  · It is up to you to get the results of your procedure. Ask your doctor when your results will be ready.  · Keep all follow-up visits as told by your doctor. This is important.  Contact a doctor if:  · You get a skin rash.  Get help right away if:  · You are bleeding a lot from your vagina. It is a lot of bleeding if you are using more than one pad an hour for 2 hours in a row.  · You have clumps of blood (blood clots) coming from your vagina.  · You have a fever.  · You have chills  · You have pain in your lower belly (pelvic area).  · You have signs of infection, such as vaginal  discharge that is:  ? Different than usual.  ? Yellow.  ? Bad-smelling.  · You have very pain or cramps in your lower belly that do not get better with medicine.  · You feel light-headed.  · You feel dizzy.  · You pass out (faint).  Summary  · If you did not have a tissue sample removed (did not have a biopsy), you may only have some spotting for a few days. You can go back to your normal activities.  · If you had a tissue sample removed, it is common to have mild pain and spotting for 48 hours.  · For 3 days, or as long as your doctor tells you, avoid douching, using tampons and having sex.  · Get help right away if you have bleeding, very bad pain, or signs of infection.  This information is not intended to replace advice given to you by your health care provider. Make sure you discuss any questions you have with your health care provider.  Document Released: 02/03/2008 Document Revised: 05/06/2016 Document Reviewed: 05/06/2016  Elsevier Interactive Patient Education © 2018 [employer]

## 2018-02-25 NOTE — Progress Notes (Signed)
    GYNECOLOGY CLINIC COLPOSCOPY PROCEDURE NOTE  47 y.o. 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 Discussed role for HPV in cervical dysplasia, need for surveillance. Sexual active with same partner. No history of abnormal pap smears. H/O endometrial ablation about 10 yrs ago.  UPT negative  Patient given informed consent, signed copy in the chart, time out was performed.  Placed in lithotomy position. Cervix viewed with speculum and colposcope after application of acetic acid.   Colposcopy adequate? Yes  no visible lesions and acetowhite lesion(s) noted at 12 and 6 o'clock; corresponding biopsies obtained.  ECC specimen obtained. Monsel's applied. All specimens were labelled and sent to pathology.   Patient was given post procedure instructions.  Will follow up pathology and manage accordingly.     Hermina Staggers, MD, [facility], [facility]

## 2018-03-01 ENCOUNTER — Telehealth: Payer: Self-pay

## 2018-03-01 NOTE — Telephone Encounter (Signed)
Patient called requesting biopsy results. 

## 2018-03-02 NOTE — Telephone Encounter (Signed)
Per Dr Alysia Penna, Please let [name] know that her Bx showed HGSIL.  Recommend CKC as treatment  Can schedule appt to discuss more or if she is OK will schedule for CKC.  Called patient and informed her of results. Explained results to patient and recommended CKC procedure. Patient wants CKC procedure for complete treatment of the abnormal cells but had a lot of questions regarding the results & the procedure itself. Recommended she return to our office to discuss these results with Dr Alysia Penna further. Patient is agreeable to this & will await a phone call from the front office for an appt.

## 2018-03-04 ENCOUNTER — Ambulatory Visit: Payer: Medicaid Other | Admitting: Pharmacist

## 2018-03-04 ENCOUNTER — Telehealth: Payer: Self-pay | Admitting: General Practice

## 2018-03-04 NOTE — Telephone Encounter (Signed)
Patient notified of appointment on 03/28/18 at 3:35pm with Dr. Alysia Penna.  Patient verbalized understanding.

## 2018-03-07 ENCOUNTER — Encounter: Payer: Self-pay | Admitting: Obstetrics and Gynecology

## 2018-03-11 ENCOUNTER — Encounter: Payer: Self-pay | Admitting: *Deleted

## 2018-03-11 ENCOUNTER — Ambulatory Visit: Payer: Medicaid Other | Admitting: Pharmacist

## 2018-03-11 MED FILL — $LEVEMIR FLEXTOUCH 100 UNIT: 100 | 18 days supply | Qty: 15 | Fill #0

## 2018-03-11 MED FILL — FLUoxetine HCL 40 MG CAPS: 40 | 30 days supply | Qty: 30 | Fill #3

## 2018-03-11 MED FILL — GABAPENTIN 300 MG CAPSULE: 300 | 30 days supply | Qty: 180 | Fill #2

## 2018-03-28 ENCOUNTER — Encounter: Payer: Self-pay | Admitting: Obstetrics and Gynecology

## 2018-03-28 ENCOUNTER — Ambulatory Visit (INDEPENDENT_AMBULATORY_CARE_PROVIDER_SITE_OTHER): Payer: Self-pay | Admitting: Obstetrics and Gynecology

## 2018-03-28 VITALS — BP 114/80 | HR 80 | Ht 69.0 in | Wt 209.7 lb

## 2018-03-28 DIAGNOSIS — R87613 High grade squamous intraepithelial lesion on cytologic smear of cervix (HGSIL): Secondary | ICD-10-CM

## 2018-03-28 NOTE — Progress Notes (Signed)
Ms Hanover is here to discuss her colpo results.  Bx results demonstrated high grade dysplasia Reviewed with pt CKC recommended and reviewed with pt.  PE AF VSS Lungs clear Heart RRR Abd soft + bs  A/P High cervical dysplasia  CKC reviewed with pt. R/B/Post op care discussed Pt desires to schedule Over 50% of time spent face to face with pt.  F/U with post op appt.

## 2018-03-28 NOTE — Patient Instructions (Signed)
Cervical Conization °Cervical conization (cone biopsy) is a procedure in which a cone-shaped portion of the cervix is cut out so that it can be examined under a microscope. The procedure is done to check for cancer cells or cells that might turn into cancer (precancerous cells). You may have this procedure if: °· You have abnormal bleeding from your cervix. °· You had an abnormal Pap test. °· Something abnormal was seen on your cervix during an exam. ° °This procedure is performed in either a health care provider’s office or in an operating room. °Tell a health care provider about: °· Any allergies you have. °· All medicines you are taking, including vitamins, herbs, eye drops, creams, and over-the-counter medicines. °· Any problems you or family members have had with the use of anesthetic medicines. °· Any blood disorders you have. °· Any surgeries you have had. °· Any medical conditions you have. °· Your smoking habits. °· When you normally have your period. °· Whether you are pregnant or may be pregnant. °What are the risks? °Generally, this is a safe procedure. However, problems may occur, including: °· Heavy bleeding for several days or weeks after the procedure. °· Allergic reactions to medicines or dyes. °· Increased risk of preterm labor in future pregnancies. °· Infection (rare). °· Damage to the cervix or other structures or organs (rare). ° °What happens before the procedure? °Staying hydrated °Follow instructions from your health care provider about hydration, which may include: °· Up to 2 hours before the procedure - you may continue to drink clear liquids, such as water, clear fruit juice, black coffee, and plain tea. ° °Eating and drinking restrictions °Follow instructions from your health care provider about eating and drinking, which may include: °· 8 hours before the procedure - stop eating heavy meals or foods such as meat, fried foods, or fatty foods. °· 6 hours before the procedure - stop eating  light meals or foods, such as toast or cereal. °· 6 hours before the procedure - stop drinking milk or drinks that contain milk. °· 2 hours before the procedure - stop drinking clear liquids. ° °General instructions °· Do not douche, have sex, use tampons, or use any vaginal medicines before the procedure as told by your health care provider. °· You may be asked to empty your bladder and bowel right before the procedure. °· Ask your health care provider about: °? Changing or stopping your normal medicines. This is important if you take diabetes medicines or blood thinners. °? Taking medicines such as aspirin and ibuprofen. These medicines can thin your blood. Do not take these medicines before your procedure if your doctor tells you not to. °· Plan to have someone take you home from the hospital or clinic. °What happens during the procedure? °· To reduce your risk of infection: °? Your health care team will wash or sanitize their hands. °? Your skin will be washed with soap. °? Hair may be removed from the surgical area. °· You will undress from the waist down and be given a gown to wear. °· You will lie on an examining table and put your feet in stirrups. °· An IV tube will be inserted into one of your veins. °· You will be given one or more of the following: °? A medicine to help you relax (sedative). °? A medicine to numb the area (local anesthetic). °? A medicine to make you fall asleep (general anesthetic). °? A medicine that numbs the cervix (cervical block). °·   A lubricated device called a speculum will be inserted into your vagina. It will be used to spread open the walls of the vagina so your health care provider can see the inside of the vagina and cervix better. °· An instrument that has a magnifying lens and a light (colposcope) will let your health care provider examine the cervix more closely. °· Your health care provider will apply a solution to your cervix. This turns abnormal areas a pale  color. °· A tissue sample will be removed from the cervix using one of the following methods: °? The cold knife method. In this method, the tissue is cut out with a knife (scalpel). °? The loop electrosurgical excision procedure (LEEP) method. In this method, the tissue is cut out with a thin wire that can burn (cauterize) the tissue with an electrical current. °? Laser treatment method. In this method, the tissue is cut out and then cauterized with a laser beam to prevent bleeding. °· Your health care provider will apply a paste over the biopsy areas to help control bleeding. °· The tissue sample will be examined under a microscope. °The procedure may vary among health care providers and hospitals. °What happens after the procedure? °· Your blood pressure, heart rate, breathing rate, and blood oxygen level will be monitored often until the medicines you were given have worn off. °· If you were given a local anesthetic, you will rest at the clinic or hospital until you are stable and feel ready to go home. °· If you were given a general anesthetic, you may be monitored for a longer period of time. °· You may have some cramping. °· You may have bloody discharge or light to moderate bleeding. °· You may have dark discharge coming from your vagina. This is from the paste used on the cervix to prevent bleeding. °Summary °· Cervical conization is a procedure in which a cone-shaped portion of the cervix is cut out so that it can be examined under a microscope. °· The procedure is done to check for cancer cells or cells that might turn into cancer (precancerous cells). °This information is not intended to replace advice given to you by your health care provider. Make sure you discuss any questions you have with your health care provider. °Document Released: 05/27/2005 Document Revised: 08/19/2016 Document Reviewed: 08/19/2016 °Elsevier Interactive Patient Education © 2017 Elsevier Inc. ° °

## 2018-03-29 ENCOUNTER — Encounter (HOSPITAL_COMMUNITY): Payer: Self-pay

## 2018-04-12 ENCOUNTER — Other Ambulatory Visit: Payer: Self-pay | Admitting: Nurse Practitioner

## 2018-04-12 ENCOUNTER — Encounter (HOSPITAL_BASED_OUTPATIENT_CLINIC_OR_DEPARTMENT_OTHER): Payer: Self-pay | Admitting: *Deleted

## 2018-04-12 DIAGNOSIS — M130 Polyarthritis, unspecified: Secondary | ICD-10-CM

## 2018-04-12 MED FILL — FLUoxetine HCL 40 MG CAPS: 40 | 30 days supply | Qty: 30 | Fill #0

## 2018-04-12 MED FILL — GABAPENTIN 300 MG CAPSULE: 300 | 30 days supply | Qty: 180 | Fill #3

## 2018-04-12 MED FILL — DICLOFENAC SOD EC 75 MG TAB: 75 | 15 days supply | Qty: 30 | Fill #0

## 2018-04-14 MED FILL — $TRULICITY 0.75 MG/0.5 ML P: 0.75 | 90 days supply | Qty: 6 | Fill #0

## 2018-04-18 ENCOUNTER — Encounter (HOSPITAL_BASED_OUTPATIENT_CLINIC_OR_DEPARTMENT_OTHER)
Admission: RE | Admit: 2018-04-18 | Discharge: 2018-04-18 | Disposition: A | Discharge status: Home / Self Care | Payer: Medicaid Other | Source: Ambulatory Visit | Attending: Obstetrics and Gynecology | Admitting: Obstetrics and Gynecology

## 2018-04-18 DIAGNOSIS — Z01812 Encounter for preprocedural laboratory examination: Secondary | ICD-10-CM | POA: Insufficient documentation

## 2018-04-18 LAB — CBC
HCT: 42.9 % (ref 36.0–46.0)
Hemoglobin: 14.1 g/dL (ref 12.0–15.0)
MCH: 28 pg (ref 26.0–34.0)
MCHC: 32.9 g/dL (ref 30.0–36.0)
MCV: 85.3 fL (ref 78.0–100.0)
Platelets: 223 10*3/uL (ref 150–400)
RBC: 5.03 MIL/uL (ref 3.87–5.11)
RDW: 13 % (ref 11.5–15.5)
WBC: 6.2 10*3/uL (ref 4.0–10.5)

## 2018-04-18 LAB — BASIC METABOLIC PANEL
Anion gap: 8 (ref 5–15)
BUN: 21 mg/dL — ABNORMAL HIGH (ref 6–20)
CO2: 26 mmol/L (ref 22–32)
Calcium: 9.3 mg/dL (ref 8.9–10.3)
Chloride: 104 mmol/L (ref 98–111)
Creatinine, Ser: 1 mg/dL (ref 0.44–1.00)
GFR calc Af Amer: >60 mL/min (ref >60)
GFR calc non Af Amer: >60 mL/min (ref >60)
Glucose, Bld: 212 mg/dL — ABNORMAL HIGH (ref 70–99)
Potassium: 4.5 mmol/L (ref 3.5–5.1)
Sodium: 138 mmol/L (ref 135–145)

## 2018-04-18 LAB — POCT PREGNANCY, URINE: Preg Test, Ur: NEGATIVE

## 2018-04-20 ENCOUNTER — Ambulatory Visit (HOSPITAL_BASED_OUTPATIENT_CLINIC_OR_DEPARTMENT_OTHER)
Admission: RE | Admit: 2018-04-20 | Payer: Medicaid Other | Source: Ambulatory Visit | Admitting: Obstetrics and Gynecology

## 2018-04-20 HISTORY — DX: Dysplasia of cervix uteri, unspecified: N87.9

## 2018-04-20 HISTORY — DX: Gastro-esophageal reflux disease without esophagitis: K21.9

## 2018-04-20 SURGERY — CONE BIOPSY, CERVIX
Anesthesia: Choice

## 2018-04-26 ENCOUNTER — Ambulatory Visit: Payer: Self-pay | Attending: Internal Medicine | Admitting: Internal Medicine

## 2018-04-26 ENCOUNTER — Telehealth: Payer: Self-pay | Admitting: General Practice

## 2018-04-26 ENCOUNTER — Encounter: Payer: Self-pay | Admitting: Internal Medicine

## 2018-04-26 VITALS — BP 138/87 | HR 73 | Temp 98.1°F | Resp 16 | Wt 212.2 lb

## 2018-04-26 DIAGNOSIS — I10 Essential (primary) hypertension: Secondary | ICD-10-CM | POA: Insufficient documentation

## 2018-04-26 DIAGNOSIS — Z794 Long term (current) use of insulin: Secondary | ICD-10-CM | POA: Insufficient documentation

## 2018-04-26 DIAGNOSIS — F17210 Nicotine dependence, cigarettes, uncomplicated: Secondary | ICD-10-CM | POA: Insufficient documentation

## 2018-04-26 DIAGNOSIS — M726 Necrotizing fasciitis: Secondary | ICD-10-CM | POA: Insufficient documentation

## 2018-04-26 DIAGNOSIS — M130 Polyarthritis, unspecified: Secondary | ICD-10-CM

## 2018-04-26 DIAGNOSIS — R87629 Unspecified abnormal cytological findings in specimens from vagina: Secondary | ICD-10-CM

## 2018-04-26 DIAGNOSIS — G4733 Obstructive sleep apnea (adult) (pediatric): Secondary | ICD-10-CM | POA: Insufficient documentation

## 2018-04-26 DIAGNOSIS — F32A Depression, unspecified: Secondary | ICD-10-CM

## 2018-04-26 DIAGNOSIS — Z79899 Other long term (current) drug therapy: Secondary | ICD-10-CM | POA: Insufficient documentation

## 2018-04-26 DIAGNOSIS — E1165 Type 2 diabetes mellitus with hyperglycemia: Secondary | ICD-10-CM | POA: Insufficient documentation

## 2018-04-26 DIAGNOSIS — IMO0001 Reserved for inherently not codable concepts without codable children: Secondary | ICD-10-CM

## 2018-04-26 DIAGNOSIS — F329 Major depressive disorder, single episode, unspecified: Secondary | ICD-10-CM | POA: Insufficient documentation

## 2018-04-26 DIAGNOSIS — F419 Anxiety disorder, unspecified: Secondary | ICD-10-CM | POA: Insufficient documentation

## 2018-04-26 DIAGNOSIS — E1140 Type 2 diabetes mellitus with diabetic neuropathy, unspecified: Secondary | ICD-10-CM | POA: Insufficient documentation

## 2018-04-26 LAB — GLUCOSE, POCT (MANUAL RESULT ENTRY): POC Glucose: 236 mg/dl — AB (ref 70–99)

## 2018-04-26 MED ORDER — DICLOFENAC SODIUM 75 MG PO TBEC: 75.0000 mg | DELAYED_RELEASE_TABLET | Freq: Two times a day (BID) | ORAL | 2 refills | Status: DC

## 2018-04-26 MED ORDER — CLONAZEPAM 0.5 MG PO TABS: 0.5000 mg | ORAL_TABLET | Freq: Every day | ORAL | 0 refills | Status: DC | PRN

## 2018-04-26 MED ORDER — DULAGLUTIDE 0.75 MG/0.5ML ~~LOC~~ SOAJ: SUBCUTANEOUS | 5 refills | Status: DC

## 2018-04-26 MED FILL — DICLOFENAC SOD EC 75 MG TAB: 75 | 30 days supply | Qty: 60 | Fill #0

## 2018-04-26 NOTE — Telephone Encounter (Signed)
Patient called into front office very upset regarding surgery being canceled last week. Patient states she wasn't notified and showed up to the hospital only to find out it was canceled. Patient states she sent a mychart message last week to Dr Alysia Penna and hasn't heard anything back from anyone about what is going on & how she is to proceed forward. Patient states she is highly upset and frustrated with how everything has gone down. Patient states she made plans and arrangements for this and should've been notified in advance. Patient states her A1C was back in April and she saw Dr Alysia Penna in June & July and it should've been caught then and she could've had labs at that time. Patient states her sister drove up all the way from [place] to help her with this & her surgery ended up being canceled. Patient states no one has even contacted her about coming in for labs. Apologized to patient for breakdown in care and miscommunication. Explained to patient that Dr Alysia Penna did reply to her mychart message promptly but the reply went back to one of the office nurses instead of her. Read mychart message to patient. Patient states she is fine to come in for lab work but that could've been ordered a long time ago. Patient states her a1c is never going to be 8 or less because it hasn't been in over 10 years. Patient states that lab value isn't possible for her and will never happen. Patient states she has these precancerous cells in her body and she wants them out before they are cancerous. Patient states she knows this isn't a priority or big deal to [place] but it's a very big deal to her. Patient states I know I have Medicaid but I'm a person too and I just want to have the procedure before it becomes cancer. Patient states I guess even if I come in dying or hemorrhaging to death you all wouldn't do anything to save me because my a1c is too high. Apologized to patient and explained that we would of course treat her in an emergency type  of situation but this surgery isn't viewed as emergent although it's certainly important. Patient states it feels emergent to her. Patient states she just wants everything taken out & taken care of. Asked patient what could I do for her to help in this situation. Patient states she wants the procedure done asap but she guesses that won't happen because her a1c will be elevated. Patient asked how long this will be delayed with her a1c being elevated because she knows it will be. Offered patient appt in office to see Dr Alysia Penna & have questions answered. Patient asked how that will be helpful. Told patient she understandably has a lot of questions and concerns and I'm not certain how to answer them for her or what guidance to give her. Told patient she can come in and talk to Dr Alysia Penna & ask him questions and we could also do necessary labs at that appt. Offered 8/29 @ 1:15pm and patient is agreeable. Patient had no other questions.

## 2018-04-26 NOTE — Patient Instructions (Signed)

## 2018-04-26 NOTE — Progress Notes (Signed)
Patient ID: [name], female    DOB: [birth year]  MRN: [id number]  CC: Diabetes   Subjective: [name] is a 48 y.o. female who presents for chronic ds management.  Her concerns today include:  Hx ofDMtype 2 with peripheral neuropathy,necrotizingfasciitis Rt thigh8/2018, HTN, dep/anx, tobdep, Abn PAP, arthritis  Abnormal PAP:  colpo was abnormal.  Plan is for conization.  Was scheduled for the procedure last wk but was cancelled 2 days prior due to her A1C > 15 in April.  She is stressed about the dx and feels that the delay just gives the precancerous cells more time to become cancerous.  She recently also moved her mother in to live with her and this is causing some stress.   "I feel like I have a wgh on my shoulder and I'm going to explode."  She is requesting a short course of Ativan to help her control anxiety level at this time.  Reportedly has hydroxyzine at home which he takes only as needed at bedtime.  Was hoping to see our LCSW today but she is currently out of the office.  DM:  Checking BS BID. Range a.m highest and lowest 60 and 220; range 110-160 a.m, before dinner 160-210.  Compliant with Levemir BID 40 and Trulicity. Missed 2 Trilicity shots when she was moving her mother down here other than that she has not missed any doses Doing good with eating habits.   Not getting in as much exercise as she should.     Patient Active Problem List   Diagnosis Date Noted  . HGSIL (high grade squamous intraepithelial lesion) on Pap smear of cervix 02/25/2018  . Right upper quadrant abdominal pain 12/30/2017  . Polyarthritis 12/30/2017  . Necrotizing fasciitis right thigh s/p I&D 03/17/2017 03/18/2017  . OSA (obstructive sleep apnea) 11/22/2013  . Dyspnea 11/22/2013  . Uncontrolled type 2 diabetes mellitus with hyperosmolar nonketotic hyperglycemia (HCC) 07/21/2012  . HTN (hypertension) 07/21/2012  . Smoker 07/21/2012     Current Outpatient Medications on File  Prior to Visit  Medication Sig Dispense Refill  . esomeprazole (NEXIUM) 20 MG capsule Take 20 mg by mouth daily.     Marland Kitchen FLUoxetine (PROZAC) 40 MG capsule Take 1 capsule (40 mg total) by mouth daily. 30 capsule 11  . gabapentin (NEURONTIN) 300 MG capsule Take 3 capsules (900 mg total) by mouth 2 (two) times daily. 180 capsule 6  . glucose blood (TRUE METRIX BLOOD GLUCOSE TEST) test strip Use as instructed 100 each 12  . Insulin Detemir (LEVEMIR FLEXTOUCH) 100 UNIT/ML Pen Inject 40 units into the skin twice daily. 15 mL 3  . TRUEPLUS SAFETY LANCETS 28G MISC Use as directed 100 each 3   No current facility-administered medications on file prior to visit.     Allergies  Allergen Reactions  . Codeine Itching  . Sulfa Antibiotics Hives    Social History   Socioeconomic History  . Marital status: Widowed    Spouse name: Not on file  . Number of children: 1  . Years of education: Not on file  . Highest education level: Not on file  Occupational History  . Occupation: none  Social Needs  . Financial resource strain: Not on file  . Food insecurity:    Worry: Not on file    Inability: Not on file  . Transportation needs:    Medical: Not on file    Non-medical: Not on file  Tobacco Use  . Smoking status: Current  Every Day Smoker    Packs/day: 1.00    Years: 24.00    Pack years: 24.00    Types: E-cigarettes  . Smokeless tobacco: Never Used  . Tobacco comment: vapes with nicotene  Substance and Sexual Activity  . Alcohol use: Yes    Comment: seldom  . Drug use: No    Types: Marijuana    Comment: 25 years ago 11/22/13  . Sexual activity: Yes    Birth control/protection: None    Comment: has had endometrial ablation  Lifestyle  . Physical activity:    Days per week: Not on file    Minutes per session: Not on file  . Stress: Not on file  Relationships  . Social connections:    Talks on phone: Not on file    Gets together: Not on file    Attends religious service: Not on  file    Active member of club or organization: Not on file    Attends meetings of clubs or organizations: Not on file    Relationship status: Not on file  . Intimate partner violence:    Fear of current or ex partner: Not on file    Emotionally abused: Not on file    Physically abused: Not on file    Forced sexual activity: Not on file  Other Topics Concern  . Not on file  Social History Narrative  . Not on file    Family History  Problem Relation Age of Onset  . Aneurysm Mother   . Heart attack Father   . Stroke Father   . Breast cancer Paternal Grandmother     Past Surgical History:  Procedure Laterality Date  . CARDIAC CATHETERIZATION     4-18yrs ago  . CHOLECYSTECTOMY    . ENDOMETRIAL ABLATION     6 years ago  . IRRIGATION AND DEBRIDEMENT ABSCESS Right 03/17/2017   Procedure: IRRIGATION AND DEBRIDEMENT RIGHT THIGH ABSCESS;  Surgeon: Karie Soda, MD;  Location: [facility];  Service: General;  Laterality: Right;  . LAPAROSCOPIC APPENDECTOMY  10/04/2011   Procedure: APPENDECTOMY LAPAROSCOPIC;  Surgeon: Rulon Abide, DO;  Location: [facility];  Service: General;  Laterality: N/A;    ROS: Review of Systems Negative except as above. PHYSICAL EXAM: BP 138/87   Pulse 73   Temp 98.1 F (36.7 C) (Oral)   Resp 16   Wt 212 lb 3.2 oz (96.3 kg)   SpO2 99%   BMI 31.34 kg/m   Physical Exam  General appearance - alert, well appearing, and in no distress Mental status -patient seems very stressed and is tearful at times Chest - clear to auscultation, no wheezes, rales or rhonchi, symmetric air entry Heart - normal rate, regular rhythm, normal S1, S2, no murmurs, rubs, clicks or gallops  Results for orders placed or performed in visit on 04/26/18  POCT glucose (manual entry)  Result Value Ref Range   POC Glucose 236 (A) 70 - 99 mg/dl     ASSESSMENT AND PLAN: 1. Diabetes mellitus type 2, uncontrolled, without complications (HCC) Discussed the importance of good diabetes  control prior to her GYN procedure for better healing.  We do not have A1c cartridges here today so we will draw it as a blood test.  I will get back to her once I have the results for further instructions on adjustment of her medications.  In the meantime I encourage continued healthy eating habits and trying to be more active. - POCT glucose (manual entry) - Hemoglobin  A1c - Microalbumin / creatinine urine ratio - Dulaglutide (TRULICITY) 0.75 MG/0.5ML SOPN; Inject subcut once a week  Dispense: 4 pen; Refill: 5  2. Polyarthritis Patient requesting refill on Voltaren - diclofenac (VOLTAREN) 75 MG EC tablet; Take 1 tablet (75 mg total) by mouth 2 (two) times daily.  Dispense: 60 tablet; Refill: 2  3. Abnormal vaginal Pap smear She will keep a follow-up appointment with the gynecologist later this week.  She will discuss her concerns about the delay in having the procedure done.  4. Anxiety and depression Continue Prozac.  I have given her a limited supply of clonazepam to take as needed.  Goal is not for long-term maintenance on the clonazepam. - clonazePAM (KLONOPIN) 0.5 MG tablet; Take 1 tablet (0.5 mg total) by mouth daily as needed for anxiety.  Dispense: 20 tablet; Refill: 0    Patient was given the opportunity to ask questions.  Patient verbalized understanding of the plan and was able to repeat key elements of the plan.   Orders Placed This Encounter  Procedures  . Hemoglobin A1c  . Microalbumin / creatinine urine ratio  . POCT glucose (manual entry)     Requested Prescriptions   Signed Prescriptions Disp Refills  . diclofenac (VOLTAREN) 75 MG EC tablet 60 tablet 2    Sig: Take 1 tablet (75 mg total) by mouth 2 (two) times daily.  . clonazePAM (KLONOPIN) 0.5 MG tablet 20 tablet 0    Sig: Take 1 tablet (0.5 mg total) by mouth daily as needed for anxiety.  . Dulaglutide (TRULICITY) 0.75 MG/0.5ML SOPN 4 pen 5    Sig: Inject subcut once a week    Return in about 10 weeks  (around 07/05/2018).  Jonah Blue, MD, FACP

## 2018-04-27 ENCOUNTER — Telehealth: Payer: Self-pay

## 2018-04-27 ENCOUNTER — Other Ambulatory Visit: Payer: Self-pay | Admitting: Internal Medicine

## 2018-04-27 LAB — MICROALBUMIN / CREATININE URINE RATIO
Creatinine, Urine: 87.6 mg/dL
Microalb/Creat Ratio: 5.8 mg/g creat (ref 0.0–30.0)
Microalbumin, Urine: 5.1 ug/mL

## 2018-04-27 LAB — HEMOGLOBIN A1C
Est. average glucose Bld gHb Est-mCnc: 275 mg/dL
Hgb A1c MFr Bld: 11.2 % — ABNORMAL HIGH (ref 4.8–5.6)

## 2018-04-27 MED ORDER — METFORMIN HCL 500 MG PO TABS: 500.0000 mg | ORAL_TABLET | Freq: Two times a day (BID) | ORAL | 3 refills | Status: DC

## 2018-04-27 NOTE — Telephone Encounter (Signed)
Contacted pt to go over lab results pt is aware and doesn't have any questions or concerns 

## 2018-04-28 ENCOUNTER — Encounter: Payer: Self-pay | Admitting: Obstetrics and Gynecology

## 2018-04-28 ENCOUNTER — Ambulatory Visit (INDEPENDENT_AMBULATORY_CARE_PROVIDER_SITE_OTHER): Payer: Self-pay | Admitting: Obstetrics and Gynecology

## 2018-04-28 ENCOUNTER — Ambulatory Visit (INDEPENDENT_AMBULATORY_CARE_PROVIDER_SITE_OTHER): Payer: Self-pay | Admitting: Clinical

## 2018-04-28 ENCOUNTER — Encounter (HOSPITAL_COMMUNITY): Payer: Self-pay

## 2018-04-28 VITALS — BP 117/73 | HR 79 | Ht 69.0 in | Wt 213.3 lb

## 2018-04-28 DIAGNOSIS — F329 Major depressive disorder, single episode, unspecified: Secondary | ICD-10-CM

## 2018-04-28 DIAGNOSIS — F430 Acute stress reaction: Secondary | ICD-10-CM

## 2018-04-28 DIAGNOSIS — F32A Depression, unspecified: Secondary | ICD-10-CM

## 2018-04-28 DIAGNOSIS — R87613 High grade squamous intraepithelial lesion on cytologic smear of cervix (HGSIL): Secondary | ICD-10-CM

## 2018-04-28 MED ORDER — LORAZEPAM 1 MG PO TABS: 1.0000 mg | ORAL_TABLET | Freq: Two times a day (BID) | ORAL | 0 refills | Status: DC

## 2018-04-28 NOTE — Progress Notes (Signed)
Elevated gad7 and phq9- offered to see our behavioral health clinician and she would like to see them today.

## 2018-04-28 NOTE — Patient Instructions (Signed)
Cervical Conization Cervical conization (cone biopsy) is a procedure in which a cone-shaped portion of the cervix is cut out so that it can be examined under a microscope. The procedure is done to check for cancer cells or cells that might turn into cancer (precancerous cells). You may have this procedure if:  You have abnormal bleeding from your cervix.  You had an abnormal Pap test.  Something abnormal was seen on your cervix during an exam.  This procedure is performed in either a health care provider's office or in an operating room. Tell a health care provider about:  Any allergies you have.  All medicines you are taking, including vitamins, herbs, eye drops, creams, and over-the-counter medicines.  Any problems you or family members have had with the use of anesthetic medicines.  Any blood disorders you have.  Any surgeries you have had.  Any medical conditions you have.  Your smoking habits.  When you normally have your period.  Whether you are pregnant or may be pregnant. What are the risks? Generally, this is a safe procedure. However, problems may occur, including:  Heavy bleeding for several days or weeks after the procedure.  Allergic reactions to medicines or dyes.  Increased risk of preterm labor in future pregnancies.  Infection (rare).  Damage to the cervix or other structures or organs (rare).  What happens before the procedure? Staying hydrated Follow instructions from your health care provider about hydration, which may include:  Up to 2 hours before the procedure - you may continue to drink clear liquids, such as water, clear fruit juice, black coffee, and plain tea.  Eating and drinking restrictions Follow instructions from your health care provider about eating and drinking, which may include:  8 hours before the procedure - stop eating heavy meals or foods such as meat, fried foods, or fatty foods.  6 hours before the procedure - stop eating  light meals or foods, such as toast or cereal.  6 hours before the procedure - stop drinking milk or drinks that contain milk.  2 hours before the procedure - stop drinking clear liquids.  General instructions  Do not douche, have sex, use tampons, or use any vaginal medicines before the procedure as told by your health care provider.  You may be asked to empty your bladder and bowel right before the procedure.  Ask your health care provider about: ? Changing or stopping your normal medicines. This is important if you take diabetes medicines or blood thinners. ? Taking medicines such as aspirin and ibuprofen. These medicines can thin your blood. Do not take these medicines before your procedure if your doctor tells you not to.  Plan to have someone take you home from the hospital or clinic. What happens during the procedure?  To reduce your risk of infection: ? Your health care team will wash or sanitize their hands. ? Your skin will be washed with soap. ? Hair may be removed from the surgical area.  You will undress from the waist down and be given a gown to wear.  You will lie on an examining table and put your feet in stirrups.  An IV tube will be inserted into one of your veins.  You will be given one or more of the following: ? A medicine to help you relax (sedative). ? A medicine to numb the area (local anesthetic). ? A medicine to make you fall asleep (general anesthetic). ? A medicine that numbs the cervix (cervical block).    A lubricated device called a speculum will be inserted into your vagina. It will be used to spread open the walls of the vagina so your health care provider can see the inside of the vagina and cervix better.  An instrument that has a magnifying lens and a light (colposcope) will let your health care provider examine the cervix more closely.  Your health care provider will apply a solution to your cervix. This turns abnormal areas a pale  color.  A tissue sample will be removed from the cervix using one of the following methods: ? The cold knife method. In this method, the tissue is cut out with a knife (scalpel). ? The loop electrosurgical excision procedure (LEEP) method. In this method, the tissue is cut out with a thin wire that can burn (cauterize) the tissue with an electrical current. ? Laser treatment method. In this method, the tissue is cut out and then cauterized with a laser beam to prevent bleeding.  Your health care provider will apply a paste over the biopsy areas to help control bleeding.  The tissue sample will be examined under a microscope. The procedure may vary among health care providers and hospitals. What happens after the procedure?  Your blood pressure, heart rate, breathing rate, and blood oxygen level will be monitored often until the medicines you were given have worn off.  If you were given a local anesthetic, you will rest at the clinic or hospital until you are stable and feel ready to go home.  If you were given a general anesthetic, you may be monitored for a longer period of time.  You may have some cramping.  You may have bloody discharge or light to moderate bleeding.  You may have dark discharge coming from your vagina. This is from the paste used on the cervix to prevent bleeding. Summary  Cervical conization is a procedure in which a cone-shaped portion of the cervix is cut out so that it can be examined under a microscope.  The procedure is done to check for cancer cells or cells that might turn into cancer (precancerous cells). This information is not intended to replace advice given to you by your health care provider. Make sure you discuss any questions you have with your health care provider. Document Released: 05/27/2005 Document Revised: 08/19/2016 Document Reviewed: 08/19/2016 Elsevier Interactive Patient Education  2017 [employer] Cervical Conization, Care  After This sheet gives you information about how to care for yourself after your procedure. Your doctor may also give you more specific instructions. If you have problems or questions, contact your doctor. Follow these instructions at home: Medicines  Take over-the-counter and prescription medicines only as told by your doctor.  Do not take aspirin until your doctor says it is okay.  If you take pain medicine: ? You may have constipation. To help treat this, your doctor may tell you to:  Drink enough fluid to keep your pee (urine) clear or pale yellow.  Take medicines.  Eat foods that are high in fiber. These include fresh fruits and vegetables, whole grains, bran, and beans.  Limit foods that are high in fat and sugar. These include fried foods and sweet foods. ? Do not drive or use heavy machines. General instructions  You can eat your usual diet unless your doctor tells you not to do so.  Take showers for the first week. Do not take baths, swim, or use hot tubs until your doctor says it is okay.  Do   not douche, use tampons, or have sex until your doctor says it is okay.  For 7-14 days after your procedure, avoid: ? Being very active. ? Exercising. ? Heavy lifting.  Keep all follow-up visits as told by your doctor. This is important. Contact a doctor if:  You have a rash.  You are dizzy or lightheaded.  You feel sick to your stomach (nauseous).  You throw up (vomit).  You have fluid from your vagina (vaginal discharge) that smells bad. Get help right away if:  There are blood clots coming from your vagina.  You have more bleeding than you would have in a normal period. For example, you soak a pad in less than 1 hour.  You have a fever.  You have more and more cramps.  You pass out (faint).  You have pain when peeing.  Your have a lot of pain.  Your pain gets worse.  Your pain does not get better when you take your medicine.  You have blood in your  pee.  You throw up (vomit). Summary  After your procedure, take over-the-counter and prescription medicines only as told by your doctor.  Do not douche, use tampons, or have sex until your doctor says it is okay.  For about 7-14 days after your procedure, try not to exercise or lift heavy objects.  Get help right away if you have new symptoms, or if your symptoms become worse. This information is not intended to replace advice given to you by your health care provider. Make sure you discuss any questions you have with your health care provider. Document Released: 05/26/2008 Document Revised: 08/19/2016 Document Reviewed: 08/19/2016 Elsevier Interactive Patient Education  2017 [employer] Inc.  

## 2018-04-28 NOTE — Progress Notes (Signed)
Patient ID: TERREA BRUSTER, female   DOB: [birth year], 47 y.o.   MRN: [id number] Ms Torbert is here today to discuss rescheduling her CKC. Case was canceled last week d/t to uncontrolled diabetes.  She saw her PCP yesterday. HgbA1C has improve from 15 to 11. Metformin has been added to her treatment  Unfortunately pt is frustrated with surgery being canceled and trying to arrange follow up. She is under a lot of stress d/t to this, 2 jobs and mother recently moving in with her.  PE AF VSS Lungs clear Heart RRR Abd soft + BS  A/P Moderate/severe cervical dysplasia        Depression/Anxiety  I apologized to Ms Urbanek for the cancellation of her surgery and follow up communication. She verbalized understanding and appreciated my explanation.   I spoke to Dr Richardson Landry, anesthesia, and feels that she should be OK for surgery with improvement in her diabetes even through no optimum control yet.   Pt will be scheduled for surgery next Wednesday Ativan PRN for depression and anxiety for 14 days only, no refills. Pt made aware that any additional treatment would need to be managed by her PCP. Pt saw Asher Muir today as well F/U with post op appt

## 2018-04-28 NOTE — BH Specialist Note (Signed)
Integrated Behavioral Health Initial Visit  MRN: [id number] Name: [name]  Number of Integrated Behavioral Health Clinician visits:: 1/6 Session Start time: 2:12  Session End time: 2:39 Total time: 30 minutes  Type of Service: Integrated Behavioral Health- Individual/Family Interpretor:No. Interpretor Name and Language: n/a   Warm Hand Off Completed.       SUBJECTIVE: [name] is a 47 y.o. female accompanied by n/a Patient was referred by Nettie Elm, MD for symptoms of depression. Patient reports the following symptoms/concerns: Pt states her primary concern today is feeling overwhelmed this past week with an increase in life stress, including back-to-school for her 49yo non-verbal autistic son, change in care providers for her son, 61yo mother moving in with her, and changes in surgery schedule. Pt is very open to learning self-coping strategies today, to use alongside medication.  Duration of problem: Increase in over one week; Severity of problem: moderately severe .phq OBJECTIVE: Mood: Anxious and Affect: Tearful Risk of harm to self or others: No plan to harm self or others  LIFE CONTEXT: Family and Social: Pt lives with her 19yo son and 80yo mother School/Work: - Self-Care: Recognizing a greater need for self care Life Changes: Change in care provider/school year for son, mother moved in; upcoming surgery  GOALS ADDRESSED: Patient will: 1. Reduce symptoms of: anxiety, depression and stress 2. Increase knowledge and/or ability of: self-management skills and stress reduction  3. Demonstrate ability to: Increase healthy adjustment to current life circumstances  INTERVENTIONS: Interventions utilized: Mindfulness or Management consultant and Psychoeducation and/or Health Education  Standardized Assessments completed: GAD-7 and PHQ 9  ASSESSMENT: Patient currently experiencing Acute stress reaction   Patient may benefit from psychoeducation and brief  therapeutic interventions regarding coping with symptoms of anxiety and depression .  PLAN: 1. Follow up with behavioral health clinician on : As needed 2. Behavioral recommendations:  -CALM relaxation breathing exercise twice daily(w morning coffee and at bedtime with sleep sounds) -Read educational materials regarding coping with symptoms of anxiety and depression  3. Referral(s): Integrated Behavioral Health Services (In Clinic) 4. "From scale of 1-10, how likely are you to follow plan?": 10  Rae Lips, LCSW  Depression screen [facility] 2/9 04/28/2018 04/26/2018 02/17/2018 12/30/2017 12/10/2017  Decreased Interest 2 1 0 2 2  Down, Depressed, Hopeless 2 1 0 2 2  PHQ - 2 Score 4 2 0 4 4  Altered sleeping 1 2 0 1 2  Tired, decreased energy 2 1 0 3 3  Change in appetite 2 2 0 2 3  Feeling bad or failure about yourself  1 1 0 1 2  Trouble concentrating 2 2 0 2 2  Moving slowly or fidgety/restless 0 0 0 0 1  Suicidal thoughts 0 0 0 0 0  PHQ-9 Score 12 10 0 13 17   GAD 7 : Generalized Anxiety Score 04/28/2018 04/26/2018 02/17/2018 12/30/2017  Nervous, Anxious, on Edge 3 3 0 1  Control/stop worrying 3 3 0 2  Worry too much - different things 3 3 0 1  Trouble relaxing 3 3 0 3  Restless 2 2 0 2  Easily annoyed or irritable 3 3 0 2  Afraid - awful might happen 0 1 0 0  Total GAD 7 Score 17 18 0 11

## 2018-04-29 ENCOUNTER — Encounter (HOSPITAL_BASED_OUTPATIENT_CLINIC_OR_DEPARTMENT_OTHER): Payer: Self-pay

## 2018-04-29 ENCOUNTER — Other Ambulatory Visit: Payer: Self-pay

## 2018-04-29 NOTE — Progress Notes (Signed)
Chart reviewed by Dr Desmond Lope, aware of last blood sugar and HA1C. Will recheck BMET morning before surgery. Patient aware and agreeable to plan. Dr Ilene Qua office also aware.

## 2018-05-03 ENCOUNTER — Encounter (HOSPITAL_BASED_OUTPATIENT_CLINIC_OR_DEPARTMENT_OTHER)
Admission: RE | Admit: 2018-05-03 | Discharge: 2018-05-03 | Disposition: A | Discharge status: Home / Self Care | Payer: Medicaid Other | Source: Ambulatory Visit | Attending: Obstetrics and Gynecology | Admitting: Obstetrics and Gynecology

## 2018-05-03 DIAGNOSIS — Z01818 Encounter for other preprocedural examination: Secondary | ICD-10-CM | POA: Insufficient documentation

## 2018-05-03 LAB — BASIC METABOLIC PANEL
Anion gap: 9 (ref 5–15)
BUN: 28 mg/dL — ABNORMAL HIGH (ref 6–20)
CO2: 23 mmol/L (ref 22–32)
Calcium: 9 mg/dL (ref 8.9–10.3)
Chloride: 104 mmol/L (ref 98–111)
Creatinine, Ser: 1.22 mg/dL — ABNORMAL HIGH (ref 0.44–1.00)
GFR calc Af Amer: 59 mL/min — ABNORMAL LOW (ref >60)
GFR calc non Af Amer: 51 mL/min — ABNORMAL LOW (ref >60)
Glucose, Bld: 271 mg/dL — ABNORMAL HIGH (ref 70–99)
Potassium: 4.7 mmol/L (ref 3.5–5.1)
Sodium: 136 mmol/L (ref 135–145)

## 2018-05-03 MED FILL — $LEVEMIR FLEXTOUCH 100 UNIT: 100 | 18 days supply | Qty: 15 | Fill #1

## 2018-05-03 NOTE — Progress Notes (Signed)
Lab results reviewed by Dr. Hyacinth Meeker ([id number]), pt notified and left message for Rhea Bleacher, will reevaluate CBG on day of surgery, will proceed with surgery as planned.

## 2018-05-04 ENCOUNTER — Encounter (HOSPITAL_BASED_OUTPATIENT_CLINIC_OR_DEPARTMENT_OTHER): Payer: Self-pay

## 2018-05-04 ENCOUNTER — Ambulatory Visit (HOSPITAL_BASED_OUTPATIENT_CLINIC_OR_DEPARTMENT_OTHER): Payer: Self-pay | Admitting: Anesthesiology

## 2018-05-04 ENCOUNTER — Encounter (HOSPITAL_BASED_OUTPATIENT_CLINIC_OR_DEPARTMENT_OTHER)
Admission: RE | Disposition: A | Discharge status: Home / Self Care | Payer: Self-pay | Source: Ambulatory Visit | Attending: Obstetrics and Gynecology

## 2018-05-04 ENCOUNTER — Ambulatory Visit (HOSPITAL_BASED_OUTPATIENT_CLINIC_OR_DEPARTMENT_OTHER)
Admission: RE | Admit: 2018-05-04 | Discharge: 2018-05-04 | Disposition: A | Discharge status: Home / Self Care | Payer: Self-pay | Source: Ambulatory Visit | Attending: Obstetrics and Gynecology | Admitting: Obstetrics and Gynecology

## 2018-05-04 ENCOUNTER — Other Ambulatory Visit: Payer: Self-pay

## 2018-05-04 DIAGNOSIS — N871 Moderate cervical dysplasia: Secondary | ICD-10-CM

## 2018-05-04 DIAGNOSIS — M19049 Primary osteoarthritis, unspecified hand: Secondary | ICD-10-CM | POA: Insufficient documentation

## 2018-05-04 DIAGNOSIS — Z79899 Other long term (current) drug therapy: Secondary | ICD-10-CM | POA: Insufficient documentation

## 2018-05-04 DIAGNOSIS — Z882 Allergy status to sulfonamides status: Secondary | ICD-10-CM | POA: Insufficient documentation

## 2018-05-04 DIAGNOSIS — Z6831 Body mass index (BMI) 31.0-31.9, adult: Secondary | ICD-10-CM | POA: Insufficient documentation

## 2018-05-04 DIAGNOSIS — D069 Carcinoma in situ of cervix, unspecified: Secondary | ICD-10-CM | POA: Insufficient documentation

## 2018-05-04 DIAGNOSIS — Z8249 Family history of ischemic heart disease and other diseases of the circulatory system: Secondary | ICD-10-CM | POA: Insufficient documentation

## 2018-05-04 DIAGNOSIS — Z794 Long term (current) use of insulin: Secondary | ICD-10-CM | POA: Insufficient documentation

## 2018-05-04 DIAGNOSIS — I10 Essential (primary) hypertension: Secondary | ICD-10-CM | POA: Insufficient documentation

## 2018-05-04 DIAGNOSIS — G4730 Sleep apnea, unspecified: Secondary | ICD-10-CM | POA: Insufficient documentation

## 2018-05-04 DIAGNOSIS — E669 Obesity, unspecified: Secondary | ICD-10-CM | POA: Insufficient documentation

## 2018-05-04 DIAGNOSIS — F329 Major depressive disorder, single episode, unspecified: Secondary | ICD-10-CM | POA: Insufficient documentation

## 2018-05-04 DIAGNOSIS — Z885 Allergy status to narcotic agent status: Secondary | ICD-10-CM | POA: Insufficient documentation

## 2018-05-04 DIAGNOSIS — K219 Gastro-esophageal reflux disease without esophagitis: Secondary | ICD-10-CM | POA: Insufficient documentation

## 2018-05-04 DIAGNOSIS — E119 Type 2 diabetes mellitus without complications: Secondary | ICD-10-CM | POA: Insufficient documentation

## 2018-05-04 HISTORY — PX: CERVICAL CONIZATION W/BX: SHX1330

## 2018-05-04 LAB — GLUCOSE, CAPILLARY
Glucose-Capillary: 137 mg/dL — ABNORMAL HIGH (ref 70–99)
Glucose-Capillary: 114 mg/dL — ABNORMAL HIGH (ref 70–99)

## 2018-05-04 SURGERY — CONE BIOPSY, CERVIX
Anesthesia: Monitor Anesthesia Care

## 2018-05-04 MED ORDER — HYDROCODONE-ACETAMINOPHEN 5-325 MG PO TABS
ORAL_TABLET | ORAL | Status: AC
  Filled 2018-05-04: qty 1

## 2018-05-04 MED ORDER — FENTANYL CITRATE (PF) 100 MCG/2ML IJ SOLN
INTRAMUSCULAR | Status: AC
  Filled 2018-05-04: qty 2

## 2018-05-04 MED ORDER — MIDAZOLAM HCL 2 MG/2ML IJ SOLN
1.0000 mg | INTRAMUSCULAR | Status: DC | PRN
  Administered 2018-05-04: 2 mg via INTRAVENOUS

## 2018-05-04 MED ORDER — ROCURONIUM BROMIDE 50 MG/5ML IV SOSY
PREFILLED_SYRINGE | INTRAVENOUS | Status: AC
  Filled 2018-05-04: qty 5

## 2018-05-04 MED ORDER — PROPOFOL 500 MG/50ML IV EMUL
INTRAVENOUS | Status: DC | PRN
  Administered 2018-05-04: 150 ug/kg/min via INTRAVENOUS

## 2018-05-04 MED ORDER — LIDOCAINE 2% (20 MG/ML) 5 ML SYRINGE
INTRAMUSCULAR | Status: AC
  Filled 2018-05-04: qty 5

## 2018-05-04 MED ORDER — HYDROCODONE-ACETAMINOPHEN 5-325 MG PO TABS: 1.0000 | ORAL_TABLET | Freq: Four times a day (QID) | ORAL | 0 refills | Status: DC | PRN

## 2018-05-04 MED ORDER — PROPOFOL 10 MG/ML IV BOLUS
INTRAVENOUS | Status: AC
  Filled 2018-05-04: qty 20

## 2018-05-04 MED ORDER — IODINE STRONG (LUGOLS) 5 % PO SOLN
ORAL | Status: DC | PRN
  Administered 2018-05-04: 0.1 mL

## 2018-05-04 MED ORDER — FERRIC SUBSULFATE 259 MG/GM EX SOLN
CUTANEOUS | Status: AC
  Filled 2018-05-04: qty 8

## 2018-05-04 MED ORDER — FENTANYL CITRATE (PF) 100 MCG/2ML IJ SOLN: 25.0000 ug | INTRAMUSCULAR | Status: DC | PRN

## 2018-05-04 MED ORDER — DEXAMETHASONE SODIUM PHOSPHATE 10 MG/ML IJ SOLN
INTRAMUSCULAR | Status: AC
  Filled 2018-05-04: qty 1

## 2018-05-04 MED ORDER — ONDANSETRON HCL 4 MG/2ML IJ SOLN
INTRAMUSCULAR | Status: DC | PRN
  Administered 2018-05-04: 4 mg via INTRAVENOUS

## 2018-05-04 MED ORDER — LACTATED RINGERS IV SOLN: INTRAVENOUS | Status: DC

## 2018-05-04 MED ORDER — KETOROLAC TROMETHAMINE 30 MG/ML IJ SOLN
INTRAMUSCULAR | Status: DC | PRN
  Administered 2018-05-04: 30 mg via INTRAVENOUS

## 2018-05-04 MED ORDER — IODINE STRONG (LUGOLS) 5 % PO SOLN
ORAL | Status: AC
  Filled 2018-05-04: qty 1

## 2018-05-04 MED ORDER — SCOPOLAMINE 1 MG/3DAYS TD PT72: 1.0000 | MEDICATED_PATCH | Freq: Once | TRANSDERMAL | Status: DC | PRN

## 2018-05-04 MED ORDER — HYDROCODONE-ACETAMINOPHEN 5-325 MG PO TABS
1.0000 | ORAL_TABLET | Freq: Once | ORAL | Status: AC
  Administered 2018-05-04: 1 via ORAL

## 2018-05-04 MED ORDER — LACTATED RINGERS IV SOLN
INTRAVENOUS | Status: DC
  Administered 2018-05-04 (×2): via INTRAVENOUS

## 2018-05-04 MED ORDER — MIDAZOLAM HCL 2 MG/2ML IJ SOLN
INTRAMUSCULAR | Status: AC
  Filled 2018-05-04: qty 2

## 2018-05-04 MED ORDER — ONDANSETRON HCL 4 MG/2ML IJ SOLN: 4.0000 mg | Freq: Once | INTRAMUSCULAR | Status: DC | PRN

## 2018-05-04 MED ORDER — SOD CITRATE-CITRIC ACID 500-334 MG/5ML PO SOLN: 30.0000 mL | ORAL | Status: DC

## 2018-05-04 MED ORDER — FENTANYL CITRATE (PF) 100 MCG/2ML IJ SOLN
50.0000 ug | INTRAMUSCULAR | Status: AC | PRN
  Administered 2018-05-04 (×2): 25 ug via INTRAVENOUS
  Administered 2018-05-04: 50 ug via INTRAVENOUS

## 2018-05-04 SURGICAL SUPPLY — 29 items
APL SWBSTK 6 STRL LF DISP (MISCELLANEOUS) ×1
APPLICATOR COTTON TIP 6 STRL (MISCELLANEOUS) ×1 IMPLANT
APPLICATOR COTTON TIP 6IN STRL (MISCELLANEOUS) ×2
BLADE 11 SAFETY STRL DISP (BLADE) IMPLANT
BRIEF STRETCH FOR OB PAD XXL (UNDERPADS AND DIAPERS) ×2 IMPLANT
CATH ROBINSON RED A/P 16FR (CATHETERS) IMPLANT
CLOTH BEACON ORANGE TIMEOUT ST (SAFETY) ×2 IMPLANT
ELECT BALL LEEP 5MM RED (ELECTRODE) ×2 IMPLANT
ELECT REM PT RETURN 9FT ADLT (ELECTROSURGICAL) ×2
ELECTRODE REM PT RTRN 9FT ADLT (ELECTROSURGICAL) ×1 IMPLANT
GLOVE BIO SURGEON STRL SZ7.5 (GLOVE) ×2 IMPLANT
GLOVE BIOGEL PI IND STRL 7.0 (GLOVE) ×1 IMPLANT
GLOVE BIOGEL PI INDICATOR 7.0 (GLOVE) ×1
GOWN STRL REUS W/TWL LRG LVL3 (GOWN DISPOSABLE) ×2 IMPLANT
GOWN STRL REUS W/TWL XL LVL3 (GOWN DISPOSABLE) ×2 IMPLANT
HOSE NS SMOKE EVAC 7/8 X6 (MISCELLANEOUS) ×1 IMPLANT
NS IRRIG 1000ML POUR BTL (IV SOLUTION) ×2 IMPLANT
PACK VAGINAL MINOR WOMEN LF (CUSTOM PROCEDURE TRAY) ×2 IMPLANT
PAD OB MATERNITY 4.3X12.25 (PERSONAL CARE ITEMS) ×2 IMPLANT
PAD PREP 24X48 CUFFED NSTRL (MISCELLANEOUS) ×2 IMPLANT
PENCIL BUTTON HOLSTER BLD 10FT (ELECTRODE) ×2 IMPLANT
REDUCER FITTING SMOKE EVAC (MISCELLANEOUS) ×1 IMPLANT
SCOPETTES 8  STERILE (MISCELLANEOUS) ×1
SCOPETTES 8 STERILE (MISCELLANEOUS) ×1 IMPLANT
SUT VIC AB 1 CT1 27 (SUTURE) ×2
SUT VIC AB 1 CT1 27XBRD ANBCTR (SUTURE) IMPLANT
SUT VIC AB PLUS 45CM 1-MO-4 (SUTURE) ×2 IMPLANT
TOWEL GREEN STERILE FF (TOWEL DISPOSABLE) ×2 IMPLANT
TUBING SMOKE EVAC HOSE ADAPTER (MISCELLANEOUS) ×1 IMPLANT

## 2018-05-04 NOTE — Transfer of Care (Signed)
Immediate Anesthesia Transfer of Care Note  Patient: [name]  Procedure(s) Performed: CONIZATION CERVIX WITH BIOPSY - COLD KNIFE (N/A )  Patient Location: PACU  Anesthesia Type:MAC  Level of Consciousness: awake, alert  and oriented  Airway & Oxygen Therapy: Patient Spontanous Breathing and Patient connected to face mask oxygen  Post-op Assessment: Report given to RN and Post -op Vital signs reviewed and stable  Post vital signs: Reviewed and stable  Last Vitals:  Vitals Value Taken Time  BP 107/71 05/04/2018 10:20 AM  Temp    Pulse 72 05/04/2018 10:21 AM  Resp 13 05/04/2018 10:21 AM  SpO2 97 % 05/04/2018 10:21 AM  Vitals shown include unvalidated device data.  Last Pain:  Vitals:   05/04/18 0917  TempSrc: Oral  PainSc: 0-No pain         Complications: No apparent anesthesia complications

## 2018-05-04 NOTE — Anesthesia Postprocedure Evaluation (Signed)
Anesthesia Post Note  Patient: MILLISSA DEESE  Procedure(s) Performed: CONIZATION CERVIX WITH BIOPSY - COLD KNIFE (N/A )     Patient location during evaluation: PACU Anesthesia Type: MAC Level of consciousness: awake and alert Pain management: pain level controlled Vital Signs Assessment: post-procedure vital signs reviewed and stable Respiratory status: spontaneous breathing, nonlabored ventilation, respiratory function stable and patient connected to nasal cannula oxygen Cardiovascular status: stable and blood pressure returned to baseline Postop Assessment: no apparent nausea or vomiting Anesthetic complications: no    Last Vitals:  Vitals:   05/04/18 1045 05/04/18 1116  BP: 140/83 138/88  Pulse: 73 78  Resp: 15 20  Temp:  36.6 C  SpO2: 99% 99%    Last Pain:  Vitals:   05/04/18 1116  TempSrc: Oral  PainSc: 4                  Ryan P Ellender

## 2018-05-04 NOTE — Anesthesia Preprocedure Evaluation (Addendum)
Anesthesia Evaluation  Patient identified by MRN, date of birth, ID band Patient awake    Reviewed: Allergy & Precautions, NPO status , Patient's Chart, lab work & pertinent test results  History of Anesthesia Complications (+) PONV and history of anesthetic complications  Airway Mallampati: II  TM Distance: >3 FB Neck ROM: Full    Dental no notable dental hx.    Pulmonary sleep apnea , Current Smoker,    Pulmonary exam normal breath sounds clear to auscultation       Cardiovascular hypertension, Normal cardiovascular exam Rhythm:Regular Rate:Normal  ECG: SR, rate 76   Neuro/Psych PSYCHIATRIC DISORDERS Depression negative neurological ROS     GI/Hepatic Neg liver ROS, GERD  Medicated and Controlled,  Endo/Other  diabetes, Insulin Dependent, Oral Hypoglycemic Agents  Renal/GU negative Renal ROS     Musculoskeletal negative musculoskeletal ROS (+)   Abdominal (+) + obese,   Peds  Hematology negative hematology ROS (+)   Anesthesia Other Findings High Grade Cervical Dysplasia   Reproductive/Obstetrics hcg negative (04/18/18)                            Anesthesia Physical Anesthesia Plan  ASA: III  Anesthesia Plan: MAC   Post-op Pain Management:    Induction: Intravenous  PONV Risk Score and Plan: 3 and Ondansetron, Midazolam, Treatment may vary due to age or medical condition and Propofol infusion  Airway Management Planned: Simple Face Mask  Additional Equipment:   Intra-op Plan:   Post-operative Plan: Extubation in OR  Informed Consent: I have reviewed the patients History and Physical, chart, labs and discussed the procedure including the risks, benefits and alternatives for the proposed anesthesia with the patient or authorized representative who has indicated his/her understanding and acceptance.   Dental advisory given  Plan Discussed with: CRNA  Anesthesia Plan  Comments: (Repeat pregnancy test offered to and declined by patient )      Anesthesia Quick Evaluation

## 2018-05-04 NOTE — Discharge Instructions (Signed)
°Post Anesthesia Home Care Instructions ° °Activity: °Get plenty of rest for the remainder of the day. A responsible individual must stay with you for 24 hours following the procedure.  °For the next 24 hours, DO NOT: °-Drive a car °-Operate machinery °-Drink alcoholic beverages °-Take any medication unless instructed by your physician °-Make any legal decisions or sign important papers. ° °Meals: °Start with liquid foods such as gelatin or soup. Progress to regular foods as tolerated. Avoid greasy, spicy, heavy foods. If nausea and/or vomiting occur, drink only clear liquids until the nausea and/or vomiting subsides. Call your physician if vomiting continues. ° °Special Instructions/Symptoms: °Your throat may feel dry or sore from the anesthesia or the breathing tube placed in your throat during surgery. If this causes discomfort, gargle with warm salt water. The discomfort should disappear within 24 hours. ° °If you had a scopolamine patch placed behind your ear for the management of post- operative nausea and/or vomiting: ° °1. The medication in the patch is effective for 72 hours, after which it should be removed.  Wrap patch in a tissue and discard in the trash. Wash hands thoroughly with soap and water. °2. You may remove the patch earlier than 72 hours if you experience unpleasant side effects which may include dry mouth, dizziness or visual disturbances. °3. Avoid touching the patch. Wash your hands with soap and water after contact with the patch. °  °Cervical Conization, Care After °This sheet gives you information about how to care for yourself after your procedure. Your doctor may also give you more specific instructions. If you have problems or questions, contact your doctor. °Follow these instructions at home: °Medicines °· Take over-the-counter and prescription medicines only as told by your doctor. °· Do not take aspirin until your doctor says it is okay. °· If you take pain medicine: °? You may  have constipation. To help treat this, your doctor may tell you to: °§ Drink enough fluid to keep your pee (urine) clear or pale yellow. °§ Take medicines. °§ Eat foods that are high in fiber. These include fresh fruits and vegetables, whole grains, bran, and beans. °§ Limit foods that are high in fat and sugar. These include fried foods and sweet foods. °? Do not drive or use heavy machines. °General instructions °· You can eat your usual diet unless your doctor tells you not to do so. °· Take showers for the first week. Do not take baths, swim, or use hot tubs until your doctor says it is okay. °· Do not douche, use tampons, or have sex until your doctor says it is okay. °· For 7-14 days after your procedure, avoid: °? Being very active. °? Exercising. °? Heavy lifting. °· Keep all follow-up visits as told by your doctor. This is important. °Contact a doctor if: °· You have a rash. °· You are dizzy or lightheaded. °· You feel sick to your stomach (nauseous). °· You throw up (vomit). °· You have fluid from your vagina (vaginal discharge) that smells bad. °Get help right away if: °· There are blood clots coming from your vagina. °· You have more bleeding than you would have in a normal period. For example, you soak a pad in less than 1 hour. °· You have a fever. °· You have more and more cramps. °· You pass out (faint). °· You have pain when peeing. °· Your have a lot of pain. °· Your pain gets worse. °· Your pain does not get better when   you take your medicine. °· You have blood in your pee. °· You throw up (vomit). °Summary °· After your procedure, take over-the-counter and prescription medicines only as told by your doctor. °· Do not douche, use tampons, or have sex until your doctor says it is okay. °· For about 7-14 days after your procedure, try not to exercise or lift heavy objects. °· Get help right away if you have new symptoms, or if your symptoms become worse. °This information is not intended to replace  advice given to you by your health care provider. Make sure you discuss any questions you have with your health care provider. °Document Released: 05/26/2008 Document Revised: 08/19/2016 Document Reviewed: 08/19/2016 °Elsevier Interactive Patient Education © 2017 Elsevier Inc. ° °

## 2018-05-04 NOTE — H&P (Signed)
[name] is an 47 y.o. female severe cervical dysplasia proven by colposcopy Bx. Further evaluation and treatment with CKC has been reccommended to pt.    Menstrual History: Menarche age: 67 No LMP recorded. Patient has had an ablation.    Past Medical History:  Diagnosis Date  . Arthritis    fingers  . Cervical dysplasia   . Depression   . Diabetes mellitus   . Dyspnea 11/22/2013  . GERD (gastroesophageal reflux disease)   . Hypertension    no meds now  . Kidney stones   . PONV (postoperative nausea and vomiting)   . Sleep apnea    lost 70lbs no cpap x5yrs now    Past Surgical History:  Procedure Laterality Date  . CARDIAC CATHETERIZATION     4-28yrs ago  . CHOLECYSTECTOMY    . ENDOMETRIAL ABLATION     6 years ago  . IRRIGATION AND DEBRIDEMENT ABSCESS Right 03/17/2017   Procedure: IRRIGATION AND DEBRIDEMENT RIGHT THIGH ABSCESS;  Surgeon: Karie Soda, MD;  Location: [facility];  Service: General;  Laterality: Right;  . LAPAROSCOPIC APPENDECTOMY  10/04/2011   Procedure: APPENDECTOMY LAPAROSCOPIC;  Surgeon: Rulon Abide, DO;  Location: [facility];  Service: General;  Laterality: N/A;    Family History  Problem Relation Age of Onset  . Aneurysm Mother   . Heart attack Father   . Stroke Father   . Breast cancer Paternal Grandmother     Social History:  reports that she has been smoking e-cigarettes. She has a 24.00 pack-year smoking history. She has never used smokeless tobacco. She reports that she drinks alcohol. She reports that she does not use drugs.  Allergies:  Allergies  Allergen Reactions  . Codeine Itching  . Sulfa Antibiotics Hives    No medications prior to admission.    Review of Systems  Constitutional: Negative.   Respiratory: Negative.   Cardiovascular: Negative.   Gastrointestinal: Negative.   Genitourinary: Negative.     Height 5\' 9"  (1.753 m), weight 96.6 kg. Physical Exam  Constitutional: She appears well-developed and  well-nourished.  Cardiovascular: Normal rate and regular rhythm.  Respiratory: Effort normal and breath sounds normal.  GI: Soft. Bowel sounds are normal.  Genitourinary:  Genitourinary Comments: Deferred to OR    Results for orders placed or performed during the hospital encounter of 05/04/18 (from the past 24 hour(s))  Basic metabolic panel     Status: Abnormal   Collection Time: 05/03/18  9:42 AM  Result Value Ref Range   Sodium 136 135 - 145 mmol/L   Potassium 4.7 3.5 - 5.1 mmol/L   Chloride 104 98 - 111 mmol/L   CO2 23 22 - 32 mmol/L   Glucose, Bld 271 (H) 70 - 99 mg/dL   BUN 28 (H) 6 - 20 mg/dL   Creatinine, Ser 07/03/18 (H) 0.44 - 1.00 mg/dL   Calcium 9.0 8.9 - 9.16 mg/dL   GFR calc non Af Amer 51 (L) >60 mL/min   GFR calc Af Amer 59 (L) >60 mL/min   Anion gap 9 5 - 15    No results found.  Assessment/Plan: Severe Cervical dysplasia  CKC has been reviewed with pt. R/B/post op care have been reviewed. Pt has verbalized understanding and agrees to proceed.    38.4 05/04/2018, 8:21 AM

## 2018-05-04 NOTE — Op Note (Signed)
Preoperative diagnosis: Severe Cervical Dysplisa  Postoperative diagnosis: Same  Procedure: Cervical conization  Surgeon: Nettie Elm, M.D.  Anesthesia: LMA - Ellender, Catheryn Bacon, MD  Findings: Area of abnormal uptake after application of Lugol's solution at th 7 o'clock position  Estimated blood loss: 30 cc  Specimens: Cervical conization  Reason for procedure: CAMDYNN MARANTO [id number] with h/o colposcopy Bx proven severe cervical dysplsia  Procedure: Patient was taken to the operating room where  analgesia was administered.She was prepped and draped in the usual sterile fashion. A timeout was performed. The patient had SCDs in place. The patient was in dorsal lithotomy. Exam noted nl EGBUS, uterus was small and mobile.  A weighted speculum was placed inside the vagina.  A Deaver was used anteriorly. The cervix was grasped with a single tooth tenaculum. A 0 Vicryl suture on a CT-1 was used to put stay sutures in cervix from 10-8 and from 2-4 o'clock  A Beaver blade was used to make stab incisins at the 12,3,6 and 9 o'clock positions. These incisions were then connected. The cervical conization sample was grasped and removed with Mayo.  An ECC was then performed. Specimens were past off for pathology. The conization site and ectocervix were cauterized with the Bovie.  The site was then closed with 0 Vicryl in a Strodoff fashion. Hemostasis was noted. Endocervical patency was verified with passage of uterine sound.  All instrument, needle and lap counts were correct x 2. The patient was taken to recovery in stable condition.  Cabot Cromartie L. Alysia Penna, MD

## 2018-05-05 ENCOUNTER — Encounter (HOSPITAL_BASED_OUTPATIENT_CLINIC_OR_DEPARTMENT_OTHER): Payer: Self-pay | Admitting: Obstetrics and Gynecology

## 2018-05-06 ENCOUNTER — Telehealth: Payer: Self-pay | Admitting: Clinical

## 2018-05-06 NOTE — Telephone Encounter (Signed)
Follow up call, as agreed-upon by pt; Pt is feeling much better after having her surgery, appreciates the check-in, and is aware that she has an upcoming appointment at [facility] on 06-06-18.

## 2018-05-11 ENCOUNTER — Ambulatory Visit: Payer: Self-pay | Attending: Internal Medicine | Admitting: Pharmacist

## 2018-05-11 DIAGNOSIS — IMO0001 Reserved for inherently not codable concepts without codable children: Secondary | ICD-10-CM

## 2018-05-11 DIAGNOSIS — Z8249 Family history of ischemic heart disease and other diseases of the circulatory system: Secondary | ICD-10-CM | POA: Insufficient documentation

## 2018-05-11 DIAGNOSIS — E1165 Type 2 diabetes mellitus with hyperglycemia: Secondary | ICD-10-CM

## 2018-05-11 DIAGNOSIS — Z7984 Long term (current) use of oral hypoglycemic drugs: Secondary | ICD-10-CM | POA: Insufficient documentation

## 2018-05-11 DIAGNOSIS — Z823 Family history of stroke: Secondary | ICD-10-CM | POA: Insufficient documentation

## 2018-05-11 DIAGNOSIS — E119 Type 2 diabetes mellitus without complications: Secondary | ICD-10-CM | POA: Insufficient documentation

## 2018-05-11 DIAGNOSIS — F17290 Nicotine dependence, other tobacco product, uncomplicated: Secondary | ICD-10-CM | POA: Insufficient documentation

## 2018-05-11 MED ORDER — METFORMIN HCL ER 500 MG PO TB24: ORAL_TABLET | ORAL | 2 refills | Status: DC

## 2018-05-11 MED FILL — $TRULICITY 0.75 MG/0.5 ML P: 0.75 | 28 days supply | Qty: 1 | Fill #0

## 2018-05-11 MED FILL — ?METFORMIN HCL ER 500 MG TA: 500 | 30 days supply | Qty: 120 | Fill #0

## 2018-05-11 MED FILL — FLUoxetine HCL 40 MG CAPS: 40 | 30 days supply | Qty: 30 | Fill #1

## 2018-05-11 NOTE — Progress Notes (Signed)
   S:    PCP: Dr. Laural Benes No chief complaint on file.  Patient arrives in good spirits.  Presents for diabetes management at the request of Dr. Laural Benes (PCP). Patient was referred on 12/30/17 and last seen by PCP 04/26/18.   1. DM Patient reports Diabetes was diagnosed 14-15 years ago.  Patient denies adherence with medications. Current diabetes medications include:  - Levemir 40 units BID - Trulicity 0.75mg  once weekly. Forgot 2 weeks of doses in August.    - Metformin 500 mg BID  Patient denies hypoglycemia.   Patient reported dietary habits:  - Pt reports eating 3 meals a day.  - Including more fruits, vegetables, lean protein, and whole grains.  - States that she limits carbohydrates but "has a slip every now and then"  Patient reported exercise habits:  -Patient reports walking for 30 minutes/day every other day   Patient denies nocturia.  Patient denies any new neuropathy. Patient denies visual changes. Patient reports self foot exams.   Pertinent family/social History:  Father - MI, stroke Tobacco use: nicotine through the use of vape/E-cig  O:  POCT Glucose: 353  Lab Results  Component Value Date   HGBA1C 11.2 (H) 04/26/2018   There were no vitals filed for this visit.  Home CBG levels: Does not bring in log. Reports 100s-200s throughout the day. Reports nothing > 300 at home.   A/P: Diabetes longstanding currently uncontrolled based on A1c and home glucose levels. Patient denies hypoglycemic events and is able to verbalize appropriate hypoglycemia management plan. Patient reports adherence with medication. Control is suboptimal due to dietary indiscretion. She does report some GI discomfort with metformin and Trulicity that are improving. She is amenable to continuing Trulicity. Will switch her to 500 mg XR of metformin to aid in GI side effect resolution.   Of note, BMP from hospital encounter 05/03/18 reveals GFR 51. Patient can continue metformin for now but we  will need to continue to monitor this.    -Continue Levemir 40 units BID -Continue Trulicity 0.75 mg weekly -Increase/change metformin to 500 mg XR 2 tablets BID  Written patient instructions provided.  Total time in face to face counseling 30 minutes.   Follow up with PCP on 02/17/18.   Patient seen with:  Mosie Lukes, PharmD Candidate [employer] of 2021  Cloyde Reams, PharmD Clinical Pharmacist  [facility]  [phone]

## 2018-05-11 NOTE — Patient Instructions (Signed)
Thank you for coming to see me today. Please do the following:  1. We're changing metformin to metformin XR 500 mg. Take 2 tablets in the morning with breakfast and 2 tablets in the evening with dinner.  2. Continue Trulicity 0.75 mg weekly. 3. Continue Levemir 40 units 2 times a day. 4. Continue checking blood sugars at home.  5. Continue making the lifestyle changes we've discussed together during our visit. Diet and exercise play a significant role in improving your blood sugars.  6. Follow-up with me in 1 month.   Hypoglycemia or low blood sugar:   Low blood sugar can happen quickly and may become an emergency if not treated right away.   While this shouldn't happen often, it can be brought upon if you skip a meal or do not eat enough. Also, if your insulin or other diabetes medications are dosed too high, this can cause your blood sugar to go to low.   Warning signs of low blood sugar include: 1. Feeling shaky or dizzy 2. Feeling weak or tired  3. Excessive hunger 4. Feeling anxious or upset  5. Sweating even when you aren't exercising  What to do if I experience low blood sugar? 1. Check your blood sugar with your meter. If lower than 70, proceed to step 2.  2. Treat with 3-4 glucose tablets or 3 packets of regular sugar. If these aren't around, you can try hard candy. Yet another option would be to drink 4 ounces of fruit juice or 6 ounces of REGULAR soda.  3. Re-check your sugar in 15 minutes. If it is still below 70, do what you did in step 2 again. If has come back up, go ahead and eat a snack or small meal at this time.

## 2018-05-12 ENCOUNTER — Encounter: Payer: Self-pay | Admitting: Pharmacist

## 2018-05-13 ENCOUNTER — Telehealth: Payer: Self-pay | Admitting: Licensed Clinical Social Worker

## 2018-05-13 NOTE — Telephone Encounter (Signed)
LCSWA placed call to pt. LCSWA introduced self and explained role at [facility]. LCSWA disclosed that she received a consult from Dr. Laural Benes to address behavioral health.  Pt agreed to schedule appointment with LCSWA on 06/10/18. No additional concerns noted.

## 2018-05-16 MED FILL — GABAPENTIN 300 MG CAPSULE: 300 | 30 days supply | Qty: 180 | Fill #4

## 2018-05-19 ENCOUNTER — Telehealth: Payer: Self-pay | Admitting: *Deleted

## 2018-05-19 NOTE — Telephone Encounter (Signed)
Wyatt Mage called today and left a voice message she had a conization on 05/04/18 and had cramping and bleeding but by 13th felt fine and no bleeding/ cramping. States on 9/16 she started bleeding lightly again and cramps and feels like having a period. States she had an ablation 10 years ago and had not had any bleeding and wants to know if this is normal or a problem.

## 2018-05-20 NOTE — Telephone Encounter (Signed)
Called patient and she states she already heard from Dr Alysia Penna this morning through [facility] & had her questions answered. Patient asked if she still shouldn't use tampons. Told her she shouldn't as that area is healing. Patient verbalized understanding & had no other questions.

## 2018-06-06 ENCOUNTER — Ambulatory Visit (INDEPENDENT_AMBULATORY_CARE_PROVIDER_SITE_OTHER): Payer: Self-pay | Admitting: Obstetrics and Gynecology

## 2018-06-06 ENCOUNTER — Encounter: Payer: Self-pay | Admitting: Obstetrics and Gynecology

## 2018-06-06 VITALS — BP 125/76 | HR 86 | Wt 222.2 lb

## 2018-06-06 DIAGNOSIS — Z9889 Other specified postprocedural states: Secondary | ICD-10-CM | POA: Insufficient documentation

## 2018-06-06 DIAGNOSIS — R87613 High grade squamous intraepithelial lesion on cytologic smear of cervix (HGSIL): Secondary | ICD-10-CM

## 2018-06-06 NOTE — Progress Notes (Signed)
[name] presents for post op appt. 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 Denies bowel or bladder dysfunction Pathology reviewed with pt. Margins clear  PE AF VSS Lungs clear Heart RRR Abd soft + BS GU Nl EGBUS, white discharge, conization site well healed  A/P S/P CKC Pt to return to nl ADL's F/U in 3 months for pap smear

## 2018-06-10 ENCOUNTER — Ambulatory Visit: Payer: Self-pay | Attending: Family Medicine | Admitting: Pharmacist

## 2018-06-10 ENCOUNTER — Ambulatory Visit (HOSPITAL_BASED_OUTPATIENT_CLINIC_OR_DEPARTMENT_OTHER): Payer: Self-pay | Admitting: Licensed Clinical Social Worker

## 2018-06-10 DIAGNOSIS — IMO0001 Reserved for inherently not codable concepts without codable children: Secondary | ICD-10-CM

## 2018-06-10 DIAGNOSIS — F331 Major depressive disorder, recurrent, moderate: Secondary | ICD-10-CM

## 2018-06-10 DIAGNOSIS — F419 Anxiety disorder, unspecified: Secondary | ICD-10-CM

## 2018-06-10 DIAGNOSIS — E1165 Type 2 diabetes mellitus with hyperglycemia: Secondary | ICD-10-CM

## 2018-06-10 LAB — GLUCOSE, POCT (MANUAL RESULT ENTRY): POC Glucose: 197 mg/dl — AB (ref 70–99)

## 2018-06-10 MED ORDER — INSULIN DETEMIR 100 UNIT/ML FLEXPEN: 36.0000 [IU] | PEN_INJECTOR | Freq: Two times a day (BID) | SUBCUTANEOUS | 2 refills | Status: DC

## 2018-06-10 MED ORDER — DULAGLUTIDE 1.5 MG/0.5ML ~~LOC~~ SOAJ: 1.5000 mg | SUBCUTANEOUS | 2 refills | Status: DC

## 2018-06-10 MED FILL — GABAPENTIN 300 MG CAPSULE: 300 | 30 days supply | Qty: 180 | Fill #5

## 2018-06-10 MED FILL — METFORMIN HCL ER 500 MG TAB: 500 | 30 days supply | Qty: 120 | Fill #1

## 2018-06-10 MED FILL — DICLOFENAC SOD EC 75 MG TAB: 75 | 30 days supply | Qty: 60 | Fill #1

## 2018-06-10 MED FILL — !TRULICITY 1.5 MG/0.5 ML PE: 1.5 | 28 days supply | Qty: 4 | Fill #0

## 2018-06-10 MED FILL — $LEVEMIR FLEXTOUCH 100 UNIT: 100 | 20 days supply | Qty: 15 | Fill #0

## 2018-06-10 MED FILL — FLUoxetine HCL 40 MG CAPS: 40 | 30 days supply | Qty: 30 | Fill #2

## 2018-06-10 NOTE — Patient Instructions (Signed)
Thank you for coming to see me today. Please do the following:  1. Increase Trulicity to 1.5 mg weekly.  2. Continue metformin 2 tablets 2 times daily.  3. Decrease Levemir to 36 units 2 times daily.  4. Continue checking blood sugars at home.  5. Continue making the lifestyle changes we've discussed together during our visit. Diet and exercise play a significant role in improving your blood sugars.  6. Follow-up with PCP in 07/05/18.    Hypoglycemia or low blood sugar:   Low blood sugar can happen quickly and may become an emergency if not treated right away.   While this shouldn't happen often, it can be brought upon if you skip a meal or do not eat enough. Also, if your insulin or other diabetes medications are dosed too high, this can cause your blood sugar to go to low.   Warning signs of low blood sugar include: 1. Feeling shaky or dizzy 2. Feeling weak or tired  3. Excessive hunger 4. Feeling anxious or upset  5. Sweating even when you aren't exercising  What to do if I experience low blood sugar? 1. Check your blood sugar with your meter. If lower than 70, proceed to step 2.  2. Treat with 3-4 glucose tablets or 3 packets of regular sugar. If these aren't around, you can try hard candy. Yet another option would be to drink 4 ounces of fruit juice or 6 ounces of REGULAR soda.  3. Re-check your sugar in 15 minutes. If it is still below 70, do what you did in step 2 again. If has come back up, go ahead and eat a snack or small meal at this time.

## 2018-06-10 NOTE — BH Specialist Note (Signed)
Integrated Behavioral Health Initial Visit  MRN: [id number] Name: [name]  Number of Integrated Behavioral Health Clinician visits:: 1/6 Session Start time: 3:00 PM  Session End time: 3:45 PM Total time: 45 minutes  Type of Service: Integrated Behavioral Health- Individual/Family Interpretor:No. Interpretor Name and Language: N/A   SUBJECTIVE: [name] is a 47 y.o. female accompanied by self Patient was referred by Dr. Laural Benes for depression and anxiety. Patient reports the following symptoms/concerns: Pt expressed feelings of overwhelm due to medical conditions that resulted in surgery, caring for adult son with special needs, and a recent move to make room for elderly mother to reside with pt.  Duration of problem: Ongoing; Severity of problem: moderate  OBJECTIVE: Mood: Anxious and Affect: Appropriate and Tearful Risk of harm to self or others: No plan to harm self or others  LIFE CONTEXT: Family and Social: Pt resides with her adult son and elderly mother. She is close with her niece, who is considered a strong support system School/Work: Pt is employed. She has been approved for the [employer] and [employer] Self-Care: Pt practices gratitude thinking Life Changes: Pt is the primary caregiver for her adult son with special needs and elderly mother. She has experienced multiple losses of loved ones (spouse, long-time boyfriend, and father)  GOALS ADDRESSED: Patient will: 1. Reduce symptoms of: anxiety, depression and stress 2. Increase knowledge and/or ability of: coping skills and healthy habits  3. Demonstrate ability to: Increase healthy adjustment to current life circumstances, Increase adequate support systems for patient/family and Begin healthy grieving over loss  INTERVENTIONS: Interventions utilized: Mindfulness or Management consultant, Supportive Counseling, Psychoeducation and/or Health Education and Link to [facility]  Standardized Assessments  completed: Not Needed  ASSESSMENT: Patient currently experiencing depression and anxiety triggered by medical conditions that resulted in surgery, grief of loved ones, caring for adult son with special needs, and a recent move to make room for elderly mother to reside with pt. Pt receives support from family. She denies HI/SI/AVH.    Patient may benefit from psychoeducation and psychotherapy. She is participating in medication management through PCP. LCSWA educated pt on the correlation between one's physical and mental health, in addition, to how stress can negatively impact both. Therapeutic interventions to promote mindfulness were discussed. Pt was provided supportive resources and encouraged to initiate psychotherapy with [facility] the [place].   PLAN: 1. Follow up with behavioral health clinician on : Pt was encouraged tocontact LCSWA if symptoms worsen or fail to improveto schedule behavioral appointments at [facility]. 2. Behavioral recommendations: LCSWA recommends that pt apply healthy coping skills discussed and initiate psychotherapy at [facility] the [place]. 3. Referral(s): Integrated Art gallery manager (In Clinic) and Community Mental Health Services (LME/Outside Clinic) 4. "From scale of 1-10, how likely are you to follow plan?":   Bridgett Larsson, LCSW 06/14/18 4:15 PM

## 2018-06-10 NOTE — Progress Notes (Signed)
   S:    PCP: Dr. Laural Benes  No chief complaint on file.  Patient arrives in good spirits. Presents for diabetes management at the request of Dr. Laural Benes (PCP). Patient was referred on 12/30/17 and last seen by PCP 04/26/18. I last saw her 05/11/18. Metformin was increased to metformin 500 mg XR two tablets BID.   Patient reports diabetes was diagnosed 14-15 years ago.   She reports adherence with medications. Current diabetes medications include:  - Levemir 40 units BID - Trulicity 0.75 mg once weekly. - Metformin 500 mg XR two tablets BID  Patient reports hypoglycemia. Reports 3 incidences in the past 10 days. Objective values: 72, 66, and 59.  Patient reported dietary habits: - Pt reports eating 3 meals a day.  - Including more fruits, vegetables, lean protein, and whole grains.  - States that she limits carbohydrates but lists carb-heavy food choices upon  Patient reported exercise habits:  -Patient reports walking for 30 minutes/day every other day   Patient denies nocturia.  Patient denies any new neuropathy. Patient denies visual changes. Patient reports self foot exams.   Social/Family History:  - FH: Father - MI, stroke - Tobacco use: nicotine through the use of vape/E-cig - Alcohol: denies  O:  POCT Glucose: 197; reports eating pasta/bread ~1-2 hours ago  Lab Results  Component Value Date   HGBA1C 11.2 (H) 04/26/2018   There were no vitals filed for this visit.  Home CBG levels:  05/31/18: FPG 113, 2-hr PPG: 162 06/01/18: FPG 143, 2-hr PPG: didn't take, no evening Levemir 06/02/18: FPG 100, 2-hr PPG: 190 06/03/18: FPG 72, 2-hr PPG: 186 06/04/18: FPG 138, 2-hr PPG: 228, ate cupcake 06/05/18: FPG 131, 2-hr PPG: 171 06/06/18: FPG 66, 2-hr PPG: didn't take, no evening Levemir 06/07/18: FPG 115, 2-hr PPG: 149 06/08/18: FPG 59, 2-hr PPG: 194 06/09/18: FPG 94, 2-hr PPG: didn't take, no evening Levemir 06/10/18: FPG 161  A/P: Diabetes longstanding currently uncontrolled  based on A1c and home glucose levels. Patient reports hypoglycemic events but is able to verbalize appropriate hypoglycemia management plan. She treated successfully. Patient reports adherence with medication.   With hypoglycemia, will decrease insulin. She is doing well with Trulicity. She is concerned with weight gain associated with insulin use. Will increase Trulicity to 1.5 mg weekly. I am hopeful that this will increase glycemic control while minimizing hypoglycemia risk. The Trulicity may help with additional weight loss. She knows to monitor for hypoglycemia and report continued symptoms to [place].   -Decrease Levemir from 40 units BID to 36 units BID.  -Increase Trulicity from 0.75 mg to 1.5 mg weekly. -Increase/change metformin to 500 mg XR 2 tablets BID.  Written patient instructions provided.  Total time in face to face counseling 30 minutes.   Follow up with PCP on 07/05/18.  Patient seen with:  Leanne Chang, PharmD Candidate [employer] of 2021  Cloyde Reams, PharmD, CPP Clinical Pharmacist  [facility]  [phone]

## 2018-06-11 LAB — COMPREHENSIVE METABOLIC PANEL
ALT: 23 IU/L (ref 0–32)
AST: 15 IU/L (ref 0–40)
Albumin/Globulin Ratio: 1.9 (ref 1.2–2.2)
Albumin: 4.2 g/dL (ref 3.5–5.5)
Alkaline Phosphatase: 115 IU/L (ref 39–117)
BUN/Creatinine Ratio: 21 (ref 9–23)
BUN: 26 mg/dL — ABNORMAL HIGH (ref 6–24)
Bilirubin Total: <0.2 mg/dL (ref 0.0–1.2)
CO2: 25 mmol/L (ref 20–29)
Calcium: 9.1 mg/dL (ref 8.7–10.2)
Chloride: 102 mmol/L (ref 96–106)
Creatinine, Ser: 1.21 mg/dL — ABNORMAL HIGH (ref 0.57–1.00)
GFR calc Af Amer: 61 mL/min/{1.73_m2} (ref >59)
GFR calc non Af Amer: 53 mL/min/{1.73_m2} — ABNORMAL LOW (ref >59)
Globulin, Total: 2.2 g/dL (ref 1.5–4.5)
Glucose: 180 mg/dL — ABNORMAL HIGH (ref 65–99)
Potassium: 4.7 mmol/L (ref 3.5–5.2)
Sodium: 141 mmol/L (ref 134–144)
Total Protein: 6.4 g/dL (ref 6.0–8.5)

## 2018-06-11 LAB — LIPID PANEL
Chol/HDL Ratio: 4.4 ratio (ref 0.0–4.4)
Cholesterol, Total: 219 mg/dL — ABNORMAL HIGH (ref 100–199)
HDL: 50 mg/dL (ref >39)
LDL Calculated: 121 mg/dL — ABNORMAL HIGH (ref 0–99)
Triglycerides: 240 mg/dL — ABNORMAL HIGH (ref 0–149)
VLDL Cholesterol Cal: 48 mg/dL — ABNORMAL HIGH (ref 5–40)

## 2018-06-12 ENCOUNTER — Other Ambulatory Visit: Payer: Self-pay | Admitting: Internal Medicine

## 2018-06-12 MED ORDER — ATORVASTATIN CALCIUM 10 MG PO TABS: 10.0000 mg | ORAL_TABLET | Freq: Every day | ORAL | 3 refills | Status: DC

## 2018-07-05 ENCOUNTER — Ambulatory Visit: Payer: Self-pay | Attending: Internal Medicine | Admitting: Internal Medicine

## 2018-07-05 ENCOUNTER — Encounter: Payer: Self-pay | Admitting: Internal Medicine

## 2018-07-05 VITALS — BP 125/85 | HR 87 | Temp 98.0°F | Resp 16 | Wt 223.6 lb

## 2018-07-05 DIAGNOSIS — G4733 Obstructive sleep apnea (adult) (pediatric): Secondary | ICD-10-CM | POA: Insufficient documentation

## 2018-07-05 DIAGNOSIS — Z79899 Other long term (current) drug therapy: Secondary | ICD-10-CM | POA: Insufficient documentation

## 2018-07-05 DIAGNOSIS — Z794 Long term (current) use of insulin: Secondary | ICD-10-CM | POA: Insufficient documentation

## 2018-07-05 DIAGNOSIS — E1165 Type 2 diabetes mellitus with hyperglycemia: Secondary | ICD-10-CM | POA: Insufficient documentation

## 2018-07-05 DIAGNOSIS — I129 Hypertensive chronic kidney disease with stage 1 through stage 4 chronic kidney disease, or unspecified chronic kidney disease: Secondary | ICD-10-CM | POA: Insufficient documentation

## 2018-07-05 DIAGNOSIS — F17200 Nicotine dependence, unspecified, uncomplicated: Secondary | ICD-10-CM

## 2018-07-05 DIAGNOSIS — Z8249 Family history of ischemic heart disease and other diseases of the circulatory system: Secondary | ICD-10-CM | POA: Insufficient documentation

## 2018-07-05 DIAGNOSIS — N183 Chronic kidney disease, stage 3 unspecified: Secondary | ICD-10-CM

## 2018-07-05 DIAGNOSIS — Z885 Allergy status to narcotic agent status: Secondary | ICD-10-CM | POA: Insufficient documentation

## 2018-07-05 DIAGNOSIS — E1122 Type 2 diabetes mellitus with diabetic chronic kidney disease: Secondary | ICD-10-CM | POA: Insufficient documentation

## 2018-07-05 DIAGNOSIS — F17210 Nicotine dependence, cigarettes, uncomplicated: Secondary | ICD-10-CM | POA: Insufficient documentation

## 2018-07-05 DIAGNOSIS — M19042 Primary osteoarthritis, left hand: Secondary | ICD-10-CM | POA: Insufficient documentation

## 2018-07-05 DIAGNOSIS — E669 Obesity, unspecified: Secondary | ICD-10-CM | POA: Insufficient documentation

## 2018-07-05 DIAGNOSIS — Z9049 Acquired absence of other specified parts of digestive tract: Secondary | ICD-10-CM | POA: Insufficient documentation

## 2018-07-05 DIAGNOSIS — Z6833 Body mass index (BMI) 33.0-33.9, adult: Secondary | ICD-10-CM | POA: Insufficient documentation

## 2018-07-05 DIAGNOSIS — Z9889 Other specified postprocedural states: Secondary | ICD-10-CM | POA: Insufficient documentation

## 2018-07-05 DIAGNOSIS — E1142 Type 2 diabetes mellitus with diabetic polyneuropathy: Secondary | ICD-10-CM | POA: Insufficient documentation

## 2018-07-05 DIAGNOSIS — Z882 Allergy status to sulfonamides status: Secondary | ICD-10-CM | POA: Insufficient documentation

## 2018-07-05 DIAGNOSIS — M19041 Primary osteoarthritis, right hand: Secondary | ICD-10-CM | POA: Insufficient documentation

## 2018-07-05 LAB — GLUCOSE, POCT (MANUAL RESULT ENTRY): POC Glucose: 162 mg/dl — AB (ref 70–99)

## 2018-07-05 MED ORDER — DICLOFENAC SODIUM 1 % TD GEL: 2.0000 g | Freq: Four times a day (QID) | TRANSDERMAL | 6 refills | Status: DC

## 2018-07-05 MED ORDER — DULAGLUTIDE 1.5 MG/0.5ML ~~LOC~~ SOAJ: 1.5000 mg | SUBCUTANEOUS | 2 refills | Status: DC

## 2018-07-05 MED ORDER — METFORMIN HCL ER 500 MG PO TB24: ORAL_TABLET | ORAL | 2 refills | Status: DC

## 2018-07-05 MED ORDER — INSULIN DETEMIR 100 UNIT/ML FLEXPEN: 36.0000 [IU] | PEN_INJECTOR | Freq: Two times a day (BID) | SUBCUTANEOUS | 2 refills | Status: DC

## 2018-07-05 MED ORDER — GABAPENTIN 300 MG PO CAPS: 900.0000 mg | ORAL_CAPSULE | Freq: Two times a day (BID) | ORAL | 6 refills | Status: DC

## 2018-07-05 MED FILL — DICLOFENAC SODIUM 1% GEL: 1 | 12 days supply | Qty: 100 | Fill #0

## 2018-07-05 MED FILL — $LEVEMIR FLEXTOUCH 100 UNIT: 100 | 40 days supply | Qty: 30 | Fill #0

## 2018-07-05 MED FILL — FLUoxetine HCL 40 MG CAPS: 40 | 30 days supply | Qty: 30 | Fill #3

## 2018-07-05 MED FILL — !TRULICITY 1.5 MG/0.5 ML PE: 1.5 | 28 days supply | Qty: 4 | Fill #0

## 2018-07-05 MED FILL — METFORMIN HCL ER 500 MG TAB: 500 | 30 days supply | Qty: 120 | Fill #0

## 2018-07-05 NOTE — Progress Notes (Signed)
Patient ID: [name], female    DOB: [birth year]  MRN: [id number]  CC: Follow-up   Subjective: [name] is a 47 y.o. female who presents for chronic ds management Her concerns today include:  Hx ofDMtype 2 with peripheral neuropathy,necrotizingfasciitis Rt thigh8/2018, HTN, dep/anx, tobdep, Abn PAP, arthritis  DM:  Checking BS twice.  Before BF BS less than 115, before dinner range 140-170.  Compliant with meds.  Trulicity increased on visit 05/2018 with clinical pharmacist.  She is tolerating the higher dose okay. Eating habits: thinks she eats more when BS are better but portion sizes are not large.     Not getting in much exercise.  She is wanting to get some of the weight off.  She was hoping that the Trulicity would help with that, but her weight has increased since September.  Tob dep: no longer smoke cigarettes.  Vaping x several mths. She feels that if she gives up vaping, "I won't do so well."  She feels she would eat more which will lead to more wgh gain -used nicotine patches in the past.  "The amount of nicotine in the patches mabe me sick." -did not find Chantix or Wellbutrin helpful either  Patient Active Problem List   Diagnosis Date Noted  . Post-operative state 06/06/2018  . HGSIL (high grade squamous intraepithelial lesion) on Pap smear of cervix 02/25/2018  . Right upper quadrant abdominal pain 12/30/2017  . Polyarthritis 12/30/2017  . Necrotizing fasciitis right thigh s/p I&D 03/17/2017 03/18/2017  . OSA (obstructive sleep apnea) 11/22/2013  . Dyspnea 11/22/2013  . Uncontrolled type 2 diabetes mellitus with hyperosmolar nonketotic hyperglycemia (HCC) 07/21/2012  . HTN (hypertension) 07/21/2012  . Smoker 07/21/2012     Current Outpatient Medications on File Prior to Visit  Medication Sig Dispense Refill  . atorvastatin (LIPITOR) 10 MG tablet Take 1 tablet (10 mg total) by mouth daily. 90 tablet 3  . esomeprazole (NEXIUM) 20 MG capsule Take  20 mg by mouth daily.     Marland Kitchen FLUoxetine (PROZAC) 40 MG capsule Take 1 capsule (40 mg total) by mouth daily. 30 capsule 11  . glucose blood (TRUE METRIX BLOOD GLUCOSE TEST) test strip Use as instructed 100 each 12  . LORazepam (ATIVAN) 1 MG tablet Take 1 tablet (1 mg total) by mouth 2 (two) times daily. 30 tablet 0  . TRUEPLUS SAFETY LANCETS 28G MISC Use as directed 100 each 3   No current facility-administered medications on file prior to visit.     Allergies  Allergen Reactions  . Codeine Itching  . Sulfa Antibiotics Hives    Social History   Socioeconomic History  . Marital status: Widowed    Spouse name: Not on file  . Number of children: 1  . Years of education: Not on file  . Highest education level: Not on file  Occupational History  . Occupation: none  Social Needs  . Financial resource strain: Not on file  . Food insecurity:    Worry: Not on file    Inability: Not on file  . Transportation needs:    Medical: Not on file    Non-medical: Not on file  Tobacco Use  . Smoking status: Current Every Day Smoker    Packs/day: 1.00    Years: 24.00    Pack years: 24.00    Types: E-cigarettes  . Smokeless tobacco: Never Used  . Tobacco comment: vapes with nicotene  Substance and Sexual Activity  . Alcohol use: Yes  Comment: seldom  . Drug use: No    Types: Marijuana    Comment: 25 years ago 11/22/13  . Sexual activity: Yes    Birth control/protection: None    Comment: has had endometrial ablation  Lifestyle  . Physical activity:    Days per week: Not on file    Minutes per session: Not on file  . Stress: Not on file  Relationships  . Social connections:    Talks on phone: Not on file    Gets together: Not on file    Attends religious service: Not on file    Active member of club or organization: Not on file    Attends meetings of clubs or organizations: Not on file    Relationship status: Not on file  . Intimate partner violence:    Fear of current or ex  partner: Not on file    Emotionally abused: Not on file    Physically abused: Not on file    Forced sexual activity: Not on file  Other Topics Concern  . Not on file  Social History Narrative  . Not on file    Family History  Problem Relation Age of Onset  . Aneurysm Mother   . Heart attack Father   . Stroke Father   . Breast cancer Paternal Grandmother     Past Surgical History:  Procedure Laterality Date  . CARDIAC CATHETERIZATION     4-61yrs ago  . CERVICAL CONIZATION W/BX N/A 05/04/2018   Procedure: CONIZATION CERVIX WITH BIOPSY - COLD KNIFE;  Surgeon: Hermina Staggers, MD;  Location: [facility];  Service: Gynecology;  Laterality: N/A;  . CHOLECYSTECTOMY    . ENDOMETRIAL ABLATION     6 years ago  . IRRIGATION AND DEBRIDEMENT ABSCESS Right 03/17/2017   Procedure: IRRIGATION AND DEBRIDEMENT RIGHT THIGH ABSCESS;  Surgeon: Karie Soda, MD;  Location: [facility];  Service: General;  Laterality: Right;  . LAPAROSCOPIC APPENDECTOMY  10/04/2011   Procedure: APPENDECTOMY LAPAROSCOPIC;  Surgeon: Rulon Abide, DO;  Location: [facility];  Service: General;  Laterality: N/A;    ROS: Review of Systems GU: She has had some decrease in GFR over the past several months.  She is on Voltaren 75 mg twice a day.  She inquires about being tried with Voltaren gel if it will help preserve kidney function by getting off of oral Voltaren.  She takes the Voltaren for arthritis pains in the hands. PHYSICAL EXAM: BP 125/85   Pulse 87   Temp 98 F (36.7 C) (Oral)   Resp 16   Wt 223 lb 9.6 oz (101.4 kg)   SpO2 98%   BMI 33.02 kg/m   Wt Readings from Last 3 Encounters:  07/05/18 223 lb 9.6 oz (101.4 kg)  06/06/18 222 lb 3.2 oz (100.8 kg)  05/04/18 215 lb 6.2 oz (97.7 kg)  BP 120/82  Physical Exam General appearance - alert, well appearing, middle-aged Caucasian female and in no distress Mental status - normal mood, behavior, speech, dress, motor activity, and thought  processes Mouth - mucous membranes moist, pharynx normal without lesions Neck - supple, no significant adenopathy Chest - clear to auscultation, no wheezes, rales or rhonchi, symmetric air entry Heart - normal rate, regular rhythm, normal S1, S2, no murmurs, rubs, clicks or gallops Extremities - peripheral pulses normal, no pedal edema, no clubbing or cyanosis Diabetic Foot Exam - Simple   Simple Foot Form Visual Inspection No deformities, no ulcerations, no other skin breakdown bilaterally:  Yes Sensation Testing Intact to touch and monofilament testing bilaterally:  Yes Pulse Check Posterior Tibialis and Dorsalis pulse intact bilaterally:  Yes Comments     Depression screen [facility] 2/9 06/06/2018 04/28/2018 04/26/2018  Decreased Interest 0 2 1  Down, Depressed, Hopeless 0 2 1  PHQ - 2 Score 0 4 2  Altered sleeping 0 1 2  Tired, decreased energy 0 2 1  Change in appetite 1 2 2   Feeling bad or failure about yourself  0 1 1  Trouble concentrating 0 2 2  Moving slowly or fidgety/restless 0 0 0  Suicidal thoughts 0 0 0  PHQ-9 Score 1 12 10    GAD 7 : Generalized Anxiety Score 06/06/2018 04/28/2018 04/26/2018 02/17/2018  Nervous, Anxious, on Edge 0 3 3 0  Control/stop worrying 0 3 3 0  Worry too much - different things 0 3 3 0  Trouble relaxing 0 3 3 0  Restless 0 2 2 0  Easily annoyed or irritable 0 3 3 0  Afraid - awful might happen 0 0 1 0  Total GAD 7 Score 0 17 18 0    Results for orders placed or performed in visit on 07/05/18  POCT glucose (manual entry)  Result Value Ref Range   POC Glucose 162 (A) 70 - 99 mg/dl   Lab Results  Component Value Date   HGBA1C 11.2 (H) 04/26/2018     Chemistry      Component Value Date/Time   NA 141 06/10/2018 1618   K 4.7 06/10/2018 1618   CL 102 06/10/2018 1618   CO2 25 06/10/2018 1618   BUN 26 (H) 06/10/2018 1618   CREATININE 1.21 (H) 06/10/2018 1618      Component Value Date/Time   CALCIUM 9.1 06/10/2018 1618   ALKPHOS 115  06/10/2018 1618   AST 15 06/10/2018 1618   ALT 23 06/10/2018 1618   BILITOT <0.2 06/10/2018 1618       ASSESSMENT AND PLAN:  1. Type 2 diabetes, controlled, with peripheral neuropathy (HCC) Reported blood sugars are a lot better.  She will continue current dose of Levemir, metformin, and Trulicity. Discussed healthy eating habits. Encourage regular exercise with the goal of getting in 30 minutes 3 times a week.  She plans to start doing so.  Advised to start low on go slow. - POCT glucose (manual entry) - gabapentin (NEURONTIN) 300 MG capsule; Take 3 capsules (900 mg total) by mouth 2 (two) times daily.  Dispense: 180 capsule; Refill: 6 - Insulin Detemir (LEVEMIR FLEXTOUCH) 100 UNIT/ML Pen; Inject 36 Units into the skin 2 (two) times daily.  Dispense: 15 mL; Refill: 2 - Dulaglutide (TRULICITY) 1.5 MG/0.5ML SOPN; Inject 1.5 mg into the skin once a week.  Dispense: 2 mL; Refill: 2 - metFORMIN (GLUCOPHAGE-XR) 500 MG 24 hr tablet; Take 2 tablets by mouth in the morning with breakfast and 2 tablets in the evening with dinner.  Dispense: 120 tablet; Refill: 2  2. Tobacco dependence Discussed health risks associated with vaping and encouraged her to discontinue doing so.  We discussed using lower strength of nicotine patches but patient is not interested in that at this time.  3. Arthritis of both hands I told her that I think it is a good idea to try stopping oral Voltaren and using topical instead - diclofenac sodium (VOLTAREN) 1 % GEL; Apply 2 g topically 4 (four) times daily.  Dispense: 100 g; Refill: 6  4. CKD (chronic kidney disease) stage 3, GFR 30-59  ml/min (HCC) See #3 above.  5. Obesity (BMI 30-39.9) See #1 above   Patient was given the opportunity to ask questions.  Patient verbalized understanding of the plan and was able to repeat key elements of the plan.   Orders Placed This Encounter  Procedures  . POCT glucose (manual entry)     Requested Prescriptions   Signed  Prescriptions Disp Refills  . gabapentin (NEURONTIN) 300 MG capsule 180 capsule 6    Sig: Take 3 capsules (900 mg total) by mouth 2 (two) times daily.  . Insulin Detemir (LEVEMIR FLEXTOUCH) 100 UNIT/ML Pen 15 mL 2    Sig: Inject 36 Units into the skin 2 (two) times daily.  . Dulaglutide (TRULICITY) 1.5 MG/0.5ML SOPN 2 mL 2    Sig: Inject 1.5 mg into the skin once a week.  . metFORMIN (GLUCOPHAGE-XR) 500 MG 24 hr tablet 120 tablet 2    Sig: Take 2 tablets by mouth in the morning with breakfast and 2 tablets in the evening with dinner.  . diclofenac sodium (VOLTAREN) 1 % GEL 100 g 6    Sig: Apply 2 g topically 4 (four) times daily.    Return in about 3 months (around 10/05/2018).  Jonah Blue, MD, FACP

## 2018-07-05 NOTE — Patient Instructions (Signed)

## 2018-07-12 MED FILL — GABAPENTIN 300 MG CAPSULE: 300 | 30 days supply | Qty: 180 | Fill #6

## 2018-07-21 ENCOUNTER — Telehealth: Payer: Self-pay | Admitting: Internal Medicine

## 2018-07-21 MED FILL — $TRULICITY 1.5 MG/0.5 ML PE: 1.5 | 13 days supply | Qty: 2 | Fill #1

## 2018-07-21 NOTE — Telephone Encounter (Signed)
Patient called to inform that she believes she is having an allergic reaction to trulicity and has broken out in rashes in both arms and back. Patient says it itches. Please follow up with patient.

## 2018-07-21 NOTE — Telephone Encounter (Signed)
Patient was informed to go to the Urgent care or Emergency department if they need to due to no available appointments.

## 2018-07-22 ENCOUNTER — Ambulatory Visit: Payer: Self-pay | Attending: Internal Medicine | Admitting: Internal Medicine

## 2018-07-22 ENCOUNTER — Encounter: Payer: Self-pay | Admitting: Internal Medicine

## 2018-07-22 VITALS — BP 123/86 | HR 90 | Temp 98.6°F | Resp 16 | Wt 221.0 lb

## 2018-07-22 DIAGNOSIS — I10 Essential (primary) hypertension: Secondary | ICD-10-CM | POA: Insufficient documentation

## 2018-07-22 DIAGNOSIS — Z79899 Other long term (current) drug therapy: Secondary | ICD-10-CM | POA: Insufficient documentation

## 2018-07-22 DIAGNOSIS — G4733 Obstructive sleep apnea (adult) (pediatric): Secondary | ICD-10-CM | POA: Insufficient documentation

## 2018-07-22 DIAGNOSIS — F17210 Nicotine dependence, cigarettes, uncomplicated: Secondary | ICD-10-CM | POA: Insufficient documentation

## 2018-07-22 DIAGNOSIS — L309 Dermatitis, unspecified: Secondary | ICD-10-CM | POA: Insufficient documentation

## 2018-07-22 DIAGNOSIS — E1100 Type 2 diabetes mellitus with hyperosmolarity without nonketotic hyperglycemic-hyperosmolar coma (NKHHC): Secondary | ICD-10-CM | POA: Insufficient documentation

## 2018-07-22 DIAGNOSIS — Z794 Long term (current) use of insulin: Secondary | ICD-10-CM | POA: Insufficient documentation

## 2018-07-22 DIAGNOSIS — M130 Polyarthritis, unspecified: Secondary | ICD-10-CM | POA: Insufficient documentation

## 2018-07-22 DIAGNOSIS — Z882 Allergy status to sulfonamides status: Secondary | ICD-10-CM | POA: Insufficient documentation

## 2018-07-22 MED ORDER — TRIAMCINOLONE ACETONIDE 0.1 % EX CREA: 1.0000 "application " | TOPICAL_CREAM | Freq: Two times a day (BID) | CUTANEOUS | 1 refills | Status: DC

## 2018-07-22 MED FILL — TRIAMCINOLONE ACETONIDE 0.1: 0.1 | 30 days supply | Qty: 45 | Fill #0

## 2018-07-22 NOTE — Progress Notes (Signed)
Patient ID: OMOLARA CAROL, female    DOB: Mar 05, 1971  MRN: [id number]  CC: Rash   Subjective: Lella Mullany is a 47 y.o. female who presents for UC visit Her concerns today include:   Pt c/o rash on both arms and back for over 1 mth.   Progressively getting worse and itches.  No one else in the house has the rash. No new body products.  She has been using Eucerin lotion to try dec the itching She wonders whether the higher dose of Trulicity may be causing it.  Dose was increased the beginning of last month. Symptoms started before she started using the Voltaren gel that was prescribed on last visit with me.  She has the orange card.  We spoke about trying to get her to a rheumatologist to clarify the diagnosis of RA.  Patient reports being diagnosed with this about 5 years ago by her previous PCP.  Her rheumatoid factor and anti-CCP were negative in May of this year.  She did have elevated sed rate in the 80s. Patient Active Problem List   Diagnosis Date Noted  . Post-operative state 06/06/2018  . HGSIL (high grade squamous intraepithelial lesion) on Pap smear of cervix 02/25/2018  . Right upper quadrant abdominal pain 12/30/2017  . Polyarthritis 12/30/2017  . Necrotizing fasciitis right thigh s/p I&D 03/17/2017 03/18/2017  . OSA (obstructive sleep apnea) 11/22/2013  . Dyspnea 11/22/2013  . Uncontrolled type 2 diabetes mellitus with hyperosmolar nonketotic hyperglycemia (HCC) 07/21/2012  . HTN (hypertension) 07/21/2012  . Smoker 07/21/2012     Current Outpatient Medications on File Prior to Visit  Medication Sig Dispense Refill  . atorvastatin (LIPITOR) 10 MG tablet Take 1 tablet (10 mg total) by mouth daily. 90 tablet 3  . diclofenac sodium (VOLTAREN) 1 % GEL Apply 2 g topically 4 (four) times daily. 100 g 6  . Dulaglutide (TRULICITY) 1.5 MG/0.5ML SOPN Inject 1.5 mg into the skin once a week. 2 mL 2  . esomeprazole (NEXIUM) 20 MG capsule Take 20 mg by mouth daily.     Marland Kitchen  FLUoxetine (PROZAC) 40 MG capsule Take 1 capsule (40 mg total) by mouth daily. 30 capsule 11  . gabapentin (NEURONTIN) 300 MG capsule Take 3 capsules (900 mg total) by mouth 2 (two) times daily. 180 capsule 6  . glucose blood (TRUE METRIX BLOOD GLUCOSE TEST) test strip Use as instructed 100 each 12  . Insulin Detemir (LEVEMIR FLEXTOUCH) 100 UNIT/ML Pen Inject 36 Units into the skin 2 (two) times daily. 15 mL 2  . LORazepam (ATIVAN) 1 MG tablet Take 1 tablet (1 mg total) by mouth 2 (two) times daily. 30 tablet 0  . metFORMIN (GLUCOPHAGE-XR) 500 MG 24 hr tablet Take 2 tablets by mouth in the morning with breakfast and 2 tablets in the evening with dinner. 120 tablet 2  . TRUEPLUS SAFETY LANCETS 28G MISC Use as directed 100 each 3   No current facility-administered medications on file prior to visit.     Allergies  Allergen Reactions  . Codeine Itching  . Sulfa Antibiotics Hives    Social History   Socioeconomic History  . Marital status: Widowed    Spouse name: Not on file  . Number of children: 1  . Years of education: Not on file  . Highest education level: Not on file  Occupational History  . Occupation: none  Social Needs  . Financial resource strain: Not on file  . Food insecurity:  Worry: Not on file    Inability: Not on file  . Transportation needs:    Medical: Not on file    Non-medical: Not on file  Tobacco Use  . Smoking status: Current Every Day Smoker    Packs/day: 1.00    Years: 24.00    Pack years: 24.00    Types: E-cigarettes  . Smokeless tobacco: Never Used  . Tobacco comment: vapes with nicotene  Substance and Sexual Activity  . Alcohol use: Yes    Comment: seldom  . Drug use: No    Types: Marijuana    Comment: 25 years ago 11/22/13  . Sexual activity: Yes    Birth control/protection: None    Comment: has had endometrial ablation  Lifestyle  . Physical activity:    Days per week: Not on file    Minutes per session: Not on file  . Stress: Not on  file  Relationships  . Social connections:    Talks on phone: Not on file    Gets together: Not on file    Attends religious service: Not on file    Active member of club or organization: Not on file    Attends meetings of clubs or organizations: Not on file    Relationship status: Not on file  . Intimate partner violence:    Fear of current or ex partner: Not on file    Emotionally abused: Not on file    Physically abused: Not on file    Forced sexual activity: Not on file  Other Topics Concern  . Not on file  Social History Narrative  . Not on file    Family History  Problem Relation Age of Onset  . Aneurysm Mother   . Heart attack Father   . Stroke Father   . Breast cancer Paternal Grandmother     Past Surgical History:  Procedure Laterality Date  . CARDIAC CATHETERIZATION     4-36yrs ago  . CERVICAL CONIZATION W/BX N/A 05/04/2018   Procedure: CONIZATION CERVIX WITH BIOPSY - COLD KNIFE;  Surgeon: Hermina Staggers, MD;  [facility];  Service: Gynecology;  Laterality: N/A;  . CHOLECYSTECTOMY    . ENDOMETRIAL ABLATION     6 years ago  . IRRIGATION AND DEBRIDEMENT ABSCESS Right 03/17/2017   Procedure: IRRIGATION AND DEBRIDEMENT RIGHT THIGH ABSCESS;  Surgeon: Karie Soda, MD;  Location: [facility];  Service: General;  Laterality: Right;  . LAPAROSCOPIC APPENDECTOMY  10/04/2011   Procedure: APPENDECTOMY LAPAROSCOPIC;  Surgeon: Rulon Abide, DO;  Location: [facility];  Service: General;  Laterality: N/A;    ROS: Review of Systems Negative except as above PHYSICAL EXAM: BP 123/86   Pulse 90   Temp 98.6 F (37 C) (Oral)   Resp 16   Wt 221 lb (100.2 kg)   SpO2 99%   BMI 32.64 kg/m   Physical Exam  General appearance - alert, well appearing, and in no distress Mental status - normal mood, behavior, speech, dress, motor activity, and thought processes Skin -small patches of dried skin noted over both forearms.  Several excoriated marks noted over  the posterior trunk and upper shoulders Skin is noted to be very dry on the upper and lower extremities Results for orders placed or performed in visit on 07/05/18  POCT glucose (manual entry)  Result Value Ref Range   POC Glucose 162 (A) 70 - 99 mg/dl    ASSESSMENT AND PLAN:  1. Dermatitis Doubt this is due to  the Trulicity.  I also do not think it is due to Voltaren gel as it was going on before we started that medication. She is noted to have very dry skin on the extremities.  I recommend use of moisturizing lotion at least twice a day. We will try her with triamcinolone cream. - triamcinolone cream (KENALOG) 0.1 %; Apply 1 application topically 2 (two) times daily.  Dispense: 45 g; Refill: 1  2. Polyarthritis Will refer to rheumatology for clarification of her diagnoses - Ambulatory referral to Rheumatology  Patient was given the opportunity to ask questions.  Patient verbalized understanding of the plan and was able to repeat key elements of the plan.   Orders Placed This Encounter  Procedures  . Ambulatory referral to Rheumatology     Requested Prescriptions   Signed Prescriptions Disp Refills  . triamcinolone cream (KENALOG) 0.1 % 45 g 1    Sig: Apply 1 application topically 2 (two) times daily.    Return if symptoms worsen or fail to improve.  Jonah Blue, MD, FACP

## 2018-07-22 NOTE — Telephone Encounter (Signed)
At the time that message was routed to Clinical research associate, Clinical research associate was out of the office. Pt has appointment today to see provider for evaluation. Will not call to f/u

## 2018-07-22 NOTE — Patient Instructions (Signed)
Use the Triamcinolone cream twice a day as needed.   Try to keep skin moisturized.

## 2018-08-12 MED FILL — FLUoxetine HCL 40 MG CAPS: 40 | 30 days supply | Qty: 30 | Fill #4

## 2018-08-12 MED FILL — METFORMIN HCL ER 500 MG TAB: 500 | 30 days supply | Qty: 120 | Fill #1

## 2018-08-12 MED FILL — DICLOFENAC SODIUM 1% GEL: 1 | 12 days supply | Qty: 100 | Fill #1

## 2018-08-12 MED FILL — GABAPENTIN 300 MG CAPSULE: 300 | 30 days supply | Qty: 180 | Fill #0

## 2018-09-02 MED FILL — $TRULICITY 1.5 MG/0.5 ML PE: 1.5 | 56 days supply | Qty: 4 | Fill #1

## 2018-09-12 MED FILL — METFORMIN HCL ER 500 MG TAB: 500 | 30 days supply | Qty: 120 | Fill #2

## 2018-09-12 MED FILL — FLUoxetine HCL 40 MG CAPS: 40 | 30 days supply | Qty: 30 | Fill #5

## 2018-09-12 MED FILL — GABAPENTIN 300 MG CAPSULE: 300 | 30 days supply | Qty: 180 | Fill #1

## 2018-09-29 ENCOUNTER — Ambulatory Visit: Payer: Self-pay | Attending: Family Medicine | Admitting: Physician Assistant

## 2018-09-29 VITALS — BP 126/81 | HR 102 | Temp 98.3°F | Ht 69.0 in | Wt 222.0 lb

## 2018-09-29 DIAGNOSIS — Z79899 Other long term (current) drug therapy: Secondary | ICD-10-CM | POA: Insufficient documentation

## 2018-09-29 DIAGNOSIS — I10 Essential (primary) hypertension: Secondary | ICD-10-CM | POA: Insufficient documentation

## 2018-09-29 DIAGNOSIS — Z87442 Personal history of urinary calculi: Secondary | ICD-10-CM | POA: Insufficient documentation

## 2018-09-29 DIAGNOSIS — Z882 Allergy status to sulfonamides status: Secondary | ICD-10-CM | POA: Insufficient documentation

## 2018-09-29 DIAGNOSIS — E1165 Type 2 diabetes mellitus with hyperglycemia: Secondary | ICD-10-CM

## 2018-09-29 DIAGNOSIS — E118 Type 2 diabetes mellitus with unspecified complications: Secondary | ICD-10-CM

## 2018-09-29 DIAGNOSIS — G4730 Sleep apnea, unspecified: Secondary | ICD-10-CM | POA: Insufficient documentation

## 2018-09-29 DIAGNOSIS — K219 Gastro-esophageal reflux disease without esophagitis: Secondary | ICD-10-CM | POA: Insufficient documentation

## 2018-09-29 DIAGNOSIS — Z885 Allergy status to narcotic agent status: Secondary | ICD-10-CM | POA: Insufficient documentation

## 2018-09-29 DIAGNOSIS — Z794 Long term (current) use of insulin: Secondary | ICD-10-CM

## 2018-09-29 DIAGNOSIS — E1142 Type 2 diabetes mellitus with diabetic polyneuropathy: Secondary | ICD-10-CM

## 2018-09-29 DIAGNOSIS — J0100 Acute maxillary sinusitis, unspecified: Secondary | ICD-10-CM

## 2018-09-29 LAB — POCT GLYCOSYLATED HEMOGLOBIN (HGB A1C): HbA1c, POC (controlled diabetic range): 8.2 % — AB (ref 0.0–7.0)

## 2018-09-29 LAB — GLUCOSE, POCT (MANUAL RESULT ENTRY): POC Glucose: 187 mg/dl — AB (ref 70–99)

## 2018-09-29 MED ORDER — AMOXICILLIN 500 MG PO CAPS: 500.0000 mg | ORAL_CAPSULE | Freq: Three times a day (TID) | ORAL | 0 refills | Status: DC

## 2018-09-29 MED ORDER — FLUTICASONE PROPIONATE 50 MCG/ACT NA SUSP: 2.0000 | Freq: Every day | NASAL | 6 refills | Status: DC

## 2018-09-29 MED ORDER — INSULIN DETEMIR 100 UNIT/ML FLEXPEN: 38.0000 [IU] | PEN_INJECTOR | Freq: Two times a day (BID) | SUBCUTANEOUS | 2 refills | Status: DC

## 2018-09-29 MED FILL — AMOXICILLIN 500 MG CAPSULE: 500 | 10 days supply | Qty: 30 | Fill #0

## 2018-09-29 MED FILL — $LEVEMIR FLEXTOUCH 100 UNIT: 100 | 59 days supply | Qty: 45 | Fill #0

## 2018-09-29 MED FILL — FLUTICASONE PROP 50 MCG SPR: 50 | 30 days supply | Qty: 16 | Fill #0

## 2018-09-29 NOTE — Progress Notes (Signed)
Patient ID: [name], female   DOB: 09/03/1970, 48 y.o.   MRN: [id number]   Meghan Hackerabatha Linch, is a 48 y.o. female  [id number]  [id number]  DOB - 09/03/1970  Subjective:  Chief Complaint and HPI: [name] is a 48 y.o. female here today with sinus pain and pressure with L ear pain and ST X 1 week.  No fever/chills.  Little cough.  Blowing purulent drainage from sinuses.  Compliant with diabetes meds.    ROS:   Constitutional:  No f/c, No night sweats, No unexplained weight loss. EENT:  No vision changes, No blurry vision, No hearing changes. No mouth, throat, or ear problems.  Respiratory: No cough, No SOB Cardiac: No CP, no palpitations GI:  No abd pain, No N/V/D. GU: No Urinary s/sx Musculoskeletal: No joint pain Neuro: No headache, no dizziness, no motor weakness.  Skin: No rash Endocrine:  No polydipsia. No polyuria.  Psych: Denies SI/HI  No problems updated.  ALLERGIES: Allergies  Allergen Reactions  . Codeine Itching  . Sulfa Antibiotics Hives    PAST MEDICAL HISTORY: Past Medical History:  Diagnosis Date  . Arthritis    fingers  . Cervical dysplasia   . Depression   . Diabetes mellitus   . Dyspnea 11/22/2013  . GERD (gastroesophageal reflux disease)   . Hypertension    no meds now  . Kidney stones   . PONV (postoperative nausea and vomiting)   . Sleep apnea    lost 70lbs no cpap x5937yrs now    MEDICATIONS AT HOME: Prior to Admission medications   Medication Sig Start Date End Date Taking? Authorizing Provider  atorvastatin (LIPITOR) 10 MG tablet Take 1 tablet (10 mg total) by mouth daily. 06/12/18  Yes Marcine MatarJohnson, Deborah B, MD  diclofenac sodium (VOLTAREN) 1 % GEL Apply 2 g topically 4 (four) times daily. 07/05/18  Yes Marcine MatarJohnson, Deborah B, MD  Dulaglutide (TRULICITY) 1.5 MG/0.5ML SOPN Inject 1.5 mg into the skin once a week. 07/05/18  Yes Marcine MatarJohnson, Deborah B, MD  esomeprazole (NEXIUM) 20 MG capsule Take 20 mg by mouth daily.    Yes [provider]  FLUoxetine (PROZAC) 40 MG capsule Take 1 capsule (40 mg total) by mouth daily. 12/10/17  Yes Danelle EarthlyNoel, Tiffany S, PA-C  gabapentin (NEURONTIN) 300 MG capsule Take 3 capsules (900 mg total) by mouth 2 (two) times daily. 07/05/18  Yes Marcine MatarJohnson, Deborah B, MD  glucose blood (TRUE METRIX BLOOD GLUCOSE TEST) test strip Use as instructed 01/13/18  Yes Marcine MatarJohnson, Deborah B, MD  Insulin Detemir (LEVEMIR FLEXTOUCH) 100 UNIT/ML Pen Inject 38 Units into the skin 2 (two) times daily. 09/29/18  Yes Livio Ledwith M, PA-C  LORazepam (ATIVAN) 1 MG tablet Take 1 tablet (1 mg total) by mouth 2 (two) times daily. 04/28/18  Yes Hermina StaggersErvin, Michael L, MD  metFORMIN (GLUCOPHAGE-XR) 500 MG 24 hr tablet Take 2 tablets by mouth in the morning with breakfast and 2 tablets in the evening with dinner. 07/05/18  Yes Marcine MatarJohnson, Deborah B, MD  triamcinolone cream (KENALOG) 0.1 % Apply 1 application topically 2 (two) times daily. 07/22/18  Yes Marcine MatarJohnson, Deborah B, MD  TRUEPLUS SAFETY LANCETS 28G MISC Use as directed 01/13/18  Yes Marcine MatarJohnson, Deborah B, MD  amoxicillin (AMOXIL) 500 MG capsule Take 1 capsule (500 mg total) by mouth 3 (three) times daily. 09/29/18   Anders SimmondsMcClung, Javarus Dorner M, PA-C  fluticasone (FLONASE) 50 MCG/ACT nasal spray Place 2 sprays into both nostrils daily. 09/29/18   Anders SimmondsMcClung, Krisanne Lich M, PA-C  Objective:  EXAM:   Vitals:   09/29/18 1406  BP: 126/81  Pulse: (!) 102  Temp: 98.3 F (36.8 C)  TempSrc: Oral  SpO2: 98%  Weight: 222 lb (100.7 kg)  Height: 5\' 9"  (1.753 m)    General appearance : A&OX3. NAD. Non-toxic-appearing HEENT: Atraumatic and Normocephalic.  PERRLA. EOM intact.  TM L bulging and erythematous.  R TM bulging w/o erythema.   Mouth-MMM, post pharynx WNL w/ mild erythema, + PND.  Sinuses TTP Neck: supple, no JVD. No cervical lymphadenopathy. No thyromegaly Chest/Lungs:  Breathing-non-labored, Good air entry bilaterally, breath sounds normal without rales, rhonchi, or wheezing  CVS: S1 S2 regular,  no murmurs, gallops, rubs  Extremities: Bilateral Lower Ext shows no edema, both legs are warm to touch with = pulse throughout Neurology:  CN II-XII grossly intact, Non focal.   Psych:  TP linear. J/I WNL. Normal speech. Appropriate eye contact and affect.  Skin:  No Rash  Data Review Lab Results  Component Value Date   HGBA1C 8.2 (A) 09/29/2018   HGBA1C 11.2 (H) 04/26/2018   HGBA1C >15 12/10/2017     Assessment & Plan   1. Acute maxillary sinusitis, recurrence not specified - amoxicillin (AMOXIL) 500 MG capsule; Take 1 capsule (500 mg total) by mouth 3 (three) times daily.  Dispense: 30 capsule; Refill: 0 - fluticasone (FLONASE) 50 MCG/ACT nasal spray; Place 2 sprays into both nostrils daily.  Dispense: 16 g; Refill: 6  2. Type 2 diabetes mellitus with complication, with long-term current use of insulin (HCC) Uncontrolled but improving - HgB A1c - Glucose (CBG)  3. Type 2 diabetes, controlled, with peripheral neuropathy (HCC) Uncontrolled but improving-increase dose - Insulin Detemir (LEVEMIR FLEXTOUCH) 100 UNIT/ML Pen; Inject 38 Units into the skin 2 (two) times daily.  Dispense: 15 mL; Refill: 2     Patient have been counseled extensively about nutrition and exercise  Return in about 3 months (around 12/29/2018) for Dr Laural BenesJohnson DM.  The patient was given clear instructions to go to ER or return to medical center if symptoms don't improve, worsen or new problems develop. The patient verbalized understanding. The patient was told to call to get lab results if they haven't heard anything in the next week.     Georgian CoAngela Malacki Mcphearson, PA-C [facility] [place], [place] [phone]   09/29/2018, 2:31 PM

## 2018-10-06 ENCOUNTER — Ambulatory Visit: Payer: Self-pay | Admitting: Internal Medicine

## 2018-10-13 MED FILL — METFORMIN HCL ER 500 MG TAB: 500 | 30 days supply | Qty: 120 | Fill #2

## 2018-10-13 MED FILL — FLUoxetine HCL 40 MG CAPS: 40 | 30 days supply | Qty: 30 | Fill #6

## 2018-10-13 MED FILL — GABAPENTIN 300 MG CAPSULE: 300 | 30 days supply | Qty: 180 | Fill #2

## 2018-11-02 ENCOUNTER — Emergency Department (HOSPITAL_BASED_OUTPATIENT_CLINIC_OR_DEPARTMENT_OTHER)
Admission: EM | Admit: 2018-11-02 | Discharge: 2018-11-02 | Disposition: A | Discharge status: Home / Self Care | Payer: Medicaid Other | Attending: Emergency Medicine | Admitting: Emergency Medicine

## 2018-11-02 ENCOUNTER — Encounter (HOSPITAL_BASED_OUTPATIENT_CLINIC_OR_DEPARTMENT_OTHER): Payer: Self-pay

## 2018-11-02 ENCOUNTER — Other Ambulatory Visit: Payer: Self-pay

## 2018-11-02 ENCOUNTER — Emergency Department (HOSPITAL_BASED_OUTPATIENT_CLINIC_OR_DEPARTMENT_OTHER): Payer: Medicaid Other

## 2018-11-02 DIAGNOSIS — I10 Essential (primary) hypertension: Secondary | ICD-10-CM | POA: Insufficient documentation

## 2018-11-02 DIAGNOSIS — R51 Headache: Secondary | ICD-10-CM | POA: Insufficient documentation

## 2018-11-02 DIAGNOSIS — Z794 Long term (current) use of insulin: Secondary | ICD-10-CM | POA: Insufficient documentation

## 2018-11-02 DIAGNOSIS — E119 Type 2 diabetes mellitus without complications: Secondary | ICD-10-CM | POA: Insufficient documentation

## 2018-11-02 DIAGNOSIS — R519 Headache, unspecified: Secondary | ICD-10-CM

## 2018-11-02 DIAGNOSIS — F17210 Nicotine dependence, cigarettes, uncomplicated: Secondary | ICD-10-CM | POA: Insufficient documentation

## 2018-11-02 DIAGNOSIS — Z79899 Other long term (current) drug therapy: Secondary | ICD-10-CM | POA: Insufficient documentation

## 2018-11-02 LAB — URINALYSIS, MICROSCOPIC (REFLEX)

## 2018-11-02 LAB — BASIC METABOLIC PANEL
Anion gap: 7 (ref 5–15)
BUN: 19 mg/dL (ref 6–20)
CO2: 25 mmol/L (ref 22–32)
Calcium: 8.8 mg/dL — ABNORMAL LOW (ref 8.9–10.3)
Chloride: 101 mmol/L (ref 98–111)
Creatinine, Ser: 1 mg/dL (ref 0.44–1.00)
GFR calc Af Amer: >60 mL/min (ref >60)
GFR calc non Af Amer: >60 mL/min (ref >60)
Glucose, Bld: 181 mg/dL — ABNORMAL HIGH (ref 70–99)
Potassium: 4 mmol/L (ref 3.5–5.1)
Sodium: 133 mmol/L — ABNORMAL LOW (ref 135–145)

## 2018-11-02 LAB — URINALYSIS, ROUTINE W REFLEX MICROSCOPIC
Bilirubin Urine: NEGATIVE
Glucose, UA: NEGATIVE mg/dL
Hgb urine dipstick: NEGATIVE
Ketones, ur: NEGATIVE mg/dL
Nitrite: NEGATIVE
Protein, ur: NEGATIVE mg/dL
Specific Gravity, Urine: 1.01 (ref 1.005–1.030)
pH: 6.5 (ref 5.0–8.0)

## 2018-11-02 LAB — CBC
HCT: 40.3 % (ref 36.0–46.0)
Hemoglobin: 12.9 g/dL (ref 12.0–15.0)
MCH: 27.4 pg (ref 26.0–34.0)
MCHC: 32 g/dL (ref 30.0–36.0)
MCV: 85.6 fL (ref 80.0–100.0)
Platelets: 213 10*3/uL (ref 150–400)
RBC: 4.71 MIL/uL (ref 3.87–5.11)
RDW: 11.9 % (ref 11.5–15.5)
WBC: 7.1 10*3/uL (ref 4.0–10.5)
nRBC: 0 % (ref 0.0–0.2)

## 2018-11-02 MED ORDER — ACETAMINOPHEN 500 MG PO TABS
1000.0000 mg | ORAL_TABLET | Freq: Once | ORAL | Status: AC
  Administered 2018-11-02: 1000 mg via ORAL
  Filled 2018-11-02: qty 2

## 2018-11-02 MED ORDER — DEXAMETHASONE SODIUM PHOSPHATE 10 MG/ML IJ SOLN
10.0000 mg | Freq: Once | INTRAMUSCULAR | Status: AC
  Administered 2018-11-02: 10 mg via INTRAMUSCULAR
  Filled 2018-11-02: qty 1

## 2018-11-02 MED ORDER — CYCLOBENZAPRINE HCL 10 MG PO TABS: 10.0000 mg | ORAL_TABLET | Freq: Two times a day (BID) | ORAL | 0 refills | Status: DC | PRN

## 2018-11-02 NOTE — ED Triage Notes (Signed)
Pt had sudden onset of pain to back of head ~10am while she was standing/talking-pt states pain was so severe she felt like she could not speak-pain then came around to "both sides of my face"-pain is now to back back of head, left side of face and shoulder-denies injury-pt states she was driven here by friend-NAD-steady gait

## 2018-11-02 NOTE — ED Provider Notes (Signed)
[facility] EMERGENCY DEPARTMENT Provider Note   CSN: [id number] Arrival date & time: 11/02/18  1200    History   Chief Complaint Chief Complaint  Patient presents with  . Headache    HPI [name] is a 48 y.o. female.     Patient is a 48 year old female who presents with a headache.  She states about 10:00 this morning she had sudden pain in the right posterior part of her head.  She states it was so severe that she had to stop talking for a minute.  It radiated around to both sides of her head.  Now she has more of an intense pain on the left side of the head that radiates down into her left neck and left arm.  She denies any numbness or weakness in her extremities.  No trouble with her speech or vision.  No numbness in her face.  No fevers.  No posterior neck pain.  No history of similar headaches in the past.  She has some nausea but no vomiting.     Past Medical History:  Diagnosis Date  . Arthritis    fingers  . Cervical dysplasia   . Depression   . Diabetes mellitus   . Dyspnea 11/22/2013  . GERD (gastroesophageal reflux disease)   . Hypertension    no meds now  . Kidney stones   . PONV (postoperative nausea and vomiting)   . Sleep apnea    lost 70lbs no cpap x78yrs now    Patient Active Problem List   Diagnosis Date Noted  . Post-operative state 06/06/2018  . HGSIL (high grade squamous intraepithelial lesion) on Pap smear of cervix 02/25/2018  . Right upper quadrant abdominal pain 12/30/2017  . Polyarthritis 12/30/2017  . Necrotizing fasciitis right thigh s/p I&D 03/17/2017 03/18/2017  . OSA (obstructive sleep apnea) 11/22/2013  . Dyspnea 11/22/2013  . Uncontrolled type 2 diabetes mellitus with hyperosmolar nonketotic hyperglycemia (HCC) 07/21/2012  . HTN (hypertension) 07/21/2012  . Smoker 07/21/2012    Past Surgical History:  Procedure Laterality Date  . CARDIAC CATHETERIZATION     4-72yrs ago  . CERVICAL CONIZATION W/BX N/A 05/04/2018     Procedure: CONIZATION CERVIX WITH BIOPSY - COLD KNIFE;  Surgeon: Hermina Staggers, MD;  Location: [facility];  Service: Gynecology;  Laterality: N/A;  . CHOLECYSTECTOMY    . ENDOMETRIAL ABLATION     6 years ago  . IRRIGATION AND DEBRIDEMENT ABSCESS Right 03/17/2017   Procedure: IRRIGATION AND DEBRIDEMENT RIGHT THIGH ABSCESS;  Surgeon: Karie Soda, MD;  Location: [facility];  Service: General;  Laterality: Right;  . LAPAROSCOPIC APPENDECTOMY  10/04/2011   Procedure: APPENDECTOMY LAPAROSCOPIC;  Surgeon: Rulon Abide, DO;  Location: [facility];  Service: General;  Laterality: N/A;     OB History    Gravida  3   Para  1   Term  0   Preterm  0   AB  2   Living  1     SAB  2   TAB  0   Ectopic  0   Multiple  0   Live Births  1            Home Medications    Prior to Admission medications   Medication Sig Start Date End Date Taking? Authorizing Provider  amoxicillin (AMOXIL) 500 MG capsule Take 1 capsule (500 mg total) by mouth 3 (three) times daily. 09/29/18   Anders Simmonds, PA-C  atorvastatin (LIPITOR)  10 MG tablet Take 1 tablet (10 mg total) by mouth daily. 06/12/18   Marcine Matar, MD  cyclobenzaprine (FLEXERIL) 10 MG tablet Take 1 tablet (10 mg total) by mouth 2 (two) times daily as needed for muscle spasms. 11/02/18   Rolan Bucco, MD  diclofenac sodium (VOLTAREN) 1 % GEL Apply 2 g topically 4 (four) times daily. 07/05/18   Marcine Matar, MD  Dulaglutide (TRULICITY) 1.5 MG/0.5ML SOPN Inject 1.5 mg into the skin once a week. 07/05/18   Marcine Matar, MD  esomeprazole (NEXIUM) 20 MG capsule Take 20 mg by mouth daily.     [provider]  FLUoxetine (PROZAC) 40 MG capsule Take 1 capsule (40 mg total) by mouth daily. 12/10/17   Vivianne Master, PA-C  fluticasone (FLONASE) 50 MCG/ACT nasal spray Place 2 sprays into both nostrils daily. 09/29/18   Anders Simmonds, PA-C  gabapentin (NEURONTIN) 300 MG capsule Take 3 capsules (900 mg  total) by mouth 2 (two) times daily. 07/05/18   Marcine Matar, MD  glucose blood (TRUE METRIX BLOOD GLUCOSE TEST) test strip Use as instructed 01/13/18   Marcine Matar, MD  Insulin Detemir (LEVEMIR FLEXTOUCH) 100 UNIT/ML Pen Inject 38 Units into the skin 2 (two) times daily. 09/29/18   Anders Simmonds, PA-C  LORazepam (ATIVAN) 1 MG tablet Take 1 tablet (1 mg total) by mouth 2 (two) times daily. 04/28/18   Hermina Staggers, MD  metFORMIN (GLUCOPHAGE-XR) 500 MG 24 hr tablet Take 2 tablets by mouth in the morning with breakfast and 2 tablets in the evening with dinner. 07/05/18   Marcine Matar, MD  triamcinolone cream (KENALOG) 0.1 % Apply 1 application topically 2 (two) times daily. 07/22/18   Marcine Matar, MD  TRUEPLUS SAFETY LANCETS 28G MISC Use as directed 01/13/18   Marcine Matar, MD    Family History Family History  Problem Relation Age of Onset  . Aneurysm Mother   . Heart attack Father   . Stroke Father   . Breast cancer Paternal Grandmother     Social History Social History   Tobacco Use  . Smoking status: Current Every Day Smoker    Packs/day: 1.00    Years: 24.00    Pack years: 24.00    Types: E-cigarettes  . Smokeless tobacco: Never Used  Substance Use Topics  . Alcohol use: Yes    Comment: rare  . Drug use: No     Allergies   Codeine and Sulfa antibiotics   Review of Systems Review of Systems  Constitutional: Negative for chills, diaphoresis, fatigue and fever.  HENT: Negative for congestion, rhinorrhea and sneezing.   Eyes: Negative.   Respiratory: Negative for cough, chest tightness and shortness of breath.   Cardiovascular: Negative for chest pain and leg swelling.  Gastrointestinal: Positive for nausea. Negative for abdominal pain, blood in stool, diarrhea and vomiting.  Genitourinary: Negative for difficulty urinating, flank pain, frequency and hematuria.  Musculoskeletal: Positive for neck pain. Negative for arthralgias and back  pain.  Skin: Negative for rash.  Neurological: Positive for headaches. Negative for dizziness, speech difficulty, weakness and numbness.     Physical Exam Updated Vital Signs BP (!) 144/103   Pulse 80   Temp 97.8 F (36.6 C) (Oral)   Resp (!) 23   Ht  (1.753 m)   Wt 99.8 kg   SpO2 97%   BMI 32.49 kg/m   Physical Exam Constitutional:  Appearance: She is well-developed.  HENT:     Head: Normocephalic and atraumatic.  Eyes:     Pupils: Pupils are equal, round, and reactive to light.  Neck:     Musculoskeletal: Normal range of motion and neck supple.     Comments: Mild tenderness along the left trapezius muscle Cardiovascular:     Rate and Rhythm: Normal rate and regular rhythm.     Heart sounds: Normal heart sounds.  Pulmonary:     Effort: Pulmonary effort is normal. No respiratory distress.     Breath sounds: Normal breath sounds. No wheezing or rales.  Chest:     Chest wall: No tenderness.  Abdominal:     General: Bowel sounds are normal.     Palpations: Abdomen is soft.     Tenderness: There is no abdominal tenderness. There is no guarding or rebound.  Musculoskeletal: Normal range of motion.  Lymphadenopathy:     Cervical: No cervical adenopathy.  Skin:    General: Skin is warm and dry.     Findings: No rash.  Neurological:     Mental Status: She is alert and oriented to person, place, and time.     Comments: Motor 5/5 all extremities Sensation grossly intact to LT all extremities Finger to Nose intact, no pronator drift CN II-XII grossly intact Gait normal       ED Treatments / Results  Labs (all labs ordered are listed, but only abnormal results are displayed) Labs Reviewed  BASIC METABOLIC PANEL - Abnormal; Notable for the following components:      Result Value   Sodium 133 (*)    Glucose, Bld 181 (*)    Calcium 8.8 (*)    All other components within normal limits  URINALYSIS, ROUTINE W REFLEX MICROSCOPIC - Abnormal; Notable for the  following components:   APPearance HAZY (*)    Leukocytes,Ua TRACE (*)    All other components within normal limits  URINALYSIS, MICROSCOPIC (REFLEX) - Abnormal; Notable for the following components:   Bacteria, UA FEW (*)    All other components within normal limits  CBC    EKG EKG Interpretation  Date/Time:  Wednesday November 02 2018 12:39:53 EST Ventricular Rate:  84 PR Interval:    QRS Duration: 90 QT Interval:  365 QTC Calculation: 432 R Axis:   93 Text Interpretation:  Sinus rhythm Borderline right axis deviation Baseline wander in lead(s) V1 V2 since last tracing no significant change Confirmed by Rolan Bucco [phone]) on 11/02/2018 1:16:21 PM Also confirmed by Rolan Bucco [phone]), editor Barbette Hair [phone])  on 11/02/2018 1:30:40 PM   Radiology Ct Head Wo Contrast  Result Date: 11/02/2018 CLINICAL DATA:  Posterior head pain beginning this morning, now radiating to face and teeth. History of hypertension and diabetes. EXAM: CT HEAD WITHOUT CONTRAST TECHNIQUE: Contiguous axial images were obtained from the base of the skull through the vertex without intravenous contrast. COMPARISON:  MRI of the head November 23 2017 FINDINGS: BRAIN: No intraparenchymal hemorrhage, mass effect nor midline shift. The ventricles and sulci are normal. No acute large vascular territory infarcts. No abnormal extra-axial fluid collections. Basal cisterns are patent. VASCULAR: Trace calcific atherosclerosis carotid siphon. SKULL/SOFT TISSUES: No skull fracture. No significant soft tissue swelling. ORBITS/SINUSES: The included ocular globes and orbital contents are normal.Trace paranasal sinus mucosal thickening. Mastoid air cells are well aerated. OTHER: None. IMPRESSION: Normal CT HEAD without contrast. Electronically Signed   By: Awilda Metro M.D.   On: 11/02/2018 13:51  Procedures Procedures (including critical care time)  Medications Ordered in ED Medications  acetaminophen (TYLENOL) tablet  1,000 mg (1,000 mg Oral Given 11/02/18 1356)  dexamethasone (DECADRON) injection 10 mg (10 mg Intramuscular Given 11/02/18 1441)     Initial Impression / Assessment and Plan / ED Course  I have reviewed the triage vital signs and the nursing notes.  Pertinent labs & imaging results that were available during my care of the patient were reviewed by me and considered in my medical decision making (see chart for details).        Patient is a 48 year old female who presents with a headache.  She has a headache that started the back of her head and now radiates to her left cheek and down the left side of her neck.  She does not have any anterior lateral neck pain.  It is reproducible on palpation over her paracervical area.  He does not have any neurologic deficits.  CT scan does not show any evidence of subarachnoid hemorrhage or other acute abnormality.  Her labs are non-concerning.  She does not have any strokelike symptoms.  She does not have symptoms that would be more concerning for carotid injury.  She really has not wanted anything for the pain other than Tylenol.  She did not want a migraine cocktail or any stronger medicines.  I suspect this may be nerve related from her upper cervical spine.  She was given a shot of Decadron and given a prescription for Flexeril to use in addition to Tylenol or ibuprofen at home.  She was encouraged to have follow-up with her PCP.  Return precautions were given.  Final Clinical Impressions(s) / ED Diagnoses   Final diagnoses:  Acute nonintractable headache, unspecified headache type    ED Discharge Orders         Ordered    cyclobenzaprine (FLEXERIL) 10 MG tablet  2 times daily PRN     11/02/18 1448           Rolan BuccoBelfi, Amauri Keefe, MD 11/02/18 1453

## 2018-11-14 ENCOUNTER — Other Ambulatory Visit: Payer: Self-pay | Admitting: Internal Medicine

## 2018-11-14 DIAGNOSIS — E1142 Type 2 diabetes mellitus with diabetic polyneuropathy: Secondary | ICD-10-CM

## 2018-11-14 MED FILL — GABAPENTIN 300 MG CAPSULE: 300 | 30 days supply | Qty: 180 | Fill #3

## 2018-11-14 MED FILL — DICLOFENAC SODIUM 1% GEL: 1 | 12 days supply | Qty: 100 | Fill #2

## 2018-11-14 MED FILL — FLUoxetine HCL 40 MG CAPS: 40 | 30 days supply | Qty: 30 | Fill #7

## 2018-11-15 MED FILL — ?METFORMIN HCL ER 500 MG TA: 500 | 30 days supply | Qty: 120 | Fill #0

## 2018-12-14 ENCOUNTER — Other Ambulatory Visit: Payer: Self-pay | Admitting: Physician Assistant

## 2018-12-14 MED FILL — GABAPENTIN 300 MG CAPSULE: 300 | 30 days supply | Qty: 180 | Fill #4

## 2018-12-14 MED FILL — FLUoxetine HCL 40 MG CAPS: 40 | 30 days supply | Qty: 30 | Fill #0

## 2018-12-14 MED FILL — METFORMIN HCL ER 500 MG TAB: 500 | 30 days supply | Qty: 120 | Fill #1

## 2018-12-15 ENCOUNTER — Ambulatory Visit: Payer: Self-pay | Attending: Internal Medicine | Admitting: Physician Assistant

## 2018-12-15 ENCOUNTER — Other Ambulatory Visit: Payer: Self-pay

## 2018-12-15 DIAGNOSIS — J0100 Acute maxillary sinusitis, unspecified: Secondary | ICD-10-CM

## 2018-12-15 DIAGNOSIS — E118 Type 2 diabetes mellitus with unspecified complications: Secondary | ICD-10-CM

## 2018-12-15 DIAGNOSIS — Z794 Long term (current) use of insulin: Secondary | ICD-10-CM

## 2018-12-15 MED ORDER — FLUCONAZOLE 150 MG PO TABS: 150.0000 mg | ORAL_TABLET | Freq: Once | ORAL | 0 refills | Status: AC

## 2018-12-15 MED ORDER — FLUTICASONE PROPIONATE 50 MCG/ACT NA SUSP: 2.0000 | Freq: Every day | NASAL | 6 refills | Status: DC

## 2018-12-15 MED ORDER — AMOXICILLIN 500 MG PO CAPS: 500.0000 mg | ORAL_CAPSULE | Freq: Three times a day (TID) | ORAL | 0 refills | Status: DC

## 2018-12-15 MED FILL — AMOXICILLIN 500 MG CAPSULE: 500 | 10 days supply | Qty: 30 | Fill #0

## 2018-12-15 MED FILL — FLUCONAZOLE 150 MG TABS: 150 | 1 days supply | Qty: 1 | Fill #0

## 2018-12-15 MED FILL — FLUTICASONE PROP 50 MCG SPR: 50 | 30 days supply | Qty: 16 | Fill #0

## 2018-12-15 NOTE — Progress Notes (Signed)
Patient statted she is having a ear ache, and all on the left side is hurting due to sinus. Patient stated it feels like ear infection and sinus.

## 2018-12-15 NOTE — Progress Notes (Signed)
Patient ID: [name], female   DOB: 1971/05/23, 48 y.o.   MRN: [id number]     Virtual Visit via Telephone Note  I connected with [name] on 12/15/18 at  1:50 PM EDT by telephone and verified that I am speaking with the correct person using two identifiers.   I discussed the limitations, risks, security and privacy concerns of performing an evaluation and management service by telephone and the availability of in person appointments. I also discussed with the patient that there may be a patient responsible charge related to this service. The patient expressed understanding and agreed to proceed.    Patient only on the phone at home.  I am in my office.     History of Present Illness:  Blood sugars running 85-240; usu 140. Taking meds as directed.  Admits to poor diet;  Esp during quarantine.   L side of face feels congested and painful.  L ear painful. Feels as though she has a sinus infection.  Symptoms for about 5-7 days.  Purulent drainage from nose.  No fever.  No cough.  Not using flonase.  No recent travel.    Feels fine otherwise.      Observations/Objective:  TP linear.  Speech is normal   Assessment and Plan: 1. Acute maxillary sinusitis, recurrence not specified - fluticasone (FLONASE) 50 MCG/ACT nasal spray; Place 2 sprays into both nostrils daily.  Dispense: 16 g; Refill: 6 - amoxicillin (AMOXIL) 500 MG capsule; Take 1 capsule (500 mg total) by mouth 3 (three) times daily.  Dispense: 30 capsule; Refill: 0 - fluconazole (DIFLUCAN) 150 MG tablet; Take 1 tablet (150 mg total) by mouth once for 1 dose.  Dispense: 1 tablet; Refill: 0  2. Type 2 diabetes mellitus with complication, with long-term current use of insulin (HCC) Not controlled- work on diet/eliminate sugars.  Continue current medication regimen.     Follow Up Instructions: 6 weeks with PCP.   I discussed the assessment and treatment plan with the patient. The patient was provided an opportunity to  ask questions and all were answered. The patient agreed with the plan and demonstrated an understanding of the instructions.   The patient was advised to call back or seek an in-person evaluation if the symptoms worsen or if the condition fails to improve as anticipated.  I provided 11 minutes of non-face-to-face time during this encounter.   Georgian Co, PA-C

## 2018-12-19 ENCOUNTER — Telehealth: Payer: Self-pay | Admitting: Internal Medicine

## 2018-12-19 NOTE — Telephone Encounter (Signed)
Patients call returned.  Patient identified by name and date of birth.  Patient has been taking antibiotic for sinus infection.  Patient is on day 5 of 10.  Patient also taking Flonase.  Patient states she feels as if the infection is getting worse not better.  Patient states that the Flonase is effective for 30 minutes but the pain comes back worse.  Patient endorses high CBG.  Triage explained that elevated CBG can interfere with medications.   Patient advises to continue antibiotic.  Patient advised to try Tylenol or ibuprofen.  Patient was on Voltaren but is not taking it anymore.  Patient advised that she could take ibuprofen if desired.  Patient asked if she could use a neti pot.  Patient advised that she could attempt that.  Patient has appointment on Wednesday am with A. McClungPA-C  Patient acknowledged understanding of advice.

## 2018-12-19 NOTE — Telephone Encounter (Signed)
Patient called stating that she is still not feeling good and states the antibiotics are not helping and she even feels worse. Patient would like to get advice on what she can do or if something else can be prescribed. Patient was also scheduled for an appt on wed.

## 2018-12-21 ENCOUNTER — Other Ambulatory Visit: Payer: Self-pay

## 2018-12-21 ENCOUNTER — Ambulatory Visit: Payer: Self-pay

## 2018-12-21 ENCOUNTER — Ambulatory Visit: Payer: Self-pay | Attending: Internal Medicine | Admitting: Physician Assistant

## 2018-12-21 ENCOUNTER — Encounter: Payer: Self-pay | Admitting: Physician Assistant

## 2018-12-21 DIAGNOSIS — E118 Type 2 diabetes mellitus with unspecified complications: Secondary | ICD-10-CM

## 2018-12-21 DIAGNOSIS — Z794 Long term (current) use of insulin: Secondary | ICD-10-CM

## 2018-12-21 DIAGNOSIS — H6982 Other specified disorders of Eustachian tube, left ear: Secondary | ICD-10-CM

## 2018-12-21 DIAGNOSIS — R110 Nausea: Secondary | ICD-10-CM

## 2018-12-21 MED ORDER — PREDNISONE 10 MG PO TABS: ORAL_TABLET | ORAL | 0 refills | Status: DC

## 2018-12-21 MED ORDER — PROMETHAZINE HCL 25 MG PO TABS: 25.0000 mg | ORAL_TABLET | Freq: Three times a day (TID) | ORAL | 0 refills | Status: DC | PRN

## 2018-12-21 NOTE — Progress Notes (Signed)
Pt states she is still having pain in her ear

## 2018-12-21 NOTE — Progress Notes (Signed)
Patient ID: [name], female   DOB: 16-Sep-1970, 48 y.o.   MRN: [id number]   Virtual Visit via Video Note  I connected with [name] on 12/21/18 at 10:50 AM EDT by a video enabled telemedicine application and verified that I am speaking with the correct person using two identifiers.   I discussed the limitations of evaluation and management by telemedicine and the availability of in person appointments. The patient expressed understanding and agreed to proceed.  Patient location: home My location: [facility] office Persons on the WebEx:  Myself and patient.  History of Present Illness: patient on webex visit c/o symptoms of sinusitis last week are no better.  She is hearing impaired in her R ear.  Her L ear is so congested and sound is muffled.  She has pain and pressure behind the ear.  No fever.  No exposure to Covid.  No travel.  On 7th day of antibiotics.  She has taken prednisone in the past and has done ok on it.  After her 3rd dose of the antibiotic daily, she gets nauseated.  No cough.    Her son and elderly mom live with her.    Blood sugars running <=150.    Observations/Objective:  Appears congested but not toxic.  TP linear.  Mood is stable.  Speech is normal.  NAD.  [place]/AT.    Assessment and Plan: 1. Dysfunction of left eustachian tube Finish amoxicillin, continue flonase and sudafed.  Check sugars while on prednisone.   - predniSONE (DELTASONE) 10 MG tablet; 6,5,4,3,2,1 take each days dose all at once with food in the morning  Dispense: 21 tablet; Refill: 0  2. Type 2 diabetes mellitus with complication, with long-term current use of insulin (HCC) Not at goal but have been <150s this week.  Continue current regimen, follow DM diet. Check sugars esp while on prednisone.  Precautions reviewed.    3. Nausea Secondary to antibiotic; can try - promethazine (PHENERGAN) 25 MG tablet; Take 1 tablet (25 mg total) by mouth every 8 (eight) hours as needed for nausea or  vomiting.  Dispense: 20 tablet; Refill: 0    Follow Up Instructions: 6 weeks as planned.    I discussed the assessment and treatment plan with the patient. The patient was provided an opportunity to ask questions and all were answered. The patient agreed with the plan and demonstrated an understanding of the instructions.   The patient was advised to call back or seek an in-person evaluation if the symptoms worsen or if the condition fails to improve as anticipated.  I provided 16 minutes of non-face-to-face time during this encounter.   Georgian Co, PA-C

## 2018-12-29 ENCOUNTER — Ambulatory Visit: Payer: Self-pay | Admitting: Internal Medicine

## 2019-01-11 MED FILL — GABAPENTIN 300 MG CAPSULE: 300 | 30 days supply | Qty: 180 | Fill #5

## 2019-01-11 MED FILL — METFORMIN HCL ER 500 MG TAB: 500 | 30 days supply | Qty: 120 | Fill #2

## 2019-01-11 MED FILL — FLUoxetine HCL 40 MG CAPS: 40 | 30 days supply | Qty: 30 | Fill #1

## 2019-02-14 ENCOUNTER — Other Ambulatory Visit: Payer: Self-pay | Admitting: Internal Medicine

## 2019-02-14 DIAGNOSIS — E1142 Type 2 diabetes mellitus with diabetic polyneuropathy: Secondary | ICD-10-CM

## 2019-02-14 MED FILL — METFORMIN HCL ER 500 MG TAB: 500 | 30 days supply | Qty: 120 | Fill #0

## 2019-02-14 MED FILL — FLUoxetine HCL 40 MG CAPS: 40 | 30 days supply | Qty: 30 | Fill #2

## 2019-02-14 MED FILL — GABAPENTIN 300 MG CAPSULE: 300 | 30 days supply | Qty: 180 | Fill #6

## 2019-03-13 ENCOUNTER — Other Ambulatory Visit: Payer: Self-pay | Admitting: Internal Medicine

## 2019-03-13 DIAGNOSIS — E1142 Type 2 diabetes mellitus with diabetic polyneuropathy: Secondary | ICD-10-CM

## 2019-03-13 MED FILL — FLUoxetine HCL 40 MG CAPS: 40 | 30 days supply | Qty: 30 | Fill #0

## 2019-03-13 MED FILL — METFORMIN HCL ER 500 MG TB2: 500 | 30 days supply | Qty: 120 | Fill #0

## 2019-03-14 MED FILL — GABAPENTIN 300 MG CAPSULE: 300 | 30 days supply | Qty: 180 | Fill #0

## 2019-04-10 ENCOUNTER — Other Ambulatory Visit: Payer: Self-pay | Admitting: Internal Medicine

## 2019-04-10 DIAGNOSIS — E1142 Type 2 diabetes mellitus with diabetic polyneuropathy: Secondary | ICD-10-CM

## 2019-04-19 ENCOUNTER — Other Ambulatory Visit: Payer: Self-pay | Admitting: Internal Medicine

## 2019-04-19 ENCOUNTER — Telehealth: Payer: Self-pay | Admitting: Internal Medicine

## 2019-04-19 DIAGNOSIS — E1142 Type 2 diabetes mellitus with diabetic polyneuropathy: Secondary | ICD-10-CM

## 2019-04-19 MED ORDER — METFORMIN HCL ER 500 MG PO TB24: ORAL_TABLET | ORAL | 0 refills | Status: DC

## 2019-04-19 MED FILL — METFORMIN HCL ER 500 MG TB2: 500 | 30 days supply | Qty: 120 | Fill #0

## 2019-04-19 MED FILL — GABAPENTIN 300 MG CAPSULE: 300 | 30 days supply | Qty: 180 | Fill #1

## 2019-04-19 MED FILL — FLUoxetine HCL 40 MG CAPS: 40 | 30 days supply | Qty: 30 | Fill #1

## 2019-04-19 NOTE — Telephone Encounter (Signed)
Refills for one month sent. Please call pt to schedule a PCP appointment.

## 2019-04-19 NOTE — Telephone Encounter (Signed)
New Message   1) Medication(s) Requested (by name): Metformin  2) [facility]  3) Special Requests: Pt states she is out   Approved medications will be sent to the pharmacy, we will reach out if there is an issue.  Requests made after 3pm may not be addressed until the following business day!  If a patient is unsure of the name of the medication(s) please note and ask patient to call back when they are able to provide all info, do not send to responsible party until all information is available!

## 2019-05-16 ENCOUNTER — Other Ambulatory Visit: Payer: Self-pay

## 2019-05-16 ENCOUNTER — Encounter: Payer: Self-pay | Admitting: Nurse Practitioner

## 2019-05-16 ENCOUNTER — Ambulatory Visit: Payer: Self-pay | Attending: Nurse Practitioner | Admitting: Nurse Practitioner

## 2019-05-16 DIAGNOSIS — F419 Anxiety disorder, unspecified: Secondary | ICD-10-CM

## 2019-05-16 DIAGNOSIS — E1142 Type 2 diabetes mellitus with diabetic polyneuropathy: Secondary | ICD-10-CM

## 2019-05-16 DIAGNOSIS — F32A Depression, unspecified: Secondary | ICD-10-CM

## 2019-05-16 DIAGNOSIS — F329 Major depressive disorder, single episode, unspecified: Secondary | ICD-10-CM

## 2019-05-16 DIAGNOSIS — L309 Dermatitis, unspecified: Secondary | ICD-10-CM

## 2019-05-16 MED ORDER — BD PEN NEEDLE MINI U/F 31G X 5 MM MISC: 3 refills | Status: DC

## 2019-05-16 MED ORDER — TRIAMCINOLONE ACETONIDE 0.1 % EX CREA: 1.0000 "application " | TOPICAL_CREAM | Freq: Two times a day (BID) | CUTANEOUS | 1 refills | Status: DC

## 2019-05-16 MED ORDER — METFORMIN HCL ER 500 MG PO TB24: ORAL_TABLET | ORAL | 0 refills | Status: DC

## 2019-05-16 MED ORDER — GABAPENTIN 300 MG PO CAPS: 900.0000 mg | ORAL_CAPSULE | Freq: Two times a day (BID) | ORAL | 6 refills | Status: DC

## 2019-05-16 MED ORDER — VICTOZA 18 MG/3ML ~~LOC~~ SOPN: PEN_INJECTOR | SUBCUTANEOUS | 3 refills | Status: DC

## 2019-05-16 MED ORDER — FLUOXETINE HCL 40 MG PO CAPS: 40.0000 mg | ORAL_CAPSULE | Freq: Every day | ORAL | 2 refills | Status: DC

## 2019-05-16 MED ORDER — HYDROXYZINE HCL 50 MG PO TABS: 25.0000 mg | ORAL_TABLET | Freq: Three times a day (TID) | ORAL | 1 refills | Status: AC | PRN

## 2019-05-16 MED FILL — METFORMIN HCL ER 500 MG TB2: 500 | 30 days supply | Qty: 120 | Fill #0

## 2019-05-16 MED FILL — TRIAMCINOLONE ACETONIDE 0.1: 0.1 | 14 days supply | Qty: 45 | Fill #0

## 2019-05-16 MED FILL — TRUEPLUS 5-BEVEL PEN NEEDLE: 31G X 5 MM | 30 days supply | Qty: 100 | Fill #0

## 2019-05-16 MED FILL — FLUoxetine HCL 40 MG CAPS: 40 | 30 days supply | Qty: 30 | Fill #0

## 2019-05-16 MED FILL — GABAPENTIN 300 MG CAPSULE: 300 | 30 days supply | Qty: 180 | Fill #0

## 2019-05-16 MED FILL — hydrOXYzine HCL 50 MG TABS: 50 | 20 days supply | Qty: 60 | Fill #0

## 2019-05-16 MED FILL — !VICTOZA 18MG/3ML INJECT: 18 | 30 days supply | Qty: 9 | Fill #0

## 2019-05-16 NOTE — Progress Notes (Signed)
Virtual Visit via Telephone Note Due to national recommendations of social distancing due to [place] 19, telehealth visit is felt to be most appropriate for this patient at this time.  I discussed the limitations, risks, security and privacy concerns of performing an evaluation and management service by telephone and the availability of in person appointments. I also discussed with the patient that there may be a patient responsible charge related to this service. The patient expressed understanding and agreed to proceed.    I connected with [name] on 05/16/19  at  10:50 AM EDT  EDT by telephone and verified that I am speaking with the correct person using two identifiers.   Consent I discussed the limitations, risks, security and privacy concerns of performing an evaluation and management service by telephone and the availability of in person appointments. I also discussed with the patient that there may be a patient responsible charge related to this service. The patient expressed understanding and agreed to proceed.   Location of Patient: Private Residence   Location of Provider: [place] and [place] participating in Telemedicine visit: Geryl Rankins FNP-BC [place]    History of Present Illness: Telemedicine visit for: Anxiety and Depression  She is very tearful throughout phone call. States her mother died Apr 06, 2023 from terminal cancer and the diagnosis was sudden and unexpected. She is also grieving over her significant other's past death and stressed with the burden of taking care of her disabled adult son.  She has been to grief counseling in the past and states she felt it helped her so she may decide to go back. However today she is requesting anxiety medication. Endorses medication compliance taking Prozac 40 mg daily as prescribed.   States her scalp is breaking out with a rash and she thinks it is related to stress.     She would like to stop Trulicity  And states it gives her gas and significant diarrhea.  She has been on Trulicity for over a year and a half and reports experiencing GI symptoms since the onset of taking it. At first she states her symptoms were tolerable but they have progressively worsened over the past few months. She only notes the diarrhea and gas within 24 hours after injecting Trulicity. Will stop trulicity at this time and start victoza. She will follow up with PCP. She is not taking her levemir as prescribed. States she has been too depressed and has not been thinking about taking her insulin. Blood sugars 300-400s when she does not take her levemir. I reccommended she follow up at [facility] or [place] for grief counseling, depression and anxiety. Unfortunately our Onsite LCSW is not in the office today. Patient denies any current thoughts of self harm.  I also recommended she call her CAP case manager and have them find another agency that can provide staff in the home to assist with her son and to help her with caregiver role strain.    Past Medical History:  Diagnosis Date  . Arthritis    fingers  . Cervical dysplasia   . Depression   . Diabetes mellitus   . Dyspnea 11/22/2013  . GERD (gastroesophageal reflux disease)   . Hypertension    no meds now  . Kidney stones   . PONV (postoperative nausea and vomiting)   . Sleep apnea    lost 70lbs no cpap x2yrs now    Past  Surgical History:  Procedure Laterality Date  . CARDIAC CATHETERIZATION     4-6456yrs ago  . CERVICAL CONIZATION W/BX N/A 05/04/2018   Procedure: CONIZATION CERVIX WITH BIOPSY - COLD KNIFE;  Surgeon: Hermina StaggersErvin, Michael L, MD;  Location: [facility];  Service: Gynecology;  Laterality: N/A;  . CHOLECYSTECTOMY    . ENDOMETRIAL ABLATION     6 years ago  . IRRIGATION AND DEBRIDEMENT ABSCESS Right 03/17/2017   Procedure: IRRIGATION AND DEBRIDEMENT RIGHT THIGH ABSCESS;  Surgeon: Karie SodaGross,  Steven, MD;  Location: [facility];  Service: General;  Laterality: Right;  . LAPAROSCOPIC APPENDECTOMY  10/04/2011   Procedure: APPENDECTOMY LAPAROSCOPIC;  Surgeon: Rulon AbideBrian David Layton, DO;  Location: [facility];  Service: General;  Laterality: N/A;    Family History  Problem Relation Age of Onset  . Aneurysm Mother   . Heart attack Father   . Stroke Father   . Breast cancer Paternal Grandmother     Social History   Socioeconomic History  . Marital status: Widowed    Spouse name: Not on file  . Number of children: 1  . Years of education: Not on file  . Highest education level: Not on file  Occupational History  . Occupation: none  Social Needs  . Financial resource strain: Not on file  . Food insecurity    Worry: Not on file    Inability: Not on file  . Transportation needs    Medical: Not on file    Non-medical: Not on file  Tobacco Use  . Smoking status: Current Every Day Smoker    Packs/day: 1.00    Years: 24.00    Pack years: 24.00    Types: E-cigarettes  . Smokeless tobacco: Never Used  Substance and Sexual Activity  . Alcohol use: Yes    Comment: rare  . Drug use: No  . Sexual activity: Yes    Birth control/protection: None  Lifestyle  . Physical activity    Days per week: Not on file    Minutes per session: Not on file  . Stress: Not on file  Relationships  . Social Musicianconnections    Talks on phone: Not on file    Gets together: Not on file    Attends religious service: Not on file    Active member of club or organization: Not on file    Attends meetings of clubs or organizations: Not on file    Relationship status: Not on file  Other Topics Concern  . Not on file  Social History Narrative  . Not on file     Observations/Objective: Awake, alert and oriented x 3   Review of Systems  Constitutional: Negative for fever, malaise/fatigue and weight loss.  HENT: Negative.  Negative for nosebleeds.   Eyes: Negative.  Negative for blurred vision, double vision  and photophobia.  Respiratory: Negative.  Negative for cough and shortness of breath.   Cardiovascular: Negative.  Negative for chest pain, palpitations and leg swelling.  Gastrointestinal: Negative.  Negative for heartburn, nausea and vomiting.  Musculoskeletal: Negative.  Negative for myalgias.  Skin: Positive for itching and rash.  Neurological: Negative.  Negative for dizziness, focal weakness, seizures and headaches.  Psychiatric/Behavioral: Positive for depression. Negative for suicidal ideas. The patient is nervous/anxious.     Assessment and Plan: Stephannie Petersabatha was seen today for medication refill.  Diagnoses and all orders for this visit:  Anxiety and depression -     FLUoxetine (PROZAC) 40 MG capsule; Take 1 capsule (40 mg  total) by mouth daily. -     hydrOXYzine (ATARAX/VISTARIL) 50 MG tablet; Take 0.5-1 tablets (25-50 mg total) by mouth 3 (three) times daily as needed. -     Ambulatory referral to Social Work  She requested klonopin or ativan. I explained to her that those medications are not medications that I prescribe for patients. She is willing to start hydroxyzine.   Type 2 diabetes, controlled, with peripheral neuropathy (HCC) -     gabapentin (NEURONTIN) 300 MG capsule; Take 3 capsules (900 mg total) by mouth 2 (two) times daily. -     metFORMIN (GLUCOPHAGE-XR) 500 MG 24 hr tablet; Take 2 tablets twice a day. -     liraglutide (VICTOZA) 18 MG/3ML SOPN; SubQ:  Inject 0.6 mg once daily into the skin. Week 2: increase to 1.2 mg once daily; week 3: increase to 1.8 mg once daily -     Insulin Pen Needle (B-D UF III MINI PEN NEEDLES) 31G X 5 MM MISC; Use as instructed. Inject into the skin three times daily  Diabetes is poorly controlled. Advised patient to keep a fasting blood sugar log fast, 2 hours post lunch and bedtime which will be reviewed at the next office visit.  Dermatitis -     triamcinolone cream (KENALOG) 0.1 %; Apply 1 application topically 2 (two) times  daily.    Follow Up Instructions Return in about 4 weeks (around 06/13/2019).     I discussed the assessment and treatment plan with the patient. The patient was provided an opportunity to ask questions and all were answered. The patient agreed with the plan and demonstrated an understanding of the instructions.   The patient was advised to call back or seek an in-person evaluation if the symptoms worsen or if the condition fails to improve as anticipated.  I provided 24 minutes of non-face-to-face time during this encounter including median intraservice time, reviewing previous notes, labs, imaging, medications and explaining diagnosis and management.  Claiborne Rigg, FNP-BC

## 2019-05-29 ENCOUNTER — Telehealth: Payer: Self-pay | Admitting: Licensed Clinical Social Worker

## 2019-05-29 NOTE — Telephone Encounter (Signed)
Call placed to patient. LCSW informed patient of [facility] referral. Pt agreed to schedule appt for 06/05/2019.

## 2019-06-05 ENCOUNTER — Other Ambulatory Visit: Payer: Self-pay

## 2019-06-05 ENCOUNTER — Encounter: Payer: Self-pay | Admitting: Internal Medicine

## 2019-06-05 ENCOUNTER — Ambulatory Visit: Payer: Medicaid Other | Attending: Internal Medicine | Admitting: Licensed Clinical Social Worker

## 2019-06-05 ENCOUNTER — Ambulatory Visit: Payer: Self-pay | Attending: Internal Medicine | Admitting: Internal Medicine

## 2019-06-05 VITALS — BP 115/82 | HR 94 | Temp 98.3°F | Resp 18 | Ht 69.0 in | Wt 228.0 lb

## 2019-06-05 DIAGNOSIS — F331 Major depressive disorder, recurrent, moderate: Secondary | ICD-10-CM

## 2019-06-05 DIAGNOSIS — Z1211 Encounter for screening for malignant neoplasm of colon: Secondary | ICD-10-CM

## 2019-06-05 DIAGNOSIS — Z23 Encounter for immunization: Secondary | ICD-10-CM

## 2019-06-05 DIAGNOSIS — E1165 Type 2 diabetes mellitus with hyperglycemia: Secondary | ICD-10-CM

## 2019-06-05 DIAGNOSIS — Z794 Long term (current) use of insulin: Secondary | ICD-10-CM

## 2019-06-05 DIAGNOSIS — L03011 Cellulitis of right finger: Secondary | ICD-10-CM

## 2019-06-05 DIAGNOSIS — S61259A Open bite of unspecified finger without damage to nail, initial encounter: Secondary | ICD-10-CM

## 2019-06-05 DIAGNOSIS — Z1231 Encounter for screening mammogram for malignant neoplasm of breast: Secondary | ICD-10-CM

## 2019-06-05 DIAGNOSIS — W5501XA Bitten by cat, initial encounter: Secondary | ICD-10-CM

## 2019-06-05 DIAGNOSIS — E1140 Type 2 diabetes mellitus with diabetic neuropathy, unspecified: Secondary | ICD-10-CM

## 2019-06-05 DIAGNOSIS — Z720 Tobacco use: Secondary | ICD-10-CM

## 2019-06-05 DIAGNOSIS — IMO0002 Reserved for concepts with insufficient information to code with codable children: Secondary | ICD-10-CM

## 2019-06-05 DIAGNOSIS — F4321 Adjustment disorder with depressed mood: Secondary | ICD-10-CM

## 2019-06-05 DIAGNOSIS — R030 Elevated blood-pressure reading, without diagnosis of hypertension: Secondary | ICD-10-CM

## 2019-06-05 LAB — GLUCOSE, POCT (MANUAL RESULT ENTRY): POC Glucose: 196 mg/dl — AB (ref 70–99)

## 2019-06-05 LAB — POCT GLYCOSYLATED HEMOGLOBIN (HGB A1C): Hemoglobin A1C: 12.2 % — AB (ref 4.0–5.6)

## 2019-06-05 MED ORDER — FLUOXETINE HCL 20 MG PO TABS: ORAL_TABLET | ORAL | 3 refills | Status: DC

## 2019-06-05 MED ORDER — LEVEMIR FLEXTOUCH 100 UNIT/ML ~~LOC~~ SOPN: 40.0000 [IU] | PEN_INJECTOR | Freq: Two times a day (BID) | SUBCUTANEOUS | 5 refills | Status: DC

## 2019-06-05 MED ORDER — FLUOXETINE HCL 40 MG PO CAPS: ORAL_CAPSULE | ORAL | 2 refills | Status: DC

## 2019-06-05 MED ORDER — HYDROCODONE-ACETAMINOPHEN 5-325 MG PO TABS: 1.0000 | ORAL_TABLET | Freq: Three times a day (TID) | ORAL | 0 refills | Status: DC | PRN

## 2019-06-05 MED ORDER — FLUCONAZOLE 150 MG PO TABS: 150.0000 mg | ORAL_TABLET | Freq: Once | ORAL | 0 refills | Status: AC

## 2019-06-05 MED ORDER — AMOXICILLIN-POT CLAVULANATE 875-125 MG PO TABS: 1.0000 | ORAL_TABLET | Freq: Two times a day (BID) | ORAL | 0 refills | Status: DC

## 2019-06-05 MED ORDER — METFORMIN HCL ER 500 MG PO TB24: 1000.0000 mg | ORAL_TABLET | Freq: Every day | ORAL | 6 refills | Status: DC

## 2019-06-05 MED FILL — FLUoxetine HCL 40 MG CAPS: 40 | 30 days supply | Qty: 30 | Fill #0

## 2019-06-05 MED FILL — METFORMIN HCL ER 500 MG TB2: 500 | 30 days supply | Qty: 60 | Fill #0

## 2019-06-05 MED FILL — ?FLUOXETINE HCL 20MG TABLE: 20 | 30 days supply | Qty: 30 | Fill #0

## 2019-06-05 MED FILL — AMOX-CLAV 875-125 MG TABLET: 875-125 | 14 days supply | Qty: 28 | Fill #0

## 2019-06-05 MED FILL — $LEVEMIR FLEXTOUCH 100 UNIT: 100 | 18 days supply | Qty: 15 | Fill #0

## 2019-06-05 MED FILL — FLUCONAZOLE 150 MG TABLET: 150 | 1 days supply | Qty: 1 | Fill #0

## 2019-06-05 NOTE — Progress Notes (Signed)
Patient ID: [name], female    DOB: [birth year]  MRN: [id number]  CC: Diabetes and Animal Bite   Subjective: [name] is a 48 y.o. 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 Pain through forearm when she bends finger.  Slight redness  DIABETES TYPE 2 Last A1C:   Results for orders placed or performed in visit on 06/05/19  HgB A1c  Result Value Ref Range   Hemoglobin A1C 12.2 (A) 4.0 - 5.6 %   HbA1c POC (<> result, manual entry)     HbA1c, POC (prediabetic range)     HbA1c, POC (controlled diabetic range)    Glucose (CBG)  Result Value Ref Range   POC Glucose 196 (A) 70 - 99 mg/dl    Med Adherence:  Taken off Trulicity by NP because pt reported it was causing significant diarrhea and gas. Changed to Victoza. Symptoms are a lot less.  Levemir 40 mg BID, Metformin 1 gram BID.  Forgets evening dose of Levemir 4 out of/7 days.  "I have I have been in a fog."     Medication side effects:  [x]  Yes    []  No Home Monitoring?  []  Yes    []  No Home glucose results range: Diet Adherence: []  Yes    [x]  No -"I want to eat all the time."  She attributes this to worsening depression.   Exercise: []  Yes    [x]  No Hypoglycemic episodes?: []  Yes    [x]  No Numbness of the feet? [x]  Yes    []  No Retinopathy hx? []  Yes    [x]  No Last eye exam: [facility] last yr.  Overdue.  She will try to get it done.  She is uninsured. Comments:   Dep/Anx: Patient reports worsening depression after the death of her mother in 03-24-2023 of this year.  She did not go to [facility] or [facility].  Feels she would benefit from counseling and needs higher dose of prozac No SI or hearing voices.  She will be seeing our LCSW today  HTN:  Not on any meds.  Use to be on  Lisinopril but has been controlled without medication  Tob dep:  Still vaping "but I don't vape much."  She states that she vapes about every 3 days.   Patient Active Problem List   Diagnosis Date Noted  . Post-operative state 06/06/2018  . HGSIL (high grade squamous intraepithelial lesion) on Pap smear of cervix 02/25/2018  . Right upper quadrant abdominal pain 12/30/2017  . Polyarthritis 12/30/2017  . OSA (obstructive sleep apnea) 11/22/2013  . Dyspnea 11/22/2013  . Uncontrolled type 2 diabetes mellitus with hyperosmolar nonketotic hyperglycemia ([place]) 07/21/2012  . HTN (hypertension) 07/21/2012  . Smoker 07/21/2012     Current Outpatient Medications on File Prior to Visit  Medication Sig Dispense Refill  . diclofenac sodium (VOLTAREN) 1 % GEL Apply 2 g topically 4 (four) times daily. 100 g 6  . esomeprazole (NEXIUM) 20 MG capsule Take 20 mg by mouth daily.     . fluticasone (FLONASE) 50 MCG/ACT nasal spray Place 2 sprays into both nostrils daily. 16 g 6  . gabapentin (NEURONTIN) 300 MG capsule Take 3 capsules (900 mg total) by mouth 2 (two) times daily. 180 capsule 6  . glucose blood (  TRUE METRIX BLOOD GLUCOSE TEST) test strip Use as instructed 100 each 12  . hydrOXYzine (ATARAX/VISTARIL) 50 MG tablet Take 0.5-1 tablets (25-50 mg total) by mouth 3 (three) times daily as needed. 60 tablet 1  . Insulin Pen Needle (B-D UF III MINI PEN NEEDLES) 31G X 5 MM MISC Use as instructed. Inject into the skin three times daily 100 each 3  . liraglutide (VICTOZA) 18 MG/3ML SOPN SubQ:  Inject 0.6 mg once daily into the skin. Week 2: increase to 1.2 mg once daily; week 3: increase to 1.8 mg once daily 3 pen 3  . triamcinolone cream (KENALOG) 0.1 % Apply 1 application topically 2 (two) times daily. 45 g 1  . TRUEPLUS SAFETY LANCETS 28G MISC Use as directed 100 each 3  . atorvastatin (LIPITOR) 10 MG tablet Take 1 tablet (10 mg total) by mouth daily. (Patient not taking: Reported on 06/05/2019) 90 tablet  3  . cyclobenzaprine (FLEXERIL) 10 MG tablet Take 1 tablet (10 mg total) by mouth 2 (two) times daily as needed for muscle spasms. (Patient not taking: Reported on 12/15/2018) 20 tablet 0   No current facility-administered medications on file prior to visit.     Allergies  Allergen Reactions  . Codeine Itching  . Sulfa Antibiotics Hives    Social History   Socioeconomic History  . Marital status: Widowed    Spouse name: Not on file  . Number of children: 1  . Years of education: Not on file  . Highest education level: Not on file  Occupational History  . Occupation: none  Social Needs  . Financial resource strain: Not on file  . Food insecurity    Worry: Not on file    Inability: Not on file  . Transportation needs    Medical: Not on file    Non-medical: Not on file  Tobacco Use  . Smoking status: Current Every Day Smoker    Packs/day: 0.00    Years: 24.00    Pack years: 0.00    Types: E-cigarettes  . Smokeless tobacco: Never Used  Substance and Sexual Activity  . Alcohol use: Yes    Comment: rare  . Drug use: No  . Sexual activity: Yes    Birth control/protection: None  Lifestyle  . Physical activity    Days per week: Not on file    Minutes per session: Not on file  . Stress: Not on file  Relationships  . Social Musicianconnections    Talks on phone: Not on file    Gets together: Not on file    Attends religious service: Not on file    Active member of club or organization: Not on file    Attends meetings of clubs or organizations: Not on file    Relationship status: Not on file  . Intimate partner violence    Fear of current or ex partner: Not on file    Emotionally abused: Not on file    Physically abused: Not on file    Forced sexual activity: Not on file  Other Topics Concern  . Not on file  Social History Narrative  . Not on file    Family History  Problem Relation Age of Onset  . Aneurysm Mother   . Heart attack Father   . Stroke Father   . Breast  cancer Paternal Grandmother     Past Surgical History:  Procedure Laterality Date  . CARDIAC CATHETERIZATION     4-7715yrs ago  . CERVICAL CONIZATION  W/BX N/A 05/04/2018   Procedure: CONIZATION CERVIX WITH BIOPSY - COLD KNIFE;  Surgeon: Hermina Staggers, MD;  Location: [facility];  Service: Gynecology;  Laterality: N/A;  . CHOLECYSTECTOMY    . ENDOMETRIAL ABLATION     6 years ago  . IRRIGATION AND DEBRIDEMENT ABSCESS Right 03/17/2017   Procedure: IRRIGATION AND DEBRIDEMENT RIGHT THIGH ABSCESS;  Surgeon: Karie Soda, MD;  Location: [facility];  Service: General;  Laterality: Right;  . LAPAROSCOPIC APPENDECTOMY  10/04/2011   Procedure: APPENDECTOMY LAPAROSCOPIC;  Surgeon: Rulon Abide, DO;  Location: [facility];  Service: General;  Laterality: N/A;    ROS: Review of Systems Negative except as stated above  PHYSICAL EXAM: BP 115/82 (BP Location: Left Arm, Patient Position: Sitting, Cuff Size: Large)   Pulse 94   Temp 98.3 F (36.8 C) (Oral)   Resp 18   Ht 5\' 9"  (1.753 m)   Wt 228 lb (103.4 kg)   SpO2 99%   BMI 33.67 kg/m   Physical Exam  General appearance - alert, well appearing, and in no distress Mental status -patient appears frustrated and at times tearful.   Neck - supple, no significant adenopathy Chest - clear to auscultation, no wheezes, rales or rhonchi, symmetric air entry Heart - normal rate, regular rhythm, normal S1, S2, no murmurs, rubs, clicks or gallops Musculoskeletal -right hand: She has mild to moderate edema of the right index finger.  Slight erythema.  She has moderate tenderness on palpation over the MCP and PIP joints.  Moderate discomfort with passive range of motion of these joints. Extremities -no lower extremity edema Diabetic Foot Exam - Simple   Simple Foot Form Visual Inspection No deformities, no ulcerations, no other skin breakdown bilaterally: Yes Sensation Testing See comments: Yes Pulse Check Posterior Tibialis and Dorsalis  pulse intact bilaterally: Yes Comments Decreased sensation to her feet on leap exam.      CMP Latest Ref Rng & Units 11/02/2018 06/10/2018 05/03/2018  Glucose 70 - 99 mg/dL 161(W) 960(A) 540(J)  BUN 6 - 20 mg/dL 19 81(X) 91(Y)  Creatinine 0.44 - 1.00 mg/dL 7.82 9.56(O) 1.30(Q)  Sodium 135 - 145 mmol/L 133(L) 141 136  Potassium 3.5 - 5.1 mmol/L 4.0 4.7 4.7  Chloride 98 - 111 mmol/L 101 102 104  CO2 22 - 32 mmol/L 25 25 23   Calcium 8.9 - 10.3 mg/dL 6.5(H) 9.1 9.0  Total Protein 6.0 - 8.5 g/dL - 6.4 -  Total Bilirubin 0.0 - 1.2 mg/dL - <8.4 -  Alkaline Phos 39 - 117 IU/L - 115 -  AST 0 - 40 IU/L - 15 -  ALT 0 - 32 IU/L - 23 -   Lipid Panel     Component Value Date/Time   CHOL 219 (H) 06/10/2018 1618   TRIG 240 (H) 06/10/2018 1618   HDL 50 06/10/2018 1618   CHOLHDL 4.4 06/10/2018 1618   CHOLHDL 6.2 10/29/2010 0250   VLDL 69 (H) 10/29/2010 0250   LDLCALC 121 (H) 06/10/2018 1618    CBC    Component Value Date/Time   WBC 7.1 11/02/2018 1246   RBC 4.71 11/02/2018 1246   HGB 12.9 11/02/2018 1246   HCT 40.3 11/02/2018 1246   PLT 213 11/02/2018 1246   MCV 85.6 11/02/2018 1246   MCH 27.4 11/02/2018 1246   MCHC 32.0 11/02/2018 1246   RDW 11.9 11/02/2018 1246   LYMPHSABS 3.5 07/07/2017 0830   MONOABS 0.4 07/07/2017 0830   EOSABS 0.1 07/07/2017 0830  BASOSABS 0.0 07/07/2017 0830    ASSESSMENT AND PLAN: 1. Uncontrolled type 2 diabetes mellitus with diabetic neuropathy, with long-term current use of insulin (HCC) Patient admits that she has not been as consistent as she should be with taking the Levemir especially the evening dose.  I have encouraged her to do so.  I think the diarrhea is more likely due to metformin done to GLP-1 agent.  Decrease metformin to 1 g daily.  Continue Victoza Encourage healthy eating habits and regular exercise Advised to get her eye exam as soon as she is able to afford - HgB A1c - Glucose (CBG) - Lipid panel - Hepatic Function Panel -  Microalbumin / creatinine urine ratio - Insulin Detemir (LEVEMIR FLEXTOUCH) 100 UNIT/ML Pen; Inject 40 Units into the skin 2 (two) times daily.  Dispense: 15 mL; Refill: 5 - metFORMIN (GLUCOPHAGE-XR) 500 MG 24 hr tablet; Take 2 tablets (1,000 mg total) by mouth daily with breakfast. Take 2 tablets twice a day.  Dispense: 60 tablet; Refill: 6  2. Cat bite of finger, initial encounter 3. Cellulitis of right index finger -Very concerning.  Will place her on Augmentin for 2 weeks, and refer to orthopedics.  Follow-up in 1 week for recheck -She is up-to-date with tetanus vaccine - amoxicillin-clavulanate (AUGMENTIN) 875-125 MG tablet; Take 1 tablet by mouth 2 (two) times daily.  Dispense: 28 tablet; Refill: 0 - Sedimentation Rate - CBC With Differential - Ambulatory referral to Orthopedic Surgery - DG Hand Complete Right; Future - HYDROcodone-acetaminophen (NORCO) 5-325 MG tablet; Take 1 tablet by mouth every 8 (eight) hours as needed for moderate pain.  Dispense: 15 tablet; Refill: 0   4. Moderate episode of recurrent major depressive disorder (HCC) Increase Prozac to 60 mg daily.  She will meet with LCSW today - FLUoxetine (PROZAC) 40 MG capsule; Take one tab daily with a 20 mg daily  Dispense: 30 capsule; Refill: 2 - FLUoxetine (PROZAC) 20 MG tablet; 1 tab daily witht a 60 mg tab  Dispense: 30 tablet; Refill: 3  5. Colon cancer screening - Fecal occult blood, imunochemical([facility]/Sun[facility])  6. Need for influenza vaccination Given  7. Vapes nicotine containing substance Discouraged use.  Discussed acute long illnesses that have been associated with vaping.  8. Elevated blood pressure reading DASH diet discussed and encouraged  9. Breast cancer screening by mammogram - MM Digital Screening; Future     Patient was given the opportunity to ask questions.  Patient verbalized understanding of the plan and was able to repeat key elements of the plan.   Orders Placed This Encounter   Procedures  . Fecal occult blood, imunochemical([facility]/Sunquest)  . MM Digital Screening  . DG Hand Complete Right  . Lipid panel  . Hepatic Function Panel  . Microalbumin / creatinine urine ratio  . Sedimentation Rate  . CBC With Differential  . Ambulatory referral to Orthopedic Surgery  . HgB A1c  . Glucose (CBG)     Requested Prescriptions   Signed Prescriptions Disp Refills  . Insulin Detemir (LEVEMIR FLEXTOUCH) 100 UNIT/ML Pen 15 mL 5    Sig: Inject 40 Units into the skin 2 (two) times daily.  . metFORMIN (GLUCOPHAGE-XR) 500 MG 24 hr tablet 60 tablet 6    Sig: Take 2 tablets (1,000 mg total) by mouth daily with breakfast. Take 2 tablets twice a day.  Marland Kitchen. FLUoxetine (PROZAC) 40 MG capsule 30 capsule 2    Sig: Take one tab daily with a 20 mg daily  . FLUoxetine (  PROZAC) 20 MG tablet 30 tablet 3    Sig: 1 tab daily witht a 60 mg tab  . amoxicillin-clavulanate (AUGMENTIN) 875-125 MG tablet 28 tablet 0    Sig: Take 1 tablet by mouth 2 (two) times daily.  Marland Kitchen HYDROcodone-acetaminophen (NORCO) 5-325 MG tablet 15 tablet 0    Sig: Take 1 tablet by mouth every 8 (eight) hours as needed for moderate pain.  . fluconazole (DIFLUCAN) 150 MG tablet 1 tablet 0    Sig: Take 1 tablet (150 mg total) by mouth once for 1 dose.    Return in about 1 week (around 06/12/2019).  Jonah Blue, MD, FACP

## 2019-06-05 NOTE — Patient Instructions (Signed)
I have prescribed Augmentin antibiotics for you to take twice a day for the next 2 weeks. I have referred you to orthopedics. Please remember to go to the radiology department to have x-ray done of the right hand.  Decrease metformin 1000 mg once a day.  Increase Prozac to 60 mg daily.  You will take a 40 mg along with a 20 mg tablet daily.

## 2019-06-06 ENCOUNTER — Encounter: Payer: Self-pay | Admitting: Internal Medicine

## 2019-06-06 LAB — CBC WITH DIFFERENTIAL
Basophils Absolute: 0 10*3/uL (ref 0.0–0.2)
Basos: 1 %
EOS (ABSOLUTE): 0 10*3/uL (ref 0.0–0.4)
Eos: 0 %
Hematocrit: 40.3 % (ref 34.0–46.6)
Hemoglobin: 13.4 g/dL (ref 11.1–15.9)
Immature Grans (Abs): 0 10*3/uL (ref 0.0–0.1)
Immature Granulocytes: 0 %
Lymphocytes Absolute: 2.2 10*3/uL (ref 0.7–3.1)
Lymphs: 27 %
MCH: 27.7 pg (ref 26.6–33.0)
MCHC: 33.3 g/dL (ref 31.5–35.7)
MCV: 83 fL (ref 79–97)
Monocytes Absolute: 0.4 10*3/uL (ref 0.1–0.9)
Monocytes: 5 %
Neutrophils Absolute: 5.5 10*3/uL (ref 1.4–7.0)
Neutrophils: 67 %
RBC: 4.83 x10E6/uL (ref 3.77–5.28)
RDW: 12.5 % (ref 11.7–15.4)
WBC: 8.2 10*3/uL (ref 3.4–10.8)

## 2019-06-06 LAB — HEPATIC FUNCTION PANEL
ALT: 17 IU/L (ref 0–32)
AST: 17 IU/L (ref 0–40)
Albumin: 4 g/dL (ref 3.8–4.8)
Alkaline Phosphatase: 109 IU/L (ref 39–117)
Bilirubin Total: <0.2 mg/dL (ref 0.0–1.2)
Bilirubin, Direct: 0.04 mg/dL (ref 0.00–0.40)
Total Protein: 6.4 g/dL (ref 6.0–8.5)

## 2019-06-06 LAB — MICROALBUMIN / CREATININE URINE RATIO
Creatinine, Urine: 114.3 mg/dL
Microalb/Creat Ratio: 15 mg/g creat (ref 0–29)
Microalbumin, Urine: 16.8 ug/mL

## 2019-06-06 LAB — LIPID PANEL
Chol/HDL Ratio: 4.7 ratio — ABNORMAL HIGH (ref 0.0–4.4)
Cholesterol, Total: 283 mg/dL — ABNORMAL HIGH (ref 100–199)
HDL: 60 mg/dL (ref >39)
LDL Chol Calc (NIH): 165 mg/dL — ABNORMAL HIGH (ref 0–99)
Triglycerides: 310 mg/dL — ABNORMAL HIGH (ref 0–149)
VLDL Cholesterol Cal: 58 mg/dL — ABNORMAL HIGH (ref 5–40)

## 2019-06-06 LAB — SEDIMENTATION RATE: Sed Rate: 25 mm/hr (ref 0–40)

## 2019-06-09 NOTE — BH Specialist Note (Signed)
Integrated Behavioral Health Initial Visit  MRN: [id number] Name: [name]  Number of [place] Clinician visits:: 1/6 Session Start time: 10:05 AM  Session End time: 10:30 AM Total time: 25 minutes  Type of Service: [place] Interpretor:No. Interpretor Name and Language: NA   Warm Hand Off Completed.       SUBJECTIVE: [name] is a 48 y.o. female accompanied by self Patient was referred by Dr. Jake Samples for grief. Patient reports the following symptoms/concerns: Pt shared mother recently passed away resulting in increase in depression, anxiety, and financial strain Duration of problem: Ongoing; Severity of problem: moderate  OBJECTIVE: Mood: Anxious and Affect: Depressed Risk of harm to self or others: No plan to harm self or others  LIFE CONTEXT: Family and Social: Pt resides with special needs adult son. She receives very limited support from family School/Work: Pt is employed Self-Care: Pt participates in medication management. She recently hired a Building control surveyor to assist with caring for son  Life Changes: Pt is grieving the loss of mother  STRENGTHS: Pt's depression and anxiety screens have decreased since last visit Pt participates in medication management  GOALS ADDRESSED: Patient will: 1. Reduce symptoms of: anxiety and depression 2. Increase knowledge and/or ability of: self-management skills  3. Demonstrate ability to: Begin healthy grieving over loss  INTERVENTIONS: Interventions utilized: Supportive Counseling, Psychoeducation and/or Health Education and Link to [employer]  Standardized Assessments completed: GAD-7 and PHQ 2&9  ASSESSMENT: Pt shared mother recently passed away resulting in increase in depression, anxiety, and financial strain. She participates in medication management through PCP.   Patient may benefit from psychotherapy. LCSW provided support and encouragement.  Psychoeducation on grief was provided. Pt provided LCSW with consent to complete Legal Aid referral to [facility] to assist with past due utility bills.  PLAN: 1. Follow up with behavioral health clinician on : Pt scheduled follow up appointment for 06/16/2019 2. Behavioral recommendations: Comply with medication management and utilize strategies discussed in session 3. Referral(s): [facility] (In Clinic) and [employer] Resources:  Housing 4. "From scale of 1-10, how likely are you to follow plan?":   Rebekah Chesterfield, LCSW 06/09/2019 10:41 AM

## 2019-06-13 ENCOUNTER — Other Ambulatory Visit: Payer: Self-pay

## 2019-06-13 ENCOUNTER — Emergency Department (HOSPITAL_BASED_OUTPATIENT_CLINIC_OR_DEPARTMENT_OTHER): Payer: Self-pay

## 2019-06-13 ENCOUNTER — Observation Stay (HOSPITAL_BASED_OUTPATIENT_CLINIC_OR_DEPARTMENT_OTHER)
Admission: EM | Admit: 2019-06-13 | Discharge: 2019-06-15 | Disposition: A | Discharge status: Home / Self Care | Payer: Self-pay | Attending: Internal Medicine | Admitting: Internal Medicine

## 2019-06-13 ENCOUNTER — Encounter (HOSPITAL_BASED_OUTPATIENT_CLINIC_OR_DEPARTMENT_OTHER): Payer: Self-pay

## 2019-06-13 DIAGNOSIS — E1100 Type 2 diabetes mellitus with hyperosmolarity without nonketotic hyperglycemic-hyperosmolar coma (NKHHC): Secondary | ICD-10-CM | POA: Insufficient documentation

## 2019-06-13 DIAGNOSIS — Z791 Long term (current) use of non-steroidal anti-inflammatories (NSAID): Secondary | ICD-10-CM | POA: Insufficient documentation

## 2019-06-13 DIAGNOSIS — I25110 Atherosclerotic heart disease of native coronary artery with unstable angina pectoris: Secondary | ICD-10-CM | POA: Insufficient documentation

## 2019-06-13 DIAGNOSIS — Z7982 Long term (current) use of aspirin: Secondary | ICD-10-CM | POA: Insufficient documentation

## 2019-06-13 DIAGNOSIS — Z794 Long term (current) use of insulin: Secondary | ICD-10-CM | POA: Insufficient documentation

## 2019-06-13 DIAGNOSIS — F17200 Nicotine dependence, unspecified, uncomplicated: Secondary | ICD-10-CM | POA: Diagnosis present

## 2019-06-13 DIAGNOSIS — Z7951 Long term (current) use of inhaled steroids: Secondary | ICD-10-CM | POA: Insufficient documentation

## 2019-06-13 DIAGNOSIS — Z20828 Contact with and (suspected) exposure to other viral communicable diseases: Secondary | ICD-10-CM | POA: Insufficient documentation

## 2019-06-13 DIAGNOSIS — IMO0002 Reserved for concepts with insufficient information to code with codable children: Secondary | ICD-10-CM

## 2019-06-13 DIAGNOSIS — E7800 Pure hypercholesterolemia, unspecified: Secondary | ICD-10-CM | POA: Insufficient documentation

## 2019-06-13 DIAGNOSIS — K219 Gastro-esophageal reflux disease without esophagitis: Secondary | ICD-10-CM | POA: Insufficient documentation

## 2019-06-13 DIAGNOSIS — I700 Atherosclerosis of aorta: Secondary | ICD-10-CM | POA: Insufficient documentation

## 2019-06-13 DIAGNOSIS — F329 Major depressive disorder, single episode, unspecified: Secondary | ICD-10-CM | POA: Insufficient documentation

## 2019-06-13 DIAGNOSIS — Z79899 Other long term (current) drug therapy: Secondary | ICD-10-CM | POA: Insufficient documentation

## 2019-06-13 DIAGNOSIS — E1165 Type 2 diabetes mellitus with hyperglycemia: Secondary | ICD-10-CM

## 2019-06-13 DIAGNOSIS — G4733 Obstructive sleep apnea (adult) (pediatric): Secondary | ICD-10-CM | POA: Insufficient documentation

## 2019-06-13 DIAGNOSIS — F17290 Nicotine dependence, other tobacco product, uncomplicated: Secondary | ICD-10-CM | POA: Insufficient documentation

## 2019-06-13 DIAGNOSIS — R079 Chest pain, unspecified: Principal | ICD-10-CM | POA: Insufficient documentation

## 2019-06-13 DIAGNOSIS — E785 Hyperlipidemia, unspecified: Secondary | ICD-10-CM | POA: Insufficient documentation

## 2019-06-13 DIAGNOSIS — E1140 Type 2 diabetes mellitus with diabetic neuropathy, unspecified: Secondary | ICD-10-CM

## 2019-06-13 DIAGNOSIS — M1990 Unspecified osteoarthritis, unspecified site: Secondary | ICD-10-CM | POA: Insufficient documentation

## 2019-06-13 DIAGNOSIS — I10 Essential (primary) hypertension: Secondary | ICD-10-CM | POA: Insufficient documentation

## 2019-06-13 HISTORY — DX: Pure hypercholesterolemia, unspecified: E78.00

## 2019-06-13 LAB — BASIC METABOLIC PANEL
Anion gap: 12 (ref 5–15)
BUN: 18 mg/dL (ref 6–20)
CO2: 26 mmol/L (ref 22–32)
Calcium: 9.4 mg/dL (ref 8.9–10.3)
Chloride: 97 mmol/L — ABNORMAL LOW (ref 98–111)
Creatinine, Ser: 0.98 mg/dL (ref 0.44–1.00)
GFR calc Af Amer: >60 mL/min (ref >60)
GFR calc non Af Amer: >60 mL/min (ref >60)
Glucose, Bld: 233 mg/dL — ABNORMAL HIGH (ref 70–99)
Potassium: 4.2 mmol/L (ref 3.5–5.1)
Sodium: 135 mmol/L (ref 135–145)

## 2019-06-13 LAB — TROPONIN I (HIGH SENSITIVITY)
Troponin I (High Sensitivity): <2 ng/L (ref <18)
Troponin I (High Sensitivity): <2 ng/L (ref <18)

## 2019-06-13 LAB — CBC
HCT: 46.2 % — ABNORMAL HIGH (ref 36.0–46.0)
Hemoglobin: 14.8 g/dL (ref 12.0–15.0)
MCH: 27.4 pg (ref 26.0–34.0)
MCHC: 32 g/dL (ref 30.0–36.0)
MCV: 85.4 fL (ref 80.0–100.0)
Platelets: 259 10*3/uL (ref 150–400)
RBC: 5.41 MIL/uL — ABNORMAL HIGH (ref 3.87–5.11)
RDW: 12.2 % (ref 11.5–15.5)
WBC: 8.1 10*3/uL (ref 4.0–10.5)
nRBC: 0 % (ref 0.0–0.2)

## 2019-06-13 LAB — PREGNANCY, URINE: Preg Test, Ur: NEGATIVE

## 2019-06-13 LAB — SARS CORONAVIRUS 2 BY RT PCR (HOSPITAL ORDER, PERFORMED IN ~~LOC~~ HOSPITAL LAB): SARS Coronavirus 2: NEGATIVE

## 2019-06-13 MED ORDER — HYDROCODONE-ACETAMINOPHEN 5-325 MG PO TABS: 1.0000 | ORAL_TABLET | Freq: Three times a day (TID) | ORAL | Status: DC | PRN

## 2019-06-13 MED ORDER — SODIUM CHLORIDE 0.9% FLUSH
3.0000 mL | Freq: Once | INTRAVENOUS | Status: AC
  Filled 2019-06-13: qty 3

## 2019-06-13 MED ORDER — INSULIN DETEMIR 100 UNIT/ML ~~LOC~~ SOLN
40.0000 [IU] | Freq: Two times a day (BID) | SUBCUTANEOUS | Status: DC
  Administered 2019-06-14 – 2019-06-15 (×4): 40 [IU] via SUBCUTANEOUS
  Filled 2019-06-13 (×5): qty 0.4

## 2019-06-13 MED ORDER — ENOXAPARIN SODIUM 40 MG/0.4ML ~~LOC~~ SOLN
40.0000 mg | Freq: Every day | SUBCUTANEOUS | Status: DC
  Administered 2019-06-14 – 2019-06-15 (×2): 40 mg via SUBCUTANEOUS
  Filled 2019-06-13 (×2): qty 0.4

## 2019-06-13 MED ORDER — GABAPENTIN 300 MG PO CAPS
900.0000 mg | ORAL_CAPSULE | Freq: Two times a day (BID) | ORAL | Status: DC
  Administered 2019-06-14 – 2019-06-15 (×4): 900 mg via ORAL
  Filled 2019-06-13 (×4): qty 3

## 2019-06-13 MED ORDER — PANTOPRAZOLE SODIUM 40 MG PO TBEC
40.0000 mg | DELAYED_RELEASE_TABLET | Freq: Every day | ORAL | Status: DC
  Administered 2019-06-14 – 2019-06-15 (×2): 40 mg via ORAL
  Filled 2019-06-13 (×2): qty 1

## 2019-06-13 MED ORDER — ACETAMINOPHEN 325 MG PO TABS: 650.0000 mg | ORAL_TABLET | ORAL | Status: DC | PRN

## 2019-06-13 MED ORDER — ONDANSETRON HCL 4 MG/2ML IJ SOLN: 4.0000 mg | Freq: Four times a day (QID) | INTRAMUSCULAR | Status: DC | PRN

## 2019-06-13 MED ORDER — FLUOXETINE HCL 20 MG PO CAPS
60.0000 mg | ORAL_CAPSULE | Freq: Every day | ORAL | Status: DC
  Administered 2019-06-14 – 2019-06-15 (×2): 60 mg via ORAL
  Filled 2019-06-13 (×2): qty 3

## 2019-06-13 MED ORDER — FLUOXETINE HCL 20 MG PO TABS: 40.0000 mg | ORAL_TABLET | Freq: Every day | ORAL | Status: DC

## 2019-06-13 MED ORDER — FLUTICASONE PROPIONATE 50 MCG/ACT NA SUSP
2.0000 | Freq: Every day | NASAL | Status: DC
  Administered 2019-06-14 – 2019-06-15 (×2): 2 via NASAL
  Filled 2019-06-13: qty 16

## 2019-06-13 MED ORDER — DICLOFENAC SODIUM 1 % TD GEL
2.0000 g | Freq: Three times a day (TID) | TRANSDERMAL | Status: DC
  Administered 2019-06-14 (×3): 2 g via TOPICAL
  Filled 2019-06-13: qty 100

## 2019-06-13 MED ORDER — TRIAMCINOLONE ACETONIDE 0.1 % EX CREA
1.0000 "application " | TOPICAL_CREAM | Freq: Two times a day (BID) | CUTANEOUS | Status: DC
  Administered 2019-06-14: 1 via TOPICAL
  Filled 2019-06-13: qty 15

## 2019-06-13 MED ORDER — NITROGLYCERIN 0.4 MG SL SUBL
0.4000 mg | SUBLINGUAL_TABLET | SUBLINGUAL | Status: DC | PRN
  Administered 2019-06-13: 0.4 mg via SUBLINGUAL
  Filled 2019-06-13: qty 1

## 2019-06-13 MED ORDER — HYDROXYZINE HCL 25 MG PO TABS: 25.0000 mg | ORAL_TABLET | Freq: Three times a day (TID) | ORAL | Status: DC | PRN

## 2019-06-13 MED ORDER — ASPIRIN 81 MG PO CHEW
324.0000 mg | CHEWABLE_TABLET | Freq: Once | ORAL | Status: AC
  Administered 2019-06-13: 324 mg via ORAL
  Filled 2019-06-13: qty 4

## 2019-06-13 NOTE — ED Notes (Signed)
Pt reports relief following first nitro, describes mild headache

## 2019-06-13 NOTE — ED Provider Notes (Signed)
[facility] EMERGENCY DEPARTMENT Provider Note   CSN: [id number] Arrival date & time: 06/13/19  1249     History   Chief Complaint Chief Complaint  Patient presents with  . Chest Pain    HPI [name] is a 48 y.o. female with history of type 2 diabetes mellitus, hyperlipidemia currently not taking any medications, hypertension which is been controlled with weight loss and dietary modification, GERD presents for evaluation of acute onset, constant left-sided chest pain since awakening this morning around 7 AM.  She reports the pain is dull and throbbing, worsens with ambulation.  It radiates into the left upper extremity.  It has been constant, waxing and waning since she noticed it this morning upon awakening.  Notes nausea and lightheadedness but no vomiting, diaphoresis, or syncope.  Endorses dyspnea on exertion as well.  Denies leg swelling, abdominal pain, fever, or cough.  She currently uses a vape, denies recreational drug use or excessive alcohol intake.  Notes multiple first-degree relatives who had bypass surgery in their 82s.  Has not tried anything for her symptoms.     The history is provided by the patient.    Past Medical History:  Diagnosis Date  . Arthritis    fingers  . Cervical dysplasia   . Depression   . Diabetes mellitus   . Dyspnea 11/22/2013  . GERD (gastroesophageal reflux disease)   . High cholesterol   . Hypertension    no meds now  . Kidney stones   . PONV (postoperative nausea and vomiting)   . Sleep apnea    lost 70lbs no cpap x20yrs now    Patient Active Problem List   Diagnosis Date Noted  . Chest pain 06/13/2019  . Post-operative state 06/06/2018  . HGSIL (high grade squamous intraepithelial lesion) on Pap smear of cervix 02/25/2018  . Right upper quadrant abdominal pain 12/30/2017  . Polyarthritis 12/30/2017  . OSA (obstructive sleep apnea) 11/22/2013  . Dyspnea 11/22/2013  . Uncontrolled type 2 diabetes mellitus with  hyperosmolar nonketotic hyperglycemia ([place]) 07/21/2012  . HTN (hypertension) 07/21/2012  . Smoker 07/21/2012    Past Surgical History:  Procedure Laterality Date  . CARDIAC CATHETERIZATION     4-82yrs ago  . CERVICAL CONIZATION W/BX N/A 05/04/2018   Procedure: CONIZATION CERVIX WITH BIOPSY - COLD KNIFE;  Surgeon: Chancy Milroy, MD;  Location: [facility];  Service: Gynecology;  Laterality: N/A;  . CHOLECYSTECTOMY    . ENDOMETRIAL ABLATION     6 years ago  . IRRIGATION AND DEBRIDEMENT ABSCESS Right 03/17/2017   Procedure: IRRIGATION AND DEBRIDEMENT RIGHT THIGH ABSCESS;  Surgeon: Michael Boston, MD;  Location: [facility];  Service: General;  Laterality: Right;  . LAPAROSCOPIC APPENDECTOMY  10/04/2011   Procedure: APPENDECTOMY LAPAROSCOPIC;  Surgeon: Judieth Keens, DO;  Location: [facility];  Service: General;  Laterality: N/A;     OB History    Gravida  3   Para  1   Term  0   Preterm  0   AB  2   Living  1     SAB  2   TAB  0   Ectopic  0   Multiple  0   Live Births  1            Home Medications    Prior to Admission medications   Medication Sig Start Date End Date Taking? Authorizing Provider  amoxicillin-clavulanate (AUGMENTIN) 875-125 MG tablet Take 1 tablet by mouth 2 (  two) times daily. 06/05/19   Marcine MatarJohnson, Deborah B, MD  atorvastatin (LIPITOR) 10 MG tablet Take 1 tablet (10 mg total) by mouth daily. Patient not taking: Reported on 06/05/2019 06/12/18   Marcine MatarJohnson, Deborah B, MD  cyclobenzaprine (FLEXERIL) 10 MG tablet Take 1 tablet (10 mg total) by mouth 2 (two) times daily as needed for muscle spasms. Patient not taking: Reported on 12/15/2018 11/02/18   Rolan BuccoBelfi, Melanie, MD  diclofenac sodium (VOLTAREN) 1 % GEL Apply 2 g topically 4 (four) times daily. 07/05/18   Marcine MatarJohnson, Deborah B, MD  esomeprazole (NEXIUM) 20 MG capsule Take 20 mg by mouth daily.     [provider]  FLUoxetine (PROZAC) 20 MG tablet 1 tab daily witht a 60 mg tab 06/05/19    Marcine MatarJohnson, Deborah B, MD  FLUoxetine (PROZAC) 40 MG capsule Take one tab daily with a 20 mg daily 06/05/19   Marcine MatarJohnson, Deborah B, MD  fluticasone [facility](FLONASE) 50 MCG/ACT nasal spray Place 2 sprays into both nostrils daily. 12/15/18   Anders SimmondsMcClung, Angela M, PA-C  gabapentin (NEURONTIN) 300 MG capsule Take 3 capsules (900 mg total) by mouth 2 (two) times daily. 05/16/19   Claiborne RiggFleming, Zelda W, NP  glucose blood (TRUE METRIX BLOOD GLUCOSE TEST) test strip Use as instructed 01/13/18   Marcine MatarJohnson, Deborah B, MD  HYDROcodone-acetaminophen (NORCO) 5-325 MG tablet Take 1 tablet by mouth every 8 (eight) hours as needed for moderate pain. 06/05/19   Marcine MatarJohnson, Deborah B, MD  hydrOXYzine (ATARAX/VISTARIL) 50 MG tablet Take 0.5-1 tablets (25-50 mg total) by mouth 3 (three) times daily as needed. 05/16/19 06/15/19  Claiborne RiggFleming, Zelda W, NP  Insulin Detemir (LEVEMIR FLEXTOUCH) 100 UNIT/ML Pen Inject 40 Units into the skin 2 (two) times daily. 06/05/19   Marcine MatarJohnson, Deborah B, MD  Insulin Pen Needle (B-D UF III MINI PEN NEEDLES) 31G X 5 MM MISC Use as instructed. Inject into the skin three times daily 05/16/19   Claiborne RiggFleming, Zelda W, NP  liraglutide (VICTOZA) 18 MG/3ML SOPN SubQ:  Inject 0.6 mg once daily into the skin. Week 2: increase to 1.2 mg once daily; week 3: increase to 1.8 mg once daily 05/16/19   Claiborne RiggFleming, Zelda W, NP  metFORMIN (GLUCOPHAGE-XR) 500 MG 24 hr tablet Take 2 tablets (1,000 mg total) by mouth daily with breakfast. Take 2 tablets twice a day. 06/05/19   Marcine MatarJohnson, Deborah B, MD  triamcinolone cream (KENALOG) 0.1 % Apply 1 application topically 2 (two) times daily. 05/16/19   Claiborne RiggFleming, Zelda W, NP  TRUEPLUS SAFETY LANCETS 28G MISC Use as directed 01/13/18   Marcine MatarJohnson, Deborah B, MD    Family History Family History  Problem Relation Age of Onset  . Aneurysm Mother   . Heart attack Father   . Stroke Father   . Breast cancer Paternal Grandmother     Social History Social History   Tobacco Use  . Smoking status: Current Every Day  Smoker    Packs/day: 0.00    Years: 24.00    Pack years: 0.00    Types: E-cigarettes  . Smokeless tobacco: Never Used  Substance Use Topics  . Alcohol use: Yes    Comment: rare  . Drug use: No     Allergies   Codeine and Sulfa antibiotics   Review of Systems Review of Systems  Constitutional: Negative for chills, diaphoresis and fever.  Respiratory: Positive for shortness of breath.   Cardiovascular: Positive for chest pain. Negative for palpitations and leg swelling.  Gastrointestinal: Positive for nausea. Negative for abdominal  pain and vomiting.  Neurological: Positive for light-headedness. Negative for syncope.  All other systems reviewed and are negative.    Physical Exam Updated Vital Signs BP (!) 144/92 (BP Location: Left Arm)   Pulse 84   Temp 98.3 F (36.8 C) (Oral)   Resp 19   Ht 5\' 9"  (1.753 m)   Wt 103.4 kg   SpO2 98%   BMI 33.67 kg/m   Physical Exam Vitals signs and nursing note reviewed.  Constitutional:      General: She is not in acute distress.    Appearance: She is well-developed.  HENT:     Head: Normocephalic and atraumatic.  Eyes:     General:        Right eye: No discharge.        Left eye: No discharge.     Conjunctiva/sclera: Conjunctivae normal.  Neck:     Musculoskeletal: Normal range of motion and neck supple.     Vascular: No JVD.     Trachea: No tracheal deviation.  Cardiovascular:     Rate and Rhythm: Normal rate and regular rhythm.     Pulses:          Radial pulses are 2+ on the right side and 2+ on the left side.       Dorsalis pedis pulses are 2+ on the right side and 2+ on the left side.       Posterior tibial pulses are 2+ on the right side and 2+ on the left side.     Heart sounds: Normal heart sounds.     Comments: Homans sign absent bilaterally, no lower extremity edema, no palpable cords, compartments are soft  Pulmonary:     Effort: Pulmonary effort is normal.  Chest:     Chest wall: No tenderness.   Abdominal:     General: There is no distension.     Palpations: Abdomen is soft.     Tenderness: There is no abdominal tenderness.  Musculoskeletal:     Right lower leg: She exhibits no tenderness. No edema.     Left lower leg: She exhibits no tenderness. No edema.  Skin:    General: Skin is warm and dry.     Findings: No erythema.  Neurological:     Mental Status: She is alert.  Psychiatric:        Behavior: Behavior normal.      ED Treatments / Results  Labs (all labs ordered are listed, but only abnormal results are displayed) Labs Reviewed  BASIC METABOLIC PANEL - Abnormal; Notable for the following components:      Result Value   Chloride 97 (*)    Glucose, Bld 233 (*)    All other components within normal limits  CBC - Abnormal; Notable for the following components:   RBC 5.41 (*)    HCT 46.2 (*)    All other components within normal limits  SARS CORONAVIRUS 2 BY RT PCR (HOSPITAL ORDER, PERFORMED IN [facility])  PREGNANCY, URINE  TROPONIN I (HIGH SENSITIVITY)  TROPONIN I (HIGH SENSITIVITY)    EKG EKG Interpretation  Date/Time:  Tuesday June 13 2019 12:57:29 EDT Ventricular Rate:  100 PR Interval:  146 QRS Duration: 80 QT Interval:  316 QTC Calculation: 407 R Axis:   77 Text Interpretation:  Normal sinus rhythm Normal ECG Confirmed by [id number] (Gwyneth Sprout) on 06/13/2019 2:05:47 PM   Radiology Dg Chest 2 View  Result Date: 06/13/2019 CLINICAL DATA:  Chest pain  EXAM: CHEST - 2 VIEW COMPARISON:  02/15/2018 FINDINGS: The heart size and mediastinal contours are within normal limits. Both lungs are clear. The visualized skeletal structures are unremarkable. IMPRESSION: No acute abnormality of the lungs. Electronically Signed   By: Lauralyn Primes M.D.   On: 06/13/2019 13:38    Procedures Procedures (including critical care time)  Medications Ordered in ED Medications  nitroGLYCERIN (NITROSTAT) SL tablet 0.4 mg (0.4 mg Sublingual Given  06/13/19 1504)  sodium chloride flush (NS) 0.9 % injection 3 mL (0 mLs Intravenous Return to [facility] 06/13/19 1509)  aspirin chewable tablet 324 mg (324 mg Oral Given 06/13/19 1502)     Initial Impression / Assessment and Plan / ED Course  I have reviewed the triage vital signs and the nursing notes.  Pertinent labs & imaging results that were available during my care of the patient were reviewed by me and considered in my medical decision making (see chart for details).        Patient presenting for evaluation of left-sided chest pains that she noted when she awoke this morning around 7 AM.  She is afebrile, initial diastolic blood pressure was a little bit elevated but otherwise vital signs are stable and within normal limits.  She is nontoxic in appearance.  Pain is not pleuritic, exertional, or reproducible on palpation.  EKG shows no acute ischemic abnormalities or arrhythmia.  Lab work reviewed by me shows no leukocytosis, no anemia, no metabolic derangements.  She is hyperglycemic with a glucose of 233.  Serial troponins are negative.  Chest x-ray shows no acute cardiopulmonary abnormalities.  Her pain resolved entirely after 1 sublingual nitroglycerin and has not recurred while she has been in the ED.  She has a HEART score of 5 and has high risk for cardiac disease.  She does not have an outpatient cardiologist and has some financial constraints related to her medical care.  For this reason I think she would benefit from admission to the hospital for chest pain work-up.  Spoke with Dr. Jacqulyn Bath with [facility] hospitalist service who agrees to assume care of patient and bring her into the hospital for further evaluation and management.  She will be transferred from med [facility] to [facility]. Final Clinical Impressions(s) / ED Diagnoses   Final diagnoses:  Left-sided chest pain    ED Discharge Orders    None       Bennye Alm 06/13/19 2337    Gwyneth Sprout, MD 06/14/19 [phone]

## 2019-06-13 NOTE — ED Triage Notes (Addendum)
Pt c/o CP x today-NAD-steady gait-pt added she is taking abx for recent cat bite and treating sinusitis with OTC med-denies fever/cough

## 2019-06-13 NOTE — Progress Notes (Signed)
Pt arrived from [facility] via [place]. Pt is without complaints of CP at this time. Pt does complain of HA to which she feels is d/t not eating all day. Telemetry applied-NSR. VS obtained and stable(refer to flowsheet). Pt given ginger ale and crackers for time being pending further orders. Jessie Foot, RN

## 2019-06-13 NOTE — Care Plan (Signed)
Received a call from ED physician for this 48 year old lady with a history of hypertension and diabetes and several family members with positive first-degree heart disease including CABG in their 55s who presented to ED with a complaint of chest pain that started at 7 AM.  This was radiating to the neck and left arm and associated with some nausea and shortness of breath.  Patient chest pain has resolved so far.  EKG has no acute ST-T wave changes.  Troponin x2 have been negative so far.  I was told by ED physician that according to the algorithm that the follow, patient will either benefit from outpatient follow-up for possible stress test or observation and a stress test in the morning and according to ED physician, patient has some financial constraints for which ED physician does not think that patient will follow-up closely and may get lost so she recommended admission for observation and possible stress test in the morning.  Patient has received 1 dose of nitroglycerin 0.4 mg sublingual along with full dose chewable aspirin in the emergency department.

## 2019-06-13 NOTE — H&P (Signed)
History and Physical   Meghan Hongabatha C Buckingham UJW:[id number] DOB: [birth year] DOA: 06/13/2019  Referring MD/NP/PA: Michela PitcherMina Fawze, PA  PCP: Marcine MatarJohnson, Deborah B, MD   Outpatient Specialists: None  Patient coming from: Home  Chief Complaint: Chest pain  HPI: [name] is a 48 y.o. female with medical history significant of diabetes, hypertension, hyperlipidemia, GERD among other things who went to [facility] with substernal chest pain.  Symptoms started today and persistent.  Has no known history of cardiac disease but has significant risk factors including above.  Chest pain was 8 out of 10 in the left side.  It awoke her in the morning.  Radiating into the left upper extremity.  It was dull and pressure-like.  It was worsened with activities and partially relieved by rest.  Chest pain is currently gone in the ER after nitroglycerin.  Patient is therefore being admitted to the hospital for MI work-up.  ED Course: Temperature 97.5 blood pressure 144/92 pulse 100 respirate 22 oxygen sat 93% room air.  White count 8.1 hemoglobin 14.9 platelet 259.  Other chemistry within normal.  COVID-19 is negative.  Initial troponin is negative x2.  Glucose 233.  Chest x-ray is negative.  Patient is high risk and is being admitted for possible cardiac evaluation and stress test  Review of Systems: As per HPI otherwise 10 point review of systems negative.    Past Medical History:  Diagnosis Date  . Arthritis    fingers  . Cervical dysplasia   . Depression   . Diabetes mellitus   . Dyspnea 11/22/2013  . GERD (gastroesophageal reflux disease)   . High cholesterol   . Hypertension    no meds now  . Kidney stones   . PONV (postoperative nausea and vomiting)   . Sleep apnea    lost 70lbs no cpap x7413yrs now    Past Surgical History:  Procedure Laterality Date  . CARDIAC CATHETERIZATION     4-6120yrs ago  . CERVICAL CONIZATION W/BX N/A 05/04/2018   Procedure: CONIZATION CERVIX WITH BIOPSY - COLD  KNIFE;  Surgeon: Hermina StaggersErvin, Michael L, MD;  Location: [facility];  Service: Gynecology;  Laterality: N/A;  . CHOLECYSTECTOMY    . ENDOMETRIAL ABLATION     6 years ago  . IRRIGATION AND DEBRIDEMENT ABSCESS Right 03/17/2017   Procedure: IRRIGATION AND DEBRIDEMENT RIGHT THIGH ABSCESS;  Surgeon: Karie SodaGross, Steven, MD;  Location: [facility];  Service: General;  Laterality: Right;  . LAPAROSCOPIC APPENDECTOMY  10/04/2011   Procedure: APPENDECTOMY LAPAROSCOPIC;  Surgeon: Rulon AbideBrian David Layton, DO;  Location: [facility];  Service: General;  Laterality: N/A;     reports that she has been smoking e-cigarettes. She has been smoking about 0.00 packs per day for the past 24.00 years. She has never used smokeless tobacco. She reports current alcohol use. She reports that she does not use drugs.  Allergies  Allergen Reactions  . Codeine Itching  . Sulfa Antibiotics Hives    Family History  Problem Relation Age of Onset  . Aneurysm Mother   . Heart attack Father   . Stroke Father   . Breast cancer Paternal Grandmother      Prior to Admission medications   Medication Sig Start Date End Date Taking? Authorizing Provider  amoxicillin-clavulanate (AUGMENTIN) 875-125 MG tablet Take 1 tablet by mouth 2 (two) times daily. 06/05/19   Marcine MatarJohnson, Deborah B, MD  atorvastatin (LIPITOR) 10 MG tablet Take 1 tablet (10 mg total) by mouth daily. Patient not  taking: Reported on 06/05/2019 06/12/18   Marcine MatarJohnson, Deborah B, MD  cyclobenzaprine (FLEXERIL) 10 MG tablet Take 1 tablet (10 mg total) by mouth 2 (two) times daily as needed for muscle spasms. Patient not taking: Reported on 12/15/2018 11/02/18   Rolan BuccoBelfi, Melanie, MD  diclofenac sodium (VOLTAREN) 1 % GEL Apply 2 g topically 4 (four) times daily. 07/05/18   Marcine MatarJohnson, Deborah B, MD  esomeprazole (NEXIUM) 20 MG capsule Take 20 mg by mouth daily.     [provider]  FLUoxetine (PROZAC) 20 MG tablet 1 tab daily witht a 60 mg tab 06/05/19   Marcine MatarJohnson, Deborah B, MD   FLUoxetine (PROZAC) 40 MG capsule Take one tab daily with a 20 mg daily 06/05/19   Marcine MatarJohnson, Deborah B, MD  fluticasone [facility](FLONASE) 50 MCG/ACT nasal spray Place 2 sprays into both nostrils daily. 12/15/18   Anders SimmondsMcClung, Angela M, PA-C  gabapentin (NEURONTIN) 300 MG capsule Take 3 capsules (900 mg total) by mouth 2 (two) times daily. 05/16/19   Claiborne RiggFleming, Zelda W, NP  glucose blood (TRUE METRIX BLOOD GLUCOSE TEST) test strip Use as instructed 01/13/18   Marcine MatarJohnson, Deborah B, MD  HYDROcodone-acetaminophen (NORCO) 5-325 MG tablet Take 1 tablet by mouth every 8 (eight) hours as needed for moderate pain. 06/05/19   Marcine MatarJohnson, Deborah B, MD  hydrOXYzine (ATARAX/VISTARIL) 50 MG tablet Take 0.5-1 tablets (25-50 mg total) by mouth 3 (three) times daily as needed. 05/16/19 06/15/19  Claiborne RiggFleming, Zelda W, NP  Insulin Detemir (LEVEMIR FLEXTOUCH) 100 UNIT/ML Pen Inject 40 Units into the skin 2 (two) times daily. 06/05/19   Marcine MatarJohnson, Deborah B, MD  Insulin Pen Needle (B-D UF III MINI PEN NEEDLES) 31G X 5 MM MISC Use as instructed. Inject into the skin three times daily 05/16/19   Claiborne RiggFleming, Zelda W, NP  liraglutide (VICTOZA) 18 MG/3ML SOPN SubQ:  Inject 0.6 mg once daily into the skin. Week 2: increase to 1.2 mg once daily; week 3: increase to 1.8 mg once daily 05/16/19   Claiborne RiggFleming, Zelda W, NP  metFORMIN (GLUCOPHAGE-XR) 500 MG 24 hr tablet Take 2 tablets (1,000 mg total) by mouth daily with breakfast. Take 2 tablets twice a day. 06/05/19   Marcine MatarJohnson, Deborah B, MD  triamcinolone cream (KENALOG) 0.1 % Apply 1 application topically 2 (two) times daily. 05/16/19   Claiborne RiggFleming, Zelda W, NP  TRUEPLUS SAFETY LANCETS 28G MISC Use as directed 01/13/18   Marcine MatarJohnson, Deborah B, MD    Physical Exam: Vitals:   06/13/19 1900 06/13/19 2030 06/13/19 2153 06/13/19 2251  BP: (!) 132/96  134/84 (!) 144/92  Pulse: 82 84 82 84  Resp: 18 17 19    Temp:    98.3 F (36.8 C)  TempSrc:    Oral  SpO2: 93% 93% 97% 98%  Weight:      Height:          Constitutional:  NAD, calm, comfortable Vitals:   06/13/19 1900 06/13/19 2030 06/13/19 2153 06/13/19 2251  BP: (!) 132/96  134/84 (!) 144/92  Pulse: 82 84 82 84  Resp: 18 17 19    Temp:    98.3 F (36.8 C)  TempSrc:    Oral  SpO2: 93% 93% 97% 98%  Weight:      Height:       Eyes: PERRL, lids and conjunctivae normal ENMT: Mucous membranes are moist. Posterior pharynx clear of any exudate or lesions.Normal dentition.  Neck: normal, supple, no masses, no thyromegaly Respiratory: clear to auscultation bilaterally, no wheezing, no crackles. Normal respiratory  effort. No accessory muscle use.  Cardiovascular: Regular rate and rhythm, no murmurs / rubs / gallops. No extremity edema. 2+ pedal pulses. No carotid bruits.  Abdomen: no tenderness, no masses palpated. No hepatosplenomegaly. Bowel sounds positive.  Musculoskeletal: no clubbing / cyanosis. No joint deformity upper and lower extremities. Good ROM, no contractures. Normal muscle tone.  Skin: no rashes, lesions, ulcers. No induration Neurologic: CN 2-12 grossly intact. Sensation intact, DTR normal. Strength 5/5 in all 4.  Psychiatric: Normal judgment and insight. Alert and oriented x 3. Normal mood.     Labs on Admission: I have personally reviewed following labs and imaging studies  CBC: Recent Labs  Lab 06/13/19 1319  WBC 8.1  HGB 14.8  HCT 46.2*  MCV 85.4  PLT 626   Basic Metabolic Panel: Recent Labs  Lab 06/13/19 1319  NA 135  K 4.2  CL 97*  CO2 26  GLUCOSE 233*  BUN 18  CREATININE 0.98  CALCIUM 9.4   GFR: Estimated Creatinine Clearance: 87.9 mL/min (by C-G formula based on SCr of 0.98 mg/dL). Liver Function Tests: No results for input(s): AST, ALT, ALKPHOS, BILITOT, PROT, ALBUMIN in the last 168 hours. No results for input(s): LIPASE, AMYLASE in the last 168 hours. No results for input(s): AMMONIA in the last 168 hours. Coagulation Profile: No results for input(s): INR, PROTIME in the last 168 hours. Cardiac Enzymes: No  results for input(s): CKTOTAL, CKMB, CKMBINDEX, TROPONINI in the last 168 hours. BNP (last 3 results) No results for input(s): PROBNP in the last 8760 hours. HbA1C: No results for input(s): HGBA1C in the last 72 hours. CBG: No results for input(s): GLUCAP in the last 168 hours. Lipid Profile: No results for input(s): CHOL, HDL, LDLCALC, TRIG, CHOLHDL, LDLDIRECT in the last 72 hours. Thyroid Function Tests: No results for input(s): TSH, T4TOTAL, FREET4, T3FREE, THYROIDAB in the last 72 hours. Anemia Panel: No results for input(s): VITAMINB12, FOLATE, FERRITIN, TIBC, IRON, RETICCTPCT in the last 72 hours. Urine analysis:    Component Value Date/Time   COLORURINE YELLOW 11/02/2018 1246   APPEARANCEUR HAZY (A) 11/02/2018 1246   LABSPEC 1.010 11/02/2018 1246   PHURINE 6.5 11/02/2018 1246   GLUCOSEU NEGATIVE 11/02/2018 1246   HGBUR NEGATIVE 11/02/2018 1246   BILIRUBINUR NEGATIVE 11/02/2018 1246   BILIRUBINUR Negative 02/09/2018 1412   KETONESUR NEGATIVE 11/02/2018 1246   PROTEINUR NEGATIVE 11/02/2018 1246   UROBILINOGEN 1.0 02/09/2018 1412   UROBILINOGEN 0.2 12/12/2013 1608   NITRITE NEGATIVE 11/02/2018 1246   LEUKOCYTESUR TRACE (A) 11/02/2018 1246   Sepsis Labs: @LABRCNTIP (procalcitonin:4,lacticidven:4) ) Recent Results (from the past 240 hour(s))  SARS Coronavirus 2 by RT PCR (hospital order, performed in [place] hospital lab) Nasopharyngeal Nasopharyngeal Swab     Status: None   Collection Time: 06/13/19  5:38 PM   Specimen: Nasopharyngeal Swab  Result Value Ref Range Status   SARS Coronavirus 2 NEGATIVE NEGATIVE Final    Comment: (NOTE) If result is NEGATIVE SARS-CoV-2 target nucleic acids are NOT DETECTED. The SARS-CoV-2 RNA is generally detectable in upper and lower  respiratory specimens during the acute phase of infection. The lowest  concentration of SARS-CoV-2 viral copies this assay can detect is 250  copies / mL. A negative result does not preclude SARS-CoV-2  infection  and should not be used as the sole basis for treatment or other  patient management decisions.  A negative result may occur with  improper specimen collection / handling, submission of specimen other  than nasopharyngeal swab, presence of  viral mutation(s) within the  areas targeted by this assay, and inadequate number of viral copies  (<250 copies / mL). A negative result must be combined with clinical  observations, patient history, and epidemiological information. If result is POSITIVE SARS-CoV-2 target nucleic acids are DETECTED. The SARS-CoV-2 RNA is generally detectable in upper and lower  respiratory specimens dur ing the acute phase of infection.  Positive  results are indicative of active infection with SARS-CoV-2.  Clinical  correlation with patient history and other diagnostic information is  necessary to determine patient infection status.  Positive results do  not rule out bacterial infection or co-infection with other viruses. If result is PRESUMPTIVE POSTIVE SARS-CoV-2 nucleic acids MAY BE PRESENT.   A presumptive positive result was obtained on the submitted specimen  and confirmed on repeat testing.  While 2019 novel coronavirus  (SARS-CoV-2) nucleic acids may be present in the submitted sample  additional confirmatory testing may be necessary for epidemiological  and / or clinical management purposes  to differentiate between  SARS-CoV-2 and other Sarbecovirus currently known to infect humans.  If clinically indicated additional testing with an alternate test  methodology [phone]) is advised. The SARS-CoV-2 RNA is generally  detectable in upper and lower respiratory sp ecimens during the acute  phase of infection. The expected result is Negative. Fact Sheet for Patients:  BoilerBrush.com.cy Fact Sheet for Healthcare Providers: https://pope.com/ This test is not yet approved or cleared by the [place]  FDA and has been authorized for detection and/or diagnosis of SARS-CoV-2 by FDA under an Emergency Use Authorization (EUA).  This EUA will remain in effect (meaning this test can be used) for the duration of the COVID-19 declaration under Section 564(b)(1) of the Act, 21 U.S.C. section 360bbb-3(b)(1), unless the authorization is terminated or revoked sooner. Performed at [facility], [address] [place], [place] [address]      Radiological Exams on Admission: Dg Chest 2 View  Result Date: 06/13/2019 CLINICAL DATA:  Chest pain EXAM: CHEST - 2 VIEW COMPARISON:  02/15/2018 FINDINGS: The heart size and mediastinal contours are within normal limits. Both lungs are clear. The visualized skeletal structures are unremarkable. IMPRESSION: No acute abnormality of the lungs. Electronically Signed   By: Lauralyn Primes M.D.   On: 06/13/2019 13:38    EKG: Independently reviewed.  It shows sinus rhythm with a rate of 82, normal intervals, no significant ST change  Assessment/Plan Principal Problem:   Chest pain Active Problems:   Uncontrolled type 2 diabetes mellitus with hyperosmolar nonketotic hyperglycemia (HCC)   HTN (hypertension)   Smoker   OSA (obstructive sleep apnea)     #1 chest pain: Patient has classic chest pain on arrival which is resolved.  Has significant risk factors for coronary artery disease.  Patient will be admitted to telemetry bed.  Complete enzymes.  Evaluate patient in the morning for possible cardiac stress test  #2 diabetes: Sliding scale insulin with home regimen.  Blood sugar is currently uncontrolled.  #3 hypertension: Better controlled.  Resume home regimen and monitor  #4 obstructive sleep apnea: Continue with CPAP at night  #5 tobacco abuse: Counseling provided and will offer nicotine patch   DVT prophylaxis: Lovenox Code Status: Full code Family Communication: No family at bedside Disposition Plan: To be determined Consults called:  Consult cardiology in the morning Admission status: Observation  Severity of Illness: The appropriate patient status for this patient is OBSERVATION. Observation status is judged to be reasonable and necessary  in order to provide the required intensity of service to ensure the patient's safety. The patient's presenting symptoms, physical exam findings, and initial radiographic and laboratory data in the context of their medical condition is felt to place them at decreased risk for further clinical deterioration. Furthermore, it is anticipated that the patient will be medically stable for discharge from the hospital within 2 midnights of admission. The following factors support the patient status of observation.   " The patient's presenting symptoms include chest. " The physical exam findings include no significant finding on exam. " The initial radiographic and laboratory data are normal enzymes.     Lonia Blood MD [facility] Pager [phone]  If 7PM-7AM, please contact night-coverage [email or web address] Password [email or web address]  06/13/2019, 11:41 PM

## 2019-06-14 ENCOUNTER — Observation Stay (HOSPITAL_BASED_OUTPATIENT_CLINIC_OR_DEPARTMENT_OTHER): Payer: Self-pay

## 2019-06-14 DIAGNOSIS — R079 Chest pain, unspecified: Secondary | ICD-10-CM

## 2019-06-14 DIAGNOSIS — I10 Essential (primary) hypertension: Secondary | ICD-10-CM

## 2019-06-14 DIAGNOSIS — F17200 Nicotine dependence, unspecified, uncomplicated: Secondary | ICD-10-CM

## 2019-06-14 DIAGNOSIS — I200 Unstable angina: Secondary | ICD-10-CM

## 2019-06-14 DIAGNOSIS — E1100 Type 2 diabetes mellitus with hyperosmolarity without nonketotic hyperglycemic-hyperosmolar coma (NKHHC): Secondary | ICD-10-CM

## 2019-06-14 LAB — CREATININE, SERUM
Creatinine, Ser: 0.97 mg/dL (ref 0.44–1.00)
GFR calc Af Amer: >60 mL/min (ref >60)
GFR calc non Af Amer: >60 mL/min (ref >60)

## 2019-06-14 LAB — GLUCOSE, CAPILLARY
Glucose-Capillary: 140 mg/dL — ABNORMAL HIGH (ref 70–99)
Glucose-Capillary: 213 mg/dL — ABNORMAL HIGH (ref 70–99)
Glucose-Capillary: 115 mg/dL — ABNORMAL HIGH (ref 70–99)
Glucose-Capillary: 55 mg/dL — ABNORMAL LOW (ref 70–99)
Glucose-Capillary: 257 mg/dL — ABNORMAL HIGH (ref 70–99)
Glucose-Capillary: 329 mg/dL — ABNORMAL HIGH (ref 70–99)

## 2019-06-14 LAB — CBC
HCT: 41.2 % (ref 36.0–46.0)
Hemoglobin: 14 g/dL (ref 12.0–15.0)
MCH: 28.5 pg (ref 26.0–34.0)
MCHC: 34 g/dL (ref 30.0–36.0)
MCV: 83.9 fL (ref 80.0–100.0)
Platelets: 230 10*3/uL (ref 150–400)
RBC: 4.91 MIL/uL (ref 3.87–5.11)
RDW: 12.3 % (ref 11.5–15.5)
WBC: 7.2 10*3/uL (ref 4.0–10.5)
nRBC: 0 % (ref 0.0–0.2)

## 2019-06-14 LAB — LIPID PANEL
Cholesterol: 307 mg/dL — ABNORMAL HIGH (ref 0–200)
HDL: 56 mg/dL (ref >40)
LDL Cholesterol: 204 mg/dL — ABNORMAL HIGH (ref 0–99)
Total CHOL/HDL Ratio: 5.5 RATIO
Triglycerides: 237 mg/dL — ABNORMAL HIGH (ref <150)
VLDL: 47 mg/dL — ABNORMAL HIGH (ref 0–40)

## 2019-06-14 LAB — TROPONIN I (HIGH SENSITIVITY)
Troponin I (High Sensitivity): <2 ng/L (ref <18)
Troponin I (High Sensitivity): 3 ng/L (ref <18)

## 2019-06-14 LAB — HIV ANTIBODY (ROUTINE TESTING W REFLEX): HIV Screen 4th Generation wRfx: NONREACTIVE

## 2019-06-14 MED ORDER — DEXTROSE 50 % IV SOLN
12.5000 g | INTRAVENOUS | Status: AC
  Administered 2019-06-14: 12:00:00 12.5 g via INTRAVENOUS

## 2019-06-14 MED ORDER — ASPIRIN EC 81 MG PO TBEC
81.0000 mg | DELAYED_RELEASE_TABLET | Freq: Every day | ORAL | Status: DC
  Administered 2019-06-14 – 2019-06-15 (×2): 81 mg via ORAL
  Filled 2019-06-14 (×2): qty 1

## 2019-06-14 MED ORDER — METOPROLOL TARTRATE 100 MG PO TABS
100.0000 mg | ORAL_TABLET | Freq: Once | ORAL | Status: AC
  Administered 2019-06-14: 100 mg via ORAL
  Filled 2019-06-14: qty 1

## 2019-06-14 MED ORDER — DEXTROSE 50 % IV SOLN
INTRAVENOUS | Status: AC
  Administered 2019-06-14: 12.5 g via INTRAVENOUS
  Filled 2019-06-14: qty 50

## 2019-06-14 MED ORDER — INSULIN ASPART 100 UNIT/ML ~~LOC~~ SOLN
0.0000 [IU] | SUBCUTANEOUS | Status: DC
  Administered 2019-06-14: 5 [IU] via SUBCUTANEOUS

## 2019-06-14 MED ORDER — ATORVASTATIN CALCIUM 80 MG PO TABS
80.0000 mg | ORAL_TABLET | Freq: Every day | ORAL | Status: DC
  Administered 2019-06-14: 80 mg via ORAL
  Filled 2019-06-14: qty 1

## 2019-06-14 NOTE — Progress Notes (Signed)
  Echocardiogram 2D Echocardiogram has been performed.  [name] 06/14/2019, 3:38 PM

## 2019-06-14 NOTE — Consult Note (Addendum)
Cardiology Consultation:   Patient ID: [name]; [id number]; [birth year]   Admit date: 06/13/2019 Date of Consult: 06/14/2019  Primary Care Provider: Ladell Pier, MD Primary Cardiologist: N/A Primary Electrophysiologist:  N/A   Patient Profile:   [name] is a 48 y.o. female with a hx of RA, T2DM, E-cigarette use, HLD, HTN who is being seen today for the evaluation of chest pain at the request of Dr.Garba  History of Present Illness:   [name] was examined and evaluated at bedside this PM. She was in her usual state of health until early yesterday morning she experienced sub-sternal chest pressure originating in her right chest that radiated to left arm and neck. Described as someone 'pushing on it.' 4/10 severity at rest. 6/10 with exertion. Alleviated at rest. She went to ED for evaluation and received nitroglycerin with resolution of her chest pain. She is currently chest pain free. Denies any fevers, chills, nausea, vomiting, diarrhea, palpitations, dyspnea. Did endorse some light-headedness.  She also mentions significant family hx of cardiac disease. Including father who required CABG at age 60 and sister who also required CABG around her age.  She also mentions prior stress test and left heart cath 'years ago, maybe in 2008' She states no intervention was done at that time. 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   Past Medical History:  Diagnosis Date  . Arthritis    fingers  . Cervical dysplasia   . Depression   . Diabetes mellitus   . Dyspnea 11/22/2013  . GERD (gastroesophageal reflux disease)   . High cholesterol   . Hypertension    no meds now  . Kidney stones   . PONV (postoperative nausea and vomiting)   . Sleep apnea    lost 70lbs no cpap x50yrs now    Past Surgical History:  Procedure Laterality Date  .  CARDIAC CATHETERIZATION     4-63yrs ago  . CERVICAL CONIZATION W/BX N/A 05/04/2018   Procedure: CONIZATION CERVIX WITH BIOPSY - COLD KNIFE;  Surgeon: Chancy Milroy, MD;  Location: [facility];  Service: Gynecology;  Laterality: N/A;  . CHOLECYSTECTOMY    . ENDOMETRIAL ABLATION     6 years ago  . IRRIGATION AND DEBRIDEMENT ABSCESS Right 03/17/2017   Procedure: IRRIGATION AND DEBRIDEMENT RIGHT THIGH ABSCESS;  Surgeon: Michael Boston, MD;  Location: [facility];  Service: General;  Laterality: Right;  . LAPAROSCOPIC APPENDECTOMY  10/04/2011   Procedure: APPENDECTOMY LAPAROSCOPIC;  Surgeon: Judieth Keens, DO;  Location: [facility];  Service: General;  Laterality: N/A;     Home Medications:  Prior to Admission medications   Medication Sig Start Date End Date Taking? Authorizing Provider  amoxicillin-clavulanate (AUGMENTIN) 875-125 MG tablet Take 1 tablet by mouth 2 (two) times daily. 06/05/19  Yes Ladell Pier, MD  diclofenac sodium (VOLTAREN) 1 % GEL Apply 2 g topically 4 (four) times daily. 07/05/18  Yes Ladell Pier, MD  esomeprazole (NEXIUM) 20 MG capsule Take 20 mg by mouth daily.    Yes [provider]  FLUoxetine (PROZAC) 20 MG tablet 1 tab daily witht a 60 mg tab Patient taking differently: Take 20 mg by mouth daily. (take with 40mg  to equal 60mg  total) 06/05/19  Yes Ladell Pier, MD  FLUoxetine (PROZAC) 40 MG capsule Take one tab daily with a 20 mg daily Patient taking differently: Take  40 mg by mouth daily. (take with 20mg  to equal 60mg  total) 06/05/19  Yes , MD  fluticasone [facility]) 50 MCG/ACT nasal spray Place 2 sprays into both nostrils daily. 12/15/18  Yes [facility], PA-C  gabapentin (NEURONTIN) 300 MG capsule Take 3 capsules (900 mg total) by mouth 2 (two) times daily. 05/16/19  Yes Anders Simmonds, NP  glucose blood (TRUE METRIX BLOOD GLUCOSE TEST) test strip Use as instructed 01/13/18  Yes Claiborne Rigg, MD   HYDROcodone-acetaminophen (NORCO) 5-325 MG tablet Take 1 tablet by mouth every 8 (eight) hours as needed for moderate pain. 06/05/19  Yes Marcine Matar, MD  hydrOXYzine (ATARAX/VISTARIL) 50 MG tablet Take 0.5-1 tablets (25-50 mg total) by mouth 3 (three) times daily as needed. Patient taking differently: Take 25-50 mg by mouth 3 (three) times daily as needed for anxiety or itching.  05/16/19 06/15/19 Yes 05/18/19, NP  Insulin Detemir (LEVEMIR FLEXTOUCH) 100 UNIT/ML Pen Inject 40 Units into the skin 2 (two) times daily. 06/05/19  Yes Claiborne Rigg, MD  Insulin Pen Needle (B-D UF III MINI PEN NEEDLES) 31G X 5 MM MISC Use as instructed. Inject into the skin three times daily 05/16/19  Yes Marcine Matar, NP  liraglutide (VICTOZA) 18 MG/3ML SOPN SubQ:  Inject 0.6 mg once daily into the skin. Week 2: increase to 1.2 mg once daily; week 3: increase to 1.8 mg once daily Patient taking differently: Inject 1.8 mg into the skin daily.  05/16/19  Yes Claiborne Rigg, NP  metFORMIN (GLUCOPHAGE-XR) 500 MG 24 hr tablet Take 2 tablets (1,000 mg total) by mouth daily with breakfast. Take 2 tablets twice a day. Patient taking differently: Take 1,000 mg by mouth daily with breakfast.  06/05/19  Yes Claiborne Rigg, MD  triamcinolone cream (KENALOG) 0.1 % Apply 1 application topically 2 (two) times daily. 05/16/19  Yes Marcine Matar, NP  TRUEPLUS SAFETY LANCETS 28G MISC Use as directed 01/13/18  Yes Claiborne Rigg, MD    Inpatient Medications: Scheduled Meds: . diclofenac sodium  2 g Topical TID AC & HS  . enoxaparin (LOVENOX) injection  40 mg Subcutaneous Daily  . FLUoxetine  60 mg Oral Daily  . fluticasone  2 spray Each Nare Daily  . gabapentin  900 mg Oral BID  . insulin aspart  0-15 Units Subcutaneous Q4H  . insulin detemir  40 Units Subcutaneous BID  . pantoprazole  40 mg Oral Daily  . triamcinolone cream  1 application Topical BID   Continuous Infusions:  PRN Meds:  acetaminophen, HYDROcodone-acetaminophen, hydrOXYzine, nitroGLYCERIN, ondansetron (ZOFRAN) IV  Allergies:    Allergies  Allergen Reactions  . Codeine Itching  . Sulfa Antibiotics Hives    Social History:   Social History   Socioeconomic History  . Marital status: Widowed    Spouse name: Not on file  . Number of children: 1  . Years of education: Not on file  . Highest education level: Not on file  Occupational History  . Occupation: none  Social Needs  . Financial resource strain: Not on file  . Food insecurity    Worry: Not on file    Inability: Not on file  . Transportation needs    Medical: Not on file    Non-medical: Not on file  Tobacco Use  . Smoking status: Current Every Day Smoker    Packs/day: 0.00    Years: 24.00    Pack years: 0.00  Types: E-cigarettes  . Smokeless tobacco: Never Used  Substance and Sexual Activity  . Alcohol use: Yes    Comment: rare  . Drug use: No  . Sexual activity: Yes    Birth control/protection: None  Lifestyle  . Physical activity    Days per week: Not on file    Minutes per session: Not on file  . Stress: Not on file  Relationships  . Social Musician on phone: Not on file    Gets together: Not on file    Attends religious service: Not on file    Active member of club or organization: Not on file    Attends meetings of clubs or organizations: Not on file    Relationship status: Not on file  . Intimate partner violence    Fear of current or ex partner: Not on file    Emotionally abused: Not on file    Physically abused: Not on file    Forced sexual activity: Not on file  Other Topics Concern  . Not on file  Social History Narrative  . Not on file    Family History:   Father w/ triple bypass at age 33. Sister w/ double bypass around that age as well  Family History  Problem Relation Age of Onset  . Aneurysm Mother   . Heart attack Father   . Stroke Father   . Breast cancer Paternal Grandmother       ROS:  Please see the history of present illness.  Review of Systems  Constitution: Negative for chills and fever.  Cardiovascular: Positive for chest pain. Negative for leg swelling and palpitations.  Respiratory: Negative for shortness of breath and wheezing.   Gastrointestinal: Negative for abdominal pain, constipation and diarrhea.    All other ROS reviewed and negative.     Physical Exam/Data:   Vitals:   06/13/19 2251 06/14/19 0500 06/14/19 0901 06/14/19 1140  BP: (!) 144/92 115/83 (!) 114/91 (!) 157/85  Pulse: 84 89  85  Resp:  15    Temp: 98.3 F (36.8 C) 98 F (36.7 C)  97.7 F (36.5 C)  TempSrc: Oral Oral  Oral  SpO2: 98% 97%  98%  Weight:      Height:        Intake/Output Summary (Last 24 hours) at 06/14/2019 1322 Last data filed at 06/14/2019 1202 Gross per 24 hour  Intake 480 ml  Output 300 ml  Net 180 ml   Filed Weights   06/13/19 1257  Weight: 103.4 kg   Body mass index is 33.67 kg/m.   Gen: Well-developed, well nourished, NAD HEENT: NCAT head, hearing intact, EOMI Neck: supple, ROM intact, no JVD CV: RRR, S1, S2 normal, No rubs, no murmurs, no chest wall tenderness Pulm: CTAB, No rales, no wheezes Abd: Soft, BS+, NTND Extm: ROM intact, Peripheral pulses intact, No peripheral edema Skin: Dry, Warm, normal turgor, no rashes, lesions, wounds.   EKG:  The EKG was personally reviewed and demonstrates:  Normal sinus, normal axis, no ST changes.  Telemetry:  Telemetry was personally reviewed and demonstrates:  Normal sinus with intermittent sinus tachycardia  Relevant CV Studies:  10/31/2010 MYOCARDIAL IMAGING WITH SPECT (REST AND EXERCISE) GATED LEFT VENTRICULAR WALL MOTION STUDY  Technique:  Standard myocardial SPECT imaging was performed after resting intravenous injection of  10 mCi Tc-70m tetrofosmin. Subsequently, exercise tolerance test was performed by the patient under the supervision of the Cardiology staff.  At peak-stress, 30  mCi  Tc-7855m tetrofosminwas injected intravenously and standard myocardial SPECT imaging was performed.  Quantitative gated imaging was also performed to evaluate left ventricular wall motion, and estimate left ventricular ejection fraction.  Comparison:  None.  Findings: Patient walked for 9:08 on standard Bruce protocol. Stopped due to fatigue and chest pressure. Pre-test 5/10 CP increased to 8/10 during test. Resting HR 77. Peak HR 162. Peak BP 129/89. No significant ST-T changes with exercise.  Perfusion imaging normal at stress and exercise.  Post-exercise gated SPECT. EF 71% with no wall motion abnormalities. EDV 48 cc. ESV 14 cc.  IMPRESSION: Normal exercise Myoview with no evidence of infarct or ischemia.  07/29/2006  CORONARY ANGIOGRAPHY:  There is no true left main stem.  The LAD has a separate ostium.  The LAD is a large-diameter vessel coursing down to the left ventricular apex; it gives off a large first and second diagonal branch and the second diagonal branch bifurcates into twin vessels.  There is 1 large septal perforator.  There is no significant obstructive disease throughout the LAD system.   The right coronary artery is dominant.  It gives off an acute marginal branch in its midportion as well as a PDA and 2 posterolateral segments.  There is no significant disease throughout the right coronary artery. There was spasm just off the end of the catheter on the second  angiographic image.  This was clearly normal on other images.   There is an anomalous left circumflex that takes off from the right coronary cusp.  It has a very anteriorly directed takeoff.  As above, I  attempted to image this with multiple catheters and was not able to selectively engage this vessel.  Its proximal portions are clearly  normal, but I am unable to visualize the distal portion of the vessel.   LEFT VENTRICULOGRAM:  Performed in the 30-degree RAO projection  demonstrates normal  left ventricular function with an estimated left  ventricular ejection fraction of 60%.   ASSESSMENT:  1. No obstructive coronary artery disease.  2. 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  3. Normal left ventricular function.  Laboratory Data:  Chemistry Recent Labs  Lab 06/13/19 1319 06/14/19 0009  NA 135  --   K 4.2  --   CL 97*  --   CO2 26  --   GLUCOSE 233*  --   BUN 18  --   CREATININE 0.98 0.97  CALCIUM 9.4  --   GFRNONAA >60 >60  GFRAA >60 >60  ANIONGAP 12  --     No results for input(s): PROT, ALBUMIN, AST, ALT, ALKPHOS, BILITOT in the last 168 hours. Hematology Recent Labs  Lab 06/13/19 1319 06/14/19 0009  WBC 8.1 7.2  RBC 5.41* 4.91  HGB 14.8 14.0  HCT 46.2* 41.2  MCV 85.4 83.9  MCH 27.4 28.5  MCHC 32.0 34.0  RDW 12.2 12.3  PLT 259 230   Cardiac EnzymesNo results for input(s): TROPONINI in the last 168 hours. No results for input(s): TROPIPOC in the last 168 hours.  BNPNo results for input(s): BNP, PROBNP in the last 168 hours.  DDimer No results for input(s): DDIMER in the last 168 hours.  Radiology/Studies:  Dg Chest 2 View  Result Date: 06/13/2019 CLINICAL DATA:  Chest pain EXAM: CHEST - 2 VIEW COMPARISON:  02/15/2018 FINDINGS: The heart size and mediastinal contours are within normal limits. Both lungs are clear. The visualized skeletal structures are unremarkable. IMPRESSION: No acute abnormality of the lungs. Electronically  Signed   By: Lauralyn PrimesAlex  Bibbey M.D.   On: 06/13/2019 13:38    Assessment and Plan:   Unstable Angina Presents w/ typical angina. 3/3 on diamond. Heart Score of 5 due to risk factors: Hgb a1c 12. LDL >200. EKG w/o ST changes. HsTrop neg x2. Significant family history of cardiac disease. Chart review shows most recent stress test in 2012 w/o perfusion defect. Cath in 2007 shows non-obstructive CAD. - Enough risk factors to warrant stress test - C/w telemetery - C/w Aspirin - Echocardiogram - Nitro PRN for  pain  T2DM hgb a1c on 06/2019 >12 - C/w Levemir 40 BID - SSI - Glucose checks - Need better control after discharge  HLD Not on statin at home. LDL >200 - Start atorvastatin 80mg  - Will need to continue at discharge  For questions or updates, please contact [facility] Please consult [email or web address] for contact info under Cardiology/STEMI.   Signed, Judeth CornfieldJoshua Lee, MD PGY-2, [facility] Pager: [phone] 06/14/2019 1:22 PM  Attending attestation to follow   I have seen and examined the patient along with Judeth CornfieldJoshua Lee, MD.  I have reviewed the chart, notes and new data.  I agree with his note.  Key new complaints: Pressure-like chest pain occurred at rest, lasted several hours, promptly Nitroglycerin-responsive .  Multiple coronary risk factors including history of cigarette smoking/current vaping, poorly controlled diabetes mellitus, hypercholesterolemia, family history of premature onset CAD. Key examination changes: Obesity.  Normal cardiovascular exam. Key new findings / data: Low risk ECG and troponin levels.  History of normal nuclear stress test in thousand and 12 and no evidence of coronary artery disease by catheterization in 2006 (incidental note of anomalous left circumflex taking off the right coronary artery, not a risk factor for coronary insufficiency.)  PLAN: Symptoms are strongly suggestive of coronary insufficiency, but no indications of high risk for acute coronary syndrome by testing.  Differential diagnosis is with esophageal spasm.  Recommend coronary CT angiography.  No indication for intravenous heparin.  Should be on aspirin.  Needs aggressive treatment of risk factor including stopping vaping, LDL cholesterol less than 70, hemoglobin A1c 6-7%.  Regardless of results of CT angiography, it is possible that she has coronary vasospasm.  Treatment with both long-acting nitrates and as needed sublingual nitroglycerin is reasonable, even if we do not identify  coronary stenoses.  If the diagnosis actually turns out to be esophageal spasm, long-acting nitrates would also be beneficial.  Thurmon FairMihai Calais Svehla, MD, [facility] [phone] 06/14/2019, 4:09 PM

## 2019-06-14 NOTE — Plan of Care (Signed)
  Problem: Clinical Measurements: Goal: Diagnostic test results will improve Outcome: Progressing Continue to cycle cardiac enzymes,   Problem: Pain Managment Goal: General experience of comfort will improve Outcome: Completed/Met   Problem: Safety: Goal: Ability to remain free from injury will improve Outcome: Completed/Met   Problem: Skin Integrity: Goal: Risk for impaired skin integrity will decrease Outcome: Completed/Met

## 2019-06-14 NOTE — Progress Notes (Addendum)
Hypoglycemic Event  CBG: 55 at 1132  Treatment:12.5mg  IV 50% dextrose  Symptoms: pale, diaphoretic, shaky  Follow-up CBG: Time: 1143 CBG Result: 115  Possible Reasons for Event: NPO  Comments/MD notified: Dr Broadus John, STATED TO CALL HER IF ANOTHER HYPOGLYCEMIC EPISODE, WILL RELAY TO [place], RN    Nechama Guard

## 2019-06-14 NOTE — Progress Notes (Signed)
Inpatient Diabetes Program Recommendations  AACE/ADA: New Consensus Statement on Inpatient Glycemic Control (2015)  Target Ranges:  Prepandial:   less than 140 mg/dL      Peak postprandial:   less than 180 mg/dL (1-2 hours)      Critically ill patients:  140 - 180 mg/dL   Lab Results  Component Value Date   GLUCAP 115 (H) 06/14/2019   HGBA1C 12.2 (A) 06/05/2019    Review of Glycemic Control Results for [name], [name] (MRN [id number]) as of 06/14/2019 13:24  Ref. Range 06/14/2019 07:37 06/14/2019 11:32 06/14/2019 11:43  Glucose-Capillary Latest Ref Range: 70 - 99 mg/dL 213 (H) 55 (L) 115 (H)   Diabetes history: Type 2 DM Outpatient Diabetes medications: Victoza 1.8 mg QD, Metformin 1000 mg BID, Levemir 40 units BID Current orders for Inpatient glycemic control: Levemir 40 units BID, Novolog 0-15 units Q4H  Inpatient Diabetes Program Recommendations:     Noted hypoglycemic episode of 55 mg/dL. Would recommend decreasing correction to Novolog 0-9 units Q4H while NPO. Once diet order placed, Consider Novolog 0-9 units TID. Of note, insulin MUST be given within one hour of last CBG to avoid hypoglycemia.   Spoke with patient regarding DM management. Is followed outpatient at [facility] and was recently seen on 10/05. Per MD note, patient has been struggling to remember evening dose of Levemir.  Reviewed patient's current A1c of 12.2%. Explained what a A1c is and what it measures. Also reviewed goal A1c with patient, importance of good glucose control @ home, and blood sugar goals. Reviewed patho of DM, need for insulin, role of pancreas, vascular changes and comorbidities. Patient has a meter and supplies. States, "Since that appointment, I have been doing much better at remembering my scheduled injections and my CBGs have ranged in the 180's mg/dL." Patient is scheduled for a follow up on Friday. Patient has no further questions at this time.   Thanks, Bronson Curb, MSN, RNC-OB Diabetes  Coordinator [phone] (8a-5p)

## 2019-06-14 NOTE — Progress Notes (Signed)
PROGRESS NOTE    [name]  [id number] DOB: 1971-05-11 DOA: 06/13/2019 PCP: Ladell Pier, MD  Brief Narrative: 48 year old female with history of type 2 diabetes mellitus, uncontrolled, RA, hypertension, dyslipidemia, anxiety was admitted last night with complaints of chest pain/pressure which started yesterday morning. -This was throbbing, radiating down her left arm and jaw, occasionally pressure-like, relieved following nitroglycerin x1  Assessment & Plan:   Atypical chest pain with some typical features -EKG unremarkable, high-sensitivity troponinx 3 is negative -Due to extensive list of risk factors namely uncontrolled diabetes, hypertension, dyslipidemia, strong family history of premature CAD cardiology was consulted for consideration of ischemia evaluation -We will make her n.p.o.  Uncontrolled type 2 diabetes mellitus with hyperglycemia -CBGs are uncontrolled, hemoglobin A1c is 12 12 -Transient hypoglycemia this morning, following Levemir administration, will monitor  Dyslipidemia -LDL is greater than 200, she is supposed to be on Lipitor at home but is not taking this, -We will resume this at a higher dose  Obstructive sleep apnea -Continue CPAP nightly  Tobacco abuse -Currently smoking E cigarettes  DVT prophylaxis: Lovenox Code Status: Full code Family Communication: No family at bedside Disposition Plan: Home  pending cardiology evaluation if work-up is unremarkable  Consultants:   Cardiology   Procedures:   Antimicrobials:    Subjective: -Denies any chest pain now, reports headache this morning  Objective: Vitals:   06/13/19 2251 06/14/19 0500 06/14/19 0901 06/14/19 1140  BP: (!) 144/92 115/83 (!) 114/91 (!) 157/85  Pulse: 84 89  85  Resp:  15    Temp: 98.3 F (36.8 C) 98 F (36.7 C)  97.7 F (36.5 C)  TempSrc: Oral Oral  Oral  SpO2: 98% 97%  98%  Weight:      Height:        Intake/Output Summary (Last 24 hours) at  06/14/2019 1330 Last data filed at 06/14/2019 1202 Gross per 24 hour  Intake 480 ml  Output 300 ml  Net 180 ml   Filed Weights   06/13/19 1257  Weight: 103.4 kg    Examination:  General exam: Appears calm and comfortable  Respiratory system: Clear to auscultation.. Cardiovascular system: S1 & S2 heard, RRR Gastrointestinal system: Abdomen is nondistended, soft and nontender.Normal bowel sounds heard. Central nervous system: Alert and oriented. No focal neurological deficits. Extremities: no edema Skin: No rashes, lesions or ulcers Psychiatry: Judgement and insight appear normal. Mood & affect appropriate.     Data Reviewed:   CBC: Recent Labs  Lab 06/13/19 1319 06/14/19 0009  WBC 8.1 7.2  HGB 14.8 14.0  HCT 46.2* 41.2  MCV 85.4 83.9  PLT 259 329   Basic Metabolic Panel: Recent Labs  Lab 06/13/19 1319 06/14/19 0009  NA 135  --   K 4.2  --   CL 97*  --   CO2 26  --   GLUCOSE 233*  --   BUN 18  --   CREATININE 0.98 0.97  CALCIUM 9.4  --    GFR: Estimated Creatinine Clearance: 88.8 mL/min (by C-G formula based on SCr of 0.97 mg/dL). Liver Function Tests: No results for input(s): AST, ALT, ALKPHOS, BILITOT, PROT, ALBUMIN in the last 168 hours. No results for input(s): LIPASE, AMYLASE in the last 168 hours. No results for input(s): AMMONIA in the last 168 hours. Coagulation Profile: No results for input(s): INR, PROTIME in the last 168 hours. Cardiac Enzymes: No results for input(s): CKTOTAL, CKMB, CKMBINDEX, TROPONINI in the last 168 hours. BNP (last 3 results)  No results for input(s): PROBNP in the last 8760 hours. HbA1C: No results for input(s): HGBA1C in the last 72 hours. CBG: Recent Labs  Lab 06/13/19 2220 06/14/19 0737 06/14/19 1132 06/14/19 1143  GLUCAP 140* 213* 55* 115*   Lipid Profile: Recent Labs    06/14/19 0009  CHOL 307*  HDL 56  LDLCALC 204*  TRIG 237*  CHOLHDL 5.5   Thyroid Function Tests: No results for input(s): TSH,  T4TOTAL, FREET4, T3FREE, THYROIDAB in the last 72 hours. Anemia Panel: No results for input(s): VITAMINB12, FOLATE, FERRITIN, TIBC, IRON, RETICCTPCT in the last 72 hours. Urine analysis:    Component Value Date/Time   COLORURINE YELLOW 11/02/2018 1246   APPEARANCEUR HAZY (A) 11/02/2018 1246   LABSPEC 1.010 11/02/2018 1246   PHURINE 6.5 11/02/2018 1246   GLUCOSEU NEGATIVE 11/02/2018 1246   HGBUR NEGATIVE 11/02/2018 1246   BILIRUBINUR NEGATIVE 11/02/2018 1246   BILIRUBINUR Negative 02/09/2018 1412   KETONESUR NEGATIVE 11/02/2018 1246   PROTEINUR NEGATIVE 11/02/2018 1246   UROBILINOGEN 1.0 02/09/2018 1412   UROBILINOGEN 0.2 12/12/2013 1608   NITRITE NEGATIVE 11/02/2018 1246   LEUKOCYTESUR TRACE (A) 11/02/2018 1246   Sepsis Labs: @LABRCNTIP (procalcitonin:4,lacticidven:4)  ) Recent Results (from the past 240 hour(s))  SARS Coronavirus 2 by RT PCR (hospital order, performed in [facility] hospital lab) Nasopharyngeal Nasopharyngeal Swab     Status: None   Collection Time: 06/13/19  5:38 PM   Specimen: Nasopharyngeal Swab  Result Value Ref Range Status   SARS Coronavirus 2 NEGATIVE NEGATIVE Final    Comment: (NOTE) If result is NEGATIVE SARS-CoV-2 target nucleic acids are NOT DETECTED. The SARS-CoV-2 RNA is generally detectable in upper and lower  respiratory specimens during the acute phase of infection. The lowest  concentration of SARS-CoV-2 viral copies this assay can detect is 250  copies / mL. A negative result does not preclude SARS-CoV-2 infection  and should not be used as the sole basis for treatment or other  patient management decisions.  A negative result may occur with  improper specimen collection / handling, submission of specimen other  than nasopharyngeal swab, presence of viral mutation(s) within the  areas targeted by this assay, and inadequate number of viral copies  (<250 copies / mL). A negative result must be combined with clinical  observations, patient  history, and epidemiological information. If result is POSITIVE SARS-CoV-2 target nucleic acids are DETECTED. The SARS-CoV-2 RNA is generally detectable in upper and lower  respiratory specimens dur ing the acute phase of infection.  Positive  results are indicative of active infection with SARS-CoV-2.  Clinical  correlation with patient history and other diagnostic information is  necessary to determine patient infection status.  Positive results do  not rule out bacterial infection or co-infection with other viruses. If result is PRESUMPTIVE POSTIVE SARS-CoV-2 nucleic acids MAY BE PRESENT.   A presumptive positive result was obtained on the submitted specimen  and confirmed on repeat testing.  While 2019 novel coronavirus  (SARS-CoV-2) nucleic acids may be present in the submitted sample  additional confirmatory testing may be necessary for epidemiological  and / or clinical management purposes  to differentiate between  SARS-CoV-2 and other Sarbecovirus currently known to infect humans.  If clinically indicated additional testing with an alternate test  methodology [phone]) is advised. The SARS-CoV-2 RNA is generally  detectable in upper and lower respiratory sp ecimens during the acute  phase of infection. The expected result is Negative. Fact Sheet for Patients:  (EQA8341 Fact Sheet  for Healthcare Providers: https://pope.com/https://www.fda.gov/media/136313/download This test is not yet approved or cleared by the [place] FDA and has been authorized for detection and/or diagnosis of SARS-CoV-2 by FDA under an Emergency Use Authorization (EUA).  This EUA will remain in effect (meaning this test can be used) for the duration of the COVID-19 declaration under Section 564(b)(1) of the Act, 21 U.S.C. section 360bbb-3(b)(1), unless the authorization is terminated or revoked sooner. Performed at [facility] [place], [address] [place], [place] [address]           Radiology Studies: Dg Chest 2 View  Result Date: 06/13/2019 CLINICAL DATA:  Chest pain EXAM: CHEST - 2 VIEW COMPARISON:  02/15/2018 FINDINGS: The heart size and mediastinal contours are within normal limits. Both lungs are clear. The visualized skeletal structures are unremarkable. IMPRESSION: No acute abnormality of the lungs. Electronically Signed   By: Lauralyn PrimesAlex  Bibbey M.D.   On: 06/13/2019 13:38        Scheduled Meds: . diclofenac sodium  2 g Topical TID AC & HS  . enoxaparin (LOVENOX) injection  40 mg Subcutaneous Daily  . FLUoxetine  60 mg Oral Daily  . fluticasone  2 spray Each Nare Daily  . gabapentin  900 mg Oral BID  . insulin aspart  0-15 Units Subcutaneous Q4H  . insulin detemir  40 Units Subcutaneous BID  . pantoprazole  40 mg Oral Daily  . triamcinolone cream  1 application Topical BID   Continuous Infusions:   LOS: 0 days    Time spent: 35min    Zannie CovePreetha Honest Vanleer, MD [facility]  06/14/2019, 1:30 PM

## 2019-06-15 ENCOUNTER — Other Ambulatory Visit: Payer: Self-pay | Admitting: Internal Medicine

## 2019-06-15 ENCOUNTER — Observation Stay (HOSPITAL_COMMUNITY): Payer: Self-pay

## 2019-06-15 DIAGNOSIS — G4733 Obstructive sleep apnea (adult) (pediatric): Secondary | ICD-10-CM

## 2019-06-15 DIAGNOSIS — R079 Chest pain, unspecified: Secondary | ICD-10-CM

## 2019-06-15 DIAGNOSIS — R072 Precordial pain: Secondary | ICD-10-CM

## 2019-06-15 LAB — ECHOCARDIOGRAM COMPLETE
Height: 69 in
Weight: 3648 oz

## 2019-06-15 MED ORDER — INSULIN ASPART 100 UNIT/ML ~~LOC~~ SOLN: 0.0000 [IU] | Freq: Every day | SUBCUTANEOUS | Status: DC

## 2019-06-15 MED ORDER — ATORVASTATIN CALCIUM 80 MG PO TABS: 80.0000 mg | ORAL_TABLET | Freq: Every day | ORAL | 0 refills | Status: DC

## 2019-06-15 MED ORDER — METOPROLOL TARTRATE 25 MG PO TABS
25.0000 mg | ORAL_TABLET | Freq: Once | ORAL | Status: AC
  Administered 2019-06-15: 25 mg via ORAL
  Filled 2019-06-15: qty 1

## 2019-06-15 MED ORDER — INSULIN ASPART 100 UNIT/ML ~~LOC~~ SOLN
0.0000 [IU] | Freq: Three times a day (TID) | SUBCUTANEOUS | Status: DC
  Administered 2019-06-15: 2 [IU] via SUBCUTANEOUS

## 2019-06-15 MED ORDER — IVABRADINE HCL 5 MG PO TABS
5.0000 mg | ORAL_TABLET | Freq: Once | ORAL | Status: AC
  Administered 2019-06-15: 5 mg via ORAL
  Filled 2019-06-15: qty 1

## 2019-06-15 MED ORDER — LEVEMIR FLEXTOUCH 100 UNIT/ML ~~LOC~~ SOPN: 50.0000 [IU] | PEN_INJECTOR | Freq: Two times a day (BID) | SUBCUTANEOUS | Status: DC

## 2019-06-15 MED ORDER — IOHEXOL 350 MG/ML SOLN
80.0000 mL | Freq: Once | INTRAVENOUS | Status: AC | PRN
  Administered 2019-06-15: 80 mL via INTRAVENOUS

## 2019-06-15 MED ORDER — NITROGLYCERIN 0.4 MG SL SUBL
SUBLINGUAL_TABLET | SUBLINGUAL | Status: AC
  Filled 2019-06-15: qty 2

## 2019-06-15 MED ORDER — NITROGLYCERIN 0.4 MG SL SUBL
0.8000 mg | SUBLINGUAL_TABLET | Freq: Once | SUBLINGUAL | Status: AC
  Administered 2019-06-15: 0.8 mg via SUBLINGUAL

## 2019-06-15 MED ORDER — ISOSORBIDE MONONITRATE ER 30 MG PO TB24: 30.0000 mg | ORAL_TABLET | Freq: Every day | ORAL | 0 refills | Status: DC

## 2019-06-15 MED ORDER — NITROGLYCERIN 0.4 MG SL SUBL: 0.4000 mg | SUBLINGUAL_TABLET | SUBLINGUAL | 0 refills | Status: DC | PRN

## 2019-06-15 MED FILL — !VICTOZA 18MG/3ML INJECT: 18 | 10 days supply | Qty: 3 | Fill #1

## 2019-06-15 MED FILL — GABAPENTIN 300 MG CAPSULE: 300 | 30 days supply | Qty: 180 | Fill #1

## 2019-06-15 MED FILL — FLUTICASONE PROP 50 MCG SPR: 50 | 30 days supply | Qty: 16 | Fill #1

## 2019-06-15 MED FILL — TRIAMCINOLONE ACETONIDE 0.1: 0.1 | 14 days supply | Qty: 45 | Fill #1

## 2019-06-15 NOTE — Progress Notes (Addendum)
Inpatient Diabetes Program Recommendations  AACE/ADA: New Consensus Statement on Inpatient Glycemic Control (2015)  Target Ranges:  Prepandial:   less than 140 mg/dL      Peak postprandial:   less than 180 mg/dL (1-2 hours)      Critically ill patients:  140 - 180 mg/dL   Lab Results  Component Value Date   GLUCAP 329 (H) 06/14/2019   HGBA1C 12.2 (A) 06/05/2019    Review of Glycemic Control Results for HILDA, RYNDERS (MRN [id number]) as of 06/15/2019 09:51  Ref. Range 06/14/2019 11:43 06/14/2019 15:57 06/14/2019 21:02  Glucose-Capillary Latest Ref Range: 70 - 99 mg/dL 115 (H) 257 (H) 329 (H)   Diabetes history: Type 2 DM Outpatient Diabetes medications: Victoza 1.8 mg QD, Metformin 1000 mg BID, Levemir 40 units BID Current orders for Inpatient glycemic control: Levemir 40 units BID,   Inpatient Diabetes Program Recommendations:     CBG at bedtime was 329 mg/dL and noted that SSI was discontinued. Consider adding back correction to Novolog 0-9 units TID. Secure chat sent to MD. Addendum: Spoke with Meredith Mody, RN regarding CBG's not displaying on trends. Plan for meter to be redocked.   Thanks, Bronson Curb, MSN, RNC-OB Diabetes Coordinator [phone] (8a-5p)

## 2019-06-15 NOTE — Discharge Instructions (Signed)

## 2019-06-15 NOTE — Discharge Summary (Signed)
Physician Discharge Summary  [name] [id number] DOB: 07-18-1971 DOA: 06/13/2019  PCP: Ladell Pier, MD  Admit date: 06/13/2019 Discharge date: 06/15/2019  Time spent: 35 minutes  Recommendations for Outpatient Follow-up:  PCP in 1 week, consider addition of SGLT2 inhibitors for uncontrolled DM with progressive CAD Cardiology, [facility] in 1 month  Discharge Diagnoses:  Principal Problem:   Left-sided chest pain   Multivessel coronary artery disease   Uncontrolled type 2 diabetes mellitus with hyperosmolar nonketotic hyperglycemia (HCC)   HTN (hypertension)   Smoker   OSA (obstructive sleep apnea)   Discharge Condition: stable  Diet recommendation: heart healthy  Filed Weights   06/13/19 1257 06/15/19 0412  Weight: 103.4 kg 102.4 kg    History of present illness:  48 year old female with history of type 2 diabetes mellitus, uncontrolled, RA, hypertension, dyslipidemia, anxiety was admitted last night with complaints of chest pain/pressure which started 10/13 morning. 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, 40% LAD lesion -Cardiology recommended medical management with intensive risk factor modification -She will be discharged home on aspirin, beta-blockers, long-acting nitrates, PRN nitroglycerin -Needs better control of diabetes, Levemir dose increased -Consider SGLT2  inhibitors at follow-up -Cardiology follow-up to be arranged at [facility]  Uncontrolled type 2 diabetes mellitus with hyperglycemia -CBGs are uncontrolled, hemoglobin A1c is 12 -Restarted Levemir, dose increased to 50 units every 12, restarted metformin thousand milligrams twice daily and Victoza, consider SGLT2 inhibitors at follow-up, educated regarding diet, weight loss etc.  Dyslipidemia -LDL is greater than 200, she is supposed to be on Lipitor at home but is not taking this, -Restarted Lipitor 80 mg daily  Obstructive sleep apnea -Continue CPAP nightly  Tobacco abuse -Currently smoking E cigarettes -  Discharge Exam: Vitals:   06/15/19 0817 06/15/19 0832  BP: 101/73 102/79  Pulse:    Resp:    Temp:    SpO2:      General: AAOx3 Cardiovascular:S1S2/RRR Respiratory: CTAB  Discharge Instructions   Discharge Instructions    Diet - low sodium heart healthy   Complete by: As directed    Diet Carb Modified   Complete by: As directed    Increase activity slowly   Complete by: As directed      Allergies as of 06/15/2019      Reactions   Codeine Itching   Sulfa Antibiotics Hives      Medication List    STOP taking these medications   amoxicillin-clavulanate 875-125 MG tablet Commonly known as: Augmentin     TAKE these medications   atorvastatin 80 MG tablet Commonly known as: LIPITOR Take 1 tablet (80 mg total) by mouth daily at 6 PM.   B-D UF III MINI PEN NEEDLES 31G X 5 MM Misc Generic drug: Insulin Pen Needle Use as instructed. Inject into the skin three times daily   diclofenac sodium 1 % Gel Commonly known as: VOLTAREN Apply 2 g topically 4 (four) times daily.   esomeprazole 20 MG capsule Commonly known as: NEXIUM Take  20 mg by mouth daily.   FLUoxetine 40 MG capsule Commonly known as: PROZAC Take one tab daily with a 20 mg daily What changed:   how much to take  how to take this  when to take this  additional  instructions   FLUoxetine 20 MG tablet Commonly known as: PROZAC 1 tab daily witht a 60 mg tab What changed:   how much to take  how to take this  when to take this  additional instructions   fluticasone 50 MCG/ACT nasal spray Commonly known as: FLONASE Place 2 sprays into both nostrils daily.   gabapentin 300 MG capsule Commonly known as: NEURONTIN Take 3 capsules (900 mg total) by mouth 2 (two) times daily.   glucose blood test strip Commonly known as: True Metrix Blood Glucose Test Use as instructed   HYDROcodone-acetaminophen 5-325 MG tablet Commonly known as: Norco Take 1 tablet by mouth every 8 (eight) hours as needed for moderate pain.   hydrOXYzine 50 MG tablet Commonly known as: ATARAX/VISTARIL Take 0.5-1 tablets (25-50 mg total) by mouth 3 (three) times daily as needed. What changed: reasons to take this   isosorbide mononitrate 30 MG 24 hr tablet Commonly known as: IMDUR Take 1 tablet (30 mg total) by mouth daily.   Levemir FlexTouch 100 UNIT/ML Pen Generic drug: Insulin Detemir Inject 50 Units into the skin 2 (two) times daily. What changed: how much to take   metFORMIN 500 MG 24 hr tablet Commonly known as: GLUCOPHAGE-XR Take 2 tablets (1,000 mg total) by mouth daily with breakfast. Take 2 tablets twice a day. What changed: additional instructions   nitroGLYCERIN 0.4 MG SL tablet Commonly known as: NITROSTAT Place 1 tablet (0.4 mg total) under the tongue every 5 (five) minutes as needed for chest pain.   triamcinolone cream 0.1 % Commonly known as: KENALOG Apply 1 application topically 2 (two) times daily.   TRUEplus Safety Lancets 28G Misc Use as directed   Victoza 18 MG/3ML Sopn Generic drug: liraglutide SubQ:  Inject 0.6 mg once daily into the skin. Week 2: increase to 1.2 mg once daily; week 3: increase to 1.8 mg once daily What changed:   how much to take  how to take this  when to take this  additional instructions       Allergies  Allergen Reactions  . Codeine Itching  . Sulfa Antibiotics Hives   Follow-up Information    Marcine MatarJohnson, Deborah B, MD Follow up on 07/14/2019.   Specialty: Internal Medicine Why: @ 9:30 am for hospital follow up appointment. If you cannot make this scheduled appointment please call to reschedule.  Contact information: [address] [place] [address] [phone]            The results of significant diagnostics from this hospitalization (including imaging, microbiology, ancillary and laboratory) are listed below for reference.    Significant Diagnostic Studies: Dg Chest 2 View  Result Date: 06/13/2019 CLINICAL DATA:  Chest pain EXAM: CHEST - 2 VIEW COMPARISON:  02/15/2018 FINDINGS: The heart size and mediastinal contours are within normal limits. Both lungs are clear. The visualized skeletal structures are unremarkable. IMPRESSION: No acute abnormality of the lungs. Electronically Signed   By: Lauralyn PrimesAlex  Bibbey M.D.   On: 06/13/2019 13:38   Ct Coronary Morph W/cta Cor W/score W/ca W/cm &/or Wo/cm  Addendum Date: 06/15/2019   ADDENDUM REPORT: 06/15/2019 13:40 CLINICAL DATA:  32F presents with chest pain EXAM: Cardiac/Coronary  CTA TECHNIQUE: The patient was scanned on  a [employer]. FINDINGS: A 100 kV prospective scan was triggered in the descending thoracic aorta at 111 HU's. Axial non-contrast 3 mm slices were carried out through the heart. The data set was analyzed on a dedicated work station and scored using the Agatson method. Gantry rotation speed was 250 msecs and collimation was .6 mm. No beta blockade and 0.8 mg of sl NTG was given. The 3D data set was reconstructed in 5% intervals of the 67-82 % of the R-R cycle. Diastolic phases were analyzed on a dedicated work station using MPR, MIP and VRT modes. The patient received 80 cc of contrast. Aorta:  Normal size.  No calcifications.  No dissection. Aortic Valve:  Trileaflet.  No calcifications. 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 There is no significant plaque LAD is a large vessel. Calcified plaque in the proximal LAD causes 25-49% stenosis. 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 There is noncalcified plaque in the mid LCX that appears to cause severe stenosis, but vessel is small (<34mm) Other findings: Normal pulmonary vein drainage into the left atrium. Normal left atrial appendage without a thrombus. Normal size of the pulmonary artery. IMPRESSION: 1. Coronary calcium score of 81. This was 49 percentile for age and sex matched control. 2. 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 Noncalcified plaque in mid LCX appears to cause severe stenosis (70-99%) but vessel is small (<87mm) 3. Calcified plaque in the proximal LAD causes 25-49% stenosis Electronically Signed   By: Epifanio Lesches MD   On: 06/15/2019 13:40   Result Date: 06/15/2019 EXAM: OVER-READ INTERPRETATION  CT CHEST The following report is an over-read performed by radiologist Dr. Trudie Reed of [facility] on 06/15/2019. This over-read does not include interpretation of cardiac or coronary anatomy or pathology. The coronary calcium score/coronary CTA interpretation by the cardiologist is attached. COMPARISON:  None. FINDINGS: Aortic atherosclerosis. Within the visualized portions of the thorax there are no suspicious appearing pulmonary nodules or masses, there is no acute consolidative airspace disease, no pleural effusions, no pneumothorax and no lymphadenopathy. Visualized portions of the upper abdomen are unremarkable. There are no aggressive appearing lytic or blastic lesions noted in the visualized portions of the skeleton. IMPRESSION: 1. Aortic Atherosclerosis  (ICD10-I70.0). Electronically Signed: By: Trudie Reed M.D. On: 06/15/2019 12:18    Microbiology: Recent Results (from the past 240 hour(s))  SARS Coronavirus 2 by RT PCR (hospital order, performed in [facility] hospital lab) Nasopharyngeal Nasopharyngeal Swab     Status: None   Collection Time: 06/13/19  5:38 PM   Specimen: Nasopharyngeal Swab  Result Value Ref Range Status   SARS Coronavirus 2 NEGATIVE NEGATIVE Final    Comment: (NOTE) If result is NEGATIVE SARS-CoV-2 target nucleic acids are NOT DETECTED. The SARS-CoV-2 RNA is generally detectable in upper and lower  respiratory specimens during the acute phase of infection. The lowest  concentration of SARS-CoV-2 viral copies this assay can detect is 250  copies / mL. A negative result does not preclude SARS-CoV-2 infection  and should not be used as the sole basis for treatment or other  patient management decisions.  A negative result may occur with  improper specimen collection / handling, submission of specimen other  than nasopharyngeal swab, presence of viral mutation(s) within  the  areas targeted by this assay, and inadequate number of viral copies  (<250 copies / mL). A negative result must be combined with clinical  observations, patient history, and epidemiological information. If result is POSITIVE SARS-CoV-2 target nucleic acids are DETECTED. The SARS-CoV-2 RNA is generally detectable in upper and lower  respiratory specimens dur ing the acute phase of infection.  Positive  results are indicative of active infection with SARS-CoV-2.  Clinical  correlation with patient history and other diagnostic information is  necessary to determine patient infection status.  Positive results do  not rule out bacterial infection or co-infection with other viruses. If result is PRESUMPTIVE POSTIVE SARS-CoV-2 nucleic acids MAY BE PRESENT.   A presumptive positive result was obtained on the submitted specimen  and confirmed on  repeat testing.  While 2019 novel coronavirus  (SARS-CoV-2) nucleic acids may be present in the submitted sample  additional confirmatory testing may be necessary for epidemiological  and / or clinical management purposes  to differentiate between  SARS-CoV-2 and other Sarbecovirus currently known to infect humans.  If clinically indicated additional testing with an alternate test  methodology [phone]) is advised. The SARS-CoV-2 RNA is generally  detectable in upper and lower respiratory sp ecimens during the acute  phase of infection. The expected result is Negative. Fact Sheet for Patients:  BoilerBrush.com.cy Fact Sheet for Healthcare Providers: https://pope.com/ This test is not yet approved or cleared by the [place] FDA and has been authorized for detection and/or diagnosis of SARS-CoV-2 by FDA under an Emergency Use Authorization (EUA).  This EUA will remain in effect (meaning this test can be used) for the duration of the COVID-19 declaration under Section 564(b)(1) of the Act, 21 U.S.C. section 360bbb-3(b)(1), unless the authorization is terminated or revoked sooner. Performed at [facility], [address] [place], [place] [address]      Labs: Basic Metabolic Panel: Recent Labs  Lab 06/13/19 1319 06/14/19 0009  NA 135  --   K 4.2  --   CL 97*  --   CO2 26  --   GLUCOSE 233*  --   BUN 18  --   CREATININE 0.98 0.97  CALCIUM 9.4  --    Liver Function Tests: No results for input(s): AST, ALT, ALKPHOS, BILITOT, PROT, ALBUMIN in the last 168 hours. No results for input(s): LIPASE, AMYLASE in the last 168 hours. No results for input(s): AMMONIA in the last 168 hours. CBC: Recent Labs  Lab 06/13/19 1319 06/14/19 0009  WBC 8.1 7.2  HGB 14.8 14.0  HCT 46.2* 41.2  MCV 85.4 83.9  PLT 259 230   Cardiac Enzymes: No results for input(s): CKTOTAL, CKMB, CKMBINDEX, TROPONINI in the last 168  hours. BNP: BNP (last 3 results) No results for input(s): BNP in the last 8760 hours.  ProBNP (last 3 results) No results for input(s): PROBNP in the last 8760 hours.  CBG: Recent Labs  Lab 06/14/19 0737 06/14/19 1132 06/14/19 1143 06/14/19 1557 06/14/19 2102  GLUCAP 213* 55* 115* 257* 329*       Signed:  Zannie Cove MD.  [facility] 06/15/2019, 2:39 PM

## 2019-06-15 NOTE — Care Management (Signed)
1148 06-15-19 Patient presented for Chest Pain-patient is without insurance at this time. Hospital follow up appointment scheduled at the [facility] with MD Wynetta Emery. Patient will be able to utilize the pharmacy onsite at the [facility]. No further needs identified at this time. Bethena Roys, RN,BSN Case Manager [phone]

## 2019-06-15 NOTE — Progress Notes (Addendum)
Progress Note  Patient Name: [name] Date of Encounter: 06/15/2019  Primary Cardiologist: No primary care provider on file.   Subjective  O/N events: N/A  [name] was examined and evaluated at bedside this AM. She states she had an uneventful night. No reoccurrence of chest pain. Denies any palpitations or dyspnea. Discussed plan to get Coronary CT today and if no concerning findings, possible discharge later today. [name] expressed understanding.  Inpatient Medications    Scheduled Meds: . aspirin EC  81 mg Oral Daily  . atorvastatin  80 mg Oral q1800  . diclofenac sodium  2 g Topical TID AC & HS  . enoxaparin (LOVENOX) injection  40 mg Subcutaneous Daily  . FLUoxetine  60 mg Oral Daily  . fluticasone  2 spray Each Nare Daily  . gabapentin  900 mg Oral BID  . insulin detemir  40 Units Subcutaneous BID  . ivabradine  5 mg Oral Once  . metoprolol tartrate  25 mg Oral Once  . pantoprazole  40 mg Oral Daily  . triamcinolone cream  1 application Topical BID   Continuous Infusions:  PRN Meds: acetaminophen, HYDROcodone-acetaminophen, hydrOXYzine, nitroGLYCERIN, ondansetron (ZOFRAN) IV   Vital Signs    Vitals:   06/14/19 1926 06/15/19 0412 06/15/19 0817 06/15/19 0832  BP: 111/83 92/64 101/73 102/79  Pulse: 83 67    Resp:  16    Temp: 98.7 F (37.1 C) 98 F (36.7 C)    TempSrc: Oral Oral    SpO2: 98% 97%    Weight:  102.4 kg    Height:        Intake/Output Summary (Last 24 hours) at 06/15/2019 0850 Last data filed at 06/15/2019 0845 Gross per 24 hour  Intake 1222 ml  Output 1300 ml  Net -78 ml   Filed Weights   06/13/19 1257 06/15/19 0412  Weight: 103.4 kg 102.4 kg    Telemetry    Normal sinus - Personally Reviewed  ECG    Normal sinus normal axis, no ST changes - Personally Reviewed  Physical Exam   Gen: Well-developed, well nourished, NAD HEENT: NCAT head, hearing intact, EOMI Neck: supple, ROM intact, no JVD, no cervical  adenopathy CV: RRR, S1, S2 normal, No rubs, no murmurs, no gallops Pulm: CTAB, No rales, no wheezes, no dullness to percussion  Abd: Soft, BS+, NTND, No rebound, no guarding Extm: ROM intact, Peripheral pulses intact, No peripheral edema Skin: Dry, Warm, normal turgor, no rashes, lesions, wounds.  Neuro: AAOx3  Labs    Chemistry Recent Labs  Lab 06/13/19 1319 06/14/19 0009  NA 135  --   K 4.2  --   CL 97*  --   CO2 26  --   GLUCOSE 233*  --   BUN 18  --   CREATININE 0.98 0.97  CALCIUM 9.4  --   GFRNONAA >60 >60  GFRAA >60 >60  ANIONGAP 12  --      Hematology Recent Labs  Lab 06/13/19 1319 06/14/19 0009  WBC 8.1 7.2  RBC 5.41* 4.91  HGB 14.8 14.0  HCT 46.2* 41.2  MCV 85.4 83.9  MCH 27.4 28.5  MCHC 32.0 34.0  RDW 12.2 12.3  PLT 259 230    Cardiac EnzymesNo results for input(s): TROPONINI in the last 168 hours. No results for input(s): TROPIPOC in the last 168 hours.   BNPNo results for input(s): BNP, PROBNP in the last 168 hours.   DDimer No results for input(s): DDIMER in the last  168 hours.   Radiology    Dg Chest 2 View  Result Date: 06/13/2019 CLINICAL DATA:  Chest pain EXAM: CHEST - 2 VIEW COMPARISON:  02/15/2018 FINDINGS: The heart size and mediastinal contours are within normal limits. Both lungs are clear. The visualized skeletal structures are unremarkable. IMPRESSION: No acute abnormality of the lungs. Electronically Signed   By: Lauralyn Primes M.D.   On: 06/13/2019 13:38    Cardiac Studies   10/31/2010 MYOCARDIAL IMAGING WITH SPECT (REST AND EXERCISE) GATED LEFT VENTRICULAR WALL MOTION STUDY  Technique: Standard myocardial SPECT imaging was performed after resting intravenous injection of 10 mCi Tc-13m tetrofosmin. Subsequently, exercise tolerance test was performed by the patient under the supervision of the Cardiology staff. At peak-stress, 30 mCi Tc-33m tetrofosminwas injected intravenously and standard myocardial SPECT imaging was  performed. Quantitative gated imaging was also performed to evaluate left ventricular wall motion, and estimate left ventricular ejection fraction.  Comparison: None.  Findings: Patient walked for 9:08 on standard Bruce protocol. Stopped due to fatigue and chest pressure. Pre-test 5/10 CP increased to 8/10 during test. Resting HR 77. Peak HR 162. Peak BP 129/89. No significant ST-T changes with exercise.  Perfusion imaging normal at stress and exercise.  Post-exercise gated SPECT. EF 71% with no wall motion abnormalities. EDV 48 cc. ESV 14 cc.  IMPRESSION: Normal exercise Myoview with no evidence of infarct or ischemia.  07/29/2006  CORONARY ANGIOGRAPHY: There is no true left main stem. The LAD has aseparate ostium. The LAD is a large-diameter vessel coursing down tothe left ventricular apex; it gives off a large first and seconddiagonal branch and the second diagonal branch bifurcates into twinvessels. There is 1 large septal perforator. There is no significantobstructive disease throughout the LAD system.  The right coronary artery is dominant. It gives off an acute marginalbranch in its midportion as well as a PDA and 2 posterolateral segments. There is no significant disease throughout the right coronary artery.There was spasm just off the end of the catheter on the second angiographic image. This was clearly normal on other images.  There is an anomalous left circumflex that takes off from the right coronary cusp. It has a very anteriorly directed takeoff. As above, I attempted to image this with multiple catheters and was not able toselectively engage this vessel. Its proximal portions are clearly normal, but I am unable to visualize the distal portion of the vessel.  LEFT VENTRICULOGRAM: Performed in the 30-degree RAO projection demonstrates normal left ventricular function with an estimated left ventricular ejection fraction of 60%.   ASSESSMENT: 1. No obstructive coronary artery disease. 2. 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 3. Normal left ventricular function.  Patient Profile     [name] is a 48 y.o. female with a hx of RA, T2DM, E-cigarette use, HLD, HTN admit for unstable angina.  Assessment & Plan    Unstable Angina Currently chest pain free. No reoccurrence over night. Heart score of 5 due to multiple risk factors. Echo w/o wall motion abnormalities. - Coronary CT ordered: need HR <65 to perform - C/w metoprolol - Corlanor ordered to assist in controlling rate - C/w telemetery - C/w Aspirin - Nitro PRN for pain  T2DM hgb a1c on 06/2019 >12 - C/w Levemir 40 BID - SSI - Glucose checks - Need better control after discharge  HLD Not on statin at home. LDL >200 - C/w atorvastatin 80mg     For questions or updates, please contact [facility] Please consult  [email or web address] for contact info under Cardiology/STEMI.   Signed, Gilberto Better, MD PGY-2, [place] [facility] Pager: [phone] 06/15/2019, 8:50 AM    Attending attestation to follow  I have seen and examined the patient along with Gilberto Better, MD.  I have reviewed the chart, notes and new data.  I agree with PA/NP's note.  Key new complaints: no further angina Key examination changes: no CHF Key new findings / data: Cardiac CT angio reviewed. Very high calcium score, diffusely disease LCx (anomalous from RCA, but very small), 40% LAD, FFR will be sent.  PLAN: No plan for invasive angiography. Needs intensive risk factor modification. Target LDL <70, A1c <7%, BP 130/80 or less. Aspirin, low dose long acting nitrates, prn SL NTG.  Sanda Klein, MD, [place] [facility] [phone] 06/15/2019, 2:00 PM

## 2019-06-15 NOTE — Plan of Care (Signed)
Discharged to home with self care, printed and verbal education complete, prescriptions given to pt, left facility via [facility].

## 2019-06-15 NOTE — Plan of Care (Signed)
  Problem: Clinical Measurements: Goal: Ability to maintain clinical measurements within normal limits will improve Outcome: Progressing Goal: Diagnostic test results will improve Outcome: Progressing Goal: Cardiovascular complication will be avoided Outcome: Progressing   Problem: Education: Goal: Understanding of cardiac disease, CV risk reduction, and recovery process will improve Outcome: Progressing Goal: Individualized Educational Video(s) Outcome: Progressing   Problem: Cardiac: Goal: Ability to achieve and maintain adequate cardiovascular perfusion will improve Outcome: Progressing   Problem: Health Behavior/Discharge Planning: Goal: Ability to safely manage health-related needs after discharge will improve Outcome: Progressing

## 2019-06-16 ENCOUNTER — Institutional Professional Consult (permissible substitution): Payer: Medicaid Other | Admitting: Licensed Clinical Social Worker

## 2019-06-16 ENCOUNTER — Ambulatory Visit: Payer: Medicaid Other | Admitting: Internal Medicine

## 2019-06-16 MED FILL — NITROGLYCERIN 0.4 MG TAB SL: 0.4 | 25 days supply | Qty: 25 | Fill #0

## 2019-06-16 MED FILL — ATORVASTATIN 80 MG TABLET: 80 | 30 days supply | Qty: 30 | Fill #0

## 2019-06-16 MED FILL — ISOSORBIDE MN ER 30 MG TAB: 30 | 30 days supply | Qty: 30 | Fill #0

## 2019-06-17 DIAGNOSIS — R079 Chest pain, unspecified: Secondary | ICD-10-CM

## 2019-06-19 LAB — GLUCOSE, CAPILLARY
Glucose-Capillary: 161 mg/dL — ABNORMAL HIGH (ref 70–99)
Glucose-Capillary: 178 mg/dL — ABNORMAL HIGH (ref 70–99)
Glucose-Capillary: 197 mg/dL — ABNORMAL HIGH (ref 70–99)
Glucose-Capillary: 235 mg/dL — ABNORMAL HIGH (ref 70–99)

## 2019-06-20 ENCOUNTER — Encounter: Payer: Self-pay | Admitting: Internal Medicine

## 2019-06-20 DIAGNOSIS — I25119 Atherosclerotic heart disease of native coronary artery with unspecified angina pectoris: Secondary | ICD-10-CM

## 2019-06-29 ENCOUNTER — Encounter: Payer: Self-pay | Admitting: Cardiology

## 2019-06-29 ENCOUNTER — Ambulatory Visit (INDEPENDENT_AMBULATORY_CARE_PROVIDER_SITE_OTHER): Payer: Self-pay | Admitting: Cardiology

## 2019-06-29 ENCOUNTER — Other Ambulatory Visit: Payer: Self-pay

## 2019-06-29 VITALS — BP 110/78 | HR 86 | Temp 97.0°F | Ht 69.0 in | Wt 235.4 lb

## 2019-06-29 DIAGNOSIS — E785 Hyperlipidemia, unspecified: Secondary | ICD-10-CM

## 2019-06-29 DIAGNOSIS — R079 Chest pain, unspecified: Secondary | ICD-10-CM

## 2019-06-29 DIAGNOSIS — Z720 Tobacco use: Secondary | ICD-10-CM

## 2019-06-29 DIAGNOSIS — I25118 Atherosclerotic heart disease of native coronary artery with other forms of angina pectoris: Secondary | ICD-10-CM

## 2019-06-29 MED ORDER — METOPROLOL TARTRATE 25 MG PO TABS: 25.0000 mg | ORAL_TABLET | Freq: Two times a day (BID) | ORAL | 3 refills | Status: DC

## 2019-06-29 MED ORDER — ASPIRIN EC 81 MG PO TBEC: 81.0000 mg | DELAYED_RELEASE_TABLET | Freq: Every day | ORAL | 3 refills | Status: DC

## 2019-06-29 MED FILL — ?METOPROLOL 25 MG TABLET: 25 | 30 days supply | Qty: 60 | Fill #0

## 2019-06-29 NOTE — Patient Instructions (Signed)
Medication Instructions:  Start Aspirin 81 mg daily Start Metoprolol Tartrate 25 mg twice a day   Lab Work: None ordered   Testing/Procedures: None ordered  Follow-Up: At [facility], you and your health needs are our priority.  As part of our continuing mission to provide you with exceptional heart care, we have created designated Provider Care Teams.  These Care Teams include your primary Cardiologist (physician) and Advanced Practice Providers (APPs -  Physician Assistants and Nurse Practitioners) who all work together to provide you with the care you need, when you need it.  Your next appointment:  1 month     Monday 07/31/19 at 9:20 am   The format for your next appointment:  In office   Provider:  Dr.Schumann

## 2019-06-29 NOTE — Progress Notes (Signed)
Cardiology Office Note:    Date:  06/29/2019   ID:  [name], DOB November 03, 1970, MRN [id number]  PCP:  Marcine Matar, MD  Cardiologist:  No primary care provider on file.  Electrophysiologist:  None   Referring MD: Marcine Matar, MD   Chief Complaint  Patient presents with  . Chest Pain   History of Present Illness:    [name] is a 48 y.o. female with a hx of type 2 diabetes, hypertension, hyperlipidemia who presents for an initial evaluation after hospital discharge for chest pain.  Patient was admitted to [facility] on 06/13/19 with chest pain.  EKG was unremarkable and high-sensitivity troponins were negative.  However due to her extensive risk factors including uncontrolled diabetes, coronary CTA was ordered.  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  She was discharged on medical management: atorvastatin 80 mg, Imdur 30 mg.  TTE showed EF 60-65%.    Since discharge from hospital, she reports that she continues to have chest pain.  Has been occurring nearly daily, describes dull left-sided chest pain that occurs when she is stressed.  No clear relationship with exertion though she states she has not really been exerting herself.  Pain resolves with nitroglycerin.  Has been taking about 1 nitroglycerin per day, but reports she had 1 day where she took 3 NTGs.   Past Medical History:  Diagnosis Date  . Arthritis    fingers  . Cervical dysplasia   . Depression   . Diabetes mellitus   . Dyspnea 11/22/2013  . GERD (gastroesophageal reflux disease)   . High cholesterol   . Hypertension    no meds now  . Kidney stones   . PONV (postoperative nausea and  vomiting)   . Sleep apnea    lost 70lbs no cpap x41yrs now    Past Surgical History:  Procedure Laterality Date  . CARDIAC CATHETERIZATION     4-52yrs ago  . CERVICAL CONIZATION W/BX N/A 05/04/2018   Procedure: CONIZATION CERVIX WITH BIOPSY - COLD KNIFE;  Surgeon: Hermina Staggers, MD;  Location: [facility];  Service: Gynecology;  Laterality: N/A;  . CHOLECYSTECTOMY    . ENDOMETRIAL ABLATION     6 years ago  . IRRIGATION AND DEBRIDEMENT ABSCESS Right 03/17/2017   Procedure: IRRIGATION AND DEBRIDEMENT RIGHT THIGH ABSCESS;  Surgeon: Karie Soda, MD;  Location: [facility];  Service: General;  Laterality: Right;  . LAPAROSCOPIC APPENDECTOMY  10/04/2011   Procedure: APPENDECTOMY LAPAROSCOPIC;  Surgeon: Rulon Abide, DO;  Location: [facility];  Service: General;  Laterality: N/A;    Current Medications: Current Meds  Medication Sig  . atorvastatin (LIPITOR) 80 MG tablet Take 1 tablet (80 mg total) by mouth daily at 6 PM.  . diclofenac sodium (VOLTAREN) 1 % GEL Apply 2 g topically 4 (four) times daily.  Marland Kitchen esomeprazole (NEXIUM) 20 MG capsule Take 20 mg by mouth daily.   Marland Kitchen FLUoxetine (PROZAC) 20 MG tablet 1 tab daily witht a 60 mg tab (Patient taking differently: Take 20 mg by mouth daily. (take with 40mg  to equal 60mg  total))  . FLUoxetine (PROZAC) 40 MG capsule Take one tab daily with a 20 mg daily (Patient  taking differently: Take 40 mg by mouth daily. (take with 20mg  to equal 60mg  total))  . fluticasone (FLONASE) 50 MCG/ACT nasal spray Place 2 sprays into both nostrils daily.  Marland Kitchen gabapentin (NEURONTIN) 300 MG capsule Take 3 capsules (900 mg total) by mouth 2 (two) times daily.  Marland Kitchen glucose blood (TRUE METRIX BLOOD GLUCOSE TEST) test strip Use as instructed  . Insulin Detemir (LEVEMIR FLEXTOUCH) 100 UNIT/ML Pen Inject 50 Units into the skin 2 (two) times daily.  . Insulin Pen Needle (B-D UF III MINI PEN NEEDLES) 31G X 5 MM MISC Use as instructed. Inject into the skin three times daily   . isosorbide mononitrate (IMDUR) 30 MG 24 hr tablet Take 1 tablet (30 mg total) by mouth daily.  Marland Kitchen liraglutide (VICTOZA) 18 MG/3ML SOPN SubQ:  Inject 0.6 mg once daily into the skin. Week 2: increase to 1.2 mg once daily; week 3: increase to 1.8 mg once daily (Patient taking differently: Inject 1.8 mg into the skin daily. )  . metFORMIN (GLUCOPHAGE-XR) 500 MG 24 hr tablet Take 2 tablets (1,000 mg total) by mouth daily with breakfast. Take 2 tablets twice a day. (Patient taking differently: Take 1,000 mg by mouth daily with breakfast. )  . nitroGLYCERIN (NITROSTAT) 0.4 MG SL tablet Place 1 tablet (0.4 mg total) under the tongue every 5 (five) minutes as needed for chest pain.  Marland Kitchen triamcinolone cream (KENALOG) 0.1 % Apply 1 application topically 2 (two) times daily.  . TRUEPLUS SAFETY LANCETS 28G MISC Use as directed     Allergies:   Codeine and Sulfa antibiotics   Social History   Socioeconomic History  . Marital status: Widowed    Spouse name: Not on file  . Number of children: 1  . Years of education: Not on file  . Highest education level: Not on file  Occupational History  . Occupation: none  Social Needs  . Financial resource strain: Not on file  . Food insecurity    Worry: Not on file    Inability: Not on file  . Transportation needs    Medical: Not on file    Non-medical: Not on file  Tobacco Use  . Smoking status: Former Smoker    Packs/day: 0.00    Years: 24.00    Pack years: 0.00    Types: E-cigarettes  . Smokeless tobacco: Current User  . Tobacco comment: vape  Substance and Sexual Activity  . Alcohol use: Yes    Comment: rare  . Drug use: No  . Sexual activity: Yes    Birth control/protection: None  Lifestyle  . Physical activity    Days per week: Not on file    Minutes per session: Not on file  . Stress: Not on file  Relationships  . Social Herbalist on phone: Not on file    Gets together: Not on file    Attends religious service: Not on file     Active member of club or organization: Not on file    Attends meetings of clubs or organizations: Not on file    Relationship status: Not on file  Other Topics Concern  . Not on file  Social History Narrative  . Not on file     Family History: The patient's family history includes Aneurysm in her mother; Breast cancer in her paternal grandmother; Heart attack in her father; Stroke in her father.  ROS:   Please see the history of present illness.     All  other systems reviewed and are negative.  EKGs/Labs/Other Studies Reviewed:    The following studies were reviewed today:   EKG:  EKG is ordered today.  The ekg ordered today demonstrates normal sinus rhythm, rate 83, no ST/T abnormality  TTE 06/14/19:  1. Left ventricular ejection fraction, by visual estimation, is 60 to 65%. The left ventricle has normal function. Normal left ventricular size. There is no left ventricular hypertrophy.  2. Left ventricular diastolic Doppler parameters are consistent with impaired relaxation pattern of LV diastolic filling.  3. Global right ventricle has normal systolic function.The right ventricular size is normal. No increase in right ventricular wall thickness.  4. Left atrial size was normal.  5. Right atrial size was normal.  6. The mitral valve is normal in structure. No evidence of mitral valve regurgitation. No evidence of mitral stenosis.  7. The tricuspid valve is normal in structure. Tricuspid valve regurgitation was not visualized by color flow Doppler.  8. The aortic valve is normal in structure. Aortic valve regurgitation was not visualized by color flow Doppler. Structurally normal aortic valve, with no evidence of sclerosis or stenosis.  9. The pulmonic valve was normal in structure. Pulmonic valve regurgitation is not visualized by color flow Doppler. 10. The inferior vena cava is normal in size with greater than 50% respiratory variability, suggesting right atrial pressure of 3  mmHg.  Coronary CT 06/15/19: 1. Coronary calcium score of 81. This was 23 percentile for age and sex matched control. 2. 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 Noncalcified plaque in mid LCX appears to cause severe stenosis (70-99%) but vessel is small (<75mm) 3. Calcified plaque in the proximal LAD causes 25-49% stenosis  CTFFR 06/15/19: 1. CT-FFR across lesion in proximal LAD is 0.92, suggesting lesion is not functionally significant 2. CT-FFR across lesion in ostial D2 is 0.62, suggesting functional significance 3. CT-FFR across lesion in anomalous circumflex is 0.65, suggesting functional significance  Recent Labs: 06/05/2019: ALT 17 06/13/2019: BUN 18; Potassium 4.2; Sodium 135 06/14/2019: Creatinine, Ser 0.97; Hemoglobin 14.0; Platelets 230  Recent Lipid Panel    Component Value Date/Time   CHOL 307 (H) 06/14/2019 0009   CHOL 283 (H) 06/05/2019 1039   TRIG 237 (H) 06/14/2019 0009   HDL 56 06/14/2019 0009   HDL 60 06/05/2019 1039   CHOLHDL 5.5 06/14/2019 0009   VLDL 47 (H) 06/14/2019 0009   LDLCALC 204 (H) 06/14/2019 0009   LDLCALC 165 (H) 06/05/2019 1039    Physical Exam:    VS:  BP 110/78   Pulse 86   Temp (!) 97 F (36.1 C)   Ht 5\' 9"  (1.753 m)   Wt 235 lb 6.4 oz (106.8 kg)   SpO2 98%   BMI 34.76 kg/m     Wt Readings from Last 3 Encounters:  06/29/19 235 lb 6.4 oz (106.8 kg)  06/15/19 225 lb 12.8 oz (102.4 kg)  06/05/19 228 lb (103.4 kg)     GEN:  Well nourished, well developed in no acute distress HEENT: Normal NECK: No JVD; No carotid bruits LYMPHATICS: No lymphadenopathy CARDIAC: RRR, no murmurs, rubs, gallops RESPIRATORY:  Clear to auscultation without rales, wheezing or rhonchi  ABDOMEN: Soft, non-tender, non-distended MUSCULOSKELETAL:  No edema; No deformity  SKIN: Warm and dry NEUROLOGIC:  Alert and oriented x 3 PSYCHIATRIC:  Normal affect   ASSESSMENT:    1. Left-sided chest  pain   2. Coronary artery disease of native artery of  native heart with stable angina pectoris (HCC)   3. Tobacco use   4. Hyperlipidemia, unspecified hyperlipidemia type    PLAN:    Coronary artery disease with stable angina: Coronary CTA indicates severe obstructive disease in anomalous circumflex and second diagonal branch.  These are both small vessels and medical management is recommended.  She continues to have chest pain that improves with nitroglycerin, states has been taking 1 nitroglycerin per day.  Normal LV systolic function -Start aspirin 81 mg daily  -Continue atorvastatin 80 mg daily -Continue Imdur 30 mg daily -Start metoprolol 25 mg twice daily.  Will titrate metoprolol and Imdur as needed for angina -Continue as needed nitroglycerin  Hyperlipidemia: LDL 204 on 06/14/19.  On atorvastatin 80 mg, started 2 weeks ago.  Will plan to recheck lipid panel in 2-3 months  Tobacco use: smoked 2ppd x 30 years, has quit smoking but now vaping.  Discussed risks of vaping and cessation strongly recommended  Type 2 diabetes: A1c 12.2 on 06/05/19. On insulin, victoza, metformin  RTC in 1 month   Medication Adjustments/Labs and Tests Ordered: Current medicines are reviewed at length with the patient today.  Concerns regarding medicines are outlined above.  Orders Placed This Encounter  Procedures  . EKG 12-Lead   Meds ordered this encounter  Medications  . aspirin EC 81 MG tablet    Sig: Take 1 tablet (81 mg total) by mouth daily.    Dispense:  90 tablet    Refill:  3  . metoprolol tartrate (LOPRESSOR) 25 MG tablet    Sig: Take 1 tablet (25 mg total) by mouth 2 (two) times daily.    Dispense:  180 tablet    Refill:  3    Patient Instructions  Medication Instructions:  Start Aspirin 81 mg daily Start Metoprolol Tartrate 25 mg twice a day   Lab Work: None ordered   Testing/Procedures: None ordered  Follow-Up: [facility], you and your health needs are our  priority.  As part of our continuing mission to provide you with exceptional heart care, we have created designated Provider Care Teams.  These Care Teams include your primary Cardiologist (physician) and Advanced Practice Providers (APPs -  Physician Assistants and Nurse Practitioners) who all work together to provide you with the care you need, when you need it.  Your next appointment:  1 month     Monday 07/31/19 at 9:20 am   The format for your next appointment:  In office   Provider:  Dr.Alitzel Cookson      Signed, Little Ishikawahristopher L Shruti Arrey, MD  06/29/2019 9:25 AM    [facility]

## 2019-07-12 ENCOUNTER — Telehealth: Payer: Self-pay | Admitting: Cardiology

## 2019-07-12 NOTE — Telephone Encounter (Signed)
Pt states that she has had chest pain once yesterday that was alleviated by nitro x1, but she states that she was very fatigued after and then again when calling she took the nitro x1 and the pain was alleviated within a minute. She has not taken her BP. She states that Dr Gardiner Rhyme told her to call if the metoprolol.isosorbide did not "fix this". She states that she just wanted to call and let Dr Gardiner Rhyme know. I have scheduled an appt for her 07-18-2019 to discuss this with Dr Gardiner Rhyme. She will go to the ER if this recurs. Will forward to Dr Gardiner Rhyme for review.

## 2019-07-12 NOTE — Telephone Encounter (Signed)
Pt c/o medication issue:  1. Name of Medication: metoprolol tartrate (LOPRESSOR) 25 MG tablet  2. How are you currently taking this medication (dosage and times per day)? 2x daily   3. Are you having a reaction (difficulty breathing--STAT)?   4. What is your medication issue? Chest pain   Pt c/o of Chest Pain: STAT if CP now or developed within 24 hours  1. Are you having CP right now? yes  2. Are you experiencing any other symptoms (ex. SOB, nausea, vomiting, sweating)? occasional dizziness  3. How long have you been experiencing CP? Mild for a couple days, pain started getting stronger last night and has been intense  4. Is your CP continuous or coming and going? Comes and goes. Pt states that pain can last for an hour or more , but has relief at times,   5. Have you taken Nitroglycerin? Yes. She took 1 yesterday evening. She should take one but needs to drive ?

## 2019-07-13 ENCOUNTER — Other Ambulatory Visit: Payer: Self-pay | Admitting: Nurse Practitioner

## 2019-07-13 DIAGNOSIS — E1142 Type 2 diabetes mellitus with diabetic polyneuropathy: Secondary | ICD-10-CM

## 2019-07-13 MED ORDER — ISOSORBIDE MONONITRATE ER 30 MG PO TB24: 30.0000 mg | ORAL_TABLET | Freq: Every day | ORAL | 2 refills | Status: DC

## 2019-07-13 MED ORDER — NITROGLYCERIN 0.4 MG SL SUBL: 0.4000 mg | SUBLINGUAL_TABLET | SUBLINGUAL | 2 refills | Status: DC | PRN

## 2019-07-13 MED ORDER — ATORVASTATIN CALCIUM 80 MG PO TABS: 80.0000 mg | ORAL_TABLET | Freq: Every day | ORAL | 2 refills | Status: DC

## 2019-07-13 MED FILL — !VICTOZA 18MG/3ML INJECT: 18 | 30 days supply | Qty: 9 | Fill #0

## 2019-07-13 MED FILL — FLUoxetine HCL 40 MG CAPS: 40 | 30 days supply | Qty: 30 | Fill #1

## 2019-07-13 MED FILL — ATORVASTATIN 80 MG TABLET: 80 | 30 days supply | Qty: 30 | Fill #0

## 2019-07-13 MED FILL — ?FLUOXETINE HCL 20MG TABLE: 20 | 30 days supply | Qty: 30 | Fill #1

## 2019-07-13 MED FILL — METFORMIN HCL ER 500 MG TB2: 500 | 30 days supply | Qty: 60 | Fill #1

## 2019-07-13 MED FILL — ISOSORBIDE MN ER 30 MG TAB: 30 | 30 days supply | Qty: 30 | Fill #0

## 2019-07-13 MED FILL — GABAPENTIN 300 MG CAPSULE: 300 | 30 days supply | Qty: 180 | Fill #2

## 2019-07-13 MED FILL — NITROGLYCERIN 0.4 MG TAB SL: 0.4 | 25 days supply | Qty: 25 | Fill #0

## 2019-07-14 ENCOUNTER — Encounter: Payer: Self-pay | Admitting: Internal Medicine

## 2019-07-14 ENCOUNTER — Ambulatory Visit: Payer: Self-pay | Attending: Internal Medicine | Admitting: Internal Medicine

## 2019-07-14 ENCOUNTER — Other Ambulatory Visit: Payer: Self-pay

## 2019-07-14 DIAGNOSIS — E785 Hyperlipidemia, unspecified: Secondary | ICD-10-CM

## 2019-07-14 DIAGNOSIS — Z794 Long term (current) use of insulin: Secondary | ICD-10-CM

## 2019-07-14 DIAGNOSIS — IMO0002 Reserved for concepts with insufficient information to code with codable children: Secondary | ICD-10-CM

## 2019-07-14 DIAGNOSIS — E1169 Type 2 diabetes mellitus with other specified complication: Secondary | ICD-10-CM

## 2019-07-14 DIAGNOSIS — E1140 Type 2 diabetes mellitus with diabetic neuropathy, unspecified: Secondary | ICD-10-CM

## 2019-07-14 DIAGNOSIS — E1165 Type 2 diabetes mellitus with hyperglycemia: Secondary | ICD-10-CM

## 2019-07-14 DIAGNOSIS — I25119 Atherosclerotic heart disease of native coronary artery with unspecified angina pectoris: Secondary | ICD-10-CM

## 2019-07-14 MED ORDER — LEVEMIR FLEXTOUCH 100 UNIT/ML ~~LOC~~ SOPN: 53.0000 [IU] | PEN_INJECTOR | Freq: Two times a day (BID) | SUBCUTANEOUS | 11 refills | Status: DC

## 2019-07-14 MED FILL — $LEVEMIR FLEXTOUCH 100 UNIT: 100 | 28 days supply | Qty: 30 | Fill #0

## 2019-07-14 NOTE — Progress Notes (Signed)
Virtual Visit via Telephone Note Due to current restrictions/limitations of in-office visits due to the COVID-19 pandemic, this scheduled clinical appointment was converted to a telehealth visit  I connected with [name] on 07/14/19 at 9:39 a.m by telephone and verified that I am speaking with the correct person using two identifiers. I am in my office.  The patient is at home.  Only the patient and myself participated in this encounter.  I discussed the limitations, risks, security and privacy concerns of performing an evaluation and management service by telephone and the availability of in person appointments. I also discussed with the patient that there may be a patient responsible charge related to this service. The patient expressed understanding and agreed to proceed.   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  Purpose of today's visit is hospital follow-up.  Since last visit with me, patient was hospitalized with chest pains.  EKG was unremarkable and troponins were negative.  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  Cardiology recommended medical management.  She was started on metoprolol, Imdur and told to take aspirin.  She was not taking Lipitor.  She was started on Lipitor 80 mg.   She has seen cardiology in f/u 06/29/2019. Reports compliance with meds.  Did not have CPs for 5 days after starting Metoprolol but then started having CP again.  Having to use SL Nitro every day.  She called for f/u with cardiologist and scheduled for 07/18/2019. Wondering if she needs procedure to open up blood vessels that are blocked  DM:  Reports BS have been much better.  Checking BS 1-2 x a day before meals.  BS before BF range  140-200. -more compliant with taking Levemir since last visit with me.  Currently on 50 mg BID, Victoza and Metformin 100 mg -doing better with eating habits Tells me she actually had eye exam this past summer at [facility].  She will call them and have them send me the information.   Outpatient Encounter Medications as of 07/14/2019  Medication Sig  . aspirin EC 81 MG tablet Take 1 tablet (81 mg total) by mouth daily.  Marland Kitchen atorvastatin (LIPITOR) 80 MG tablet Take 1 tablet (80 mg total) by mouth daily at 6 PM.  . diclofenac sodium (VOLTAREN) 1 % GEL Apply 2 g topically 4 (four) times daily.  Marland Kitchen esomeprazole (NEXIUM) 20 MG capsule Take 20 mg by mouth daily.   Marland Kitchen FLUoxetine (PROZAC) 20 MG tablet 1 tab daily witht a 60 mg tab (Patient taking differently: Take 20 mg by mouth daily. (take with 40mg  to equal 60mg  total))  . FLUoxetine (PROZAC) 40 MG capsule Take one tab daily with a 20 mg daily (Patient taking differently: Take 40 mg by mouth daily. (take with 20mg  to equal 60mg  total))  . fluticasone (FLONASE) 50 MCG/ACT nasal spray Place 2 sprays into both nostrils daily.  Marland Kitchen gabapentin (NEURONTIN) 300 MG capsule Take 3 capsules (900 mg total) by mouth 2 (two) times daily.  Marland Kitchen glucose blood (TRUE METRIX BLOOD GLUCOSE TEST) test strip Use as instructed  . Insulin Detemir (LEVEMIR FLEXTOUCH) 100 UNIT/ML Pen Inject 50 Units into the skin 2 (two) times daily.  . Insulin Pen Needle (B-D UF III MINI PEN NEEDLES) 31G X 5 MM MISC Use as instructed. Inject into  the skin three times daily  . isosorbide mononitrate (IMDUR) 30 MG 24 hr tablet Take 1 tablet (30 mg total) by mouth daily.  Marland Kitchen liraglutide (VICTOZA) 18 MG/3ML SOPN Inject 0.3 mLs (1.8 mg total) into the skin daily.  . metFORMIN (GLUCOPHAGE-XR) 500 MG 24 hr tablet Take 2 tablets (1,000 mg total) by mouth daily with breakfast. Take 2 tablets twice a day. (Patient taking differently: Take 1,000 mg by mouth daily with breakfast. )  . metoprolol tartrate  (LOPRESSOR) 25 MG tablet Take 1 tablet (25 mg total) by mouth 2 (two) times daily.  . nitroGLYCERIN (NITROSTAT) 0.4 MG SL tablet Place 1 tablet (0.4 mg total) under the tongue every 5 (five) minutes as needed for chest pain.  Marland Kitchen triamcinolone cream (KENALOG) 0.1 % Apply 1 application topically 2 (two) times daily.  . TRUEPLUS SAFETY LANCETS 28G MISC Use as directed   No facility-administered encounter medications on file as of 07/14/2019.     Observations/Objective: Lab Results  Component Value Date   CHOL 307 (H) 06/14/2019   HDL 56 06/14/2019   LDLCALC 204 (H) 06/14/2019   TRIG 237 (H) 06/14/2019   CHOLHDL 5.5 06/14/2019   Lab Results  Component Value Date   WBC 7.2 06/14/2019   HGB 14.0 06/14/2019   HCT 41.2 06/14/2019   MCV 83.9 06/14/2019   PLT 230 06/14/2019     Chemistry      Component Value Date/Time   NA 135 06/13/2019 1319   NA 141 06/10/2018 1618   K 4.2 06/13/2019 1319   CL 97 (L) 06/13/2019 1319   CO2 26 06/13/2019 1319   BUN 18 06/13/2019 1319   BUN 26 (H) 06/10/2018 1618   CREATININE 0.97 06/14/2019 0009      Component Value Date/Time   CALCIUM 9.4 06/13/2019 1319   ALKPHOS 109 06/05/2019 1039   AST 17 06/05/2019 1039   ALT 17 06/05/2019 1039   BILITOT <0.2 06/05/2019 1039       Assessment and Plan: 1. Uncontrolled type 2 diabetes mellitus with diabetic neuropathy, with long-term current use of insulin (HCC) Improved but not at goal.  Commended her on compliance with medications.  Encouraged her to continue to be compliant with taking her medicines.  Recommend increase Levemir to 53 units twice a day.  Continue healthy eating habits. She will call [facility] and have them send me results of her eye exam that she states was done this past summer - Insulin Detemir (LEVEMIR FLEXTOUCH) 100 UNIT/ML Pen; Inject 53 Units into the skin 2 (two) times daily.  Dispense: 15 mL; Refill: 11  2. Coronary artery disease involving native coronary artery of native heart  with angina pectoris (HCC) Advised increasing Imdur to 60 mg daily until she gets into see the cardiologist next wk. continue metoprolol, aspirin and Lipitor..  3. Hyperlipidemia associated with type 2 diabetes mellitus (HCC) Continue Lipitor  Follow Up Instructions: F/u in 2 mths and with the clinical pharmacist in 3 weeks   I discussed the assessment and treatment plan with the patient. The patient was provided an opportunity to ask questions and all were answered. The patient agreed with the plan and demonstrated an understanding of the instructions.   The patient was advised to call back or seek an in-person evaluation if the symptoms worsen or if the condition fails to improve as anticipated.  I provided  16 minutes of non-face-to-face time during this encounter.   Jonah Blue, MD

## 2019-07-14 NOTE — Progress Notes (Signed)
Pt states her blood sugar this morning was 147

## 2019-07-17 NOTE — Progress Notes (Signed)
Cardiology Office Note:    Date:  07/18/2019   ID:  [name], DOB 1971-08-16, MRN [id number]  PCP:  Marcine MatarJohnson, Deborah B, MD  Cardiologist:  No primary care provider on file.  Electrophysiologist:  None   Referring MD: Marcine MatarJohnson, Deborah B, MD   Chief Complaint  Patient presents with  . Chest Pain   History of Present Illness:    [name] is a 48 y.o. female with a hx of coronary artery disease, type 2 diabetes, hypertension, hyperlipidemia who presents for a follow-up evaluation.  [facility] with chest pain.  EKG was unremarkable and high-sensitivity troponins were negative.  However due to her extensive risk factors including uncontrolled diabetes, coronary CTA was ordered.  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  She was discharged on medical management: atorvastatin 80 mg, Imdur 30 mg.  TTE showed EF 60-65%.    After discharge from the hospital, she was seen in clinic on 06/29/2019.  She reported that she had been continuing to have chest pain occurring nearly daily, described as dull left-sided chest pain that occurs when she is stressed.  Pain resolved with nitroglycerin and she has been taking about 1 nitroglycerin per day.  Metoprolol 25 mg twice daily was added to her antianginal regimen, with plan to titrate metoprolol and Imdur as needed for anginal symptoms.  Since her last clinic visit, she reports that when she started the metoprolol she initially did not have any chest pain for about 4 days.  After that she reported that she began having sharp left-sided chest pain that worsened with exertion and resolved with nitroglycerin.   She saw Dr Laural BenesJohnson on Friday and recommended increasing Imdur to 60 mg daily.  She states that when she took the 60 mg of Imdur she had no chest pain, but did have a significant headache.  She started taking 30 mg twice daily and did not have a headache.  States that she continues to vape but has been cutting back.    Past Medical History:  Diagnosis Date  . Arthritis    fingers  . Cervical dysplasia   . Depression   . Diabetes mellitus   . Dyspnea 11/22/2013  . GERD (gastroesophageal reflux disease)   . High cholesterol   . Hypertension    no meds now  . Kidney stones   . PONV (postoperative nausea and vomiting)   . Sleep apnea    lost 70lbs no cpap x2050yrs now    Past Surgical History:  Procedure Laterality Date  . CARDIAC CATHETERIZATION     4-2765yrs ago  . CERVICAL CONIZATION W/BX N/A 05/04/2018   Procedure: CONIZATION CERVIX WITH BIOPSY - COLD KNIFE;  Surgeon: Hermina StaggersErvin, Michael L, MD;  Location: [facility];  Service: Gynecology;  Laterality: N/A;  . CHOLECYSTECTOMY    . ENDOMETRIAL ABLATION     6 years ago  . IRRIGATION AND DEBRIDEMENT ABSCESS Right 03/17/2017   Procedure: IRRIGATION AND DEBRIDEMENT RIGHT THIGH ABSCESS;  Surgeon: Karie SodaGross, Steven, MD;  Location: [facility];  Service: General;  Laterality: Right;  . LAPAROSCOPIC APPENDECTOMY  10/04/2011   Procedure: APPENDECTOMY LAPAROSCOPIC;  Surgeon: Rulon AbideBrian David Layton, DO;  Location: [facility];  Service: General;  Laterality: N/A;    Current Medications: Current Meds  Medication Sig  . aspirin EC 81 MG tablet Take 1 tablet (81 mg total) by mouth daily.  Marland Kitchen atorvastatin (LIPITOR) 80 MG tablet Take 1 tablet (80 mg total) by mouth daily at 6 PM.  . diclofenac sodium (VOLTAREN) 1 % GEL Apply 2 g topically 4 (four) times daily.  Marland Kitchen esomeprazole (NEXIUM) 20 MG capsule Take 20 mg by mouth daily.   Marland Kitchen FLUoxetine (PROZAC) 20 MG tablet 1 tab daily witht a 60 mg tab (Patient taking differently: Take 20 mg by mouth daily. (take with   to equal  total))  . FLUoxetine (PROZAC) 40 MG capsule Take one tab daily with a 20 mg daily (Patient taking differently: Take 40 mg by mouth daily. (take with  to equal  total))  . fluticasone (FLONASE) 50 MCG/ACT nasal spray Place 2 sprays into both nostrils daily as needed for allergies or rhinitis.  Marland Kitchen gabapentin (NEURONTIN) 300 MG capsule Take 3 capsules (900 mg total) by mouth 2 (two) times daily.  Marland Kitchen glucose blood (TRUE METRIX BLOOD GLUCOSE TEST) test strip Use as instructed  . Insulin Detemir (LEVEMIR FLEXTOUCH) 100 UNIT/ML Pen Inject 53 Units into the skin 2 (two) times daily.  . Insulin Pen Needle (B-D UF III MINI PEN NEEDLES) 31G X 5 MM MISC Use as instructed. Inject into the skin three times daily  . liraglutide (VICTOZA) 18 MG/3ML SOPN Inject 0.3 mLs (1.8 mg total) into the skin daily.  . metFORMIN (GLUCOPHAGE-XR) 500 MG 24 hr tablet Take 2 tablets (1,000 mg total) by mouth daily with breakfast. Take 2 tablets twice a day. (Patient taking differently: Take 1,000 mg by mouth daily with breakfast. )  . metoprolol tartrate (LOPRESSOR) 25 MG tablet Take 1 tablet (25 mg total) by mouth 2 (two) times daily.  . nitroGLYCERIN (NITROSTAT) 0.4 MG SL tablet Place 1 tablet (0.4 mg total) under the tongue every 5 (five) minutes as needed for chest pain.  . TRUEPLUS SAFETY LANCETS 28G MISC Use as directed  . [DISCONTINUED] fluticasone (FLONASE) 50 MCG/ACT nasal spray Place 2 sprays into both nostrils daily. (Patient taking differently: Place 2 sprays into both nostrils daily as needed. )     Allergies:   Codeine and Sulfa antibiotics   Social History   Socioeconomic History  . Marital status: Widowed    Spouse name: Not on file  . Number of children: 1  . Years of education: Not on file  . Highest education level: Not on file  Occupational History  . Occupation: none  Social Needs  . Financial resource strain: Not on file  . Food insecurity    Worry: Not on file    Inability:  Not on file  . Transportation needs    Medical: Not on file    Non-medical: Not on file  Tobacco Use  . Smoking status: Former Smoker    Packs/day: 0.00    Years: 24.00    Pack years: 0.00    Types: E-cigarettes  . Smokeless tobacco: Current User  . Tobacco comment: vape  Substance and Sexual Activity  . Alcohol use: Yes    Comment: rare  . Drug use: No  . Sexual activity: Yes    Birth control/protection: None  Lifestyle  . Physical activity    Days per week: Not on file    Minutes per session: Not on file  . Stress: Not on file  Relationships  .  Social Herbalist on phone: Not on file    Gets together: Not on file    Attends religious service: Not on file    Active member of club or organization: Not on file    Attends meetings of clubs or organizations: Not on file    Relationship status: Not on file  Other Topics Concern  . Not on file  Social History Narrative  . Not on file     Family History: The patient's family history includes Aneurysm in her mother; Breast cancer in her paternal grandmother; Heart attack in her father; Stroke in her father.  ROS:   Please see the history of present illness.     All other systems reviewed and are negative.  EKGs/Labs/Other Studies Reviewed:    The following studies were reviewed today:   EKG:  EKG is ordered today.  The ekg ordered today demonstrates normal sinus rhythm, rate 65, no ST/T abnormality  TTE 06/14/19:  1. Left ventricular ejection fraction, by visual estimation, is 60 to 65%. The left ventricle has normal function. Normal left ventricular size. There is no left ventricular hypertrophy.  2. Left ventricular diastolic Doppler parameters are consistent with impaired relaxation pattern of LV diastolic filling.  3. Global right ventricle has normal systolic function.The right ventricular size is normal. No increase in right ventricular wall thickness.  4. Left atrial size was normal.  5. Right atrial  size was normal.  6. The mitral valve is normal in structure. No evidence of mitral valve regurgitation. No evidence of mitral stenosis.  7. The tricuspid valve is normal in structure. Tricuspid valve regurgitation was not visualized by color flow Doppler.  8. The aortic valve is normal in structure. Aortic valve regurgitation was not visualized by color flow Doppler. Structurally normal aortic valve, with no evidence of sclerosis or stenosis.  9. The pulmonic valve was normal in structure. Pulmonic valve regurgitation is not visualized by color flow Doppler. 10. The inferior vena cava is normal in size with greater than 50% respiratory variability, suggesting right atrial pressure of 3 mmHg.  Coronary CT 06/15/19: 1. Coronary calcium score of 81. This was 27 percentile for age and sex matched control. 2. 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 Noncalcified plaque in mid LCX appears to cause severe stenosis (70-99%) but vessel is small (<11mm) 3. Calcified plaque in the proximal LAD causes 25-49% stenosis  CTFFR 06/15/19: 1. CT-FFR across lesion in proximal LAD is 0.92, suggesting lesion is not functionally significant 2. CT-FFR across lesion in ostial D2 is 0.62, suggesting functional significance 3. CT-FFR across lesion in anomalous circumflex is 0.65, suggesting functional significance  Recent Labs: 06/05/2019: ALT 17 06/13/2019: BUN 18; Potassium 4.2; Sodium 135 06/14/2019: Creatinine, Ser 0.97; Hemoglobin 14.0; Platelets 230  Recent Lipid Panel    Component Value Date/Time   CHOL 307 (H) 06/14/2019 0009   CHOL 283 (H) 06/05/2019 1039   TRIG 237 (H) 06/14/2019 0009   HDL 56 06/14/2019 0009   HDL 60 06/05/2019 1039   CHOLHDL 5.5 06/14/2019 0009   VLDL 47 (H) 06/14/2019 0009   LDLCALC 204 (H) 06/14/2019 0009   LDLCALC 165 (H) 06/05/2019 1039    Physical Exam:    VS:  BP 124/80   Pulse 65   Ht 5\' 9"  (1.753 m)   Wt  239 lb 3.2 oz (108.5 kg)   BMI 35.32 kg/m     Wt Readings from Last  3 Encounters:  07/18/19 239 lb 3.2 oz (108.5 kg)  06/29/19 235 lb 6.4 oz (106.8 kg)  06/15/19 225 lb 12.8 oz (102.4 kg)     GEN:  Well nourished, well developed in no acute distress HEENT: Normal NECK: No JVD; No carotid bruits LYMPHATICS: No lymphadenopathy CARDIAC: RRR, no murmurs, rubs, gallops RESPIRATORY:  Clear to auscultation without rales, wheezing or rhonchi  ABDOMEN: Soft, non-tender, non-distended MUSCULOSKELETAL:  No edema; No deformity  SKIN: Warm and dry NEUROLOGIC:  Alert and oriented x 3 PSYCHIATRIC:  Normal affect   ASSESSMENT:    1. Coronary artery disease of native artery of native heart with stable angina pectoris (HCC)   2. Hyperlipidemia, unspecified hyperlipidemia type   3. Tobacco use    PLAN:    Coronary artery disease with stable angina: Coronary CTA indicates severe obstructive disease in anomalous circumflex and second diagonal branch.  These are both small vessels and medical management is recommended.  Normal LV systolic function.  She continues to have chest pain that improves with nitroglycerin -Continue aspirin 81 mg daily  -Continue atorvastatin 80 mg daily -Continue metoprolol 25 mg twice daily -Agree with increasing Imdur to 60 mg daily.  She does report a headache on the 60 mg dose so started taking 30 mg twice daily.  Advised that Imdur should just be taken once daily to maintain a nitrate free interval and avoid development of tolerance.  She will try Imdur 60 mg daily for next 3 days, but if continues to have headaches can decrease Imdur back to 30 mg daily and instead titrate up her metoprolol to 50 mg twice daily -Continue as needed nitroglycerin   Hyperlipidemia: LDL 204 on 06/14/19.  Started on atorvastatin 80 mg at that time.  Will plan to recheck lipid panel in 2 months   Tobacco use: smoked 2ppd x 30 years, has quit smoking but now vaping.  Discussed risks of  vaping and cessation strongly recommended  Type 2 diabetes: A1c 12.2 on 06/05/19. On insulin, victoza, metformin  RTC in 2 months   Medication Adjustments/Labs and Tests Ordered: Current medicines are reviewed at length with the patient today.  Concerns regarding medicines are outlined above.  Orders Placed This Encounter  Procedures  . Lipid Profile   Meds ordered this encounter  Medications  . isosorbide mononitrate (IMDUR) 60 MG 24 hr tablet    Sig: Take 1 tablet (60 mg total) by mouth daily.    Dispense:  90 tablet    Refill:  3    NEW DOSE, D/C PREVIOUS RX    Patient Instructions  Medication Instructions:  CONTINUE THE ISOSORBIDE 60 MG DAILY   *If you need a refill on your cardiac medications before your next appointment, please call your pharmacy*  Lab Work: FASTING LIPID PRIOR TO YOUR FOLLOW UP   If you have labs (blood work) drawn today and your tests are completely normal, you will receive your results only by: Marland Kitchen MyChart Message (if you have MyChart) OR . A paper copy in the mail If you have any lab test that is abnormal or we need to change your treatment, we will call you to review the results.  Testing/Procedures: NONE   Follow-Up: At [facility], you and your health needs are our priority.  As part of our continuing mission to provide you with exceptional heart care, we have created designated Provider Care Teams.  These Care Teams include your primary Cardiologist (physician) and Advanced Practice Providers (APPs -  Physician Assistants and Nurse Practitioners) who all work together to provide you with the care you need, when you need it.  Your next appointment:   2 MONTHS   The format for your next appointment:   In Person  Provider:   You may see DR [facility] or one of the following Advanced Practice Providers on your designated Care Team:    Theodore DemarkRhonda Barrett, PA-C  Joni ReiningKathryn Lawrence, DNP, ANP  Cadence Fransico MichaelFurth, NP       Signed, [place] L  Isha Seefeld, MD  07/18/2019 8:54 AM    [facility]

## 2019-07-18 ENCOUNTER — Other Ambulatory Visit: Payer: Self-pay

## 2019-07-18 ENCOUNTER — Encounter: Payer: Self-pay | Admitting: Cardiology

## 2019-07-18 ENCOUNTER — Ambulatory Visit (INDEPENDENT_AMBULATORY_CARE_PROVIDER_SITE_OTHER): Payer: Self-pay | Admitting: Cardiology

## 2019-07-18 VITALS — BP 124/80 | HR 65 | Ht 69.0 in | Wt 239.2 lb

## 2019-07-18 DIAGNOSIS — Z720 Tobacco use: Secondary | ICD-10-CM

## 2019-07-18 DIAGNOSIS — I25118 Atherosclerotic heart disease of native coronary artery with other forms of angina pectoris: Secondary | ICD-10-CM

## 2019-07-18 DIAGNOSIS — E785 Hyperlipidemia, unspecified: Secondary | ICD-10-CM

## 2019-07-18 MED ORDER — ISOSORBIDE MONONITRATE ER 60 MG PO TB24: 60.0000 mg | ORAL_TABLET | Freq: Every day | ORAL | 3 refills | Status: DC

## 2019-07-18 NOTE — Patient Instructions (Signed)
Medication Instructions:  CONTINUE THE ISOSORBIDE 60 MG DAILY   *If you need a refill on your cardiac medications before your next appointment, please call your pharmacy*  Lab Work: FASTING LIPID PRIOR TO YOUR FOLLOW UP   If you have labs (blood work) drawn today and your tests are completely normal, you will receive your results only by: Marland Kitchen MyChart Message (if you have MyChart) OR . A paper copy in the mail If you have any lab test that is abnormal or we need to change your treatment, we will call you to review the results.  Testing/Procedures: NONE   Follow-Up: At [facility], you and your health needs are our priority.  As part of our continuing mission to provide you with exceptional heart care, we have created designated Provider Care Teams.  These Care Teams include your primary Cardiologist (physician) and Advanced Practice Providers (APPs -  Physician Assistants and Nurse Practitioners) who all work together to provide you with the care you need, when you need it.  Your next appointment:   2 MONTHS   The format for your next appointment:   In Person  Provider:   You may see DR [facility] or one of the following Advanced Practice Providers on your designated Care Team:    Rosaria Ferries, PA-C  Jory Sims, DNP, ANP  Cadence Kathlen Mody, NP

## 2019-07-18 NOTE — Addendum Note (Signed)
Addended by: Alvina Filbert B on: 07/18/2019 09:08 AM   Modules accepted: Orders

## 2019-07-21 LAB — FECAL OCCULT BLOOD, IMMUNOCHEMICAL

## 2019-07-31 ENCOUNTER — Ambulatory Visit: Payer: Self-pay | Admitting: Cardiology

## 2019-07-31 MED FILL — ISOSORBIDE MN ER 60 MG TAB: 60 | 30 days supply | Qty: 30 | Fill #0

## 2019-07-31 MED FILL — ?METOPROLOL 25 MG TABLET: 25 | 30 days supply | Qty: 60 | Fill #1

## 2019-08-21 MED FILL — GABAPENTIN 300 MG CAPSULE: 300 | 30 days supply | Qty: 180 | Fill #3

## 2019-08-21 MED FILL — METFORMIN HCL ER 500 MG TB2: 500 | 30 days supply | Qty: 60 | Fill #2

## 2019-08-21 MED FILL — $VICTOZA 2-PAK 18MG/3ML PEN: 18 | 30 days supply | Qty: 9 | Fill #1

## 2019-08-21 MED FILL — ISOSORBIDE MN ER 60 MG TAB: 60 | 30 days supply | Qty: 30 | Fill #1

## 2019-08-21 MED FILL — FLUoxetine HCL 40 MG CAPS: 40 | 30 days supply | Qty: 30 | Fill #2

## 2019-08-21 MED FILL — ATORVASTATIN 80 MG TABLET: 80 | 30 days supply | Qty: 30 | Fill #1

## 2019-08-21 MED FILL — ?METOPROLOL 25 MG TABLET: 25 | 30 days supply | Qty: 60 | Fill #2

## 2019-08-21 MED FILL — FLUoxetine HCL 20 MG TABS: 20 | 30 days supply | Qty: 30 | Fill #2

## 2019-09-01 DIAGNOSIS — H2513 Age-related nuclear cataract, bilateral: Secondary | ICD-10-CM

## 2019-09-01 HISTORY — DX: Age-related nuclear cataract, bilateral: H25.13

## 2019-09-07 ENCOUNTER — Telehealth (HOSPITAL_BASED_OUTPATIENT_CLINIC_OR_DEPARTMENT_OTHER): Payer: Medicaid Other | Admitting: Physician Assistant

## 2019-09-07 DIAGNOSIS — J9801 Acute bronchospasm: Secondary | ICD-10-CM

## 2019-09-07 DIAGNOSIS — J069 Acute upper respiratory infection, unspecified: Secondary | ICD-10-CM

## 2019-09-07 DIAGNOSIS — Z20822 Contact with and (suspected) exposure to covid-19: Secondary | ICD-10-CM

## 2019-09-07 DIAGNOSIS — E1100 Type 2 diabetes mellitus with hyperosmolarity without nonketotic hyperglycemic-hyperosmolar coma (NKHHC): Secondary | ICD-10-CM

## 2019-09-07 DIAGNOSIS — R110 Nausea: Secondary | ICD-10-CM

## 2019-09-07 MED ORDER — ALBUTEROL SULFATE HFA 108 (90 BASE) MCG/ACT IN AERS: 2.0000 | INHALATION_SPRAY | Freq: Four times a day (QID) | RESPIRATORY_TRACT | 0 refills | Status: DC | PRN

## 2019-09-07 MED ORDER — BENZONATATE 100 MG PO CAPS: 100.0000 mg | ORAL_CAPSULE | Freq: Two times a day (BID) | ORAL | 0 refills | Status: DC | PRN

## 2019-09-07 MED ORDER — AZITHROMYCIN 250 MG PO TABS: ORAL_TABLET | ORAL | 0 refills | Status: DC

## 2019-09-07 MED ORDER — ONDANSETRON HCL 4 MG PO TABS: 4.0000 mg | ORAL_TABLET | Freq: Three times a day (TID) | ORAL | 0 refills | Status: DC | PRN

## 2019-09-07 MED FILL — ALBUTEROL SULFATE HFA 108 (: 108 (90 BAS | 25 days supply | Qty: 18 | Fill #0

## 2019-09-07 MED FILL — AZITHROMYCIN 250 MG TABLET: 250 | 5 days supply | Qty: 6 | Fill #0

## 2019-09-07 MED FILL — ?ONDANSETRON HCL 4 MG TABLE: 4 | 6 days supply | Qty: 20 | Fill #0

## 2019-09-07 MED FILL — BENZONATATE 100 MG CAPS: 100 | 10 days supply | Qty: 20 | Fill #0

## 2019-09-07 NOTE — Progress Notes (Signed)
Virtual Visit via Video Note  I connected with [name] on 09/07/19 at  8:30 AM EST by a video enabled telemedicine application and verified that I am speaking with the correct person using two identifiers.   I discussed the limitations of evaluation and management by telemedicine and the availability of in person appointments. The patient expressed understanding and agreed to proceed.  Patient location:  home My Location:  [facility] office Persons on the video:  Me and the patient    History of Present Illness:  Patient started having sneezing Friday, Jan 1,2021.  2 days late developed body aches, nausea, wheezing, cough, poor appetite, fatigue, HA, congestion, ear pain, hoarse voice.  Feels generally lousy.  Temps around 100.  No respiratory distress but she does get short of breath when up and walking around.  She is drinking gatorade zero.  She is not checking her blood sugars.   No vomiting.  No diarrhea.  Some mucus she coughs up is green.  She did mobile covid testing yesterday and is awaiting results  No travel.  Son is disabled and they have caregivers coming in and out of the house.  At least one of the caregivers is caring for 2 patients that have covid.      Observations/Objective: A&Ox3.  Looks ill but not toxic.  No RD.  Voice is hoarse.  Assessment and Plan: 1. Viral upper respiratory tract infection Suspected Covid-fluid and respiratory care discussed at length.   - azithromycin (ZITHROMAX) 250 MG tablet; Take 2 today then 1 daily  Dispense: 6 tablet; Refill: 0 - benzonatate (TESSALON) 100 MG capsule; Take 1 capsule (100 mg total) by mouth 2 (two) times daily as needed for cough.  Dispense: 20 capsule; Refill: 0  2. Nausea Fortunately no vomiting or diarrhea - ondansetron (ZOFRAN) 4 MG tablet; Take 1 tablet (4 mg total) by mouth every 8 (eight) hours as needed for nausea or vomiting.  Dispense: 20 tablet; Refill: 0  3. Bronchospasm - albuterol (VENTOLIN HFA) 108 (90  Base) MCG/ACT inhaler; Inhale 2 puffs into the lungs every 6 (six) hours as needed for wheezing or shortness of breath.  Dispense: 18 g; Refill: 0 -to ED/call 911 if RD deveops.  Patient verbalizes understanding  4. Uncontrolled type 2 diabetes mellitus with hyperosmolar nonketotic hyperglycemia (HCC) MUST check blood sugars.  Patient education about illness and how it effects blood sugars and diabetics in general.  Discussed taking meds and eating frequent small meals along with adequate water intake.  Good nutrition and glycemic control imperative to recovery.  To ED if blood sugars consistently higher than 300 or symptomatic.  Patient verbalizes understanding.    5. Suspected COVID-19 virus infection See all of the above.  Quarantine.      Follow Up Instructions: See PCP in 4-6 weeks   I discussed the assessment and treatment plan with the patient. The patient was provided an opportunity to ask questions and all were answered. The patient agreed with the plan and demonstrated an understanding of the instructions.   The patient was advised to call back or seek an in-person evaluation if the symptoms worsen or if the condition fails to improve as anticipated.  I provided 17 minutes of non-face-to-face time during this encounter.   Georgian Co, PA-C  Patient ID: [name], female   DOB: [birth year], 49 y.o.   MRN: [id number]

## 2019-09-21 ENCOUNTER — Other Ambulatory Visit: Payer: Self-pay | Admitting: Physician Assistant

## 2019-09-21 DIAGNOSIS — J069 Acute upper respiratory infection, unspecified: Secondary | ICD-10-CM

## 2019-09-21 DIAGNOSIS — J9801 Acute bronchospasm: Secondary | ICD-10-CM

## 2019-09-25 MED FILL — METOPROLOL TARTRATE 25 MG T: 25 | 30 days supply | Qty: 60 | Fill #3

## 2019-09-25 MED FILL — FLUoxetine HCL 20 MG CAPS: 20 | 30 days supply | Qty: 30 | Fill #0

## 2019-09-25 MED FILL — GABAPENTIN 300 MG CAPSULE: 300 | 30 days supply | Qty: 180 | Fill #4

## 2019-09-25 MED FILL — ISOSORBIDE MN ER 60 MG TAB: 60 | 30 days supply | Qty: 30 | Fill #2

## 2019-09-25 MED FILL — FLUoxetine HCL 40 MG CAPS: 40 | 30 days supply | Qty: 30 | Fill #1

## 2019-09-25 MED FILL — ATORVASTATIN 80 MG TABLET: 80 | 30 days supply | Qty: 30 | Fill #2

## 2019-09-25 MED FILL — metFORMIN HCL ER 500 MG TB2: 500 | 30 days supply | Qty: 60 | Fill #3

## 2019-09-28 LAB — HM DIABETES EYE EXAM

## 2019-09-30 NOTE — H&P (View-Only) (Signed)
Cardiology Office Note:    Date:  10/02/2019   ID:  [name], DOB 26-Jun-1971, MRN [id number]  PCP:  Marcine Matar, MD  Cardiologist:  No primary care provider on file.  Electrophysiologist:  None   Referring MD: Marcine Matar, MD   Chief Complaint  Patient presents with  . Chest Pain   History of Present Illness:    [name] is a 49 y.o. female with a hx of coronary artery disease, type 2 diabetes, hypertension, hyperlipidemia who presents for a follow-up evaluation.  Patient was admitted to [facility] on 06/13/19 with chest pain.  EKG was unremarkable and high-sensitivity troponins were negative.  However due to her extensive risk factors including uncontrolled diabetes, coronary CTA was ordered.  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  She was discharged on medical management: atorvastatin 80 mg, Imdur 30 mg.  TTE showed EF 60-65%.    After discharge from the hospital, she was seen in clinic on 06/29/2019.  She reported that she had been continuing to have chest pain occurring nearly daily, described as dull left-sided chest pain that occurs when she is stressed.  Pain resolved with nitroglycerin and she has been taking about 1 nitroglycerin per day.  Metoprolol 25 mg twice daily was added to her antianginal regimen, with plan to titrate metoprolol and Imdur as needed for anginal symptoms.  At subsequent visit, continued to have chest pain and imdur was increased to 60 mg daily.    Since last clinic visit, she has been taking NTG every other day for chest pain.  Had COVID at the beginning of January.  She reports that since she had infection she is significantly  fatigued.  Reports new dyspnea on exertion, even walking across room causes her to be short of breath.  Reports chest pain which she describes as squeezing on left side of chest radiating down left arm.  Occurring every 2 days, lasts 10-15 minutes, resolves with 1-2 NTG.  States that chest pain has been causing her significant distress and limits her activity.     Past Medical History:  Diagnosis Date  . Arthritis    fingers  . Cervical dysplasia   . Depression   . Diabetes mellitus   . Dyspnea 11/22/2013  . GERD (gastroesophageal reflux disease)   . High cholesterol   . Hypertension    no meds now  . Kidney stones   . PONV (postoperative nausea and vomiting)   . Sleep apnea    lost 70lbs no cpap x52yrs now    Past Surgical History:  Procedure Laterality Date  . CARDIAC CATHETERIZATION     4-63yrs ago  . CERVICAL CONIZATION W/BX N/A 05/04/2018   Procedure: CONIZATION CERVIX WITH BIOPSY - COLD KNIFE;  Surgeon: Hermina Staggers, MD;  Location: [facility];  Service: Gynecology;  Laterality: N/A;  . CHOLECYSTECTOMY    . ENDOMETRIAL ABLATION     6 years ago  . IRRIGATION AND DEBRIDEMENT ABSCESS Right 03/17/2017   Procedure: IRRIGATION AND DEBRIDEMENT RIGHT THIGH ABSCESS;  Surgeon: Karie Soda, MD;  Location: [facility];  Service: General;  Laterality: Right;  . LAPAROSCOPIC APPENDECTOMY  10/04/2011   Procedure: APPENDECTOMY LAPAROSCOPIC;  Surgeon: Rulon Abide, DO;  Location: [facility];  Service: General;  Laterality: N/A;    Current Medications: Current Meds  Medication Sig  . albuterol (VENTOLIN HFA) 108 (90 Base) MCG/ACT inhaler Inhale 2 puffs into the lungs every 6 (six) hours as needed for wheezing or shortness of breath. (Patient not taking: Reported on 10/02/2019)  . aspirin EC 81 MG tablet Take 1 tablet (81 mg total) by mouth daily.  Marland Kitchen atorvastatin (LIPITOR) 80 MG tablet Take 1 tablet (80 mg total) by mouth daily at 6 PM.  . azithromycin (ZITHROMAX) 250 MG tablet  Take 2 today then 1 daily (Patient not taking: Reported on 10/02/2019)  . benzonatate (TESSALON) 100 MG capsule Take 1 capsule (100 mg total) by mouth 2 (two) times daily as needed for cough. (Patient not taking: Reported on 10/02/2019)  . esomeprazole (NEXIUM) 20 MG capsule Take 20 mg by mouth daily.   Marland Kitchen FLUoxetine (PROZAC) 20 MG tablet 1 tab daily witht a 60 mg tab (Patient taking differently: Take 20 mg by mouth daily. (take with 40mg  to equal 60mg  total))  . FLUoxetine (PROZAC) 40 MG capsule Take one tab daily with a 20 mg daily (Patient taking differently: Take 40 mg by mouth daily. (take with 20mg  to equal 60mg  total))  . fluticasone (FLONASE) 50 MCG/ACT nasal spray Place 2 sprays into both nostrils daily as needed for allergies or rhinitis.  gabapentin (NEURONTIN) 300 MG capsule Take 3 capsules (900 mg total) by mouth 2 (two) times daily.  glucose blood (TRUE METRIX BLOOD GLUCOSE TEST) test strip Use as instructed  . Insulin Detemir (LEVEMIR FLEXTOUCH) 100 UNIT/ML Pen Inject 53 Units into the skin 2 (two) times daily.  . Insulin Pen Needle (B-D UF III MINI PEN NEEDLES) 31G X 5 MM MISC Use as instructed. Inject into the skin three times daily  . isosorbide mononitrate (IMDUR) 60 MG 24 hr tablet Take 1 tablet (60 mg total) by mouth daily.  liraglutide (VICTOZA) 18 MG/3ML SOPN Inject 0.3 mLs (1.8 mg total) into the skin daily. (Patient taking differently: Inject 1.8 mg into the skin every evening. )  . metFORMIN (GLUCOPHAGE-XR) 500 MG 24 hr tablet Take 2 tablets (1,000 mg total) by mouth daily with breakfast. Take 2 tablets twice a day. (Patient taking differently: Take 1,000 mg by mouth daily with breakfast. )  . nitroGLYCERIN (NITROSTAT) 0.4 MG SL tablet Place 1 tablet (0.4 mg total) under the tongue every 5 (five) minutes as needed for chest pain.  ondansetron (ZOFRAN) 4 MG tablet Take 1 tablet (4 mg total) by mouth every 8 (eight) hours as needed for nausea or vomiting. (Patient not taking:  Reported on 10/02/2019)  . TRUEPLUS SAFETY LANCETS 28G MISC Use as directed  . [DISCONTINUED] diclofenac sodium (VOLTAREN) 1 % GEL Apply 2 g topically 4 (four) times daily.     Allergies:   Codeine and Sulfa antibiotics   Social History   Socioeconomic History  . Marital status: Widowed    Spouse name: Not on file  . Number of children: 1  . Years of education: Not on file  . Highest education level: Not on file  Occupational History  . Occupation: none  Tobacco Use  . Smoking status: Former Smoker    Packs/day: 0.00    Years: 24.00    Pack years: 0.00    Types: E-cigarettes  . Smokeless tobacco: Current User  . Tobacco comment: vape  Substance and Sexual Activity  . Alcohol  use: Yes    Comment: rare  . Drug use: No  . Sexual activity: Yes    Birth control/protection: None  Other Topics Concern  . Not on file  Social History Narrative  . Not on file   Social Determinants of Health   Financial Resource Strain:   . Difficulty of Paying Living Expenses: Not on file  Food Insecurity:   . Worried About Programme researcher, broadcasting/film/video in the Last Year: Not on file  . Ran Out of Food in the Last Year: Not on file  Transportation Needs:   . Lack of Transportation (Medical): Not on file  . Lack of Transportation (Non-Medical): Not on file  Physical Activity:   . Days of Exercise per Week: Not on file  . Minutes of Exercise per Session: Not on file  Stress:   . Feeling of Stress : Not on file  Social Connections:   . Frequency of Communication with Friends and Family: Not on file  . Frequency of Social Gatherings with Friends and Family: Not on file  . Attends Religious Services: Not on file  . Active Member of Clubs or Organizations: Not on file  . Attends Banker Meetings: Not on file  . Marital Status: Not on file     Family History: The patient's family history includes Aneurysm in her mother; Breast cancer in her paternal grandmother; Heart attack in her father;  Stroke in her father.  ROS:   Please see the history of present illness.     All other systems reviewed and are negative.  EKGs/Labs/Other Studies Reviewed:    The following studies were reviewed today:   EKG:  EKG is ordered today.  The ekg ordered today demonstrates normal sinus rhythm, rate 85, no ST/T abnormality  TTE 06/14/19:  1. Left ventricular ejection fraction, by visual estimation, is 60 to 65%. The left ventricle has normal function. Normal left ventricular size. There is no left ventricular hypertrophy.  2. Left ventricular diastolic Doppler parameters are consistent with impaired relaxation pattern of LV diastolic filling.  3. Global right ventricle has normal systolic function.The right ventricular size is normal. No increase in right ventricular wall thickness.  4. Left atrial size was normal.  5. Right atrial size was normal.  6. The mitral valve is normal in structure. No evidence of mitral valve regurgitation. No evidence of mitral stenosis.  7. The tricuspid valve is normal in structure. Tricuspid valve regurgitation was not visualized by color flow Doppler.  8. The aortic valve is normal in structure. Aortic valve regurgitation was not visualized by color flow Doppler. Structurally normal aortic valve, with no evidence of sclerosis or stenosis.  9. The pulmonic valve was normal in structure. Pulmonic valve regurgitation is not visualized by color flow Doppler. 10. The inferior vena cava is normal in size with greater than 50% respiratory variability, suggesting right atrial pressure of 3 mmHg.  Coronary CT 06/15/19: 1. Coronary calcium score of 81. This was 3 percentile for age and sex matched control. 2. 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 Noncalcified plaque in mid LCX appears to cause severe stenosis (70-99%) but vessel is small (<16mm) 3. Calcified plaque in the proximal LAD causes 25-49%  stenosis  CTFFR 06/15/19: 1. CT-FFR across lesion in proximal LAD is 0.92, suggesting lesion is not functionally significant 2. CT-FFR across lesion in ostial D2 is 0.62, suggesting functional significance 3. CT-FFR across lesion in anomalous circumflex is  0.65, suggesting functional significance  Recent Labs: 06/05/2019: ALT 17 06/13/2019: BUN 18; Potassium 4.2; Sodium 135 06/14/2019: Creatinine, Ser 0.97; Hemoglobin 14.0; Platelets 230  Recent Lipid Panel    Component Value Date/Time   CHOL 307 (H) 06/14/2019 0009   CHOL 283 (H) 06/05/2019 1039   TRIG 237 (H) 06/14/2019 0009   HDL 56 06/14/2019 0009   HDL 60 06/05/2019 1039   CHOLHDL 5.5 06/14/2019 0009   VLDL 47 (H) 06/14/2019 0009   LDLCALC 204 (H) 06/14/2019 0009   LDLCALC 165 (H) 06/05/2019 1039    Physical Exam:    VS:  BP (!) 86/63   Pulse 85   Temp (!) 94.3 F (34.6 C)   Ht 5\' 9"  (1.753 m)   Wt 232 lb (105.2 kg)   BMI 34.26 kg/m     Wt Readings from Last 3 Encounters:  10/02/19 232 lb (105.2 kg)  07/18/19 239 lb 3.2 oz (108.5 kg)  06/29/19 235 lb 6.4 oz (106.8 kg)  Recheck BP 100/70  GEN:  Well nourished, well developed in no acute distress HEENT: Normal NECK: No JVD; No carotid bruits LYMPHATICS: No lymphadenopathy CARDIAC: RRR, no murmurs, rubs, gallops RESPIRATORY:  Clear to auscultation without rales, wheezing or rhonchi  ABDOMEN: Soft, non-tender, non-distended MUSCULOSKELETAL:  No edema; No deformity  SKIN: Warm and dry NEUROLOGIC:  Alert and oriented x 3 PSYCHIATRIC:  Normal affect   ASSESSMENT:    1. Coronary artery disease of native artery of native heart with stable angina pectoris ([place])   2. Hyperlipidemia, unspecified hyperlipidemia type   3. Pre-procedural laboratory examination   4. Tobacco use    PLAN:    Coronary artery disease: Coronary CTA indicates severe obstructive disease in anomalous circumflex and second diagonal branch.  These are both small vessels and trial of  medical management was recommended.  Normal LV systolic function.   - Given that she continues to have significant chest pain despite maximally tolerated antianginal regimen (low BP limits further titration), recommend cath for further evaluation to determine if any lesions amenable to intervention.  Risks and benefits of cardiac catheterization have been discussed with the patient.  These include bleeding, infection, kidney damage, stroke, heart attack, death.  The patient understands these risks and is willing to proceed. -Continue aspirin 81 mg daily  -Continue atorvastatin 80 mg daily -Continue metoprolol 25 mg twice daily -Continue  Imdur 60 mg daily. -Continue as needed nitroglycerin   Hyperlipidemia: LDL 204 on 06/14/19.  Started on atorvastatin 80 mg at that time.  Will recheck lipid panel  Tobacco use: smoked 2ppd x 30 years, has quit smoking but now vaping.  Discussed risks of vaping and cessation strongly recommended  Type 2 diabetes: A1c 12.2 on 06/05/19. On insulin, victoza, metformin  RTC in 1 month   Medication Adjustments/Labs and Tests Ordered: Current medicines are reviewed at length with the patient today.  Concerns regarding medicines are outlined above.  Orders Placed This Encounter  Procedures  . Basic metabolic panel  . CBC  . EKG 12-Lead   No orders of the defined types were placed in this encounter.   Patient Instructions  Medication Instructions:  Your physician recommends that you continue on your current medications as directed. Please refer to the Current Medication list given to you today.  *If you need a refill on your cardiac medications before your next appointment, please call your pharmacy*  Lab Work: TODAY (BMET, CBC, Lipid) If you have labs (blood work) drawn today and  your tests are completely normal, you will receive your results only by: . MyChart Message (if you have MyChart) OR . A paper copy in the mail If you have any lab test that is  abnormal or we need to change your treatment, we will call you to review the results.  Testing/Procedures: Your physician has requested that you have a cardiac catheterization. Cardiac catheterization is used to diagnose and/or treat various heart conditions. Doctors may recommend this procedure for a number of different reasons. The most common reason is to evaluate chest pain. Chest pain can be a symptom of coronary artery disease (CAD), and cardiac catheterization can show whether plaque is narrowing or blocking your heart's arteries. This procedure is also used to evaluate the valves, as well as measure the blood flow and oxygen levels in different parts of your heart. For further information please visit www.cardiosmart.org. Please follow instruction sheet, as given.  Follow-Up: At [facility], you and your health needs are our priority.  As part of our continuing mission to provide you with exceptional heart care, we have created designated Provider Care Teams.  These Care Teams include your primary Cardiologist (physician) and Advanced Practice Providers (APPs -  Physician Assistants and Nurse Practitioners) who all work together to provide you with the care you need, when you need it.  Your next appointment:   1 month(s)  The format for your next appointment:   In Person  Provider:   Equilla Que, MD  Other Instructions     [facility] [address] [place] [address] Dept: [phone] Loc: [phone]  Chantil C Tullo  10/02/2019  You are scheduled for a Cardiac Catheterization on Friday, February 5 with Dr. Peter Jordan.  1. Please arrive at the North Tower (Main Entrance A) at [facility]: [address] [place], [place] [address] at 5:30 AM (This time is two hours before your procedure to ensure your preparation). Free valet parking service is available.    Special note: Every effort is made to have your procedure done on time. Please understand that emergencies sometimes delay scheduled procedures.  2. Diet: Do not eat solid foods after midnight.  The patient may have clear liquids until 5am upon the day of the procedure.  3. Labs: You will need to have blood drawn on Monday, February 1 at [facility] [address], [place]  Open: 8am - 5pm (Lunch 12:30 - 1:30)   Phone: [phone]. You do not need to be fasting.  Covid test to be scanned into chart-another test not needed.   4. Medication instructions in preparation for your procedure:   Contrast Allergy: No  Take 1/2 dose of Insulin the night before-No insulin the day of.  Do not take Diabetes Med Glucophage (Metformin) on the day of the procedure and HOLD 48 HOURS AFTER THE PROCEDURE.  On the morning of your procedure, take your Aspirin and any morning medicines NOT listed above.  You may use sips of water.  5. Plan for one night stay--bring personal belongings. 6. Bring a current list of your medications and current insurance cards. 7. You MUST have a responsible person to drive you home. 8. Someone MUST be with you the first 24 hours after you arrive home or your discharge will be delayed. 9. Please wear clothes that are easy to get on and off and wear slip-on shoes.  Thank you for allowing us to care for   you!   -- [facility]      Signed, Little Ishikawa, MD  10/02/2019 11:12 PM    [facility]

## 2019-09-30 NOTE — Progress Notes (Signed)
Cardiology Office Note:    Date:  10/02/2019   ID:  [name], DOB 26-Jun-1971, MRN [id number]  PCP:  Marcine Matar, MD  Cardiologist:  No primary care provider on file.  Electrophysiologist:  None   Referring MD: Marcine Matar, MD   Chief Complaint  Patient presents with  . Chest Pain   History of Present Illness:    [name] is a 49 y.o. female with a hx of coronary artery disease, type 2 diabetes, hypertension, hyperlipidemia who presents for a follow-up evaluation.  Patient was admitted to [facility] on 06/13/19 with chest pain.  EKG was unremarkable and high-sensitivity troponins were negative.  However due to her extensive risk factors including uncontrolled diabetes, coronary CTA was ordered.  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  She was discharged on medical management: atorvastatin 80 mg, Imdur 30 mg.  TTE showed EF 60-65%.    After discharge from the hospital, she was seen in clinic on 06/29/2019.  She reported that she had been continuing to have chest pain occurring nearly daily, described as dull left-sided chest pain that occurs when she is stressed.  Pain resolved with nitroglycerin and she has been taking about 1 nitroglycerin per day.  Metoprolol 25 mg twice daily was added to her antianginal regimen, with plan to titrate metoprolol and Imdur as needed for anginal symptoms.  At subsequent visit, continued to have chest pain and imdur was increased to 60 mg daily.    Since last clinic visit, she has been taking NTG every other day for chest pain.  Had COVID at the beginning of January.  She reports that since she had infection she is significantly  fatigued.  Reports new dyspnea on exertion, even walking across room causes her to be short of breath.  Reports chest pain which she describes as squeezing on left side of chest radiating down left arm.  Occurring every 2 days, lasts 10-15 minutes, resolves with 1-2 NTG.  States that chest pain has been causing her significant distress and limits her activity.     Past Medical History:  Diagnosis Date  . Arthritis    fingers  . Cervical dysplasia   . Depression   . Diabetes mellitus   . Dyspnea 11/22/2013  . GERD (gastroesophageal reflux disease)   . High cholesterol   . Hypertension    no meds now  . Kidney stones   . PONV (postoperative nausea and vomiting)   . Sleep apnea    lost 70lbs no cpap x52yrs now    Past Surgical History:  Procedure Laterality Date  . CARDIAC CATHETERIZATION     4-63yrs ago  . CERVICAL CONIZATION W/BX N/A 05/04/2018   Procedure: CONIZATION CERVIX WITH BIOPSY - COLD KNIFE;  Surgeon: Hermina Staggers, MD;  Location: [facility];  Service: Gynecology;  Laterality: N/A;  . CHOLECYSTECTOMY    . ENDOMETRIAL ABLATION     6 years ago  . IRRIGATION AND DEBRIDEMENT ABSCESS Right 03/17/2017   Procedure: IRRIGATION AND DEBRIDEMENT RIGHT THIGH ABSCESS;  Surgeon: Karie Soda, MD;  Location: [facility];  Service: General;  Laterality: Right;  . LAPAROSCOPIC APPENDECTOMY  10/04/2011   Procedure: APPENDECTOMY LAPAROSCOPIC;  Surgeon: Rulon Abide, DO;  Location: [facility];  Service: General;  Laterality: N/A;    Current Medications: Current Meds  Medication Sig  . albuterol (VENTOLIN HFA) 108 (90 Base) MCG/ACT inhaler Inhale 2 puffs into the lungs every 6 (six) hours as needed for wheezing or shortness of breath. (Patient not taking: Reported on 10/02/2019)  . aspirin EC 81 MG tablet Take 1 tablet (81 mg total) by mouth daily.  Marland Kitchen atorvastatin (LIPITOR) 80 MG tablet Take 1 tablet (80 mg total) by mouth daily at 6 PM.  . azithromycin (ZITHROMAX) 250 MG tablet  Take 2 today then 1 daily (Patient not taking: Reported on 10/02/2019)  . benzonatate (TESSALON) 100 MG capsule Take 1 capsule (100 mg total) by mouth 2 (two) times daily as needed for cough. (Patient not taking: Reported on 10/02/2019)  . esomeprazole (NEXIUM) 20 MG capsule Take 20 mg by mouth daily.   Marland Kitchen FLUoxetine (PROZAC) 20 MG tablet 1 tab daily witht a 60 mg tab (Patient taking differently: Take 20 mg by mouth daily. (take with 40mg  to equal 60mg  total))  . FLUoxetine (PROZAC) 40 MG capsule Take one tab daily with a 20 mg daily (Patient taking differently: Take 40 mg by mouth daily. (take with 20mg  to equal 60mg  total))  . fluticasone (FLONASE) 50 MCG/ACT nasal spray Place 2 sprays into both nostrils daily as needed for allergies or rhinitis.  gabapentin (NEURONTIN) 300 MG capsule Take 3 capsules (900 mg total) by mouth 2 (two) times daily.  glucose blood (TRUE METRIX BLOOD GLUCOSE TEST) test strip Use as instructed  . Insulin Detemir (LEVEMIR FLEXTOUCH) 100 UNIT/ML Pen Inject 53 Units into the skin 2 (two) times daily.  . Insulin Pen Needle (B-D UF III MINI PEN NEEDLES) 31G X 5 MM MISC Use as instructed. Inject into the skin three times daily  . isosorbide mononitrate (IMDUR) 60 MG 24 hr tablet Take 1 tablet (60 mg total) by mouth daily.  liraglutide (VICTOZA) 18 MG/3ML SOPN Inject 0.3 mLs (1.8 mg total) into the skin daily. (Patient taking differently: Inject 1.8 mg into the skin every evening. )  . metFORMIN (GLUCOPHAGE-XR) 500 MG 24 hr tablet Take 2 tablets (1,000 mg total) by mouth daily with breakfast. Take 2 tablets twice a day. (Patient taking differently: Take 1,000 mg by mouth daily with breakfast. )  . nitroGLYCERIN (NITROSTAT) 0.4 MG SL tablet Place 1 tablet (0.4 mg total) under the tongue every 5 (five) minutes as needed for chest pain.  ondansetron (ZOFRAN) 4 MG tablet Take 1 tablet (4 mg total) by mouth every 8 (eight) hours as needed for nausea or vomiting. (Patient not taking:  Reported on 10/02/2019)  . TRUEPLUS SAFETY LANCETS 28G MISC Use as directed  . [DISCONTINUED] diclofenac sodium (VOLTAREN) 1 % GEL Apply 2 g topically 4 (four) times daily.     Allergies:   Codeine and Sulfa antibiotics   Social History   Socioeconomic History  . Marital status: Widowed    Spouse name: Not on file  . Number of children: 1  . Years of education: Not on file  . Highest education level: Not on file  Occupational History  . Occupation: none  Tobacco Use  . Smoking status: Former Smoker    Packs/day: 0.00    Years: 24.00    Pack years: 0.00    Types: E-cigarettes  . Smokeless tobacco: Current User  . Tobacco comment: vape  Substance and Sexual Activity  . Alcohol  use: Yes    Comment: rare  . Drug use: No  . Sexual activity: Yes    Birth control/protection: None  Other Topics Concern  . Not on file  Social History Narrative  . Not on file   Social Determinants of Health   Financial Resource Strain:   . Difficulty of Paying Living Expenses: Not on file  Food Insecurity:   . Worried About Programme researcher, broadcasting/film/video in the Last Year: Not on file  . Ran Out of Food in the Last Year: Not on file  Transportation Needs:   . Lack of Transportation (Medical): Not on file  . Lack of Transportation (Non-Medical): Not on file  Physical Activity:   . Days of Exercise per Week: Not on file  . Minutes of Exercise per Session: Not on file  Stress:   . Feeling of Stress : Not on file  Social Connections:   . Frequency of Communication with Friends and Family: Not on file  . Frequency of Social Gatherings with Friends and Family: Not on file  . Attends Religious Services: Not on file  . Active Member of Clubs or Organizations: Not on file  . Attends Banker Meetings: Not on file  . Marital Status: Not on file     Family History: The patient's family history includes Aneurysm in her mother; Breast cancer in her paternal grandmother; Heart attack in her father;  Stroke in her father.  ROS:   Please see the history of present illness.     All other systems reviewed and are negative.  EKGs/Labs/Other Studies Reviewed:    The following studies were reviewed today:   EKG:  EKG is ordered today.  The ekg ordered today demonstrates normal sinus rhythm, rate 85, no ST/T abnormality  TTE 06/14/19:  1. Left ventricular ejection fraction, by visual estimation, is 60 to 65%. The left ventricle has normal function. Normal left ventricular size. There is no left ventricular hypertrophy.  2. Left ventricular diastolic Doppler parameters are consistent with impaired relaxation pattern of LV diastolic filling.  3. Global right ventricle has normal systolic function.The right ventricular size is normal. No increase in right ventricular wall thickness.  4. Left atrial size was normal.  5. Right atrial size was normal.  6. The mitral valve is normal in structure. No evidence of mitral valve regurgitation. No evidence of mitral stenosis.  7. The tricuspid valve is normal in structure. Tricuspid valve regurgitation was not visualized by color flow Doppler.  8. The aortic valve is normal in structure. Aortic valve regurgitation was not visualized by color flow Doppler. Structurally normal aortic valve, with no evidence of sclerosis or stenosis.  9. The pulmonic valve was normal in structure. Pulmonic valve regurgitation is not visualized by color flow Doppler. 10. The inferior vena cava is normal in size with greater than 50% respiratory variability, suggesting right atrial pressure of 3 mmHg.  Coronary CT 06/15/19: 1. Coronary calcium score of 81. This was 3 percentile for age and sex matched control. 2. 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 Noncalcified plaque in mid LCX appears to cause severe stenosis (70-99%) but vessel is small (<16mm) 3. Calcified plaque in the proximal LAD causes 25-49%  stenosis  CTFFR 06/15/19: 1. CT-FFR across lesion in proximal LAD is 0.92, suggesting lesion is not functionally significant 2. CT-FFR across lesion in ostial D2 is 0.62, suggesting functional significance 3. CT-FFR across lesion in anomalous circumflex is  0.65, suggesting functional significance  Recent Labs: 06/05/2019: ALT 17 06/13/2019: BUN 18; Potassium 4.2; Sodium 135 06/14/2019: Creatinine, Ser 0.97; Hemoglobin 14.0; Platelets 230  Recent Lipid Panel    Component Value Date/Time   CHOL 307 (H) 06/14/2019 0009   CHOL 283 (H) 06/05/2019 1039   TRIG 237 (H) 06/14/2019 0009   HDL 56 06/14/2019 0009   HDL 60 06/05/2019 1039   CHOLHDL 5.5 06/14/2019 0009   VLDL 47 (H) 06/14/2019 0009   LDLCALC 204 (H) 06/14/2019 0009   LDLCALC 165 (H) 06/05/2019 1039    Physical Exam:    VS:  BP (!) 86/63   Pulse 85   Temp (!) 94.3 F (34.6 C)   Ht 5\' 9"  (1.753 m)   Wt 232 lb (105.2 kg)   BMI 34.26 kg/m     Wt Readings from Last 3 Encounters:  10/02/19 232 lb (105.2 kg)  07/18/19 239 lb 3.2 oz (108.5 kg)  06/29/19 235 lb 6.4 oz (106.8 kg)  Recheck BP 100/70  GEN:  Well nourished, well developed in no acute distress HEENT: Normal NECK: No JVD; No carotid bruits LYMPHATICS: No lymphadenopathy CARDIAC: RRR, no murmurs, rubs, gallops RESPIRATORY:  Clear to auscultation without rales, wheezing or rhonchi  ABDOMEN: Soft, non-tender, non-distended MUSCULOSKELETAL:  No edema; No deformity  SKIN: Warm and dry NEUROLOGIC:  Alert and oriented x 3 PSYCHIATRIC:  Normal affect   ASSESSMENT:    1. Coronary artery disease of native artery of native heart with stable angina pectoris ([place])   2. Hyperlipidemia, unspecified hyperlipidemia type   3. Pre-procedural laboratory examination   4. Tobacco use    PLAN:    Coronary artery disease: Coronary CTA indicates severe obstructive disease in anomalous circumflex and second diagonal branch.  These are both small vessels and trial of  medical management was recommended.  Normal LV systolic function.   - Given that she continues to have significant chest pain despite maximally tolerated antianginal regimen (low BP limits further titration), recommend cath for further evaluation to determine if any lesions amenable to intervention.  Risks and benefits of cardiac catheterization have been discussed with the patient.  These include bleeding, infection, kidney damage, stroke, heart attack, death.  The patient understands these risks and is willing to proceed. -Continue aspirin 81 mg daily  -Continue atorvastatin 80 mg daily -Continue metoprolol 25 mg twice daily -Continue  Imdur 60 mg daily. -Continue as needed nitroglycerin   Hyperlipidemia: LDL 204 on 06/14/19.  Started on atorvastatin 80 mg at that time.  Will recheck lipid panel  Tobacco use: smoked 2ppd x 30 years, has quit smoking but now vaping.  Discussed risks of vaping and cessation strongly recommended  Type 2 diabetes: A1c 12.2 on 06/05/19. On insulin, victoza, metformin  RTC in 1 month   Medication Adjustments/Labs and Tests Ordered: Current medicines are reviewed at length with the patient today.  Concerns regarding medicines are outlined above.  Orders Placed This Encounter  Procedures  . Basic metabolic panel  . CBC  . EKG 12-Lead   No orders of the defined types were placed in this encounter.   Patient Instructions  Medication Instructions:  Your physician recommends that you continue on your current medications as directed. Please refer to the Current Medication list given to you today.  *If you need a refill on your cardiac medications before your next appointment, please call your pharmacy*  Lab Work: TODAY (BMET, CBC, Lipid) If you have labs (blood work) drawn today and  your tests are completely normal, you will receive your results only by: Marland Kitchen MyChart Message (if you have MyChart) OR . A paper copy in the mail If you have any lab test that is  abnormal or we need to change your treatment, we will call you to review the results.  Testing/Procedures: Your physician has requested that you have a cardiac catheterization. Cardiac catheterization is used to diagnose and/or treat various heart conditions. Doctors may recommend this procedure for a number of different reasons. The most common reason is to evaluate chest pain. Chest pain can be a symptom of coronary artery disease (CAD), and cardiac catheterization can show whether plaque is narrowing or blocking your heart's arteries. This procedure is also used to evaluate the valves, as well as measure the blood flow and oxygen levels in different parts of your heart. For further information please visit [email or web address] Please follow instruction sheet, as given.  Follow-Up: At [facility], you and your health needs are our priority.  As part of our continuing mission to provide you with exceptional heart care, we have created designated Provider Care Teams.  These Care Teams include your primary Cardiologist (physician) and Advanced Practice Providers (APPs -  Physician Assistants and Nurse Practitioners) who all work together to provide you with the care you need, when you need it.  Your next appointment:   1 month(s)  The format for your next appointment:   In Person  Provider:   Epifanio Lesches, MD  Other Instructions     [facility] CARDIOVASCULAR DIVISION [facility] [address] [address] [place] [address] Dept: [phone] Loc: [phone]  [name]  10/02/2019  You are scheduled for a Cardiac Catheterization on Friday, February 5 with Dr. Peter Swaziland.  1. Please arrive at the [facility] (Main Entrance A) at [facility]: [address] [place], [place] [address] at 5:30 AM (This time is two hours before your procedure to ensure your preparation). Free valet parking service is available.    Special note: Every effort is made to have your procedure done on time. Please understand that emergencies sometimes delay scheduled procedures.  2. Diet: Do not eat solid foods after midnight.  The patient may have clear liquids until 5am upon the day of the procedure.  3. Labs: You will need to have blood drawn on Monday, February 1 at [facility] [address], [place]  Open: 8am - 5pm (Lunch 12:30 - 1:30)   Phone: [phone]. You do not need to be fasting.  Covid test to be scanned into chart-another test not needed.   4. Medication instructions in preparation for your procedure:   Contrast Allergy: No  Take 1/2 dose of Insulin the night before-No insulin the day of.  Do not take Diabetes Med Glucophage (Metformin) on the day of the procedure and HOLD 48 HOURS AFTER THE PROCEDURE.  On the morning of your procedure, take your Aspirin and any morning medicines NOT listed above.  You may use sips of water.  5. Plan for one night stay--bring personal belongings. 6. Bring a current list of your medications and current insurance cards. 7. You MUST have a responsible person to drive you home. 8. Someone MUST be with you the first 24 hours after you arrive home or your discharge will be delayed. 9. Please wear clothes that are easy to get on and off and wear slip-on shoes.  Thank you for allowing [place] to care for  you!   -- [place] [facility]      Signed, Little Ishikawa, MD  10/02/2019 11:12 PM    [facility]

## 2019-10-02 ENCOUNTER — Encounter: Payer: Self-pay | Admitting: Cardiology

## 2019-10-02 ENCOUNTER — Other Ambulatory Visit (HOSPITAL_COMMUNITY): Payer: 59

## 2019-10-02 ENCOUNTER — Other Ambulatory Visit: Payer: Self-pay

## 2019-10-02 ENCOUNTER — Ambulatory Visit (INDEPENDENT_AMBULATORY_CARE_PROVIDER_SITE_OTHER): Payer: 59 | Admitting: Cardiology

## 2019-10-02 VITALS — BP 86/63 | HR 85 | Temp 94.3°F | Ht 69.0 in | Wt 232.0 lb

## 2019-10-02 DIAGNOSIS — Z01812 Encounter for preprocedural laboratory examination: Secondary | ICD-10-CM

## 2019-10-02 DIAGNOSIS — E785 Hyperlipidemia, unspecified: Secondary | ICD-10-CM

## 2019-10-02 DIAGNOSIS — I25118 Atherosclerotic heart disease of native coronary artery with other forms of angina pectoris: Secondary | ICD-10-CM

## 2019-10-02 DIAGNOSIS — Z720 Tobacco use: Secondary | ICD-10-CM | POA: Diagnosis not present

## 2019-10-02 NOTE — Patient Instructions (Addendum)
Medication Instructions:  Your physician recommends that you continue on your current medications as directed. Please refer to the Current Medication list given to you today.  *If you need a refill on your cardiac medications before your next appointment, please call your pharmacy*  Lab Work: TODAY (BMET, CBC, Lipid) If you have labs (blood work) drawn today and your tests are completely normal, you will receive your results only by: Marland Kitchen MyChart Message (if you have MyChart) OR . A paper copy in the mail If you have any lab test that is abnormal or we need to change your treatment, we will call you to review the results.  Testing/Procedures: Your physician has requested that you have a cardiac catheterization. Cardiac catheterization is used to diagnose and/or treat various heart conditions. Doctors may recommend this procedure for a number of different reasons. The most common reason is to evaluate chest pain. Chest pain can be a symptom of coronary artery disease (CAD), and cardiac catheterization can show whether plaque is narrowing or blocking your heart's arteries. This procedure is also used to evaluate the valves, as well as measure the blood flow and oxygen levels in different parts of your heart. For further information please visit [email or web address] Please follow instruction sheet, as given.  Follow-Up: At [facility], you and your health needs are our priority.  As part of our continuing mission to provide you with exceptional heart care, we have created designated Provider Care Teams.  These Care Teams include your primary Cardiologist (physician) and Advanced Practice Providers (APPs -  Physician Assistants and Nurse Practitioners) who all work together to provide you with the care you need, when you need it.  Your next appointment:   1 month(s)  The format for your next appointment:   In Person  Provider:   Epifanio Lesches, MD  Other Instructions     [facility]  MEDICAL GROUP [facility] CARDIOVASCULAR DIVISION [facility] [address] [place] [address] [place] [address] Dept: [phone] Loc: [phone]  LORRIANN HANSMANN  10/02/2019  You are scheduled for a Cardiac Catheterization on Friday, February 5 with Dr. Peter Swaziland.  1. Please arrive at the [facility] (Main Entrance A) at [facility]: [address] [place], [place] [address] at 5:30 AM (This time is two hours before your procedure to ensure your preparation). Free valet parking service is available.   Special note: Every effort is made to have your procedure done on time. Please understand that emergencies sometimes delay scheduled procedures.  2. Diet: Do not eat solid foods after midnight.  The patient may have clear liquids until 5am upon the day of the procedure.  3. Labs: You will need to have blood drawn on Monday, February 1 at [facility] [address], [place]  Open: 8am - 5pm (Lunch 12:30 - 1:30)   Phone: [phone]. You do not need to be fasting.  Covid test to be scanned into chart-another test not needed.   4. Medication instructions in preparation for your procedure:   Contrast Allergy: No  Take 1/2 dose of Insulin the night before-No insulin the day of.  Do not take Diabetes Med Glucophage (Metformin) on the day of the procedure and HOLD 48 HOURS AFTER THE PROCEDURE.  On the morning of your procedure, take your Aspirin and any morning medicines NOT listed above.  You may use sips of water.  5. Plan for one night stay--bring personal belongings. 6. Bring a current list of your medications and  current insurance cards. 7. You MUST have a responsible person to drive you home. 8. Someone MUST be with you the first 24 hours after you arrive home or your discharge will be delayed. 9. Please wear clothes that are easy to get on and off and wear slip-on shoes.  Thank you for allowing [place] to care for you!   -- [place] [facility]

## 2019-10-03 LAB — BASIC METABOLIC PANEL
BUN/Creatinine Ratio: 18 (ref 9–23)
BUN: 18 mg/dL (ref 6–24)
CO2: 28 mmol/L (ref 20–29)
Calcium: 9.4 mg/dL (ref 8.7–10.2)
Chloride: 99 mmol/L (ref 96–106)
Creatinine, Ser: 1.01 mg/dL — ABNORMAL HIGH (ref 0.57–1.00)
GFR calc Af Amer: 75 mL/min/{1.73_m2} (ref >59)
GFR calc non Af Amer: 65 mL/min/{1.73_m2} (ref >59)
Glucose: 235 mg/dL — ABNORMAL HIGH (ref 65–99)
Potassium: 4.9 mmol/L (ref 3.5–5.2)
Sodium: 140 mmol/L (ref 134–144)

## 2019-10-03 LAB — LIPID PANEL
Chol/HDL Ratio: 2.8 ratio (ref 0.0–4.4)
Cholesterol, Total: 164 mg/dL (ref 100–199)
HDL: 58 mg/dL (ref >39)
LDL Chol Calc (NIH): 78 mg/dL (ref 0–99)
Triglycerides: 164 mg/dL — ABNORMAL HIGH (ref 0–149)
VLDL Cholesterol Cal: 28 mg/dL (ref 5–40)

## 2019-10-04 ENCOUNTER — Other Ambulatory Visit: Payer: Self-pay

## 2019-10-04 MED ORDER — EZETIMIBE 10 MG PO TABS: 10.0000 mg | ORAL_TABLET | Freq: Every day | ORAL | 3 refills | Status: DC

## 2019-10-04 MED FILL — EZETIMIBE 10 MG TAB: 10 | 30 days supply | Qty: 30 | Fill #0

## 2019-10-05 ENCOUNTER — Other Ambulatory Visit: Payer: Self-pay | Admitting: *Deleted

## 2019-10-05 ENCOUNTER — Telehealth: Payer: Self-pay | Admitting: *Deleted

## 2019-10-05 DIAGNOSIS — I25118 Atherosclerotic heart disease of native coronary artery with other forms of angina pectoris: Secondary | ICD-10-CM

## 2019-10-05 DIAGNOSIS — Z01812 Encounter for preprocedural laboratory examination: Secondary | ICD-10-CM

## 2019-10-05 LAB — CBC
Hematocrit: 39.7 % (ref 34.0–46.6)
Hemoglobin: 12.9 g/dL (ref 11.1–15.9)
MCH: 27.7 pg (ref 26.6–33.0)
MCHC: 32.5 g/dL (ref 31.5–35.7)
MCV: 85 fL (ref 79–97)
Platelets: 203 10*3/uL (ref 150–450)
RBC: 4.65 x10E6/uL (ref 3.77–5.28)
RDW: 13.5 % (ref 11.7–15.4)
WBC: 6.1 10*3/uL (ref 3.4–10.8)

## 2019-10-05 NOTE — Telephone Encounter (Signed)
Pt contacted pre-catheterization scheduled at [facility] for: Friday October 06, 2019  7:30 AM Verified arrival time and place: [facility] Main Entrance A [facility]) at: 5:30 AM   No solid food after midnight prior to cath, clear liquids until 5 AM day of procedure. Contrast allergy: no  Hold: Metformin-day of procedure and 48 hours post procedure. Insulin-AM of procedure. Insulin-1/2 usual PM dose Victoza-AM of procedure.  Except hold medications AM meds can be  taken pre-cath with sip of water including: ASA 81 mg   Confirmed patient has responsible adult to drive home post procedure and observe 24 hours after arriving home: yes  Currently, due to Covid-19 pandemic, only one person will be allowed with patient. Must be the same person for patient's entire stay and will be required to wear a mask. They will be asked to wait in the waiting room for the duration of the patient's stay.  Patients are required to wear a mask when they enter the hospital.      COVID-19 Pre-Screening Questions:  . In the past 7 to 10 days have you had a cough,  shortness of breath, headache, congestion, fever (100 or greater) body aches, chills, sore throat, or sudden loss of taste or sense of smell? no . Have you been around anyone with known Covid 19 in the past 7-10 days? no . Have you been around anyone who is awaiting Covid 19 test results in the past 7 to 10 days? no . Have you been around anyone who has been exposed to Covid 19, or has mentioned symptoms of Covid 19 within the past 7 to 10 days? no  I reviewed mask/procedure/visitor instructions, Covid-19 screening questions with patient, she verbalized understanding, thanked me for call.  09/05/19 positive Covid-19, results scanned under lab.

## 2019-10-05 NOTE — Addendum Note (Signed)
Addended by: Jacqlyn Krauss on: 10/05/2019 10:58 AM   Modules accepted: Orders

## 2019-10-06 ENCOUNTER — Encounter: Payer: Self-pay | Admitting: *Deleted

## 2019-10-06 ENCOUNTER — Encounter (HOSPITAL_COMMUNITY)
Admission: RE | Disposition: A | Discharge status: Home / Self Care | Payer: Self-pay | Source: Ambulatory Visit | Attending: Cardiology

## 2019-10-06 ENCOUNTER — Ambulatory Visit (HOSPITAL_COMMUNITY)
Admission: RE | Admit: 2019-10-06 | Discharge: 2019-10-06 | Disposition: A | Discharge status: Home / Self Care | Payer: 59 | Source: Ambulatory Visit | Attending: Cardiology | Admitting: Cardiology

## 2019-10-06 ENCOUNTER — Other Ambulatory Visit: Payer: Self-pay

## 2019-10-06 DIAGNOSIS — Z87891 Personal history of nicotine dependence: Secondary | ICD-10-CM | POA: Diagnosis not present

## 2019-10-06 DIAGNOSIS — K219 Gastro-esophageal reflux disease without esophagitis: Secondary | ICD-10-CM | POA: Insufficient documentation

## 2019-10-06 DIAGNOSIS — Z7982 Long term (current) use of aspirin: Secondary | ICD-10-CM | POA: Insufficient documentation

## 2019-10-06 DIAGNOSIS — E7800 Pure hypercholesterolemia, unspecified: Secondary | ICD-10-CM | POA: Insufficient documentation

## 2019-10-06 DIAGNOSIS — IMO0002 Reserved for concepts with insufficient information to code with codable children: Secondary | ICD-10-CM

## 2019-10-06 DIAGNOSIS — Z79899 Other long term (current) drug therapy: Secondary | ICD-10-CM | POA: Diagnosis not present

## 2019-10-06 DIAGNOSIS — Z885 Allergy status to narcotic agent status: Secondary | ICD-10-CM | POA: Insufficient documentation

## 2019-10-06 DIAGNOSIS — I25119 Atherosclerotic heart disease of native coronary artery with unspecified angina pectoris: Secondary | ICD-10-CM | POA: Diagnosis not present

## 2019-10-06 DIAGNOSIS — G4730 Sleep apnea, unspecified: Secondary | ICD-10-CM | POA: Diagnosis not present

## 2019-10-06 DIAGNOSIS — E1140 Type 2 diabetes mellitus with diabetic neuropathy, unspecified: Secondary | ICD-10-CM

## 2019-10-06 DIAGNOSIS — M19042 Primary osteoarthritis, left hand: Secondary | ICD-10-CM | POA: Diagnosis not present

## 2019-10-06 DIAGNOSIS — I10 Essential (primary) hypertension: Secondary | ICD-10-CM | POA: Diagnosis not present

## 2019-10-06 DIAGNOSIS — Q250 Patent ductus arteriosus: Secondary | ICD-10-CM | POA: Diagnosis not present

## 2019-10-06 DIAGNOSIS — Z882 Allergy status to sulfonamides status: Secondary | ICD-10-CM | POA: Diagnosis not present

## 2019-10-06 DIAGNOSIS — E785 Hyperlipidemia, unspecified: Secondary | ICD-10-CM | POA: Diagnosis not present

## 2019-10-06 DIAGNOSIS — M19041 Primary osteoarthritis, right hand: Secondary | ICD-10-CM | POA: Diagnosis not present

## 2019-10-06 DIAGNOSIS — G4733 Obstructive sleep apnea (adult) (pediatric): Secondary | ICD-10-CM | POA: Diagnosis present

## 2019-10-06 DIAGNOSIS — F17200 Nicotine dependence, unspecified, uncomplicated: Secondary | ICD-10-CM | POA: Diagnosis present

## 2019-10-06 DIAGNOSIS — E1165 Type 2 diabetes mellitus with hyperglycemia: Secondary | ICD-10-CM

## 2019-10-06 DIAGNOSIS — I209 Angina pectoris, unspecified: Secondary | ICD-10-CM

## 2019-10-06 DIAGNOSIS — Z794 Long term (current) use of insulin: Secondary | ICD-10-CM | POA: Diagnosis not present

## 2019-10-06 DIAGNOSIS — E119 Type 2 diabetes mellitus without complications: Secondary | ICD-10-CM | POA: Diagnosis not present

## 2019-10-06 DIAGNOSIS — Z006 Encounter for examination for normal comparison and control in clinical research program: Secondary | ICD-10-CM

## 2019-10-06 DIAGNOSIS — Z8249 Family history of ischemic heart disease and other diseases of the circulatory system: Secondary | ICD-10-CM | POA: Insufficient documentation

## 2019-10-06 HISTORY — PX: LEFT HEART CATH AND CORONARY ANGIOGRAPHY: CATH118249

## 2019-10-06 HISTORY — DX: Angina pectoris, unspecified: I20.9

## 2019-10-06 LAB — GLUCOSE, CAPILLARY
Glucose-Capillary: 307 mg/dL — ABNORMAL HIGH (ref 70–99)
Glucose-Capillary: 269 mg/dL — ABNORMAL HIGH (ref 70–99)

## 2019-10-06 SURGERY — LEFT HEART CATH AND CORONARY ANGIOGRAPHY
Anesthesia: LOCAL

## 2019-10-06 MED ORDER — SODIUM CHLORIDE 0.9% FLUSH: 3.0000 mL | INTRAVENOUS | Status: DC | PRN

## 2019-10-06 MED ORDER — SODIUM CHLORIDE 0.9% FLUSH: 3.0000 mL | Freq: Two times a day (BID) | INTRAVENOUS | Status: DC

## 2019-10-06 MED ORDER — MIDAZOLAM HCL 2 MG/2ML IJ SOLN
INTRAMUSCULAR | Status: AC
  Filled 2019-10-06: qty 2

## 2019-10-06 MED ORDER — LIDOCAINE HCL (PF) 1 % IJ SOLN
INTRAMUSCULAR | Status: AC
  Filled 2019-10-06: qty 30

## 2019-10-06 MED ORDER — MIDAZOLAM HCL 2 MG/2ML IJ SOLN
INTRAMUSCULAR | Status: DC | PRN
  Administered 2019-10-06: 1 mg via INTRAVENOUS

## 2019-10-06 MED ORDER — IOHEXOL 350 MG/ML SOLN
INTRAVENOUS | Status: DC | PRN
  Administered 2019-10-06: 120 mL via INTRA_ARTERIAL

## 2019-10-06 MED ORDER — LIDOCAINE HCL (PF) 1 % IJ SOLN
INTRAMUSCULAR | Status: DC | PRN
  Administered 2019-10-06: 2 mL

## 2019-10-06 MED ORDER — HEPARIN (PORCINE) IN NACL 1000-0.9 UT/500ML-% IV SOLN
INTRAVENOUS | Status: DC | PRN
  Administered 2019-10-06 (×2): 500 mL

## 2019-10-06 MED ORDER — SODIUM CHLORIDE 0.9 % IV SOLN: 250.0000 mL | INTRAVENOUS | Status: DC | PRN

## 2019-10-06 MED ORDER — HEPARIN SODIUM (PORCINE) 1000 UNIT/ML IJ SOLN
INTRAMUSCULAR | Status: AC
  Filled 2019-10-06: qty 1

## 2019-10-06 MED ORDER — SODIUM CHLORIDE 0.9 % WEIGHT BASED INFUSION: 1.0000 mL/kg/h | INTRAVENOUS | Status: DC

## 2019-10-06 MED ORDER — HEPARIN SODIUM (PORCINE) 1000 UNIT/ML IJ SOLN
INTRAMUSCULAR | Status: DC | PRN
  Administered 2019-10-06: 5000 [IU] via INTRAVENOUS

## 2019-10-06 MED ORDER — METFORMIN HCL ER 500 MG PO TB24: 1000.0000 mg | ORAL_TABLET | Freq: Every day | ORAL | Status: DC

## 2019-10-06 MED ORDER — VERAPAMIL HCL 2.5 MG/ML IV SOLN
INTRAVENOUS | Status: DC | PRN
  Administered 2019-10-06: 10 mL via INTRA_ARTERIAL

## 2019-10-06 MED ORDER — FENTANYL CITRATE (PF) 100 MCG/2ML IJ SOLN
INTRAMUSCULAR | Status: AC
  Filled 2019-10-06: qty 2

## 2019-10-06 MED ORDER — ONDANSETRON HCL 4 MG/2ML IJ SOLN: 4.0000 mg | Freq: Four times a day (QID) | INTRAMUSCULAR | Status: DC | PRN

## 2019-10-06 MED ORDER — ACETAMINOPHEN 325 MG PO TABS: 650.0000 mg | ORAL_TABLET | ORAL | Status: DC | PRN

## 2019-10-06 MED ORDER — FENTANYL CITRATE (PF) 100 MCG/2ML IJ SOLN
INTRAMUSCULAR | Status: DC | PRN
  Administered 2019-10-06: 25 ug via INTRAVENOUS

## 2019-10-06 MED ORDER — HEPARIN (PORCINE) IN NACL 1000-0.9 UT/500ML-% IV SOLN
INTRAVENOUS | Status: AC
  Filled 2019-10-06: qty 1000

## 2019-10-06 MED ORDER — VERAPAMIL HCL 2.5 MG/ML IV SOLN
INTRAVENOUS | Status: AC
  Filled 2019-10-06: qty 2

## 2019-10-06 MED ORDER — SODIUM CHLORIDE 0.9 % WEIGHT BASED INFUSION
3.0000 mL/kg/h | INTRAVENOUS | Status: AC
  Administered 2019-10-06: 3 mL/kg/h via INTRAVENOUS

## 2019-10-06 SURGICAL SUPPLY — 14 items
CATH 5FR JL3.5 JR4 ANG PIG MP (CATHETERS) ×1 IMPLANT
CATH EXPO 5F MPA-1 (CATHETERS) ×1 IMPLANT
CATH INFINITI 5 FR RCB (CATHETERS) ×1 IMPLANT
CATH LAUNCHER 5F RADR (CATHETERS) IMPLANT
CATHETER LAUNCHER 5F RADR (CATHETERS) ×2
DEVICE RAD COMP TR BAND LRG (VASCULAR PRODUCTS) ×1 IMPLANT
GLIDESHEATH SLEND SS 6F .021 (SHEATH) ×1 IMPLANT
GUIDEWIRE INQWIRE 1.5J.035X260 (WIRE) IMPLANT
INQWIRE 1.5J .035X260CM (WIRE) ×2
KIT HEART LEFT (KITS) ×2 IMPLANT
PACK CARDIAC CATHETERIZATION (CUSTOM PROCEDURE TRAY) ×2 IMPLANT
SHEATH PROBE COVER 6X72 (BAG) ×1 IMPLANT
TRANSDUCER W/STOPCOCK (MISCELLANEOUS) ×2 IMPLANT
TUBING CIL FLEX 10 FLL-RA (TUBING) ×2 IMPLANT

## 2019-10-06 NOTE — Discharge Instructions (Signed)
Radial Site Care  This sheet gives you information about how to care for yourself after your procedure. Your health care provider may also give you more specific instructions. If you have problems or questions, contact your health care provider. What can I expect after the procedure? After the procedure, it is common to have:  Bruising and tenderness at the catheter insertion area. Follow these instructions at home: Medicines  Take over-the-counter and prescription medicines only as told by your health care provider. Insertion site care  Follow instructions from your health care provider about how to take care of your insertion site. Make sure you: ? Wash your hands with soap and water before you change your bandage (dressing). If soap and water are not available, use hand sanitizer. ? Change your dressing as told by your health care provider. ? Leave stitches (sutures), skin glue, or adhesive strips in place. These skin closures may need to stay in place for 2 weeks or longer. If adhesive strip edges start to loosen and curl up, you may trim the loose edges. Do not remove adhesive strips completely unless your health care provider tells you to do that.  Check your insertion site every day for signs of infection. Check for: ? Redness, swelling, or pain. ? Fluid or blood. ? Pus or a bad smell. ? Warmth.  Do not take baths, swim, or use a hot tub until your health care provider approves.  You may shower 24-48 hours after the procedure, or as directed by your health care provider. ? Remove the dressing and gently wash the site with plain soap and water. ? Pat the area dry with a clean towel. ? Do not rub the site. That could cause bleeding.  Do not apply powder or lotion to the site. Activity   For 24 hours after the procedure, or as directed by your health care provider: ? Do not flex or bend the affected arm. ? Do not push or pull heavy objects with the affected arm. ? Do not  drive yourself home from the hospital or clinic. You may drive 24 hours after the procedure unless your health care provider tells you not to. ? Do not operate machinery or power tools.  Do not lift anything that is heavier than 10 lb (4.5 kg), or the limit that you are told, until your health care provider says that it is safe.  Ask your health care provider when it is okay to: ? Return to work or school. ? Resume usual physical activities or sports. ? Resume sexual activity. General instructions  If the catheter site starts to bleed, raise your arm and put firm pressure on the site. If the bleeding does not stop, get help right away. This is a medical emergency.  If you went home on the same day as your procedure, a responsible adult should be with you for the first 24 hours after you arrive home.  Keep all follow-up visits as told by your health care provider. This is important. Contact a health care provider if:  You have a fever.  You have redness, swelling, or yellow drainage around your insertion site. Get help right away if:  You have unusual pain at the radial site.  The catheter insertion area swells very fast.  The insertion area is bleeding, and the bleeding does not stop when you hold steady pressure on the area.  Your arm or hand becomes pale, cool, tingly, or numb. These symptoms may represent a serious problem   that is an emergency. Do not wait to see if the symptoms will go away. Get medical help right away. Call your local emergency services (911 in the [place] Do not drive yourself to the hospital. Summary  After the procedure, it is common to have bruising and tenderness at the site.  Follow instructions from your health care provider about how to take care of your radial site wound. Check the wound every day for signs of infection.  Do not lift anything that is heavier than 10 lb (4.5 kg), or the limit that you are told, until your health care provider says  that it is safe. This information is not intended to replace advice given to you by your health care provider. Make sure you discuss any questions you have with your health care provider. Document Revised: 09/22/2017 Document Reviewed: 09/22/2017 Elsevier Patient Education  2020 Elsevier Inc[employer]  

## 2019-10-06 NOTE — Research (Signed)
CADFEM Informed Consent                  Subject Name:   [name]   Subject met inclusion and exclusion criteria.  The informed consent form, study requirements and expectations were reviewed with the subject and questions and concerns were addressed prior to the signing of the consent form.  The subject verbalized understanding of the trial requirements.  The subject agreed to participate in the CADFEM trial and signed the informed consent.  The informed consent was obtained prior to performance of any protocol-specific procedures for the subject.  A copy of the signed informed consent was given to the subject and a copy was placed in the subject's medical record.   [place] Aryka Coonradt, Research Assistant  10/06/2019 06:53 a.m.

## 2019-10-06 NOTE — Interval H&P Note (Signed)
History and Physical Interval Note:  10/06/2019 7:19 AM  [name]  has presented today for surgery, with the diagnosis of chest pain - abnormal ct.  The various methods of treatment have been discussed with the patient and family. After consideration of risks, benefits and other options for treatment, the patient has consented to  Procedure(s): LEFT HEART CATH AND CORONARY ANGIOGRAPHY (N/A) as a surgical intervention.  The patient's history has been reviewed, patient examined, no change in status, stable for surgery.  I have reviewed the patient's chart and labs.  Questions were answered to the patient's satisfaction.   Cath Lab Visit (complete for each Cath Lab visit)  Clinical Evaluation Leading to the Procedure:   ACS: No.  Non-ACS:    Anginal Classification: CCS III  Anti-ischemic medical therapy: Maximal Therapy (2 or more classes of medications)  Non-Invasive Test Results: Intermediate-risk stress test findings: cardiac mortality 1-3%/year  Prior CABG: No previous CABG        Theron Arista [facility] 10/06/2019 7:19 AM

## 2019-10-09 ENCOUNTER — Telehealth: Payer: Self-pay | Admitting: Cardiology

## 2019-10-09 NOTE — Telephone Encounter (Signed)
Pt called to report she has a heart cath and was told by Dr. Swaziland that she needed "aggressive" medical treatment. She was sent home on Lipitor and Zetia and she takes an ASA 81mg ... her appt with Dr. is not until 10/30/19 and wants to know if he would like for her to be on anything else and would feel better knowing prior to waiting until her 10/2019 OV.

## 2019-10-09 NOTE — Telephone Encounter (Signed)
New message:    Patient calling stating that the doctor was going to start her on some medications. Please call patient back.

## 2019-10-10 MED FILL — NITROGLYCERIN 0.4 MG TAB SL: 0.4 | 25 days supply | Qty: 25 | Fill #1

## 2019-10-10 NOTE — Telephone Encounter (Signed)
Can we see her before then?  Could we add her on either Monday or Tuesday next week?

## 2019-10-10 NOTE — Telephone Encounter (Signed)
Spoke to [name]-she continues to have daily chest pain (radiating into arm and up neck).  States she is taking NTG daily. Has taken 2 NTG today.   She states as long as she goes home and lays in bed and does nothing, the pain subsides.   She states the pain is wearing on her and is causing a lot of fatigue.     She states nothing has worsened since cath.   Rescheduled her to see Dr. Bjorn Pippin Monday 2/15 at 11AM.   Advised would make Dr. Bjorn Pippin aware and call if there are further recommendations.

## 2019-10-15 NOTE — Progress Notes (Signed)
Cardiology Office Note:    Date:  10/16/2019   ID:  [name], DOB [birth year], MRN [id number]  PCP:  Marcine Matar, MD  Cardiologist:  No primary care provider on file.  Electrophysiologist:  None   Referring MD: Marcine Matar, MD   Chief Complaint  Patient presents with  . Chest Pain   History of Present Illness:    [name] is a 49 y.o. female with a hx of coronary artery disease, type 2 diabetes, hypertension, hyperlipidemia who presents for a follow-up evaluation.  Patient was admitted to [facility] on 06/13/19 with chest pain.  EKG was unremarkable and high-sensitivity troponins were negative.  However due to her extensive risk factors including uncontrolled diabetes, coronary CTA was ordered.  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  She was discharged on medical management: atorvastatin 80 mg, Imdur 30 mg.  TTE showed EF 60-65%.  During subsequent office visits, her antianginal regimen was titrated as she continued to report chest pain.  At last clinic visit on 10/02/2019, she was on metoprolol 25 mg twice daily and Imdur 60 mg daily, but further titration was limited by low blood pressures.  Given inability to further titrate up antianginals and having persistent chest pain, cardiac catheterization was ordered.  Underwent cath on 10/06/2019, which showed three-vessel CAD: 80% mid LAD, 85% slitlike ostial D1, 80% ostial D2, 80% small PDA, anomalous circumflex off the RCA cusp with moderate diffuse disease.  Recommendation was for aggressive medical management and if symptoms persist consider surgical consultation.  Since her heart catheterization, she reports  that she continues to have chest pain.  Reports that it is occurring daily, typically uses 1 nitroglycerin per day.  Yesterday had to use 3 nitroglycerin for her chest pain.  Also reports that she has been having dizzy spells.  Denies any dizziness or lightheadedness with position change, but notes that certain head movements cause her to feel like the room is spinning.  Last week was walking, bent her head down to look at her phone and then felt dizzy and fell down 3 stairs, bruising her knee.    Past Medical History:  Diagnosis Date  . Arthritis    fingers  . Cervical dysplasia   . Depression   . Diabetes mellitus   . Dyspnea 11/22/2013  . GERD (gastroesophageal reflux disease)   . High cholesterol   . Hypertension    no meds now  . Kidney stones   . PONV (postoperative nausea and vomiting)   . Sleep apnea    lost 70lbs no cpap x27yrs now    Past Surgical History:  Procedure Laterality Date  . CARDIAC CATHETERIZATION     4-22yrs ago  . CERVICAL CONIZATION W/BX N/A 05/04/2018   Procedure: CONIZATION CERVIX WITH BIOPSY - COLD KNIFE;  Surgeon: Hermina Staggers, MD;  Location: [facility];  Service: Gynecology;  Laterality: N/A;  . CHOLECYSTECTOMY    . ENDOMETRIAL ABLATION     6 years ago  . IRRIGATION AND DEBRIDEMENT ABSCESS Right 03/17/2017   Procedure: IRRIGATION AND DEBRIDEMENT RIGHT THIGH ABSCESS;  Surgeon: Karie Soda, MD;  Location: [facility];  Service: General;  Laterality: Right;  . LAPAROSCOPIC  APPENDECTOMY  10/04/2011   Procedure: APPENDECTOMY LAPAROSCOPIC;  Surgeon: Rulon Abide, DO;  Location: [facility];  Service: General;  Laterality: N/A;  . LEFT HEART CATH AND CORONARY ANGIOGRAPHY N/A 10/06/2019   Procedure: LEFT HEART CATH AND CORONARY ANGIOGRAPHY;  Surgeon: Swaziland, Peter M, MD;  Location: [facility];  Service: Cardiovascular;  Laterality: N/A;    Current Medications: Current Meds  Medication Sig  . acetaminophen (TYLENOL) 500 MG tablet Take  1,000 mg by mouth every 6 (six) hours as needed for moderate pain or headache.  Marland Kitchen aspirin EC 81 MG tablet Take 1 tablet (81 mg total) by mouth daily.  Marland Kitchen atorvastatin (LIPITOR) 80 MG tablet Take 1 tablet (80 mg total) by mouth daily at 6 PM.  . esomeprazole (NEXIUM) 20 MG capsule Take 20 mg by mouth daily.   Marland Kitchen ezetimibe (ZETIA) 10 MG tablet Take 1 tablet (10 mg total) by mouth daily.  Marland Kitchen FLUoxetine (PROZAC) 40 MG capsule Take one tab daily with a 20 mg daily (Patient taking differently: Take 40 mg by mouth daily. (take with 20mg  to equal 60mg  total))  . fluticasone (FLONASE) 50 MCG/ACT nasal spray Place 2 sprays into both nostrils daily as needed for allergies or rhinitis.  Marland Kitchen gabapentin (NEURONTIN) 300 MG capsule Take 3 capsules (900 mg total) by mouth 2 (two) times daily.  Marland Kitchen glucose blood (TRUE METRIX BLOOD GLUCOSE TEST) test strip Use as instructed  . Insulin Detemir (LEVEMIR FLEXTOUCH) 100 UNIT/ML Pen Inject 53 Units into the skin 2 (two) times daily.  . Insulin Pen Needle (B-D UF III MINI PEN NEEDLES) 31G X 5 MM MISC Use as instructed. Inject into the skin three times daily  . isosorbide mononitrate (IMDUR) 60 MG 24 hr tablet Take 1 tablet (60 mg total) by mouth daily.  Marland Kitchen liraglutide (VICTOZA) 18 MG/3ML SOPN Inject 0.3 mLs (1.8 mg total) into the skin daily. (Patient taking differently: Inject 1.8 mg into the skin every evening. )  . Melatonin 3 MG CAPS Take 3 mg by mouth at bedtime as needed (sleep).  . metFORMIN (GLUCOPHAGE-XR) 500 MG 24 hr tablet Take 2 tablets (1,000 mg total) by mouth daily with breakfast.  . nitroGLYCERIN (NITROSTAT) 0.4 MG SL tablet Place 1 tablet (0.4 mg total) under the tongue every 5 (five) minutes as needed for chest pain.  . TRUEPLUS SAFETY LANCETS 28G MISC Use as directed  . [DISCONTINUED] FLUoxetine (PROZAC) 20 MG tablet 1 tab daily witht a 60 mg tab (Patient taking differently: Take 20 mg by mouth daily. (take with 40mg  to equal 60mg  total))     Allergies:    Codeine and Sulfa antibiotics   Social History   Socioeconomic History  . Marital status: Widowed    Spouse name: Not on file  . Number of children: 1  . Years of education: Not on file  . Highest education level: Not on file  Occupational History  . Occupation: none  Tobacco Use  . Smoking status: Former Smoker    Packs/day: 0.00    Years: 24.00    Pack years: 0.00    Types: E-cigarettes  . Smokeless tobacco: Current User  . Tobacco comment: vape  Substance and Sexual Activity  . Alcohol use: Yes    Comment: rare  . Drug use: No  . Sexual activity: Yes    Birth control/protection: None  Other Topics Concern  . Not on file  Social History Narrative  . Not on file   Social Determinants of  Health   Financial Resource Strain:   . Difficulty of Paying Living Expenses: Not on file  Food Insecurity:   . Worried About Programme researcher, broadcasting/film/video in the Last Year: Not on file  . Ran Out of Food in the Last Year: Not on file  Transportation Needs:   . Lack of Transportation (Medical): Not on file  . Lack of Transportation (Non-Medical): Not on file  Physical Activity:   . Days of Exercise per Week: Not on file  . Minutes of Exercise per Session: Not on file  Stress:   . Feeling of Stress : Not on file  Social Connections:   . Frequency of Communication with Friends and Family: Not on file  . Frequency of Social Gatherings with Friends and Family: Not on file  . Attends Religious Services: Not on file  . Active Member of Clubs or Organizations: Not on file  . Attends Banker Meetings: Not on file  . Marital Status: Not on file     Family History: The patient's family history includes Aneurysm in her mother; Breast cancer in her paternal grandmother; Heart attack in her father; Stroke in her father.  ROS:   Please see the history of present illness.     All other systems reviewed and are negative.  EKGs/Labs/Other Studies Reviewed:    The following studies  were reviewed today:   EKG:  EKG is ordered today.  The ekg ordered today demonstrates normal sinus rhythm, rate 67, no ST/T abnormality  TTE 06/14/19:  1. Left ventricular ejection fraction, by visual estimation, is 60 to 65%. The left ventricle has normal function. Normal left ventricular size. There is no left ventricular hypertrophy.  2. Left ventricular diastolic Doppler parameters are consistent with impaired relaxation pattern of LV diastolic filling.  3. Global right ventricle has normal systolic function.The right ventricular size is normal. No increase in right ventricular wall thickness.  4. Left atrial size was normal.  5. Right atrial size was normal.  6. The mitral valve is normal in structure. No evidence of mitral valve regurgitation. No evidence of mitral stenosis.  7. The tricuspid valve is normal in structure. Tricuspid valve regurgitation was not visualized by color flow Doppler.  8. The aortic valve is normal in structure. Aortic valve regurgitation was not visualized by color flow Doppler. Structurally normal aortic valve, with no evidence of sclerosis or stenosis.  9. The pulmonic valve was normal in structure. Pulmonic valve regurgitation is not visualized by color flow Doppler. 10. The inferior vena cava is normal in size with greater than 50% respiratory variability, suggesting right atrial pressure of 3 mmHg.  Coronary CT 06/15/19: 1. Coronary calcium score of 81. This was 93 percentile for age and sex matched control. 2. 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 Noncalcified plaque in mid LCX appears to cause severe stenosis (70-99%) but vessel is small (<69mm) 3. Calcified plaque in the proximal LAD causes 25-49% stenosis  CTFFR 06/15/19: 1. CT-FFR across lesion in proximal LAD is 0.92, suggesting lesion is not functionally significant 2. CT-FFR across lesion in ostial D2 is 0.62, suggesting  functional significance 3. CT-FFR across lesion in anomalous circumflex is 0.65, suggesting functional significance  Cath 10/06/19:  Prox Cx lesion is 60% stenosed.  Mid Cx lesion is 70% stenosed.  RPDA lesion is 80% stenosed.  Mid LM to Ost LAD lesion is 35% stenosed.  1st Diag lesion is 85% stenosed.  2nd  Diag lesion is 80% stenosed.  Mid LAD lesion is 80% stenosed.  LV end diastolic pressure is normal.   1. Three vessel CAD.    - 80% mid LAD after the second diagonal    - 85% slit like stenosis at the origin of the first diagonal- best seen in the LAO caudal shot.    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 Normal LVEDP  Plan: reviewed with interventional colleagues. Her anatomy doesn't offer good PCI options except the mid LAD. Treating the ostial disease in the diagonal branches would likely require stenting into the LAD. The LCx and PDA are too small for PCI. I would aggressively treat medically. If symptoms persist I would consider surgical consultation.     Recent Labs: 06/05/2019: ALT 17 10/02/2019: BUN 18; Creatinine, Ser 1.01; Potassium 4.9; Sodium 140 10/05/2019: Hemoglobin 12.9; Platelets 203  Recent Lipid Panel    Component Value Date/Time   CHOL 164 10/02/2019 0947   TRIG 164 (H) 10/02/2019 0947   HDL 58 10/02/2019 0947   CHOLHDL 2.8 10/02/2019 0947   CHOLHDL 5.5 06/14/2019 0009   VLDL 47 (H) 06/14/2019 0009   LDLCALC 78 10/02/2019 0947    Physical Exam:    VS:  Ht 5\' 8"  (1.727 m)   Wt 232 lb 9.6 oz (105.5 kg)   LMP 10/03/2011   SpO2 99%   BMI 35.37 kg/m     Wt Readings from Last 3 Encounters:  10/16/19 232 lb 9.6 oz (105.5 kg)  10/06/19 232 lb (105.2 kg)  10/02/19 232 lb (105.2 kg)  Orthostatics: Lying 114/77 75 Sitting 112/78 64 Standing 99/68 67  GEN:  in no acute distress HEENT: Normal NECK: No JVD LYMPHATICS: No lymphadenopathy CARDIAC: RRR, no murmurs,  rubs, gallops RESPIRATORY:  Clear to auscultation without rales, wheezing or rhonchi  ABDOMEN: Soft, non-tender, non-distended MUSCULOSKELETAL:  No edema; No deformity  SKIN: Warm and dry NEUROLOGIC:  Alert and oriented x 3 PSYCHIATRIC:  Normal affect   ASSESSMENT:    1. Coronary artery disease of native artery of native heart with stable angina pectoris (HCC)   2. Uncontrolled type 2 diabetes mellitus with diabetic neuropathy, with long-term current use of insulin (HCC)   3. Lightheadedness   4. Hyperlipidemia, unspecified hyperlipidemia type   5. Tobacco use   6. 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  Recommendation was for aggressive medical management and if symptoms persist consider surgical consultation..  Normal LV systolic function.   -Continue aspirin 81 mg daily  -Continue atorvastatin 80 mg daily.  LDL 78 on 10/02/19, added zetia 10 mg daily -Continue metoprolol 25 mg twice daily -Continue  Imdur 60 mg daily -Soft blood pressures limiting further titration of metoprolol and Imdur.  Will add ranexa 500 mg twice daily -Continue as needed nitroglycerin  -Given persistent chest pain, discussed options with patient, including continuing medical management versus surgical consultation for revascularization.  Her preference is to discuss revascularization with cardiac surgery.  Will refer to cardiac surgery for evaluation for CABG  Lightheadedness: Description suggests BPPV, as describes feeling like room is spinning with certain head movements.  Mild  drop in blood pressure which standing today, but not meeting orthostatic hypotension criteria and asymptomatic  Hyperlipidemia: LDL 204 on 06/14/19.  Started on atorvastatin 80 mg at  that time.  LDL 78 on 10/02/19, added zetia 10 mg daily  Tobacco use: smoked 2ppd x 30 years, has quit smoking but now vaping.  Have discussed risks of vaping with vaping and cessation strongly recommended  Type 2 diabetes: A1c 12.2 on 06/05/19. On insulin, victoza, metformin.  Will refer to endocrinology  RTC in 1 month   Medication Adjustments/Labs and Tests Ordered: Current medicines are reviewed at length with the patient today.  Concerns regarding medicines are outlined above.  Orders Placed This Encounter  Procedures  . Ambulatory referral to Cardiothoracic Surgery  . Ambulatory referral to Endocrinology  . Ambulatory referral to Nutrition and Diabetic Education  . EKG 12-Lead   Meds ordered this encounter  Medications  . ranolazine (RANEXA) 500 MG 12 hr tablet    Sig: Take 1 tablet (500 mg total) by mouth 2 (two) times daily.    Dispense:  180 tablet    Refill:  3    Patient Instructions  Medication Instructions:  START RANEXA 500 mg TWO TIMES DAILY  *If you need a refill on your cardiac medications before your next appointment, please call your pharmacy*  Lab Work: NONE  Testing/Procedures: NONE  Follow-Up: At [facility], you and your health needs are our priority.  As part of our continuing mission to provide you with exceptional heart care, we have created designated Provider Care Teams.  These Care Teams include your primary Cardiologist (physician) and Advanced Practice Providers (APPs -  Physician Assistants and Nurse Practitioners) who all work together to provide you with the care you need, when you need it.  Your next appointment:   1 month(s)  The format for your next appointment:   In Person  Provider:   Oswaldo Milian, MD  Other Instructions You have been referred to Cardiothoracic Surgeon --their office will call to schedule you an appointment  You have been referred to Endocrinology --their office will call to schedule you an  appointment  You have been referred to Nutrition and Diabetic Education --they will call to schedule you        Signed, Donato Heinz, MD  10/16/2019 12:58 PM    [place]

## 2019-10-16 ENCOUNTER — Other Ambulatory Visit: Payer: Self-pay

## 2019-10-16 ENCOUNTER — Encounter: Payer: Self-pay | Admitting: Cardiology

## 2019-10-16 ENCOUNTER — Ambulatory Visit (INDEPENDENT_AMBULATORY_CARE_PROVIDER_SITE_OTHER): Payer: 59 | Admitting: Cardiology

## 2019-10-16 VITALS — Ht 68.0 in | Wt 232.6 lb

## 2019-10-16 DIAGNOSIS — R42 Dizziness and giddiness: Secondary | ICD-10-CM | POA: Diagnosis not present

## 2019-10-16 DIAGNOSIS — Z794 Long term (current) use of insulin: Secondary | ICD-10-CM

## 2019-10-16 DIAGNOSIS — E1165 Type 2 diabetes mellitus with hyperglycemia: Secondary | ICD-10-CM

## 2019-10-16 DIAGNOSIS — Z720 Tobacco use: Secondary | ICD-10-CM

## 2019-10-16 DIAGNOSIS — I25118 Atherosclerotic heart disease of native coronary artery with other forms of angina pectoris: Secondary | ICD-10-CM

## 2019-10-16 DIAGNOSIS — E1140 Type 2 diabetes mellitus with diabetic neuropathy, unspecified: Secondary | ICD-10-CM | POA: Diagnosis not present

## 2019-10-16 DIAGNOSIS — E118 Type 2 diabetes mellitus with unspecified complications: Secondary | ICD-10-CM

## 2019-10-16 DIAGNOSIS — E785 Hyperlipidemia, unspecified: Secondary | ICD-10-CM | POA: Diagnosis not present

## 2019-10-16 DIAGNOSIS — IMO0002 Reserved for concepts with insufficient information to code with codable children: Secondary | ICD-10-CM

## 2019-10-16 MED ORDER — RANOLAZINE ER 500 MG PO TB12: 500.0000 mg | ORAL_TABLET | Freq: Two times a day (BID) | ORAL | 3 refills | Status: DC

## 2019-10-16 NOTE — Patient Instructions (Addendum)
Medication Instructions:  START RANEXA 500 mg TWO TIMES DAILY  *If you need a refill on your cardiac medications before your next appointment, please call your pharmacy*  Lab Work: NONE  Testing/Procedures: NONE  Follow-Up: At [facility], you and your health needs are our priority.  As part of our continuing mission to provide you with exceptional heart care, we have created designated Provider Care Teams.  These Care Teams include your primary Cardiologist (physician) and Advanced Practice Providers (APPs -  Physician Assistants and Nurse Practitioners) who all work together to provide you with the care you need, when you need it.  Your next appointment:   1 month(s)  The format for your next appointment:   In Person  Provider:   Epifanio Lesches, MD  Other Instructions You have been referred to Cardiothoracic Surgeon --their office will call to schedule you an appointment  You have been referred to Endocrinology --their office will call to schedule you an appointment  You have been referred to Nutrition and Diabetic Education --they will call to schedule you

## 2019-10-17 ENCOUNTER — Other Ambulatory Visit: Payer: Self-pay

## 2019-10-18 ENCOUNTER — Institutional Professional Consult (permissible substitution) (INDEPENDENT_AMBULATORY_CARE_PROVIDER_SITE_OTHER): Payer: 59 | Admitting: Cardiothoracic Surgery

## 2019-10-18 ENCOUNTER — Other Ambulatory Visit: Payer: Self-pay

## 2019-10-18 ENCOUNTER — Other Ambulatory Visit: Payer: Self-pay | Admitting: *Deleted

## 2019-10-18 ENCOUNTER — Encounter: Payer: Self-pay | Admitting: Cardiothoracic Surgery

## 2019-10-18 VITALS — BP 122/81 | HR 79 | Temp 97.7°F | Resp 20 | Ht 68.0 in | Wt 232.0 lb

## 2019-10-18 DIAGNOSIS — I2510 Atherosclerotic heart disease of native coronary artery without angina pectoris: Secondary | ICD-10-CM | POA: Diagnosis not present

## 2019-10-18 NOTE — Progress Notes (Signed)
PCP is Ladell Pier, MD Referring Provider is Donato Heinz*  Chief Complaint  Patient presents with  . Coronary Artery Disease    Surgical eval, Cardiac Cath 10/06/19, ECHO 06/14/19, Cardiac CT 06/15/19   Patient examined, images of coronary angiogram and echocardiogram and CTA of chest personally reviewed and counseled with patient  HP 47-year-year-old obese poorly controlled diabetic ex-smoker with recent diagnosis of three-vessel CAD.  Because of coronary anatomy she was not felt to be a candidate for PCI.  She underwent a evidence-based trial of aggressive medical therapy without improvement in her daily chest pain.  She has been compliant with her medications and diet.  She now presents for information regarding coronary bypass grafting for treatment of her severe multivessel coronary artery disease.  Cardiac cath showed normal LV EF, normal LVEDP.  No significant valve disease on echocardiogram.  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  She had necrosing fasciitis of her right inner thigh 2 years ago which required debridement wound VAC and healed secondarily.  Her father and sister have had CABG. Patient has had previous cholecystectomy appendectomy without anesthesia problems or bleeding problems She stopped smoking in 2017. The patient had COVID-19 viral infection in January 2020 but was not hospitalized. The patient is right-hand dominant. She takes Levemir and Metformin for diabetic control. Past Medical History:  Diagnosis Date  . Arthritis    fingers  . Cervical dysplasia   . Depression   . Diabetes mellitus   . Dyspnea 11/22/2013  . GERD (gastroesophageal reflux disease)   . High cholesterol   . Hypertension    no meds now  . Kidney stones   . PONV (postoperative nausea and vomiting)   . Sleep apnea    lost 70lbs no cpap x66yrs now     Past Surgical History:  Procedure Laterality Date  . CARDIAC CATHETERIZATION     4-80yrs ago  . CERVICAL CONIZATION W/BX N/A 05/04/2018   Procedure: CONIZATION CERVIX WITH BIOPSY - COLD KNIFE;  Surgeon: Chancy Milroy, MD;  Location: [facility];  Service: Gynecology;  Laterality: N/A;  . CHOLECYSTECTOMY    . ENDOMETRIAL ABLATION     6 years ago  . IRRIGATION AND DEBRIDEMENT ABSCESS Right 03/17/2017   Procedure: IRRIGATION AND DEBRIDEMENT RIGHT THIGH ABSCESS;  Surgeon: Michael Boston, MD;  Location: [facility];  Service: General;  Laterality: Right;  . LAPAROSCOPIC APPENDECTOMY  10/04/2011   Procedure: APPENDECTOMY LAPAROSCOPIC;  Surgeon: Judieth Keens, DO;  Location: [facility];  Service: General;  Laterality: N/A;  . LEFT HEART CATH AND CORONARY ANGIOGRAPHY N/A 10/06/2019   Procedure: LEFT HEART CATH AND CORONARY ANGIOGRAPHY;  Surgeon: Martinique, Ardyce Heyer M, MD;  Location: [facility];  Service: Cardiovascular;  Laterality: N/A;    Family History  Problem Relation Age of Onset  . Aneurysm Mother   . Heart attack Father   . Stroke Father   . Breast cancer Paternal Grandmother     Social History Social History   Tobacco Use  . Smoking status: Former Smoker    Packs/day: 0.00    Years: 24.00    Pack years: 0.00    Types: E-cigarettes  . Smokeless tobacco: Current User  . Tobacco comment: vape  Substance Use Topics  . Alcohol use: Yes    Comment: rare  . Drug use: No    Current  Outpatient Medications  Medication Sig Dispense Refill  . acetaminophen (TYLENOL) 500 MG tablet Take 1,000 mg by mouth every 6 (six) hours as needed for moderate pain or headache.    Marland Kitchen aspirin EC 81 MG tablet Take 1 tablet (81 mg total) by mouth daily. 90 tablet 3  . atorvastatin (LIPITOR) 80 MG tablet Take 1 tablet (80 mg total) by mouth daily at 6 PM. 30 tablet 2  . esomeprazole (NEXIUM) 20 MG capsule Take 20 mg by mouth daily.     Marland Kitchen ezetimibe (ZETIA) 10 MG tablet Take 1 tablet (10 mg  total) by mouth daily. 90 tablet 3  . FLUoxetine (PROZAC) 40 MG capsule Take one tab daily with a 20 mg daily (Patient taking differently: Take 40 mg by mouth daily. (take with 20mg  to equal 60mg  total)) 30 capsule 2  . fluticasone (FLONASE) 50 MCG/ACT nasal spray Place 2 sprays into both nostrils daily as needed for allergies or rhinitis.    gabapentin (NEURONTIN) 300 MG capsule Take 3 capsules (900 mg total) by mouth 2 (two) times daily. 180 capsule 6  . glucose blood (TRUE METRIX BLOOD GLUCOSE TEST) test strip Use as instructed 100 each 12  . Insulin Detemir (LEVEMIR FLEXTOUCH) 100 UNIT/ML Pen Inject 53 Units into the skin 2 (two) times daily. 15 mL 11  . Insulin Pen Needle (B-D UF III MINI PEN NEEDLES) 31G X 5 MM MISC Use as instructed. Inject into the skin three times daily 100 each 3  . isosorbide mononitrate (IMDUR) 60 MG 24 hr tablet Take 1 tablet (60 mg total) by mouth daily. 90 tablet 3  . liraglutide (VICTOZA) 18 MG/3ML SOPN Inject 0.3 mLs (1.8 mg total) into the skin daily. (Patient taking differently: Inject 1.8 mg into the skin every evening. ) 9 mL 2  . Melatonin 3 MG CAPS Take 3 mg by mouth at bedtime as needed (sleep).    . metFORMIN (GLUCOPHAGE-XR) 500 MG 24 hr tablet Take 2 tablets (1,000 mg total) by mouth daily with breakfast.    . nitroGLYCERIN (NITROSTAT) 0.4 MG SL tablet Place 1 tablet (0.4 mg total) under the tongue every 5 (five) minutes as needed for chest pain. 25 tablet 2  . ranolazine (RANEXA) 500 MG 12 hr tablet Take 1 tablet (500 mg total) by mouth 2 (two) times daily. 180 tablet 3  . TRUEPLUS SAFETY LANCETS 28G MISC Use as directed 100 each 3  . metoprolol tartrate (LOPRESSOR) 25 MG tablet Take 1 tablet (25 mg total) by mouth 2 (two) times daily. 180 tablet 3   No current facility-administered medications for this visit.    Allergies  Allergen Reactions  . Codeine Itching  . Sulfa Antibiotics Hives                      Review of Systems :  [ y ] = yes,  [  ] = no        General :  Weight gain [ y  ]    Weight loss  [   ]  Fatigue  ]  Fever [  ]  Chills  [  ]                                          HEENT    Headache [  ]  Dizziness [  ]  Blurred  vision [  ] Glaucoma  [  ]                          Nosebleeds [  ] Painful or loose teeth [  ]        Cardiac :  Chest pain/ pressure [ y ]  Resting SOB [  ] exertional SOB [ y ]                        Orthopnea [  ]  Pedal edema  [  ]  Palpitations [  ] Syncope/presyncope [ ]                         Paroxysmal nocturnal dyspnea [  ]         Pulmonary : cough [  ]  wheezing [  ]  Hemoptysis [  ] Sputum [  ] Snoring [  ]                              Pneumothorax [  ]  Sleep apnea [  ]        GI : Vomiting [  ]  Dysphagia [  ]  Melena  [  ]  Abdominal pain [  ] BRBPR [  ]              Heart burn [  ]  Constipation [  ] Diarrhea  [  ] Colonoscopy [   ]        GU : Hematuria [  ]  Dysuria [  ]  Nocturia [  ] UTI's [  ]        Vascular : Claudication [  ]  Rest pain [  ]  DVT [  ] Vein stripping [  ] leg ulcers [  ]                          TIA [  ] Stroke [  ]  Varicose veins [  ]        NEURO :  Headaches  [  ] Seizures [  ] Vision changes [  ] Paresthesias [  ]                                               Musculoskeletal :  Arthritis [  ] Gout  [  ]  Back pain [  ]  Joint pain [  ]        Skin :  Rash [  ]  Melanoma [  ] Sores [  ]        Heme : Bleeding problems [  ]Clotting Disorders [  ] Anemia [  ]Blood Transfusion [ ]         Endocrine : Diabetes [ y ] Heat or Cold intolerance [  ] Polyuria [  ]excessive thirst [ ]         Psych : Depression [  ]  Anxiety [  ]  Psych hospitalizations [  ] Memory change [  ]  BP 122/81   Pulse 79   Temp 97.7 F (36.5 C) (Skin)   Resp 20   Ht 5\' 8"  (1.727 m)   Wt 232 lb (105.2 kg)   LMP 10/03/2011   SpO2 98% Comment: RA  BMI 35.28 kg/m  Physical Exam      Physical Exam  General: Overweight, intelligent healthy-appearing female no acute distress HEENT: Normocephalic pupils equal , dentition adequate Neck: Supple without JVD, adenopathy, or bruit Chest: Clear to auscultation, symmetrical breath sounds, no rhonchi, no tenderness             or deformity Cardiovascular: Regular rate and rhythm, no murmur, no gallop, peripheral pulses             palpable in all extremities Abdomen:  Soft, nontender, no palpable mass or organomegaly Extremities: Warm, well-perfused, no clubbing cyanosis edema or tenderness,              no venous stasis changes of the legs Rectal/GU: Deferred Neuro: Grossly non--focal and symmetrical throughout Skin: Clean and dry without rash or ulceration   Diagnostic Tests: Cardiac cath-severe multivessel CAD, 85% LAD, 80% first diagonal, 85% second diagonal, 85% distal posterior descending  Impression: Symptomatic multivessel CAD.  Normal LV function. Patient has failed medical therapy. Patient is not a candidate for PCI according to cardiology  Plan: Multivessel CABG planned for Tuesday, February 23 using arterial grafts to the LAD and dominant diagonal and vein graft to the secondary diagonal and distal posterior descending procedure indication benefits and risk of been discussed with patient who understands and agrees to proceed.   February 25, MD [facility] [phone]

## 2019-10-19 NOTE — Progress Notes (Signed)
[facility] - [place], [place] - [address] [place] [address] Phone: [phone] Fax: [phone]      Your procedure is scheduled on Tuesday, October 24, 2019.  Report to [facility] Main Entrance "A" at 5:30 A.M., and check in at the Admitting office.  Call this number if you have problems the morning of surgery:  [phone]  Call [phone] if you have any questions prior to your surgery date Monday-Friday 8am-4pm    Remember:  Do not eat or drink after midnight the night before your surgery     Take these medicines the morning of surgery with A SIP OF WATER: atorvastatin (LIPITOR) esomeprazole (NEXIUM) FLUoxetine (PROZAC) gabapentin (NEURONTIN) metoprolol tartrate (LOPRESSOR)   If needed:  acetaminophen (TYLENOL) fluticasone (FLONASE) nitroGLYCERIN (NITROSTAT)  7 days prior to surgery STOP taking any Aspirin (unless otherwise instructed by your surgeon), Aleve, Naproxen, Ibuprofen, Motrin, Advil, Goody's, BC's, all herbal medications, fish oil, and all vitamins.  Follow your surgeon's instructions on when to stop Aspirin.  If no instructions were given by your surgeon then you will need to call the office to get those instructions.     WHAT DO I DO ABOUT MY DIABETES MEDICATION?   Marland Kitchen Do not take oral diabetes medicines pills (metFORMIN (GLUCOPHAGE-XR) the morning of surgery.  . THE NIGHT BEFORE SURGERY, take _____25______ units of ___Insulin Detemir (LEVEMIR FLEXTOUCH)_insulin.       . THE MORNING OF SURGERY, take ____25____ units of __Insulin Detemir (LEVEMIR FLEXTOUCH)___insulin.  . The day of surgery, do not take other diabetes injectables, including Victoza (liraglutide)  . If your CBG is greater than 220 mg/dL, you may take  of your sliding scale (correction) dose of insulin.   HOW TO MANAGE YOUR DIABETES BEFORE AND AFTER SURGERY  Why is it important to control my blood sugar before and after  surgery? . Improving blood sugar levels before and after surgery helps healing and can limit problems. . A way of improving blood sugar control is eating a healthy diet by: o  Eating less sugar and carbohydrates o  Increasing activity/exercise o  Talking with your doctor about reaching your blood sugar goals . High blood sugars (greater than 180 mg/dL) can raise your risk of infections and slow your recovery, so you will need to focus on controlling your diabetes during the weeks before surgery. . Make sure that the doctor who takes care of your diabetes knows about your planned surgery including the date and location.  How do I manage my blood sugar before surgery? . Check your blood sugar at least 4 times a day, starting 2 days before surgery, to make sure that the level is not too high or low. . Check your blood sugar the morning of your surgery when you wake up and every 2 hours until you get to the Short Stay unit. o If your blood sugar is less than 70 mg/dL, you will need to treat for low blood sugar: - Do not take insulin. - Treat a low blood sugar (less than 70 mg/dL) with  cup of clear juice (cranberry or apple), 4 glucose tablets, OR glucose gel. - Recheck blood sugar in 15 minutes after treatment (to make sure it is greater than 70 mg/dL). If your blood sugar is not greater than 70 mg/dL on recheck, call [phone] for further instructions. . Report your blood sugar to the short stay nurse when you get to Short Stay.  . If  you are admitted to the hospital after surgery: o Your blood sugar will be checked by the staff and you will probably be given insulin after surgery (instead of oral diabetes medicines) to make sure you have good blood sugar levels. o The goal for blood sugar control after surgery is 80-180 mg/dL.    The Morning of Surgery  Do not wear jewelry, make-up or nail polish.  Do not wear lotions, powders, perfumes, or deodorant  Do not shave 48 hours prior to  surgery.  Do not bring valuables to the hospital.  [facility] is not responsible for any belongings or valuables.  If you are a smoker, DO NOT Smoke 24 hours prior to surgery  If you wear a CPAP at night please bring your mask the morning of surgery   Remember that you must have someone to transport you home after your surgery, and remain with you for 24 hours if you are discharged the same day.   Please bring cases for contacts, glasses, hearing aids, dentures or bridgework because it cannot be worn into surgery.    Leave your suitcase in the car.  After surgery it may be brought to your room.  For patients admitted to the hospital, discharge time will be determined by your treatment team.  Patients discharged the day of surgery will not be allowed to drive home.    Special instructions:   [facility]- Preparing For Surgery  Before surgery, you can play an important role. Because skin is not sterile, your skin needs to be as free of germs as possible. You can reduce the number of germs on your skin by washing with CHG (chlorahexidine gluconate) Soap before surgery.  CHG is an antiseptic cleaner which kills germs and bonds with the skin to continue killing germs even after washing.    Oral Hygiene is also important to reduce your risk of infection.  Remember - BRUSH YOUR TEETH THE MORNING OF SURGERY WITH YOUR REGULAR TOOTHPASTE  Please do not use if you have an allergy to CHG or antibacterial soaps. If your skin becomes reddened/irritated stop using the CHG.  Do not shave (including legs and underarms) for at least 48 hours prior to first CHG shower. It is OK to shave your face.  Please follow these instructions carefully.   1. Shower the NIGHT BEFORE SURGERY and the MORNING OF SURGERY with CHG Soap.   2. If you chose to wash your hair, wash your hair first as usual with your normal shampoo.  3. After you shampoo, rinse your hair and body thoroughly to remove the  shampoo.  4. Use CHG as you would any other liquid soap. You can apply CHG directly to the skin and wash gently with a scrungie or a clean washcloth.   5. Apply the CHG Soap to your body ONLY FROM THE NECK DOWN.  Do not use on open wounds or open sores. Avoid contact with your eyes, ears, mouth and genitals (private parts). Wash Face and genitals (private parts)  with your normal soap.   6. Wash thoroughly, paying special attention to the area where your surgery will be performed.  7. Thoroughly rinse your body with warm water from the neck down.  8. DO NOT shower/wash with your normal soap after using and rinsing off the CHG Soap.  9. Pat yourself dry with a CLEAN TOWEL.  10. Wear CLEAN PAJAMAS to bed the night before surgery, wear comfortable clothes the morning of surgery  11. Place  CLEAN SHEETS on your bed the night of your first shower and DO NOT SLEEP WITH PETS.    Day of Surgery:  Please shower the morning of surgery with the CHG soap Do not apply any deodorants/lotions. Please wear clean clothes to the hospital/surgery center.   Remember to brush your teeth WITH YOUR REGULAR TOOTHPASTE.   Please read over the following fact sheets that you were given.

## 2019-10-20 ENCOUNTER — Ambulatory Visit (HOSPITAL_COMMUNITY)
Admission: RE | Admit: 2019-10-20 | Discharge: 2019-10-20 | Disposition: A | Discharge status: Home / Self Care | Payer: 59 | Source: Ambulatory Visit | Attending: Cardiothoracic Surgery | Admitting: Cardiothoracic Surgery

## 2019-10-20 ENCOUNTER — Inpatient Hospital Stay (HOSPITAL_COMMUNITY)
Admission: RE | Admit: 2019-10-20 | Discharge: 2019-10-20 | Disposition: A | Discharge status: Home / Self Care | Payer: 59 | Source: Ambulatory Visit

## 2019-10-20 ENCOUNTER — Other Ambulatory Visit: Payer: Self-pay

## 2019-10-20 ENCOUNTER — Telehealth: Payer: Self-pay

## 2019-10-20 ENCOUNTER — Encounter (HOSPITAL_COMMUNITY)
Admission: RE | Admit: 2019-10-20 | Discharge: 2019-10-20 | Disposition: A | Discharge status: Home / Self Care | Payer: 59 | Source: Ambulatory Visit | Attending: Cardiothoracic Surgery | Admitting: Cardiothoracic Surgery

## 2019-10-20 ENCOUNTER — Encounter (HOSPITAL_COMMUNITY): Payer: Self-pay

## 2019-10-20 DIAGNOSIS — IMO0002 Reserved for concepts with insufficient information to code with codable children: Secondary | ICD-10-CM

## 2019-10-20 DIAGNOSIS — E1165 Type 2 diabetes mellitus with hyperglycemia: Secondary | ICD-10-CM

## 2019-10-20 DIAGNOSIS — I2510 Atherosclerotic heart disease of native coronary artery without angina pectoris: Secondary | ICD-10-CM | POA: Insufficient documentation

## 2019-10-20 DIAGNOSIS — Z01818 Encounter for other preprocedural examination: Secondary | ICD-10-CM | POA: Insufficient documentation

## 2019-10-20 DIAGNOSIS — E1140 Type 2 diabetes mellitus with diabetic neuropathy, unspecified: Secondary | ICD-10-CM

## 2019-10-20 HISTORY — DX: Anxiety disorder, unspecified: F41.9

## 2019-10-20 HISTORY — DX: Personal history of urinary calculi: Z87.442

## 2019-10-20 HISTORY — DX: Polyneuropathy, unspecified: G62.9

## 2019-10-20 HISTORY — DX: Atherosclerotic heart disease of native coronary artery without angina pectoris: I25.10

## 2019-10-20 LAB — COMPREHENSIVE METABOLIC PANEL
ALT: 19 U/L (ref 0–44)
AST: 15 U/L (ref 15–41)
Albumin: 3.7 g/dL (ref 3.5–5.0)
Alkaline Phosphatase: 112 U/L (ref 38–126)
Anion gap: 13 (ref 5–15)
BUN: 27 mg/dL — ABNORMAL HIGH (ref 6–20)
CO2: 23 mmol/L (ref 22–32)
Calcium: 9.6 mg/dL (ref 8.9–10.3)
Chloride: 100 mmol/L (ref 98–111)
Creatinine, Ser: 1.19 mg/dL — ABNORMAL HIGH (ref 0.44–1.00)
GFR calc Af Amer: >60 mL/min (ref >60)
GFR calc non Af Amer: 53 mL/min — ABNORMAL LOW (ref >60)
Glucose, Bld: 293 mg/dL — ABNORMAL HIGH (ref 70–99)
Potassium: 4.2 mmol/L (ref 3.5–5.1)
Sodium: 136 mmol/L (ref 135–145)
Total Bilirubin: 0.5 mg/dL (ref 0.3–1.2)
Total Protein: 6.7 g/dL (ref 6.5–8.1)

## 2019-10-20 LAB — URINALYSIS, ROUTINE W REFLEX MICROSCOPIC
Bacteria, UA: NONE SEEN
Bilirubin Urine: NEGATIVE
Glucose, UA: >=500 mg/dL — AB
Hgb urine dipstick: NEGATIVE
Ketones, ur: NEGATIVE mg/dL
Leukocytes,Ua: NEGATIVE
Nitrite: NEGATIVE
Protein, ur: NEGATIVE mg/dL
Specific Gravity, Urine: 1.02 (ref 1.005–1.030)
pH: 6 (ref 5.0–8.0)

## 2019-10-20 LAB — SURGICAL PCR SCREEN
MRSA, PCR: NEGATIVE
Staphylococcus aureus: NEGATIVE

## 2019-10-20 LAB — CBC
HCT: 42.5 % (ref 36.0–46.0)
Hemoglobin: 13.8 g/dL (ref 12.0–15.0)
MCH: 27.8 pg (ref 26.0–34.0)
MCHC: 32.5 g/dL (ref 30.0–36.0)
MCV: 85.7 fL (ref 80.0–100.0)
Platelets: 246 10*3/uL (ref 150–400)
RBC: 4.96 MIL/uL (ref 3.87–5.11)
RDW: 12.7 % (ref 11.5–15.5)
WBC: 7 10*3/uL (ref 4.0–10.5)
nRBC: 0 % (ref 0.0–0.2)

## 2019-10-20 LAB — BLOOD GAS, ARTERIAL
Acid-Base Excess: 2.1 mmol/L — ABNORMAL HIGH (ref 0.0–2.0)
Bicarbonate: 26.2 mmol/L (ref 20.0–28.0)
Drawn by: 421801
FIO2: 21
O2 Saturation: 96.9 %
Patient temperature: 37
pCO2 arterial: 41.5 mmHg (ref 32.0–48.0)
pH, Arterial: 7.417 (ref 7.350–7.450)
pO2, Arterial: 86 mmHg (ref 83.0–108.0)

## 2019-10-20 LAB — ABO/RH: ABO/RH(D): O POS

## 2019-10-20 LAB — HEMOGLOBIN A1C
Hgb A1c MFr Bld: 15.1 % — ABNORMAL HIGH (ref 4.8–5.6)
Mean Plasma Glucose: 386.67 mg/dL

## 2019-10-20 LAB — GLUCOSE, CAPILLARY: Glucose-Capillary: 305 mg/dL — ABNORMAL HIGH (ref 70–99)

## 2019-10-20 LAB — APTT: aPTT: 23 seconds — ABNORMAL LOW (ref 24–36)

## 2019-10-20 LAB — PROTIME-INR
INR: 0.9 (ref 0.8–1.2)
Prothrombin Time: 12 seconds (ref 11.4–15.2)

## 2019-10-20 NOTE — Telephone Encounter (Signed)
Patient was instructed to increase her Levimir to 60 units twice a day starting tonight, due to her blood glucose levels being high at pre op testing appt.

## 2019-10-20 NOTE — Progress Notes (Signed)
Pre-CABG Dopplers completed Refer to "CV Proc" under chart review to view preliminary results.  10/20/2019 4:02 PM Eula Fried., MHA, RVT, RDCS, RDMS

## 2019-10-20 NOTE — Progress Notes (Signed)
Patient was positive for COVID on 09/05/19, no retest needed before procedure.  Results are in [facility]

## 2019-10-20 NOTE — Telephone Encounter (Signed)
-----   Message from Davina Poke, RN sent at 10/20/2019  3:27 PM EST ----- Hey,   Patient went to pre-op appts today and her blood sugar was high.  Per PVT: Please have patient increase Levimir to 60 units twice daily, starting tonight!  Can you call her for me?  [employer]

## 2019-10-20 NOTE — Progress Notes (Addendum)
PCP - [facility]  Cardiologist - DR. Schumann  Chest x-ray - 10/20/2019  EKG - 10/16/2019  Stress Test - 2019  ECHO - 2020  Cardiac Cath - 25/2021   Sleep Study - 2008  CPAP -  Yes, does not wear it since 70 lb weight loss.  LABS-CBC, CMP, PT, PTT, ABG, T/S, A1C, UA,  PCR  ASA- continue and take am of surgery- Ms Radin said that is what Dr. Maren Beach told her.  A1C 15.1 I notified Ryan , Gluose 386 Fasting Blood Sugar - 300's Checks Blood Sugar ___1__ times a day  Glucose was 386 today. Ms Mcconaughy said she ate a banana around 1100, she had a bagle and cream cheese for breakfast.  Anesthesia-Ms Bradshaw reports that she has chest pain frequently, reports that she usually takes every day, but has not taken it today. 10/19/2019 took x 2, 1 time she had to take 2.  Ms Benassi said that the chest pain has not changed since she saw Dr. Maren Beach.   Pt denies sob, or fever at this time.  Patient reports that she gets short of breath walking a long way - "from parking garage to admitting."  All instructions explained to the pt, with a verbal understanding of the material. Pt agrees to go over the instructions while at home for a better understanding.  Ms Verne was positive for Covid 09/07/2019 and will not be tested again before surgery.   I will forward chart to anesthesia PA-C.

## 2019-10-22 MED ORDER — METFORMIN HCL ER 500 MG PO TB24: 1000.0000 mg | ORAL_TABLET | Freq: Every day | ORAL | 1 refills | Status: DC

## 2019-10-23 MED ORDER — MILRINONE LACTATE IN DEXTROSE 20-5 MG/100ML-% IV SOLN
0.3000 ug/kg/min | INTRAVENOUS | Status: DC
  Filled 2019-10-23: qty 100

## 2019-10-23 MED ORDER — INSULIN REGULAR(HUMAN) IN NACL 100-0.9 UT/100ML-% IV SOLN
INTRAVENOUS | Status: AC
  Administered 2019-10-24: 2 [IU]/h via INTRAVENOUS
  Filled 2019-10-23: qty 100

## 2019-10-23 MED ORDER — SODIUM CHLORIDE 0.9 % IV SOLN
1.5000 g | INTRAVENOUS | Status: AC
  Administered 2019-10-24: 1.5 g via INTRAVENOUS
  Administered 2019-10-24: 750 mg via INTRAVENOUS
  Filled 2019-10-23: qty 1.5

## 2019-10-23 MED ORDER — EPINEPHRINE HCL 5 MG/250ML IV SOLN IN NS
0.0000 ug/min | INTRAVENOUS | Status: DC
  Filled 2019-10-23: qty 250

## 2019-10-23 MED ORDER — VANCOMYCIN HCL 1500 MG/300ML IV SOLN
1500.0000 mg | INTRAVENOUS | Status: DC
  Filled 2019-10-23: qty 300

## 2019-10-23 MED ORDER — POTASSIUM CHLORIDE 2 MEQ/ML IV SOLN
80.0000 meq | INTRAVENOUS | Status: DC
  Filled 2019-10-23: qty 40

## 2019-10-23 MED ORDER — PLASMA-LYTE 148 IV SOLN
INTRAVENOUS | Status: AC
  Administered 2019-10-24: 500 mL
  Filled 2019-10-23: qty 2.5

## 2019-10-23 MED ORDER — MAGNESIUM SULFATE 50 % IJ SOLN
40.0000 meq | INTRAMUSCULAR | Status: DC
  Filled 2019-10-23: qty 9.85

## 2019-10-23 MED ORDER — TRANEXAMIC ACID 1000 MG/10ML IV SOLN
1.5000 mg/kg/h | INTRAVENOUS | Status: AC
  Administered 2019-10-24: 1.5 mg/kg/h via INTRAVENOUS
  Filled 2019-10-23: qty 25

## 2019-10-23 MED ORDER — SODIUM CHLORIDE 0.9 % IV SOLN
INTRAVENOUS | Status: DC
  Filled 2019-10-23: qty 30

## 2019-10-23 MED ORDER — NITROGLYCERIN IN D5W 200-5 MCG/ML-% IV SOLN
2.0000 ug/min | INTRAVENOUS | Status: DC
  Filled 2019-10-23: qty 250

## 2019-10-23 MED ORDER — TRANEXAMIC ACID (OHS) PUMP PRIME SOLUTION
2.0000 mg/kg | INTRAVENOUS | Status: DC
  Filled 2019-10-23: qty 2.08

## 2019-10-23 MED ORDER — NOREPINEPHRINE 4 MG/250ML-% IV SOLN
0.0000 ug/min | INTRAVENOUS | Status: DC
  Filled 2019-10-23: qty 250

## 2019-10-23 MED ORDER — TRANEXAMIC ACID (OHS) BOLUS VIA INFUSION
15.0000 mg/kg | INTRAVENOUS | Status: AC
  Administered 2019-10-24: 1560 mg via INTRAVENOUS
  Filled 2019-10-23: qty 1560

## 2019-10-23 MED ORDER — DEXMEDETOMIDINE HCL IN NACL 400 MCG/100ML IV SOLN
0.1000 ug/kg/h | INTRAVENOUS | Status: AC
  Administered 2019-10-24: .5 ug/kg/h via INTRAVENOUS
  Filled 2019-10-23: qty 100

## 2019-10-23 MED ORDER — SODIUM CHLORIDE 0.9 % IV SOLN
750.0000 mg | INTRAVENOUS | Status: DC
  Filled 2019-10-23: qty 750

## 2019-10-23 MED ORDER — PHENYLEPHRINE HCL-NACL 20-0.9 MG/250ML-% IV SOLN
30.0000 ug/min | INTRAVENOUS | Status: DC
  Filled 2019-10-23: qty 250

## 2019-10-23 NOTE — Progress Notes (Addendum)
Anesthesia Chart Review:  Case: [id number] Date/Time: 10/24/19 0715   Procedures:      CORONARY ARTERY BYPASS GRAFTING (CABG) (N/A Chest)     TRANSESOPHAGEAL ECHOCARDIOGRAM (TEE) (N/A )   Anesthesia type: General   Pre-op diagnosis: CAD   [facility]: [facility] ROOM [facility] / [facility]   Surgeons: Kerin Perna, MD      DISCUSSION: Patient is a 49 year old female scheduled for the above procedure. Patient with 3V CAD, but with persistent stable angina with nearly daily Nitro use despite titration of CAD/antianginal therapy (some limitations due to low BP). She was referred for CABG. Of note, diabetes is poorly controlled with A1c of 15.1% (up from 12.2% 06/05/19) with reported fasting CBGs ~ 300. Dr. Donata Clay was notified on 10/20/19, and she was instructed to increased Levimir to 60 Units BID (up from 53 Units BID).   History includes former smoker (quit 2017), post-operative N/V, HTN, OSA (no CPAP after weight loss), hypercholesterolemia, CAD, dyspnea, poorly controlled DM2 (with neuropathy), necrotizing fascitis (RLE, s/p I&D, VAC 03/17/17). + COVID-19 09/05/19 but did not require hospitalization. BMI is consistent with obesity.   She had COVID-19 > 21 days ago but within past 90 days, so she should not need a presurgical COVID-19 test. She will get a fasting CBG on arrival. Do to uncontrolled DM and planned CABG, I did send an order for DM Coordinator Consult.    VS: BP 108/74   Pulse 83   Temp 36.8 C   Resp 19   Ht 5\' 8"  (1.727 m)   Wt 104 kg   LMP 10/03/2011   SpO2 100%   BMI 34.86 kg/m    PROVIDERS: 12/01/2011, MD is PCP [facility]) [facility], MD is cardiologist She has an upcoming visit with endocrinologist Epifanio Lesches, MD and dietician on 11/16/19.   LABS: Preoperative labs reviewed. See DISCUSSION regarding A1c. (all labs ordered are listed, but only abnormal results are displayed)  Labs Reviewed  GLUCOSE, CAPILLARY - Abnormal;  Notable for the following components:      Result Value   Glucose-Capillary 305 (*)    All other components within normal limits  APTT - Abnormal; Notable for the following components:   aPTT 23 (*)    All other components within normal limits  BLOOD GAS, ARTERIAL - Abnormal; Notable for the following components:   Acid-Base Excess 2.1 (*)    Allens test (pass/fail) BRACHIAL ARTERY (*)    All other components within normal limits  COMPREHENSIVE METABOLIC PANEL - Abnormal; Notable for the following components:   Glucose, Bld 293 (*)    BUN 27 (*)    Creatinine, Ser 1.19 (*)    GFR calc non Af Amer 53 (*)    All other components within normal limits  HEMOGLOBIN A1C - Abnormal; Notable for the following components:   Hgb A1c MFr Bld 15.1 (*)    All other components within normal limits  URINALYSIS, ROUTINE W REFLEX MICROSCOPIC - Abnormal; Notable for the following components:   Glucose, UA >=500 (*)    All other components within normal limits  SURGICAL PCR SCREEN  CBC  PROTIME-INR  TYPE AND SCREEN  ABO/RH     IMAGES: CXR 10/20/19: IMPRESSION: No active cardiopulmonary disease.    EKG: 10/16/19: NSR   CV: Carotid 10/18/19 10/20/19: Summary:  Right Carotid: Velocities in the right ICA are consistent with a 1-39%  stenosis.  Left Carotid: Velocities in the left ICA  are consistent with a 1-39%  stenosis.  Vertebrals: Bilateral vertebral arteries demonstrate antegrade flow.  Subclavians: Normal flow hemodynamics were seen in bilateral subclavian        arteries.    Cardiac cath 10/06/19:  Prox Cx lesion is 60% stenosed.  Mid Cx lesion is 70% stenosed.  RPDA lesion is 80% stenosed.  Mid LM to Ost LAD lesion is 35% stenosed.  1st Diag lesion is 85% stenosed.  2nd Diag lesion is 80% stenosed.  Mid LAD lesion is 80% stenosed.  LV end diastolic pressure is normal. 1. Three vessel CAD.    - 80% mid LAD after the second diagonal    - 85% slit like stenosis at  the origin of the first diagonal- best seen in the LAO caudal shot.    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 Normal LVEDP - Plan: reviewed with interventional colleagues. Her anatomy doesn't offer good PCI options except the mid LAD. Treating the ostial disease in the diagonal branches would likely require stenting into the LAD. The LCx and PDA are too small for PCI. I would aggressively treat medically. If symptoms persist I would consider surgical consultation.    Echo 06/14/19: IMPRESSIONS  1. Left ventricular ejection fraction, by visual estimation, is 60 to  65%. The left ventricle has normal function. Normal left ventricular size.  There is no left ventricular hypertrophy.  2. Left ventricular diastolic Doppler parameters are consistent with  impaired relaxation pattern of LV diastolic filling.  3. Global right ventricle has normal systolic function.The right  ventricular size is normal. No increase in right ventricular wall  thickness.  4. Left atrial size was normal.  5. Right atrial size was normal.  6. The mitral valve is normal in structure. No evidence of mitral valve  regurgitation. No evidence of mitral stenosis.  7. The tricuspid valve is normal in structure. Tricuspid valve  regurgitation was not visualized by color flow Doppler.  8. The aortic valve is normal in structure. Aortic valve regurgitation  was not visualized by color flow Doppler. Structurally normal aortic  valve, with no evidence of sclerosis or stenosis.  9. The pulmonic valve was normal in structure. Pulmonic valve  regurgitation is not visualized by color flow Doppler.  10. The inferior vena cava is normal in size with greater than 50%  respiratory variability, suggesting right atrial pressure of 3 mmHg.    Past Medical History:  Diagnosis Date  . Anxiety   . Arthritis    fingers  . Cervical dysplasia   .  Coronary artery disease   . Depression   . Diabetes mellitus    Type II  . Dyspnea 11/22/2013  . GERD (gastroesophageal reflux disease)   . High cholesterol   . History of kidney stones    passed  . Hypertension    no meds now  . Kidney stones   . Neuropathy    feet and legs  . PONV (postoperative nausea and vomiting)   . Sleep apnea    lost 70lbs no cpap x33yrs now    Past Surgical History:  Procedure Laterality Date  . CARDIAC CATHETERIZATION     4-67yrs ago  . CERVICAL CONIZATION W/BX N/A 05/04/2018   Procedure: CONIZATION CERVIX WITH BIOPSY - COLD KNIFE;  Surgeon: Chancy Milroy, MD;  Location: [place];  Service: Gynecology;  Laterality: N/A;  .  CHOLECYSTECTOMY    . ENDOMETRIAL ABLATION     6 years ago  . INCISION AND DRAINAGE     for boil- buttocks  . INCISION AND DRAINAGE Left    Leg, Nephrotoxin fasciotimy  . IRRIGATION AND DEBRIDEMENT ABSCESS Right 03/17/2017   Procedure: IRRIGATION AND DEBRIDEMENT RIGHT THIGH ABSCESS;  Surgeon: Karie Soda, MD;  Location: [facility];  Service: General;  Laterality: Right;  . LAPAROSCOPIC APPENDECTOMY  10/04/2011   Procedure: APPENDECTOMY LAPAROSCOPIC;  Surgeon: Rulon Abide, DO;  Location: [facility];  Service: General;  Laterality: N/A;  . LEFT HEART CATH AND CORONARY ANGIOGRAPHY N/A 10/06/2019   Procedure: LEFT HEART CATH AND CORONARY ANGIOGRAPHY;  Surgeon: [place], Peter M, MD;  Location: [facility] INVASIVE CV LAB;  Service: Cardiovascular;  Laterality: N/A;    MEDICATIONS: . acetaminophen (TYLENOL) 500 MG tablet  . aspirin EC 81 MG tablet  . atorvastatin (LIPITOR) 80 MG tablet  . esomeprazole (NEXIUM) 20 MG capsule  . ezetimibe (ZETIA) 10 MG tablet  . FLUoxetine (PROZAC) 40 MG capsule  . fluticasone (FLONASE) 50 MCG/ACT nasal spray  . gabapentin (NEURONTIN) 300 MG capsule  . glucose blood (TRUE METRIX BLOOD GLUCOSE TEST) test strip  . Insulin Detemir (LEVEMIR FLEXTOUCH) 100 UNIT/ML Pen  . Insulin Pen Needle (B-D UF  III MINI PEN NEEDLES) 31G X 5 MM MISC  . isosorbide mononitrate (IMDUR) 60 MG 24 hr tablet  . liraglutide (VICTOZA) 18 MG/3ML SOPN  . Melatonin 10 MG CAPS  . metFORMIN (GLUCOPHAGE-XR) 500 MG 24 hr tablet  . metoprolol tartrate (LOPRESSOR) 25 MG tablet  . nitroGLYCERIN (NITROSTAT) 0.4 MG SL tablet  . ranolazine (RANEXA) 500 MG 12 hr tablet  . TRUEPLUS SAFETY LANCETS 28G MISC   No current facility-administered medications for this encounter.   Melene Muller ON 10/24/2019] cefUROXime (ZINACEF) 1.5 g in sodium chloride 0.9 % 100 mL IVPB  . [START ON 10/24/2019] cefUROXime (ZINACEF) 750 mg in sodium chloride 0.9 % 100 mL IVPB  . [START ON 10/24/2019] dexmedetomidine (PRECEDEX) 400 MCG/100ML (4 mcg/mL) infusion  . [START ON 10/24/2019] EPINEPHrine (ADRENALIN) 4 mg in NS 250 mL (0.016 mg/mL) premix infusion  . [START ON 10/24/2019] heparin 2,500 Units, papaverine 30 mg in electrolyte-148 (PLASMALYTE-148) 500 mL irrigation  . [START ON 10/24/2019] heparin 30,000 units/NS 1000 mL solution for CELLSAVER  . [START ON 10/24/2019] insulin regular, human (MYXREDLIN) 100 units/ 100 mL infusion  . [START ON 10/24/2019] magnesium sulfate (IV Push/IM) injection 40 mEq  . [START ON 10/24/2019] milrinone (PRIMACOR) 20 MG/100 ML (0.2 mg/mL) infusion  . [START ON 10/24/2019] nitroGLYCERIN 50 mg in dextrose 5 % 250 mL (0.2 mg/mL) infusion  . [START ON 10/24/2019] norepinephrine (LEVOPHED) 4mg  in premix infusion  . [START ON 10/24/2019] phenylephrine (NEOSYNEPHRINE) 20-0.9 MG/250ML-% infusion  . [START ON 10/24/2019] potassium chloride injection 80 mEq  . [START ON 10/24/2019] tranexamic acid (CYKLOKAPRON) 2,500 mg in sodium chloride 0.9 % 250 mL (10 mg/mL) infusion  . [START ON 10/24/2019] tranexamic acid (CYKLOKAPRON) bolus via infusion - over 30 minutes 1,560 mg  . [START ON 10/24/2019] tranexamic acid (CYKLOKAPRON) pump prime solution 208 mg  . [START ON 10/24/2019] vancomycin (VANCOREADY) IVPB 1500 mg/300 mL      10/26/2019, PA-C Surgical Short Stay/Anesthesiology [facility] Phone [phone] [facility] Phone [phone] 10/23/2019 10:04 AM

## 2019-10-24 ENCOUNTER — Inpatient Hospital Stay (HOSPITAL_COMMUNITY)
Admission: RE | Admit: 2019-10-24 | Discharge: 2019-10-29 | DRG: 236 | Disposition: A | Discharge status: Home / Self Care | Payer: 59 | Source: Ambulatory Visit | Attending: Cardiothoracic Surgery | Admitting: Cardiothoracic Surgery

## 2019-10-24 ENCOUNTER — Inpatient Hospital Stay (HOSPITAL_COMMUNITY): Payer: 59

## 2019-10-24 ENCOUNTER — Other Ambulatory Visit: Payer: Self-pay

## 2019-10-24 ENCOUNTER — Encounter (HOSPITAL_COMMUNITY)
Admission: RE | Disposition: A | Discharge status: Home / Self Care | Payer: Self-pay | Source: Ambulatory Visit | Attending: Cardiothoracic Surgery

## 2019-10-24 ENCOUNTER — Inpatient Hospital Stay (HOSPITAL_COMMUNITY): Payer: 59 | Admitting: Vascular Surgery

## 2019-10-24 ENCOUNTER — Encounter (HOSPITAL_COMMUNITY): Payer: Self-pay | Admitting: Cardiothoracic Surgery

## 2019-10-24 DIAGNOSIS — Z8249 Family history of ischemic heart disease and other diseases of the circulatory system: Secondary | ICD-10-CM | POA: Diagnosis not present

## 2019-10-24 DIAGNOSIS — I2584 Coronary atherosclerosis due to calcified coronary lesion: Secondary | ICD-10-CM | POA: Diagnosis not present

## 2019-10-24 DIAGNOSIS — E669 Obesity, unspecified: Secondary | ICD-10-CM | POA: Diagnosis present

## 2019-10-24 DIAGNOSIS — Z823 Family history of stroke: Secondary | ICD-10-CM

## 2019-10-24 DIAGNOSIS — G4733 Obstructive sleep apnea (adult) (pediatric): Secondary | ICD-10-CM | POA: Diagnosis not present

## 2019-10-24 DIAGNOSIS — Z09 Encounter for follow-up examination after completed treatment for conditions other than malignant neoplasm: Secondary | ICD-10-CM

## 2019-10-24 DIAGNOSIS — M1990 Unspecified osteoarthritis, unspecified site: Secondary | ICD-10-CM | POA: Diagnosis present

## 2019-10-24 DIAGNOSIS — N289 Disorder of kidney and ureter, unspecified: Secondary | ICD-10-CM | POA: Diagnosis not present

## 2019-10-24 DIAGNOSIS — E8770 Fluid overload, unspecified: Secondary | ICD-10-CM | POA: Diagnosis not present

## 2019-10-24 DIAGNOSIS — K219 Gastro-esophageal reflux disease without esophagitis: Secondary | ICD-10-CM | POA: Diagnosis present

## 2019-10-24 DIAGNOSIS — I25110 Atherosclerotic heart disease of native coronary artery with unstable angina pectoris: Principal | ICD-10-CM | POA: Diagnosis present

## 2019-10-24 DIAGNOSIS — E11319 Type 2 diabetes mellitus with unspecified diabetic retinopathy without macular edema: Secondary | ICD-10-CM | POA: Diagnosis not present

## 2019-10-24 DIAGNOSIS — F419 Anxiety disorder, unspecified: Secondary | ICD-10-CM | POA: Diagnosis present

## 2019-10-24 DIAGNOSIS — F329 Major depressive disorder, single episode, unspecified: Secondary | ICD-10-CM | POA: Diagnosis present

## 2019-10-24 DIAGNOSIS — Z20822 Contact with and (suspected) exposure to covid-19: Secondary | ICD-10-CM | POA: Diagnosis not present

## 2019-10-24 DIAGNOSIS — I454 Nonspecific intraventricular block: Secondary | ICD-10-CM | POA: Diagnosis not present

## 2019-10-24 DIAGNOSIS — Z882 Allergy status to sulfonamides status: Secondary | ICD-10-CM

## 2019-10-24 DIAGNOSIS — D62 Acute posthemorrhagic anemia: Secondary | ICD-10-CM | POA: Diagnosis not present

## 2019-10-24 DIAGNOSIS — I252 Old myocardial infarction: Secondary | ICD-10-CM

## 2019-10-24 DIAGNOSIS — Z951 Presence of aortocoronary bypass graft: Secondary | ICD-10-CM

## 2019-10-24 DIAGNOSIS — E1142 Type 2 diabetes mellitus with diabetic polyneuropathy: Secondary | ICD-10-CM | POA: Diagnosis present

## 2019-10-24 DIAGNOSIS — D696 Thrombocytopenia, unspecified: Secondary | ICD-10-CM | POA: Diagnosis not present

## 2019-10-24 DIAGNOSIS — Z885 Allergy status to narcotic agent status: Secondary | ICD-10-CM | POA: Diagnosis not present

## 2019-10-24 DIAGNOSIS — Z794 Long term (current) use of insulin: Secondary | ICD-10-CM

## 2019-10-24 DIAGNOSIS — R110 Nausea: Secondary | ICD-10-CM | POA: Diagnosis not present

## 2019-10-24 DIAGNOSIS — I10 Essential (primary) hypertension: Secondary | ICD-10-CM | POA: Diagnosis not present

## 2019-10-24 DIAGNOSIS — Z6837 Body mass index (BMI) 37.0-37.9, adult: Secondary | ICD-10-CM | POA: Diagnosis not present

## 2019-10-24 DIAGNOSIS — Z803 Family history of malignant neoplasm of breast: Secondary | ICD-10-CM

## 2019-10-24 DIAGNOSIS — F17290 Nicotine dependence, other tobacco product, uncomplicated: Secondary | ICD-10-CM | POA: Diagnosis present

## 2019-10-24 DIAGNOSIS — I2510 Atherosclerotic heart disease of native coronary artery without angina pectoris: Secondary | ICD-10-CM

## 2019-10-24 DIAGNOSIS — E7800 Pure hypercholesterolemia, unspecified: Secondary | ICD-10-CM | POA: Diagnosis present

## 2019-10-24 DIAGNOSIS — I959 Hypotension, unspecified: Secondary | ICD-10-CM | POA: Diagnosis not present

## 2019-10-24 HISTORY — PX: CORONARY ARTERY BYPASS GRAFT: SHX141

## 2019-10-24 HISTORY — PX: RADIAL ARTERY HARVEST: SHX5067

## 2019-10-24 HISTORY — PX: TEE WITHOUT CARDIOVERSION: SHX5443

## 2019-10-24 LAB — POCT I-STAT 7, (LYTES, BLD GAS, ICA,H+H)
Acid-base deficit: 2 mmol/L (ref 0.0–2.0)
Acid-base deficit: 3 mmol/L — ABNORMAL HIGH (ref 0.0–2.0)
Acid-base deficit: 4 mmol/L — ABNORMAL HIGH (ref 0.0–2.0)
Bicarbonate: 25 mmol/L (ref 20.0–28.0)
Bicarbonate: 22 mmol/L (ref 20.0–28.0)
Bicarbonate: 22.2 mmol/L (ref 20.0–28.0)
Calcium, Ion: 1.13 mmol/L — ABNORMAL LOW (ref 1.15–1.40)
Calcium, Ion: 1.13 mmol/L — ABNORMAL LOW (ref 1.15–1.40)
Calcium, Ion: 1.15 mmol/L (ref 1.15–1.40)
HCT: 30 % — ABNORMAL LOW (ref 36.0–46.0)
HCT: 25 % — ABNORMAL LOW (ref 36.0–46.0)
HCT: 25 % — ABNORMAL LOW (ref 36.0–46.0)
Hemoglobin: 10.2 g/dL — ABNORMAL LOW (ref 12.0–15.0)
Hemoglobin: 8.5 g/dL — ABNORMAL LOW (ref 12.0–15.0)
Hemoglobin: 8.5 g/dL — ABNORMAL LOW (ref 12.0–15.0)
O2 Saturation: 99 %
O2 Saturation: 100 %
O2 Saturation: 98 %
Patient temperature: 35.7
Patient temperature: 37.3
Patient temperature: 37.5
Potassium: 3.9 mmol/L (ref 3.5–5.1)
Potassium: 4.4 mmol/L (ref 3.5–5.1)
Potassium: 4.5 mmol/L (ref 3.5–5.1)
Sodium: 142 mmol/L (ref 135–145)
Sodium: 141 mmol/L (ref 135–145)
Sodium: 140 mmol/L (ref 135–145)
TCO2: 26 mmol/L (ref 22–32)
TCO2: 23 mmol/L (ref 22–32)
TCO2: 24 mmol/L (ref 22–32)
pCO2 arterial: 46.9 mmHg (ref 32.0–48.0)
pCO2 arterial: 39.5 mmHg (ref 32.0–48.0)
pCO2 arterial: 44.7 mmHg (ref 32.0–48.0)
pH, Arterial: 7.328 — ABNORMAL LOW (ref 7.350–7.450)
pH, Arterial: 7.355 (ref 7.350–7.450)
pH, Arterial: 7.307 — ABNORMAL LOW (ref 7.350–7.450)
pO2, Arterial: 136 mmHg — ABNORMAL HIGH (ref 83.0–108.0)
pO2, Arterial: 179 mmHg — ABNORMAL HIGH (ref 83.0–108.0)
pO2, Arterial: 109 mmHg — ABNORMAL HIGH (ref 83.0–108.0)

## 2019-10-24 LAB — BASIC METABOLIC PANEL
Anion gap: 5 (ref 5–15)
BUN: 17 mg/dL (ref 6–20)
CO2: 22 mmol/L (ref 22–32)
Calcium: 7.4 mg/dL — ABNORMAL LOW (ref 8.9–10.3)
Chloride: 111 mmol/L (ref 98–111)
Creatinine, Ser: 0.85 mg/dL (ref 0.44–1.00)
GFR calc Af Amer: >60 mL/min (ref >60)
GFR calc non Af Amer: >60 mL/min (ref >60)
Glucose, Bld: 145 mg/dL — ABNORMAL HIGH (ref 70–99)
Potassium: 4.6 mmol/L (ref 3.5–5.1)
Sodium: 138 mmol/L (ref 135–145)

## 2019-10-24 LAB — CBC
HCT: 31.5 % — ABNORMAL LOW (ref 36.0–46.0)
HCT: 27.4 % — ABNORMAL LOW (ref 36.0–46.0)
Hemoglobin: 10.1 g/dL — ABNORMAL LOW (ref 12.0–15.0)
Hemoglobin: 9 g/dL — ABNORMAL LOW (ref 12.0–15.0)
MCH: 28 pg (ref 26.0–34.0)
MCH: 28.3 pg (ref 26.0–34.0)
MCHC: 32.1 g/dL (ref 30.0–36.0)
MCHC: 32.8 g/dL (ref 30.0–36.0)
MCV: 87.3 fL (ref 80.0–100.0)
MCV: 86.2 fL (ref 80.0–100.0)
Platelets: 127 K/uL — ABNORMAL LOW (ref 150–400)
Platelets: 108 10*3/uL — ABNORMAL LOW (ref 150–400)
RBC: 3.61 MIL/uL — ABNORMAL LOW (ref 3.87–5.11)
RBC: 3.18 MIL/uL — ABNORMAL LOW (ref 3.87–5.11)
RDW: 12.5 % (ref 11.5–15.5)
RDW: 12.7 % (ref 11.5–15.5)
WBC: 10 K/uL (ref 4.0–10.5)
WBC: 7.7 10*3/uL (ref 4.0–10.5)
nRBC: 0 % (ref 0.0–0.2)
nRBC: 0 % (ref 0.0–0.2)

## 2019-10-24 LAB — PLATELET COUNT: Platelets: 156 10*3/uL (ref 150–400)

## 2019-10-24 LAB — HEMOGLOBIN AND HEMATOCRIT, BLOOD
HCT: 25.9 % — ABNORMAL LOW (ref 36.0–46.0)
Hemoglobin: 8.5 g/dL — ABNORMAL LOW (ref 12.0–15.0)

## 2019-10-24 LAB — COOXEMETRY PANEL
Carboxyhemoglobin: 1.3 % (ref 0.5–1.5)
Methemoglobin: 1.2 % (ref 0.0–1.5)
O2 Saturation: 50.2 %
Total hemoglobin: 9.8 g/dL — ABNORMAL LOW (ref 12.0–16.0)

## 2019-10-24 LAB — MAGNESIUM: Magnesium: 2.4 mg/dL (ref 1.7–2.4)

## 2019-10-24 LAB — APTT: aPTT: 26 seconds (ref 24–36)

## 2019-10-24 LAB — ECHO INTRAOPERATIVE TEE
Height: 68 in
Weight: 3668.45 oz

## 2019-10-24 LAB — GLUCOSE, CAPILLARY
Glucose-Capillary: 113 mg/dL — ABNORMAL HIGH (ref 70–99)
Glucose-Capillary: 137 mg/dL — ABNORMAL HIGH (ref 70–99)
Glucose-Capillary: 141 mg/dL — ABNORMAL HIGH (ref 70–99)
Glucose-Capillary: 126 mg/dL — ABNORMAL HIGH (ref 70–99)
Glucose-Capillary: 113 mg/dL — ABNORMAL HIGH (ref 70–99)
Glucose-Capillary: 128 mg/dL — ABNORMAL HIGH (ref 70–99)
Glucose-Capillary: 130 mg/dL — ABNORMAL HIGH (ref 70–99)

## 2019-10-24 LAB — PREPARE RBC (CROSSMATCH)

## 2019-10-24 LAB — PROTIME-INR
INR: 1.4 — ABNORMAL HIGH (ref 0.8–1.2)
Prothrombin Time: 16.8 seconds — ABNORMAL HIGH (ref 11.4–15.2)

## 2019-10-24 SURGERY — CORONARY ARTERY BYPASS GRAFTING (CABG)
Anesthesia: General | Site: Chest

## 2019-10-24 MED ORDER — SODIUM CHLORIDE 0.9 % IV SOLN: INTRAVENOUS | Status: DC

## 2019-10-24 MED ORDER — PHENYLEPHRINE HCL-NACL 10-0.9 MG/250ML-% IV SOLN
INTRAVENOUS | Status: DC | PRN
  Administered 2019-10-24: 50 ug/min via INTRAVENOUS

## 2019-10-24 MED ORDER — FLUOXETINE HCL 20 MG PO CAPS
40.0000 mg | ORAL_CAPSULE | Freq: Every day | ORAL | Status: DC
  Administered 2019-10-25 – 2019-10-29 (×5): 40 mg via ORAL
  Filled 2019-10-24 (×5): qty 2

## 2019-10-24 MED ORDER — BISACODYL 10 MG RE SUPP: 10.0000 mg | Freq: Every day | RECTAL | Status: DC

## 2019-10-24 MED ORDER — SODIUM CHLORIDE 0.9% FLUSH
10.0000 mL | Freq: Two times a day (BID) | INTRAVENOUS | Status: DC
  Administered 2019-10-25 – 2019-10-28 (×7): 10 mL

## 2019-10-24 MED ORDER — SODIUM CHLORIDE 0.9% FLUSH: 10.0000 mL | INTRAVENOUS | Status: DC | PRN

## 2019-10-24 MED ORDER — SODIUM CHLORIDE 0.9 % IV SOLN
1.5000 g | Freq: Two times a day (BID) | INTRAVENOUS | Status: AC
  Administered 2019-10-24 – 2019-10-26 (×4): 1.5 g via INTRAVENOUS
  Filled 2019-10-24 (×4): qty 1.5

## 2019-10-24 MED ORDER — EZETIMIBE 10 MG PO TABS
10.0000 mg | ORAL_TABLET | Freq: Every evening | ORAL | Status: DC
  Administered 2019-10-25 – 2019-10-28 (×4): 10 mg via ORAL
  Filled 2019-10-24 (×4): qty 1

## 2019-10-24 MED ORDER — PROPOFOL 10 MG/ML IV BOLUS
INTRAVENOUS | Status: DC | PRN
  Administered 2019-10-24: 20 mg via INTRAVENOUS
  Administered 2019-10-24: 60 mg via INTRAVENOUS
  Administered 2019-10-24: 20 mg via INTRAVENOUS
  Administered 2019-10-24: 15 mg via INTRAVENOUS

## 2019-10-24 MED ORDER — PHENYLEPHRINE 40 MCG/ML (10ML) SYRINGE FOR IV PUSH (FOR BLOOD PRESSURE SUPPORT)
PREFILLED_SYRINGE | INTRAVENOUS | Status: DC | PRN
  Administered 2019-10-24: 80 ug via INTRAVENOUS
  Administered 2019-10-24: 120 ug via INTRAVENOUS

## 2019-10-24 MED ORDER — LEVALBUTEROL HCL 1.25 MG/0.5ML IN NEBU: 1.2500 mg | INHALATION_SOLUTION | Freq: Four times a day (QID) | RESPIRATORY_TRACT | Status: DC

## 2019-10-24 MED ORDER — DEXMEDETOMIDINE HCL IN NACL 400 MCG/100ML IV SOLN: 0.0000 ug/kg/h | INTRAVENOUS | Status: DC

## 2019-10-24 MED ORDER — SODIUM CHLORIDE 0.45 % IV SOLN: INTRAVENOUS | Status: DC | PRN

## 2019-10-24 MED ORDER — METOPROLOL TARTRATE 12.5 MG HALF TABLET
ORAL_TABLET | ORAL | Status: AC
  Filled 2019-10-24: qty 1

## 2019-10-24 MED ORDER — ACETAMINOPHEN 500 MG PO TABS
1000.0000 mg | ORAL_TABLET | Freq: Four times a day (QID) | ORAL | Status: DC
  Administered 2019-10-25 – 2019-10-29 (×16): 1000 mg via ORAL
  Filled 2019-10-24 (×17): qty 2

## 2019-10-24 MED ORDER — NITROGLYCERIN IN D5W 200-5 MCG/ML-% IV SOLN: 0.0000 ug/min | INTRAVENOUS | Status: DC

## 2019-10-24 MED ORDER — CHLORHEXIDINE GLUCONATE 0.12 % MT SOLN
15.0000 mL | OROMUCOSAL | Status: AC
  Administered 2019-10-24: 15 mL via OROMUCOSAL

## 2019-10-24 MED ORDER — ROCURONIUM BROMIDE 10 MG/ML (PF) SYRINGE
PREFILLED_SYRINGE | INTRAVENOUS | Status: DC | PRN
  Administered 2019-10-24: 50 mg via INTRAVENOUS
  Administered 2019-10-24: 40 mg via INTRAVENOUS
  Administered 2019-10-24: 60 mg via INTRAVENOUS
  Administered 2019-10-24 (×2): 50 mg via INTRAVENOUS
  Administered 2019-10-24: 30 mg via INTRAVENOUS

## 2019-10-24 MED ORDER — MIDAZOLAM HCL 2 MG/2ML IJ SOLN
INTRAMUSCULAR | Status: AC
  Filled 2019-10-24: qty 2

## 2019-10-24 MED ORDER — PROPOFOL 10 MG/ML IV BOLUS
INTRAVENOUS | Status: AC
  Filled 2019-10-24: qty 20

## 2019-10-24 MED ORDER — LACTATED RINGERS IV SOLN: INTRAVENOUS | Status: DC

## 2019-10-24 MED ORDER — TRAMADOL HCL 50 MG PO TABS
50.0000 mg | ORAL_TABLET | ORAL | Status: DC | PRN
  Administered 2019-10-24 – 2019-10-26 (×6): 100 mg via ORAL
  Administered 2019-10-26: 50 mg via ORAL
  Administered 2019-10-26 – 2019-10-29 (×6): 100 mg via ORAL
  Filled 2019-10-24 (×8): qty 2
  Filled 2019-10-24: qty 1
  Filled 2019-10-24 (×3): qty 2
  Filled 2019-10-24 (×2): qty 1

## 2019-10-24 MED ORDER — DOCUSATE SODIUM 100 MG PO CAPS
200.0000 mg | ORAL_CAPSULE | Freq: Every day | ORAL | Status: DC
  Administered 2019-10-25 – 2019-10-27 (×3): 200 mg via ORAL
  Filled 2019-10-24 (×3): qty 2

## 2019-10-24 MED ORDER — FAMOTIDINE IN NACL 20-0.9 MG/50ML-% IV SOLN: 20.0000 mg | Freq: Two times a day (BID) | INTRAVENOUS | Status: DC

## 2019-10-24 MED ORDER — FENTANYL CITRATE (PF) 100 MCG/2ML IJ SOLN
50.0000 ug | INTRAMUSCULAR | Status: DC | PRN
  Administered 2019-10-24: 100 ug via INTRAVENOUS
  Administered 2019-10-25 – 2019-10-26 (×7): 50 ug via INTRAVENOUS
  Filled 2019-10-24 (×9): qty 2

## 2019-10-24 MED ORDER — CHLORHEXIDINE GLUCONATE 0.12 % MT SOLN
OROMUCOSAL | Status: AC
  Filled 2019-10-24: qty 15

## 2019-10-24 MED ORDER — ACETAMINOPHEN 650 MG RE SUPP
650.0000 mg | Freq: Once | RECTAL | Status: AC
  Administered 2019-10-24: 650 mg via RECTAL

## 2019-10-24 MED ORDER — LACTATED RINGERS IV SOLN: INTRAVENOUS | Status: DC | PRN

## 2019-10-24 MED ORDER — DEXTROSE 50 % IV SOLN: 0.0000 mL | INTRAVENOUS | Status: DC | PRN

## 2019-10-24 MED ORDER — VANCOMYCIN HCL IN DEXTROSE 1-5 GM/200ML-% IV SOLN: 1000.0000 mg | Freq: Once | INTRAVENOUS | Status: DC

## 2019-10-24 MED ORDER — PHENYLEPHRINE HCL-NACL 20-0.9 MG/250ML-% IV SOLN
0.0000 ug/min | INTRAVENOUS | Status: DC
  Administered 2019-10-25: 13 ug/min via INTRAVENOUS
  Filled 2019-10-24: qty 250

## 2019-10-24 MED ORDER — FENTANYL CITRATE (PF) 250 MCG/5ML IJ SOLN
INTRAMUSCULAR | Status: DC | PRN
  Administered 2019-10-24: 150 ug via INTRAVENOUS
  Administered 2019-10-24 (×2): 100 ug via INTRAVENOUS
  Administered 2019-10-24: 50 ug via INTRAVENOUS
  Administered 2019-10-24 (×2): 150 ug via INTRAVENOUS
  Administered 2019-10-24 (×2): 50 ug via INTRAVENOUS
  Administered 2019-10-24: 100 ug via INTRAVENOUS
  Administered 2019-10-24: 150 ug via INTRAVENOUS
  Administered 2019-10-24 (×2): 100 ug via INTRAVENOUS

## 2019-10-24 MED ORDER — ORAL CARE MOUTH RINSE
15.0000 mL | Freq: Two times a day (BID) | OROMUCOSAL | Status: DC
  Administered 2019-10-25 – 2019-10-27 (×5): 15 mL via OROMUCOSAL

## 2019-10-24 MED ORDER — SODIUM CHLORIDE 0.9% FLUSH: 3.0000 mL | INTRAVENOUS | Status: DC | PRN

## 2019-10-24 MED ORDER — PANTOPRAZOLE SODIUM 40 MG PO TBEC
40.0000 mg | DELAYED_RELEASE_TABLET | Freq: Every day | ORAL | Status: DC
  Administered 2019-10-26 – 2019-10-29 (×4): 40 mg via ORAL
  Filled 2019-10-24 (×4): qty 1

## 2019-10-24 MED ORDER — CHLORHEXIDINE GLUCONATE 0.12% ORAL RINSE (MEDLINE KIT)
15.0000 mL | Freq: Two times a day (BID) | OROMUCOSAL | Status: DC
  Administered 2019-10-24: 15 mL via OROMUCOSAL

## 2019-10-24 MED ORDER — ONDANSETRON HCL 4 MG/2ML IJ SOLN
4.0000 mg | Freq: Four times a day (QID) | INTRAMUSCULAR | Status: DC | PRN
  Administered 2019-10-24 – 2019-10-27 (×8): 4 mg via INTRAVENOUS
  Filled 2019-10-24 (×8): qty 2

## 2019-10-24 MED ORDER — CHLORHEXIDINE GLUCONATE 0.12 % MT SOLN
15.0000 mL | Freq: Once | OROMUCOSAL | Status: AC
  Administered 2019-10-24: 15 mL via OROMUCOSAL

## 2019-10-24 MED ORDER — ACETAMINOPHEN 160 MG/5ML PO SOLN: 650.0000 mg | Freq: Once | ORAL | Status: AC

## 2019-10-24 MED ORDER — TRANEXAMIC ACID 1000 MG/10ML IV SOLN
1.5000 mg/kg/h | INTRAVENOUS | Status: DC
  Filled 2019-10-24: qty 25

## 2019-10-24 MED ORDER — LEVALBUTEROL HCL 1.25 MG/0.5ML IN NEBU
1.2500 mg | INHALATION_SOLUTION | Freq: Four times a day (QID) | RESPIRATORY_TRACT | Status: DC
  Administered 2019-10-24: 1.25 mg via RESPIRATORY_TRACT
  Filled 2019-10-24 (×2): qty 0.5

## 2019-10-24 MED ORDER — METOPROLOL TARTRATE 5 MG/5ML IV SOLN: 2.5000 mg | INTRAVENOUS | Status: DC | PRN

## 2019-10-24 MED ORDER — ATORVASTATIN CALCIUM 80 MG PO TABS
80.0000 mg | ORAL_TABLET | Freq: Every day | ORAL | Status: DC
  Administered 2019-10-25 – 2019-10-28 (×4): 80 mg via ORAL
  Filled 2019-10-24 (×4): qty 1

## 2019-10-24 MED ORDER — ALBUMIN HUMAN 5 % IV SOLN: INTRAVENOUS | Status: DC | PRN

## 2019-10-24 MED ORDER — METOPROLOL TARTRATE 25 MG/10 ML ORAL SUSPENSION: 12.5000 mg | Freq: Two times a day (BID) | ORAL | Status: DC

## 2019-10-24 MED ORDER — DOBUTAMINE IN D5W 4-5 MG/ML-% IV SOLN
2.5000 ug/kg/min | INTRAVENOUS | Status: DC
  Administered 2019-10-24: 2.5 ug/kg/min via INTRAVENOUS
  Filled 2019-10-24: qty 250

## 2019-10-24 MED ORDER — ORAL CARE MOUTH RINSE: 15.0000 mL | OROMUCOSAL | Status: DC

## 2019-10-24 MED ORDER — VANCOMYCIN HCL IN DEXTROSE 1-5 GM/200ML-% IV SOLN
1000.0000 mg | Freq: Two times a day (BID) | INTRAVENOUS | Status: AC
  Administered 2019-10-24 – 2019-10-25 (×2): 1000 mg via INTRAVENOUS
  Filled 2019-10-24 (×2): qty 200

## 2019-10-24 MED ORDER — SODIUM CHLORIDE 0.9% FLUSH
3.0000 mL | Freq: Two times a day (BID) | INTRAVENOUS | Status: DC
  Administered 2019-10-25 – 2019-10-28 (×5): 3 mL via INTRAVENOUS

## 2019-10-24 MED ORDER — FLUTICASONE PROPIONATE 50 MCG/ACT NA SUSP
2.0000 | Freq: Every day | NASAL | Status: DC | PRN
  Filled 2019-10-24: qty 16

## 2019-10-24 MED ORDER — MILRINONE LACTATE IN DEXTROSE 20-5 MG/100ML-% IV SOLN: 0.2500 ug/kg/min | INTRAVENOUS | Status: DC

## 2019-10-24 MED ORDER — METOPROLOL TARTRATE 12.5 MG HALF TABLET: 12.5000 mg | ORAL_TABLET | Freq: Two times a day (BID) | ORAL | Status: DC

## 2019-10-24 MED ORDER — DEXTROSE 50 % IV SOLN
INTRAVENOUS | Status: DC | PRN
  Administered 2019-10-24: .5 via INTRAVENOUS

## 2019-10-24 MED ORDER — FENTANYL CITRATE (PF) 250 MCG/5ML IJ SOLN
INTRAMUSCULAR | Status: AC
  Filled 2019-10-24: qty 5

## 2019-10-24 MED ORDER — FENTANYL CITRATE (PF) 250 MCG/5ML IJ SOLN
INTRAMUSCULAR | Status: AC
  Filled 2019-10-24: qty 20

## 2019-10-24 MED ORDER — MIDAZOLAM HCL 5 MG/5ML IJ SOLN
INTRAMUSCULAR | Status: DC | PRN
  Administered 2019-10-24 (×5): 2 mg via INTRAVENOUS
  Administered 2019-10-24 (×2): 1 mg via INTRAVENOUS

## 2019-10-24 MED ORDER — SODIUM CHLORIDE 0.9 % IV SOLN: 250.0000 mL | INTRAVENOUS | Status: DC

## 2019-10-24 MED ORDER — BISACODYL 5 MG PO TBEC
10.0000 mg | DELAYED_RELEASE_TABLET | Freq: Every day | ORAL | Status: DC
  Administered 2019-10-25 – 2019-10-28 (×4): 10 mg via ORAL
  Filled 2019-10-24 (×4): qty 2

## 2019-10-24 MED ORDER — CHLORHEXIDINE GLUCONATE 4 % EX LIQD: 30.0000 mL | CUTANEOUS | Status: DC

## 2019-10-24 MED ORDER — MAGNESIUM SULFATE 4 GM/100ML IV SOLN
4.0000 g | Freq: Once | INTRAVENOUS | Status: AC
  Administered 2019-10-24: 4 g via INTRAVENOUS
  Filled 2019-10-24: qty 100

## 2019-10-24 MED ORDER — INSULIN REGULAR(HUMAN) IN NACL 100-0.9 UT/100ML-% IV SOLN: INTRAVENOUS | Status: DC

## 2019-10-24 MED ORDER — ASPIRIN 81 MG PO CHEW: 324.0000 mg | CHEWABLE_TABLET | Freq: Every day | ORAL | Status: DC

## 2019-10-24 MED ORDER — HEPARIN SODIUM (PORCINE) 1000 UNIT/ML IJ SOLN
INTRAMUSCULAR | Status: DC | PRN
  Administered 2019-10-24: 2000 [IU] via INTRAVENOUS
  Administered 2019-10-24: 35000 [IU] via INTRAVENOUS

## 2019-10-24 MED ORDER — LACTATED RINGERS IV SOLN: 500.0000 mL | Freq: Once | INTRAVENOUS | Status: DC | PRN

## 2019-10-24 MED ORDER — ALBUMIN HUMAN 5 % IV SOLN
250.0000 mL | INTRAVENOUS | Status: AC | PRN
  Administered 2019-10-24 – 2019-10-25 (×6): 12.5 g via INTRAVENOUS
  Filled 2019-10-24 (×2): qty 250
  Filled 2019-10-24: qty 500

## 2019-10-24 MED ORDER — ASPIRIN EC 325 MG PO TBEC
325.0000 mg | DELAYED_RELEASE_TABLET | Freq: Every day | ORAL | Status: DC
  Administered 2019-10-25 – 2019-10-29 (×5): 325 mg via ORAL
  Filled 2019-10-24 (×5): qty 1

## 2019-10-24 MED ORDER — CHLORHEXIDINE GLUCONATE CLOTH 2 % EX PADS
6.0000 | MEDICATED_PAD | Freq: Every day | CUTANEOUS | Status: DC
  Administered 2019-10-25 – 2019-10-27 (×3): 6 via TOPICAL

## 2019-10-24 MED ORDER — 0.9 % SODIUM CHLORIDE (POUR BTL) OPTIME
TOPICAL | Status: DC | PRN
  Administered 2019-10-24: 5000 mL

## 2019-10-24 MED ORDER — METOPROLOL TARTRATE 12.5 MG HALF TABLET: 12.5000 mg | ORAL_TABLET | Freq: Once | ORAL | Status: DC

## 2019-10-24 MED ORDER — SODIUM CHLORIDE (PF) 0.9 % IJ SOLN
OROMUCOSAL | Status: DC | PRN
  Administered 2019-10-24 (×3): 4 mL via TOPICAL

## 2019-10-24 MED ORDER — HEMOSTATIC AGENTS (NO CHARGE) OPTIME
TOPICAL | Status: DC | PRN
  Administered 2019-10-24 (×3): 1 via TOPICAL

## 2019-10-24 MED ORDER — SODIUM CHLORIDE 0.9% IV SOLUTION: Freq: Once | INTRAVENOUS | Status: DC

## 2019-10-24 MED ORDER — MIDAZOLAM HCL 2 MG/2ML IJ SOLN: 2.0000 mg | INTRAMUSCULAR | Status: DC | PRN

## 2019-10-24 MED ORDER — PROPOFOL 1000 MG/100ML IV EMUL
INTRAVENOUS | Status: AC
  Filled 2019-10-24: qty 100

## 2019-10-24 MED ORDER — POTASSIUM CHLORIDE 10 MEQ/50ML IV SOLN
10.0000 meq | INTRAVENOUS | Status: AC
  Administered 2019-10-24 (×3): 10 meq via INTRAVENOUS

## 2019-10-24 MED ORDER — LIDOCAINE 2% (20 MG/ML) 5 ML SYRINGE
INTRAMUSCULAR | Status: DC | PRN
  Administered 2019-10-24: 40 mg via INTRAVENOUS

## 2019-10-24 MED ORDER — ACETAMINOPHEN 160 MG/5ML PO SOLN: 1000.0000 mg | Freq: Four times a day (QID) | ORAL | Status: DC

## 2019-10-24 MED ORDER — SODIUM CHLORIDE 0.9 % IV SOLN
20.0000 ug | Freq: Once | INTRAVENOUS | Status: AC
  Administered 2019-10-24: 20 ug via INTRAVENOUS
  Filled 2019-10-24 (×2): qty 5

## 2019-10-24 MED ORDER — NOREPINEPHRINE 4 MG/250ML-% IV SOLN
4.0000 ug/min | INTRAVENOUS | Status: DC
  Administered 2019-10-25: 4 ug/min via INTRAVENOUS
  Filled 2019-10-24 (×2): qty 250

## 2019-10-24 MED ORDER — PROTAMINE SULFATE 10 MG/ML IV SOLN
INTRAVENOUS | Status: DC | PRN
  Administered 2019-10-24: 370 mg via INTRAVENOUS

## 2019-10-24 MED ORDER — MIDAZOLAM HCL (PF) 10 MG/2ML IJ SOLN
INTRAMUSCULAR | Status: AC
  Filled 2019-10-24: qty 2

## 2019-10-24 SURGICAL SUPPLY — 113 items
ADAPTER CARDIO PERF ANTE/RETRO (ADAPTER) ×5 IMPLANT
ADH SKN CLS APL DERMABOND .7 (GAUZE/BANDAGES/DRESSINGS) ×6
ADPR PRFSN 84XANTGRD RTRGD (ADAPTER) ×3
AGENT HMST KT MTR STRL THRMB (HEMOSTASIS) ×3
APPLIER CLIP 9.375 SM OPEN (CLIP) ×5
APR CLP SM 9.3 20 MLT OPN (CLIP) ×3
BAG DECANTER FOR FLEXI CONT (MISCELLANEOUS) ×5 IMPLANT
BASKET HEART  (ORDER IN 25'S) (MISCELLANEOUS) ×1
BASKET HEART (ORDER IN 25'S) (MISCELLANEOUS) ×1
BASKET HEART (ORDER IN 25S) (MISCELLANEOUS) ×3 IMPLANT
BLADE CLIPPER SURG (BLADE) ×3 IMPLANT
BLADE MINI RND TIP GREEN BEAV (BLADE) ×2 IMPLANT
BLADE STERNUM SYSTEM 6 (BLADE) ×5 IMPLANT
BLADE SURG 12 STRL SS (BLADE) ×5 IMPLANT
BNDG ELASTIC 4X5.8 VLCR STR LF (GAUZE/BANDAGES/DRESSINGS) ×7 IMPLANT
BNDG ELASTIC 6X5.8 VLCR STR LF (GAUZE/BANDAGES/DRESSINGS) ×5 IMPLANT
BNDG GAUZE ELAST 4 BULKY (GAUZE/BANDAGES/DRESSINGS) ×7 IMPLANT
CANISTER SUCT 3000ML PPV (MISCELLANEOUS) ×5 IMPLANT
CANNULA GUNDRY RCSP 15FR (MISCELLANEOUS) ×5 IMPLANT
CATH CPB KIT VANTRIGT (MISCELLANEOUS) ×5 IMPLANT
CATH ROBINSON RED A/P 18FR (CATHETERS) ×15 IMPLANT
CATH THORACIC 36FR RT ANG (CATHETERS) ×5 IMPLANT
CLIP APPLIE 9.375 SM OPEN (CLIP) IMPLANT
CLIP FOGARTY SPRING 6M (CLIP) ×2 IMPLANT
CLIP VESOCCLUDE SM WIDE 24/CT (CLIP) ×4 IMPLANT
DERMABOND ADVANCED (GAUZE/BANDAGES/DRESSINGS) ×4
DERMABOND ADVANCED .7 DNX12 (GAUZE/BANDAGES/DRESSINGS) IMPLANT
DRAIN CHANNEL 32F RND 10.7 FF (WOUND CARE) ×5 IMPLANT
DRAPE CARDIOVASCULAR INCISE (DRAPES) ×5
DRAPE SLUSH/WARMER DISC (DRAPES) ×5 IMPLANT
DRAPE SRG 135X102X78XABS (DRAPES) ×3 IMPLANT
DRSG AQUACEL AG ADV 3.5X14 (GAUZE/BANDAGES/DRESSINGS) ×5 IMPLANT
ELECT BLADE 4.0 EZ CLEAN MEGAD (MISCELLANEOUS) ×5
ELECT BLADE 6.5 EXT (BLADE) ×5 IMPLANT
ELECT CAUTERY BLADE 6.4 (BLADE) ×5 IMPLANT
ELECT REM PT RETURN 9FT ADLT (ELECTROSURGICAL) ×10
ELECTRODE BLDE 4.0 EZ CLN MEGD (MISCELLANEOUS) ×3 IMPLANT
ELECTRODE REM PT RTRN 9FT ADLT (ELECTROSURGICAL) ×6 IMPLANT
FELT TEFLON 1X6 (MISCELLANEOUS) ×8 IMPLANT
GAUZE SPONGE 4X4 12PLY STRL (GAUZE/BANDAGES/DRESSINGS) ×12 IMPLANT
GLOVE BIO SURGEON STRL SZ 6 (GLOVE) ×2 IMPLANT
GLOVE BIO SURGEON STRL SZ 6.5 (GLOVE) ×2 IMPLANT
GLOVE BIO SURGEON STRL SZ7.5 (GLOVE) ×21 IMPLANT
GLOVE BIO SURGEONS STRL SZ 6.5 (GLOVE) ×2
GLOVE BIOGEL PI IND STRL 6 (GLOVE) IMPLANT
GLOVE BIOGEL PI IND STRL 7.5 (GLOVE) IMPLANT
GLOVE BIOGEL PI INDICATOR 6 (GLOVE) ×10
GLOVE BIOGEL PI INDICATOR 7.5 (GLOVE) ×6
GOWN STRL REUS W/ TWL LRG LVL3 (GOWN DISPOSABLE) ×12 IMPLANT
GOWN STRL REUS W/ TWL XL LVL3 (GOWN DISPOSABLE) IMPLANT
GOWN STRL REUS W/TWL LRG LVL3 (GOWN DISPOSABLE) ×55
GOWN STRL REUS W/TWL XL LVL3 (GOWN DISPOSABLE) ×5
HEMOSTAT POWDER SURGIFOAM 1G (HEMOSTASIS) ×15 IMPLANT
HEMOSTAT SURGICEL 2X14 (HEMOSTASIS) ×5 IMPLANT
INSERT FOGARTY XLG (MISCELLANEOUS) IMPLANT
KIT BASIN OR (CUSTOM PROCEDURE TRAY) ×5 IMPLANT
KIT SUCTION CATH 14FR (SUCTIONS) ×5 IMPLANT
KIT TURNOVER KIT B (KITS) ×5 IMPLANT
KIT VASOVIEW ACCESSORY VH 2004 (KITS) ×2 IMPLANT
KIT VASOVIEW HEMOPRO 2 VH 4000 (KITS) ×5 IMPLANT
LEAD PACING MYOCARDI (MISCELLANEOUS) ×5 IMPLANT
MARKER GRAFT CORONARY BYPASS (MISCELLANEOUS) ×15 IMPLANT
MARKER SKIN DUAL TIP RULER LAB (MISCELLANEOUS) ×2 IMPLANT
NS IRRIG 1000ML POUR BTL (IV SOLUTION) ×25 IMPLANT
PACK E OPEN HEART (SUTURE) ×5 IMPLANT
PACK OPEN HEART (CUSTOM PROCEDURE TRAY) ×5 IMPLANT
PAD ARMBOARD 7.5X6 YLW CONV (MISCELLANEOUS) ×10 IMPLANT
PAD ELECT DEFIB RADIOL ZOLL (MISCELLANEOUS) ×5 IMPLANT
PENCIL BUTTON HOLSTER BLD 10FT (ELECTRODE) ×5 IMPLANT
POSITIONER HEAD DONUT 9IN (MISCELLANEOUS) ×5 IMPLANT
POWDER SURGICEL 3.0 GRAM (HEMOSTASIS) ×2 IMPLANT
PUNCH AORTIC ROTATE 4.0MM (MISCELLANEOUS) IMPLANT
PUNCH AORTIC ROTATE 4.5MM 8IN (MISCELLANEOUS) ×2 IMPLANT
PUNCH AORTIC ROTATE 5MM 8IN (MISCELLANEOUS) IMPLANT
SET CARDIOPLEGIA MPS 5001102 (MISCELLANEOUS) ×2 IMPLANT
SLEEVE SURGICAL STRL (SLEEVE) ×2 IMPLANT
SPONGE LAP 18X18 X RAY DECT (DISPOSABLE) ×6 IMPLANT
SPONGE LAP 4X18 RFD (DISPOSABLE) ×6 IMPLANT
SURGIFLO W/THROMBIN 8M KIT (HEMOSTASIS) ×5 IMPLANT
SUT BONE WAX W31G (SUTURE) ×5 IMPLANT
SUT MNCRL AB 4-0 PS2 18 (SUTURE) IMPLANT
SUT PROLENE 3 0 SH DA (SUTURE) ×6 IMPLANT
SUT PROLENE 3 0 SH1 36 (SUTURE) IMPLANT
SUT PROLENE 4 0 RB 1 (SUTURE) ×5
SUT PROLENE 4 0 SH DA (SUTURE) ×5 IMPLANT
SUT PROLENE 4-0 RB1 .5 CRCL 36 (SUTURE) ×3 IMPLANT
SUT PROLENE 5 0 C 1 36 (SUTURE) IMPLANT
SUT PROLENE 6 0 C 1 30 (SUTURE) ×12 IMPLANT
SUT PROLENE 6 0 CC (SUTURE) ×27 IMPLANT
SUT PROLENE 8 0 BV175 6 (SUTURE) ×4 IMPLANT
SUT PROLENE BLUE 7 0 (SUTURE) ×5 IMPLANT
SUT PROLENE POLY MONO (SUTURE) ×6 IMPLANT
SUT SILK  1 MH (SUTURE)
SUT SILK 1 MH (SUTURE) IMPLANT
SUT SILK 2 0 SH CR/8 (SUTURE) ×2 IMPLANT
SUT SILK 3 0 SH CR/8 (SUTURE) IMPLANT
SUT STEEL 6MS V (SUTURE) ×8 IMPLANT
SUT STEEL SZ 6 DBL 3X14 BALL (SUTURE) ×7 IMPLANT
SUT VIC AB 1 CTX 36 (SUTURE) ×10
SUT VIC AB 1 CTX36XBRD ANBCTR (SUTURE) ×6 IMPLANT
SUT VIC AB 2-0 CT1 27 (SUTURE) ×10
SUT VIC AB 2-0 CT1 TAPERPNT 27 (SUTURE) IMPLANT
SUT VIC AB 2-0 CTX 27 (SUTURE) IMPLANT
SUT VIC AB 3-0 X1 27 (SUTURE) ×4 IMPLANT
SYSTEM SAHARA CHEST DRAIN ATS (WOUND CARE) ×5 IMPLANT
TAPE CLOTH SURG 4X10 WHT LF (GAUZE/BANDAGES/DRESSINGS) ×2 IMPLANT
TAPE PAPER 2X10 WHT MICROPORE (GAUZE/BANDAGES/DRESSINGS) ×2 IMPLANT
TOWEL GREEN STERILE (TOWEL DISPOSABLE) ×5 IMPLANT
TOWEL GREEN STERILE FF (TOWEL DISPOSABLE) ×5 IMPLANT
TRAY FOLEY SLVR 16FR TEMP STAT (SET/KITS/TRAYS/PACK) ×5 IMPLANT
TUBING LAP HI FLOW INSUFFLATIO (TUBING) ×5 IMPLANT
UNDERPAD 30X30 (UNDERPADS AND DIAPERS) ×5 IMPLANT
WATER STERILE IRR 1000ML POUR (IV SOLUTION) ×10 IMPLANT

## 2019-10-24 NOTE — Progress Notes (Signed)
  Echocardiogram Echocardiogram Transesophageal has been performed.  Celene Skeen 10/24/2019, 8:46 AM

## 2019-10-24 NOTE — Anesthesia Preprocedure Evaluation (Signed)
Anesthesia Evaluation  Patient identified by MRN, date of birth, ID band Patient awake    Reviewed: Allergy & Precautions, NPO status , Patient's Chart, lab work & pertinent test results  History of Anesthesia Complications (+) PONV and history of anesthetic complications  Airway Mallampati: I  TM Distance: >3 FB Neck ROM: Full    Dental  (+) Teeth Intact, Dental Advisory Given   Pulmonary sleep apnea , Patient abstained from smoking., former smoker,    breath sounds clear to auscultation       Cardiovascular hypertension, Pt. on home beta blockers + CAD   Rhythm:Regular Rate:Normal     Neuro/Psych PSYCHIATRIC DISORDERS Anxiety Depression negative neurological ROS     GI/Hepatic Neg liver ROS, GERD  Medicated,  Endo/Other  diabetes, Type 2, Insulin Dependent, Oral Hypoglycemic Agents  Renal/GU Renal disease     Musculoskeletal  (+) Arthritis ,   Abdominal Normal abdominal exam  (+)   Peds  Hematology negative hematology ROS (+)   Anesthesia Other Findings   Reproductive/Obstetrics                             Anesthesia Physical Anesthesia Plan  ASA: IV  Anesthesia Plan: General   Post-op Pain Management:    Induction: Intravenous  PONV Risk Score and Plan: 4 or greater and Ondansetron, Dexamethasone, Midazolam and Scopolamine patch - Pre-op  Airway Management Planned: Oral ETT  Additional Equipment: Arterial line, CVP, PA Cath, TEE and Ultrasound Guidance Line Placement  Intra-op Plan:   Post-operative Plan: Extubation in OR  Informed Consent: I have reviewed the patients History and Physical, chart, labs and discussed the procedure including the risks, benefits and alternatives for the proposed anesthesia with the patient or authorized representative who has indicated his/her understanding and acceptance.     Dental advisory given  Plan Discussed with:  CRNA  Anesthesia Plan Comments:         Anesthesia Quick Evaluation

## 2019-10-24 NOTE — Anesthesia Procedure Notes (Addendum)
Procedure Name: Intubation Date/Time: 10/24/2019 8:05 AM Performed by: Amadeo Garnet, CRNA Pre-anesthesia Checklist: Patient identified, Emergency Drugs available, Suction available and Patient being monitored Patient Re-evaluated:Patient Re-evaluated prior to induction Oxygen Delivery Method: Circle system utilized Preoxygenation: Pre-oxygenation with 100% oxygen Induction Type: IV induction Ventilation: Mask ventilation without difficulty Laryngoscope Size: Mac and 3 Grade View: Grade I Tube type: Oral Tube size: 8.0 mm Number of attempts: 1 Airway Equipment and Method: Stylet Placement Confirmation: ETT inserted through vocal cords under direct vision,  positive ETCO2 and breath sounds checked- equal and bilateral Secured at: 22 cm Tube secured with: Tape Dental Injury: Teeth and Oropharynx as per pre-operative assessment

## 2019-10-24 NOTE — Transfer of Care (Signed)
Immediate Anesthesia Transfer of Care Note  Patient: MARILIN KOFMAN  Procedure(s) Performed: CORONARY ARTERY BYPASS GRAFTING (CABG) times four, using left radial artery harvested endoscopically and right greater saphenous vein harvested endoscopically. (N/A Chest) TRANSESOPHAGEAL ECHOCARDIOGRAM (TEE) (N/A ) Radial Artery Harvest (Left Arm Lower)  Patient Location: PACU  Anesthesia Type:General  Level of Consciousness: Patient remains intubated per anesthesia plan  Airway & Oxygen Therapy: Patient remains intubated per anesthesia plan and Patient placed on Ventilator (see vital sign flow sheet for setting)  Post-op Assessment: Report given to RN and Post -op Vital signs reviewed and stable  Post vital signs: Reviewed and stable  Last Vitals:  Vitals Value Taken Time  BP    Temp 35.7 C 10/24/19 1608  Pulse 76 10/24/19 1608  Resp 12 10/24/19 1608  SpO2 100 % 10/24/19 1608  Vitals shown include unvalidated device data.  Last Pain:  Vitals:   10/24/19 0601  PainSc: 0-No pain         Complications: No apparent anesthesia complications

## 2019-10-24 NOTE — Progress Notes (Addendum)
RN communicated ready to wean @2000 . RT in bedside procedure, will come to wean when available.

## 2019-10-24 NOTE — Procedures (Signed)
Extubation Procedure Note  Patient Details:   Name: [name] DOB: Oct 08, 1970 MRN: [id number]   Airway Documentation:  Airway 8 mm (Active)  Secured at (cm) 22 cm 10/24/19 1556  Measured From Lips 10/24/19 1556  Secured Location Right 10/24/19 1556  Secured By Pink Tape 10/24/19 1556  Site Condition Dry 10/24/19 1556     Airway 8 mm (Active)  Secured at (cm) 22 cm 10/24/19 1933  Measured From Lips 10/24/19 1933  Secured Location Right 10/24/19 1933  Secured By Pink Tape 10/24/19 1933  Site Condition Dry 10/24/19 1933   Vent end date: 10/24/19 Vent end time: 2129   Evaluation  O2 sats: stable throughout Complications: No apparent complications Patient did not tolerate procedure well. Bilateral Breath Sounds: Clear, Diminished   Yes  Pt extubated to 4 lpm n/c.  Pt was able to verbalize her name and she was oriented to time and place.  RN is teaching incentive spirometer.     [name] [facility] 10/24/2019, 9:32 PM

## 2019-10-24 NOTE — Anesthesia Procedure Notes (Signed)
Central Venous Catheter Insertion Performed by: Shelton Silvas, MD, anesthesiologist Start/End2/23/2021 7:15 AM, 10/24/2019 7:18 AM Patient location: Pre-op. Preanesthetic checklist: patient identified, IV checked, site marked, risks and benefits discussed, surgical consent, monitors and equipment checked, pre-op evaluation, timeout performed and anesthesia consent Hand hygiene performed  and maximum sterile barriers used  PA cath was placed.Swan type:thermodilution Procedure performed without using ultrasound guided technique. Attempts: 1 Patient tolerated the procedure well with no immediate complications.

## 2019-10-24 NOTE — H&P (Signed)
PCP is Marcine Matar, MD Referring Provider is No ref. provider found  No chief complaint on file.  Patient examined, images of coronary angiogram and echocardiogram and CTA of chest personally reviewed and counseled with patient  HP 71-year-year-old obese poorly controlled diabetic ex-smoker with recent diagnosis of three-vessel CAD.  Because of coronary anatomy she was not felt to be a candidate for PCI.  She underwent a evidence-based trial of aggressive medical therapy without improvement in her daily chest pain.  She has been compliant with her medications and diet.  She now presents for information regarding coronary bypass grafting for treatment of her severe multivessel coronary artery disease.  Cardiac cath showed normal LV EF, normal LVEDP.  No significant valve disease on echocardiogram.  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  She had necrosing fasciitis of her right inner thigh 2 years ago which required debridement wound VAC and healed secondarily.  Her father and sister have had CABG. Patient has had previous cholecystectomy appendectomy without anesthesia problems or bleeding problems She stopped smoking in 2017. The patient had COVID-19 viral infection in January 2020 but was not hospitalized. The patient is right-hand dominant. She takes Levemir and Metformin for diabetic control. Past Medical History:  Diagnosis Date  . Anxiety   . Arthritis    fingers  . Cervical dysplasia   . Coronary artery disease   . Depression   . Diabetes mellitus    Type II  . Dyspnea 11/22/2013  . GERD (gastroesophageal reflux disease)   . High cholesterol   . History of kidney stones    passed  . Hypertension    no meds now  . Kidney stones   . Neuropathy    feet and legs  . PONV (postoperative nausea and vomiting)   . Sleep apnea    lost 70lbs no cpap x45yrs now     Past Surgical History:  Procedure Laterality Date  . CARDIAC CATHETERIZATION     4-63yrs ago  . CERVICAL CONIZATION W/BX N/A 05/04/2018   Procedure: CONIZATION CERVIX WITH BIOPSY - COLD KNIFE;  Surgeon: Hermina Staggers, MD;  Location: [facility];  Service: Gynecology;  Laterality: N/A;  . CHOLECYSTECTOMY    . ENDOMETRIAL ABLATION     6 years ago  . INCISION AND DRAINAGE     for boil- buttocks  . INCISION AND DRAINAGE Left    Leg, Nephrotoxin fasciotimy  . IRRIGATION AND DEBRIDEMENT ABSCESS Right 03/17/2017   Procedure: IRRIGATION AND DEBRIDEMENT RIGHT THIGH ABSCESS;  Surgeon: Karie Soda, MD;  Location: [facility];  Service: General;  Laterality: Right;  . LAPAROSCOPIC APPENDECTOMY  10/04/2011   Procedure: APPENDECTOMY LAPAROSCOPIC;  Surgeon: Rulon Abide, DO;  Location: [facility];  Service: General;  Laterality: N/A;  . LEFT HEART CATH AND CORONARY ANGIOGRAPHY N/A 10/06/2019   Procedure: LEFT HEART CATH AND CORONARY ANGIOGRAPHY;  Surgeon: [place], Mendell Bontempo M, MD;  Location: [facility] IN[facility] CV [facility];  Service: Cardiovascular;  Laterality: N/A;    Family History  Problem Relation Age of Onset  . Aneurysm Mother   . Heart attack Father   . Stroke Father   . Breast cancer Paternal Grandmother     Social History Social History   Tobacco Use  . Smoking status: Former Smoker    Packs/day: 0.00    Years: 24.00    Pack years: 0.00  Types: E-cigarettes  . Smokeless tobacco: Current User  . Tobacco comment: vape  Substance Use Topics  . Alcohol use: Yes    Comment: rare  . Drug use: No    Current Facility-Administered Medications  Medication Dose Route Frequency Provider Last Rate Last Admin  . cefUROXime (ZINACEF) 1.5 g in sodium chloride 0.9 % 100 mL IVPB  1.5 g Intravenous To OR Donata Clay, Theron Arista, MD      . cefUROXime (ZINACEF) 750 mg in sodium chloride 0.9 % 100 mL IVPB  750 mg Intravenous To OR Donata Clay, Theron Arista, MD      . chlorhexidine (HIBICLENS) 4 % liquid 2  application  30 mL Topical UD Donata Clay, Theron Arista, MD      . dexmedetomidine (PRECEDEX) 400 MCG/100ML (4 mcg/mL) infusion  0.1-0.7 mcg/kg/hr Intravenous To OR Donata Clay, Theron Arista, MD      . EPINEPHrine (ADRENALIN) 4 mg in NS 250 mL (0.016 mg/mL) premix infusion  0-10 mcg/min Intravenous To OR Donata Clay, Theron Arista, MD      . heparin 2,500 Units, papaverine 30 mg in electrolyte-148 (PLASMALYTE-148) 500 mL irrigation   Irrigation To OR Donata Clay, Theron Arista, MD      . heparin 30,000 units/NS 1000 mL solution for CELLSAVER   Other To OR Donata Clay, Theron Arista, MD      . insulin regular, human (MYXREDLIN) 100 units/ 100 mL infusion   Intravenous To OR Donata Clay, Theron Arista, MD      . magnesium sulfate (IV Push/IM) injection 40 mEq  40 mEq Other To OR Donata Clay, Theron Arista, MD      . metoprolol tartrate (LOPRESSOR) tablet 12.5 mg  12.5 mg Oral Once Donata Clay, Theron Arista, MD      . milrinone (PRIMACOR) 20 MG/100 ML (0.2 mg/mL) infusion  0.3 mcg/kg/min Intravenous To OR Donata Clay, Theron Arista, MD      . nitroGLYCERIN 50 mg in dextrose 5 % 250 mL (0.2 mg/mL) infusion  2-200 mcg/min Intravenous To OR Donata Clay, Theron Arista, MD      . norepinephrine (LEVOPHED) 4mg  in premix infusion  0-40 mcg/min Intravenous To OR , Donata Clay, MD      . phenylephrine (NEOSYNEPHRINE) 20-0.9 MG/250ML-% infusion  30-200 mcg/min Intravenous To OR Theron Arista, Donata Clay, MD      . potassium chloride injection 80 mEq  80 mEq Other To OR Theron Arista, Donata Clay, MD      . tranexamic acid (CYKLOKAPRON) 2,500 mg in sodium chloride 0.9 % 250 mL (10 mg/mL) infusion  1.5 mg/kg/hr Intravenous To OR Theron Arista, Donata Clay, MD      . tranexamic acid (CYKLOKAPRON) bolus via infusion - over 30 minutes 1,560 mg  15 mg/kg Intravenous To OR Theron Arista, Donata Clay, MD      . tranexamic acid (CYKLOKAPRON) pump prime solution 208 mg  2 mg/kg Intracatheter To OR Theron Arista, Donata Clay, MD      . vancomycin Theron Arista) IVPB 1500 mg/300 mL  1,500 mg Intravenous To OR Luna Kitchens, MD       Facility-Administered  Medications Ordered in Other Encounters  Medication Dose Route Frequency Provider Last Rate Last Admin  . chlorhexidine (PERIDEX) 0.12 % solution           . fentaNYL (SUBLIMAZE) injection   Intravenous Anesthesia Intra-op Kerin Perna, CRNA   50 mcg at 10/24/19 0704  . lactated ringers infusion   Intravenous Continuous PRN 10/26/19, CRNA   New Bag at 10/24/19 [phone]  . midazolam (VERSED) 5  MG/5ML injection   Intravenous Anesthesia Intra-op Silas Flood C, CRNA   1 mg at 10/24/19 0102    Allergies  Allergen Reactions  . Sulfa Antibiotics Hives  . Codeine Itching                      Review of Systems :  [ y ] = yes, [  ] = no        General :  Weight gain [ y  ]    Weight loss  [   ]  Fatigue Blue.Reese  ]  Fever [  ]  Chills  [  ]                                          HEENT    Headache [  ]  Dizziness [  ]  Blurred vision [  ] Glaucoma  [  ]                          Nosebleeds [  ] Painful or loose teeth [  ]        Cardiac :  Chest pain/ pressure [ y ]  Resting SOB [  ] exertional SOB [ y ]                        Orthopnea [  ]  Pedal edema  [  ]  Palpitations [  ] Syncope/presyncope [ ]                         Paroxysmal nocturnal dyspnea [  ]         Pulmonary : cough [  ]  wheezing [  ]  Hemoptysis [  ] Sputum [  ] Snoring [  ]                              Pneumothorax [  ]  Sleep apnea [  ]        GI : Vomiting [  ]  Dysphagia [  ]  Melena  [  ]  Abdominal pain [  ] BRBPR [  ]              Heart burn [  ]  Constipation [  ] Diarrhea  [  ] Colonoscopy [   ]        GU : Hematuria [  ]  Dysuria [  ]  Nocturia [  ] UTI's [  ]        Vascular : Claudication [  ]  Rest pain [  ]  DVT [  ] Vein stripping [  ] leg ulcers [  ]                          TIA [  ] Stroke [  ]  Varicose veins [  ]        NEURO :  Headaches  [  ] Seizures [  ] Vision changes [  ] Paresthesias [  ]  Musculoskeletal :  Arthritis [  ] Gout  [  ]   Back pain [  ]  Joint pain [  ]        Skin :  Rash [  ]  Melanoma [  ] Sores [  ]        Heme : Bleeding problems [  ]Clotting Disorders [  ] Anemia [  ]Blood Transfusion [ ]         Endocrine : Diabetes [ y ] Heat or Cold intolerance [  ] Polyuria [  ]excessive thirst [ ]         Psych : Depression [  ]  Anxiety [  ]  Psych hospitalizations [  ] Memory change [  ]                                                                            BP 116/79   Pulse 79   Temp 98.6 F (37 C)   Resp 17   Ht 5\' 8"  (1.727 m)   Wt 104 kg   LMP 10/03/2011   SpO2 99%   BMI 34.86 kg/m  Physical Exam     Physical Exam  General: Overweight, intelligent healthy-appearing female no acute distress HEENT: Normocephalic pupils equal , dentition adequate Neck: Supple without JVD, adenopathy, or bruit Chest: Clear to auscultation, symmetrical breath sounds, no rhonchi, no tenderness             or deformity Cardiovascular: Regular rate and rhythm, no murmur, no gallop, peripheral pulses             palpable in all extremities Abdomen:  Soft, nontender, no palpable mass or organomegaly Extremities: Warm, well-perfused, no clubbing cyanosis edema or tenderness,              no venous stasis changes of the legs Rectal/GU: Deferred Neuro: Grossly non--focal and symmetrical throughout Skin: Clean and dry without rash or ulceration   Diagnostic Tests: Cardiac cath-severe multivessel CAD, 85% LAD, 80% first diagonal, 85% second diagonal, 85% distal posterior descending  Impression: Symptomatic multivessel CAD.  Normal LV function. Patient has failed medical therapy. Patient is not a candidate for PCI according to cardiology  Plan: Multivessel CABG planned for Tuesday, February 23 using arterial grafts to the LAD and dominant diagonal and vein graft to the secondary diagonal and distal posterior descending procedure indication benefits and risk of been discussed with patient who understands and  agrees to proceed.   Mikey Bussing, MD [facility] [phone]  Pre Procedure note for inpatients:   HAWANYA HECKSEL has been scheduled for Procedure(s): CORONARY ARTERY BYPASS GRAFTING (CABG) (N/A) TRANSESOPHAGEAL ECHOCARDIOGRAM (TEE) (N/A) today. The various methods of treatment have been discussed with the patient. After consideration of the risks, benefits and treatment options the patient has consented to the planned procedure.   The patient has been seen and labs reviewed. There are no changes in the patient's condition to prevent proceeding with the planned procedure today.  Recent labs:  Lab Results  Component Value Date   WBC 7.0 10/20/2019   HGB 13.8 10/20/2019   HCT 42.5 10/20/2019   PLT 246 10/20/2019  GLUCOSE 293 (H) 10/20/2019   CHOL 164 10/02/2019   TRIG 164 (H) 10/02/2019   HDL 58 10/02/2019   LDLCALC 78 10/02/2019   ALT 19 10/20/2019   AST 15 10/20/2019   NA 136 10/20/2019   K 4.2 10/20/2019   CL 100 10/20/2019   CREATININE 1.19 (H) 10/20/2019   BUN 27 (H) 10/20/2019   CO2 23 10/20/2019   INR 0.9 10/20/2019   HGBA1C 15.1 (H) 10/20/2019   MICROALBUR 0.96 06/02/2010    Mikey Bussing, MD 10/24/2019 7:49 AM

## 2019-10-24 NOTE — Progress Notes (Signed)
Started weaning 40% and rate of 4.

## 2019-10-24 NOTE — Progress Notes (Signed)
      [address]       [place] [address]             [phone]    S/p CABG   BP 95/71   Pulse 88   Temp (!) 96.4 F (35.8 C)   Resp 13   Ht 5\' 8"  (1.727 m)   Wt 104 kg   LMP 10/03/2011   SpO2 100%   BMI 34.86 kg/m  20/11 CI =1.5   Intake/Output Summary (Last 24 hours) at 10/24/2019 1822 Last data filed at 10/24/2019 1800 Gross per 24 hour  Intake 4077.77 ml  Output 3775 ml  Net 302.77 ml   Minimal CT output Hct= 31  Low index, high UO and low filling pressures- will give additional volume  10/26/2019 C. Viviann Spare, MD [facility] [phone]

## 2019-10-24 NOTE — Anesthesia Procedure Notes (Addendum)
Arterial Line Insertion Start/End2/23/2021 7:10 AM, 10/24/2019 7:20 AM Performed by: Shelton Silvas, MD, anesthesiologist  Preanesthetic checklist: patient identified, IV checked, site marked, risks and benefits discussed, surgical consent, monitors and equipment checked, pre-op evaluation and timeout performed Lidocaine 1% used for infiltration and patient sedated Right, radial was placed Catheter size: 20 G Maximum sterile barriers used   Attempts: 2 Procedure performed using ultrasound guided technique. Ultrasound Notes:anatomy identified, needle tip was noted to be adjacent to the nerve/plexus identified and no ultrasound evidence of intravascular and/or intraneural injection Following insertion, dressing applied and Biopatch. Post procedure assessment: normal  Patient tolerated the procedure well with no immediate complications.

## 2019-10-24 NOTE — Progress Notes (Signed)
Initiated 2nd step of wean protocol CP/PS 10/5

## 2019-10-24 NOTE — Anesthesia Procedure Notes (Signed)
Central Venous Catheter Insertion Performed by: Shelton Silvas, MD, anesthesiologist Start/End2/23/2021 7:05 AM, 10/24/2019 7:15 AM Patient location: Pre-op. Preanesthetic checklist: patient identified, IV checked, site marked, risks and benefits discussed, surgical consent, monitors and equipment checked, pre-op evaluation, timeout performed and anesthesia consent Position: Trendelenburg Lidocaine 1% used for infiltration and patient sedated Hand hygiene performed , maximum sterile barriers used  and Seldinger technique used Catheter size: 8.5 Fr Total catheter length 10. Central line was placed.Sheath introducer Swan type:thermodilution PA Cath depth:50 Procedure performed using ultrasound guided technique. Ultrasound Notes:anatomy identified, needle tip was noted to be adjacent to the nerve/plexus identified, no ultrasound evidence of intravascular and/or intraneural injection and image(s) printed for medical record Attempts: 1 Following insertion, line sutured and dressing applied. Post procedure assessment: blood return through all ports, free fluid flow and no air  Patient tolerated the procedure well with no immediate complications.

## 2019-10-24 NOTE — Progress Notes (Signed)
Patients NIF was -25 and VC was 850

## 2019-10-24 NOTE — Brief Op Note (Signed)
10/24/2019  1:06 PM  PATIENT:  [name]  49 y.o. female  PRE-OPERATIVE DIAGNOSIS:  CORONARY ARTERY DISEASE  POST-OPERATIVE DIAGNOSIS:  CORONARY ARTERY DISEASE  PROCEDURE:  Procedure(s): CORONARY ARTERY BYPASS GRAFTING (CABG) times  , using left radial artery harvested endoscopically and right greater saphenous vein harvested endoscopically. (N/A) TRANSESOPHAGEAL ECHOCARDIOGRAM (TEE) (N/A) Radial Artery Harvest (Left) LIMA-LAD LEFT RADIAL -DIAG SVG-DIAG2 SVG-PDA  SURGEON:  Surgeon(s) and Role:    Kerin Perna, MD - Primary  PHYSICIAN ASSISTANT: WAYNE GOLD PA-C  ANESTHESIA:   general  EBL:  300 cc   BLOOD ADMINISTERED 1 U PRBC on CPB  DRAINS: PLEURAL AND PERICARDIAL CHEST TUBES   LOCAL MEDICATIONS USED:  NONE  SPECIMEN:  No Specimen  DISPOSITION OF SPECIMEN:  N/A  COUNTS:  YES  TOURNIQUET:   Total Tourniquet Time Documented: Upper Arm (Left) - 33 minutes Total: Upper Arm (Left) - 33 minutes   DICTATION: .Other Dictation: Dictation Number PENDING  PLAN OF CARE: Admit to inpatient   PATIENT DISPOSITION:  ICU - intubated and hemodynamically stable.   Delay start of Pharmacological VTE agent (>24hrs) due to surgical blood loss or risk of bleeding: yes

## 2019-10-25 ENCOUNTER — Inpatient Hospital Stay (HOSPITAL_COMMUNITY): Payer: 59

## 2019-10-25 ENCOUNTER — Encounter: Payer: Self-pay | Admitting: Internal Medicine

## 2019-10-25 DIAGNOSIS — E11319 Type 2 diabetes mellitus with unspecified diabetic retinopathy without macular edema: Secondary | ICD-10-CM | POA: Insufficient documentation

## 2019-10-25 LAB — GLUCOSE, CAPILLARY
Glucose-Capillary: 146 mg/dL — ABNORMAL HIGH (ref 70–99)
Glucose-Capillary: 147 mg/dL — ABNORMAL HIGH (ref 70–99)
Glucose-Capillary: 151 mg/dL — ABNORMAL HIGH (ref 70–99)
Glucose-Capillary: 152 mg/dL — ABNORMAL HIGH (ref 70–99)
Glucose-Capillary: 155 mg/dL — ABNORMAL HIGH (ref 70–99)
Glucose-Capillary: 157 mg/dL — ABNORMAL HIGH (ref 70–99)
Glucose-Capillary: 159 mg/dL — ABNORMAL HIGH (ref 70–99)
Glucose-Capillary: 160 mg/dL — ABNORMAL HIGH (ref 70–99)
Glucose-Capillary: 162 mg/dL — ABNORMAL HIGH (ref 70–99)
Glucose-Capillary: 162 mg/dL — ABNORMAL HIGH (ref 70–99)
Glucose-Capillary: 166 mg/dL — ABNORMAL HIGH (ref 70–99)
Glucose-Capillary: 175 mg/dL — ABNORMAL HIGH (ref 70–99)
Glucose-Capillary: 176 mg/dL — ABNORMAL HIGH (ref 70–99)
Glucose-Capillary: 188 mg/dL — ABNORMAL HIGH (ref 70–99)
Glucose-Capillary: 203 mg/dL — ABNORMAL HIGH (ref 70–99)

## 2019-10-25 LAB — POCT I-STAT, CHEM 8
BUN: 23 mg/dL — ABNORMAL HIGH (ref 6–20)
BUN: 17 mg/dL (ref 6–20)
BUN: 20 mg/dL (ref 6–20)
BUN: 17 mg/dL (ref 6–20)
BUN: 18 mg/dL (ref 6–20)
BUN: 17 mg/dL (ref 6–20)
BUN: 18 mg/dL (ref 6–20)
BUN: 18 mg/dL (ref 6–20)
BUN: 16 mg/dL (ref 6–20)
Calcium, Ion: 1.27 mmol/L (ref 1.15–1.40)
Calcium, Ion: 1.01 mmol/L — ABNORMAL LOW (ref 1.15–1.40)
Calcium, Ion: 1.27 mmol/L (ref 1.15–1.40)
Calcium, Ion: 1.06 mmol/L — ABNORMAL LOW (ref 1.15–1.40)
Calcium, Ion: 1.1 mmol/L — ABNORMAL LOW (ref 1.15–1.40)
Calcium, Ion: 1.06 mmol/L — ABNORMAL LOW (ref 1.15–1.40)
Calcium, Ion: 1.12 mmol/L — ABNORMAL LOW (ref 1.15–1.40)
Calcium, Ion: 1.06 mmol/L — ABNORMAL LOW (ref 1.15–1.40)
Calcium, Ion: 1.13 mmol/L — ABNORMAL LOW (ref 1.15–1.40)
Chloride: 104 mmol/L (ref 98–111)
Chloride: 102 mmol/L (ref 98–111)
Chloride: 104 mmol/L (ref 98–111)
Chloride: 103 mmol/L (ref 98–111)
Chloride: 99 mmol/L (ref 98–111)
Chloride: 101 mmol/L (ref 98–111)
Chloride: 102 mmol/L (ref 98–111)
Chloride: 101 mmol/L (ref 98–111)
Chloride: 103 mmol/L (ref 98–111)
Creatinine, Ser: 1 mg/dL (ref 0.44–1.00)
Creatinine, Ser: 0.7 mg/dL (ref 0.44–1.00)
Creatinine, Ser: 0.8 mg/dL (ref 0.44–1.00)
Creatinine, Ser: 0.7 mg/dL (ref 0.44–1.00)
Creatinine, Ser: 0.7 mg/dL (ref 0.44–1.00)
Creatinine, Ser: 0.6 mg/dL (ref 0.44–1.00)
Creatinine, Ser: 0.7 mg/dL (ref 0.44–1.00)
Creatinine, Ser: 0.7 mg/dL (ref 0.44–1.00)
Creatinine, Ser: 0.6 mg/dL (ref 0.44–1.00)
Glucose, Bld: 90 mg/dL (ref 70–99)
Glucose, Bld: 56 mg/dL — ABNORMAL LOW (ref 70–99)
Glucose, Bld: 61 mg/dL — ABNORMAL LOW (ref 70–99)
Glucose, Bld: 105 mg/dL — ABNORMAL HIGH (ref 70–99)
Glucose, Bld: 119 mg/dL — ABNORMAL HIGH (ref 70–99)
Glucose, Bld: 113 mg/dL — ABNORMAL HIGH (ref 70–99)
Glucose, Bld: 134 mg/dL — ABNORMAL HIGH (ref 70–99)
Glucose, Bld: 134 mg/dL — ABNORMAL HIGH (ref 70–99)
Glucose, Bld: 117 mg/dL — ABNORMAL HIGH (ref 70–99)
HCT: 35 % — ABNORMAL LOW (ref 36.0–46.0)
HCT: 28 % — ABNORMAL LOW (ref 36.0–46.0)
HCT: 32 % — ABNORMAL LOW (ref 36.0–46.0)
HCT: 26 % — ABNORMAL LOW (ref 36.0–46.0)
HCT: 27 % — ABNORMAL LOW (ref 36.0–46.0)
HCT: 24 % — ABNORMAL LOW (ref 36.0–46.0)
HCT: 26 % — ABNORMAL LOW (ref 36.0–46.0)
HCT: 24 % — ABNORMAL LOW (ref 36.0–46.0)
HCT: 29 % — ABNORMAL LOW (ref 36.0–46.0)
Hemoglobin: 11.9 g/dL — ABNORMAL LOW (ref 12.0–15.0)
Hemoglobin: 10.9 g/dL — ABNORMAL LOW (ref 12.0–15.0)
Hemoglobin: 9.5 g/dL — ABNORMAL LOW (ref 12.0–15.0)
Hemoglobin: 8.8 g/dL — ABNORMAL LOW (ref 12.0–15.0)
Hemoglobin: 9.2 g/dL — ABNORMAL LOW (ref 12.0–15.0)
Hemoglobin: 8.2 g/dL — ABNORMAL LOW (ref 12.0–15.0)
Hemoglobin: 8.8 g/dL — ABNORMAL LOW (ref 12.0–15.0)
Hemoglobin: 8.2 g/dL — ABNORMAL LOW (ref 12.0–15.0)
Hemoglobin: 9.9 g/dL — ABNORMAL LOW (ref 12.0–15.0)
Potassium: 3.9 mmol/L (ref 3.5–5.1)
Potassium: 3.4 mmol/L — ABNORMAL LOW (ref 3.5–5.1)
Potassium: 3.8 mmol/L (ref 3.5–5.1)
Potassium: 3.4 mmol/L — ABNORMAL LOW (ref 3.5–5.1)
Potassium: 4 mmol/L (ref 3.5–5.1)
Potassium: 4 mmol/L (ref 3.5–5.1)
Potassium: 4.1 mmol/L (ref 3.5–5.1)
Potassium: 3.8 mmol/L (ref 3.5–5.1)
Potassium: 3.6 mmol/L (ref 3.5–5.1)
Sodium: 140 mmol/L (ref 135–145)
Sodium: 141 mmol/L (ref 135–145)
Sodium: 142 mmol/L (ref 135–145)
Sodium: 137 mmol/L (ref 135–145)
Sodium: 140 mmol/L (ref 135–145)
Sodium: 139 mmol/L (ref 135–145)
Sodium: 139 mmol/L (ref 135–145)
Sodium: 140 mmol/L (ref 135–145)
Sodium: 141 mmol/L (ref 135–145)
TCO2: 31 mmol/L (ref 22–32)
TCO2: 27 mmol/L (ref 22–32)
TCO2: 28 mmol/L (ref 22–32)
TCO2: 27 mmol/L (ref 22–32)
TCO2: 27 mmol/L (ref 22–32)
TCO2: 28 mmol/L (ref 22–32)
TCO2: 28 mmol/L (ref 22–32)
TCO2: 29 mmol/L (ref 22–32)
TCO2: 25 mmol/L (ref 22–32)

## 2019-10-25 LAB — POCT I-STAT 7, (LYTES, BLD GAS, ICA,H+H)
Acid-Base Excess: 1 mmol/L (ref 0.0–2.0)
Acid-base deficit: 1 mmol/L (ref 0.0–2.0)
Bicarbonate: 27.4 mmol/L (ref 20.0–28.0)
Bicarbonate: 25.5 mmol/L (ref 20.0–28.0)
Calcium, Ion: 1.02 mmol/L — ABNORMAL LOW (ref 1.15–1.40)
Calcium, Ion: 1.1 mmol/L — ABNORMAL LOW (ref 1.15–1.40)
HCT: 26 % — ABNORMAL LOW (ref 36.0–46.0)
HCT: 28 % — ABNORMAL LOW (ref 36.0–46.0)
Hemoglobin: 8.8 g/dL — ABNORMAL LOW (ref 12.0–15.0)
Hemoglobin: 9.5 g/dL — ABNORMAL LOW (ref 12.0–15.0)
O2 Saturation: 100 %
O2 Saturation: 99 %
Potassium: 3.7 mmol/L (ref 3.5–5.1)
Potassium: 3.7 mmol/L (ref 3.5–5.1)
Sodium: 142 mmol/L (ref 135–145)
Sodium: 142 mmol/L (ref 135–145)
TCO2: 29 mmol/L (ref 22–32)
TCO2: 27 mmol/L (ref 22–32)
pCO2 arterial: 50.4 mmHg — ABNORMAL HIGH (ref 32.0–48.0)
pCO2 arterial: 50.2 mmHg — ABNORMAL HIGH (ref 32.0–48.0)
pH, Arterial: 7.343 — ABNORMAL LOW (ref 7.350–7.450)
pH, Arterial: 7.313 — ABNORMAL LOW (ref 7.350–7.450)
pO2, Arterial: 396 mmHg — ABNORMAL HIGH (ref 83.0–108.0)
pO2, Arterial: 160 mmHg — ABNORMAL HIGH (ref 83.0–108.0)

## 2019-10-25 LAB — BASIC METABOLIC PANEL
Anion gap: 7 (ref 5–15)
Anion gap: 8 (ref 5–15)
BUN: 19 mg/dL (ref 6–20)
BUN: 23 mg/dL — ABNORMAL HIGH (ref 6–20)
CO2: 23 mmol/L (ref 22–32)
CO2: 22 mmol/L (ref 22–32)
Calcium: 7.9 mg/dL — ABNORMAL LOW (ref 8.9–10.3)
Calcium: 8.4 mg/dL — ABNORMAL LOW (ref 8.9–10.3)
Chloride: 109 mmol/L (ref 98–111)
Chloride: 105 mmol/L (ref 98–111)
Creatinine, Ser: 1.01 mg/dL — ABNORMAL HIGH (ref 0.44–1.00)
Creatinine, Ser: 1.2 mg/dL — ABNORMAL HIGH (ref 0.44–1.00)
GFR calc Af Amer: >60 mL/min (ref >60)
GFR calc Af Amer: >60 mL/min (ref >60)
GFR calc non Af Amer: >60 mL/min (ref >60)
GFR calc non Af Amer: 53 mL/min — ABNORMAL LOW (ref >60)
Glucose, Bld: 153 mg/dL — ABNORMAL HIGH (ref 70–99)
Glucose, Bld: 193 mg/dL — ABNORMAL HIGH (ref 70–99)
Potassium: 4.2 mmol/L (ref 3.5–5.1)
Potassium: 4.1 mmol/L (ref 3.5–5.1)
Sodium: 139 mmol/L (ref 135–145)
Sodium: 135 mmol/L (ref 135–145)

## 2019-10-25 LAB — CBC
HCT: 27.2 % — ABNORMAL LOW (ref 36.0–46.0)
HCT: 24.4 % — ABNORMAL LOW (ref 36.0–46.0)
Hemoglobin: 8.7 g/dL — ABNORMAL LOW (ref 12.0–15.0)
Hemoglobin: 7.8 g/dL — ABNORMAL LOW (ref 12.0–15.0)
MCH: 28 pg (ref 26.0–34.0)
MCH: 28.1 pg (ref 26.0–34.0)
MCHC: 32 g/dL (ref 30.0–36.0)
MCHC: 32 g/dL (ref 30.0–36.0)
MCV: 87.5 fL (ref 80.0–100.0)
MCV: 87.8 fL (ref 80.0–100.0)
Platelets: 125 10*3/uL — ABNORMAL LOW (ref 150–400)
Platelets: 102 10*3/uL — ABNORMAL LOW (ref 150–400)
RBC: 3.11 MIL/uL — ABNORMAL LOW (ref 3.87–5.11)
RBC: 2.78 MIL/uL — ABNORMAL LOW (ref 3.87–5.11)
RDW: 12.9 % (ref 11.5–15.5)
RDW: 13 % (ref 11.5–15.5)
WBC: 8.6 10*3/uL (ref 4.0–10.5)
WBC: 7.5 10*3/uL (ref 4.0–10.5)
nRBC: 0 % (ref 0.0–0.2)
nRBC: 0 % (ref 0.0–0.2)

## 2019-10-25 LAB — MAGNESIUM
Magnesium: 2.1 mg/dL (ref 1.7–2.4)
Magnesium: 2 mg/dL (ref 1.7–2.4)

## 2019-10-25 LAB — COOXEMETRY PANEL
Carboxyhemoglobin: 1.4 % (ref 0.5–1.5)
Carboxyhemoglobin: 1.2 % (ref 0.5–1.5)
Methemoglobin: 1.1 % (ref 0.0–1.5)
Methemoglobin: 0.4 % (ref 0.0–1.5)
O2 Saturation: 51.6 %
O2 Saturation: 52.1 %
Total hemoglobin: 8.9 g/dL — ABNORMAL LOW (ref 12.0–16.0)
Total hemoglobin: 7.8 g/dL — ABNORMAL LOW (ref 12.0–16.0)

## 2019-10-25 MED ORDER — FUROSEMIDE 10 MG/ML IJ SOLN
20.0000 mg | Freq: Every day | INTRAMUSCULAR | Status: DC
  Administered 2019-10-25: 20 mg via INTRAVENOUS
  Filled 2019-10-25: qty 2

## 2019-10-25 MED ORDER — ALBUMIN HUMAN 25 % IV SOLN
12.5000 g | Freq: Four times a day (QID) | INTRAVENOUS | Status: AC
  Administered 2019-10-25 – 2019-10-26 (×4): 12.5 g via INTRAVENOUS
  Filled 2019-10-25 (×4): qty 50

## 2019-10-25 MED ORDER — LEVALBUTEROL HCL 1.25 MG/0.5ML IN NEBU: 1.2500 mg | INHALATION_SOLUTION | Freq: Four times a day (QID) | RESPIRATORY_TRACT | Status: DC | PRN

## 2019-10-25 MED ORDER — PHENYLEPHRINE HCL-NACL 10-0.9 MG/250ML-% IV SOLN
INTRAVENOUS | Status: AC
  Filled 2019-10-25: qty 250

## 2019-10-25 MED ORDER — KETOROLAC TROMETHAMINE 15 MG/ML IJ SOLN
15.0000 mg | Freq: Four times a day (QID) | INTRAMUSCULAR | Status: DC | PRN
  Administered 2019-10-25 (×2): 15 mg via INTRAVENOUS
  Filled 2019-10-25 (×2): qty 1

## 2019-10-25 MED ORDER — ISOSORBIDE MONONITRATE ER 30 MG PO TB24
15.0000 mg | ORAL_TABLET | Freq: Every day | ORAL | Status: DC
  Administered 2019-10-26 – 2019-10-29 (×4): 15 mg via ORAL
  Filled 2019-10-25 (×4): qty 1

## 2019-10-25 MED ORDER — FE FUMARATE-B12-VIT C-FA-IFC PO CAPS
1.0000 | ORAL_CAPSULE | Freq: Two times a day (BID) | ORAL | Status: DC
  Administered 2019-10-25 – 2019-10-29 (×8): 1 via ORAL
  Filled 2019-10-25 (×8): qty 1

## 2019-10-25 MED ORDER — OXYCODONE HCL 5 MG PO TABS
5.0000 mg | ORAL_TABLET | ORAL | Status: DC | PRN
  Administered 2019-10-25 – 2019-10-27 (×6): 5 mg via ORAL
  Filled 2019-10-25 (×7): qty 1

## 2019-10-25 MED ORDER — INSULIN ASPART 100 UNIT/ML ~~LOC~~ SOLN
4.0000 [IU] | Freq: Three times a day (TID) | SUBCUTANEOUS | Status: DC
  Administered 2019-10-26 – 2019-10-29 (×8): 4 [IU] via SUBCUTANEOUS

## 2019-10-25 MED ORDER — INSULIN ASPART 100 UNIT/ML ~~LOC~~ SOLN
0.0000 [IU] | SUBCUTANEOUS | Status: DC
  Administered 2019-10-25: 8 [IU] via SUBCUTANEOUS
  Administered 2019-10-25: 2 [IU] via SUBCUTANEOUS
  Administered 2019-10-25 (×2): 4 [IU] via SUBCUTANEOUS
  Administered 2019-10-26: 8 [IU] via SUBCUTANEOUS
  Administered 2019-10-26: 2 [IU] via SUBCUTANEOUS

## 2019-10-25 MED ORDER — INSULIN DETEMIR 100 UNIT/ML ~~LOC~~ SOLN
8.0000 [IU] | Freq: Two times a day (BID) | SUBCUTANEOUS | Status: DC
  Administered 2019-10-25 (×2): 8 [IU] via SUBCUTANEOUS
  Filled 2019-10-25 (×4): qty 0.08

## 2019-10-25 MED ORDER — ENOXAPARIN SODIUM 40 MG/0.4ML ~~LOC~~ SOLN
40.0000 mg | Freq: Every day | SUBCUTANEOUS | Status: DC
  Administered 2019-10-26: 40 mg via SUBCUTANEOUS
  Filled 2019-10-25: qty 0.4

## 2019-10-25 MED ORDER — MIDAZOLAM HCL 2 MG/2ML IJ SOLN: 1.0000 mg | INTRAMUSCULAR | Status: DC | PRN

## 2019-10-25 NOTE — Op Note (Signed)
NAME: [name], [name] MEDICAL RECORD [id number] ACCOUNT [id number] DATE OF BIRTH:November 04, 1970 FACILITY: [facility] LOCATION: [facility] PHYSICIAN:Kahne Helfand VAN TRIGT III, MD  OPERATIVE REPORT  DATE OF PROCEDURE:  10/24/2019  OPERATION: 1.  Coronary artery bypass grafting x4 (left internal mammary artery to left anterior descending, left radial artery graft to first diagonal, saphenous vein graft to diagonal, saphenous vein graft to posterior descending). 2.  Endoscopic harvest of left radial artery. 3.  Endoscopic harvest of left leg greater saphenous vein.  SURGEON:  Ivin Poot, MD  ASSISTANT:  Jadene Pierini, PA-C  PREOPERATIVE DIAGNOSES:  Severe 3-vessel coronary artery disease with unstable angina.  POSTOPERATIVE DIAGNOSES:  Severe 3-vessel coronary artery disease with unstable angina.  ANESTHESIA:  General by Dr. Harvest Dark.  CLINICAL NOTE:  The patient is a 49 year old diabetic smoker with accelerating angina and cardiac catheterization documenting 3-vessel coronary artery disease with preserved LV function.  Due to her diabetes and the anatomy of her coronary disease, she  was felt to be a candidate for surgical coronary revascularization.  I saw the patient in consultation in the office after reviewing the images of her cardiac cath and echocardiogram.  I agreed with the recommendation for multivessel coronary bypass  surgery for treatment of her severe coronary disease.  I discussed the procedure of CABG using left internal mammary artery, left radial artery and endoscopically harvested saphenous vein.  I reviewed the major aspects of the procedure including the  location of the surgical incisions, the use of general anesthesia in cardiopulmonary bypass and the expected postoperative hospital recovery.  I reviewed the potential risks to her of the surgery including the risk of stroke, bleeding, infection, organ  failure, arrhythmia, and death.  She demonstrated her understanding and  agreed to proceed with surgery under what I felt was informed consent.  OPERATIVE FINDINGS: 1.  Diffusely diseased coronary disease with adequate, but difficult targets. 2.  Adequate conduit. 3.  No blood products required for the surgery. 4.  Preservation of LV systolic function by echo after separation from cardiopulmonary bypass.  DESCRIPTION OF PROCEDURE:  The patient was brought from preoperative holding to the operating room after informed consent was documented and the proper site of surgery was marked and all questions and issues addressed.  The patient was placed supine on  the operating table and general anesthesia was induced.  A transesophageal echo probe was placed by the anesthesia team.  The patient was prepped and draped as a sterile field.  A proper time-out was performed.  A sternal incision was made as a left arm  endoscopic incision was made for harvesting the left radial artery.  The left internal mammary artery was harvested as a pedicle graft from its origin at the subclavian vessels.  It was a 1.5 mm vessel with good flow.  The sternal retractor was placed  using the deep blades because of the patient's obese body habitus.  The pericardium was opened and suspended.  The ascending aorta had mild atherosclerotic changes.  After the radial artery was harvested and the arm incision was closed, the left greater  saphenous vein was harvested from the thigh and that incision was closed.  The patient was then systemically heparinized and the patient was cannulated and placed on cardiopulmonary bypass.  The coronaries were identified for grafting.  They were diffusely diseased and determining the location of the anastomoses was  challenging.  Cardioplegia cannulas were placed for both antegrade and retrograde cold blood cardioplegia.  The mammary artery, radial  artery and veins were prepared for the distal anastomoses.  The patient was cooled to 32 degrees and the aortic crossclamp  was  applied.  One L of cold blood cardioplegia was delivered in split doses between the antegrade aortic and retrograde coronary sinus catheters.  There was good cardioplegic arrest and supple temperature dropped less than 12 degrees.  Cardioplegia was  delivered every 20 minutes.  The distal coronary anastomoses were performed.  The first distal anastomosis was the posterior descending branch of the right coronary.  This was a 1.4 mm vessel, proximal 70% stenosis in the mid PD.  A reverse saphenous vein was sewn end-to-side with  running 7-0 Prolene with good flow through the graft.  Cardioplegia was redosed.  The second distal anastomosis was the first diagonal branch of the LAD.  It had an ostial 80-90% stenosis.  Left radial artery graft was sewn end-to-side with running 8-0 Prolene.  There was good flow through the graft.  Cardioplegia was redosed.  The third distal anastomosis was the second diagonal branch of the LAD.  This had an ostial 80% stenosis.  Reverse saphenous vein was sewn end-to-side with running 7-0 Prolene with good flow through the graft.  Cardioplegia was redosed.  The fourth distal anastomosis was the mid-LAD.  There was a proximal 90% stenosis.  The left IMA pedicle was brought through an opening in the left lateral pericardium and was brought down onto the LAD and sewn end-to-side with running 8-0 Prolene.   There was good flow through the anastomosis after briefly releasing the pedicle bulldog on the mammary artery.  The bulldog was reapplied and the pedicle was secured to the epicardium with 6-0 Prolenes.  Cardioplegia was redosed.  While crossclamp was still in place, the 2 vein proximal anastomoses were performed using a 4.5 mm punch and running 6-0 Prolene.  Prior to tying down the final proximal anastomosis, air was vented from the coronaries with a dose of retrograde warm blood  cardioplegia.  The crossclamp was removed.  The heart resumed a spontaneous rhythm.  The  vein grafts were deaired and opened and each had good flow.  The radial artery proximal anastomosis was then placed to the hood of the vein graft to the second  diagonal using running 7-0 Prolene and this was deaired and then opened.  All of the grafts were checked for hemostasis, which was documented at the proximal and distal anastomoses.  Temporary pacing wires were applied.  The patient was rewarmed and reperfused.  The lungs reexpanded.  Ventilator was resumed.  The patient was weaned off cardiopulmonary bypass without difficulty with good preserved LV global function.  Cardiac output was normal.  Protamine was administered without adverse reaction.  The cannulas were removed.  The mediastinum was irrigated.   Adequate hemostasis was achieved.  The superior pericardial fat was closed over the aorta and vein grafts.  The anterior mediastinal, posterior mediastinum and left pleural chest tubes were placed and brought out through separate incisions.  The sternum was closed with interrupted steel wire.  The patient remained stable.  The pectoralis fascia was closed with a running #1 Vicryl.  The subcutaneous and skin layers were closed with a running Vicryl and sterile dressings were applied.  Total cardiopulmonary bypass time was 170 minutes.  VN/NUANCE  D:10/25/2019 T:10/25/2019 JOB:[id number]

## 2019-10-25 NOTE — Anesthesia Postprocedure Evaluation (Signed)
Anesthesia Post Note  Patient: [name]  Procedure(s) Performed: CORONARY ARTERY BYPASS GRAFTING (CABG) times four, using left radial artery harvested endoscopically and right greater saphenous vein harvested endoscopically. (N/A Chest) TRANSESOPHAGEAL ECHOCARDIOGRAM (TEE) (N/A ) Radial Artery Harvest (Left Arm Lower)     Patient location during evaluation: PACU Anesthesia Type: General Level of consciousness: awake and alert Pain management: pain level controlled Vital Signs Assessment: post-procedure vital signs reviewed and stable Respiratory status: spontaneous breathing, nonlabored ventilation, respiratory function stable and patient connected to nasal cannula oxygen Cardiovascular status: blood pressure returned to baseline and stable Postop Assessment: no apparent nausea or vomiting Anesthetic complications: no    Last Vitals:  Vitals:   10/25/19 0800 10/25/19 0815  BP:    Pulse: 88 88  Resp: 15 14  Temp: 37.5 C 37.4 C  SpO2: 100% 100%    Last Pain:  Vitals:   10/25/19 1008  TempSrc:   PainSc: 4                  Shelton Silvas

## 2019-10-25 NOTE — Progress Notes (Signed)
1 Day Post-Op Procedure(s) (LRB): CORONARY ARTERY BYPASS GRAFTING (CABG) times four, using left radial artery harvested endoscopically and right greater saphenous vein harvested endoscopically. (N/A) TRANSESOPHAGEAL ECHOCARDIOGRAM (TEE) (N/A) Radial Artery Harvest (Left) Subjective: Up in chair nsr O2 sat .95 on room air BP soft- add low dose norepi and DC neo  Objective: Vital signs in last 24 hours: Temp:  [93.6 F (34.2 C)-99.9 F (37.7 C)] 99 F (37.2 C) (02/24 1035) Pulse Rate:  [30-101] 84 (02/24 1715) Cardiac Rhythm: Normal sinus rhythm (02/24 0800) Resp:  [7-32] 19 (02/24 1715) BP: (70-135)/(40-122) 93/56 (02/24 1715) SpO2:  [69 %-100 %] 99 % (02/24 1715) Arterial Line BP: (72-135)/(29-78) 94/41 (02/24 1645) FiO2 (%):  [40 %] 40 % (02/23 2103) Weight:  [115.5 kg] 115.5 kg (02/24 0500)  Hemodynamic parameters for last 24 hours: PAP: (17-37)/(2-23) 25/8 CO:  [3.4 L/min-6.7 L/min] 6.3 L/min CI:  [1.6 L/min/m2-3.1 L/min/m2] 2.9 L/min/m2  Intake/Output from previous day: 02/23 0701 - 02/24 0700 In: 6903.8 [I.V.:3970.7; Blood:250; IV Piggyback:2683.1] Out: 5080 [Urine:3665; Emesis/NG output:150; Blood:850; Chest Tube:415] Intake/Output this shift: Total I/O In: 1050.5 [I.V.:640.7; IV Piggyback:409.8] Out: 565 [Urine:415; Chest Tube:150]  Neuro intact Min chest tube drainage Pm labs- Hb 7.8 -- start po iron  Lab Results: Recent Labs    10/25/19 0418 10/25/19 1622  WBC 8.6 7.5  HGB 8.7* 7.8*  HCT 27.2* 24.4*  PLT 125* 102*   BMET:  Recent Labs    10/25/19 0418 10/25/19 1622  NA 139 135  K 4.2 4.1  CL 109 105  CO2 23 22  GLUCOSE 153* 193*  BUN 19 23*  CREATININE 1.01* 1.20*  CALCIUM 7.9* 8.4*    PT/INR:  Recent Labs    10/24/19 1612  LABPROT 16.8*  INR 1.4*   ABG    Component Value Date/Time   PHART 7.307 (L) 10/24/2019 2238   HCO3 22.2 10/24/2019 2238   TCO2 24 10/24/2019 2238   ACIDBASEDEF 4.0 (H) 10/24/2019 2238   O2SAT 52.1  10/25/2019 1625   CBG (last 3)  Recent Labs    10/25/19 1126 10/25/19 1252 10/25/19 1616  GLUCAP 147* 152* 188*    Assessment/Plan: S/P Procedure(s) (LRB): CORONARY ARTERY BYPASS GRAFTING (CABG) times four, using left radial artery harvested endoscopically and right greater saphenous vein harvested endoscopically. (N/A) TRANSESOPHAGEAL ECHOCARDIOGRAM (TEE) (N/A) Radial Artery Harvest (Left) Cont current care Po iron for expected postop blood loss anemia  LOS: 1 day    [name] 10/25/2019

## 2019-10-25 NOTE — Plan of Care (Signed)
°  Problem: Clinical Measurements: °Goal: Ability to maintain clinical measurements within normal limits will improve °Outcome: Progressing °Goal: Will remain free from infection °Outcome: Progressing °Goal: Diagnostic test results will improve °Outcome: Progressing °Goal: Respiratory complications will improve °Outcome: Progressing °Goal: Cardiovascular complication will be avoided °Outcome: Progressing °  °Problem: Coping: °Goal: Level of anxiety will decrease °Outcome: Progressing °  °

## 2019-10-25 NOTE — Progress Notes (Signed)
1 Day Post-Op Procedure(s) (LRB): CORONARY ARTERY BYPASS GRAFTING (CABG) times four, using left radial artery harvested endoscopically and right greater saphenous vein harvested endoscopically. (N/A) TRANSESOPHAGEAL ECHOCARDIOGRAM (TEE) (N/A) Radial Artery Harvest (Left) Subjective: Extubated with cardiac output 5.8 Multiple sites of pain Objective: Vital signs in last 24 hours: Temp:  [93.6 F (34.2 C)-99.9 F (37.7 C)] 99.3 F (37.4 C) (02/24 0815) Pulse Rate:  [30-101] 88 (02/24 0815) Cardiac Rhythm: Normal sinus rhythm (02/24 0400) Resp:  [7-32] 14 (02/24 0815) BP: (83-135)/(48-122) 88/70 (02/24 0745) SpO2:  [69 %-100 %] 100 % (02/24 0815) Arterial Line BP: (79-213)/(42-209) 110/51 (02/24 0815) FiO2 (%):  [40 %-50 %] 40 % (02/23 2103) Weight:  [115.5 kg] 115.5 kg (02/24 0500)  Hemodynamic parameters for last 24 hours: PAP: (15-37)/(2-23) 27/15 CO:  [2.9 L/min-6.7 L/min] 6.7 L/min CI:  [1.3 L/min/m2-3.1 L/min/m2] 3.1 L/min/m2  Intake/Output from previous day: 02/23 0701 - 02/24 0700 In: 6903.8 [I.V.:3970.7; Blood:250; IV Piggyback:2683.1] Out: 5080 [Urine:3665; Emesis/NG output:150; Blood:850; Chest Tube:415] Intake/Output this shift: No intake/output data recorded.       Exam    General- alert and comfortable    Neck- no JVD, no cervical adenopathy palpable, no carotid bruit   Lungs- clear without rales, wheezes   Cor- regular rate and rhythm, no murmur , gallop   Abdomen- soft, non-tender   Extremities - warm, non-tender, minimal edema   Neuro- oriented, appropriate, no focal weakness    Lab Results: Recent Labs    10/24/19 2053 10/24/19 2122 10/24/19 2238 10/25/19 0418  WBC 7.7  --   --  8.6  HGB 9.0*   < > 8.5* 8.7*  HCT 27.4*   < > 25.0* 27.2*  PLT 108*  --   --  125*   < > = values in this interval not displayed.   BMET:  Recent Labs    10/24/19 2053 10/24/19 2122 10/24/19 2238 10/25/19 0418  NA 138   < > 140 139  K 4.6   < > 4.5 4.2  CL  111  --   --  109  CO2 22  --   --  23  GLUCOSE 145*  --   --  153*  BUN 17  --   --  19  CREATININE 0.85  --   --  1.01*  CALCIUM 7.4*  --   --  7.9*   < > = values in this interval not displayed.    PT/INR:  Recent Labs    10/24/19 1612  LABPROT 16.8*  INR 1.4*   ABG    Component Value Date/Time   PHART 7.307 (L) 10/24/2019 2238   HCO3 22.2 10/24/2019 2238   TCO2 24 10/24/2019 2238   ACIDBASEDEF 4.0 (H) 10/24/2019 2238   O2SAT 51.6 10/25/2019 0418   CBG (last 3)  Recent Labs    10/25/19 0413 10/25/19 0548 10/25/19 0700  GLUCAP 146* 159* 155*    Assessment/Plan: S/P Procedure(s) (LRB): CORONARY ARTERY BYPASS GRAFTING (CABG) times four, using left radial artery harvested endoscopically and right greater saphenous vein harvested endoscopically. (N/A) TRANSESOPHAGEAL ECHOCARDIOGRAM (TEE) (N/A) Radial Artery Harvest (Left) Mobilize Diuresis Diabetes control start imdur, DC iv NTG  Wean neo < 20 mcg then DC a-line   LOS: 1 day    Kathlee Nations Trigt III 10/25/2019

## 2019-10-26 ENCOUNTER — Inpatient Hospital Stay (HOSPITAL_COMMUNITY): Payer: 59

## 2019-10-26 ENCOUNTER — Inpatient Hospital Stay: Payer: Self-pay

## 2019-10-26 LAB — COOXEMETRY PANEL
Carboxyhemoglobin: 1.8 % — ABNORMAL HIGH (ref 0.5–1.5)
Methemoglobin: 1.2 % (ref 0.0–1.5)
O2 Saturation: 62.3 %
Total hemoglobin: 8.9 g/dL — ABNORMAL LOW (ref 12.0–16.0)

## 2019-10-26 LAB — RENAL FUNCTION PANEL
Albumin: 3.4 g/dL — ABNORMAL LOW (ref 3.5–5.0)
Anion gap: 9 (ref 5–15)
BUN: 25 mg/dL — ABNORMAL HIGH (ref 6–20)
CO2: 25 mmol/L (ref 22–32)
Calcium: 8.8 mg/dL — ABNORMAL LOW (ref 8.9–10.3)
Chloride: 105 mmol/L (ref 98–111)
Creatinine, Ser: 1.29 mg/dL — ABNORMAL HIGH (ref 0.44–1.00)
GFR calc Af Amer: 56 mL/min — ABNORMAL LOW (ref >60)
GFR calc non Af Amer: 48 mL/min — ABNORMAL LOW (ref >60)
Glucose, Bld: 127 mg/dL — ABNORMAL HIGH (ref 70–99)
Phosphorus: 2.6 mg/dL (ref 2.5–4.6)
Potassium: 3.8 mmol/L (ref 3.5–5.1)
Sodium: 139 mmol/L (ref 135–145)

## 2019-10-26 LAB — BASIC METABOLIC PANEL
Anion gap: 8 (ref 5–15)
BUN: 25 mg/dL — ABNORMAL HIGH (ref 6–20)
CO2: 23 mmol/L (ref 22–32)
Calcium: 8.8 mg/dL — ABNORMAL LOW (ref 8.9–10.3)
Chloride: 106 mmol/L (ref 98–111)
Creatinine, Ser: 1.43 mg/dL — ABNORMAL HIGH (ref 0.44–1.00)
GFR calc Af Amer: 49 mL/min — ABNORMAL LOW (ref >60)
GFR calc non Af Amer: 43 mL/min — ABNORMAL LOW (ref >60)
Glucose, Bld: 192 mg/dL — ABNORMAL HIGH (ref 70–99)
Potassium: 3.8 mmol/L (ref 3.5–5.1)
Sodium: 137 mmol/L (ref 135–145)

## 2019-10-26 LAB — GLUCOSE, CAPILLARY
Glucose-Capillary: 137 mg/dL — ABNORMAL HIGH (ref 70–99)
Glucose-Capillary: 203 mg/dL — ABNORMAL HIGH (ref 70–99)
Glucose-Capillary: 175 mg/dL — ABNORMAL HIGH (ref 70–99)
Glucose-Capillary: 83 mg/dL (ref 70–99)
Glucose-Capillary: 104 mg/dL — ABNORMAL HIGH (ref 70–99)

## 2019-10-26 LAB — CBC
HCT: 24.7 % — ABNORMAL LOW (ref 36.0–46.0)
Hemoglobin: 7.8 g/dL — ABNORMAL LOW (ref 12.0–15.0)
MCH: 28 pg (ref 26.0–34.0)
MCHC: 31.6 g/dL (ref 30.0–36.0)
MCV: 88.5 fL (ref 80.0–100.0)
Platelets: 102 10*3/uL — ABNORMAL LOW (ref 150–400)
RBC: 2.79 MIL/uL — ABNORMAL LOW (ref 3.87–5.11)
RDW: 12.9 % (ref 11.5–15.5)
WBC: 7.2 10*3/uL (ref 4.0–10.5)
nRBC: 0 % (ref 0.0–0.2)

## 2019-10-26 LAB — PREPARE RBC (CROSSMATCH)

## 2019-10-26 LAB — TYPE AND SCREEN
ABO/RH(D): O POS
Antibody Screen: NEGATIVE

## 2019-10-26 MED ORDER — INSULIN DETEMIR 100 UNIT/ML ~~LOC~~ SOLN
10.0000 [IU] | Freq: Two times a day (BID) | SUBCUTANEOUS | Status: DC
  Administered 2019-10-26 – 2019-10-29 (×7): 10 [IU] via SUBCUTANEOUS
  Filled 2019-10-26 (×10): qty 0.1

## 2019-10-26 MED ORDER — METOLAZONE 5 MG PO TABS
5.0000 mg | ORAL_TABLET | Freq: Once | ORAL | Status: AC
  Administered 2019-10-26: 5 mg via ORAL
  Filled 2019-10-26: qty 1

## 2019-10-26 MED ORDER — FUROSEMIDE 10 MG/ML IJ SOLN
40.0000 mg | Freq: Every day | INTRAMUSCULAR | Status: DC
  Administered 2019-10-26 – 2019-10-27 (×2): 40 mg via INTRAVENOUS
  Filled 2019-10-26 (×2): qty 4

## 2019-10-26 MED ORDER — NOREPINEPHRINE 4 MG/250ML-% IV SOLN: 2.0000 ug/min | INTRAVENOUS | Status: DC

## 2019-10-26 MED ORDER — INSULIN ASPART 100 UNIT/ML ~~LOC~~ SOLN
0.0000 [IU] | Freq: Three times a day (TID) | SUBCUTANEOUS | Status: DC
  Administered 2019-10-26: 4 [IU] via SUBCUTANEOUS
  Administered 2019-10-27: 2 [IU] via SUBCUTANEOUS
  Administered 2019-10-27: 4 [IU] via SUBCUTANEOUS
  Administered 2019-10-28: 2 [IU] via SUBCUTANEOUS
  Administered 2019-10-28: 4 [IU] via SUBCUTANEOUS
  Administered 2019-10-28: 2 [IU] via SUBCUTANEOUS
  Administered 2019-10-28 – 2019-10-29 (×2): 4 [IU] via SUBCUTANEOUS

## 2019-10-26 MED ORDER — LIVING WELL WITH DIABETES BOOK
Freq: Once | Status: AC
  Filled 2019-10-26: qty 1

## 2019-10-26 NOTE — Progress Notes (Signed)
Inpatient Diabetes Program Recommendations  AACE/ADA: New Consensus Statement on Inpatient Glycemic Control (2015)  Target Ranges:  Prepandial:   less than 140 mg/dL      Peak postprandial:   less than 180 mg/dL (1-2 hours)      Critically ill patients:  140 - 180 mg/dL   Lab Results  Component Value Date   GLUCAP 203 (H) 10/26/2019   HGBA1C 15.1 (H) 10/20/2019    Review of Glycemic Control Results for [name], [name] (MRN [id number]) as of 10/26/2019 09:45  Ref. Range 10/25/2019 16:16 10/25/2019 19:51 10/25/2019 23:42 10/26/2019 03:35 10/26/2019 08:39  Glucose-Capillary Latest Ref Range: 70 - 99 mg/dL 259 (H) 563 (H) 875 (H) 137 (H) 203 (H)   Diabetes history: DM2 Outpatient Diabetes medications: Levemir 50 units bid, Victoza 1.8 mg QPM, metformin 1000 mg QAM Current orders for Inpatient glycemic control: Levemir 10 units bid + Novolog 4 units tid if eats 50% meals + Novolog correction tid + hs 0-24 units  Inpatient Diabetes Program Recommendations:   Will plan to speak with patient @ bedside regarding importance of glycemic control and discuss A1c 15.1 (average blood glucose 387 over the past 2-3 months). DM coordinator spoke with patient on prior admission 06/14/19 and discussed A1c of 12.2. @ this time, patient states difficulty remembering to take the hs Levemir.  Lantus insulin once daily may be a better option for her to remember to administer. Ordered Living Well With Diabetes book for patient to review.  Thank you, Billy Fischer. Meghan Deavers, RN, MSN, CDE  Diabetes Coordinator Inpatient Glycemic Control Team Team Pager [phone] (8am-5pm) 10/26/2019 11:56 AM

## 2019-10-26 NOTE — Progress Notes (Addendum)
[facility] DAILY ICU PROGRESS NOTE                   [address]            ,[place] [address]          [phone]   2 Days Post-Op Procedure(s) (LRB): CORONARY ARTERY BYPASS GRAFTING (CABG) times four, using left radial artery harvested endoscopically and right greater saphenous vein harvested endoscopically. (N/A) TRANSESOPHAGEAL ECHOCARDIOGRAM (TEE) (N/A) Radial Artery Harvest (Left)  Total Length of Stay:  LOS: 2 days   Subjective: Fells pretty well, some incisional discomforts Ambulated yesterday evening in unit  Objective: Vital signs in last 24 hours: Temp:  [97.7 F (36.5 C)-99.5 F (37.5 C)] 98.2 F (36.8 C) (02/24 2346) Pulse Rate:  [70-104] 95 (02/25 0815) Cardiac Rhythm: Normal sinus rhythm (02/25 0800) Resp:  [9-35] 20 (02/25 0815) BP: (70-130)/(40-82) 119/57 (02/25 0815) SpO2:  [86 %-100 %] 97 % (02/25 0815) Arterial Line BP: (72-111)/(29-50) 94/41 (02/24 1645) Weight:  [116.1 kg] 116.1 kg (02/25 0400)  Filed Weights   10/24/19 0555 10/25/19 0500 10/26/19 0400  Weight: 104 kg 115.5 kg 116.1 kg    Weight change: 0.621 kg   Hemodynamic parameters for last 24 hours: PAP: (22-34)/(8-20) 25/8 CO:  [6.3 L/min] 6.3 L/min CI:  [2.9 L/min/m2] 2.9 L/min/m2  Intake/Output from previous day: 02/24 0701 - 02/25 0700 In: 1777.2 [I.V.:1142.2; IV Piggyback:635] Out: 1520 [Urine:1280; Chest Tube:240]  Intake/Output this shift: Total I/O In: 301.1 [I.V.:282.7; IV Piggyback:18.4] Out: 120 [Urine:60; Chest Tube:60]  Current Meds: Scheduled Meds: . sodium chloride   Intravenous Once  . acetaminophen  1,000 mg Oral Q6H   Or  . acetaminophen (TYLENOL) oral liquid 160 mg/5 mL  1,000 mg Per Tube Q6H  . aspirin EC  325 mg Oral Daily   Or  . aspirin  324 mg Per Tube Daily  . atorvastatin  80 mg Oral q1800  . bisacodyl  10 mg Oral Daily   Or  . bisacodyl  10 mg Rectal Daily  . Chlorhexidine Gluconate Cloth  6 each Topical Daily  . docusate sodium   200 mg Oral Daily  . enoxaparin (LOVENOX) injection  40 mg Subcutaneous QHS  . ezetimibe  10 mg Oral QPM  . ferrous [id number] C-folic acid  1 capsule Oral BID PC  . FLUoxetine  40 mg Oral Daily  . furosemide  40 mg Intravenous Daily  . insulin aspart  0-24 Units Subcutaneous Q4H  . insulin aspart  4 Units Subcutaneous TID WC  . insulin detemir  10 Units Subcutaneous BID  . isosorbide mononitrate  15 mg Oral Daily  . mouth rinse  15 mL Mouth Rinse BID  . metolazone  5 mg Oral Once  . pantoprazole  40 mg Oral Daily  . sodium chloride flush  10-40 mL Intracatheter Q12H  . sodium chloride flush  3 mL Intravenous Q12H   Continuous Infusions: . sodium chloride    . sodium chloride    . sodium chloride Stopped (10/26/19 0753)  . lactated ringers    . lactated ringers 20 mL/hr at 10/26/19 0800  . norepinephrine (LEVOPHED) Adult infusion 4 mcg/min (10/25/19 1836)   PRN Meds:.sodium chloride, dextrose, fentaNYL (SUBLIMAZE) injection, fluticasone, levalbuterol, metoprolol tartrate, midazolam, ondansetron (ZOFRAN) IV, oxyCODONE, sodium chloride flush, sodium chloride flush, traMADol  General appearance: alert, cooperative and no distress Heart: regular rate and rhythm Lungs: mildly dim in lower fields Abdomen: benign Extremities: minor edema Wound: dressings  CDI, L hand N/V intact  Lab Results: CBC: Recent Labs    10/25/19 1622 10/26/19 0426  WBC 7.5 7.2  HGB 7.8* 7.8*  HCT 24.4* 24.7*  PLT 102* 102*   BMET:  Recent Labs    10/25/19 1622 10/26/19 0426  NA 135 137  K 4.1 3.8  CL 105 106  CO2 22 23  GLUCOSE 193* 192*  BUN 23* 25*  CREATININE 1.20* 1.43*  CALCIUM 8.4* 8.8*    CMET: Lab Results  Component Value Date   WBC 7.2 10/26/2019   HGB 7.8 (L) 10/26/2019   HCT 24.7 (L) 10/26/2019   PLT 102 (L) 10/26/2019   GLUCOSE 192 (H) 10/26/2019   CHOL 164 10/02/2019   TRIG 164 (H) 10/02/2019   HDL 58 10/02/2019   LDLCALC 78 10/02/2019   ALT 19 10/20/2019    AST 15 10/20/2019   NA 137 10/26/2019   K 3.8 10/26/2019   CL 106 10/26/2019   CREATININE 1.43 (H) 10/26/2019   BUN 25 (H) 10/26/2019   CO2 23 10/26/2019   INR 1.4 (H) 10/24/2019   HGBA1C 15.1 (H) 10/20/2019   MICROALBUR 0.96 06/02/2010      PT/INR:  Recent Labs    10/24/19 1612  LABPROT 16.8*  INR 1.4*   Radiology: [place] EKG SITE RITE  Result Date: 10/26/2019 If Site Rite image not attached, placement could not be confirmed due to current cardiac rhythm.    Assessment/Plan: S/P Procedure(s) (LRB): CORONARY ARTERY BYPASS GRAFTING (CABG) times four, using left radial artery harvested endoscopically and right greater saphenous vein harvested endoscopically. (N/A) TRANSESOPHAGEAL ECHOCARDIOGRAM (TEE) (N/A) Radial Artery Harvest (Left)  1 hemodyn stable on low dose levo- wean as able- Co-Ox 62.3. afebrile 2 sats good on 2 liters 3 CXR small effusons 4 some volume overload, begin diuresis, monitor renal fxn as creat bumped to 1.43 5 CT drainage 240 cc yesterday- d/c today 6 ABL anemia, stable but close to transfusion threshold if gets symptomatic- cont trinsicon 7 BS control is fair, starting levemir 8 push rehab as able/routine pulm toilet  Rowe Clack PA-C 10/26/2019 8:27 AM  Pager [phone]  Blood pressure and co-ox better today- slow wean off pressors DC chest tubes and sleeve\ Wt still up - cont lasix but follow creat- dc tramadol Keep in ICU today for observation while weaning pressors Resume low dose beta blocker tomorrow  patient examined and medical record reviewed,agree with above note. Kathlee Nations Trigt III 10/26/2019

## 2019-10-26 NOTE — Plan of Care (Signed)
  Problem: Respiratory: Goal: Respiratory status will improve Outcome: Completed/Met   Problem: Skin Integrity: Goal: Wound healing without signs and symptoms of infection Outcome: Completed/Met   Problem: Urinary Elimination: Goal: Ability to achieve and maintain adequate renal perfusion and functioning will improve Outcome: Completed/Met

## 2019-10-26 NOTE — Progress Notes (Signed)
[facility] Evening Rounds  POD #2 s/p CABG No complaints HD stable BP 130/76   Pulse 72   Temp (!) 97.5 F (36.4 C) (Oral)   Resp 17   Ht 5\' 8"  (1.727 m)   Wt 116.1 kg   LMP 10/03/2011   SpO2 95%   BMI 38.92 kg/m  Exam unremarkable; alert/oriented   Intake/Output Summary (Last 24 hours) at 10/26/2019 1856 Last data filed at 10/26/2019 1700 Gross per 24 hour  Intake 1639.18 ml  Output 1575 ml  Net 64.18 ml    A/P: Continue early post operative care. Kalis Friese Z. 10/28/2019, MD [phone]

## 2019-10-27 ENCOUNTER — Inpatient Hospital Stay (HOSPITAL_COMMUNITY): Payer: 59

## 2019-10-27 LAB — BASIC METABOLIC PANEL
Anion gap: 11 (ref 5–15)
BUN: 24 mg/dL — ABNORMAL HIGH (ref 6–20)
CO2: 25 mmol/L (ref 22–32)
Calcium: 9.2 mg/dL (ref 8.9–10.3)
Chloride: 101 mmol/L (ref 98–111)
Creatinine, Ser: 1.19 mg/dL — ABNORMAL HIGH (ref 0.44–1.00)
GFR calc Af Amer: >60 mL/min (ref >60)
GFR calc non Af Amer: 53 mL/min — ABNORMAL LOW (ref >60)
Glucose, Bld: 125 mg/dL — ABNORMAL HIGH (ref 70–99)
Potassium: 3.8 mmol/L (ref 3.5–5.1)
Sodium: 137 mmol/L (ref 135–145)

## 2019-10-27 LAB — CBC
HCT: 30 % — ABNORMAL LOW (ref 36.0–46.0)
Hemoglobin: 9.9 g/dL — ABNORMAL LOW (ref 12.0–15.0)
MCH: 28.4 pg (ref 26.0–34.0)
MCHC: 33 g/dL (ref 30.0–36.0)
MCV: 86 fL (ref 80.0–100.0)
Platelets: 139 10*3/uL — ABNORMAL LOW (ref 150–400)
RBC: 3.49 MIL/uL — ABNORMAL LOW (ref 3.87–5.11)
RDW: 13.3 % (ref 11.5–15.5)
WBC: 8.1 10*3/uL (ref 4.0–10.5)
nRBC: 0 % (ref 0.0–0.2)

## 2019-10-27 LAB — GLUCOSE, CAPILLARY
Glucose-Capillary: 89 mg/dL (ref 70–99)
Glucose-Capillary: 176 mg/dL — ABNORMAL HIGH (ref 70–99)
Glucose-Capillary: 112 mg/dL — ABNORMAL HIGH (ref 70–99)
Glucose-Capillary: 144 mg/dL — ABNORMAL HIGH (ref 70–99)
Glucose-Capillary: 90 mg/dL (ref 70–99)

## 2019-10-27 LAB — COOXEMETRY PANEL
Carboxyhemoglobin: 1.4 % (ref 0.5–1.5)
Methemoglobin: 0.5 % (ref 0.0–1.5)
O2 Saturation: 53.2 %
Total hemoglobin: 9.1 g/dL — ABNORMAL LOW (ref 12.0–16.0)

## 2019-10-27 MED ORDER — HYDROMORPHONE HCL 2 MG PO TABS
2.0000 mg | ORAL_TABLET | Freq: Four times a day (QID) | ORAL | Status: DC | PRN
  Administered 2019-10-27 – 2019-10-29 (×5): 2 mg via ORAL
  Filled 2019-10-27 (×5): qty 1

## 2019-10-27 MED ORDER — PROMETHAZINE HCL 25 MG/ML IJ SOLN: 12.5000 mg | Freq: Four times a day (QID) | INTRAMUSCULAR | Status: DC | PRN

## 2019-10-27 MED ORDER — FUROSEMIDE 40 MG PO TABS: 40.0000 mg | ORAL_TABLET | Freq: Every day | ORAL | Status: DC

## 2019-10-27 MED ORDER — PROMETHAZINE HCL 25 MG/ML IJ SOLN
6.2500 mg | INTRAMUSCULAR | Status: DC | PRN
  Administered 2019-10-27: 6.25 mg via INTRAVENOUS
  Filled 2019-10-27: qty 1

## 2019-10-27 MED ORDER — SORBITOL 70 % PO SOLN
30.0000 mL | Freq: Every morning | ORAL | Status: DC
  Filled 2019-10-27: qty 30

## 2019-10-27 MED ORDER — GABAPENTIN 400 MG PO CAPS
400.0000 mg | ORAL_CAPSULE | Freq: Three times a day (TID) | ORAL | Status: DC
  Administered 2019-10-27 – 2019-10-29 (×7): 400 mg via ORAL
  Filled 2019-10-27 (×7): qty 1

## 2019-10-27 MED ORDER — ENOXAPARIN SODIUM 40 MG/0.4ML ~~LOC~~ SOLN
40.0000 mg | SUBCUTANEOUS | Status: DC
  Administered 2019-10-27 – 2019-10-28 (×2): 40 mg via SUBCUTANEOUS
  Filled 2019-10-27 (×2): qty 0.4

## 2019-10-27 MED ORDER — SORBITOL 70 % SOLN
30.0000 mL | Freq: Every day | Status: AC
  Administered 2019-10-27 – 2019-10-28 (×2): 30 mL via ORAL
  Filled 2019-10-27 (×2): qty 30

## 2019-10-27 MED ORDER — CARVEDILOL 6.25 MG PO TABS: 6.2500 mg | ORAL_TABLET | Freq: Two times a day (BID) | ORAL | Status: DC

## 2019-10-27 MED ORDER — SODIUM CHLORIDE 0.9% FLUSH: 10.0000 mL | INTRAVENOUS | Status: DC | PRN

## 2019-10-27 MED FILL — Sodium Bicarbonate IV Soln 8.4%: INTRAVENOUS | Qty: 50 | Status: AC

## 2019-10-27 MED FILL — Vancomycin HCl-Dextrose IV Soln 1 GM/200ML-5%: INTRAVENOUS | Qty: 200 | Status: AC

## 2019-10-27 MED FILL — Magnesium Sulfate Inj 50%: INTRAMUSCULAR | Qty: 10 | Status: AC

## 2019-10-27 MED FILL — Sodium Chloride IV Soln 0.9%: INTRAVENOUS | Qty: 2000 | Status: AC

## 2019-10-27 MED FILL — Potassium Chloride Inj 2 mEq/ML: INTRAVENOUS | Qty: 40 | Status: AC

## 2019-10-27 MED FILL — Mannitol IV Soln 20%: INTRAVENOUS | Qty: 500 | Status: AC

## 2019-10-27 MED FILL — Electrolyte-R (PH 7.4) Solution: INTRAVENOUS | Qty: 6000 | Status: AC

## 2019-10-27 MED FILL — Heparin Sodium (Porcine) Inj 1000 Unit/ML: INTRAMUSCULAR | Qty: 30 | Status: AC

## 2019-10-27 MED FILL — Heparin Sodium (Porcine) Inj 1000 Unit/ML: INTRAMUSCULAR | Qty: 10 | Status: AC

## 2019-10-27 MED FILL — Lidocaine HCl Local Soln Prefilled Syringe 100 MG/5ML (2%): INTRAMUSCULAR | Qty: 5 | Status: AC

## 2019-10-27 NOTE — Discharge Summary (Addendum)
Physician Discharge Summary  Patient ID: [name] MRN: [id number] DOB/AGE: 1971-04-28 49 y.o.  Admit date: 10/24/2019 Discharge date: 10/29/2019  Admission Diagnoses: severe CAD  Discharge Diagnoses:  Active Problems:   S/P CABG x 4  Patient Active Problem List   Diagnosis Date Noted   Diabetic retinopathy ([place]) 10/25/2019   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   Angina pectoris ([place]) 10/06/2019   Left-sided chest pain 06/13/2019   Post-operative state 06/06/2018   HGSIL (high grade squamous intraepithelial lesion) on Pap smear of cervix 02/25/2018   Right upper quadrant abdominal pain 12/30/2017   Polyarthritis 12/30/2017   OSA (obstructive sleep apnea) 11/22/2013   Dyspnea 11/22/2013   Uncontrolled type 2 diabetes mellitus with hyperosmolar nonketotic hyperglycemia ([place]) 07/21/2012   HTN (hypertension) 07/21/2012   Smoker 07/21/2012   HPI: at time of admission [name] is a 49 y.o. female with a hx of coronary artery disease, type 2 diabetes, hypertension, hyperlipidemia who presents for a follow-up evaluation.  Patient was admitted to [facility] on 06/13/19 with chest pain.  EKG was unremarkable and high-sensitivity troponins were negative.  However due to her extensive risk factors including uncontrolled diabetes, coronary CTA was ordered.  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  She was discharged on medical management: atorvastatin 80 mg, Imdur 30 mg.  TTE showed EF 60-65%.   During subsequent office visits, her antianginal regimen was titrated as she continued to report chest pain.  At last clinic visit on 10/02/2019, she was on metoprolol 25 mg twice daily and Imdur 60 mg  daily, but further titration was limited by low blood pressures.  Given inability to further titrate up antianginals and having persistent chest pain, cardiac catheterization was ordered.  Underwent cath on 10/06/2019, which showed three-vessel CAD: 80% mid LAD, 85% slitlike ostial D1, 80% ostial D2, 80% small PDA, anomalous circumflex off the RCA cusp with moderate diffuse disease.  Recommendation was for aggressive medical management and if symptoms persist consider surgical consultation.   Since her heart catheterization, she reports that she continues to have chest pain.  Reports that it is occurring daily, typically uses 1 nitroglycerin per day.  Yesterday had to use 3 nitroglycerin for her chest pain.  Also reports that she has been having dizzy spells.  Denies any dizziness or lightheadedness with position change, but notes that certain head movements cause her to feel like the room is spinning.  Last week was walking, bent her head down to look at her phone and then felt dizzy and fell down 3 stairs, bruising her knee.  Cardiothoracic surgical consultation was obtained with Dahlia Byes MD who evaluated the patient and studies and agreed with recommendations to pursue CABG as her best surgical revascularization option due to the severity of her disease.   Discharged Condition: good  Hospital Course: The patient was admitted electively and on 10/24/2019 taken the operating room where she underwent the below described procedure.  She tolerated well was taken to the surgical intensive care unit in stable condition.  Postoperative hospital course:  The patient has done well overall.  She initially did require some inotropic support this was weaned without difficulty.  She was also weaned from the  ventilator without difficulty using standard protocols.  She did develop some postoperative volume overload diuretics for initiated.  She did develop some acute renal insufficiency Lasix dosing had to be  adjusted over time.  All routine lines, monitors and drainage devices have been discontinued in the standard fashion.  She did develop a postoperative acute blood loss anemia which stabilized was also started on iron.  Most recent hemoglobin hematocrit dated 10/29/2019 were 8.3 and 26.3 respectively.  Most recent BUN and creatinine same date are 21 and 1.24.  Repeat creatinine was 1.55.  Blood sugars have been under adequate control but it is notable how poor her diabetes has been managed long-term.  She did have a hemoglobin A1c of 15 and the diabetic coordinator assisted with postoperative teaching.  She does understand also she will need close follow-up with her primary care in regards to her diabetes long-term as it is a ongoing significant risk factor for further development of worsening coronary artery disease including of her bypasses.  She did have some postoperative pain symptoms and also some numbness in her left hand from the radial artery harvest which was showing slow but steady improvement.  There was some associated swelling associated with it as well which is also improving.  Her incisions are healing well without evidence of infection.  She is tolerating diet.  Oxygen was weaned and she maintains good saturations on room air.  At the time of discharge the patient was felt to be stable.   Consults: cardiology  Significant Diagnostic Studies: routine post op labs and serial chest xrays  Treatments: surgery:  OPERATIVE REPORT   DATE OF PROCEDURE:  10/24/2019   OPERATION: 1.  Coronary artery bypass grafting x4 (left internal mammary artery to left anterior descending, left radial artery graft to first diagonal, saphenous vein graft to diagonal, saphenous vein graft to posterior descending). 2.  Endoscopic harvest of left radial artery. 3.  Endoscopic harvest of left leg greater saphenous vein.   SURGEON:  Kerin Perna, MD   ASSISTANT:  Gershon Crane, PA-C   PREOPERATIVE DIAGNOSES:   Severe 3-vessel coronary artery disease with unstable angina.   POSTOPERATIVE DIAGNOSES:  Severe 3-vessel coronary artery disease with unstable angina.   ANESTHESIA:  General by Dr. Carita Pian.   Discharge Exam: Blood pressure 113/69, pulse 70, temperature 98.7 F (37.1 C), temperature source Oral, resp. rate 14, height 5\' 8"  (1.727 m), weight 113 kg, last menstrual period 10/03/2011, SpO2 94 %.  General appearance: alert, cooperative and no distress Heart: regular rate and rhythm Lungs: min dim in bases Abdomen: benigbenign, obese Extremities: L>R LE edema Wound: incis healing well, left hand with some numbness persist in radial nerve distribution   Disposition:  Discharge disposition: 01-Home or Self Care       Discharge Instructions     Amb Referral to Cardiac Rehabilitation   Complete by: As directed    Diagnosis: CABG   CABG X ___: 4   After initial evaluation and assessments completed: Virtual Based Care may be provided alone or in conjunction with Phase 2 Cardiac Rehab based on patient barriers.: Yes   Discharge patient   Complete by: As directed    Discharge disposition: 01-Home or Self Care   Discharge patient date: 10/29/2019      Allergies as of 10/29/2019       Reactions   Sulfa Antibiotics Hives   Codeine Itching        Medication List     STOP taking  these medications    metoprolol tartrate 25 MG tablet Commonly known as: LOPRESSOR   nitroGLYCERIN 0.4 MG SL tablet Commonly known as: NITROSTAT   ranolazine 500 MG 12 hr tablet Commonly known as: Ranexa       TAKE these medications    acetaminophen 500 MG tablet Commonly known as: TYLENOL Take 1,000 mg by mouth every 6 (six) hours as needed for moderate pain or headache.   aspirin 325 MG EC tablet Take 1 tablet (325 mg total) by mouth daily. What changed:  medication strength how much to take   atorvastatin 80 MG tablet Commonly known as: LIPITOR Take 1 tablet (80 mg total) by  mouth daily at 6 PM.   B-D UF III MINI PEN NEEDLES 31G X 5 MM Misc Generic drug: Insulin Pen Needle Use as instructed. Inject into the skin three times daily   carvedilol 3.125 MG tablet Commonly known as: COREG Take 1 tablet (3.125 mg total) by mouth 2 (two) times daily with a meal.   esomeprazole 20 MG capsule Commonly known as: NEXIUM Take 20 mg by mouth daily.   ezetimibe 10 MG tablet Commonly known as: ZETIA Take 1 tablet (10 mg total) by mouth daily. What changed: when to take this   ferrous fumarate-b12-vitamic C-folic acid capsule Commonly known as: TRINSICON / FOLTRIN Take 1 capsule by mouth 2 (two) times daily after a meal.   FLUoxetine 40 MG capsule Commonly known as: PROZAC Take one tab daily with a 20 mg daily What changed:  how much to take how to take this when to take this additional instructions   fluticasone 50 MCG/ACT nasal spray Commonly known as: FLONASE Place 2 sprays into both nostrils daily as needed for allergies or rhinitis.   furosemide 40 MG tablet Commonly known as: Lasix Take 1 tablet (40 mg total) by mouth daily.   gabapentin 300 MG capsule Commonly known as: NEURONTIN Take 3 capsules (900 mg total) by mouth 2 (two) times daily.   glucose blood test strip Commonly known as: True Metrix Blood Glucose Test Use as instructed   HYDROmorphone 2 MG tablet Commonly known as: DILAUDID Take 1 tablet (2 mg total) by mouth every 6 (six) hours as needed for up to 7 days for severe pain.   isosorbide mononitrate 30 MG 24 hr tablet Commonly known as: IMDUR Take 0.5 tablets (15 mg total) by mouth daily. What changed:  medication strength how much to take   Levemir FlexTouch 100 UNIT/ML Pen Generic drug: Insulin Detemir Inject 53 Units into the skin 2 (two) times daily. What changed: how much to take   Melatonin 10 MG Caps Take 10 mg by mouth at bedtime as needed (sleep).   metFORMIN 500 MG 24 hr tablet Commonly known as:  GLUCOPHAGE-XR Take 2 tablets (1,000 mg total) by mouth daily with breakfast.   potassium chloride 10 MEQ tablet Commonly known as: KLOR-CON Take 1 tablet (10 mEq total) by mouth daily.   TRUEplus Safety Lancets 28G Misc Use as directed   Victoza 18 MG/3ML Sopn Generic drug: liraglutide Inject 0.3 mLs (1.8 mg total) into the skin daily. What changed: when to take this       Follow-up Information     Kerin Perna, MD Follow up.   Specialty: Cardiothoracic Surgery Contact information: [address] [place] [address] [phone]         Little Ishikawa, MD Follow up.   Specialty: Cardiology Why: see discharge paperwork  for details of f/u with cardiology Contact information: [address] [place] [address] [phone]         Little Ishikawa, MD Follow up.   Specialty: Cardiology Why: see discharge paperwork for deyails of f/u with cardiology Contact information: [address] [place] [address] [phone]           The patient has been discharged on:   1.Beta Blocker:  Yes [place]ienne   ]                              No   [   ]                              If No, reason:  2.Ace Inhibitor/ARB: Yes [   ]                                     No  [   n ]                                     If No, reason:renal insuff.  3.Statin:   Yes [ y  ]                  No  [   ]                  If No, reason:  4.Ecasa:  Yes  [ y  ]                  No   [   ]                  If No, reason:  Signed: Rowe Clack 10/29/2019, 7:57 AM  DC instructions regarding activity limits, wound care , diet and medications discussed with patient  patient examined and medical record reviewed,agree with above note. Kathlee Nations Trigt III 10/30/2019

## 2019-10-27 NOTE — Progress Notes (Signed)
Inpatient Diabetes Program Recommendations  AACE/ADA: New Consensus Statement on Inpatient Glycemic Control (2015)  Target Ranges:  Prepandial:   less than 140 mg/dL      Peak postprandial:   less than 180 mg/dL (1-2 hours)      Critically ill patients:  140 - 180 mg/dL   Lab Results  Component Value Date   GLUCAP 176 (H) 10/27/2019   HGBA1C 15.1 (H) 10/20/2019    Review of Glycemic Control  Inpatient Diabetes Program Recommendations:   Spoke with pt about  A1C results 15.1 (average blood glucose 387 over the past 2-3 months) and explained what an A1C is, basic pathophysiology of DM Type 2, basic home care, basic diabetes diet nutrition principles, importance of checking CBGs and maintaining good CBG control to prevent long-term and short-term complications. Reviewed signs and symptoms of hyperglycemia and hypoglycemia and how to treat hypoglycemia at home. Also reviewed blood sugar goals at home.  RNs to provide ongoing basic DM education at bedside with this patient. Have ordered educational booklet and DM videos.  Patient showed [facility] and Endocrinologist. Patient verbalized understanding of risks due to elevated CBGs. Reviewed basic plate method and A1c with handouts with patient and she verbalized understanding. DM coordinator had spoken with patient on prior admission when A1c was 12.2 on 06/14/19.  Thank you, Billy Fischer. Luzelena Heeg, RN, MSN, CDE  Diabetes Coordinator Inpatient Glycemic Control Team Team Pager [phone] (8am-5pm) 10/27/2019 2:25 PM

## 2019-10-27 NOTE — Discharge Instructions (Signed)
Coronary Artery Bypass Grafting, Care After °This sheet gives you information about how to care for yourself after your procedure. Your doctor may also give you more specific instructions. If you have problems or questions, call your doctor. °What can I expect after the procedure? °After the procedure, it is common to: °· Feel sick to your stomach (nauseous). °· Not want to eat as much as normal (lack of appetite). °· Have trouble pooping (constipation). °· Have weakness and tiredness (fatigue). °· Feel sad (depressed) or grouchy (irritable). °· Have pain or discomfort around the cuts from surgery (incisions). °Follow these instructions at home: °Medicines °· Take over-the-counter and prescription medicines only as told by your doctor. Do not stop taking medicines or start any new medicines unless your doctor says it is okay. °· If you were prescribed an antibiotic medicine, take it as told by your doctor. Do not stop taking the antibiotic even if you start to feel better. °Incision care ° °· Follow instructions from your doctor about how to take care of your cuts from surgery. Make sure you: °? Wash your hands with soap and water before and after you change your bandage (dressing). If you cannot use soap and water, use hand sanitizer. °? Change your bandage as told by your doctor. °? Leave stitches (sutures), skin glue, or skin tape (adhesive) strips in place. They may need to stay in place for 2 weeks or longer. If tape strips get loose and curl up, you may trim the loose edges. Do not remove tape strips completely unless your doctor says it is okay. °· Make sure the surgery cuts are clean, dry, and protected. °· Check your cut areas every day for signs of infection. Check for: °? More redness, swelling, or pain. °? More fluid or blood. °? Warmth. °? Pus or a bad smell. °· If cuts were made in your legs: °? Avoid crossing your legs. °? Avoid sitting for long periods of time. Change positions every 30  minutes. °? Raise (elevate) your legs when you are sitting. °Bathing °· Do not take baths, swim, or use a hot tub until your doctor says it is okay. °· You may shower. Pat the surgery cuts dry. Do not rub the cuts to dry. °Eating and drinking ° °· Eat foods that are high in fiber, such as beans, nuts, whole grains, and raw fruits and vegetables. Any meats you eat should be lean cut. Avoid canned, processed, and fried foods. This can help prevent trouble pooping. This is also a part of a heart-healthy diet. °· Drink enough fluid to keep your pee (urine) pale yellow. °· Do not drink alcohol until you are fully recovered. Ask your doctor when it is safe to drink alcohol. °Activity °· Rest and limit your activity as told by your doctor. You may be told to: °? Stop any activity right away if you have chest pain, shortness of breath, irregular heartbeats, or dizziness. Get help right away if you have any of these symptoms. °? Move around often for short periods or take short walks as told by your doctor. Slowly increase your activities. °? Avoid lifting, pushing, or pulling anything that is heavier than 10 lb (4.5 kg) for at least 6 weeks or as told by your doctor. °· Do physical therapy or a cardiac rehab (cardiac rehabilitation) program as told by your doctor. °? Physical therapy involves doing exercises to maintain movement and build strength and endurance. °? A cardiac rehab program includes: °§ Exercise training. °§   Education. °§ Counseling. °· Do not drive until your doctor says it is okay. °· Ask your doctor when you can go back to work. °· Ask your doctor when you can be sexually active. °General instructions °· Do not drive or use heavy machinery while taking prescription pain medicine. °· Do not use any products that contain nicotine or tobacco. These include cigarettes, e-cigarettes, and chewing tobacco. If you need help quitting, ask your doctor. °· Take 2-3 deep breaths every few hours during the day while  you get better. This helps expand your lungs and prevent problems. °· If you were given a device called an incentive spirometer, use it several times a day to practice deep breathing. Support your chest with a pillow or your arms when you take deep breaths or cough. °· Wear compression stockings as told by your doctor. °· Weigh yourself every day. This helps to see if your body is holding (retaining) fluid that may make your heart and lungs work harder. °· Keep all follow-up visits as told by your doctor. This is important. °Contact a doctor if: °· You have more redness, swelling, or pain around any cut. °· You have more fluid or blood coming from any cut. °· Any cut feels warm to the touch. °· You have pus or a bad smell coming from any cut. °· You have a fever. °· You have swelling in your ankles or legs. °· You have pain in your legs. °· You gain 2 lb (0.9 kg) or more a day. °· You feel sick to your stomach or you throw up (vomit). °· You have watery poop (diarrhea). °Get help right away if: °· You have chest pain that goes to your jaw or arms. °· You are short of breath. °· You have a fast or irregular heartbeat. °· You notice a "clicking" in your breastbone (sternum) when you move. °· You have any signs of a stroke. "BE FAST" is an easy way to remember the main warning signs: °? B - Balance. Signs are dizziness, sudden trouble walking, or loss of balance. °? E - Eyes. Signs are trouble seeing or a change in how you see. °? F - Face. Signs are sudden weakness or loss of feeling of the face, or the face or eyelid drooping on one side. °? A - Arms. Signs are weakness or loss of feeling in an arm. This happens suddenly and usually on one side of the body. °? S - Speech. Signs are sudden trouble speaking, slurred speech, or trouble understanding what people say. °? T - Time. Time to call emergency services. Write down what time symptoms started. °· You have other signs of a stroke, such as: °? A sudden, very bad  headache with no known cause. °? Feeling sick to your stomach. °? Throwing up. °? Jerky movements you cannot control (seizure). °These symptoms may be an emergency. Do not wait to see if the symptoms will go away. Get medical help right away. Call your local emergency services (911 in the [place] Do not drive yourself to the hospital. °Summary °· After the procedure, it is common to have pain or discomfort in the cuts from surgery (incisions). °· Do not take baths, swim, or use a hot tub until your doctor says it is okay. °· Slowly increase your activities. You may need physical therapy or cardiac rehab. °· Weigh yourself every day. This helps to see if your body is holding fluid. °This information is not intended to replace   advice given to you by your health care provider. Make sure you discuss any questions you have with your health care provider. °Document Revised: 04/26/2018 Document Reviewed: 04/26/2018 °Elsevier Patient Education © 2020 [employer] ° °Endoscopic Saphenous Vein Harvesting, Care After °This sheet gives you information about how to care for yourself after your procedure. Your health care provider may also give you more specific instructions. If you have problems or questions, contact your health care provider. °What can I expect after the procedure? °After the procedure, it is common to have: °· Pain. °· Bruising. °· Swelling. °· Numbness. °Follow these instructions at home: °Incision care ° °· Follow instructions from your health care provider about how to take care of your incisions. Make sure you: °? Wash your hands with soap and water before and after you change your bandages (dressings). If soap and water are not available, use hand sanitizer. °? Change your dressings as told by your health care provider. °? Leave stitches (sutures), skin glue, or adhesive strips in place. These skin closures may need to stay in place for 2 weeks or longer. If adhesive strip edges start to loosen and curl  up, you may trim the loose edges. Do not remove adhesive strips completely unless your health care provider tells you to do that. °· Check your incision areas every day for signs of infection. Check for: °? More redness, swelling, or pain. °? Fluid or blood. °? Warmth. °? Pus or a bad smell. °Medicines °· Take over-the-counter and prescription medicines only as told by your health care provider. °· Ask your health care provider if the medicine prescribed to you requires you to avoid driving or using heavy machinery. °General instructions °· Raise (elevate) your legs above the level of your heart while you are sitting or lying down. °· Avoid crossing your legs. °· Avoid sitting for long periods of time. Change positions every 30 minutes. °· Do any exercises your health care providers have given you. These may include deep breathing, coughing, and walking exercises. °· Do not take baths, swim, or use a hot tub until your health care provider approves. Ask your health care provider if you may take showers. You may only be allowed to take sponge baths. °· Wear compression stockings as told by your health care provider. These stockings help to prevent blood clots and reduce swelling in your legs. °· Keep all follow-up visits as told by your health care provider. This is important. °Contact a health care provider if: °· Medicine does not help your pain. °· Your pain gets worse. °· You have new leg bruises or your leg bruises get bigger. °· Your leg feels numb. °· You have more redness, swelling, or pain around your incision. °· You have fluid or blood coming from your incision. °· Your incision feels warm to the touch. °· You have pus or a bad smell coming from your incision. °· You have a fever. °Get help right away if: °· Your pain is severe. °· You develop pain, tenderness, warmth, redness, or swelling in any part of your leg. °· You have chest pain. °· You have trouble breathing. °Summary °· Raise (elevate) your legs  above the level of your heart while you are sitting or lying down. °· Wear compression stockings as told by your health care provider. °· Make sure you know which symptoms should prompt you to contact your health care provider. °· Keep all follow-up visits as told by your health care provider. °This information   is not intended to replace advice given to you by your health care provider. Make sure you discuss any questions you have with your health care provider. °Document Revised: 07/25/2018 Document Reviewed: 07/25/2018 °Elsevier Patient Education © 2020 [employer] ° °

## 2019-10-27 NOTE — Progress Notes (Addendum)
[address]       [name] [address]             [phone]      3 Days Post-Op Procedure(s) (LRB): CORONARY ARTERY BYPASS GRAFTING (CABG) times four, using left radial artery harvested endoscopically and right greater saphenous vein harvested endoscopically. (N/A) TRANSESOPHAGEAL ECHOCARDIOGRAM (TEE) (N/A) Radial Artery Harvest (Left) Subjective: Primary c/o is pain and nausea  Objective: Vital signs in last 24 hours: Temp:  [97.5 F (36.4 C)-98.3 F (36.8 C)] 98.3 F (36.8 C) (02/26 0423) Pulse Rate:  [65-97] 74 (02/26 0600) Cardiac Rhythm: Normal sinus rhythm (02/26 0000) Resp:  [11-27] 19 (02/26 0600) BP: (84-131)/(43-89) 111/64 (02/26 0600) SpO2:  [86 %-100 %] 90 % (02/26 0600) Weight:  [643 kg] 115 kg (02/26 0500)  Hemodynamic parameters for last 24 hours:    Intake/Output from previous day: 02/25 0701 - 02/26 0700 In: 949.6 [I.V.:490.3; Blood:359.3; IV Piggyback:100] Out: 1920 [Urine:1860; Chest Tube:60] Intake/Output this shift: No intake/output data recorded.  General appearance: alert, cooperative and no distress Heart: regular rate and rhythm Lungs: mildly dim in bases Abdomen: benign Extremities: min edema Wound: incis healing well EVH/ERH, some left hand numbness. Chest dressing in place  Lab Results: Recent Labs    10/26/19 0426 10/27/19 0513  WBC 7.2 8.1  HGB 7.8* 9.9*  HCT 24.7* 30.0*  PLT 102* 139*   BMET:  Recent Labs    10/26/19 1800 10/27/19 0513  NA 139 137  K 3.8 3.8  CL 105 101  CO2 25 25  GLUCOSE 127* 125*  BUN 25* 24*  CREATININE 1.29* 1.19*  CALCIUM 8.8* 9.2    PT/INR:  Recent Labs    10/24/19 1612  LABPROT 16.8*  INR 1.4*   ABG    Component Value Date/Time   PHART 7.307 (L) 10/24/2019 2238   HCO3 22.2 10/24/2019 2238   TCO2 24 10/24/2019 2238   ACIDBASEDEF 4.0 (H) 10/24/2019 2238   O2SAT 53.2 10/27/2019 0535   CBG (last 3)  Recent Labs    10/26/19 1128 10/26/19 1538  10/26/19 2227  GLUCAP 175* 83 104*    Meds Scheduled Meds: . sodium chloride   Intravenous Once  . acetaminophen  1,000 mg Oral Q6H   Or  . acetaminophen (TYLENOL) oral liquid 160 mg/5 mL  1,000 mg Per Tube Q6H  . aspirin EC  325 mg Oral Daily   Or  . aspirin  324 mg Per Tube Daily  . atorvastatin  80 mg Oral q1800  . bisacodyl  10 mg Oral Daily   Or  . bisacodyl  10 mg Rectal Daily  . Chlorhexidine Gluconate Cloth  6 each Topical Daily  . docusate sodium  200 mg Oral Daily  . enoxaparin (LOVENOX) injection  40 mg Subcutaneous QHS  . ezetimibe  10 mg Oral QPM  . ferrous fumarate-b12-vitamic C-folic acid  1 capsule Oral BID PC  . FLUoxetine  40 mg Oral Daily  . furosemide  40 mg Intravenous Daily  . insulin aspart  0-24 Units Subcutaneous TID WC & HS  . insulin aspart  4 Units Subcutaneous TID WC  . insulin detemir  10 Units Subcutaneous BID  . isosorbide mononitrate  15 mg Oral Daily  . mouth rinse  15 mL Mouth Rinse BID  . pantoprazole  40 mg Oral Daily  . sodium chloride flush  10-40 mL Intracatheter Q12H  . sodium chloride flush  3 mL Intravenous Q12H  Continuous Infusions: . sodium chloride    . sodium chloride    . sodium chloride Stopped (10/26/19 0906)  . norepinephrine (LEVOPHED) Adult infusion Stopped (10/26/19 2100)   PRN Meds:.sodium chloride, dextrose, fentaNYL (SUBLIMAZE) injection, fluticasone, levalbuterol, metoprolol tartrate, midazolam, ondansetron (ZOFRAN) IV, oxyCODONE, sodium chloride flush, sodium chloride flush, traMADol  Xrays DG Chest Port 1 View  Result Date: 10/26/2019 CLINICAL DATA:  Chest tube, post CABG EXAM: PORTABLE CHEST 1 VIEW COMPARISON:  Portable exam 0553 hours compared to 10/25/2019 FINDINGS: Interval removal of Swan-Ganz catheter. Mediastinal drain and LEFT thoracostomy tube remain. RIGHT jugular Cordis catheter persists. Normal heart size post CABG. Atelectasis at LEFT base. No infiltrate, pleural effusion, or pneumothorax. Osseous  structures unremarkable. IMPRESSION: Postsurgical changes with persistent atelectasis at LEFT base. Electronically Signed   By: Lavonia Dana M.D.   On: 10/26/2019 08:59   [place] EKG SITE RITE  Result Date: 10/26/2019 If [facility] image not attached, placement could not be confirmed due to current cardiac rhythm.   Assessment/Plan: S/P Procedure(s) (LRB): CORONARY ARTERY BYPASS GRAFTING (CABG) times four, using left radial artery harvested endoscopically and right greater saphenous vein harvested endoscopically. (N/A) TRANSESOPHAGEAL ECHOCARDIOGRAM (TEE) (N/A) Radial Artery Harvest (Left)  1 hemodynamics doing well with gtts off, CO-OX a little low 53 2 sats ok on RA , a little low at times- adjust as needed, cont routine pulm toilet 3 H/H improved post transfusion 4 thrombocytopenia trend improved 5 will change from  zofran to phenergan for fairly persist nausea, change fenatanyl to dilaudid to see if that helps- she may be sensitive to all narcotics 6 renal fxn improved, cont diuresis 7 BS adeq control, gradual  transition to home meds as nausea and appetite improves 8 poss d/c central line if no pressors are resumed 9 poss tx to floor soon  LOS: 3 days    John Giovanni PA-C 10/27/2019 Pager [phone]  Transfer to 4E Transition to po lasix Big complaint now is nausea- phenergan ordered, and Nerve pain- gabapentin ordered in addition to low dose dilaudid  patient examined and medical record reviewed,agree with above note. Tharon Aquas Trigt III 10/27/2019

## 2019-10-28 ENCOUNTER — Inpatient Hospital Stay (HOSPITAL_COMMUNITY): Payer: 59

## 2019-10-28 LAB — TYPE AND SCREEN
ABO/RH(D): O POS
Antibody Screen: NEGATIVE
Unit division: 0
Unit division: 0
Unit division: 0
Unit division: 0

## 2019-10-28 LAB — BPAM RBC
Blood Product Expiration Date: 202103262359
Blood Product Expiration Date: 202103262359
Blood Product Expiration Date: 202103262359
Blood Product Expiration Date: 202103262359
ISSUE DATE / TIME: 202102230907
ISSUE DATE / TIME: 202102251430
Unit Type and Rh: 5100
Unit Type and Rh: 5100
Unit Type and Rh: 5100
Unit Type and Rh: 5100

## 2019-10-28 LAB — GLUCOSE, CAPILLARY
Glucose-Capillary: 138 mg/dL — ABNORMAL HIGH (ref 70–99)
Glucose-Capillary: 144 mg/dL — ABNORMAL HIGH (ref 70–99)
Glucose-Capillary: 167 mg/dL — ABNORMAL HIGH (ref 70–99)
Glucose-Capillary: 175 mg/dL — ABNORMAL HIGH (ref 70–99)

## 2019-10-28 LAB — BASIC METABOLIC PANEL
Anion gap: 10 (ref 5–15)
BUN: 25 mg/dL — ABNORMAL HIGH (ref 6–20)
CO2: 29 mmol/L (ref 22–32)
Calcium: 8.8 mg/dL — ABNORMAL LOW (ref 8.9–10.3)
Chloride: 100 mmol/L (ref 98–111)
Creatinine, Ser: 1.55 mg/dL — ABNORMAL HIGH (ref 0.44–1.00)
GFR calc Af Amer: 45 mL/min — ABNORMAL LOW (ref >60)
GFR calc non Af Amer: 39 mL/min — ABNORMAL LOW (ref >60)
Glucose, Bld: 167 mg/dL — ABNORMAL HIGH (ref 70–99)
Potassium: 3.4 mmol/L — ABNORMAL LOW (ref 3.5–5.1)
Sodium: 139 mmol/L (ref 135–145)

## 2019-10-28 LAB — CBC
HCT: 28.6 % — ABNORMAL LOW (ref 36.0–46.0)
Hemoglobin: 9.1 g/dL — ABNORMAL LOW (ref 12.0–15.0)
MCH: 28.4 pg (ref 26.0–34.0)
MCHC: 31.8 g/dL (ref 30.0–36.0)
MCV: 89.4 fL (ref 80.0–100.0)
Platelets: 230 10*3/uL (ref 150–400)
RBC: 3.2 MIL/uL — ABNORMAL LOW (ref 3.87–5.11)
RDW: 13.4 % (ref 11.5–15.5)
WBC: 7.2 10*3/uL (ref 4.0–10.5)
nRBC: 0 % (ref 0.0–0.2)

## 2019-10-28 MED ORDER — POTASSIUM CHLORIDE CRYS ER 20 MEQ PO TBCR
40.0000 meq | EXTENDED_RELEASE_TABLET | Freq: Once | ORAL | Status: AC
  Administered 2019-10-28: 40 meq via ORAL
  Filled 2019-10-28: qty 2

## 2019-10-28 MED ORDER — CARVEDILOL 3.125 MG PO TABS
3.1250 mg | ORAL_TABLET | Freq: Two times a day (BID) | ORAL | Status: DC
  Administered 2019-10-28 – 2019-10-29 (×2): 3.125 mg via ORAL
  Filled 2019-10-28 (×2): qty 1

## 2019-10-28 NOTE — Progress Notes (Signed)
EPWs removed per order. Pt tolerated very well, sites unremarkable, VSS.  Pt understands bedrest for one hr.  Call bell in reach.  CCMD notified, will monitor closely.

## 2019-10-28 NOTE — Progress Notes (Addendum)
CARDIAC REHAB PHASE I   PRE:  Walking in hallway with RN.  MODE:  Ambulation: 470 ft   POST:  Rate/Rhythm: ST 110  BP:  Sitting: 127/71     SaO2: 98% RA  1115-1245  Pt walking with RN in hallway.  CR staff took over walk.  Pt with steady gait but stopped for brief rest breaks x4 for some SOB.  Sats 98% on RA.  Pt assisted back to bed for education.  Pt eager to learn about diabetic diet recommendations.  This was reviewed as well as heart healthy diet, medications, sternal precautions, cessation of vaping, risk factors, and exercise guidelines.  Pt encouraged to wear bra after discharge.  Pt apprehensive as bra might run on incision.  Pt educated on need for bra and suggestion for front clasp sports bra.  Pt verbalized understanding.  All questions answered and pt verbalized understanding.  Referral made to CRP2 at [facility].  Pt will have her son's caregiver's assist with her recovery.  They are at her house 24/7.   Nikki Dom, RN 10/28/2019 12:50 PM

## 2019-10-28 NOTE — Progress Notes (Addendum)
[address]       [place] [address]             [phone]      4 Days Post-Op Procedure(s) (LRB): CORONARY ARTERY BYPASS GRAFTING (CABG) times four, using left radial artery harvested endoscopically and right greater saphenous vein harvested endoscopically. (N/A) TRANSESOPHAGEAL ECHOCARDIOGRAM (TEE) (N/A) Radial Artery Harvest (Left) Subjective: Pain is improved but still moderate  Objective: Vital signs in last 24 hours: Temp:  [98 F (36.7 C)-99.2 F (37.3 C)] 98.6 F (37 C) (02/27 0759) Pulse Rate:  [74-89] 74 (02/27 0759) Cardiac Rhythm: Normal sinus rhythm (02/27 0700) Resp:  [16-25] 17 (02/27 0759) BP: (89-123)/(53-77) 89/58 (02/27 0759) SpO2:  [90 %-97 %] 94 % (02/27 0759) Weight:  [112.7 kg] 112.7 kg (02/27 0547)  Hemodynamic parameters for last 24 hours:    Intake/Output from previous day: 02/26 0701 - 02/27 0700 In: -  Out: 900 [Urine:900] Intake/Output this shift: No intake/output data recorded.  General appearance: alert, cooperative and no distress Heart: regular rate and rhythm Lungs: mildly dim in bases Abdomen: benign Extremities: minoe edema Wound: incis healing well, left hand with some numbness, swelling improved  Lab Results: Recent Labs    10/27/19 0513 10/28/19 0548  WBC 8.1 7.2  HGB 9.9* 9.1*  HCT 30.0* 28.6*  PLT 139* 230   BMET:  Recent Labs    10/27/19 0513 10/28/19 0548  NA 137 139  K 3.8 3.4*  CL 101 100  CO2 25 29  GLUCOSE 125* 167*  BUN 24* 25*  CREATININE 1.19* 1.55*  CALCIUM 9.2 8.8*    PT/INR: No results for input(s): LABPROT, INR in the last 72 hours. ABG    Component Value Date/Time   PHART 7.307 (L) 10/24/2019 2238   HCO3 22.2 10/24/2019 2238   TCO2 24 10/24/2019 2238   ACIDBASEDEF 4.0 (H) 10/24/2019 2238   O2SAT 53.2 10/27/2019 0535   CBG (last 3)  Recent Labs    10/27/19 1904 10/27/19 2112 10/28/19 0615  GLUCAP 144* 90 138*    Meds Scheduled Meds: . sodium  chloride   Intravenous Once  . acetaminophen  1,000 mg Oral Q6H   Or  . acetaminophen (TYLENOL) oral liquid 160 mg/5 mL  1,000 mg Per Tube Q6H  . aspirin EC  325 mg Oral Daily   Or  . aspirin  324 mg Per Tube Daily  . atorvastatin  80 mg Oral q1800  . bisacodyl  10 mg Oral Daily   Or  . bisacodyl  10 mg Rectal Daily  . carvedilol  6.25 mg Oral BID WC  . Chlorhexidine Gluconate Cloth  6 each Topical Daily  . docusate sodium  200 mg Oral Daily  . enoxaparin (LOVENOX) injection  40 mg Subcutaneous Q24H  . ezetimibe  10 mg Oral QPM  . ferrous [id number] C-folic acid  1 capsule Oral BID PC  . FLUoxetine  40 mg Oral Daily  . furosemide  40 mg Oral Daily  . gabapentin  400 mg Oral TID  . insulin aspart  0-24 Units Subcutaneous TID WC & HS  . insulin aspart  4 Units Subcutaneous TID WC  . insulin detemir  10 Units Subcutaneous BID  . isosorbide mononitrate  15 mg Oral Daily  . mouth rinse  15 mL Mouth Rinse BID  . pantoprazole  40 mg Oral Daily  . sodium chloride flush  10-40 mL Intracatheter Q12H  . sodium chloride flush  3 mL Intravenous Q12H  . sorbitol  30 mL Oral Daily   Continuous Infusions: . sodium chloride    . sodium chloride    . sodium chloride Stopped (10/26/19 0906)   PRN Meds:.sodium chloride, dextrose, fluticasone, HYDROmorphone, levalbuterol, promethazine, sodium chloride flush, sodium chloride flush, sodium chloride flush, traMADol  Xrays DG Chest 2 View  Result Date: 10/28/2019 CLINICAL DATA:  CABG. EXAM: CHEST - 2 VIEW COMPARISON:  10/27/2019 FINDINGS: 0515 hours. Low lung volumes. Basilar atelectasis with tiny bilateral pleural effusions again noted. No findings to suggest airspace pulmonary edema. Right IJ sheath has been removed in the interval. Cardiopericardial silhouette is at upper limits of normal for size. The visualized bony structures of the thorax are intact. Telemetry leads overlie the chest. IMPRESSION: 1. Interval removal of the right IJ  sheath. 2. Mildly improved but persistent bibasilar atelectasis with tiny effusions. Electronically Signed   By: Kennith Center M.D.   On: 10/28/2019 05:33   DG Chest Port 1 View  Result Date: 10/27/2019 CLINICAL DATA:  Sore chest s/p CABG EXAM: PORTABLE CHEST 1 VIEW COMPARISON:  10/26/2019 FINDINGS: Normal size cardiac silhouette. Increase in bilateral pleural effusions. Removal of LEFT chest tube with out pneumothorax. Mild increase in LEFT basilar atelectasis. RIGHT IJ sheath is kinked proximally. IMPRESSION: 1. Removal of LEFT chest tube with out pneumothorax. 2. Mild increase in bilateral pleural effusions. 3. Mild increase in atelectasis at the LEFT lung base. 4. A kink in the proximal aspect of the RIGHT IJ sheath. Electronically Signed   By: Genevive Bi M.D.   On: 10/27/2019 09:01    Assessment/Plan: S/P Procedure(s) (LRB): CORONARY ARTERY BYPASS GRAFTING (CABG) times four, using left radial artery harvested endoscopically and right greater saphenous vein harvested endoscopically. (N/A) TRANSESOPHAGEAL ECHOCARDIOGRAM (TEE) (N/A) Radial Artery Harvest (Left)  1 conts with steady overall progress 2 BP is hypotensive at times, creat bumped from 1.19 to 1.55- will d/c lasix . She is on low dose Imdur for radial artery, and coreg- cont to monitor 3 sats ok on RA 4 replace K+ 5 H/H slightly lower, monitor clinically, still equilibrating 6 d/c epw's today 7 diabetic coordinator assisting with extremely poor diabetic management wit HgA1C 15.1- currently pretty good control 8  Poss home in 1-2 days   8 poss home 1-2 days   LOS: 4 days    Rowe [facility] 10/28/2019 Pager [phone]   Chart reviewed, patient examined, agree with above. She feels well and wants to go home tomorrow.  Her wt is still 19 lbs over preop. Lasix stopped today due to bump in creat to 1.55. She has lower ext edema L>R and is going to need to go home on diuretic. Will repeat BMET in am. BP is borderline  so will decrease Coreg to 3.125 bid.

## 2019-10-29 LAB — GLUCOSE, CAPILLARY: Glucose-Capillary: 189 mg/dL — ABNORMAL HIGH (ref 70–99)

## 2019-10-29 LAB — CBC
HCT: 26.3 % — ABNORMAL LOW (ref 36.0–46.0)
Hemoglobin: 8.3 g/dL — ABNORMAL LOW (ref 12.0–15.0)
MCH: 28.3 pg (ref 26.0–34.0)
MCHC: 31.6 g/dL (ref 30.0–36.0)
MCV: 89.8 fL (ref 80.0–100.0)
Platelets: 242 10*3/uL (ref 150–400)
RBC: 2.93 MIL/uL — ABNORMAL LOW (ref 3.87–5.11)
RDW: 13.4 % (ref 11.5–15.5)
WBC: 6.8 10*3/uL (ref 4.0–10.5)
nRBC: 0 % (ref 0.0–0.2)

## 2019-10-29 LAB — BASIC METABOLIC PANEL
Anion gap: 9 (ref 5–15)
BUN: 21 mg/dL — ABNORMAL HIGH (ref 6–20)
CO2: 29 mmol/L (ref 22–32)
Calcium: 8.7 mg/dL — ABNORMAL LOW (ref 8.9–10.3)
Chloride: 100 mmol/L (ref 98–111)
Creatinine, Ser: 1.24 mg/dL — ABNORMAL HIGH (ref 0.44–1.00)
GFR calc Af Amer: 59 mL/min — ABNORMAL LOW (ref >60)
GFR calc non Af Amer: 51 mL/min — ABNORMAL LOW (ref >60)
Glucose, Bld: 215 mg/dL — ABNORMAL HIGH (ref 70–99)
Potassium: 3.6 mmol/L (ref 3.5–5.1)
Sodium: 138 mmol/L (ref 135–145)

## 2019-10-29 MED ORDER — ISOSORBIDE MONONITRATE ER 30 MG PO TB24: 15.0000 mg | ORAL_TABLET | Freq: Every day | ORAL | 0 refills | Status: DC

## 2019-10-29 MED ORDER — ASPIRIN 325 MG PO TBEC: 325.0000 mg | DELAYED_RELEASE_TABLET | Freq: Every day | ORAL | Status: DC

## 2019-10-29 MED ORDER — FUROSEMIDE 40 MG PO TABS: 40.0000 mg | ORAL_TABLET | Freq: Every day | ORAL | 0 refills | Status: DC

## 2019-10-29 MED ORDER — CARVEDILOL 3.125 MG PO TABS: 3.1250 mg | ORAL_TABLET | Freq: Two times a day (BID) | ORAL | 1 refills | Status: DC

## 2019-10-29 MED ORDER — FE FUMARATE-B12-VIT C-FA-IFC PO CAPS: 1.0000 | ORAL_CAPSULE | Freq: Two times a day (BID) | ORAL | 2 refills | Status: DC

## 2019-10-29 MED ORDER — HYDROMORPHONE HCL 2 MG PO TABS: 2.0000 mg | ORAL_TABLET | Freq: Four times a day (QID) | ORAL | 0 refills | Status: AC | PRN

## 2019-10-29 MED ORDER — ASPIRIN 325 MG PO TBEC: 325.0000 mg | DELAYED_RELEASE_TABLET | Freq: Every day | ORAL | 0 refills | Status: DC

## 2019-10-29 MED ORDER — POTASSIUM CHLORIDE ER 10 MEQ PO TBCR: 10.0000 meq | EXTENDED_RELEASE_TABLET | Freq: Every day | ORAL | 0 refills | Status: DC

## 2019-10-29 MED ORDER — HYDROMORPHONE HCL 2 MG PO TABS: 2.0000 mg | ORAL_TABLET | Freq: Four times a day (QID) | ORAL | 0 refills | Status: DC | PRN

## 2019-10-29 NOTE — Progress Notes (Signed)
D/c midline and tele. The nurse went over AVS with the pt.  Gave pt the updated AVS about picking up meds at [facility]. Pt verbalized understanding.

## 2019-10-29 NOTE — Progress Notes (Addendum)
Chest tube sutures removed, per order. Benzoin and steri-strips applied. Left midline also removed for discharge. Gauze dressing applied.

## 2019-10-29 NOTE — Progress Notes (Signed)
[address]       [place] [address]             [phone]      5 Days Post-Op Procedure(s) (LRB): CORONARY ARTERY BYPASS GRAFTING (CABG) times four, using left radial artery harvested endoscopically and right greater saphenous vein harvested endoscopically. (N/A) TRANSESOPHAGEAL ECHOCARDIOGRAM (TEE) (N/A) Radial Artery Harvest (Left) Subjective: conts to feel better  Objective: Vital signs in last 24 hours: Temp:  [98.4 F (36.9 C)-98.8 F (37.1 C)] 98.7 F (37.1 C) (02/28 0314) Pulse Rate:  [70-80] 70 (02/28 0314) Cardiac Rhythm: Normal sinus rhythm;Bundle branch block (02/27 1900) Resp:  [14-20] 14 (02/28 0314) BP: (89-127)/(58-75) 113/69 (02/28 0314) SpO2:  [94 %-97 %] 94 % (02/28 0314) Weight:  [914 kg] 113 kg (02/28 0435)  Hemodynamic parameters for last 24 hours:    Intake/Output from previous day: 02/27 0701 - 02/28 0700 In: 340 [P.O.:240] Out: -  Intake/Output this shift: No intake/output data recorded.  General appearance: alert, cooperative and no distress Heart: regular rate and rhythm Lungs: min dim in bases Abdomen: benigbenign, obese Extremities: L>R LE edema Wound: incis healing well, left hand with some numbness persist in radial nerve distribution  Lab Results: Recent Labs    10/28/19 0548 10/29/19 0325  WBC 7.2 6.8  HGB 9.1* 8.3*  HCT 28.6* 26.3*  PLT 230 242   BMET:  Recent Labs    10/28/19 0548 10/29/19 0325  NA 139 138  K 3.4* 3.6  CL 100 100  CO2 29 29  GLUCOSE 167* 215*  BUN 25* 21*  CREATININE 1.55* 1.24*  CALCIUM 8.8* 8.7*    PT/INR: No results for input(s): LABPROT, INR in the last 72 hours. ABG    Component Value Date/Time   PHART 7.307 (L) 10/24/2019 2238   HCO3 22.2 10/24/2019 2238   TCO2 24 10/24/2019 2238   ACIDBASEDEF 4.0 (H) 10/24/2019 2238   O2SAT 53.2 10/27/2019 0535   CBG (last 3)  Recent Labs    10/28/19 1649 10/28/19 2123 10/29/19 0628  GLUCAP 167* 175* 189*     Meds Scheduled Meds: . sodium chloride   Intravenous Once  . acetaminophen  1,000 mg Oral Q6H   Or  . acetaminophen (TYLENOL) oral liquid 160 mg/5 mL  1,000 mg Per Tube Q6H  . aspirin EC  325 mg Oral Daily   Or  . aspirin  324 mg Per Tube Daily  . atorvastatin  80 mg Oral q1800  . bisacodyl  10 mg Oral Daily   Or  . bisacodyl  10 mg Rectal Daily  . carvedilol  3.125 mg Oral BID WC  . Chlorhexidine Gluconate Cloth  6 each Topical Daily  . docusate sodium  200 mg Oral Daily  . enoxaparin (LOVENOX) injection  40 mg Subcutaneous Q24H  . ezetimibe  10 mg Oral QPM  . ferrous fumarate-b12-vitamic C-folic acid  1 capsule Oral BID PC  . FLUoxetine  40 mg Oral Daily  . gabapentin  400 mg Oral TID  . insulin aspart  0-24 Units Subcutaneous TID WC & HS  . insulin aspart  4 Units Subcutaneous TID WC  . insulin detemir  10 Units Subcutaneous BID  . isosorbide mononitrate  15 mg Oral Daily  . mouth rinse  15 mL Mouth Rinse BID  . pantoprazole  40 mg Oral Daily  . sodium chloride flush  10-40 mL Intracatheter Q12H  . sodium chloride flush  3 mL Intravenous  Q12H   Continuous Infusions: . sodium chloride    . sodium chloride    . sodium chloride Stopped (10/26/19 0906)   PRN Meds:.sodium chloride, dextrose, fluticasone, HYDROmorphone, levalbuterol, promethazine, sodium chloride flush, sodium chloride flush, sodium chloride flush, traMADol  Xrays DG Chest 2 View  Result Date: 10/28/2019 CLINICAL DATA:  CABG. EXAM: CHEST - 2 VIEW COMPARISON:  10/27/2019 FINDINGS: 0515 hours. Low lung volumes. Basilar atelectasis with tiny bilateral pleural effusions again noted. No findings to suggest airspace pulmonary edema. Right IJ sheath has been removed in the interval. Cardiopericardial silhouette is at upper limits of normal for size. The visualized bony structures of the thorax are intact. Telemetry leads overlie the chest. IMPRESSION: 1. Interval removal of the right IJ sheath. 2. Mildly improved  but persistent bibasilar atelectasis with tiny effusions. Electronically Signed   By: Misty Stanley M.D.   On: 10/28/2019 05:33    Assessment/Plan: S/P Procedure(s) (LRB): CORONARY ARTERY BYPASS GRAFTING (CABG) times four, using left radial artery harvested endoscopically and right greater saphenous vein harvested endoscopically. (N/A) TRANSESOPHAGEAL ECHOCARDIOGRAM (TEE) (N/A) Radial Artery Harvest (Left)   1 conts to make good overall progress 2 BP control a little better with coreg reduced. Sinus rhythm 3 creat improved to 1.24, will re-initiate lasix for short term with continued volume overload 3 DM fairly well controlled- resume home meds, low carb diet , f/u with PMD/diabetes clinic 4 H/H fairly stable, not in transfusion threshold, cont Fe 5 appears stable for d/c  LOS: 5 days    John Giovanni [facility] 10/29/2019 Pager [phone]

## 2019-10-30 ENCOUNTER — Telehealth: Payer: Self-pay

## 2019-10-30 ENCOUNTER — Ambulatory Visit: Payer: 59 | Admitting: Cardiology

## 2019-10-30 MED FILL — metFORMIN HCL ER 500 MG TB2: 500 | 30 days supply | Qty: 60 | Fill #4

## 2019-10-30 MED FILL — POTASSIUM CHLORIDE ER 10 ME: 10 | 7 days supply | Qty: 7 | Fill #0

## 2019-10-30 MED FILL — FLUoxetine HCL 40 MG CAPS: 40 | 30 days supply | Qty: 30 | Fill #2

## 2019-10-30 MED FILL — FUROSEMIDE 40 MG TAB: 40 | 7 days supply | Qty: 7 | Fill #0

## 2019-10-30 MED FILL — GABAPENTIN 300 MG CAPSULE: 300 | 30 days supply | Qty: 180 | Fill #5

## 2019-10-30 NOTE — Telephone Encounter (Signed)
Transition Care Management Follow-up Telephone Call  Date of discharge and from where: 10/29/2019, [facility]  How have you been since you were released from the hospital? She said that she is tired and sore.  No chest pain reported. She said that the pain she is experiencing is due to the edema in her left arm and leg.   Any questions or concerns?  Question regarding lasix noted below.    Items Reviewed:  Did the pt receive and understand the discharge instructions provided? yes, she has the instructions and did not have any questions.   Medications obtained and verified?  she aid that she has all of her new medications. Needs refills for gabapentin, fluoxetine and metformin. [facility] pharmacy notified of the needs for refills.  Patient to have someone pick them up for her. Her only question about medication was regarding the furosemide  She said that she was instructed to take it for 1 week and she is not sure what to do after that but she can check with cardiology.   She is aware of the new/changed and discontinued medications.   Any new allergies since your discharge?  none reported   Do you have support at home?  yes, she said that she has someone helping her.   Other (ie: DME, Home Health, etc) no home health ordered,   Has glucometer.  Blood sugar this morning 197  Has a scale  - weighing herself daily. She said that she weighs 24 lbs more than she did when she was admitted to the hospital.    Referral had been made to cardiac rehab.   Functional Questionnaire: (I = Independent and D = Dependent) ADL's:independent   Follow up appointments reviewed:    PCP Hospital f/u appt confirmed? Marland Kitchen She wanted to wait until April to schedule an appointment because she has multiple specialist appts this month.  Appointment scheduled with Dr Laural Benes - 12/04/2019 @ 0930  Specialist Hospital f/u appt confirmed? .crdiology - 11/13/2019; enodcrinology - 11/16/2019; cardiothoracic surgery -  11/22/2019  Are transportation arrangements needed?  no , she has transportation .  If their condition worsens, is the pt aware to call  their PCP or go to the ED?  yes  Was the patient provided with contact information for the PCP's office or ED?   She has the phone number for this clinic as well as cardiology and cardiac surgery.   Was the pt encouraged to call back with questions or concerns?  yes

## 2019-10-31 ENCOUNTER — Telehealth: Payer: Self-pay | Admitting: *Deleted

## 2019-10-31 ENCOUNTER — Encounter (HOSPITAL_COMMUNITY): Payer: Self-pay | Admitting: *Deleted

## 2019-10-31 ENCOUNTER — Other Ambulatory Visit: Payer: Self-pay

## 2019-10-31 ENCOUNTER — Encounter: Payer: Self-pay | Admitting: Family

## 2019-10-31 ENCOUNTER — Ambulatory Visit: Payer: 59 | Attending: Family | Admitting: Family

## 2019-10-31 VITALS — BP 120/78 | HR 81 | Temp 98.0°F | Resp 15 | Ht 69.0 in | Wt 248.0 lb

## 2019-10-31 DIAGNOSIS — I2510 Atherosclerotic heart disease of native coronary artery without angina pectoris: Secondary | ICD-10-CM

## 2019-10-31 DIAGNOSIS — E1100 Type 2 diabetes mellitus with hyperosmolarity without nonketotic hyperglycemic-hyperosmolar coma (NKHHC): Secondary | ICD-10-CM

## 2019-10-31 DIAGNOSIS — L02429 Furuncle of limb, unspecified: Secondary | ICD-10-CM

## 2019-10-31 DIAGNOSIS — L02426 Furuncle of left lower limb: Secondary | ICD-10-CM | POA: Diagnosis not present

## 2019-10-31 DIAGNOSIS — Z951 Presence of aortocoronary bypass graft: Secondary | ICD-10-CM

## 2019-10-31 LAB — GLUCOSE, POCT (MANUAL RESULT ENTRY): POC Glucose: 209 mg/dl — AB (ref 70–99)

## 2019-10-31 NOTE — Telephone Encounter (Signed)
[name] has called the office this morning to relate a boil that she has noticed in her right groin as she showered this morning.  She said it may have been there for a few days.  She is very prone to these and has had to have them lanced before and prescibed antibiotics.  She said it is near one of her incisions where they used a vein.  She doesn't know what to do.  I consulted [name], PA and she said that this would be little risk of infection near the vein EVH site.  She should call the provider that usually sees her for this in the past.  I relayed this to [name].  She said that provider told her to go to the ED.  I told her to call them and tell them the advice we gave her.  She agreed. The boil is no larger than the head of a pencil, but she can feel it enlarge downward to the size of a nickel.

## 2019-10-31 NOTE — Progress Notes (Signed)
Referral received for Cardiac Rehab and verified for MD signature.  Follow up appt is 3/15 with Cardiology and 3/24 with [facility]. Insurance benefits and eligibility to be determined. Pt will be contacted and scheduled upon the satisfactory completion of both follow up appts. Alanson Aly, BSN Cardiac and Emergency planning/management officer

## 2019-10-31 NOTE — Progress Notes (Signed)
Patient ID: [name], female    DOB: [birth year]  MRN: [id number]  CC: Recurrent Skin Infections   Subjective: [name] is a 49 y.o. female with history of hypertension, angina pectoris, obstructive sleep apnea, uncontrolled type 2 diabetes mellitus with hyperosmolar nonketotic hyperglycemia, diabetic retinopathy, polyarthritis, dyspnea, right upper great quadrant abdominal pain, HGSIL on Pap smear of cervix, and status post CABG x4 who presents for concern of boil on left thigh.  Accompanied by her friend [name] for moral support.  1. LEFT THIGH BOIL:  Reports had 4th coronary artery bypass grafting (CABG) on October 24, 2019.  States the procedure required a vein to be removed from left lower leg to assist with CABG. Reports that she has a history of boils for the past 15 years in various locations on her body. Reports that she was told by heart surgeon after the most recent CABG that if any boils developed she should see her primary provider.   Two days later she noticed what appeared to be a boil on her left thigh.  States that the left leg is painful but she is unable to distinguish whether the pain is from the CABG left leg vein graft or from the boil. Does state that the boil is tender to touch with only minimal clear drainage a couple of times. Denies rubbing or scratching the boil.  Unsure if she has had a previous boil in the exact location of the left thigh boil that she presents with today.    Reports that she originally called the cardiologist when she saw the boil 2 days ago but was referred to primary provider. States that historically warm compresses caused inflammation in the past when she placed them on boils and was told by a doctor a few years ago to not use warm compresses moving forward because of that reason.    Patient Active Problem List   Diagnosis Date Noted  . Diabetic retinopathy (HCC) 10/25/2019  . 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 Angina pectoris (HCC)  10/06/2019  . Left-sided chest pain 06/13/2019  . Post-operative state 06/06/2018  . HGSIL (high grade squamous intraepithelial lesion) on Pap smear of cervix 02/25/2018  . Right upper quadrant abdominal pain 12/30/2017  . Polyarthritis 12/30/2017  . OSA (obstructive sleep apnea) 11/22/2013  . Dyspnea 11/22/2013  . Uncontrolled type 2 diabetes mellitus with hyperosmolar nonketotic hyperglycemia (HCC) 07/21/2012  . HTN (hypertension) 07/21/2012  . Smoker 07/21/2012     Current Outpatient Medications on File Prior to Visit  Medication Sig Dispense Refill  . acetaminophen (TYLENOL) 500 MG tablet Take 1,000 mg by mouth every 6 (six) hours as needed for moderate pain or headache.    Marland Kitchen aspirin 325 MG EC tablet Take 1 tablet (325 mg total) by mouth daily.    Marland Kitchen atorvastatin (LIPITOR) 80 MG tablet Take 1 tablet (80 mg total) by mouth daily at 6 PM. 30 tablet 2  . carvedilol (COREG) 3.125 MG tablet Take 1 tablet (3.125 mg total) by mouth 2 (two) times daily with a meal. 60 tablet 1  . esomeprazole (NEXIUM) 20 MG capsule Take 20 mg by mouth daily.     Marland Kitchen ezetimibe (ZETIA) 10 MG tablet Take 1 tablet (10 mg total) by mouth daily. (Patient taking differently: Take 10 mg by mouth every evening. ) 90 tablet 3  . ferrous fumarate-b12-vitamic C-folic acid (TRINSICON / FOLTRIN) capsule Take 1 capsule by mouth 2 (two) times daily after a meal. 60 capsule  2  . FLUoxetine (PROZAC) 40 MG capsule Take one tab daily with a 20 mg daily (Patient taking differently: Take 40 mg by mouth daily. ) 30 capsule 2  . fluticasone (FLONASE) 50 MCG/ACT nasal spray Place 2 sprays into both nostrils daily as needed for allergies or rhinitis.    . furosemide (LASIX) 40 MG tablet Take 1 tablet (40 mg total) by mouth daily. 7 tablet 0  . gabapentin (NEURONTIN) 300 MG capsule Take 3 capsules (900 mg total) by mouth 2 (two) times daily. 180 capsule 6  . glucose blood (TRUE METRIX BLOOD GLUCOSE TEST) test strip Use as instructed 100  each 12  . HYDROmorphone (DILAUDID) 2 MG tablet Take 1 tablet (2 mg total) by mouth every 6 (six) hours as needed for up to 7 days for severe pain. 28 tablet 0  . Insulin Detemir (LEVEMIR FLEXTOUCH) 100 UNIT/ML Pen Inject 53 Units into the skin 2 (two) times daily. (Patient taking differently: Inject 50 Units into the skin 2 (two) times daily. ) 15 mL 11  . Insulin Pen Needle (B-D UF III MINI PEN NEEDLES) 31G X 5 MM MISC Use as instructed. Inject into the skin three times daily 100 each 3  . isosorbide mononitrate (IMDUR) 30 MG 24 hr tablet Take 0.5 tablets (15 mg total) by mouth daily. 15 tablet 0  . liraglutide (VICTOZA) 18 MG/3ML SOPN Inject 0.3 mLs (1.8 mg total) into the skin daily. (Patient taking differently: Inject 1.8 mg into the skin every evening. ) 9 mL 2  . Melatonin 10 MG CAPS Take 10 mg by mouth at bedtime as needed (sleep).    . metFORMIN (GLUCOPHAGE-XR) 500 MG 24 hr tablet Take 2 tablets (1,000 mg total) by mouth daily with breakfast. 60 tablet 1  . potassium chloride (KLOR-CON) 10 MEQ tablet Take 1 tablet (10 mEq total) by mouth daily. 7 tablet 0  . TRUEPLUS SAFETY LANCETS 28G MISC Use as directed 100 each 3   No current facility-administered medications on file prior to visit.    Allergies  Allergen Reactions  . Sulfa Antibiotics Hives  . Codeine Itching    Social History   Socioeconomic History  . Marital status: Widowed    Spouse name: Not on file  . Number of children: 1  . Years of education: Not on file  . Highest education level: Not on file  Occupational History  . Occupation: none  Tobacco Use  . Smoking status: Former Smoker    Packs/day: 0.00    Years: 24.00    Pack years: 0.00    Types: E-cigarettes  . Smokeless tobacco: Current User  . Tobacco comment: vape  Substance and Sexual Activity  . Alcohol use: Yes    Comment: rare  . Drug use: No  . Sexual activity: Yes    Birth control/protection: None  Other Topics Concern  . Not on file  Social  History Narrative  . Not on file   Social Determinants of Health   Financial Resource Strain:   . Difficulty of Paying Living Expenses: Not on file  Food Insecurity:   . Worried About Charity fundraiser in the Last Year: Not on file  . Ran Out of Food in the Last Year: Not on file  Transportation Needs:   . Lack of Transportation (Medical): Not on file  . Lack of Transportation (Non-Medical): Not on file  Physical Activity:   . Days of Exercise per Week: Not on file  . Minutes of  Exercise per Session: Not on file  Stress:   . Feeling of Stress : Not on file  Social Connections:   . Frequency of Communication with Friends and Family: Not on file  . Frequency of Social Gatherings with Friends and Family: Not on file  . Attends Religious Services: Not on file  . Active Member of Clubs or Organizations: Not on file  . Attends Banker Meetings: Not on file  . Marital Status: Not on file  Intimate Partner Violence:   . Fear of Current or Ex-Partner: Not on file  . Emotionally Abused: Not on file  . Physically Abused: Not on file  . Sexually Abused: Not on file    Family History  Problem Relation Age of Onset  . Aneurysm Mother   . Heart attack Father   . Stroke Father   . Breast cancer Paternal Grandmother     Past Surgical History:  Procedure Laterality Date  . CARDIAC CATHETERIZATION     4-53yrs ago  . CERVICAL CONIZATION W/BX N/A 05/04/2018   Procedure: CONIZATION CERVIX WITH BIOPSY - COLD KNIFE;  Surgeon: Hermina Staggers, MD;  Location: [facility];  Service: Gynecology;  Laterality: N/A;  . CHOLECYSTECTOMY    . CORONARY ARTERY BYPASS GRAFT N/A 10/24/2019   Procedure: CORONARY ARTERY BYPASS GRAFTING (CABG) times four, using left radial artery harvested endoscopically and right greater saphenous vein harvested endoscopically.;  Surgeon: Kerin Perna, MD;  Location: [facility] OR;  Service: Open Heart Surgery;  Laterality: N/A;  . ENDOMETRIAL ABLATION      6 years ago  . INCISION AND DRAINAGE     for boil- buttocks  . INCISION AND DRAINAGE Left    Leg, Nephrotoxin fasciotimy  . IRRIGATION AND DEBRIDEMENT ABSCESS Right 03/17/2017   Procedure: IRRIGATION AND DEBRIDEMENT RIGHT THIGH ABSCESS;  Surgeon: Karie Soda, MD;  Location: [facility] ORS;  Service: General;  Laterality: Right;  . LAPAROSCOPIC APPENDECTOMY  10/04/2011   Procedure: APPENDECTOMY LAPAROSCOPIC;  Surgeon: Rulon Abide, DO;  Location: [facility];  Service: General;  Laterality: N/A;  . LEFT HEART CATH AND CORONARY ANGIOGRAPHY N/A 10/06/2019   Procedure: LEFT HEART CATH AND CORONARY ANGIOGRAPHY;  Surgeon: Swaziland, Peter M, MD;  Location: [facility] INVASIVE CV LAB;  Service: Cardiovascular;  Laterality: N/A;  . RADIAL ARTERY HARVEST Left 10/24/2019   Procedure: Radial Artery Harvest;  Surgeon: Kerin Perna, MD;  Location: [facility] OR;  Service: Open Heart Surgery;  Laterality: Left;  . TEE WITHOUT CARDIOVERSION N/A 10/24/2019   Procedure: TRANSESOPHAGEAL ECHOCARDIOGRAM (TEE);  Surgeon: Donata Clay, Theron Arista, MD;  Location: [facility] OR;  Service: Open Heart Surgery;  Laterality: N/A;    ROS: Review of Systems  Respiratory: Negative for shortness of breath and wheezing.   Cardiovascular: Negative for chest pain and palpitations.  Negative except as stated above  PHYSICAL EXAM: Ht 5\' 9"  (1.753 m)   Wt 248 lb (112.5 kg)   LMP 10/03/2011   BMI 36.62 kg/m   Vitals with BMI 10/31/2019 10/29/2019 10/29/2019  Height 5\' 9"  - -  Weight 248 lbs - 249 lbs 2 oz  BMI 36.61 - 37.89  Systolic 120 109 -  Diastolic 78 70 -  Pulse 81 75 -  Temperature: 98 F SpO2: 96%, room air  Physical Exam General appearance - alert, well appearing, and in no distress and oriented to person, place, and time Mental status - alert, oriented to person, place, and time, normal mood, behavior, speech, dress, motor activity, and  thought processes Chest - clear to auscultation, no wheezes, rales or rhonchi, symmetric air entry, no  tachypnea, retractions or cyanosis Heart - normal rate, regular rhythm, normal S1, S2, no murmurs, rubs, clicks or gallops Extremities - peripheral pulses normal,  post-CABG donor site noted on left lower extremity Skin - LESIONS NOTED: skin abscess on the left upper anterior thigh about the size of a pea/flat/no drainage/pink-red, left lower extremity and foot with ecchymosis   CMP Latest Ref Rng & Units 10/29/2019 10/28/2019 10/27/2019  Glucose 70 - 99 mg/dL 016(W) 109(N) 235(T)  BUN 6 - 20 mg/dL 73(U) 20(U) 54(Y)  Creatinine 0.44 - 1.00 mg/dL 7.06(C) 3.76(E) 8.31(D)  Sodium 135 - 145 mmol/L 138 139 137  Potassium 3.5 - 5.1 mmol/L 3.6 3.4(L) 3.8  Chloride 98 - 111 mmol/L 100 100 101  CO2 22 - 32 mmol/L 29 29 25   Calcium 8.9 - 10.3 mg/dL ) 1.7(O) 9.2  Total Protein 6.5 - 8.1 g/dL - - -  Total Bilirubin 0.3 - 1.2 mg/dL - - -  Alkaline Phos 38 - 126 U/L - - -  AST 15 - 41 U/L - - -  ALT 0 - 44 U/L - - -   Lipid Panel     Component Value Date/Time   CHOL 164 10/02/2019 0947   TRIG 164 (H) 10/02/2019 0947   HDL 58 10/02/2019 0947   CHOLHDL 2.8 10/02/2019 0947   CHOLHDL 5.5 06/14/2019 0009   VLDL 47 (H) 06/14/2019 0009   LDLCALC 78 10/02/2019 0947    CBC    Component Value Date/Time   WBC 6.8 10/29/2019 0325   RBC 2.93 (L) 10/29/2019 0325   HGB 8.3 (L) 10/29/2019 0325   HGB 12.9 10/05/2019 1509   HCT 26.3 (L) 10/29/2019 0325   HCT 39.7 10/05/2019 1509   PLT 242 10/29/2019 0325   PLT 203 10/05/2019 1509   MCV 89.8 10/29/2019 0325   MCV 85 10/05/2019 1509   MCH 28.3 10/29/2019 0325   MCHC 31.6 10/29/2019 0325   RDW 13.4 10/29/2019 0325   RDW 13.5 10/05/2019 1509   LYMPHSABS 2.2 06/05/2019 1039   MONOABS 0.4 07/07/2017 0830   EOSABS 0.0 06/05/2019 1039   BASOSABS 0.0 06/05/2019 1039    ASSESSMENT AND PLAN: 1. Boil, thigh: -Upon assessment of left thigh boil there is no pus or fluid pocket present.  Rather the thigh boil feels like scar tissue. -Counseled patient  on the importance of hand hygiene with soap and water before touching the site. -Counseled patient to keep watchful eye on the site and to report to the emergency department if the boil increases in size and/or if pus/fluid drainage develop as it may need to be lanced.  Patient agrees. -Use clean washcloth in warm water to cleanse. -May place a bandage or gauze over the site to prevent friction and change as needed.  -Do not squeeze or pop boil as this may cause infection spread.   2. Uncontrolled type 2 diabetes mellitus with hyperosmolar nonketotic hyperglycemia (HCC): - POCT glucose (manual entry)  Patient was given the opportunity to ask questions.  Patient verbalized understanding of the plan and was able to repeat key elements of the plan. Patient was given clear instructions to go to Emergency Department or return to medical center if symptoms don't improve, worsen, or new problems develop.The patient verbalized understanding.   Requested Prescriptions    No prescriptions requested or ordered in this encounter    Glennis Montenegro 08/05/2019, NP

## 2019-10-31 NOTE — Patient Instructions (Signed)

## 2019-10-31 NOTE — Progress Notes (Signed)
C /o boil warm to touch around pelvic Jarold Song with clear drainage x 2 days   CBG 209 non fasting Made NP aware  of Screening questionaire

## 2019-11-01 ENCOUNTER — Other Ambulatory Visit: Payer: Self-pay | Admitting: *Deleted

## 2019-11-01 DIAGNOSIS — R600 Localized edema: Secondary | ICD-10-CM

## 2019-11-01 MED ORDER — METOLAZONE 5 MG PO TABS: 5.0000 mg | ORAL_TABLET | Freq: Every day | ORAL | 0 refills | Status: DC

## 2019-11-01 NOTE — Progress Notes (Signed)
[name] called to relate that her legs are swollen, left greater than the right, it being the [facility] site  She is s/p CABG 2/112/21 with discharge on 10/29/19.  She had called yesterday with complaints of a boil in the left groin near her EVH incision.  We advised her to see the provider that [facility] takes care of these, which she did.  They felt it was stable at this point and needed no further attention.  But if it became larger, go to the ED.  I  discussed her LE edema with Dr. Maren Beach.  She is very compliant with leg elevation.  He ordered Zaroxolyn 5 mg x 3 days.  She agreed and this was e-prescribed.

## 2019-11-02 ENCOUNTER — Telehealth: Payer: Self-pay

## 2019-11-02 ENCOUNTER — Other Ambulatory Visit: Payer: Self-pay | Admitting: Internal Medicine

## 2019-11-02 ENCOUNTER — Other Ambulatory Visit: Payer: Self-pay

## 2019-11-02 DIAGNOSIS — R600 Localized edema: Secondary | ICD-10-CM

## 2019-11-02 MED ORDER — METOLAZONE 5 MG PO TABS: 5.0000 mg | ORAL_TABLET | Freq: Every day | ORAL | 0 refills | Status: AC

## 2019-11-02 MED ORDER — TORSEMIDE 20 MG PO TABS: 40.0000 mg | ORAL_TABLET | Freq: Every day | ORAL | 0 refills | Status: DC

## 2019-11-02 NOTE — Telephone Encounter (Signed)
-----   Message from Kerin Perna, MD sent at 11/02/2019  4:39 PM EST ----- Regarding: RE: Allergy to Zaroxolyn Contact: [phone] Give her torsemide 40 mg daily for a week and hold the lasix while on torsemide thanks ----- Message ----- From: Joycelyn Schmid, LPN Sent: 11/03/6813  11:32 AM EST To: Kerin Perna, MD Subject: Allergy to Zaroxolyn                           [name] is calling about the RX Zaroxolyn that was ordered yesterday. Her pharm. Told her she should not take this RX due to her sulfa allergy. I called [facility] and they did say that there was a small chemical structure of Sulfa in this medication. She is currently taking lasix 40 mg x 7 days  with potassium 10 meq . This is her 4th day on this RX. She is C/O extra fluid in both legs. Can we change to different med or increase her lasix? Please advise Thanks [name]

## 2019-11-03 MED FILL — ATORVASTATIN 80 MG TABLET: 80 | 90 days supply | Qty: 90 | Fill #0

## 2019-11-06 ENCOUNTER — Telehealth (HOSPITAL_COMMUNITY): Payer: Self-pay

## 2019-11-06 ENCOUNTER — Ambulatory Visit: Payer: 59 | Admitting: Cardiology

## 2019-11-06 NOTE — Telephone Encounter (Signed)
Pt insurance is active and benefits verified through [facility], DED $200/$200 met, out of pocket $800/$355.43 met, co-insurance 10%. no pre-authorization required, REF# [id number]  Will contact patient to see if she is interested in the Cardiac Rehab Program. If interested, patient will need to complete follow up appt. Once completed, patient will be contacted for scheduling upon review by the RN Navigator.

## 2019-11-11 NOTE — Progress Notes (Signed)
Cardiology Office Note:    Date:  11/13/2019   ID:  [name], DOB 1971-04-03, MRN [id number]  PCP:  Marcine Matar, MD  Cardiologist:  No primary care provider on file.  Electrophysiologist:  None   Referring MD: Marcine Matar, MD   No chief complaint on file.  History of Present Illness:    [name] is a 49 y.o. female with a hx of coronary artery disease status post CABG x4 on 10/24/2019 (LIMA-LAD, radial-diagonal, SVG-diagonal, SVG-PDA), type 2 diabetes, hypertension, hyperlipidemia who presents for a follow-up evaluation.  Patient was admitted to [facility] on 06/13/19 with chest pain.  EKG was unremarkable and high-sensitivity troponins were negative.  However due to her extensive risk factors including uncontrolled diabetes, coronary CTA was ordered.  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  She was discharged on medical management: atorvastatin 80 mg, Imdur 30 mg.  TTE showed EF 60-65%.  During subsequent office visits, her antianginal regimen was titrated as she continued to report chest pain.  At last clinic visit on 10/02/2019, she was on metoprolol 25 mg twice daily and Imdur 60 mg daily, but further titration was limited by low blood pressures.  Given inability to further titrate up antianginals and having persistent chest pain, cardiac catheterization was ordered.  Underwent cath on 10/06/2019, which showed three-vessel CAD: 80% mid LAD, 85% slitlike ostial D1, 80% ostial D2, 80% small PDA, anomalous circumflex off the RCA cusp with moderate diffuse disease.  Recommendation was for aggressive medical management and if symptoms persist consider surgical  consultation.  She continued to have chest pain despite medical management, so was referred to cardiac surgery.  She underwent CABG x4 on 10/24/2019 (LIMA-LAD, radial-diagonal, SVG-diagonal, SVG-PDA) with Dr. Donata Clay.  Since her surgery, she reports that she has been doing well.  States that the chest pain that she had prior to her surgery has resolved.  Does report some chest soreness.  Denies any dyspnea.  States that she walked 0.25 miles yesterday, denies any exertional symptoms.  Reports that since she had her procedure she had significant LE edema but completed course of torsemide x7 days, and is currently on last 2 days of course of Lasix, with resolution of her edema.    Past Medical History:  Diagnosis Date  . Anxiety   . Arthritis    fingers  . Cervical dysplasia   . Coronary artery disease   . Depression   . Diabetes mellitus    Type II  . Dyspnea 11/22/2013  . GERD (gastroesophageal reflux disease)   . High cholesterol   . History of kidney stones    passed  . Hypertension    no meds now  . Kidney stones   . Neuropathy    feet and legs  . Nuclear sclerotic cataract of both eyes 09/2019   Dr. Shea Evans  . PONV (postoperative nausea and vomiting)   . Sleep apnea    lost 70lbs no cpap x65yrs now    Past Surgical History:  Procedure Laterality Date  . CARDIAC CATHETERIZATION     4-29yrs ago  . CERVICAL CONIZATION W/BX N/A 05/04/2018   Procedure: CONIZATION CERVIX WITH BIOPSY - COLD KNIFE;  Surgeon: Hermina Staggers, MD;  Location: [facility];  Service: Gynecology;  Laterality: N/A;  . CHOLECYSTECTOMY    . CORONARY ARTERY BYPASS GRAFT N/A 10/24/2019   Procedure: CORONARY ARTERY BYPASS GRAFTING (CABG) times four, using left radial artery harvested endoscopically and right greater saphenous vein harvested endoscopically.;  Surgeon: Kerin Perna, MD;  Location: [facility] OR;  Service: Open Heart Surgery;  Laterality: N/A;  . ENDOMETRIAL ABLATION     6 years ago  .  INCISION AND DRAINAGE     for boil- buttocks  . INCISION AND DRAINAGE Left    Leg, Nephrotoxin fasciotimy  . IRRIGATION AND DEBRIDEMENT ABSCESS Right 03/17/2017   Procedure: IRRIGATION AND DEBRIDEMENT RIGHT THIGH ABSCESS;  Surgeon: Karie Soda, MD;  Location: [facility] ORS;  Service: General;  Laterality: Right;  . LAPAROSCOPIC APPENDECTOMY  10/04/2011   Procedure: APPENDECTOMY LAPAROSCOPIC;  Surgeon: Rulon Abide, DO;  Location: [facility] ORS;  Service: General;  Laterality: N/A;  . LEFT HEART CATH AND CORONARY ANGIOGRAPHY N/A 10/06/2019   Procedure: LEFT HEART CATH AND CORONARY ANGIOGRAPHY;  Surgeon: Swaziland, Peter M, MD;  Location: [facility] INVASIVE CV LAB;  Service: Cardiovascular;  Laterality: N/A;  . RADIAL ARTERY HARVEST Left 10/24/2019   Procedure: Radial Artery Harvest;  Surgeon: Kerin Perna, MD;  Location: [facility] OR;  Service: Open Heart Surgery;  Laterality: Left;  . TEE WITHOUT CARDIOVERSION N/A 10/24/2019   Procedure: TRANSESOPHAGEAL ECHOCARDIOGRAM (TEE);  Surgeon: Donata Clay, Theron Arista, MD;  Location: [facility] OR;  Service: Open Heart Surgery;  Laterality: N/A;    Current Medications: Current Meds  Medication Sig  . acetaminophen (TYLENOL) 500 MG tablet Take 1,000 mg by mouth every 6 (six) hours as needed for moderate pain or headache.  Marland Kitchen aspirin 325 MG EC tablet Take 1 tablet (325 mg total) by mouth daily.  Marland Kitchen atorvastatin (LIPITOR) 80 MG tablet TAKE 1 TABLET (80 MG TOTAL) BY MOUTH DAILY AT 6 PM.  . esomeprazole (NEXIUM) 20 MG capsule Take 20 mg by mouth daily.   Marland Kitchen ezetimibe (ZETIA) 10 MG tablet Take 1 tablet (10 mg total) by mouth daily.  . ferrous fumarate-b12-vitamic C-folic acid (TRINSICON / FOLTRIN) capsule Take 1 capsule by mouth 2 (two) times daily after a meal.  . FLUoxetine (PROZAC) 40 MG capsule Take 40 mg by mouth daily.  . fluticasone (FLONASE) 50 MCG/ACT nasal spray Place 2 sprays into both nostrils daily as needed for allergies or rhinitis.  . furosemide (LASIX) 40 MG tablet Take 1 tablet  (40 mg total) by mouth daily.  Marland Kitchen gabapentin (NEURONTIN) 300 MG capsule Take 3 capsules (900 mg total) by mouth 2 (two) times daily.  Marland Kitchen glucose blood (TRUE METRIX BLOOD GLUCOSE TEST) test strip Use as instructed  . Insulin Detemir (LEVEMIR FLEXTOUCH) 100 UNIT/ML Pen Inject 53 Units into the skin 2 (two) times daily. (Patient taking differently: Inject 50 Units into the skin 2 (two) times daily. )  . Insulin Pen Needle (B-D UF III MINI PEN NEEDLES) 31G X 5 MM MISC Use as instructed. Inject into the skin three times daily  . isosorbide mononitrate (IMDUR) 30 MG 24 hr tablet Take 0.5 tablets (15 mg total) by mouth daily.  Marland Kitchen liraglutide (VICTOZA) 18 MG/3ML SOPN Inject 0.3 mLs (1.8 mg total) into the skin daily. (Patient taking differently: Inject 1.8 mg into the skin every evening. )  . Melatonin 10 MG CAPS Take 10 mg by mouth at bedtime as needed (sleep).  . metFORMIN (GLUCOPHAGE-XR) 500 MG 24 hr  tablet Take 2 tablets (1,000 mg total) by mouth daily with breakfast.  . potassium chloride (KLOR-CON) 10 MEQ tablet Take 1 tablet (10 mEq total) by mouth daily.  . TRUEPLUS SAFETY LANCETS 28G MISC Use as directed  . [DISCONTINUED] carvedilol (COREG) 3.125 MG tablet Take 1 tablet (3.125 mg total) by mouth 2 (two) times daily with a meal.  . [DISCONTINUED] FLUoxetine (PROZAC) 40 MG capsule Take one tab daily with a 20 mg daily (Patient taking differently: Take 40 mg by mouth daily. Take one tab daily with a 20 mg daily)     Allergies:   Sulfa antibiotics and Codeine   Social History   Socioeconomic History  . Marital status: Widowed    Spouse name: Not on file  . Number of children: 1  . Years of education: Not on file  . Highest education level: Not on file  Occupational History  . Occupation: none  Tobacco Use  . Smoking status: Former Smoker    Packs/day: 0.00    Years: 24.00    Pack years: 0.00    Types: E-cigarettes  . Smokeless tobacco: Current User  . Tobacco comment: vape  Substance and  Sexual Activity  . Alcohol use: Yes    Comment: rare  . Drug use: No  . Sexual activity: Yes    Birth control/protection: None  Other Topics Concern  . Not on file  Social History Narrative  . Not on file   Social Determinants of Health   Financial Resource Strain:   . Difficulty of Paying Living Expenses:   Food Insecurity:   . Worried About Programme researcher, broadcasting/film/video in the Last Year:   . Barista in the Last Year:   Transportation Needs:   . Freight forwarder (Medical):   Marland Kitchen Lack of Transportation (Non-Medical):   Physical Activity:   . Days of Exercise per Week:   . Minutes of Exercise per Session:   Stress:   . Feeling of Stress :   Social Connections:   . Frequency of Communication with Friends and Family:   . Frequency of Social Gatherings with Friends and Family:   . Attends Religious Services:   . Active Member of Clubs or Organizations:   . Attends Banker Meetings:   Marland Kitchen Marital Status:      Family History: The patient's family history includes Aneurysm in her mother; Breast cancer in her paternal grandmother; Heart attack in her father; Stroke in her father.  ROS:   Please see the history of present illness.     All other systems reviewed and are negative.  EKGs/Labs/Other Studies Reviewed:    The following studies were reviewed today:   EKG:  EKG is ordered today.  The ekg ordered today demonstrates normal sinus rhythm, rate 97, right bundle branch block, nonspecific T wave flattening   TTE 06/14/19:  1. Left ventricular ejection fraction, by visual estimation, is 60 to 65%. The left ventricle has normal function. Normal left ventricular size. There is no left ventricular hypertrophy.  2. Left ventricular diastolic Doppler parameters are consistent with impaired relaxation pattern of LV diastolic filling.  3. Global right ventricle has normal systolic function.The right ventricular size is normal. No increase in right ventricular wall  thickness.  4. Left atrial size was normal.  5. Right atrial size was normal.  6. The mitral valve is normal in structure. No evidence of mitral valve regurgitation. No evidence of mitral stenosis.  7. The tricuspid  valve is normal in structure. Tricuspid valve regurgitation was not visualized by color flow Doppler.  8. The aortic valve is normal in structure. Aortic valve regurgitation was not visualized by color flow Doppler. Structurally normal aortic valve, with no evidence of sclerosis or stenosis.  9. The pulmonic valve was normal in structure. Pulmonic valve regurgitation is not visualized by color flow Doppler. 10. The inferior vena cava is normal in size with greater than 50% respiratory variability, suggesting right atrial pressure of 3 mmHg.  Coronary CT 06/15/19: 1. Coronary calcium score of 81. This was 6 percentile for age and sex matched control. 2. 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 Noncalcified plaque in mid LCX appears to cause severe stenosis (70-99%) but vessel is small (<47mm) 3. Calcified plaque in the proximal LAD causes 25-49% stenosis  CTFFR 06/15/19: 1. CT-FFR across lesion in proximal LAD is 0.92, suggesting lesion is not functionally significant 2. CT-FFR across lesion in ostial D2 is 0.62, suggesting functional significance 3. CT-FFR across lesion in anomalous circumflex is 0.65, suggesting functional significance  Cath 10/06/19:  Prox Cx lesion is 60% stenosed.  Mid Cx lesion is 70% stenosed.  RPDA lesion is 80% stenosed.  Mid LM to Ost LAD lesion is 35% stenosed.  1st Diag lesion is 85% stenosed.  2nd Diag lesion is 80% stenosed.  Mid LAD lesion is 80% stenosed.  LV end diastolic pressure is normal.   1. Three vessel CAD.    - 80% mid LAD after the second diagonal    - 85% slit like stenosis at the origin of the first diagonal- best seen in the LAO caudal shot.    - 80% ostial  second diagonal    - Anomalous LCx off the RCA cusp with moderate diffuse disease    - 80% small PDA 2. Normal LVEDP  Plan: reviewed with interventional colleagues. Her anatomy doesn't offer good PCI options except the mid LAD. Treating the ostial disease in the diagonal branches would likely require stenting into the LAD. The LCx and PDA are too small for PCI. I would aggressively treat medically. If symptoms persist I would consider surgical consultation.     Recent Labs: 10/20/2019: ALT 19 10/25/2019: Magnesium 2.0 10/29/2019: BUN 21; Creatinine, Ser 1.24; Hemoglobin 8.3; Platelets 242; Potassium 3.6; Sodium 138  Recent Lipid Panel    Component Value Date/Time   CHOL 164 10/02/2019 0947   TRIG 164 (H) 10/02/2019 0947   HDL 58 10/02/2019 0947   CHOLHDL 2.8 10/02/2019 0947   CHOLHDL 5.5 06/14/2019 0009   VLDL 47 (H) 06/14/2019 0009   LDLCALC 78 10/02/2019 0947    Physical Exam:    VS:  BP (!) 80/60   Pulse 98   Temp (!) 94.8 F (34.9 C)   Ht 5\' 9"  (1.753 m)   Wt 232 lb 6.4 oz (105.4 kg)   LMP 10/03/2011   SpO2 98%   BMI 34.32 kg/m     Wt Readings from Last 3 Encounters:  11/13/19 232 lb 6.4 oz (105.4 kg)  10/31/19 248 lb (112.5 kg)  10/29/19 249 lb 1.9 oz (113 kg)  Orthostatics: Lying 114/77 75 Sitting 112/78 64 Standing 99/68 67  GEN:  in no acute distress HEENT: Normal NECK: No JVD LYMPHATICS: No lymphadenopathy CARDIAC: RRR, no murmurs, rubs, gallops RESPIRATORY:  Clear to auscultation without rales, wheezing or rhonchi  ABDOMEN: Soft, non-tender, non-distended MUSCULOSKELETAL:  No edema; No deformity  SKIN: Warm and dry.  Chest tube wounds in upper abdomen are open, no erythema, warmth, or tenderness NEUROLOGIC:  Alert and oriented x 3 PSYCHIATRIC:  Normal affect   ASSESSMENT:    1. S/P CABG (coronary artery bypass graft)   2. Uncontrolled type 2 diabetes mellitus with diabetic neuropathy, with long-term current use of insulin (HCC)   3. Medication  management    PLAN:    Coronary artery disease: three-vessel CAD on cath 10/06/19: 80% mid LAD, 85% slitlike ostial D1, 80% ostial D2, 80% small PDA, anomalous circumflex off the RCA cusp with moderate diffuse disease.   Status post CABG x4 on 10/24/2019 (LIMA-LAD, radial-diagonal, SVG-diagonal, SVG-PDA).  Normal LV systolic function.   -Continue aspirin 325 mg daily  -Continue atorvastatin 80 mg daily.  LDL 78 on 10/02/19, added zetia 10 mg daily -BP soft (80/60, though improved to 110/80 on recheck), will switch from coreg to toprol XL 12.5 mg daily -Continue  Imdur 15 mg daily  -Completing course of lasix for hypervolemia following her procedure.  Appears euvolemic.  Check BMET, magnesium -Wounds from chest tubes following surgery are open.  She denies any fever/chills, and no tenderness/warmth to suggest infection, though there is some erythema around lower sternal incision.  Recommend contacting Dr Zenaida Niece Trigt's office for evaluation, she will send pictures today  Hyperlipidemia: LDL 204 on 06/14/19.  Started on atorvastatin 80 mg at that time.  LDL 78 on 10/02/19, added zetia 10 mg daily  Tobacco use: smoked 2ppd x 30 years, has quit smoking but now vaping.  Have discussed risks of vaping and cessation strongly recommended  Type 2 diabetes: A1c 15.1 on 10/20/2019. On insulin, victoza, metformin.  Have referred to endocrinology for management, first appointment on 6 3/18  RTC in 3 months   Medication Adjustments/Labs and Tests Ordered: Current medicines are reviewed at length with the patient today.  Concerns regarding medicines are outlined above.  Orders Placed This Encounter  Procedures  . Basic metabolic panel  . Magnesium  . EKG 12-Lead   Meds ordered this encounter  Medications  . DISCONTD: metoprolol succinate (TOPROL XL) 25 MG 24 hr tablet    Sig: Take 0.5 tablets (12.5 mg total) by mouth daily.    Dispense:  45 tablet    Refill:  3  . metoprolol succinate (TOPROL XL) 25 MG 24  hr tablet    Sig: Take 0.5 tablets (12.5 mg total) by mouth daily.    Dispense:  45 tablet    Refill:  3    Patient Instructions  Medication Instructions:  STOP carvedilol  START metoprolol succinate (Toprol XL) 12.5 mg daily  *If you need a refill on your cardiac medications before your next appointment, please call your pharmacy*  Lab Work: TODAY (BMET, Mag)   If you have labs (blood work) drawn today and your tests are completely normal, you will receive your results only by: Marland Kitchen MyChart Message (if you have MyChart) OR . A paper copy in the mail If you have any lab test that is abnormal or we need to change your treatment, we will call you to review the results.  Testing/Procedures: NONE  Follow-Up: At [facility], you and your health needs are our priority.  As part of our continuing mission to provide you with exceptional heart care, we have created designated Provider Care Teams.  These Care Teams include your primary Cardiologist (physician) and Advanced Practice Providers (APPs -  Physician Assistants and Nurse Practitioners) who all work together to provide you with  the care you need, when you need it.  We recommend signing up for the patient portal called "MyChart".  Sign up information is provided on this After Visit Summary.  MyChart is used to connect with patients for Virtual Visits (Telemedicine).  Patients are able to view lab/test results, encounter notes, upcoming appointments, etc.  Non-urgent messages can be sent to your provider as well.   To learn more about what you can do with MyChart, go to [email or web address].    Your next appointment:   3 month(s)  The format for your next appointment:   In Person  Provider:   Epifanio Lesches, MD   Other Instructions Please call surgeon's office to discuss wounds/chest tube sites     Signed, Little Ishikawa, MD  11/13/2019 6:04 PM    [facility]

## 2019-11-13 ENCOUNTER — Encounter: Payer: Self-pay | Admitting: Cardiology

## 2019-11-13 ENCOUNTER — Encounter: Payer: Self-pay | Admitting: Cardiothoracic Surgery

## 2019-11-13 ENCOUNTER — Other Ambulatory Visit: Payer: Self-pay

## 2019-11-13 ENCOUNTER — Ambulatory Visit: Payer: 59 | Admitting: Cardiology

## 2019-11-13 VITALS — BP 80/60 | HR 98 | Temp 94.8°F | Ht 69.0 in | Wt 232.4 lb

## 2019-11-13 DIAGNOSIS — E1140 Type 2 diabetes mellitus with diabetic neuropathy, unspecified: Secondary | ICD-10-CM

## 2019-11-13 DIAGNOSIS — Z79899 Other long term (current) drug therapy: Secondary | ICD-10-CM

## 2019-11-13 DIAGNOSIS — Z951 Presence of aortocoronary bypass graft: Secondary | ICD-10-CM | POA: Diagnosis not present

## 2019-11-13 DIAGNOSIS — IMO0002 Reserved for concepts with insufficient information to code with codable children: Secondary | ICD-10-CM

## 2019-11-13 DIAGNOSIS — Z720 Tobacco use: Secondary | ICD-10-CM

## 2019-11-13 DIAGNOSIS — E1165 Type 2 diabetes mellitus with hyperglycemia: Secondary | ICD-10-CM

## 2019-11-13 DIAGNOSIS — I25118 Atherosclerotic heart disease of native coronary artery with other forms of angina pectoris: Secondary | ICD-10-CM | POA: Diagnosis not present

## 2019-11-13 DIAGNOSIS — E785 Hyperlipidemia, unspecified: Secondary | ICD-10-CM

## 2019-11-13 DIAGNOSIS — Z794 Long term (current) use of insulin: Secondary | ICD-10-CM

## 2019-11-13 MED ORDER — METOPROLOL SUCCINATE ER 25 MG PO TB24: 12.5000 mg | ORAL_TABLET | Freq: Every day | ORAL | 3 refills | Status: DC

## 2019-11-13 MED FILL — METOPROLOL SUCCINATE ER 25: 25 | 30 days supply | Qty: 15 | Fill #0

## 2019-11-13 NOTE — Patient Instructions (Signed)
Medication Instructions:  STOP carvedilol  START metoprolol succinate (Toprol XL) 12.5 mg daily  *If you need a refill on your cardiac medications before your next appointment, please call your pharmacy*  Lab Work: TODAY (BMET, Mag)   If you have labs (blood work) drawn today and your tests are completely normal, you will receive your results only by: Marland Kitchen MyChart Message (if you have MyChart) OR . A paper copy in the mail If you have any lab test that is abnormal or we need to change your treatment, we will call you to review the results.  Testing/Procedures: NONE  Follow-Up: At [facility], you and your health needs are our priority.  As part of our continuing mission to provide you with exceptional heart care, we have created designated Provider Care Teams.  These Care Teams include your primary Cardiologist (physician) and Advanced Practice Providers (APPs -  Physician Assistants and Nurse Practitioners) who all work together to provide you with the care you need, when you need it.  We recommend signing up for the patient portal called "MyChart".  Sign up information is provided on this After Visit Summary.  MyChart is used to connect with patients for Virtual Visits (Telemedicine).  Patients are able to view lab/test results, encounter notes, upcoming appointments, etc.  Non-urgent messages can be sent to your provider as well.   To learn more about what you can do with MyChart, go to [email or web address].    Your next appointment:   3 month(s)  The format for your next appointment:   In Person  Provider:   Epifanio Lesches, MD   Other Instructions Please call surgeon's office to discuss wounds/chest tube sites

## 2019-11-14 ENCOUNTER — Telehealth: Payer: Self-pay

## 2019-11-14 ENCOUNTER — Other Ambulatory Visit: Payer: Self-pay

## 2019-11-14 DIAGNOSIS — T8149XA Infection following a procedure, other surgical site, initial encounter: Secondary | ICD-10-CM

## 2019-11-14 LAB — BASIC METABOLIC PANEL
BUN/Creatinine Ratio: 20 (ref 9–23)
BUN: 25 mg/dL — ABNORMAL HIGH (ref 6–24)
CO2: 27 mmol/L (ref 20–29)
Calcium: 10 mg/dL (ref 8.7–10.2)
Chloride: 96 mmol/L (ref 96–106)
Creatinine, Ser: 1.24 mg/dL — ABNORMAL HIGH (ref 0.57–1.00)
GFR calc Af Amer: 59 mL/min/{1.73_m2} — ABNORMAL LOW (ref >59)
GFR calc non Af Amer: 51 mL/min/{1.73_m2} — ABNORMAL LOW (ref >59)
Glucose: 189 mg/dL — ABNORMAL HIGH (ref 65–99)
Potassium: 4.6 mmol/L (ref 3.5–5.2)
Sodium: 139 mmol/L (ref 134–144)

## 2019-11-14 LAB — MAGNESIUM: Magnesium: 1.6 mg/dL (ref 1.6–2.3)

## 2019-11-14 MED ORDER — CEPHALEXIN 500 MG PO CAPS: 500.0000 mg | ORAL_CAPSULE | Freq: Three times a day (TID) | ORAL | 0 refills | Status: DC

## 2019-11-14 NOTE — Telephone Encounter (Signed)
Reviewed the pictures of surgical incision sites that were sent via my chart yesterday. Patient was told that they all look ok, no signs of infection, continue to clean daily with soap and water, pat dry. She can also use peroxide on the scabs to soften.  Patient is aware of signs of infection. She was instructed to call back if problems with her incisions develop. Her post-op appt is on 11/22/2019 with Dr Donata Clay.

## 2019-11-14 NOTE — Telephone Encounter (Signed)
RX for Keflex 500 mg TID x 7days call to pharm/ pt is aware

## 2019-11-15 ENCOUNTER — Ambulatory Visit
Admission: RE | Admit: 2019-11-15 | Discharge: 2019-11-15 | Disposition: A | Discharge status: Home / Self Care | Payer: 59 | Source: Ambulatory Visit | Attending: Cardiothoracic Surgery | Admitting: Cardiothoracic Surgery

## 2019-11-15 ENCOUNTER — Encounter: Payer: Self-pay | Admitting: Cardiothoracic Surgery

## 2019-11-15 ENCOUNTER — Other Ambulatory Visit: Payer: Self-pay | Admitting: Cardiothoracic Surgery

## 2019-11-15 ENCOUNTER — Ambulatory Visit (INDEPENDENT_AMBULATORY_CARE_PROVIDER_SITE_OTHER): Payer: Self-pay | Admitting: Cardiothoracic Surgery

## 2019-11-15 VITALS — BP 105/73 | HR 100 | Temp 97.6°F | Resp 20 | Ht 69.0 in | Wt 230.0 lb

## 2019-11-15 DIAGNOSIS — Z951 Presence of aortocoronary bypass graft: Secondary | ICD-10-CM

## 2019-11-15 DIAGNOSIS — T8149XA Infection following a procedure, other surgical site, initial encounter: Secondary | ICD-10-CM

## 2019-11-15 DIAGNOSIS — Z09 Encounter for follow-up examination after completed treatment for conditions other than malignant neoplasm: Secondary | ICD-10-CM

## 2019-11-15 DIAGNOSIS — I2510 Atherosclerotic heart disease of native coronary artery without angina pectoris: Secondary | ICD-10-CM

## 2019-11-15 MED ORDER — FUROSEMIDE 40 MG PO TABS: 40.0000 mg | ORAL_TABLET | Freq: Every day | ORAL | 0 refills | Status: DC

## 2019-11-15 MED FILL — EZETIMIBE 10 MG TAB: 10 | 30 days supply | Qty: 30 | Fill #1

## 2019-11-15 NOTE — Progress Notes (Signed)
PCP is Marcine Matar, MD Referring Provider is Cora Daniels, *  Chief Complaint  Patient presents with  . Routine Post Op    f/u from surgery with CXR s/p CABG x4, currently taking Keflex for surgical incisions    HPI: Postop wound check after patient called describing separation of the chest tube incisions in the upper abdominal fat and some tenderness around the left radial artery site with a well-formed eschar.  No drainage from the sternal incision.  Slight erythema around the distal sternal incision.  Yesterday the patient was started on oral Keflex 500 3 times daily.  Today she presents for evaluation with chest x-ray.  The patient denies recurrent angina and states she is able to walk a quarter of a mile now without difficulty she had before surgery, CABG x4. The chest x-ray today is clear. The lower sternal wound has some mild cellulitis which should respond to the Keflex.  There is no skin separation.  3 chest tube sites have skin separation with some fat necrosis which was cleaned with saline and peroxide.  Topical activated collagen gel was placed in the chest tube sites to augment wound healing.  The left wrist incision has erythema-cellulitis which should respond to the Keflex.  There is a well-formed eschar which is not ready to be debrided. The right leg incision at the vein harvest site is healing appropriately.  Patient still has some ankle edema. She has been cautious about using narcotics and does not need another refill.   Past Medical History:  Diagnosis Date  . Anxiety   . Arthritis    fingers  . Cervical dysplasia   . Coronary artery disease   . Depression   . Diabetes mellitus    Type II  . Dyspnea 11/22/2013  . GERD (gastroesophageal reflux disease)   . High cholesterol   . History of kidney stones    passed  . Hypertension    no meds now  . Kidney stones   . Neuropathy    feet and legs  . Nuclear sclerotic cataract of both eyes  09/2019   Dr. Shea Evans  . PONV (postoperative nausea and vomiting)   . Sleep apnea    lost 70lbs no cpap x40yrs now    Past Surgical History:  Procedure Laterality Date  . CARDIAC CATHETERIZATION     4-48yrs ago  . CERVICAL CONIZATION W/BX N/A 05/04/2018   Procedure: CONIZATION CERVIX WITH BIOPSY - COLD KNIFE;  Surgeon: Hermina Staggers, MD;  Location: [facility];  Service: Gynecology;  Laterality: N/A;  . CHOLECYSTECTOMY    . CORONARY ARTERY BYPASS GRAFT N/A 10/24/2019   Procedure: CORONARY ARTERY BYPASS GRAFTING (CABG) times four, using left radial artery harvested endoscopically and right greater saphenous vein harvested endoscopically.;  Surgeon: Kerin Perna, MD;  Location: [facility] OR;  Service: Open Heart Surgery;  Laterality: N/A;  . ENDOMETRIAL ABLATION     6 years ago  . INCISION AND DRAINAGE     for boil- buttocks  . INCISION AND DRAINAGE Left    Leg, Nephrotoxin fasciotimy  . IRRIGATION AND DEBRIDEMENT ABSCESS Right 03/17/2017   Procedure: IRRIGATION AND DEBRIDEMENT RIGHT THIGH ABSCESS;  Surgeon: Karie Soda, MD;  Location: [facility];  Service: General;  Laterality: Right;  . LAPAROSCOPIC APPENDECTOMY  10/04/2011   Procedure: APPENDECTOMY LAPAROSCOPIC;  Surgeon: Rulon Abide, DO;  Location: [facility];  Service: General;  Laterality: N/A;  . LEFT HEART CATH AND CORONARY ANGIOGRAPHY N/A 10/06/2019  Procedure: LEFT HEART CATH AND CORONARY ANGIOGRAPHY;  Surgeon: Martinique, Corri Delapaz M, MD;  Location: [place] [facility];  Service: Cardiovascular;  Laterality: N/A;  . RADIAL ARTERY HARVEST Left 10/24/2019   Procedure: Radial Artery Harvest;  Surgeon: Ivin Poot, MD;  Location: [place];  Service: Open Heart Surgery;  Laterality: Left;  . TEE WITHOUT CARDIOVERSION N/A 10/24/2019   Procedure: TRANSESOPHAGEAL ECHOCARDIOGRAM (TEE);  Surgeon: Prescott Gum, Collier Salina, MD;  Location: [facility];  Service: Open Heart Surgery;  Laterality: N/A;    Family History  Problem Relation Age of Onset  .  Aneurysm Mother   . Heart attack Father   . Stroke Father   . Breast cancer Paternal Grandmother     Social History Social History   Tobacco Use  . Smoking status: Former Smoker    Packs/day: 0.00    Years: 24.00    Pack years: 0.00    Types: E-cigarettes  . Smokeless tobacco: Current User  . Tobacco comment: vape  Substance Use Topics  . Alcohol use: Yes    Comment: rare  . Drug use: No    Current Outpatient Medications  Medication Sig Dispense Refill  . acetaminophen (TYLENOL) 500 MG tablet Take 1,000 mg by mouth every 6 (six) hours as needed for moderate pain or headache.    Marland Kitchen aspirin 325 MG EC tablet Take 1 tablet (325 mg total) by mouth daily.    Marland Kitchen atorvastatin (LIPITOR) 80 MG tablet TAKE 1 TABLET (80 MG TOTAL) BY MOUTH DAILY AT 6 PM. 30 tablet 2  . cephALEXin (KEFLEX) 500 MG capsule Take 1 capsule (500 mg total) by mouth 3 (three) times daily. 21 capsule 0  . esomeprazole (NEXIUM) 20 MG capsule Take 20 mg by mouth daily.     Marland Kitchen ezetimibe (ZETIA) 10 MG tablet Take 1 tablet (10 mg total) by mouth daily. 90 tablet 3  . ferrous [id number] C-folic acid (TRINSICON / FOLTRIN) capsule Take 1 capsule by mouth 2 (two) times daily after a meal. 60 capsule 2  . FLUoxetine (PROZAC) 40 MG capsule Take 40 mg by mouth daily.    . fluticasone (FLONASE) 50 MCG/ACT nasal spray Place 2 sprays into both nostrils daily as needed for allergies or rhinitis.    Marland Kitchen gabapentin (NEURONTIN) 300 MG capsule Take 3 capsules (900 mg total) by mouth 2 (two) times daily. 180 capsule 6  . glucose blood (TRUE METRIX BLOOD GLUCOSE TEST) test strip Use as instructed 100 each 12  . Insulin Detemir (LEVEMIR FLEXTOUCH) 100 UNIT/ML Pen Inject 53 Units into the skin 2 (two) times daily. (Patient taking differently: Inject 50 Units into the skin 2 (two) times daily. ) 15 mL 11  . Insulin Pen Needle (B-D UF III MINI PEN NEEDLES) 31G X 5 MM MISC Use as instructed. Inject into the skin three times daily 100 each  3  . isosorbide mononitrate (IMDUR) 30 MG 24 hr tablet Take 0.5 tablets (15 mg total) by mouth daily. 15 tablet 0  . liraglutide (VICTOZA) 18 MG/3ML SOPN Inject 0.3 mLs (1.8 mg total) into the skin daily. (Patient taking differently: Inject 1.8 mg into the skin every evening. ) 9 mL 2  . Melatonin 10 MG CAPS Take 10 mg by mouth at bedtime as needed (sleep).    . metFORMIN (GLUCOPHAGE-XR) 500 MG 24 hr tablet Take 2 tablets (1,000 mg total) by mouth daily with breakfast. 60 tablet 1  . metoprolol succinate (TOPROL XL) 25 MG 24 hr tablet Take 0.5 tablets (  12.5 mg total) by mouth daily. 45 tablet 3  . TRUEPLUS SAFETY LANCETS 28G MISC Use as directed 100 each 3   No current facility-administered medications for this visit.    Allergies  Allergen Reactions  . Sulfa Antibiotics Hives  . Codeine Itching    Review of Systems  No fever Blood sugars  150-1 80 Patient has had some constipation she relates to the iron tablet  BP 105/73   Pulse 100   Temp 97.6 F (36.4 C) (Skin)   Resp 20   Ht 5\' 9"  (1.753 m)   Wt 230 lb (104.3 kg)   LMP 10/03/2011   SpO2 97% Comment: RA  BMI 33.97 kg/m  Physical Exam      Exam    General- alert and comfortable.  Wounds examined and are described above.    Neck- no JVD, no cervical adenopathy palpable, no carotid bruit   Lungs- clear without rales, wheezes   Cor- regular rate and rhythm, no murmur , gallop   Abdomen- soft, non-tender   Extremities - warm, non-tender, minimal edema   Neuro- oriented, appropriate, no focal weakness   Diagnostic Tests: Chest x-ray reviewed and is clear.  No significant pleural effusion.  Sternal wires intact.  Impression: Doing well after multivessel CABG.  She was given instructions on how to do home wound care.  We will continue with her Lasix for another week due to ankle edema.  Plan: Return in 1 week for wound check.   12/01/2011, MD [facility] [phone]

## 2019-11-16 ENCOUNTER — Encounter: Payer: Self-pay | Admitting: Internal Medicine

## 2019-11-16 ENCOUNTER — Encounter: Payer: Self-pay | Admitting: Dietician

## 2019-11-16 ENCOUNTER — Encounter: Payer: 59 | Attending: Cardiology | Admitting: Dietician

## 2019-11-16 ENCOUNTER — Ambulatory Visit: Payer: 59 | Admitting: Dietician

## 2019-11-16 ENCOUNTER — Other Ambulatory Visit: Payer: Self-pay

## 2019-11-16 ENCOUNTER — Ambulatory Visit: Payer: 59 | Admitting: Internal Medicine

## 2019-11-16 VITALS — BP 118/72 | HR 101 | Temp 98.7°F | Ht 69.0 in | Wt 234.0 lb

## 2019-11-16 DIAGNOSIS — Z794 Long term (current) use of insulin: Secondary | ICD-10-CM

## 2019-11-16 DIAGNOSIS — E1142 Type 2 diabetes mellitus with diabetic polyneuropathy: Secondary | ICD-10-CM | POA: Diagnosis not present

## 2019-11-16 DIAGNOSIS — E1159 Type 2 diabetes mellitus with other circulatory complications: Secondary | ICD-10-CM

## 2019-11-16 DIAGNOSIS — E11319 Type 2 diabetes mellitus with unspecified diabetic retinopathy without macular edema: Secondary | ICD-10-CM

## 2019-11-16 DIAGNOSIS — E1140 Type 2 diabetes mellitus with diabetic neuropathy, unspecified: Secondary | ICD-10-CM

## 2019-11-16 DIAGNOSIS — E785 Hyperlipidemia, unspecified: Secondary | ICD-10-CM

## 2019-11-16 DIAGNOSIS — E1165 Type 2 diabetes mellitus with hyperglycemia: Secondary | ICD-10-CM

## 2019-11-16 DIAGNOSIS — E119 Type 2 diabetes mellitus without complications: Secondary | ICD-10-CM | POA: Insufficient documentation

## 2019-11-16 DIAGNOSIS — IMO0002 Reserved for concepts with insufficient information to code with codable children: Secondary | ICD-10-CM

## 2019-11-16 MED ORDER — FARXIGA 5 MG PO TABS: 5.0000 mg | ORAL_TABLET | Freq: Every day | ORAL | 3 refills | Status: DC

## 2019-11-16 MED ORDER — LEVEMIR FLEXTOUCH 100 UNIT/ML ~~LOC~~ SOPN: 37.0000 [IU] | PEN_INJECTOR | Freq: Two times a day (BID) | SUBCUTANEOUS | 11 refills | Status: DC

## 2019-11-16 NOTE — Progress Notes (Signed)
Name: [name]  MRN/ DOB: [id number], 1971-08-12   Age/ Sex: 49 y.o., female    PCP: Marcine Matar, MD   Reason for Endocrinology Evaluation: Type 2 Diabetes Mellitus     Date of Initial Endocrinology Visit: 11/16/2019     PATIENT IDENTIFIER: [name] is a 49 y.o. female with a past medical history of T2DM, CAD and HTN. The patient presented for initial endocrinology clinic visit on 11/16/2019 for consultative assistance with her diabetes management.    HPI: [name] was    Diagnosed with DM in 2004 Prior Medications tried/Intolerance: was on trulicity  In the past  But had diarrhea, Switched to victoza in 05/2019. Intolerant to higher doses of metformin .  Currently checking blood sugars 1-2 x / day Hypoglycemia episodes : no              Hemoglobin A1c has ranged from 7.2% in 2013, peaking at >15.0% in 2019. Patient required assistance for hypoglycemia: no Patient has required hospitalization within the last 1 year from hyper or hypoglycemia: no except for CABG   In terms of diet, the patient eats 2 meals a day, snacks the rest of the day, avoids- sugar sweetened beverage s  Pt with multivessel disease , S/P CABG 10/2019   HOME DIABETES REGIMEN: Levemir 53 units BID  Victoza 1.8 mg daily - has been on this 05/2019 Metformin 500 mg XR 2 tabs QAM    Statin: Yes ACE-I/ARB: No  Prior Diabetic Education:Yes   METER DOWNLOAD SUMMARY: Unable to down load   BG's in January, 2021 up to 500  In the past 2 weeks  Less then 200 mg/dL    DIABETIC COMPLICATIONS: Microvascular complications:   Neuropathy,?  retinopathy   Denies: CKD  Last eye exam: Completed 10/2019  Macrovascular complications:   CAD (CABG 10/24/2019)   Denies:  PVD, CVA   PAST HISTORY: Past Medical History:  Past Medical History:  Diagnosis Date  . Anxiety   . Arthritis    fingers  . Cervical dysplasia   . Coronary artery disease   . Depression   . Diabetes mellitus     Type II  . Dyspnea 11/22/2013  . GERD (gastroesophageal reflux disease)   . High cholesterol   . History of kidney stones    passed  . Hypertension    no meds now  . Kidney stones   . Neuropathy    feet and legs  . Nuclear sclerotic cataract of both eyes 09/2019   Dr. Shea Evans  . PONV (postoperative nausea and vomiting)   . Sleep apnea    lost 70lbs no cpap x51yrs now   Past Surgical History:  Past Surgical History:  Procedure Laterality Date  . CARDIAC CATHETERIZATION     4-14yrs ago  . CERVICAL CONIZATION W/BX N/A 05/04/2018   Procedure: CONIZATION CERVIX WITH BIOPSY - COLD KNIFE;  Surgeon: Hermina Staggers, MD;  Location: [facility];  Service: Gynecology;  Laterality: N/A;  . CHOLECYSTECTOMY    . CORONARY ARTERY BYPASS GRAFT N/A 10/24/2019   Procedure: CORONARY ARTERY BYPASS GRAFTING (CABG) times four, using left radial artery harvested endoscopically and right greater saphenous vein harvested endoscopically.;  Surgeon: Kerin Perna, MD;  Location: [facility] OR;  Service: Open Heart Surgery;  Laterality: N/A;  . ENDOMETRIAL ABLATION     6 years ago  . INCISION AND DRAINAGE     for boil- buttocks  . INCISION AND DRAINAGE Left  Leg, Nephrotoxin fasciotimy  . IRRIGATION AND DEBRIDEMENT ABSCESS Right 03/17/2017   Procedure: IRRIGATION AND DEBRIDEMENT RIGHT THIGH ABSCESS;  Surgeon: Karie Soda, MD;  [facility];  Service: General;  Laterality: Right;  . LAPAROSCOPIC APPENDECTOMY  10/04/2011   Procedure: APPENDECTOMY LAPAROSCOPIC;  Surgeon: Rulon Abide, DO;  Location: [facility];  Service: General;  Laterality: N/A;  . LEFT HEART CATH AND CORONARY ANGIOGRAPHY N/A 10/06/2019   Procedure: LEFT HEART CATH AND CORONARY ANGIOGRAPHY;  Surgeon: Swaziland, Peter M, MD;  [facility]: [facility];  Service: Cardiovascular;  Laterality: N/A;  . RADIAL ARTERY HARVEST Left 10/24/2019   Procedure: Radial Artery Harvest;  Surgeon: Kerin Perna, MD;  Location: [facility] OR;  Service:  Open Heart Surgery;  Laterality: Left;  . TEE WITHOUT CARDIOVERSION N/A 10/24/2019   Procedure: TRANSESOPHAGEAL ECHOCARDIOGRAM (TEE);  Surgeon: Donata Clay, Theron Arista, MD;  Location: [facility] OR;  Service: Open Heart Surgery;  Laterality: N/A;      Social History:  reports that she has quit smoking. Her smoking use included e-cigarettes. She smoked 0.00 packs per day for 24.00 years. She uses smokeless tobacco. She reports current alcohol use. She reports that she does not use drugs. Family History:  Family History  Problem Relation Age of Onset  . Aneurysm Mother   . Heart attack Father   . Stroke Father   . Breast cancer Paternal Grandmother      HOME MEDICATIONS: Allergies as of 11/16/2019      Reactions   Sulfa Antibiotics Hives   Codeine Itching      Medication List       Accurate as of November 16, 2019  8:34 AM. If you have any questions, ask your nurse or doctor.        acetaminophen 500 MG tablet Commonly known as: TYLENOL Take 1,000 mg by mouth every 6 (six) hours as needed for moderate pain or headache.   aspirin 325 MG EC tablet Take 1 tablet (325 mg total) by mouth daily.   atorvastatin 80 MG tablet Commonly known as: LIPITOR TAKE 1 TABLET (80 MG TOTAL) BY MOUTH DAILY AT 6 PM.   B-D UF III MINI PEN NEEDLES 31G X 5 MM Misc Generic drug: Insulin Pen Needle Use as instructed. Inject into the skin three times daily   cephALEXin 500 MG capsule Commonly known as: KEFLEX Take 1 capsule (500 mg total) by mouth 3 (three) times daily.   esomeprazole 20 MG capsule Commonly known as: NEXIUM Take 20 mg by mouth daily.   ezetimibe 10 MG tablet Commonly known as: ZETIA Take 1 tablet (10 mg total) by mouth daily.   ferrous fumarate-b12-vitamic C-folic acid capsule Commonly known as: TRINSICON / FOLTRIN Take 1 capsule by mouth 2 (two) times daily after a meal.   FLUoxetine 40 MG capsule Commonly known as: PROZAC Take 40 mg by mouth daily.   fluticasone 50 MCG/ACT nasal  spray Commonly known as: FLONASE Place 2 sprays into both nostrils daily as needed for allergies or rhinitis.   furosemide 40 MG tablet Commonly known as: Lasix Take 1 tablet (40 mg total) by mouth daily.   gabapentin 300 MG capsule Commonly known as: NEURONTIN Take 3 capsules (900 mg total) by mouth 2 (two) times daily.   glucose blood test strip Commonly known as: True Metrix Blood Glucose Test Use as instructed   isosorbide mononitrate 30 MG 24 hr tablet Commonly known as: IMDUR Take 0.5 tablets (15 mg total) by mouth daily.  Levemir FlexTouch 100 UNIT/ML FlexPen Generic drug: insulin detemir Inject 53 Units into the skin 2 (two) times daily.   Melatonin 10 MG Caps Take 10 mg by mouth at bedtime as needed (sleep).   metFORMIN 500 MG 24 hr tablet Commonly known as: GLUCOPHAGE-XR Take 2 tablets (1,000 mg total) by mouth daily with breakfast.   metoprolol succinate 25 MG 24 hr tablet Commonly known as: Toprol XL Take 0.5 tablets (12.5 mg total) by mouth daily.   TRUEplus Safety Lancets 28G Misc Use as directed   Victoza 18 MG/3ML Sopn Generic drug: liraglutide Inject 0.3 mLs (1.8 mg total) into the skin daily. What changed: when to take this        ALLERGIES: Allergies  Allergen Reactions  . Sulfa Antibiotics Hives  . Codeine Itching     REVIEW OF SYSTEMS: A comprehensive ROS was conducted with the patient and is negative except as per HPI and below:  Review of Systems  Eyes: Positive for blurred vision.  Gastrointestinal: Positive for constipation.      OBJECTIVE:   VITAL SIGNS: BP 118/72 (BP Location: Right Arm, Patient Position: Sitting, Cuff Size: Normal)   Pulse (!) 101   Temp 98.7 F (37.1 C)   Ht 5\' 9"  (1.753 m)   Wt 234 lb (106.1 kg)   LMP 10/03/2011   SpO2 97%   BMI 34.56 kg/m    PHYSICAL EXAM:  General: Pt appears well and is in NAD  HEENT:   Eyes: External eye exam normal without stare, lid lag or exophthalmos.  EOM intact.      Neck: General: Supple without adenopathy or carotid bruits. Thyroid: Thyroid size normal.  No goiter or nodules appreciated. No thyroid bruit.  Lungs: Clear with good BS bilat with no rales, rhonchi, or wheezes  Heart: RRR with normal S1 and S2 and no gallops; no murmurs; no rub  Abdomen: Normoactive bowel sounds, soft, nontender, without masses or organomegaly palpable  Extremities:  Lower extremities - No pretibial edema. No lesions.  Skin: Normal texture and temperature to palpation. No rash noted. No Acanthosis nigricans/skin tags. No lipohypertrophy.  Neuro: MS is good with appropriate affect, pt is alert and Ox3    DM foot exam: 11/16/2019  The skin of the feet is intact without sores or ulcerations. The pedal pulses are undetectable on today's exam  The sensation is decreased to a screening 5.07, 10 gram monofilament bilaterally   DATA REVIEWED:  Lab Results  Component Value Date   HGBA1C 15.1 (H) 10/20/2019   HGBA1C 12.2 (A) 06/05/2019   HGBA1C 8.2 (A) 09/29/2018   Lab Results  Component Value Date   MICROALBUR 0.96 06/02/2010   LDLCALC 78 10/02/2019   CREATININE 1.24 (H) 11/13/2019   Lab Results  Component Value Date   MICRALBCREAT 15 06/05/2019    Lab Results  Component Value Date   CHOL 164 10/02/2019   HDL 58 10/02/2019   LDLCALC 78 10/02/2019   TRIG 164 (H) 10/02/2019   CHOLHDL 2.8 10/02/2019        ASSESSMENT / PLAN / RECOMMENDATIONS:   1) Type 2 Diabetes Mellitus, Poorly controlled, With neuropathic, retinopathic and macrovascular complications - Most recent A1c of 15.1 %. Goal A1c < 7.0 %.    Plan: GENERAL: I have discussed with the patient the pathophysiology of diabetes. We went over the natural progression of the disease. We talked about both insulin resistance and insulin deficiency. We stressed the importance of lifestyle changes including diet and exercise.  I explained the complications associated with diabetes including retinopathy,  nephropathy, neuropathy as well as increased risk of cardiovascular disease. We went over the benefit seen with glycemic control.    I explained to the patient that diabetic patients are at higher than normal risk for amputations.   In review of her glucose meter , her glucose readings have come down tremendously over the past 2 months. She was running 300-500 mg/dL the end of 2020, by January, 2021 she was in the 200 mg/dL range and this months she has been less then 200's. This is due to dietary modifications that she has implemented recently  I have encouraged her to continue to improve on this, she is seeing our RD today   She has been noted with tight BG's in the BG with as low as 78 mg/dL, will adjust insulin as below  She is intolerant to higher doses of Metformin, she is intolerant to trulicity but is doing well with Victoza so far  We discuss add-on therpay with SGLT-2 inhibitors, we discussed risk of dehydration and genital infections, will start with a small dose.   MEDICATIONS: - Decrease Levemir to 37 units twice a day  - Continue Metformin 500 mg XR , 2 tablet with breakfast  - Continue Victoza 1.8 mg daily  - Start Farxiga 5 mg , daily with Breakfast   EDUCATION / INSTRUCTIONS:  BG monitoring instructions: Patient is instructed to check her blood sugars 2 times a day, fasting and bedtime .  Call [facility] if: BG persistently < 70 or > 300. . I reviewed the Rule of 15 for the treatment of hypoglycemia in detail with the patient. Literature supplied.   2) Diabetic complications:   Eye: Does  have known diabetic retinopathy. No treatment required at this time  Neuro/ Feet: Does  have known diabetic peripheral neuropathy.  Renal: Patient does not have known baseline CKD. She is not on an ACEI/ARB at present.  3) Dylipidemia: Patient is on lipitor and Zetia . LDL down from 204 to 78 mg/dL . We discussed the cardiovascular benefits of statins.      F/U in 3 months    Signed electronically by: Mack Guise, MD  [facility]  [address] [place], [place] [address] Phone: [phone] FAX: [phone]   CC: Ladell Pier, MD [place] [address] Phone: [phone]  Fax: [phone]    Return to Endocrinology clinic as below: Future Appointments  Date Time Provider [place]  11/16/2019  9:00 AM Clydell Hakim, RD [place] [facility]  11/22/2019  4:00 PM Ivin Poot, MD [facility]  12/04/2019  9:30 AM Ladell Pier, MD [facility] None  02/13/2020 11:20 AM Donato Heinz, MD [facility]

## 2019-11-16 NOTE — Progress Notes (Signed)
Diabetes Self-Management Education  Visit Type: First/Initial  Appt. Start Time: 0915 Appt. End Time: 3149  11/16/2019  [name], identified by name and date of birth, is a 49 y.o. female with a diagnosis of Diabetes: Type 2.   ASSESSMENT Patient is here today alone.  Patient of Dr. Kelton Pillar.  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 Labs noted to include A1C of 15.1% 10/20/19 increased from 12.2% 06/05/19. eGFR 51 Nov 04, 2019 She states that she has wanted to ignore her diabetes, is overwhelmed and burned out at times and has had varying insurance coverage over the years which effects control.  She states that her recent surgery has been a time for a new reset regarding lifestyle choices. Medications include Metformi XR, Levemir 60 units bid (changed to 37 units bid today), Victoza and Farxiga added today.  I provided her with an AccuChek Guide meter ([employer] supply).  Lot [id number], Ex[oratop 09/21/20.  She was able to demonstrate it's use with a glucose of 179 this am after breakfast. Strip request sent to medical assistant to be sent to [facility] on [address].  Patient lives with her 80 yo son.  He is developmentally delayed and does not talk.  He has Pica.  She works for the [employer]. Her husband died in 03-Nov-2002 at the age of 74 of a massive heart attack. She does not use salt.  Stopped chips since her open hear surgery. She has been walking a small amount. She eats 2 meals per day and 2-3 snacks. Discussed option of plant based eating for prevention of further progression of her disease.  She has heard of this before.  Although receptive to ideas she does not feel ready for this change at this time.  Height 5' 9"  (1.753 m), weight 234 lb (106.1 kg), last menstrual period 10/03/2011. Body mass index is 34.56 kg/m.  Diabetes Self-Management Education - 11/16/19 0930      Visit Information   Visit  Type  First/Initial      Initial Visit   Diabetes Type  Type 2    Are you currently following a meal plan?  No    Are you taking your medications as prescribed?  Yes    Date Diagnosed  November 03, 2002      Health Coping   How would you rate your overall health?  Poor      Psychosocial Assessment   Patient Belief/Attitude about Diabetes  Defeat/Burnout    Self-care barriers  None    Self-management support  Doctor's office    Other persons present  Patient    Patient Concerns  Nutrition/Meal planning;Weight Control;Healthy Lifestyle    Special Needs  None    Preferred Learning Style  No preference indicated    Learning Readiness  Ready    How often do you need to have someone help you when you read instructions, pamphlets, or other written materials from your doctor or pharmacy?  1 - Never    What is the last grade level you completed in school?  2 years college      Pre-Education Assessment   Patient understands the diabetes disease and treatment process.  Needs Review    Patient understands incorporating nutritional management into lifestyle.  Needs Review    Patient undertands incorporating physical activity into lifestyle.  Needs Review    Patient understands using medications safely.  Needs Review    Patient understands  monitoring blood glucose, interpreting and using results  Needs Review    Patient understands prevention, detection, and treatment of acute complications.  Needs Review    Patient understands prevention, detection, and treatment of chronic complications.  Needs Review    Patient understands how to develop strategies to address psychosocial issues.  Needs Review    Patient understands how to develop strategies to promote health/change behavior.  Needs Review      Complications   Last HgB A1C per patient/outside source  15.1 %   10/20/19   How often do you check your blood sugar?  1-2 times/day    Fasting Blood glucose range (mg/dL)  70-129    Postprandial Blood glucose  range (mg/dL)  130-179    Number of hypoglycemic episodes per month  0    Number of hyperglycemic episodes per week  0    Have you had a dilated eye exam in the past 12 months?  Yes    Have you had a dental exam in the past 12 months?  No    Are you checking your feet?  Yes    How many days per week are you checking your feet?  4      Dietary Intake   Breakfast  1/2 banana, plain cheerios OR 1/2 bagel with cream cheese OR 2 boiled eggs and toast OR skips    Snack (morning)  yogurt or sugar free jello    Lunch  skips unless she skips breakfast    Snack (afternoon)  vegetables with [place] dressing OR popcorn OR sugar free jello OR yogurt OR fresh fruit    Dinner  Salmon, brocoli and cawliflower with salt free blend, salad, and  rice or potatoes occasionally but is trying to avoid.  Uses an air fryer most often.    Snack (evening)  occasional 15 gram carb popsickle or popcorn    Beverage(s)  coffee with sugar free creamer      Exercise   Exercise Type  Light (walking / raking leaves)    How many days per week to you exercise?  3    How many minutes per day do you exercise?  15    Total minutes per week of exercise  45      Patient Education   Previous Diabetes Education  Yes (please comment)   2004   Nutrition management   Role of diet in the treatment of diabetes and the relationship between the three main macronutrients and blood glucose level;Meal options for control of blood glucose level and chronic complications.;Meal timing in regards to the patients' current diabetes medication.    Physical activity and exercise   Role of exercise on diabetes management, blood pressure control and cardiac health.    Medications  Reviewed patients medication for diabetes, action, purpose, timing of dose and side effects.    Monitoring  Taught/evaluated SMBG meter.;Taught/discussed recording of test results and interpretation of SMBG.;Identified appropriate SMBG and/or A1C goals.;Daily foot exams     Acute complications  Taught treatment of hypoglycemia - the 15 rule.    Chronic complications  Relationship between chronic complications and blood glucose control    Psychosocial adjustment  Worked with patient to identify barriers to care and solutions    Personal strategies to promote health  Review risk of smoking and offered smoking cessation      Individualized Goals (developed by patient)   Nutrition  General guidelines for healthy choices and portions discussed    Physical Activity  Exercise 5-7 days per week;30 minutes per day    Medications  take my medication as prescribed    Monitoring   test my blood glucose as discussed    Reducing Risk  increase portions of healthy fats;examine blood glucose patterns;do foot checks daily    Health Coping  discuss diabetes with (comment)      Post-Education Assessment   Patient understands the diabetes disease and treatment process.  Demonstrates understanding / competency    Patient understands incorporating nutritional management into lifestyle.  Demonstrates understanding / competency    Patient undertands incorporating physical activity into lifestyle.  Demonstrates understanding / competency    Patient understands using medications safely.  Demonstrates understanding / competency    Patient understands monitoring blood glucose, interpreting and using results  Demonstrates understanding / competency    Patient understands prevention, detection, and treatment of acute complications.  Demonstrates understanding / competency    Patient understands prevention, detection, and treatment of chronic complications.  Demonstrates understanding / competency    Patient understands how to develop strategies to address psychosocial issues.  Demonstrates understanding / competency    Patient understands how to develop strategies to promote health/change behavior.  Demonstrates understanding / competency      Outcomes   Expected Outcomes  Demonstrated  interest in learning. Expect positive outcomes    Future DMSE  PRN    Program Status  Completed       Individualized Plan for Diabetes Self-Management Training:   Learning Objective:  Patient will have a greater understanding of diabetes self-management. Patient education plan is to attend individual and/or group sessions per assessed needs and concerns.   Plan:   There are no Patient Instructions on file for this visit.  Expected Outcomes:  Demonstrated interest in learning. Expect positive outcomes  Education material provided: ADA - How to Thrive: A Guide for Your Journey with Diabetes, Meal plan card and Snack sheet, Diabetes resource page with resources for increased plant based resources, PCRM Diet and diabetes recipes for success.  If problems or questions, patient to contact team via:  Phone  Future DSME appointment: PRN

## 2019-11-16 NOTE — Patient Instructions (Signed)
Plan:  Aim for 3 Carb Choices per meal (45 grams) +/- 1 either way  Aim for 0-1 Carbs per snack if hungry.  In general aim for the lower carbohydrate snacks if hungry such as raw vegetables, sugar free jello, or cheese stick. Include protein in moderation with your meals and snacks Consider reading food labels for Total Carbohydrate of foods Consider  increasing your activity level by walking for 15-30 minutes daily as tolerated Continue checking BG at alternate times per day  Continue taking medication as directed by MD  Consider options for meatless meals and ways to add more vegetables with your meals. Continue to stay mindful. Consider MVI daily. See [email or web address] or other resources for meal ideas. Please call me if you have questions or increased discouraged about your diabetes and or nutrition.

## 2019-11-16 NOTE — Patient Instructions (Addendum)
-   Decrease Levemir to 37 units twice a day  - Continue Metformin 500 mg XR , 2 tablet with breakfast  - Continue Victoza 1.8 mg daily  - Start Farxiga 5 mg , daily with Breakfast       Choose healthy, lower carb lower calorie snacks: toss salad, vegetables, cottage cheese, peanut butter, low fat cheese / string cheese, lower sodium deli meat, tuna salad or chicken salad     HOW TO TREAT LOW BLOOD SUGARS (Blood sugar LESS THAN 70 MG/DL)  Please follow the RULE OF 15 for the treatment of hypoglycemia treatment (when your (blood sugars are less than 70 mg/dL)    STEP 1: Take 15 grams of carbohydrates when your blood sugar is low, which includes:   3-4 GLUCOSE TABS  OR  3-4 OZ OF JUICE OR REGULAR SODA OR  ONE TUBE OF GLUCOSE GEL     STEP 2: RECHECK blood sugar in 15 MINUTES STEP 3: If your blood sugar is still low at the 15 minute recheck --> then, go back to STEP 1 and treat AGAIN with another 15 grams of carbohydrates.

## 2019-11-21 ENCOUNTER — Other Ambulatory Visit: Payer: Self-pay | Admitting: Internal Medicine

## 2019-11-21 MED ORDER — JARDIANCE 10 MG PO TABS: 10.0000 mg | ORAL_TABLET | Freq: Every day | ORAL | 3 refills | Status: DC

## 2019-11-22 ENCOUNTER — Encounter: Payer: Self-pay | Admitting: Cardiothoracic Surgery

## 2019-11-22 ENCOUNTER — Ambulatory Visit: Payer: 59 | Admitting: Cardiothoracic Surgery

## 2019-11-22 ENCOUNTER — Other Ambulatory Visit: Payer: Self-pay

## 2019-11-22 ENCOUNTER — Ambulatory Visit (INDEPENDENT_AMBULATORY_CARE_PROVIDER_SITE_OTHER): Payer: Self-pay | Admitting: Cardiothoracic Surgery

## 2019-11-22 VITALS — BP 94/68 | HR 92 | Temp 97.9°F | Resp 16 | Ht 69.0 in | Wt 230.0 lb

## 2019-11-22 DIAGNOSIS — I2510 Atherosclerotic heart disease of native coronary artery without angina pectoris: Secondary | ICD-10-CM

## 2019-11-22 DIAGNOSIS — Z951 Presence of aortocoronary bypass graft: Secondary | ICD-10-CM

## 2019-11-22 MED ORDER — NYSTATIN 100000 UNIT/GM EX OINT: 1.0000 "application " | TOPICAL_OINTMENT | Freq: Two times a day (BID) | CUTANEOUS | 0 refills | Status: DC

## 2019-11-22 MED ORDER — FUROSEMIDE 40 MG PO TABS: 40.0000 mg | ORAL_TABLET | Freq: Every day | ORAL | 0 refills | Status: DC

## 2019-11-22 NOTE — Patient Instructions (Signed)
Stop Imdur, iron sulfate You may drive Do not lift more than 10 pounds Clean surgical incisions with Dial soap and water and paint with iodine or Neosporin Walk 20 minutes daily

## 2019-11-22 NOTE — Progress Notes (Signed)
PCP is Ladell Pier, MD Referring Provider is Donato Heinz*  Chief Complaint  Patient presents with  . Routine Post Op    Wound check--L wrist incision, s/p CABG 10/24/19    HPI: Patient returns for 1 month postop follow-up after urgent CABG using left IMA, left radial artery and saphenous vein grafts.  She also returns for wound check.  She developed some superficial cellulitis at the site of her chest tube incisions treated with debridement and activated collagen topical treatment at last visit.  She is completed a course of oral Keflex.  She has some mild erythema around the lower sternal incision which is improving.  She will continue use soap and water on incisions, pain with iodine and leave dry.  She has a more significant skin separation and eschar at the left wrist radial artery site.  This is clean and not draining.  Patient denies weight gain, fever, shortness of breath, angina or ankle swelling.  Now at 1 month postop we will stop her Imdur, iron folate, and Lasix. She had a chest x-ray last week which was clear.  Past Medical History:  Diagnosis Date  . Anxiety   . Arthritis    fingers  . Cervical dysplasia   . Coronary artery disease   . Depression   . Diabetes mellitus    Type II  . Dyspnea 11/22/2013  . GERD (gastroesophageal reflux disease)   . High cholesterol   . History of kidney stones    passed  . Hypertension    no meds now  . Kidney stones   . Neuropathy    feet and legs  . Nuclear sclerotic cataract of both eyes 09/2019   Dr. Idolina Primer  . PONV (postoperative nausea and vomiting)   . Sleep apnea    lost 70lbs no cpap x29yrs now    Past Surgical History:  Procedure Laterality Date  . CARDIAC CATHETERIZATION     4-15yrs ago  . CERVICAL CONIZATION W/BX N/A 05/04/2018   Procedure: CONIZATION CERVIX WITH BIOPSY - COLD KNIFE;  Surgeon: Chancy Milroy, MD;  Location: [facility];  Service: Gynecology;  Laterality: N/A;  .  CHOLECYSTECTOMY    . CORONARY ARTERY BYPASS GRAFT N/A 10/24/2019   Procedure: CORONARY ARTERY BYPASS GRAFTING (CABG) times four, using left radial artery harvested endoscopically and right greater saphenous vein harvested endoscopically.;  Surgeon: Ivin Poot, MD;  Location: [facility];  Service: Open Heart Surgery;  Laterality: N/A;  . ENDOMETRIAL ABLATION     6 years ago  . INCISION AND DRAINAGE     for boil- buttocks  . INCISION AND DRAINAGE Left    Leg, Nephrotoxin fasciotimy  . IRRIGATION AND DEBRIDEMENT ABSCESS Right 03/17/2017   Procedure: IRRIGATION AND DEBRIDEMENT RIGHT THIGH ABSCESS;  Surgeon: Michael Boston, MD;  Location: [facility];  Service: General;  Laterality: Right;  . LAPAROSCOPIC APPENDECTOMY  10/04/2011   Procedure: APPENDECTOMY LAPAROSCOPIC;  Surgeon: Judieth Keens, DO;  Location: [facility];  Service: General;  Laterality: N/A;  . LEFT HEART CATH AND CORONARY ANGIOGRAPHY N/A 10/06/2019   Procedure: LEFT HEART CATH AND CORONARY ANGIOGRAPHY;  Surgeon: Martinique, Fusaye Wachtel M, MD;  Location: [facility];  Service: Cardiovascular;  Laterality: N/A;  . RADIAL ARTERY HARVEST Left 10/24/2019   Procedure: Radial Artery Harvest;  Surgeon: Ivin Poot, MD;  Location: [facility];  Service: Open Heart Surgery;  Laterality: Left;  . TEE WITHOUT CARDIOVERSION N/A 10/24/2019   Procedure: TRANSESOPHAGEAL ECHOCARDIOGRAM (  TEE);  Surgeon: Kerin Perna, MD;  [facility];  Service: Open Heart Surgery;  Laterality: N/A;    Family History  Problem Relation Age of Onset  . Aneurysm Mother   . Heart attack Father   . Stroke Father   . Breast cancer Paternal Grandmother     Social History Social History   Tobacco Use  . Smoking status: Former Smoker    Packs/day: 0.00    Years: 24.00    Pack years: 0.00    Types: E-cigarettes  . Smokeless tobacco: Current User  . Tobacco comment: vape  Substance Use Topics  . Alcohol use: Yes    Comment: rare  . Drug use: No    Current  Outpatient Medications  Medication Sig Dispense Refill  . acetaminophen (TYLENOL) 500 MG tablet Take 1,000 mg by mouth every 6 (six) hours as needed for moderate pain or headache.    Marland Kitchen aspirin 325 MG EC tablet Take 1 tablet (325 mg total) by mouth daily.    Marland Kitchen atorvastatin (LIPITOR) 80 MG tablet TAKE 1 TABLET (80 MG TOTAL) BY MOUTH DAILY AT 6 PM. 30 tablet 2  . empagliflozin (JARDIANCE) 10 MG TABS tablet Take 10 mg by mouth daily before breakfast. 30 tablet 3  . esomeprazole (NEXIUM) 20 MG capsule Take 20 mg by mouth daily.     Marland Kitchen ezetimibe (ZETIA) 10 MG tablet Take 1 tablet (10 mg total) by mouth daily. 90 tablet 3  . FLUoxetine (PROZAC) 40 MG capsule Take 40 mg by mouth daily.    . fluticasone (FLONASE) 50 MCG/ACT nasal spray Place 2 sprays into both nostrils daily as needed for allergies or rhinitis.    . furosemide (LASIX) 40 MG tablet Take 1 tablet (40 mg total) by mouth daily. 7 tablet 0  . gabapentin (NEURONTIN) 300 MG capsule Take 3 capsules (900 mg total) by mouth 2 (two) times daily. 180 capsule 6  . glucose blood (TRUE METRIX BLOOD GLUCOSE TEST) test strip Use as instructed 100 each 12  . insulin detemir (LEVEMIR FLEXTOUCH) 100 UNIT/ML FlexPen Inject 37 Units into the skin 2 (two) times daily. 15 mL 11  . Insulin Pen Needle (B-D UF III MINI PEN NEEDLES) 31G X 5 MM MISC Use as instructed. Inject into the skin three times daily 100 each 3  . liraglutide (VICTOZA) 18 MG/3ML SOPN Inject 0.3 mLs (1.8 mg total) into the skin daily. (Patient taking differently: Inject 1.8 mg into the skin every evening. ) 9 mL 2  . Melatonin 10 MG CAPS Take 10 mg by mouth at bedtime as needed (sleep).    . metFORMIN (GLUCOPHAGE-XR) 500 MG 24 hr tablet Take 2 tablets (1,000 mg total) by mouth daily with breakfast. 60 tablet 1  . metoprolol succinate (TOPROL XL) 25 MG 24 hr tablet Take 0.5 tablets (12.5 mg total) by mouth daily. 45 tablet 3  . TRUEPLUS SAFETY LANCETS 28G MISC Use as directed 100 each 3   No  current facility-administered medications for this visit.    Allergies  Allergen Reactions  . Sulfa Antibiotics Hives  . Codeine Itching    Review of Systems  Improved energy appetite and sleeping pattern No fever No drainage from incisions  BP 94/68 (BP Location: Right Arm, Patient Position: Sitting, Cuff Size: Normal)   Pulse 92   Temp 97.9 F (36.6 C)   Resp 16   Ht 5\' 9"  (1.753 m)   Wt 230 lb (104.3 kg)   LMP 10/03/2011  SpO2 98% Comment: RA  BMI 33.97 kg/m  Physical Exam      Exam    General- alert and comfortable.  Surgical wounds as noted above.    Neck- no JVD, no cervical adenopathy palpable, no carotid bruit   Lungs- clear without rales, wheezes   Cor- regular rate and rhythm, normal sinus rhythm, no murmur , gallop   Abdomen- soft, non-tender   Extremities - warm, non-tender, minimal edema   Neuro- oriented, appropriate, no focal weakness   Diagnostic Tests: None today  Impression: Doing well 1 month after multivessel CABG with some superficial breakdown of the chest tube incisions and some mild erythema around the lower sternal incision.  She does have some fungal skin changes underneath the left breast and will be prescribed some nystatin cream.  Plan: Return for wound check in 2 weeks.  She understands she may now drive and lift up to 10 pounds.  Follow heart healthy diabetic diet.  Avoid salt. Walk 20 minutes daily as a goal.  Mikey Bussing, MD [facility] [phone]

## 2019-11-23 NOTE — Telephone Encounter (Signed)
Called pt to see if she was interested in the cardiac rehab program pt stated that she was going back to work soon and that she wouldn't have the time to come in 3 days a week. Pt also stated that she walks and is pretty active at home, advised pt about our virtual program and pt seemed interested in that because she wouldn't have to come in 3 days a week, advised pt that she would have to come in for one day for about an hour for orientation pt declined. Closed referral.

## 2019-11-27 MED FILL — GABAPENTIN 300 MG CAPSULE: 300 | 30 days supply | Qty: 180 | Fill #6

## 2019-11-27 MED FILL — FLUoxetine HCL 40 MG CAPS: 40 | 30 days supply | Qty: 30 | Fill #2

## 2019-11-27 MED FILL — metFORMIN HCL ER 500 MG TB2: 500 | 30 days supply | Qty: 60 | Fill #5

## 2019-11-30 ENCOUNTER — Ambulatory Visit: Payer: 59 | Admitting: Dietician

## 2019-12-04 ENCOUNTER — Ambulatory Visit: Payer: 59 | Admitting: Internal Medicine

## 2019-12-06 ENCOUNTER — Encounter: Payer: Self-pay | Admitting: Cardiothoracic Surgery

## 2019-12-06 ENCOUNTER — Other Ambulatory Visit: Payer: Self-pay

## 2019-12-06 ENCOUNTER — Ambulatory Visit (INDEPENDENT_AMBULATORY_CARE_PROVIDER_SITE_OTHER): Payer: Self-pay | Admitting: Cardiothoracic Surgery

## 2019-12-06 DIAGNOSIS — Z5189 Encounter for other specified aftercare: Secondary | ICD-10-CM

## 2019-12-06 DIAGNOSIS — Z4889 Encounter for other specified surgical aftercare: Secondary | ICD-10-CM | POA: Insufficient documentation

## 2019-12-06 NOTE — Progress Notes (Signed)
PCP is Ladell Pier, MD Referring Provider is Donato Heinz*  Chief Complaint  Patient presents with  . Wound Check    s/p CABG x 4 on 10/24/19    HPI: The patient returns for scheduled visit, wound check, 6 weeks after urgent CABG x4 for unstable angina.  She his a difficult diabetic and has had delayed wound healing in her left wrist with an eschar at the site of the endoscopic radial artery harvest and an eschar at the lower sternal incision.  She is completed a course of oral Keflex and both areas are improved.  Overall she feels much better with improved strength exercise tolerance, less fatigue and improved appetite.  Still some soreness in her chest neck and left forearm.  Chest x-ray performed today was personally reviewed and shows clear lung fields, no pleural effusions and sternal wires intact.  Past Medical History:  Diagnosis Date  . Anxiety   . Arthritis    fingers  . Cervical dysplasia   . Coronary artery disease   . Depression   . Diabetes mellitus    Type II  . Dyspnea 11/22/2013  . GERD (gastroesophageal reflux disease)   . High cholesterol   . History of kidney stones    passed  . Hypertension    no meds now  . Kidney stones   . Neuropathy    feet and legs  . Nuclear sclerotic cataract of both eyes 09/2019   Dr. Idolina Primer  . PONV (postoperative nausea and vomiting)   . Sleep apnea    lost 70lbs no cpap x44yrs now    Past Surgical History:  Procedure Laterality Date  . CARDIAC CATHETERIZATION     4-65yrs ago  . CERVICAL CONIZATION W/BX N/A 05/04/2018   Procedure: CONIZATION CERVIX WITH BIOPSY - COLD KNIFE;  Surgeon: Chancy Milroy, MD;  Location: [facility];  Service: Gynecology;  Laterality: N/A;  . CHOLECYSTECTOMY    . CORONARY ARTERY BYPASS GRAFT N/A 10/24/2019   Procedure: CORONARY ARTERY BYPASS GRAFTING (CABG) times four, using left radial artery harvested endoscopically and right greater saphenous vein harvested  endoscopically.;  Surgeon: Ivin Poot, MD;  Location: [facility];  Service: Open Heart Surgery;  Laterality: N/A;  . ENDOMETRIAL ABLATION     6 years ago  . INCISION AND DRAINAGE     for boil- buttocks  . INCISION AND DRAINAGE Left    Leg, Nephrotoxin fasciotimy  . IRRIGATION AND DEBRIDEMENT ABSCESS Right 03/17/2017   Procedure: IRRIGATION AND DEBRIDEMENT RIGHT THIGH ABSCESS;  Surgeon: Michael Boston, MD;  Location: [facility];  Service: General;  Laterality: Right;  . LAPAROSCOPIC APPENDECTOMY  10/04/2011   Procedure: APPENDECTOMY LAPAROSCOPIC;  Surgeon: Judieth Keens, DO;  Location: [facility];  Service: General;  Laterality: N/A;  . LEFT HEART CATH AND CORONARY ANGIOGRAPHY N/A 10/06/2019   Procedure: LEFT HEART CATH AND CORONARY ANGIOGRAPHY;  Surgeon: Martinique, Tiarrah Saville M, MD;  Location: [facility];  Service: Cardiovascular;  Laterality: N/A;  . RADIAL ARTERY HARVEST Left 10/24/2019   Procedure: Radial Artery Harvest;  Surgeon: Ivin Poot, MD;  Location: [facility];  Service: Open Heart Surgery;  Laterality: Left;  . TEE WITHOUT CARDIOVERSION N/A 10/24/2019   Procedure: TRANSESOPHAGEAL ECHOCARDIOGRAM (TEE);  Surgeon: Prescott Gum, Collier Salina, MD;  Location: [facility];  Service: Open Heart Surgery;  Laterality: N/A;    Family History  Problem Relation Age of Onset  . Aneurysm Mother   . Heart attack Father   .  Stroke Father   . Breast cancer Paternal Grandmother     Social History Social History   Tobacco Use  . Smoking status: Former Smoker    Packs/day: 0.00    Years: 24.00    Pack years: 0.00    Types: E-cigarettes  . Smokeless tobacco: Current User  . Tobacco comment: vape  Substance Use Topics  . Alcohol use: Yes    Comment: rare  . Drug use: No    Current Outpatient Medications  Medication Sig Dispense Refill  . acetaminophen (TYLENOL) 500 MG tablet Take 1,000 mg by mouth every 6 (six) hours as needed for moderate pain or headache.    Marland Kitchen aspirin 325 MG EC tablet Take 1 tablet (325  mg total) by mouth daily.    Marland Kitchen atorvastatin (LIPITOR) 80 MG tablet TAKE 1 TABLET (80 MG TOTAL) BY MOUTH DAILY AT 6 PM. 30 tablet 2  . empagliflozin (JARDIANCE) 10 MG TABS tablet Take 10 mg by mouth daily before breakfast. 30 tablet 3  . esomeprazole (NEXIUM) 20 MG capsule Take 20 mg by mouth daily.     Marland Kitchen ezetimibe (ZETIA) 10 MG tablet Take 1 tablet (10 mg total) by mouth daily. 90 tablet 3  . FLUoxetine (PROZAC) 40 MG capsule Take 40 mg by mouth daily.    . fluticasone (FLONASE) 50 MCG/ACT nasal spray Place 2 sprays into both nostrils daily as needed for allergies or rhinitis.    Marland Kitchen gabapentin (NEURONTIN) 300 MG capsule Take 3 capsules (900 mg total) by mouth 2 (two) times daily. 180 capsule 6  . glucose blood (TRUE METRIX BLOOD GLUCOSE TEST) test strip Use as instructed 100 each 12  . insulin detemir (LEVEMIR FLEXTOUCH) 100 UNIT/ML FlexPen Inject 37 Units into the skin 2 (two) times daily. 15 mL 11  . Insulin Pen Needle (B-D UF III MINI PEN NEEDLES) 31G X 5 MM MISC Use as instructed. Inject into the skin three times daily 100 each 3  . liraglutide (VICTOZA) 18 MG/3ML SOPN Inject 0.3 mLs (1.8 mg total) into the skin daily. (Patient taking differently: Inject 1.8 mg into the skin every evening. ) 9 mL 2  . Melatonin 10 MG CAPS Take 10 mg by mouth at bedtime as needed (sleep).    . metFORMIN (GLUCOPHAGE-XR) 500 MG 24 hr tablet Take 2 tablets (1,000 mg total) by mouth daily with breakfast. 60 tablet 1  . metoprolol succinate (TOPROL XL) 25 MG 24 hr tablet Take 0.5 tablets (12.5 mg total) by mouth daily. 45 tablet 3  . nystatin ointment (MYCOSTATIN) Apply 1 application topically 2 (two) times daily. 30 g 0  . TRUEPLUS SAFETY LANCETS 28G MISC Use as directed 100 each 3   No current facility-administered medications for this visit.    Allergies  Allergen Reactions  . Sulfa Antibiotics Hives  . Codeine Itching    Review of Systems  Patient is driving Patient is avoid lifting more than 10 pounds  until 3 months after surgery  BP 107/73 (BP Location: Left Arm, Patient Position: Sitting, Cuff Size: Normal)   Pulse 97   Temp 98.1 F (36.7 C) (Temporal)   Resp 20   Ht 5\' 9"  (1.753 m)   Wt 227 lb (103 kg)   LMP 10/03/2011   SpO2 95% Comment: RA  BMI 33.52 kg/m  Physical Exam      Exam    General- alert and comfortable.  Both sternal and left arm incisions are improved.  Left leg incision is healed.  Neck- no JVD, no cervical adenopathy palpable, no carotid bruit   Lungs- clear without rales, wheezes   Cor- regular rate and rhythm, no murmur , gallop   Abdomen- soft, non-tender   Extremities - warm, non-tender, minimal edema   Neuro- oriented, appropriate, no focal weakness  Diagnostic Tests: Chest x-ray today is clear  Impression: Patient making good progress now after urgent CABG x4 He will continue current medications Her goal is to walk 20 minutes daily, normal soap and water for wound care.  Plan: Return in 2 weeks for wound check.  The left arm eschar may be ready separate at that time.   Mikey Bussing, MD [facility] [phone]

## 2019-12-18 MED FILL — METOPROLOL SUCCINATE ER 25: 25 | 30 days supply | Qty: 15 | Fill #1

## 2019-12-18 MED FILL — EZETIMIBE 10 MG TAB: 10 | 30 days supply | Qty: 30 | Fill #2

## 2019-12-20 ENCOUNTER — Encounter: Payer: Self-pay | Admitting: Cardiothoracic Surgery

## 2019-12-20 ENCOUNTER — Ambulatory Visit (INDEPENDENT_AMBULATORY_CARE_PROVIDER_SITE_OTHER): Payer: Self-pay | Admitting: Cardiothoracic Surgery

## 2019-12-20 ENCOUNTER — Other Ambulatory Visit: Payer: Self-pay

## 2019-12-20 VITALS — BP 116/79 | HR 98 | Temp 98.1°F | Resp 20 | Ht 69.0 in | Wt 227.0 lb

## 2019-12-20 DIAGNOSIS — Z951 Presence of aortocoronary bypass graft: Secondary | ICD-10-CM

## 2019-12-20 DIAGNOSIS — I2510 Atherosclerotic heart disease of native coronary artery without angina pectoris: Secondary | ICD-10-CM

## 2019-12-20 NOTE — Progress Notes (Signed)
PCP is Marcine Matar, MD Referring Provider is Cora Daniels, *  Chief Complaint  Patient presents with  . Routine Post Op    s/p CABG, re-eval surgical wounds ...sternal and left radial    HPI: Patient returns for wound check of the lower sternal skin incision and the left wrist. The lower sternal skin incision is not completely healed after application of activated collagen gel. The left wrist radial artery incision still has an eschar but with less erythema no drainage and will heal eventually.  Patient is noted significant increase in her exercise tolerance energy since her bypass surgery.  She is back at work She rides her stationary bicycle daily between 5 and 20 miles.   Past Medical History:  Diagnosis Date  . Anxiety   . Arthritis    fingers  . Cervical dysplasia   . Coronary artery disease   . Depression   . Diabetes mellitus    Type II  . Dyspnea 11/22/2013  . GERD (gastroesophageal reflux disease)   . High cholesterol   . History of kidney stones    passed  . Hypertension    no meds now  . Kidney stones   . Neuropathy    feet and legs  . Nuclear sclerotic cataract of both eyes 09/2019   Dr. Shea Evans  . PONV (postoperative nausea and vomiting)   . Sleep apnea    lost 70lbs no cpap x38yrs now    Past Surgical History:  Procedure Laterality Date  . CARDIAC CATHETERIZATION     4-81yrs ago  . CERVICAL CONIZATION W/BX N/A 05/04/2018   Procedure: CONIZATION CERVIX WITH BIOPSY - COLD KNIFE;  Surgeon: Hermina Staggers, MD;  [facility];  Service: Gynecology;  Laterality: N/A;  . CHOLECYSTECTOMY    . CORONARY ARTERY BYPASS GRAFT N/A 10/24/2019   Procedure: CORONARY ARTERY BYPASS GRAFTING (CABG) times four, using left radial artery harvested endoscopically and right greater saphenous vein harvested endoscopically.;  Surgeon: Kerin Perna, MD;  Location: [facility];  Service: Open Heart Surgery;  Laterality: N/A;  . ENDOMETRIAL ABLATION      6 years ago  . INCISION AND DRAINAGE     for boil- buttocks  . INCISION AND DRAINAGE Left    Leg, Nephrotoxin fasciotimy  . IRRIGATION AND DEBRIDEMENT ABSCESS Right 03/17/2017   Procedure: IRRIGATION AND DEBRIDEMENT RIGHT THIGH ABSCESS;  Surgeon: Karie Soda, MD;  [facility];  Service: General;  Laterality: Right;  . LAPAROSCOPIC APPENDECTOMY  10/04/2011   Procedure: APPENDECTOMY LAPAROSCOPIC;  Surgeon: Rulon Abide, DO;  Location: [facility];  Service: General;  Laterality: N/A;  . LEFT HEART CATH AND CORONARY ANGIOGRAPHY N/A 10/06/2019   Procedure: LEFT HEART CATH AND CORONARY ANGIOGRAPHY;  Surgeon: [place], Hinata Diener M, MD;  Location: [facility];  Service: Cardiovascular;  Laterality: N/A;  . RADIAL ARTERY HARVEST Left 10/24/2019   Procedure: Radial Artery Harvest;  Surgeon: Kerin Perna, MD;  Location: [facility];  Service: Open Heart Surgery;  Laterality: Left;  . TEE WITHOUT CARDIOVERSION N/A 10/24/2019   Procedure: TRANSESOPHAGEAL ECHOCARDIOGRAM (TEE);  Surgeon: Donata Clay, Theron Arista, MD;  Location: [facility] OR;  Service: Open Heart Surgery;  Laterality: N/A;    Family History  Problem Relation Age of Onset  . Aneurysm Mother   . Heart attack Father   . Stroke Father   . Breast cancer Paternal Grandmother     Social History Social History   Tobacco Use  . Smoking  status: Former Smoker    Packs/day: 0.00    Years: 24.00    Pack years: 0.00    Types: E-cigarettes  . Smokeless tobacco: Current User  . Tobacco comment: vape  Substance Use Topics  . Alcohol use: Yes    Comment: rare  . Drug use: No    Current Outpatient Medications  Medication Sig Dispense Refill  . acetaminophen (TYLENOL) 500 MG tablet Take 1,000 mg by mouth every 6 (six) hours as needed for moderate pain or headache.    Marland Kitchen aspirin 325 MG EC tablet Take 1 tablet (325 mg total) by mouth daily.    Marland Kitchen atorvastatin (LIPITOR) 80 MG tablet TAKE 1 TABLET (80 MG TOTAL) BY MOUTH DAILY AT 6 PM. 30 tablet 2  .  empagliflozin (JARDIANCE) 10 MG TABS tablet Take 10 mg by mouth daily before breakfast. 30 tablet 3  . esomeprazole (NEXIUM) 20 MG capsule Take 20 mg by mouth daily.     Marland Kitchen ezetimibe (ZETIA) 10 MG tablet Take 1 tablet (10 mg total) by mouth daily. 90 tablet 3  . FLUoxetine (PROZAC) 40 MG capsule Take 40 mg by mouth daily.    . fluticasone (FLONASE) 50 MCG/ACT nasal spray Place 2 sprays into both nostrils daily as needed for allergies or rhinitis.    Marland Kitchen gabapentin (NEURONTIN) 300 MG capsule Take 3 capsules (900 mg total) by mouth 2 (two) times daily. 180 capsule 6  . glucose blood (TRUE METRIX BLOOD GLUCOSE TEST) test strip Use as instructed 100 each 12  . insulin detemir (LEVEMIR FLEXTOUCH) 100 UNIT/ML FlexPen Inject 37 Units into the skin 2 (two) times daily. 15 mL 11  . Insulin Pen Needle (B-D UF III MINI PEN NEEDLES) 31G X 5 MM MISC Use as instructed. Inject into the skin three times daily 100 each 3  . liraglutide (VICTOZA) 18 MG/3ML SOPN Inject 0.3 mLs (1.8 mg total) into the skin daily. (Patient taking differently: Inject 1.8 mg into the skin every evening. ) 9 mL 2  . Melatonin 10 MG CAPS Take 10 mg by mouth at bedtime as needed (sleep).    . metFORMIN (GLUCOPHAGE-XR) 500 MG 24 hr tablet Take 2 tablets (1,000 mg total) by mouth daily with breakfast. 60 tablet 1  . metoprolol succinate (TOPROL XL) 25 MG 24 hr tablet Take 0.5 tablets (12.5 mg total) by mouth daily. 45 tablet 3  . nystatin ointment (MYCOSTATIN) Apply 1 application topically 2 (two) times daily. 30 g 0  . TRUEPLUS SAFETY LANCETS 28G MISC Use as directed 100 each 3   No current facility-administered medications for this visit.    Allergies  Allergen Reactions  . Sulfa Antibiotics Hives  . Codeine Itching    Review of Systems   No complaints no edema   BP 116/79 (BP Location: Right Arm, Patient Position: Sitting, Cuff Size: Large)   Pulse 98   Temp 98.1 F (36.7 C) (Temporal)   Resp 20   Ht 5\' 9"  (1.753 m)   Wt 227  lb (103 kg)   LMP 10/03/2011   SpO2 99% Comment: RA  BMI 33.52 kg/m  Physical Exam      Exam    General- alert and comfortable.  Sternal incision well-healed.    Neck- no JVD, no cervical adenopathy palpable, no carotid bruit   Lungs- clear without rales, wheezes   Cor- regular rate and rhythm, no murmur , gallop   Abdomen- soft, non-tender   Extremities - warm, non-tender, minimal edema.  Left  wrist incision with small eschar dry and clean   Neuro- oriented, appropriate, no focal weakness   Diagnostic Tests: None   Impression: Patient recovering well from multivessel CABG. She will have all restrictions lifted 3 months after surgery, May 23. Plan: She understands importance of a heart healthy lifestyle including the exercise and the diet. She will will return to the care of her physicians and cardiologist and return here as needed.  Len Childs, MD [facility] [phone]

## 2019-12-28 ENCOUNTER — Other Ambulatory Visit: Payer: Self-pay

## 2019-12-28 ENCOUNTER — Ambulatory Visit: Payer: 59 | Attending: Internal Medicine | Admitting: Internal Medicine

## 2019-12-28 ENCOUNTER — Encounter: Payer: Self-pay | Admitting: Internal Medicine

## 2019-12-28 VITALS — BP 110/73 | HR 87 | Temp 97.9°F | Resp 16 | Wt 230.0 lb

## 2019-12-28 DIAGNOSIS — F331 Major depressive disorder, recurrent, moderate: Secondary | ICD-10-CM

## 2019-12-28 DIAGNOSIS — F3342 Major depressive disorder, recurrent, in full remission: Secondary | ICD-10-CM

## 2019-12-28 DIAGNOSIS — Z1211 Encounter for screening for malignant neoplasm of colon: Secondary | ICD-10-CM

## 2019-12-28 DIAGNOSIS — E1165 Type 2 diabetes mellitus with hyperglycemia: Secondary | ICD-10-CM | POA: Diagnosis not present

## 2019-12-28 DIAGNOSIS — M130 Polyarthritis, unspecified: Secondary | ICD-10-CM

## 2019-12-28 DIAGNOSIS — Z794 Long term (current) use of insulin: Secondary | ICD-10-CM | POA: Diagnosis not present

## 2019-12-28 DIAGNOSIS — I25810 Atherosclerosis of coronary artery bypass graft(s) without angina pectoris: Secondary | ICD-10-CM

## 2019-12-28 DIAGNOSIS — E1140 Type 2 diabetes mellitus with diabetic neuropathy, unspecified: Secondary | ICD-10-CM | POA: Diagnosis not present

## 2019-12-28 DIAGNOSIS — Z7189 Other specified counseling: Secondary | ICD-10-CM

## 2019-12-28 DIAGNOSIS — IMO0002 Reserved for concepts with insufficient information to code with codable children: Secondary | ICD-10-CM

## 2019-12-28 HISTORY — DX: Major depressive disorder, recurrent, moderate: F33.1

## 2019-12-28 LAB — GLUCOSE, POCT (MANUAL RESULT ENTRY): POC Glucose: 173 mg/dl — AB (ref 70–99)

## 2019-12-28 MED ORDER — GABAPENTIN 300 MG PO CAPS: ORAL_CAPSULE | ORAL | 6 refills | Status: DC

## 2019-12-28 NOTE — Progress Notes (Signed)
Patient ID: THU BAGGETT, female    DOB: 1970-11-12  MRN: [id number]  CC: Hospitalization Follow-up   Subjective: [name] is a 49 y.o. 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 She tells me that her scars have healed up well except for the one on the left wrist that is still healing. She feels so much better since having the surgery. Denies any chest pains at this time. Compliant with meds including aspirin, atorvastatin, metoprolol, Zetia  DIABETES TYPE 2 Last A1C:   Results for orders placed or performed in visit on 12/28/19  POCT glucose (manual entry)  Result Value Ref Range   POC Glucose 173 (A) 70 - 99 mg/dl    Lab Results  Component Value Date   HGBA1C 15.1 (H) 10/20/2019   Saw endocrinologist 10/2019 and medications were adjusted Med Adherence:  [x]  Yes -Levemir 37 units BID, Jardiance, Metformin, Victoza    []  No Medication side effects:  []  Yes    [x]  No Home Monitoring?  [x]  Yes once a day in mornings    []  No Home glucose results range:120-170.  This a.m was 142. He states that her blood sugars are doing much better than they were in the past Diet Adherence: Trying to stick to healthy eating habits Exercise: []  Yes    []  No Hypoglycemic episodes?: []  Yes    [x]  No Numbness of the feet? Takes Gabapentin 900 mg BID. Discomfort in legs after 9 hrs. wanting to know if we can do 3 times a day dosing Retinopathy hx? [x]  Yes    []  No Last eye exam:  Comments:  Complains of ongoing pain in her hands and feet and now in her shoulders. When I first met her back in 2019 she told me that she was diagnosed with rheumatoid arthritis at [facility] where she was previously  receiving primary care. She had never been seen by rheumatologist. She was on meloxicam at the time. We checked rheumatoid factor and anti- CCP and they were normal. Now that she has insurance she is requesting referral to rheumatology.  Depression: She reports that she is doing so much better since her heart surgery. She is now on 40 mg of Prozac instead of 60 mg.  HM:  Had COVID 09/2019. Wonders if she needs to get the vaccine. Due for colon cancer screen.  Mother died of pancreatic cancer Patient Active Problem List   Diagnosis Date Noted  . Moderate episode of recurrent major depressive disorder ([place]) 12/28/2019  . Encounter for post surgical wound check 12/06/2019  . Type 2 diabetes mellitus with diabetic polyneuropathy, with long-term current use of insulin ([place]) 11/16/2019  . Diabetes mellitus ([place]) 11/16/2019  . Type 2 diabetes mellitus with retinopathy, with long-term current use of insulin ([place]) 11/16/2019  . Dyslipidemia 11/16/2019  . Postop check 11/15/2019  . Diabetic retinopathy ([place]) 10/25/2019  . 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 Angina pectoris ([place]) 10/06/2019  . Left-sided chest pain 06/13/2019  . Post-operative state 06/06/2018  . HGSIL (high grade squamous intraepithelial lesion) on Pap smear of cervix 02/25/2018  . Right upper quadrant abdominal pain 12/30/2017  . Polyarthritis 12/30/2017  . OSA (obstructive sleep apnea) 11/22/2013  . Dyspnea 11/22/2013  . Uncontrolled type 2 diabetes mellitus  with hyperosmolar nonketotic hyperglycemia ([place]) 07/21/2012  . HTN (hypertension) 07/21/2012  . Smoker 07/21/2012     Current Outpatient Medications on File Prior to Visit  Medication Sig Dispense Refill  . acetaminophen (TYLENOL) 500 MG tablet Take 1,000 mg by mouth every 6 (six) hours as needed for moderate pain or headache.    Marland Kitchen aspirin 325 MG EC tablet Take 1 tablet (325 mg total) by mouth daily.    Marland Kitchen atorvastatin (LIPITOR) 80 MG tablet TAKE 1 TABLET (80 MG TOTAL) BY MOUTH  DAILY AT 6 PM. 30 tablet 2  . empagliflozin (JARDIANCE) 10 MG TABS tablet Take 10 mg by mouth daily before breakfast. 30 tablet 3  . esomeprazole (NEXIUM) 20 MG capsule Take 20 mg by mouth daily.     Marland Kitchen ezetimibe (ZETIA) 10 MG tablet Take 1 tablet (10 mg total) by mouth daily. 90 tablet 3  . FLUoxetine (PROZAC) 40 MG capsule Take 40 mg by mouth daily.    . fluticasone (FLONASE) 50 MCG/ACT nasal spray Place 2 sprays into both nostrils daily as needed for allergies or rhinitis.    Marland Kitchen glucose blood (TRUE METRIX BLOOD GLUCOSE TEST) test strip Use as instructed 100 each 12  . insulin detemir (LEVEMIR FLEXTOUCH) 100 UNIT/ML FlexPen Inject 37 Units into the skin 2 (two) times daily. 15 mL 11  . Insulin Pen Needle (B-D UF III MINI PEN NEEDLES) 31G X 5 MM MISC Use as instructed. Inject into the skin three times daily 100 each 3  . liraglutide (VICTOZA) 18 MG/3ML SOPN Inject 0.3 mLs (1.8 mg total) into the skin daily. (Patient taking differently: Inject 1.8 mg into the skin every evening. ) 9 mL 2  . Melatonin 10 MG CAPS Take 10 mg by mouth at bedtime as needed (sleep).    . metFORMIN (GLUCOPHAGE-XR) 500 MG 24 hr tablet Take 2 tablets (1,000 mg total) by mouth daily with breakfast. 60 tablet 1  . metoprolol succinate (TOPROL XL) 25 MG 24 hr tablet Take 0.5 tablets (12.5 mg total) by mouth daily. 45 tablet 3  . nystatin ointment (MYCOSTATIN) Apply 1 application topically 2 (two) times daily. 30 g 0  . TRUEPLUS SAFETY LANCETS 28G MISC Use as directed 100 each 3   No current facility-administered medications on file prior to visit.    Allergies  Allergen Reactions  . Sulfa Antibiotics Hives  . Codeine Itching    Social History   Socioeconomic History  . Marital status: Widowed    Spouse name: Not on file  . Number of children: 1  . Years of education: Not on file  . Highest education level: Not on file  Occupational History  . Occupation: none  Tobacco Use  . Smoking status: Former Smoker     Packs/day: 0.00    Years: 24.00    Pack years: 0.00    Types: E-cigarettes  . Smokeless tobacco: Current User  . Tobacco comment: vape  Substance and Sexual Activity  . Alcohol use: Yes    Comment: rare  . Drug use: No  . Sexual activity: Yes    Birth control/protection: None  Other Topics Concern  . Not on file  Social History Narrative  . Not on file   Social Determinants of Health   Financial Resource Strain:   . Difficulty of Paying Living Expenses:   Food Insecurity:   . Worried About Charity fundraiser in the Last Year:   . [place] in the Last Year:  Transportation Needs:   . Film/video editor (Medical):   Marland Kitchen Lack of Transportation (Non-Medical):   Physical Activity:   . Days of Exercise per Week:   . Minutes of Exercise per Session:   Stress:   . Feeling of Stress :   Social Connections:   . Frequency of Communication with Friends and Family:   . Frequency of Social Gatherings with Friends and Family:   . Attends Religious Services:   . Active Member of Clubs or Organizations:   . Attends Archivist Meetings:   Marland Kitchen Marital Status:   Intimate Partner Violence:   . Fear of Current or Ex-Partner:   . Emotionally Abused:   Marland Kitchen Physically Abused:   . Sexually Abused:     Family History  Problem Relation Age of Onset  . Aneurysm Mother   . Pancreatic cancer Mother   . Heart attack Father   . Stroke Father   . Breast cancer Paternal Grandmother     Past Surgical History:  Procedure Laterality Date  . CARDIAC CATHETERIZATION     4-27yr ago  . CERVICAL CONIZATION W/BX N/A 05/04/2018   Procedure: CONIZATION CERVIX WITH BIOPSY - COLD KNIFE;  Surgeon: EChancy Milroy MD;  [place] [facility]  Service: Gynecology;  Laterality: N/A;  . CHOLECYSTECTOMY    . CORONARY ARTERY BYPASS GRAFT N/A 10/24/2019   Procedure: CORONARY ARTERY BYPASS GRAFTING (CABG) times four, using left radial artery harvested endoscopically and right  greater saphenous vein harvested endoscopically.;  Surgeon: VIvin Poot MD;  [place] [facility]  Service: Open Heart Surgery;  Laterality: N/A;  . ENDOMETRIAL ABLATION     6 years ago  . INCISION AND DRAINAGE     for boil- buttocks  . INCISION AND DRAINAGE Left    Leg, Nephrotoxin fasciotimy  . IRRIGATION AND DEBRIDEMENT ABSCESS Right 03/17/2017   Procedure: IRRIGATION AND DEBRIDEMENT RIGHT THIGH ABSCESS;  Surgeon: GMichael Boston MD;  Location: [facility];  Service: General;  Laterality: Right;  . LAPAROSCOPIC APPENDECTOMY  10/04/2011   Procedure: APPENDECTOMY LAPAROSCOPIC;  Surgeon: BJudieth Keens DO;  Location: [facility];  Service: General;  Laterality: N/A;  . LEFT HEART CATH AND CORONARY ANGIOGRAPHY N/A 10/06/2019   Procedure: LEFT HEART CATH AND CORONARY ANGIOGRAPHY;  Surgeon: JMartinique Peter M, MD;  [facility];  Service: Cardiovascular;  Laterality: N/A;  . RADIAL ARTERY HARVEST Left 10/24/2019   Procedure: Radial Artery Harvest;  Surgeon: VIvin Poot MD;  [place] [facility]  Service: Open Heart Surgery;  Laterality: Left;  . TEE WITHOUT CARDIOVERSION N/A 10/24/2019   Procedure: TRANSESOPHAGEAL ECHOCARDIOGRAM (TEE);  Surgeon: VPrescott Gum PCollier Salina MD;  [place] [facility]  Service: Open Heart Surgery;  Laterality: N/A;    ROS: Review of Systems Negative except as stated above  PHYSICAL EXAM: BP 110/73   Pulse 87   Temp 97.9 F (36.6 C)   Resp 16   Wt 230 lb (104.3 kg)   LMP 10/03/2011   SpO2 97%   BMI 33.97 kg/m   Physical Exam  General appearance - alert, well appearing, and in no distress Mental status - normal mood, behavior, speech, dress, motor activity, and thought processes Neck - supple, no significant adenopathy Chest - clear to auscultation, no wheezes, rales or rhonchi, symmetric air entry Heart - normal rate, regular rhythm, normal S1, S2, no murmurs, rubs, clicks or gallops Musculoskeletal -mild deformity of the PIP and DIP joints of the left fifth  finger.  Mild enlargement of the DIP joints of both hands. No enlargement of the MCP joints. No significant swelling noted of the ankles or wrists.  Extremities -no lower extremity edema. Skin: She has mild erythema with scab formation from previous surgical incision on the radial aspect of the left wrist. No drainage noted Diabetic Foot Exam - Simple   Simple Foot Form Visual Inspection No deformities, no ulcerations, no other skin breakdown bilaterally: Yes Sensation Testing Intact to touch and monofilament testing bilaterally: Yes Pulse Check Posterior Tibialis and Dorsalis pulse intact bilaterally: Yes Comments     Depression screen [facility] 2/9 12/28/2019 11/16/2019 10/31/2019  Decreased Interest 0 0 1  Down, Depressed, Hopeless 0 0 1  PHQ - 2 Score 0 0 2  Altered sleeping 0 - 3  Tired, decreased energy 0 - 1  Change in appetite 0 - 3  Feeling bad or failure about yourself  0 - 1  Trouble concentrating 0 - 1  Moving slowly or fidgety/restless 0 - 1  Suicidal thoughts 0 - 0  PHQ-9 Score 0 - 12  Some recent data might be hidden    Lab Results  Component Value Date   HGBA1C 15.1 (H) 10/20/2019    CMP Latest Ref Rng & Units 11/13/2019 10/29/2019 10/28/2019  Glucose 65 - 99 mg/dL 189(H) 215(H) 167(H)  BUN 6 - 24 mg/dL 25(H) 21(H) 25(H)  Creatinine 0.57 - 1.00 mg/dL 1.24(H) 1.24(H) 1.55(H)  Sodium 134 - 144 mmol/L 139 138 139  Potassium 3.5 - 5.2 mmol/L 4.6 3.6 3.4(L)  Chloride 96 - 106 mmol/L 96 100 100  CO2 20 - 29 mmol/L 27 29 29   Calcium 8.7 - 10.2 mg/dL 10.0 8.7(L) 8.8(L)  Total Protein 6.5 - 8.1 g/dL - - -  Total Bilirubin 0.3 - 1.2 mg/dL - - -  Alkaline Phos 38 - 126 U/L - - -  AST 15 - 41 U/L - - -  ALT 0 - 44 U/L - - -   Lipid Panel     Component Value Date/Time   CHOL 164 10/02/2019 0947   TRIG 164 (H) 10/02/2019 0947   HDL 58 10/02/2019 0947   CHOLHDL 2.8 10/02/2019 0947   CHOLHDL 5.5 06/14/2019 0009   VLDL 47 (H) 06/14/2019 0009   LDLCALC 78 10/02/2019 0947     CBC    Component Value Date/Time   WBC 6.8 10/29/2019 0325   RBC 2.93 (L) 10/29/2019 0325   HGB 8.3 (L) 10/29/2019 0325   HGB 12.9 10/05/2019 1509   HCT 26.3 (L) 10/29/2019 0325   HCT 39.7 10/05/2019 1509   PLT 242 10/29/2019 0325   PLT 203 10/05/2019 1509   MCV 89.8 10/29/2019 0325   MCV 85 10/05/2019 1509   MCH 28.3 10/29/2019 0325   MCHC 31.6 10/29/2019 0325   RDW 13.4 10/29/2019 0325   RDW 13.5 10/05/2019 1509   LYMPHSABS 2.2 06/05/2019 1039   MONOABS 0.4 07/07/2017 0830   EOSABS 0.0 06/05/2019 1039   BASOSABS 0.0 06/05/2019 1039    ASSESSMENT AND PLAN: 1. Uncontrolled type 2 diabetes mellitus with diabetic neuropathy, with long-term current use of insulin [facility]) Being followed by endocrinology. Blood sugars improving. She will continue her current regimen. In regards to the neuropathy, we will change the gabapentin to 900 mg in the morning and evenings and 300 mg in the afternoon. - POCT glucose (manual entry) - gabapentin (NEURONTIN) 300 MG capsule; 3 caps PO  in the morning, 1 in the afternoon and 3 capsules  at night  Dispense: 210 capsule; Refill: 6  2. Polyarthritis - Ambulatory referral to Rheumatology  3. Coronary artery disease involving coronary bypass graft of native heart without angina pectoris Clinically stable. She will continue current medications  4. Recurrent major depressive disorder, in full remission ([place]) Doing well on Prozac.  5. Screening for colon cancer We discussed colon cancer screening. She would prefer the Cologuard rather than colonoscopy. - Cologuard  6. Educated about COVID-19 virus infection Advised patient that even though she has had COVID-19 infection, it is still recommended that people get the COVID-19 vaccine about 3 months after having the infection because it is thought that immunity does wean. She will consider getting the COVID-19 vaccine.    Patient was given the opportunity to ask questions.  Patient verbalized  understanding of the plan and was able to repeat key elements of the plan.   Orders Placed This Encounter  Procedures  . Cologuard  . Ambulatory referral to Rheumatology  . POCT glucose (manual entry)     Requested Prescriptions   Signed Prescriptions Disp Refills  . gabapentin (NEURONTIN) 300 MG capsule 210 capsule 6    Sig: 3 caps PO  in the morning, 1 in the afternoon and 3 capsules at night    Return in about 4 months (around 04/28/2020).  Karle Plumber, MD, FACP

## 2020-01-09 ENCOUNTER — Other Ambulatory Visit: Payer: Self-pay

## 2020-01-11 ENCOUNTER — Ambulatory Visit: Payer: 59 | Admitting: Internal Medicine

## 2020-01-15 ENCOUNTER — Telehealth: Payer: Self-pay | Admitting: Internal Medicine

## 2020-01-15 MED ORDER — FLUOXETINE HCL 40 MG PO CAPS: 40.0000 mg | ORAL_CAPSULE | Freq: Every day | ORAL | 2 refills | Status: DC

## 2020-01-15 NOTE — Telephone Encounter (Signed)
Rx sent 

## 2020-01-15 NOTE — Telephone Encounter (Signed)
Patient called in and requested for listed medication to be refilled and sent to [id number] [place], [place] - [phone] AT [facility] [address] [facility] [address]  [address] [place] [address]  FLUoxetine (PROZAC) 40 MG capsule [[id number]]

## 2020-01-24 ENCOUNTER — Telehealth: Payer: Self-pay

## 2020-01-24 MED ORDER — ATORVASTATIN CALCIUM 80 MG PO TABS: 80.0000 mg | ORAL_TABLET | Freq: Every day | ORAL | 0 refills | Status: DC

## 2020-01-24 NOTE — Telephone Encounter (Signed)
Rx sent 

## 2020-01-24 NOTE — Telephone Encounter (Signed)
1) Medication(s) Requested (by name): LIPITOR.  No refills left.   2) Pharmacy of Choice:  [facility] [address]  3) Special Requests: Pt switched to them. They are her new preferred pharmacy.   Approved medications will be sent to the pharmacy, we will reach out if there is an issue.  Requests made after 3pm may not be addressed until the following business day!  If a patient is unsure of the name of the medication(s) please note and ask patient to call back when they are able to provide all info, do not send to responsible party until all information is available!

## 2020-02-09 ENCOUNTER — Other Ambulatory Visit: Payer: Self-pay | Admitting: Internal Medicine

## 2020-02-09 DIAGNOSIS — E1140 Type 2 diabetes mellitus with diabetic neuropathy, unspecified: Secondary | ICD-10-CM

## 2020-02-09 DIAGNOSIS — E1165 Type 2 diabetes mellitus with hyperglycemia: Secondary | ICD-10-CM

## 2020-02-09 DIAGNOSIS — IMO0002 Reserved for concepts with insufficient information to code with codable children: Secondary | ICD-10-CM

## 2020-02-11 NOTE — Progress Notes (Signed)
Cardiology Office Note:    Date:  02/13/2020   ID:  [name], DOB 09-08-1970, MRN [id number]  PCP:  Ladell Pier, MD  Cardiologist:  Donato Heinz, MD  Electrophysiologist:  None   Referring MD: Ladell Pier, MD   Chief Complaint  Patient presents with  . Coronary Artery Disease   History of Present Illness:    [name] is a 49 y.o. female with a hx of coronary artery disease status post CABG x4 on 10/24/2019 (LIMA-LAD, radial-diagonal, SVG-diagonal, SVG-PDA), type 2 diabetes, hypertension, hyperlipidemia who presents for a follow-up evaluation.  Patient was admitted to [facility] on 06/13/19 with chest pain.  EKG was unremarkable and high-sensitivity troponins were negative.  However due to her extensive risk factors including uncontrolled diabetes, coronary CTA was ordered.  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  She was discharged on medical management: atorvastatin 80 mg, Imdur 30 mg.  TTE showed EF 60-65%.  During subsequent office visits, her antianginal regimen was titrated as she continued to report chest pain.  At last clinic visit on 10/02/2019, she was on metoprolol 25 mg twice daily and Imdur 60 mg daily, but further titration was limited by low blood pressures.  Given inability to further titrate up antianginals and having persistent chest pain, cardiac catheterization was ordered.  Underwent cath on 10/06/2019, which showed three-vessel CAD: 80% mid LAD, 85% slitlike ostial D1, 80% ostial D2, 80% small PDA, anomalous circumflex off the RCA cusp with moderate diffuse disease.  Recommendation was for aggressive medical management and if symptoms  persist consider surgical consultation.  She continued to have chest pain despite medical management, so was referred to cardiac surgery.  She underwent CABG x4 on 10/24/2019 (LIMA-LAD, radial-diagonal, SVG-diagonal, SVG-PDA) with Dr. Prescott Gum.  Since last clinic visit, she reports that she has been doing well.  She had one episode of chest pain 2 weeks ago, was stressed during a family visit.  She states she has been exercising regularly and not having any chest pain or dyspnea.  She has been riding a stationary bicycle for 30 minutes/day.  Reports her wounds from her CABG are healing well.    Past Medical History:  Diagnosis Date  . Anxiety   . Arthritis    fingers  . Cervical dysplasia   . Coronary artery disease   . Depression   . Diabetes mellitus    Type II  . Dyspnea 11/22/2013  . GERD (gastroesophageal reflux disease)   . High cholesterol   . History of kidney stones    passed  . Hypertension    no meds now  . Kidney stones   . Moderate episode of recurrent major depressive disorder ([place]) 12/28/2019  . Neuropathy    feet and legs  . Nuclear sclerotic cataract of both eyes 09/2019   Dr. Idolina Primer  . PONV (postoperative nausea and vomiting)   . Sleep apnea    lost 70lbs no cpap x63yrs now    Past Surgical History:  Procedure Laterality Date  . CARDIAC CATHETERIZATION     4-32yrs ago  . CERVICAL CONIZATION W/BX N/A 05/04/2018   Procedure: CONIZATION CERVIX WITH BIOPSY - COLD KNIFE;  Surgeon: Rip Harbour,  Marolyn Hammock, MD;  Location: [facility];  Service: Gynecology;  Laterality: N/A;  . CHOLECYSTECTOMY    . CORONARY ARTERY BYPASS GRAFT N/A 10/24/2019   Procedure: CORONARY ARTERY BYPASS GRAFTING (CABG) times four, using left radial artery harvested endoscopically and right greater saphenous vein harvested endoscopically.;  Surgeon: Kerin Perna, MD;  Location: [facility] OR;  Service: Open Heart Surgery;  Laterality: N/A;  . ENDOMETRIAL ABLATION     6 years ago  . INCISION AND  DRAINAGE     for boil- buttocks  . INCISION AND DRAINAGE Left    Leg, Nephrotoxin fasciotimy  . IRRIGATION AND DEBRIDEMENT ABSCESS Right 03/17/2017   Procedure: IRRIGATION AND DEBRIDEMENT RIGHT THIGH ABSCESS;  Surgeon: Karie Soda, MD;  Location: [facility] ORS;  Service: General;  Laterality: Right;  . LAPAROSCOPIC APPENDECTOMY  10/04/2011   Procedure: APPENDECTOMY LAPAROSCOPIC;  Surgeon: Rulon Abide, DO;  Location: [facility]S;  Service: General;  Laterality: N/A;  . LEFT HEART CATH AND CORONARY ANGIOGRAPHY N/A 10/06/2019   Procedure: LEFT HEART CATH AND CORONARY ANGIOGRAPHY;  Surgeon: Swaziland, Peter M, MD;  Location: [facility] INVASIVE CV LAB;  Service: Cardiovascular;  Laterality: N/A;  . RADIAL ARTERY HARVEST Left 10/24/2019   Procedure: Radial Artery Harvest;  Surgeon: Kerin Perna, MD;  Location: [facility] OR;  Service: Open Heart Surgery;  Laterality: Left;  . TEE WITHOUT CARDIOVERSION N/A 10/24/2019   Procedure: TRANSESOPHAGEAL ECHOCARDIOGRAM (TEE);  Surgeon: Donata Clay, Theron Arista, MD;  Location: [facility] OR;  Service: Open Heart Surgery;  Laterality: N/A;    Current Medications: No outpatient medications have been marked as taking for the 02/13/20 encounter (Office Visit) with Little Ishikawa, MD.     Allergies:   Sulfa antibiotics and Codeine   Social History   Socioeconomic History  . Marital status: Widowed    Spouse name: Not on file  . Number of children: 1  . Years of education: Not on file  . Highest education level: Not on file  Occupational History  . Occupation: none  Tobacco Use  . Smoking status: Former Smoker    Packs/day: 0.00    Years: 24.00    Pack years: 0.00    Types: E-cigarettes  . Smokeless tobacco: Current User  . Tobacco comment: vape  Vaping Use  . Vaping Use: Every day  . Devices: 1 pod last 3-4 days  Substance and Sexual Activity  . Alcohol use: Yes    Comment: rare  . Drug use: No  . Sexual activity: Yes    Birth control/protection: None  Other Topics  Concern  . Not on file  Social History Narrative  . Not on file   Social Determinants of Health   Financial Resource Strain:   . Difficulty of Paying Living Expenses:   Food Insecurity:   . Worried About Programme researcher, broadcasting/film/video in the Last Year:   . Barista in the Last Year:   Transportation Needs:   . Freight forwarder (Medical):   Marland Kitchen Lack of Transportation (Non-Medical):   Physical Activity:   . Days of Exercise per Week:   . Minutes of Exercise per Session:   Stress:   . Feeling of Stress :   Social Connections:   . Frequency of Communication with Friends and Family:   . Frequency of Social Gatherings with Friends and Family:   . Attends Religious Services:   . Active Member of Clubs or Organizations:   . Attends Banker Meetings:   .  Marital Status:      Family History: The patient's family history includes Aneurysm in her mother; Breast cancer in her paternal grandmother; Heart attack in her father; Pancreatic cancer in her mother; Stroke in her father.  ROS:   Please see the history of present illness.     All other systems reviewed and are negative.  EKGs/Labs/Other Studies Reviewed:    The following studies were reviewed today:   EKG:  EKG is ordered today.  The ekg ordered today demonstrates normal sinus rhythm, rate 79, right bundle branch block, nonspecific T wave flattening   TTE 06/14/19:  1. Left ventricular ejection fraction, by visual estimation, is 60 to 65%. The left ventricle has normal function. Normal left ventricular size. There is no left ventricular hypertrophy.  2. Left ventricular diastolic Doppler parameters are consistent with impaired relaxation pattern of LV diastolic filling.  3. Global right ventricle has normal systolic function.The right ventricular size is normal. No increase in right ventricular wall thickness.  4. Left atrial size was normal.  5. Right atrial size was normal.  6. The mitral valve is normal in  structure. No evidence of mitral valve regurgitation. No evidence of mitral stenosis.  7. The tricuspid valve is normal in structure. Tricuspid valve regurgitation was not visualized by color flow Doppler.  8. The aortic valve is normal in structure. Aortic valve regurgitation was not visualized by color flow Doppler. Structurally normal aortic valve, with no evidence of sclerosis or stenosis.  9. The pulmonic valve was normal in structure. Pulmonic valve regurgitation is not visualized by color flow Doppler. 10. The inferior vena cava is normal in size with greater than 50% respiratory variability, suggesting right atrial pressure of 3 mmHg.  Coronary CT 06/15/19: 1. Coronary calcium score of 81. This was 74 percentile for age and sex matched control. 2. 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 Noncalcified plaque in mid LCX appears to cause severe stenosis (70-99%) but vessel is small (<13mm) 3. Calcified plaque in the proximal LAD causes 25-49% stenosis  CTFFR 06/15/19: 1. CT-FFR across lesion in proximal LAD is 0.92, suggesting lesion is not functionally significant 2. CT-FFR across lesion in ostial D2 is 0.62, suggesting functional significance 3. CT-FFR across lesion in anomalous circumflex is 0.65, suggesting functional significance  Cath 10/06/19:  Prox Cx lesion is 60% stenosed.  Mid Cx lesion is 70% stenosed.  RPDA lesion is 80% stenosed.  Mid LM to Ost LAD lesion is 35% stenosed.  1st Diag lesion is 85% stenosed.  2nd Diag lesion is 80% stenosed.  Mid LAD lesion is 80% stenosed.  LV end diastolic pressure is normal.   1. Three vessel CAD.    - 80% mid LAD after the second diagonal    - 85% slit like stenosis at the origin of the first diagonal- best seen in the LAO caudal shot.    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 Normal  LVEDP  Plan: reviewed with interventional colleagues. Her anatomy doesn't offer good PCI options except the mid LAD. Treating the ostial disease in the diagonal branches would likely require stenting into the LAD. The LCx and PDA are too small for PCI. I would aggressively treat medically. If symptoms persist I would consider surgical consultation.     Recent Labs: 10/20/2019: ALT 19 10/29/2019: Hemoglobin 8.3; Platelets 242  11/13/2019: BUN 25; Creatinine, Ser 1.24; Magnesium 1.6; Potassium 4.6; Sodium 139  Recent Lipid Panel    Component Value Date/Time   CHOL 164 10/02/2019 0947   TRIG 164 (H) 10/02/2019 0947   HDL 58 10/02/2019 0947   CHOLHDL 2.8 10/02/2019 0947   CHOLHDL 5.5 06/14/2019 0009   VLDL 47 (H) 06/14/2019 0009   LDLCALC 78 10/02/2019 0947    Physical Exam:    VS:  BP 130/81   Pulse 79   Temp (!) 95.2 F (35.1 C)   Ht 5\' 8"  (1.727 m)   Wt 233 lb 12.8 oz (106.1 kg)   LMP 10/03/2011   SpO2 96%   BMI 35.55 kg/m     Wt Readings from Last 3 Encounters:  02/13/20 233 lb 12.8 oz (106.1 kg)  12/28/19 230 lb (104.3 kg)  12/20/19 227 lb (103 kg)    GEN:  in no acute distress HEENT: Normal NECK: No JVD LYMPHATICS: No lymphadenopathy CARDIAC: RRR, no murmurs, rubs, gallops RESPIRATORY:  Clear to auscultation without rales, wheezing or rhonchi  ABDOMEN: Soft, non-tender, non-distended MUSCULOSKELETAL:  No edema; No deformity  SKIN: Warm and dry.   NEUROLOGIC:  Alert and oriented x 3 PSYCHIATRIC:  Normal affect   ASSESSMENT:    1. CAD in native artery   2. Hyperlipidemia, unspecified hyperlipidemia type   3. Tobacco use   4. 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   Status post CABG x4 on 10/24/2019  (LIMA-LAD, radial-diagonal, SVG-diagonal, SVG-PDA).  Normal LV systolic function.   -Continue aspirin 325 mg daily  -Continue atorvastatin 80 mg daily.  LDL 78 on 10/02/19, added zetia 10 mg daily.  Will check lipid panel to ensure LDL less than 70. -Continue Toprol-XL 12.5 mg daily  Hyperlipidemia: LDL 204 on 06/14/19.  Started on atorvastatin 80 mg at that time.  LDL 78 on 10/02/19, added zetia 10 mg daily.  Will repeat lipid panel.  Tobacco use: smoked 2ppd x 30 years, has quit smoking but now vaping.  Have discussed risks of vaping and cessation strongly recommended  Type 2 diabetes: A1c 15.1 on 10/20/2019. On insulin, victoza, metformin.  Follows with endocrinology.  Will refer to nutrition.  RTC in 6 months   Medication Adjustments/Labs and Tests Ordered: Current medicines are reviewed at length with the patient today.  Concerns regarding medicines are outlined above.  Orders Placed This Encounter  Procedures  . Lipid panel  . EKG 12-Lead   No orders of the defined types were placed in this encounter.   Patient Instructions  Medication Instructions:  No Changes *If you need a refill on your cardiac medications before your next appointment, please call your pharmacy*   Lab Work: Lipid Panel If you have labs (blood work) drawn today and your tests are completely normal, you will receive your results only by: 10/22/2019 MyChart Message (if you have MyChart) OR . A paper copy in the mail If you have any lab test that is abnormal or we need to change your treatment, we will call you to review the results.   Testing/Procedures: None   Follow-Up: At [facility], you and your health needs are our priority.  As part of our continuing mission to provide you with exceptional  heart care, we have created designated Provider Care Teams.  These Care Teams include your primary Cardiologist (physician) and Advanced Practice Providers (APPs -  Physician Assistants and Nurse Practitioners) who all  work together to provide you with the care you need, when you need it.  Your next appointment:   6 month(s)  The format for your next appointment:   In Person  Provider:   Epifanio Lesches, MD   Other Instructions Referral to Care Guide    Signed, Little Ishikawa, MD  02/13/2020 10:31 PM    [facility]

## 2020-02-13 ENCOUNTER — Other Ambulatory Visit: Payer: Self-pay

## 2020-02-13 ENCOUNTER — Ambulatory Visit (INDEPENDENT_AMBULATORY_CARE_PROVIDER_SITE_OTHER): Payer: 59 | Admitting: Cardiology

## 2020-02-13 VITALS — BP 130/81 | HR 79 | Temp 95.2°F | Ht 68.0 in | Wt 233.8 lb

## 2020-02-13 DIAGNOSIS — E118 Type 2 diabetes mellitus with unspecified complications: Secondary | ICD-10-CM | POA: Diagnosis not present

## 2020-02-13 DIAGNOSIS — Z720 Tobacco use: Secondary | ICD-10-CM

## 2020-02-13 DIAGNOSIS — E785 Hyperlipidemia, unspecified: Secondary | ICD-10-CM

## 2020-02-13 DIAGNOSIS — I2510 Atherosclerotic heart disease of native coronary artery without angina pectoris: Secondary | ICD-10-CM | POA: Diagnosis not present

## 2020-02-13 DIAGNOSIS — Z794 Long term (current) use of insulin: Secondary | ICD-10-CM

## 2020-02-13 NOTE — Patient Instructions (Signed)
Medication Instructions:  No Changes *If you need a refill on your cardiac medications before your next appointment, please call your pharmacy*   Lab Work: Lipid Panel If you have labs (blood work) drawn today and your tests are completely normal, you will receive your results only by: Marland Kitchen MyChart Message (if you have MyChart) OR . A paper copy in the mail If you have any lab test that is abnormal or we need to change your treatment, we will call you to review the results.   Testing/Procedures: None   Follow-Up: At [facility], you and your health needs are our priority.  As part of our continuing mission to provide you with exceptional heart care, we have created designated Provider Care Teams.  These Care Teams include your primary Cardiologist (physician) and Advanced Practice Providers (APPs -  Physician Assistants and Nurse Practitioners) who all work together to provide you with the care you need, when you need it.  Your next appointment:   6 month(s)  The format for your next appointment:   In Person  Provider:   Epifanio Lesches, MD   Other Instructions Referral to Care Guide

## 2020-02-15 ENCOUNTER — Telehealth: Payer: Self-pay

## 2020-02-15 NOTE — Telephone Encounter (Signed)
Called patient to discuss potential benefit of health coaching to aid in positive eating behaviors. Patient is interested in meal planning and losing approximately 30 pounds within 6 months, while keeping their glucose and blood pressure under control.  Patient has been scheduled for an initial appointment with Care Guide/Health Coach on 02/22/20 at 3:30pm.

## 2020-02-19 ENCOUNTER — Other Ambulatory Visit: Payer: Self-pay | Admitting: Internal Medicine

## 2020-02-19 DIAGNOSIS — E1165 Type 2 diabetes mellitus with hyperglycemia: Secondary | ICD-10-CM

## 2020-02-19 DIAGNOSIS — IMO0002 Reserved for concepts with insufficient information to code with codable children: Secondary | ICD-10-CM

## 2020-02-19 DIAGNOSIS — E1140 Type 2 diabetes mellitus with diabetic neuropathy, unspecified: Secondary | ICD-10-CM

## 2020-02-20 ENCOUNTER — Ambulatory Visit
Admission: EM | Admit: 2020-02-20 | Discharge: 2020-02-20 | Disposition: A | Discharge status: Home / Self Care | Payer: 59 | Attending: Physician Assistant | Admitting: Physician Assistant

## 2020-02-20 ENCOUNTER — Telehealth: Payer: 59 | Admitting: Family

## 2020-02-20 ENCOUNTER — Other Ambulatory Visit: Payer: Self-pay

## 2020-02-20 ENCOUNTER — Ambulatory Visit (INDEPENDENT_AMBULATORY_CARE_PROVIDER_SITE_OTHER): Payer: 59

## 2020-02-20 ENCOUNTER — Inpatient Hospital Stay: Admission: RE | Admit: 2020-02-20 | Payer: 59 | Source: Ambulatory Visit

## 2020-02-20 ENCOUNTER — Ambulatory Visit
Admission: RE | Admit: 2020-02-20 | Discharge: 2020-02-20 | Disposition: A | Discharge status: Home / Self Care | Payer: 59 | Source: Ambulatory Visit

## 2020-02-20 ENCOUNTER — Encounter: Payer: Self-pay | Admitting: Emergency Medicine

## 2020-02-20 DIAGNOSIS — R0602 Shortness of breath: Secondary | ICD-10-CM

## 2020-02-20 DIAGNOSIS — R531 Weakness: Secondary | ICD-10-CM

## 2020-02-20 DIAGNOSIS — J069 Acute upper respiratory infection, unspecified: Secondary | ICD-10-CM

## 2020-02-20 DIAGNOSIS — R5383 Other fatigue: Secondary | ICD-10-CM

## 2020-02-20 DIAGNOSIS — Z20822 Contact with and (suspected) exposure to covid-19: Secondary | ICD-10-CM

## 2020-02-20 DIAGNOSIS — R110 Nausea: Secondary | ICD-10-CM

## 2020-02-20 LAB — CBC WITH DIFFERENTIAL/PLATELET
Basophils Absolute: 0.1 10*3/uL (ref 0.0–0.2)
Basos: 1 %
EOS (ABSOLUTE): 0 10*3/uL (ref 0.0–0.4)
Eos: 1 %
Hematocrit: 49.5 % — ABNORMAL HIGH (ref 34.0–46.6)
Hemoglobin: 15.8 g/dL (ref 11.1–15.9)
Immature Grans (Abs): 0 10*3/uL (ref 0.0–0.1)
Immature Granulocytes: 0 %
Lymphocytes Absolute: 3 10*3/uL (ref 0.7–3.1)
Lymphs: 44 %
MCH: 25.2 pg — ABNORMAL LOW (ref 26.6–33.0)
MCHC: 31.9 g/dL (ref 31.5–35.7)
MCV: 79 fL (ref 79–97)
Monocytes Absolute: 0.3 10*3/uL (ref 0.1–0.9)
Monocytes: 5 %
Neutrophils Absolute: 3.5 10*3/uL (ref 1.4–7.0)
Neutrophils: 49 %
Platelets: 285 10*3/uL (ref 150–450)
RBC: 6.28 x10E6/uL — ABNORMAL HIGH (ref 3.77–5.28)
RDW: 15 % (ref 11.7–15.4)
WBC: 6.9 10*3/uL (ref 3.4–10.8)

## 2020-02-20 LAB — COMPREHENSIVE METABOLIC PANEL
ALT: 27 IU/L (ref 0–32)
AST: 20 IU/L (ref 0–40)
Albumin/Globulin Ratio: 1.7 (ref 1.2–2.2)
Albumin: 4.4 g/dL (ref 3.8–4.8)
Alkaline Phosphatase: 126 IU/L — ABNORMAL HIGH (ref 48–121)
BUN/Creatinine Ratio: 20 (ref 9–23)
BUN: 25 mg/dL — ABNORMAL HIGH (ref 6–24)
Bilirubin Total: 0.2 mg/dL (ref 0.0–1.2)
CO2: 24 mmol/L (ref 20–29)
Calcium: 10.2 mg/dL (ref 8.7–10.2)
Chloride: 99 mmol/L (ref 96–106)
Creatinine, Ser: 1.27 mg/dL — ABNORMAL HIGH (ref 0.57–1.00)
GFR calc Af Amer: 57 mL/min/{1.73_m2} — ABNORMAL LOW (ref >59)
GFR calc non Af Amer: 49 mL/min/{1.73_m2} — ABNORMAL LOW (ref >59)
Globulin, Total: 2.6 g/dL (ref 1.5–4.5)
Glucose: 127 mg/dL — ABNORMAL HIGH (ref 65–99)
Potassium: 4.2 mmol/L (ref 3.5–5.2)
Sodium: 141 mmol/L (ref 134–144)
Total Protein: 7 g/dL (ref 6.0–8.5)

## 2020-02-20 LAB — POCT FASTING CBG KUC MANUAL ENTRY: POCT Glucose (KUC): 131 mg/dL — AB (ref 70–99)

## 2020-02-20 MED ORDER — SODIUM CHLORIDE 0.9 % IV BOLUS
1000.0000 mL | Freq: Once | INTRAVENOUS | Status: AC
  Administered 2020-02-20: 1000 mL via INTRAVENOUS

## 2020-02-20 MED ORDER — BENZONATATE 200 MG PO CAPS
200.0000 mg | ORAL_CAPSULE | Freq: Three times a day (TID) | ORAL | 0 refills | Status: DC
Start: 2020-02-20 — End: 2020-04-17

## 2020-02-20 MED ORDER — ONDANSETRON 4 MG PO TBDP
4.0000 mg | ORAL_TABLET | Freq: Three times a day (TID) | ORAL | 0 refills | Status: DC | PRN
Start: 2020-02-20 — End: 2020-04-17

## 2020-02-20 MED ORDER — FLUCONAZOLE 150 MG PO TABS
150.0000 mg | ORAL_TABLET | Freq: Every day | ORAL | 0 refills | Status: DC
Start: 2020-02-20 — End: 2020-04-17

## 2020-02-20 MED ORDER — ALBUTEROL SULFATE HFA 108 (90 BASE) MCG/ACT IN AERS: 1.0000 | INHALATION_SPRAY | Freq: Four times a day (QID) | RESPIRATORY_TRACT | 0 refills | Status: DC | PRN

## 2020-02-20 MED ORDER — AZELASTINE HCL 0.1 % NA SOLN
2.0000 | Freq: Two times a day (BID) | NASAL | 0 refills | Status: DC
Start: 2020-02-20 — End: 2020-10-24

## 2020-02-20 NOTE — ED Triage Notes (Signed)
Patient had covid in January 2021.  Patient was waiting a full 6 months prior to getting the covid vaccines-plans to get in early July 2021

## 2020-02-20 NOTE — ED Triage Notes (Signed)
Patient is tired, has body aches, has a headache .  This started Sunday night.  Nausea continued yesterday.  Denies vomiting.  Patient is not nauseated at this point.  Has ear pain and sinus pressure

## 2020-02-20 NOTE — ED Provider Notes (Signed)
[facility]    CSN: [id number] Arrival date & time: 02/20/20  1602      History   Chief Complaint Chief Complaint  Patient presents with  . Shortness of Breath    HPI LESHIA KOPE is a 49 y.o. female.  49 year old female with history of uncontrolled DM, CAD s/p CABG x 4 10/24/2019, HTN, HLD comes in for evaluation after video visit with me few hours ago. Patient has had 3 day history of URI symptoms with sinus pressure, ear pain, body ache, fatigue. She has shortness of breath, DOE. Decreased appetite. Nausea without vomiting. Denies diarrhea, abdominal pain. tmax 100 with subjective fever and chills. Denies chest pain, orthopnea, leg swelling. No active bleeding. States had similar symptoms in 09/2019, and was positive for COVID then.     Past Medical History:  Diagnosis Date  . Anxiety   . Arthritis    fingers  . Cervical dysplasia   . Coronary artery disease   . Depression   . Diabetes mellitus    Type II  . Dyspnea 11/22/2013  . GERD (gastroesophageal reflux disease)   . High cholesterol   . History of kidney stones    passed  . Hypertension    no meds now  . Kidney stones   . Moderate episode of recurrent major depressive disorder (HCC) 12/28/2019  . Neuropathy    feet and legs  . Nuclear sclerotic cataract of both eyes 09/2019   Dr. Shea Evans  . PONV (postoperative nausea and vomiting)   . Sleep apnea    lost 70lbs no cpap x61yrs now    Patient Active Problem List   Diagnosis Date Noted  . Moderate episode of recurrent major depressive disorder (HCC) 12/28/2019  . Encounter for post surgical wound check 12/06/2019  . Type 2 diabetes mellitus with diabetic polyneuropathy, with long-term current use of insulin (HCC) 11/16/2019  . Diabetes mellitus (HCC) 11/16/2019  . Type 2 diabetes mellitus with retinopathy, with long-term current use of insulin (HCC) 11/16/2019  . Dyslipidemia 11/16/2019  . Postop check 11/15/2019  . Diabetic retinopathy (HCC)  10/25/2019  . 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 Angina pectoris (HCC) 10/06/2019  . Left-sided chest pain 06/13/2019  . Post-operative state 06/06/2018  . HGSIL (high grade squamous intraepithelial lesion) on Pap smear of cervix 02/25/2018  . Right upper quadrant abdominal pain 12/30/2017  . Polyarthritis 12/30/2017  . OSA (obstructive sleep apnea) 11/22/2013  . Dyspnea 11/22/2013  . Uncontrolled type 2 diabetes mellitus with hyperosmolar nonketotic hyperglycemia (HCC) 07/21/2012  . HTN (hypertension) 07/21/2012  . Smoker 07/21/2012    Past Surgical History:  Procedure Laterality Date  . CARDIAC CATHETERIZATION     4-77yrs ago  . CERVICAL CONIZATION W/BX N/A 05/04/2018   Procedure: CONIZATION CERVIX WITH BIOPSY - COLD KNIFE;  Surgeon: Hermina Staggers, MD;  Location: [facility];  Service: Gynecology;  Laterality: N/A;  . CHOLECYSTECTOMY    . CORONARY ARTERY BYPASS GRAFT N/A 10/24/2019   Procedure: CORONARY ARTERY BYPASS GRAFTING (CABG) times four, using left radial artery harvested endoscopically and right greater saphenous vein harvested endoscopically.;  Surgeon: Kerin Perna, MD;  Location: [facility] OR;  Service: Open Heart Surgery;  Laterality: N/A;  . ENDOMETRIAL ABLATION     6 years ago  . INCISION AND DRAINAGE     for boil- buttocks  . INCISION AND DRAINAGE Left    Leg, Nephrotoxin fasciotimy  . IRRIGATION AND DEBRIDEMENT ABSCESS Right 03/17/2017  Procedure: IRRIGATION AND DEBRIDEMENT RIGHT THIGH ABSCESS;  Surgeon: Karie Soda, MD;  Location: [facility];  Service: General;  Laterality: Right;  . LAPAROSCOPIC APPENDECTOMY  10/04/2011   Procedure: APPENDECTOMY LAPAROSCOPIC;  Surgeon: Rulon Abide, DO;  Location: [facility];  Service: General;  Laterality: N/A;  . LEFT HEART CATH AND CORONARY ANGIOGRAPHY N/A 10/06/2019   Procedure: LEFT HEART CATH AND CORONARY ANGIOGRAPHY;  Surgeon: Swaziland, Peter M, MD;  Location: [facility] INVASIVE CV LAB;  Service: Cardiovascular;  Laterality: N/A;    . RADIAL ARTERY HARVEST Left 10/24/2019   Procedure: Radial Artery Harvest;  Surgeon: Kerin Perna, MD;  Location[facility] OR;  Service: Open Heart Surgery;  Laterality: Left;  . TEE WITHOUT CARDIOVERSION N/A 10/24/2019   Procedure: TRANSESOPHAGEAL ECHOCARDIOGRAM (TEE);  Surgeon: Donata Clay, Theron Arista, MD;  Location: [facility] OR;  Service: Open Heart Surgery;  Laterality: N/A;    OB History    Gravida  3   Para  1   Term  0   Preterm  0   AB  2   Living  1     SAB  2   TAB  0   Ectopic  0   Multiple  0   Live Births  1            Home Medications    Prior to Admission medications   Medication Sig Start Date End Date Taking? Authorizing Provider  aspirin 325 MG EC tablet Take 1 tablet (325 mg total) by mouth daily. 10/29/19  Yes Gold, Wayne E, PA-C  atorvastatin (LIPITOR) 80 MG tablet Take 1 tablet (80 mg total) by mouth daily at 6 PM. 01/24/20  Yes Marcine Matar, MD  empagliflozin (JARDIANCE) 10 MG TABS tablet Take 10 mg by mouth daily before breakfast. 11/21/19  Yes Shamleffer, Konrad Dolores, MD  esomeprazole (NEXIUM) 20 MG capsule Take 20 mg by mouth daily.    Yes [provider]  FLUoxetine (PROZAC) 40 MG capsule Take 1 capsule (40 mg total) by mouth daily. 01/15/20  Yes Marcine Matar, MD  gabapentin (NEURONTIN) 300 MG capsule 3 caps PO  in the morning, 1 in the afternoon and 3 capsules at night 12/28/19  Yes Marcine Matar, MD  insulin detemir (LEVEMIR FLEXTOUCH) 100 UNIT/ML FlexPen Inject 37 Units into the skin 2 (two) times daily. 11/16/19  Yes Shamleffer, Konrad Dolores, MD  liraglutide (VICTOZA) 18 MG/3ML SOPN Inject 0.3 mLs (1.8 mg total) into the skin daily. Patient taking differently: Inject 1.8 mg into the skin every evening.  07/13/19  Yes Marcine Matar, MD  metFORMIN (GLUCOPHAGE-XR) 500 MG 24 hr tablet Take 2 tablets (1,000 mg total) by mouth daily with breakfast. 10/22/19  Yes Marcine Matar, MD  metoprolol succinate (TOPROL XL) 25 MG  24 hr tablet Take 0.5 tablets (12.5 mg total) by mouth daily. 11/13/19  Yes Little Ishikawa, MD  acetaminophen (TYLENOL) 500 MG tablet Take 1,000 mg by mouth every 6 (six) hours as needed for moderate pain or headache.    [provider]  albuterol (VENTOLIN HFA) 108 (90 Base) MCG/ACT inhaler Inhale 1-2 puffs into the lungs every 6 (six) hours as needed for wheezing or shortness of breath. 02/20/20   Cathie Hoops, Jushua Waltman V, PA-C  azelastine (ASTELIN) 0.1 % nasal spray Place 2 sprays into both nostrils 2 (two) times daily. 02/20/20   Cathie Hoops, Tawana Pasch V, PA-C  benzonatate (TESSALON) 200 MG capsule Take 1 capsule (200 mg total) by mouth every 8 (eight)  hours. 02/20/20   Linward Headland V, PA-C  ezetimibe (ZETIA) 10 MG tablet Take 1 tablet (10 mg total) by mouth daily. 10/04/19 01/02/20  Little Ishikawa, MD  fluconazole (DIFLUCAN) 150 MG tablet Take 1 tablet (150 mg total) by mouth daily. Take second dose 72 hours later if symptoms still persists. 02/20/20   Cathie Hoops, Ananda Sitzer V, PA-C  fluticasone (FLONASE) 50 MCG/ACT nasal spray Place 2 sprays into both nostrils daily as needed for allergies or rhinitis.    [provider]  glucose blood (TRUE METRIX BLOOD GLUCOSE TEST) test strip Use as instructed 01/13/18   Marcine Matar, MD  Insulin Pen Needle (B-D UF III MINI PEN NEEDLES) 31G X 5 MM MISC Use as instructed. Inject into the skin three times daily 05/16/19   Claiborne Rigg, NP  Melatonin 10 MG CAPS Take 10 mg by mouth at bedtime as needed (sleep).    [provider]  nystatin ointment (MYCOSTATIN) Apply 1 application topically 2 (two) times daily. 11/22/19   Kerin Perna, MD  ondansetron (ZOFRAN ODT) 4 MG disintegrating tablet Take 1 tablet (4 mg total) by mouth every 8 (eight) hours as needed for nausea or vomiting. 02/20/20   Belinda Fisher, PA-C  TRUEPLUS SAFETY LANCETS 28G MISC Use as directed 01/13/18   Marcine Matar, MD    Family History Family History  Problem Relation Age of Onset  . Aneurysm  Mother   . Pancreatic cancer Mother   . Heart attack Father   . Stroke Father   . Breast cancer Paternal Grandmother     Social History Social History   Tobacco Use  . Smoking status: Former Smoker    Packs/day: 0.00    Years: 24.00    Pack years: 0.00    Types: E-cigarettes  . Smokeless tobacco: Current User  . Tobacco comment: vape  Vaping Use  . Vaping Use: Every day  . Devices: 1 pod last 3-4 days  Substance Use Topics  . Alcohol use: Yes    Comment: rare  . Drug use: No     Allergies   Sulfa antibiotics and Codeine   Review of Systems Review of Systems  Reason unable to perform ROS: See HPI as above.     Physical Exam Triage Vital Signs ED Triage Vitals  Enc Vitals Group     BP 02/20/20 1616 (!) 84/64     Pulse Rate 02/20/20 1616 88     Resp 02/20/20 1616 16     Temp 02/20/20 1616 98.1 F (36.7 C)     Temp Source 02/20/20 1616 Oral     SpO2 02/20/20 1616 98 %     Weight --      Height --      Head Circumference --      Peak Flow --      Pain Score 02/20/20 1621 5     Pain Loc --      Pain Edu? --      Excl. in GC? --    Orthostatic VS for the past 24 hrs:  BP- Lying Pulse- Lying BP- Sitting Pulse- Sitting BP- Standing at 0 minutes Pulse- Standing at 0 minutes  02/20/20 1627 100/78 87 103/71 85 (!) 89/65 89    Updated Vital Signs BP 116/76 (BP Location: Left Arm)   Pulse 87   Temp 98.1 F (36.7 C) (Oral)   Resp 16   LMP 10/03/2011   SpO2 98%   Physical Exam Constitutional:  General: She is not in acute distress.    Appearance: Normal appearance. She is not ill-appearing, toxic-appearing or diaphoretic.  HENT:     Head: Normocephalic and atraumatic.     Nose:     Right Sinus: Maxillary sinus tenderness and frontal sinus tenderness present.     Left Sinus: Maxillary sinus tenderness and frontal sinus tenderness present.     Mouth/Throat:     Mouth: Mucous membranes are dry.     Pharynx: Oropharynx is clear. Uvula midline.    Cardiovascular:     Rate and Rhythm: Normal rate and regular rhythm.     Heart sounds: Normal heart sounds. No murmur heard.  No friction rub. No gallop.   Pulmonary:     Effort: Pulmonary effort is normal. No accessory muscle usage, prolonged expiration, respiratory distress or retractions.     Comments: Lungs clear to auscultation without adventitious lung sounds. Musculoskeletal:     Cervical back: Normal range of motion and neck supple.     Comments: No leg swelling.  Neurological:     General: No focal deficit present.     Mental Status: She is alert and oriented to person, place, and time.     UC Treatments / Results  Labs (all labs ordered are listed, but only abnormal results are displayed) Labs Reviewed  POCT FASTING CBG [facility] MANUAL ENTRY - Abnormal; Notable for the following components:      Result Value   POCT Glucose ([facility]) 131 (*)    All other components within normal limits  NOVEL CORONAVIRUS, NAA  CBC WITH DIFFERENTIAL/PLATELET  COMPREHENSIVE METABOLIC PANEL    EKG   Radiology DG Chest 2 View  Result Date: 02/20/2020 CLINICAL DATA:  Shortness of breath and weakness. EXAM: CHEST - 2 VIEW COMPARISON:  11/15/2019 FINDINGS: The lungs are clear without focal pneumonia, edema, pneumothorax or pleural effusion. The cardiopericardial silhouette is within normal limits for size. Status post CABG. The visualized bony structures of the thorax are intact. IMPRESSION: No active cardiopulmonary disease. Electronically Signed   By: Misty Stanley M.D.   On: 02/20/2020 17:23    Procedures Procedures (including critical care time)  Medications Ordered in UC Medications  sodium chloride 0.9 % bolus 1,000 mL (1,000 mLs Intravenous New Bag/Given 02/20/20 1711)    Initial Impression / Assessment and Plan / UC Course  I have reviewed the triage vital signs and the nursing notes.  Pertinent labs & imaging results that were available during my care of the patient were reviewed  by me and considered in my medical decision making (see chart for details).    49 year old female with history of uncontrolled DM, CAD s/p CABG x 4 10/24/2019, HTN, HLD comes in for 3-day history of URI symptoms.  Has had shortness of breath, dyspnea on exertion.  Decreased appetite with nausea without vomiting.  Denies chest pain, orthopnea, leg swelling.  Denies abdominal pain, diarrhea, fever.  At triage, patient was hypotensive at 84/64, afebrile without tachycardia, tachypnea, hypoxia.  She appears fatigued, nontoxic, no acute distress. RRR, LCTAB.  Mucosa membrane dry.  No leg swelling.  CBG 131 with 2 sips of orange juice prior to arrival.  Discussed for risk for hypotension causing symptoms, patient would like to avoid ED visit.  Currently stable, will obtain chest x-ray, CBC, CMP for further evaluation and start IV fluids.  Will reevaluate after IV fluids.  Discussed if symptoms not improving, may still need ED transfer.  CXR negative for active cardiopulmonary  disease. Patient with improvement of some symptoms after IV fluids. BP improved. Will send for COVID testing and monitor symptoms closely. Symptomatic treatment provided. Strict return precautions given. Patient expresses understanding and agrees to plan. Patient discharged in stable condition pending lab results.  At discharge, patient requested diflucan for possible yeast. Had vaginal itching that started today. No vaginal discharge. Gets these frequently. Will call in diflucan.  Final Clinical Impressions(s) / UC Diagnoses   Final diagnoses:  Shortness of breath  Fatigue, unspecified type  Nausea without vomiting    ED Prescriptions    Medication Sig Dispense Auth. Provider   albuterol (VENTOLIN HFA) 108 (90 Base) MCG/ACT inhaler Inhale 1-2 puffs into the lungs every 6 (six) hours as needed for wheezing or shortness of breath. 18 g Bejamin Hackbart V, PA-C   azelastine (ASTELIN) 0.1 % nasal spray Place 2 sprays into both nostrils 2  (two) times daily. 30 mL Mackinze Criado V, PA-C   benzonatate (TESSALON) 200 MG capsule Take 1 capsule (200 mg total) by mouth every 8 (eight) hours. 21 capsule Norma Montemurro V, PA-C   ondansetron (ZOFRAN ODT) 4 MG disintegrating tablet Take 1 tablet (4 mg total) by mouth every 8 (eight) hours as needed for nausea or vomiting. 20 tablet Amyri Frenz V, PA-C   fluconazole (DIFLUCAN) 150 MG tablet Take 1 tablet (150 mg total) by mouth daily. Take second dose 72 hours later if symptoms still persists. 2 tablet Belinda Fisher, PA-C     PDMP not reviewed this encounter.   Belinda Fisher, PA-C 02/20/20 1755

## 2020-02-20 NOTE — Progress Notes (Signed)
E-Visit for Corona Virus Screening  Your current symptoms could be consistent with the coronavirus.  Many health care providers can now test patients at their office but not all are.  [facility] has multiple testing sites. For information on our COVID testing locations and hours go to [email or web address]  We are enrolling you in our MyChart Home Monitoring for COVID19 . Daily you will receive a questionnaire within the MyChart website. Our COVID 19 response team will be monitoring your responses daily.  Testing Information: The COVID-19 Community Testing sites will begin testing BY APPOINTMENT ONLY.  You can schedule online at [email or web address]  If you do not have access to a smart phone or computer you may call [phone] for an appointment.   Additional testing sites in the Community:  . For [facility] Testing sites in [facility]  Farmer[place].com.au  . For Pop-up testing sites in [place]  [email or web address]-[email or web address]  . For Testing sites with regular hours [email or web address]  . For [facility] [email or web address]  . For [facility] [email or web address].dk  . For [facility] testing in [place] and [employer] [email or web address].dk  . For [facility] testing in [facility]   [email or web address]  For  more information about community testing call [phone]   Please quarantine yourself while awaiting your test results. Please stay home for a minimum of 10 days from the first day of illness with improving symptoms and you have had 24 hours of no fever (without the use of Tylenol (Acetaminophen)  Motrin (Ibuprofen) or any fever reducing medication).  Also - Do not get tested prior to returning to work because once you have had a positive test the test can stay positive for more then a month in some cases.   You may take Tylenol cold and sinus over the counter and Delsym if you need help with cough.  You should wear a mask or cloth face covering over your nose and mouth if you must be around other people or animals, including pets (even at home). Try to stay at least 6 feet away from other people. This will protect the people around you.  Please continue good preventive care measures, including:  frequent hand-washing, avoid touching your face, cover coughs/sneezes, stay out of crowds and keep a 6 foot distance from others.  COVID-19 is a respiratory illness with symptoms that are similar to the flu. Symptoms are typically mild to moderate, but there have been cases of severe illness and death due to the virus.   The following symptoms may appear 2-14 days after exposure: . Fever . Cough . Shortness of breath or difficulty breathing . Chills . Repeated shaking with chills . Muscle pain . Headache . Sore throat . New loss of taste or smell . Fatigue . Congestion or runny nose . Nausea or vomiting . Diarrhea  Go to the nearest hospital ED for assessment if fever/cough/breathlessness are severe or illness seems like a threat to life.  It is vitally important that if you feel that you have an infection such as this virus or any other virus that you stay home and away from places where you may spread it to others.  You should avoid contact with people age 24 and older.    You may also take acetaminophen (Tylenol) as needed for fever.  Reduce your risk of any infection by using the same precautions used for avoiding the common cold or flu:  Marland Kitchen Wash your hands often with soap and warm water for at  least 20 seconds.  If soap and water are not readily available, use an alcohol-based hand  sanitizer with at least 60% alcohol.  . If coughing or sneezing, cover your mouth and nose by coughing or sneezing into the elbow areas of your shirt or coat, into a tissue or into your sleeve (not your hands). . Avoid shaking hands with others and consider head nods or verbal greetings only. . Avoid touching your eyes, nose, or mouth with unwashed hands.  . Avoid close contact with people who are sick. . Avoid places or events with large numbers of people in one location, like concerts or sporting events. . Carefully consider travel plans you have or are making. . If you are planning any travel outside or inside the [place], visit the CDC's Travelers' Health webpage for the latest health notices. . If you have some symptoms but not all symptoms, continue to monitor at home and seek medical attention if your symptoms worsen. . If you are having a medical emergency, call 911.  HOME CARE . Only take medications as instructed by your medical team. . Drink plenty of fluids and get plenty of rest. . A steam or ultrasonic humidifier can help if you have congestion.   GET HELP RIGHT AWAY IF YOU HAVE EMERGENCY WARNING SIGNS** FOR COVID-19. If you or someone is showing any of these signs seek emergency medical care immediately. Call 911 or proceed to your closest emergency facility if: . You develop worsening high fever. . Trouble breathing . Bluish lips or face . Persistent pain or pressure in the chest . New confusion . Inability to wake or stay awake . You cough up blood. . Your symptoms become more severe  **This list is not all possible symptoms. Contact your medical provider for any symptoms that are sever or concerning to you.  MAKE SURE YOU   Understand these instructions.  Will watch your condition.  Will get help right away if you are not doing well or get worse.  Your e-visit answers were reviewed by a board certified advanced clinical practitioner to complete your personal care  plan.  Depending on the condition, your plan could have included both over the counter or prescription medications.  If there is a problem please reply once you have received a response from your provider.  Your safety is important to [place].  If you have drug allergies check your prescription carefully.    You can use MyChart to ask questions about today's visit, request a non-urgent call back, or ask for a work or school excuse for 24 hours related to this e-Visit. If it has been greater than 24 hours you will need to follow up with your provider, or enter a new e-Visit to address those concerns. You will get an e-mail in the next two days asking about your experience.  I hope that your e-visit has been valuable and will speed your recovery. Thank you for using e-visits.   Greater than 5 minutes, yet less than 10 minutes of time have been spent researching, coordinating, and implementing care for this patient today.  Thank you for the details you included in the comment boxes. Those details are very helpful in determining the best course of treatment for you and help [place] to provide the best care.

## 2020-02-20 NOTE — Discharge Instructions (Addendum)
Your xray did not show any pneumonia. Blood pressure much better after fluids. COVID PCR testing ordered. I would like you to quarantine until testing results. Albuterol as needed for shortness of breath. Tessalon for cough. Azelastine for sinus pressure. Zofran for nausea/vomiting. Tylenol/motrin for pain and fever. Keep hydrated, urine should be clear to pale yellow in color. If experiencing worsening shortness of breath, trouble breathing, go to the emergency department for further evaluation needed.

## 2020-02-20 NOTE — Discharge Instructions (Addendum)
Follow up in person for evaluation

## 2020-02-20 NOTE — ED Provider Notes (Signed)
Virtual Visit via Video Note:  [name]  initiated request for Telemedicine visit with [facility] team. I connected with [name]  on 02/20/2020 at 3:33 PM  for a synchronized telemedicine visit using a video enabled HIPPA compliant telemedicine application. I verified that I am speaking with [name]  using two identifiers. Amy Doree Fudge, PA-C  was physically located in a [facility] Urgent care site and [name] was located at a different location.   The limitations of evaluation and management by telemedicine as well as the availability of in-person appointments were discussed. Patient was informed that she  may incur a bill ( including co-pay) for this virtual visit encounter. [name]  expressed understanding and gave verbal consent to proceed with virtual visit.   History of Present Illness:[name]  is a 49 y.o. female presents with 3 day history of URI symptoms. Sinus pressure, ear pain, body aches, fatigue. Shortness of breath and DOE. Decreased appetite. Nausea without vomiting. Tmax 100, with subjective fever and chills. Denies chest pain. States felt similar in 09/2019, and was positive for COVID then.  Past Medical History:  Diagnosis Date  . Anxiety   . Arthritis    fingers  . Cervical dysplasia   . Coronary artery disease   . Depression   . Diabetes mellitus    Type II  . Dyspnea 11/22/2013  . GERD (gastroesophageal reflux disease)   . High cholesterol   . History of kidney stones    passed  . Hypertension    no meds now  . Kidney stones   . Moderate episode of recurrent major depressive disorder (HCC) 12/28/2019  . Neuropathy    feet and legs  . Nuclear sclerotic cataract of both eyes 09/2019   Dr. Shea Evans  . PONV (postoperative nausea and vomiting)   . Sleep apnea    lost 70lbs no cpap x43yrs now    Allergies  Allergen Reactions  . Sulfa Antibiotics Hives  . Codeine Itching       Observations/Objective: General: Fatigued, nontoxic, no acute distress. Sitting comfortably. Head: Normocephalic, atraumatic Eye: No conjunctival injection, eyelid swelling. EOMI ENT: Mucus membranes moist, no lip cracking. No obvious nasal drainage. Pulm: Speaking in full sentences without difficulty. Normal effort. No respiratory distress, accessory muscle use. Neuro: Normal mental status. Alert and oriented x 3.  Assessment and Plan: Currently speaking in full sentences without accessory muscle use/respiratory distress, does not need EMS transport to ED. However, given shortness of breath/DOE, patient to follow up in office for evaluation/vitals.  Follow Up Instructions:    I discussed the assessment and treatment plan with the patient. The patient was provided an opportunity to ask questions and all were answered. The patient agreed with the plan and demonstrated an understanding of the instructions.   The patient was advised to call back or seek an in-person evaluation if the symptoms worsen or if the condition fails to improve as anticipated.  I provided 12 minutes of non-face-to-face time during this encounter.    Belinda Fisher, PA-C  02/20/2020 3:33 PM         Belinda Fisher, PA-C 02/20/20 1548

## 2020-02-21 ENCOUNTER — Encounter: Payer: Self-pay | Admitting: Internal Medicine

## 2020-02-21 DIAGNOSIS — IMO0002 Reserved for concepts with insufficient information to code with codable children: Secondary | ICD-10-CM

## 2020-02-21 DIAGNOSIS — E1165 Type 2 diabetes mellitus with hyperglycemia: Secondary | ICD-10-CM

## 2020-02-21 DIAGNOSIS — E1140 Type 2 diabetes mellitus with diabetic neuropathy, unspecified: Secondary | ICD-10-CM

## 2020-02-21 MED ORDER — METFORMIN HCL ER 500 MG PO TB24: 1000.0000 mg | ORAL_TABLET | Freq: Every day | ORAL | 6 refills | Status: DC

## 2020-02-22 ENCOUNTER — Ambulatory Visit: Payer: 59

## 2020-02-22 ENCOUNTER — Telehealth: Payer: Self-pay

## 2020-02-22 ENCOUNTER — Telehealth: Payer: Self-pay | Admitting: Cardiology

## 2020-02-22 NOTE — Telephone Encounter (Signed)
Care guide scheduled patient for 02/28/2020

## 2020-02-22 NOTE — Telephone Encounter (Signed)
Patient had appointment with Care Guide. Routed to her and scheduling to set up new appointment

## 2020-02-22 NOTE — Telephone Encounter (Signed)
Patient calling stating she has a sinus infection and would like to reschedule her social work appointment with the dietitian.

## 2020-02-22 NOTE — Telephone Encounter (Signed)
Patient called to cancel appt today and requested to reschedule with Care Guide/Health Coach. Called patient to reschedule appt from today, 02/22/20 to 02/28/20.

## 2020-02-22 NOTE — Telephone Encounter (Signed)
I will call the patient to reschedule her appt.

## 2020-02-23 LAB — NOVEL CORONAVIRUS, NAA: SARS-CoV-2, NAA: NOT DETECTED

## 2020-02-28 ENCOUNTER — Ambulatory Visit: Payer: 59

## 2020-03-06 ENCOUNTER — Other Ambulatory Visit: Payer: Self-pay | Admitting: Cardiothoracic Surgery

## 2020-03-06 ENCOUNTER — Telehealth: Payer: Self-pay

## 2020-03-06 ENCOUNTER — Encounter: Payer: Self-pay | Admitting: Internal Medicine

## 2020-03-06 DIAGNOSIS — Z951 Presence of aortocoronary bypass graft: Secondary | ICD-10-CM

## 2020-03-06 NOTE — Telephone Encounter (Signed)
Pt calls office to report that she is having pain/soreness in L axillary region, across her left chest, and in lower sternum after falling and cracking two ribs last week. She is s/p CABG by Dr. 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 She denies acute chest pain and shortness of breath. She states x-rays were obtained at [facility] in [place] following her fall. Dr. Donata Clay made aware of situation and wants pt to have a chest CT and f/u visit with him as soon as possible. Pt and office staff notified.

## 2020-03-06 NOTE — Telephone Encounter (Signed)
Copied from CRM [id number]. Topic: Appointment Scheduling - Scheduling Inquiry for Clinic >> Mar 05, 2020 10:46 AM Dalphine Handing A wrote: Patient is requesting an appointment as soon as possible, Patient couldn't not wait until 7/22 for follow up from a recent fall she had and went to the er. Please advise

## 2020-03-07 ENCOUNTER — Other Ambulatory Visit: Payer: Self-pay | Admitting: Internal Medicine

## 2020-03-07 DIAGNOSIS — E1142 Type 2 diabetes mellitus with diabetic polyneuropathy: Secondary | ICD-10-CM

## 2020-03-07 NOTE — Telephone Encounter (Signed)
Requested Prescriptions  Pending Prescriptions Disp Refills  . VICTOZA 18 MG/3ML SOPN [Pharmacy Med Name: VICTOZA 18MG /3ML INJ PEN 3ML(3PACK)] 9 mL 2    Sig: INJECT 0.3 MLS(1.8 MG) INTO THE SKIN DAILY     Endocrinology:  Diabetes - GLP-1 Receptor Agonists Failed - 03/07/2020 11:22 AM      Failed - HBA1C is between 0 and 7.9 and within 180 days    HbA1c, POC (controlled diabetic range)  Date Value Ref Range Status  09/29/2018 8.2 (A) 0.0 - 7.0 % Final   Hgb A1c MFr Bld  Date Value Ref Range Status  10/20/2019 15.1 (H) 4.8 - 5.6 % Final    Comment:    (NOTE) Pre diabetes:          5.7%-6.4% Diabetes:              >6.4% Glycemic control for   <7.0% adults with diabetes          Passed - Valid encounter within last 6 months    Recent Outpatient Visits          2 months ago Uncontrolled type 2 diabetes mellitus with diabetic neuropathy, with long-term current use of insulin (HCC)   [facility] [place], [place] REMO B, MD   4 months ago Uncontrolled type 2 diabetes mellitus with hyperosmolar nonketotic hyperglycemia (HCC)   [place] [facility] [place], Amy J, NP   6 months ago Viral upper respiratory tract infection   [place] [facility] [place], [place], [place]   7 months ago Uncontrolled type 2 diabetes mellitus with diabetic neuropathy, with long-term current use of insulin [facility])   [place] [facility] [place], [place] B, MD   9 months ago Uncontrolled type 2 diabetes mellitus with diabetic neuropathy, with long-term current use of insulin [facility])   [place] [facility], MD      Future Appointments            In 1 month Marcine Matar Laural [place]es, MD [facility]   In 5 months [facility], MD [facility] [place], [facility]

## 2020-03-11 ENCOUNTER — Other Ambulatory Visit: Payer: Self-pay

## 2020-03-11 ENCOUNTER — Ambulatory Visit
Admission: RE | Admit: 2020-03-11 | Discharge: 2020-03-11 | Disposition: A | Discharge status: Home / Self Care | Payer: 59 | Source: Ambulatory Visit | Attending: Cardiothoracic Surgery | Admitting: Cardiothoracic Surgery

## 2020-03-11 DIAGNOSIS — Z951 Presence of aortocoronary bypass graft: Secondary | ICD-10-CM

## 2020-03-13 ENCOUNTER — Encounter: Payer: Self-pay | Admitting: Cardiothoracic Surgery

## 2020-03-13 ENCOUNTER — Ambulatory Visit: Payer: 59 | Admitting: Cardiothoracic Surgery

## 2020-03-13 ENCOUNTER — Other Ambulatory Visit: Payer: Self-pay

## 2020-03-13 ENCOUNTER — Ambulatory Visit: Payer: 59

## 2020-03-13 VITALS — BP 95/66 | HR 77 | Temp 97.9°F | Resp 20 | Ht 68.0 in | Wt 230.0 lb

## 2020-03-13 DIAGNOSIS — S2249XA Multiple fractures of ribs, unspecified side, initial encounter for closed fracture: Secondary | ICD-10-CM

## 2020-03-13 DIAGNOSIS — S2232XD Fracture of one rib, left side, subsequent encounter for fracture with routine healing: Secondary | ICD-10-CM

## 2020-03-13 DIAGNOSIS — S2232XA Fracture of one rib, left side, initial encounter for closed fracture: Secondary | ICD-10-CM | POA: Insufficient documentation

## 2020-03-13 NOTE — Progress Notes (Signed)
PCP is Marcine Matar, MD Referring Provider is Little Ishikawa*  Chief Complaint  Patient presents with  . Rib Fracture    f/u with Chest CT     HPI: Patient returns for follow-up 4 months after multi vessel CABG.  She recently sustained a fall from tripping on a street curb hitting her left arm chest and knee on the asphalt.  She has had persistent left chest pain and tenderness radiating under the left breast line.  CT scan of the chest without contrast demonstrates CT scan of the chest performed yesterday demonstrates subtle hairline fracture in the left fifth and sixth ribs.  No pleural effusion.  Sternal wires are intact and well aligned.  The sternal bone has not completely fused yet in some areas.  On exam she has tenderness over the left fifth rib distribution but no instability noted.  The sternal incision is well-healed and there is no instability. Heart sounds normal.  Past Medical History:  Diagnosis Date  . Anxiety   . Arthritis    fingers  . Cervical dysplasia   . Coronary artery disease   . Depression   . Diabetes mellitus    Type II  . Dyspnea 11/22/2013  . GERD (gastroesophageal reflux disease)   . High cholesterol   . History of kidney stones    passed  . Hypertension    no meds now  . Kidney stones   . Moderate episode of recurrent major depressive disorder (HCC) 12/28/2019  . Neuropathy    feet and legs  . Nuclear sclerotic cataract of both eyes 09/2019   Dr. Shea Evans  . PONV (postoperative nausea and vomiting)   . Sleep apnea    lost 70lbs no cpap x14yrs now    Past Surgical History:  Procedure Laterality Date  . CARDIAC CATHETERIZATION     4-21yrs ago  . CERVICAL CONIZATION W/BX N/A 05/04/2018   Procedure: CONIZATION CERVIX WITH BIOPSY - COLD KNIFE;  Surgeon: Hermina Staggers, MD;  Location: [facility];  Service: Gynecology;  Laterality: N/A;  . CHOLECYSTECTOMY    . CORONARY ARTERY BYPASS GRAFT N/A 10/24/2019   Procedure:  CORONARY ARTERY BYPASS GRAFTING (CABG) times four, using left radial artery harvested endoscopically and right greater saphenous vein harvested endoscopically.;  Surgeon: Kerin Perna, MD;  Location: [facility] OR;  Service: Open Heart Surgery;  Laterality: N/A;  . ENDOMETRIAL ABLATION     6 years ago  . INCISION AND DRAINAGE     for boil- buttocks  . INCISION AND DRAINAGE Left    Leg, Nephrotoxin fasciotimy  . IRRIGATION AND DEBRIDEMENT ABSCESS Right 03/17/2017   Procedure: IRRIGATION AND DEBRIDEMENT RIGHT THIGH ABSCESS;  Surgeon: Karie Soda, MD;  Location: [facility] ORS;  Service: General;  Laterality: Right;  . LAPAROSCOPIC APPENDECTOMY  10/04/2011   Procedure: APPENDECTOMY LAPAROSCOPIC;  Surgeon: Rulon Abide, DO;  Location: [facility] ORS;  Service: General;  Laterality: N/A;  . LEFT HEART CATH AND CORONARY ANGIOGRAPHY N/A 10/06/2019   Procedure: LEFT HEART CATH AND CORONARY ANGIOGRAPHY;  Surgeon: [place], Khylon Davies M, MD;  Location: [facility] INVASIVE CV LAB;  Service: Cardiovascular;  Laterality: N/A;  . RADIAL ARTERY HARVEST Left 10/24/2019   Procedure: Radial Artery Harvest;  Surgeon: Kerin Perna, MD;  Location: [facility] OR;  Service: Open Heart Surgery;  Laterality: Left;  . TEE WITHOUT CARDIOVERSION N/A 10/24/2019   Procedure: TRANSESOPHAGEAL ECHOCARDIOGRAM (TEE);  Surgeon: Donata Clay, Theron Arista, MD;  Location: [facility] OR;  Service: Open Heart Surgery;  Laterality: N/A;    Family History  Problem Relation Age of Onset  . Aneurysm Mother   . Pancreatic cancer Mother   . Heart attack Father   . Stroke Father   . Breast cancer Paternal Grandmother     Social History Social History   Tobacco Use  . Smoking status: Former Smoker    Packs/day: 0.00    Years: 24.00    Pack years: 0.00    Types: E-cigarettes  . Smokeless tobacco: Current User  . Tobacco comment: vape  Vaping Use  . Vaping Use: Every day  . Devices: 1 pod last 3-4 days  Substance Use Topics  . Alcohol use: Yes    Comment: rare  . Drug use: No     Current Outpatient Medications  Medication Sig Dispense Refill  . acetaminophen (TYLENOL) 500 MG tablet Take 1,000 mg by mouth every 6 (six) hours as needed for moderate pain or headache.    . albuterol (VENTOLIN HFA) 108 (90 Base) MCG/ACT inhaler Inhale 1-2 puffs into the lungs every 6 (six) hours as needed for wheezing or shortness of breath. 18 g 0  . aspirin 325 MG EC tablet Take 1 tablet (325 mg total) by mouth daily.    Marland Kitchen atorvastatin (LIPITOR) 80 MG tablet Take 1 tablet (80 mg total) by mouth daily at 6 PM. 90 tablet 0  . azelastine (ASTELIN) 0.1 % nasal spray Place 2 sprays into both nostrils 2 (two) times daily. 30 mL 0  . benzonatate (TESSALON) 200 MG capsule Take 1 capsule (200 mg total) by mouth every 8 (eight) hours. 21 capsule 0  . empagliflozin (JARDIANCE) 10 MG TABS tablet Take 10 mg by mouth daily before breakfast. 30 tablet 3  . esomeprazole (NEXIUM) 20 MG capsule Take 20 mg by mouth daily.     . fluconazole (DIFLUCAN) 150 MG tablet Take 1 tablet (150 mg total) by mouth daily. Take second dose 72 hours later if symptoms still persists. 2 tablet 0  . FLUoxetine (PROZAC) 40 MG capsule Take 1 capsule (40 mg total) by mouth daily. 30 capsule 2  . fluticasone (FLONASE) 50 MCG/ACT nasal spray Place 2 sprays into both nostrils daily as needed for allergies or rhinitis.    Marland Kitchen gabapentin (NEURONTIN) 300 MG capsule 3 caps PO  in the morning, 1 in the afternoon and 3 capsules at night 210 capsule 6  . glucose blood (TRUE METRIX BLOOD GLUCOSE TEST) test strip Use as instructed 100 each 12  . insulin detemir (LEVEMIR FLEXTOUCH) 100 UNIT/ML FlexPen Inject 37 Units into the skin 2 (two) times daily. 15 mL 11  . Insulin Pen Needle (B-D UF III MINI PEN NEEDLES) 31G X 5 MM MISC Use as instructed. Inject into the skin three times daily 100 each 3  . Melatonin 10 MG CAPS Take 10 mg by mouth at bedtime as needed (sleep).    . metFORMIN (GLUCOPHAGE-XR) 500 MG 24 hr tablet Take 2 tablets (1,000 mg  total) by mouth daily with breakfast. 60 tablet 6  . metoprolol succinate (TOPROL XL) 25 MG 24 hr tablet Take 0.5 tablets (12.5 mg total) by mouth daily. 45 tablet 3  . nystatin ointment (MYCOSTATIN) Apply 1 application topically 2 (two) times daily. 30 g 0  . ondansetron (ZOFRAN ODT) 4 MG disintegrating tablet Take 1 tablet (4 mg total) by mouth every 8 (eight) hours as needed for nausea or vomiting. 20 tablet 0  . TRUEPLUS SAFETY LANCETS 28G MISC Use as directed 100  each 3  . VICTOZA 18 MG/3ML SOPN INJECT 0.3 MLS(1.8 MG) INTO THE SKIN DAILY 9 mL 2  . ezetimibe (ZETIA) 10 MG tablet Take 1 tablet (10 mg total) by mouth daily. 90 tablet 3   No current facility-administered medications for this visit.    Allergies  Allergen Reactions  . Sulfa Antibiotics Hives  . Codeine Itching    Review of Systems  No symptoms of angina Bruised on the left knee from fall Some swelling in the right foot from fall in R foot from fall  BP 95/66   Pulse 77   Temp 97.9 F (36.6 C) (Skin)   Resp 20   Ht 5\' 8"  (1.727 m)   Wt 230 lb (104.3 kg)   LMP 10/03/2011   SpO2 97% Comment: RA  BMI 34.97 kg/m  Physical Exam      Exam    General- alert and comfortable    Neck- no JVD, no cervical adenopathy palpable, no carotid bruit   Lungs- clear without rales, wheezes   Cor- regular rate and rhythm, no murmur , gallop   Abdomen- soft, non-tender   Extremities - warm, non-tender, minimal edema   Neuro- oriented, appropriate, no focal weakness   Diagnostic Tests: CT images personally reviewed and discussed with patient showing results as noted above  Impression: History of multivessel CABG 4 months ago now with recent fall on left chest with subtle rib fractures noted on CT scan left fifth and sixth ribs  Plan: Patient will limit lifting to 10 pounds maximum.  She will take Tylenol as needed for the pain and use incentive spirometry to help maintain good lung volume with rib fractures  Return in 4  weeks with chest x-ray   12/01/2011, MD [facility] [phone]

## 2020-03-14 ENCOUNTER — Telehealth: Payer: Self-pay

## 2020-03-14 DIAGNOSIS — Z0000 Encounter for general adult medical examination without abnormal findings: Secondary | ICD-10-CM

## 2020-03-14 NOTE — Telephone Encounter (Signed)
Called patient to reschedule appointment due to another appointment on the same day from sustained injuries. New appointment is scheduled for 7/20 at 11:30am.

## 2020-03-19 ENCOUNTER — Ambulatory Visit: Payer: 59

## 2020-03-19 ENCOUNTER — Telehealth: Payer: Self-pay

## 2020-03-19 ENCOUNTER — Other Ambulatory Visit: Payer: Self-pay | Admitting: Internal Medicine

## 2020-03-19 NOTE — Telephone Encounter (Signed)
Called patient to reschedule appointment - Providers request °

## 2020-03-20 ENCOUNTER — Other Ambulatory Visit: Payer: Self-pay

## 2020-03-20 ENCOUNTER — Ambulatory Visit (INDEPENDENT_AMBULATORY_CARE_PROVIDER_SITE_OTHER): Payer: 59

## 2020-03-20 DIAGNOSIS — Z0000 Encounter for general adult medical examination without abnormal findings: Secondary | ICD-10-CM

## 2020-03-20 DIAGNOSIS — F439 Reaction to severe stress, unspecified: Secondary | ICD-10-CM

## 2020-03-20 NOTE — Progress Notes (Signed)
Appointment Outcome:  Completed Session #: Initial Health Coaching Session   AGREEMENTS SECTION    Overall Goal(s): Patient would like to lose 30-40 pounds, but have not set a specific time frame to safely lose this amount of weight. Patient wants to manage her HBP, diabetes and LDL with a healthy diet. Patient is interested in counseling for stress.                                          Agreement/Action Steps:  Patient will read food labels to reduce the amount of cholesterol and sodium she intakes daily. Patient will make changes in her diet to incorporate healthier choices (butter substitute and sea salt).  Patient will attempt to meal prep to avoid stress eating. Patient will set aside some personal time for self to organize around her health needs.   Progress Notes:  Patient has been successful in incorporating health lifestyle changes in the past to lose weight. Patient will draw on those experiences to help her make healthy changes in her diet to lose 30-40 pounds and to improve her LDL, HBP, and diabetes.    Indicators of Success and Accountability:  Patient has not identified an accountability plan outside of the Care Guide. . Readiness: Patient is in preparation to address stress through counseling. Patient is in preparation to try to implement healthy food choices into her diet. . Strengths and Supports: Patient will draw from her success that she had when implementing healthy food choices and behaviors when pregnant with son and when she lost weight for her class reunion. Patient has not identified supports yet. . Challenges and Barriers: Patient will have difficulty implementing meal prepping because she has to care for son throughout the day and time constraints. Patient identified that she use food as a coping mechanism for stress.  Coaching Outcomes: Care Guide will help identify healthy food options for patient to help manage HBP, diabetes and cholesterol. Patient  will be referred for counseling.    Referrals: [facility] for counseling

## 2020-03-27 ENCOUNTER — Encounter: Payer: Self-pay | Admitting: Cardiothoracic Surgery

## 2020-03-27 LAB — HM DIABETES EYE EXAM

## 2020-04-02 ENCOUNTER — Other Ambulatory Visit: Payer: Self-pay | Admitting: Internal Medicine

## 2020-04-03 ENCOUNTER — Ambulatory Visit: Payer: 59

## 2020-04-05 ENCOUNTER — Telehealth: Payer: Self-pay

## 2020-04-05 DIAGNOSIS — Z0000 Encounter for general adult medical examination without abnormal findings: Secondary | ICD-10-CM

## 2020-04-05 NOTE — Telephone Encounter (Signed)
Called patient to determine if they wanted to rescheduled cancelled appointment on 04/03/20 for health coaching session. Patient stated they wanted another appointment. Care Guide/Health Coach schedule an appt with patient on 04/17/20 for health coaching session per patient's request.

## 2020-04-08 NOTE — Progress Notes (Signed)
Office Visit Note  Patient: [name]             Date of Birth: 07-May-1971           MRN: [id number]             PCP: Ladell Pier, MD Referring: Ladell Pier, MD Visit Date: 04/22/2020 Occupation: _0 @  Subjective:  New Patient (Initial Visit) (Bil hand pain, bil knee pain, bil shoulder pain, bil wrist pain, low back pain )   History of Present Illness: [name] is a 49 y.o. female seen in consultation per request of her PCP. According the patient she was very active as a child and had many injuries in those areas are still painful. She states over the last many years she has had pain and discomfort in her both hands, both knees and her both feet. She also has discomfort in her shoulders and lower back. Her worst areas are her left hand and wrist, left shoulder, her left knee and left foot. She has noticed swelling in her hands and her left foot. She is occasionally noticed swelling in her left knee. There is no history of psoriasis. She states she fell on 29 June and since then she has been active having increased pain in her left shoulder. She fractured 2 ribs and her left second toe. There is no family history of autoimmune disease. Several family members have osteoarthritis. She is gravida 2 para 1 and abortion 1. There is no history of any blood clots. She gives history of plantar fasciitis in the past. There is no history of Achilles tendinitis or iritis.  Activities of Daily Living:  Patient reports morning stiffness for 10 minutes.   Patient Reports nocturnal pain.  Difficulty dressing/grooming: Denies Difficulty climbing stairs: Denies Difficulty getting out of chair: Reports Difficulty using hands for taps, buttons, cutlery, and/or writing: Reports  Review of Systems  Constitutional: Positive for fatigue.  HENT: Positive for mouth dryness.   Eyes: Negative for dryness.  Respiratory: Negative for shortness of breath.   Cardiovascular: Positive  for swelling in legs/feet.  Gastrointestinal: Negative for constipation.  Endocrine: Positive for cold intolerance, heat intolerance, excessive thirst and increased urination.  Genitourinary: Negative for difficulty urinating.  Musculoskeletal: Positive for arthralgias, gait problem, joint pain, joint swelling, muscle weakness, morning stiffness and muscle tenderness.  Skin: Positive for rash.  Allergic/Immunologic: Positive for susceptible to infections.  Neurological: Positive for numbness and weakness.  Hematological: Positive for bruising/bleeding tendency.  Psychiatric/Behavioral: Positive for sleep disturbance.    PMFS History:  Patient Active Problem List   Diagnosis Date Noted  . Midline sternotomy scar 04/17/2020  . Left rib fracture 03/13/2020  . Moderate episode of recurrent major depressive disorder ([place]) 12/28/2019  . Encounter for post surgical wound check 12/06/2019  . Type 2 diabetes mellitus with diabetic polyneuropathy, with long-term current use of insulin ([place]) 11/16/2019  . Diabetes mellitus ([place]) 11/16/2019  . Type 2 diabetes mellitus with retinopathy, with long-term current use of insulin ([place]) 11/16/2019  . Dyslipidemia 11/16/2019  . Postop check 11/15/2019  . Diabetic retinopathy ([place]) 10/25/2019  . 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 Angina pectoris ([place]) 10/06/2019  . Left-sided chest pain 06/13/2019  . Post-operative state 06/06/2018  . HGSIL (high grade squamous intraepithelial lesion) on Pap smear of cervix 02/25/2018  . Right upper quadrant abdominal pain 12/30/2017  . Polyarthritis 12/30/2017  . OSA (obstructive sleep apnea) 11/22/2013  . Dyspnea 11/22/2013  .  Uncontrolled type 2 diabetes mellitus with hyperosmolar nonketotic hyperglycemia ([place]) 07/21/2012  . HTN (hypertension) 07/21/2012  . Smoker 07/21/2012    Past Medical History:  Diagnosis Date  . Anxiety   . Arthritis    fingers  . Cervical dysplasia   . Coronary artery disease   . Depression    . Diabetes mellitus    Type II  . Dyspnea 11/22/2013  . GERD (gastroesophageal reflux disease)   . High cholesterol   . History of kidney stones    passed  . Hypertension    no meds now  . Kidney stones   . Moderate episode of recurrent major depressive disorder ([place]) 12/28/2019  . Neuropathy    feet and legs  . Nuclear sclerotic cataract of both eyes 09/2019   Dr. Idolina Primer  . PONV (postoperative nausea and vomiting)   . Sleep apnea    lost 70lbs no cpap x48yr now    Family History  Problem Relation Age of Onset  . Aneurysm Mother   . Pancreatic cancer Mother   . Heart attack Father   . Stroke Father   . Heart disease Sister   . Breast cancer Paternal Grandmother   . Congestive Heart Failure Sister   . Osteoarthritis Sister   . Osteoarthritis Sister    Past Surgical History:  Procedure Laterality Date  . CARDIAC CATHETERIZATION     4-519yrago  . CERVICAL CONIZATION W/BX N/A 05/04/2018   Procedure: CONIZATION CERVIX WITH BIOPSY - COLD KNIFE;  Surgeon: ErChancy MilroyMD;  [place] [facility]: Gynecology;  Laterality: N/A;  . CHOLECYSTECTOMY    . CORONARY ARTERY BYPASS GRAFT N/A 10/24/2019   Procedure: CORONARY ARTERY BYPASS GRAFTING (CABG) times four, using left radial artery harvested endoscopically and right greater saphenous vein harvested endoscopically.;  Surgeon: VaIvin PootMD;  [facility] Service: Open Heart Surgery;  Laterality: N/A;  . ENDOMETRIAL ABLATION     6 years ago  . INCISION AND DRAINAGE     for boil- buttocks  . INCISION AND DRAINAGE Left    Leg, Nephrotoxin fasciotimy  . IRRIGATION AND DEBRIDEMENT ABSCESS Right 03/17/2017   Procedure: IRRIGATION AND DEBRIDEMENT RIGHT THIGH ABSCESS;  Surgeon: GrMichael BostonMD;  [facility]: [facility];  Service: General;  Laterality: Right;  . LAPAROSCOPIC APPENDECTOMY  10/04/2011   Procedure: APPENDECTOMY LAPAROSCOPIC;  Surgeon: BrJudieth KeensDO;  Location: [facility];  Service: General;   Laterality: N/A;  . LEFT HEART CATH AND CORONARY ANGIOGRAPHY N/A 10/06/2019   Procedure: LEFT HEART CATH AND CORONARY ANGIOGRAPHY;  Surgeon: JoMartiniquePeter M, MD;  [facility];  Service: Cardiovascular;  Laterality: N/A;  . RADIAL ARTERY HARVEST Left 10/24/2019   Procedure: Radial Artery Harvest;  Surgeon: VaIvin PootMD;  [facility]: Open Heart Surgery;  Laterality: Left;  . TEE WITHOUT CARDIOVERSION N/A 10/24/2019   Procedure: TRANSESOPHAGEAL ECHOCARDIOGRAM (TEE);  Surgeon: VaPrescott GumPeCollier SalinaMD;  [facility]: Open [facility] Surgery;  Laterality: N/A;   Social History   Social History Narrative  . Not on file   Immunization History  Administered Date(s) Administered  . Influenza Split 05/31/2013  . Influenza,inj,Quad PF,6+ Mos 06/05/2019  . Pneumococcal Polysaccharide-23 03/29/2017  . Tdap 01/27/2018     Objective: Vital Signs: BP 100/67 (BP Location: Right Arm, Patient Position: Sitting, Cuff Size: Normal)   Pulse 91   Resp 16   Ht _0  (1.753 m)  Wt 237 lb 3.2 oz (107.6 kg)   LMP 10/03/2011   BMI 35.03 kg/m    Physical Exam Vitals and nursing note reviewed.  Constitutional:      Appearance: She is well-developed.  HENT:     Head: Normocephalic and atraumatic.  Eyes:     Conjunctiva/sclera: Conjunctivae normal.  Cardiovascular:     Rate and Rhythm: Normal rate and regular rhythm.     Heart sounds: Normal heart sounds.  Pulmonary:     Effort: Pulmonary effort is normal.     Breath sounds: Normal breath sounds.  Abdominal:     General: Bowel sounds are normal.     Palpations: Abdomen is soft.  Musculoskeletal:     Cervical back: Normal range of motion.  Lymphadenopathy:     Cervical: No cervical adenopathy.  Skin:    General: Skin is warm and dry.     Capillary Refill: Capillary refill takes less than 2 seconds.  Neurological:     Mental Status: She is alert and oriented to person, place, and time.  Psychiatric:          Behavior: Behavior normal.      Musculoskeletal Exam: She has some discomfort range of motion of her cervical lumbar spine. She had painful range of motion of her left shoulder joint with abduction limited to 110 degrees. Elbow joints and wrist joints with good range of motion. PIP and DIP thickening was noted in bilateral hands with no synovitis. Hip joints and knee joints with good range of motion. No warmth swelling or effusion was noted. She has bilateral PIP and DIP thickening noted in her feet with no synovitis.  CDAI Exam: CDAI Score: -- Patient Global: --; Provider Global: -- Swollen: --; Tender: -- Joint Exam 04/22/2020   No joint exam has been documented for this visit   There is currently no information documented on the homunculus. Go to the Rheumatology activity and complete the homunculus joint exam.  Investigation: No additional findings.  Imaging: DG Chest 2 View  Result Date: 04/17/2020 CLINICAL DATA:  Status post coronary artery bypass grafting. Recent fall with pain EXAM: CHEST - 2 VIEW COMPARISON:  February 20, 2020. FINDINGS: The lungs are clear. The heart size and pulmonary vascularity are normal. Patient is status post coronary artery bypass grafting. No adenopathy. No pneumothorax. No bone lesions evident. IMPRESSION: Status post coronary artery bypass grafting. Heart size normal. Lungs clear. No pneumothorax. Electronically Signed   By: Lowella Grip III M.D.   On: 04/17/2020 13:14    Recent Labs: Lab Results  Component Value Date   WBC 6.9 02/20/2020   HGB 15.8 02/20/2020   PLT 285 02/20/2020   NA 141 02/20/2020   K 4.2 02/20/2020   CL 99 02/20/2020   CO2 24 02/20/2020   GLUCOSE 127 (H) 02/20/2020   BUN 25 (H) 02/20/2020   CREATININE 1.27 (H) 02/20/2020   BILITOT 0.2 02/20/2020   ALKPHOS 126 (H) 02/20/2020   AST 20 02/20/2020   ALT 27 02/20/2020   PROT 7.0 02/20/2020   ALBUMIN 4.4 02/20/2020   CALCIUM 10.2 02/20/2020   GFRAA 57 (L)  02/20/2020    Speciality Comments: No specialty comments available.  Procedures:  No procedures performed Allergies: Sulfa antibiotics and Codeine   Assessment / Plan:     Visit Diagnoses: Polyarthritis-she complains of pain and discomfort in multiple joints for many years.  Chronic left shoulder pain -she has limited painful range of motion of her left shoulder. Plan:  XR Shoulder Left.  X-ray of the shoulder joint was unremarkable.  Pain in both hands -she complains of pain and swelling in her hands. No synovitis was noted. She had bilateral DIP and PIP thickening consistent with osteoarthritis. Plan: XR Hand 2 View Right, XR Hand 2 View Left.  X-ray of bilateral hands showed osteoarthritis.  Postsurgical changes were noted in the left hand.  Subluxation of fifth PIP joint was noted on examination and on the x-ray due to previous injury.  Chronic pain of left knee -she complains of pain and swelling in her left knee. No synovitis was noted. Plan: XR KNEE 3 VIEW LEFT.  X-ray of knee joint was unremarkable.  Pain in both feet -she complains of pain and discomfort in her bilateral feet. No synovitis was noted. DIP and PIP thickening was consistent with osteoarthritis. Plan: XR Foot 2 Views Right, XR Foot 2 Views Left, x-ray of bilateral feet were consistent with osteoarthritis.  Sedimentation rate, Uric acid, Rheumatoid factor, Cyclic citrul peptide antibody, IgG, HLA-B27 antigen  Essential hypertension-blood pressure is within normal limits.  Other medical problems are listed as follows:  S/P CABG x 4  Uncontrolled type 2 diabetes mellitus with hyperosmolar nonketotic hyperglycemia (HCC)  History of gastroesophageal reflux (GERD)  HGSIL (high grade squamous intraepithelial lesion) on Pap smear of cervix  Dyslipidemia  Anxiety and depression  OSA (obstructive sleep apnea)  Nuclear sclerotic cataract of both eyes  History of kidney stones  Former smoker - 2 packs/day for 24  years. She quit smoking in 2018.  Orders: Orders Placed This Encounter  Procedures  . XR Hand 2 View Right  . XR Hand 2 View Left  . XR KNEE 3 VIEW LEFT  . XR Foot 2 Views Right  . XR Foot 2 Views Left  . XR Shoulder Left  . Sedimentation rate  . Uric acid  . Rheumatoid factor  . Cyclic citrul peptide antibody, IgG  . HLA-B27 antigen   No orders of the defined types were placed in this encounter.    Follow-Up Instructions: Return for Pain in multiple joints.   Bo Merino, MD  Note - This record has been created using Editor, commissioning.  Chart creation errors have been sought, but may not always  have been located. Such creation errors do not reflect on  the standard of medical care.,

## 2020-04-10 ENCOUNTER — Other Ambulatory Visit: Payer: Self-pay | Admitting: Internal Medicine

## 2020-04-16 ENCOUNTER — Other Ambulatory Visit: Payer: Self-pay | Admitting: Cardiothoracic Surgery

## 2020-04-16 DIAGNOSIS — Z951 Presence of aortocoronary bypass graft: Secondary | ICD-10-CM

## 2020-04-17 ENCOUNTER — Ambulatory Visit
Admission: RE | Admit: 2020-04-17 | Discharge: 2020-04-17 | Disposition: A | Discharge status: Home / Self Care | Payer: 59 | Source: Ambulatory Visit | Attending: Cardiothoracic Surgery | Admitting: Cardiothoracic Surgery

## 2020-04-17 ENCOUNTER — Encounter: Payer: Self-pay | Admitting: Cardiothoracic Surgery

## 2020-04-17 ENCOUNTER — Ambulatory Visit (INDEPENDENT_AMBULATORY_CARE_PROVIDER_SITE_OTHER): Payer: 59

## 2020-04-17 ENCOUNTER — Other Ambulatory Visit: Payer: Self-pay

## 2020-04-17 ENCOUNTER — Ambulatory Visit: Payer: 59 | Admitting: Cardiothoracic Surgery

## 2020-04-17 DIAGNOSIS — L905 Scar conditions and fibrosis of skin: Secondary | ICD-10-CM | POA: Diagnosis not present

## 2020-04-17 DIAGNOSIS — Z9889 Other specified postprocedural states: Secondary | ICD-10-CM | POA: Insufficient documentation

## 2020-04-17 DIAGNOSIS — Z0000 Encounter for general adult medical examination without abnormal findings: Secondary | ICD-10-CM

## 2020-04-17 DIAGNOSIS — Z951 Presence of aortocoronary bypass graft: Secondary | ICD-10-CM

## 2020-04-17 NOTE — Progress Notes (Signed)
Appointment Outcome:  Completed, Session #: 1  AGREEMENTS SECTION   Overall Goal(s): Patient wants to: Lose 30-40 pounds with 6-8 months (15-20 pounds in 3-4 months). Lower her glucose and BP   Agreement/Action Steps:  Patient stated that they will try meal prepping Reading food labels   Progress Notes:  Patient was not able to meal prep because she has in-house guest that have been cooking to show gratitude during their stay. Patient stated that it made her feel guilty not prepping her meals as well as not following through with her action steps although she had company. She doesn't want to use these as excuses. However, patient has made some small changes to her diet, such as eating only 1/2 of a banana instead of a whole one, and drinking zero sugar Gatorade. Patient stated that she need help with the way that she approaches food - using it for social gatherings, celebrating, stress eating etc.). Patient is concerned that once she loses the weight that she will not be able to keep it off. Patient stated that she does not want to cut out the foods that she loves otherwise it will make her eat more of it when she does. Patient wants a create a balanced diet for herself.  Indicators of Success and Accountability:  Patient is able to meal prep Monday - Thursday and follow through with healthier food choices. Readiness: Patient is in the action phase of the desired behavior change. Strengths and Supports: Patient currently have friends that are supporting her. Patient stated that she has been able to lose weight before. Challenges and Barriers: Spontaneity and convenience are the challenges that the patient face when hanging out with friends or when they are out running errands in relation to eating out.  Coaching Outcomes: Patient stated that they will grocery shop on Sunday and will meal prep for Monday through Thursday to test this routine out. Patient will watch what they eat Friday  through Sunday. Patient stated that she will walk 5xs per week or ride her exercise bike for 30 mins at a time. Patient has been encouraged to enforce positive self talk into her life to boost her confidence level of being able to manage her health and work towards reaching her goal.  Attempted: Partial - Patient has been reading food labels more often, but was not able to meal prep. Patient incorporate other action steps to help move her towards her goal.    Referrals: Follow up with referral to counseling

## 2020-04-17 NOTE — Progress Notes (Signed)
PCP is Marcine Matar, MD Referring Provider is Cora Daniels, *  Chief Complaint  Patient presents with  . Routine Post Op    f/u with CXR s/p CABG 10/24/19, rib fractures     HPI: The patient returns for follow-up of left-sided nondisplaced rib fractures following a fall involving the sixth and seventh ribs.  She is 6 months status post CABG.  Her left lateral rib pain is significantly improved.  Her exercise tolerance has improved.  Sternal tenderness has continued to improve as well.  She still has some residual left ankle swelling which was made worse by her fall and a fractured left great toe and left leg twist injury.  The saphenous vein was harvested from her left leg as well.  I personally reviewed the chest x-ray performed today which shows no evidence of rib abnormality or displacement, no pleural effusion, sternal wires intact and well aligned.  The patient denies any recurrent angina.  She is trying to walk 30 minutes most days. He is compliant with her medications. Past Medical History:  Diagnosis Date  . Anxiety   . Arthritis    fingers  . Cervical dysplasia   . Coronary artery disease   . Depression   . Diabetes mellitus    Type II  . Dyspnea 11/22/2013  . GERD (gastroesophageal reflux disease)   . High cholesterol   . History of kidney stones    passed  . Hypertension    no meds now  . Kidney stones   . Moderate episode of recurrent major depressive disorder (HCC) 12/28/2019  . Neuropathy    feet and legs  . Nuclear sclerotic cataract of both eyes 09/2019   Dr. Shea Evans  . PONV (postoperative nausea and vomiting)   . Sleep apnea    lost 70lbs no cpap x30yrs now    Past Surgical History:  Procedure Laterality Date  . CARDIAC CATHETERIZATION     4-52yrs ago  . CERVICAL CONIZATION W/BX N/A 05/04/2018   Procedure: CONIZATION CERVIX WITH BIOPSY - COLD KNIFE;  Surgeon: Hermina Staggers, MD;  Location: [facility];  Service: Gynecology;   Laterality: N/A;  . CHOLECYSTECTOMY    . CORONARY ARTERY BYPASS GRAFT N/A 10/24/2019   Procedure: CORONARY ARTERY BYPASS GRAFTING (CABG) times four, using left radial artery harvested endoscopically and right greater saphenous vein harvested endoscopically.;  Surgeon: Kerin Perna, MD;  [facility]: [facility] OR;  Service: Open Heart Surgery;  Laterality: N/A;  . ENDOMETRIAL ABLATION     6 years ago  . INCISION AND DRAINAGE     for boil- buttocks  . INCISION AND DRAINAGE Left    Leg, Nephrotoxin fasciotimy  . IRRIGATION AND DEBRIDEMENT ABSCESS Right 03/17/2017   Procedure: IRRIGATION AND DEBRIDEMENT RIGHT THIGH ABSCESS;  Surgeon: Karie Soda, MD;  [facility];  Service: General;  Laterality: Right;  . LAPAROSCOPIC APPENDECTOMY  10/04/2011   Procedure: APPENDECTOMY LAPAROSCOPIC;  Surgeon: Rulon Abide, DO;  [facility];  Service: General;  Laterality: N/A;  . LEFT HEART CATH AND CORONARY ANGIOGRAPHY N/A 10/06/2019   Procedure: LEFT HEART CATH AND CORONARY ANGIOGRAPHY;  Surgeon: [place], Cristen Bredeson M, MD;  Location: [facility] INVASIVE CV LAB;  Service: Cardiovascular;  Laterality: N/A;  . RADIAL ARTERY HARVEST Left 10/24/2019   Procedure: Radial Artery Harvest;  Surgeon: Kerin Perna, MD;  Location: [facility] OR;  Service: Open Heart Surgery;  Laterality: Left;  . TEE WITHOUT CARDIOVERSION N/A 10/24/2019   Procedure: TRANSESOPHAGEAL ECHOCARDIOGRAM (  TEE);  Surgeon: Kerin Perna, MD;  Location: [facility] OR;  Service: Open Heart Surgery;  Laterality: N/A;    Family History  Problem Relation Age of Onset  . Aneurysm Mother   . Pancreatic cancer Mother   . Heart attack Father   . Stroke Father   . Breast cancer Paternal Grandmother     Social History Social History   Tobacco Use  . Smoking status: Former Smoker    Packs/day: 0.00    Years: 24.00    Pack years: 0.00    Types: E-cigarettes  . Smokeless tobacco: Current User  . Tobacco comment: vape  Vaping Use  . Vaping Use: Every day  .  Devices: 1 pod last 3-4 days  Substance Use Topics  . Alcohol use: Yes    Comment: rare  . Drug use: No    Current Outpatient Medications  Medication Sig Dispense Refill  . acetaminophen (TYLENOL) 500 MG tablet Take 1,000 mg by mouth every 6 (six) hours as needed for moderate pain or headache.    . albuterol (VENTOLIN HFA) 108 (90 Base) MCG/ACT inhaler Inhale 1-2 puffs into the lungs every 6 (six) hours as needed for wheezing or shortness of breath. 18 g 0  . aspirin 325 MG EC tablet Take 1 tablet (325 mg total) by mouth daily.    Marland Kitchen atorvastatin (LIPITOR) 80 MG tablet TAKE 1 TABLET(80 MG) BY MOUTH DAILY AT 6 PM 90 tablet 0  . azelastine (ASTELIN) 0.1 % nasal spray Place 2 sprays into both nostrils 2 (two) times daily. 30 mL 0  . esomeprazole (NEXIUM) 20 MG capsule Take 20 mg by mouth daily.     Marland Kitchen FLUoxetine (PROZAC) 40 MG capsule TAKE 1 CAPSULE(40 MG) BY MOUTH DAILY 90 capsule 0  . fluticasone (FLONASE) 50 MCG/ACT nasal spray Place 2 sprays into both nostrils daily as needed for allergies or rhinitis.    Marland Kitchen gabapentin (NEURONTIN) 300 MG capsule 3 caps PO  in the morning, 1 in the afternoon and 3 capsules at night 210 capsule 6  . glucose blood (TRUE METRIX BLOOD GLUCOSE TEST) test strip Use as instructed 100 each 12  . insulin detemir (LEVEMIR FLEXTOUCH) 100 UNIT/ML FlexPen Inject 37 Units into the skin 2 (two) times daily. 15 mL 11  . Insulin Pen Needle (B-D UF III MINI PEN NEEDLES) 31G X 5 MM MISC Use as instructed. Inject into the skin three times daily 100 each 3  . JARDIANCE 10 MG TABS tablet TAKE 1 TABLET BY MOUTH DAILY BEFORE BREAKFAST 30 tablet 3  . Melatonin 10 MG CAPS Take 10 mg by mouth at bedtime as needed (sleep).    . metFORMIN (GLUCOPHAGE-XR) 500 MG 24 hr tablet Take 2 tablets (1,000 mg total) by mouth daily with breakfast. 60 tablet 6  . metoprolol succinate (TOPROL XL) 25 MG 24 hr tablet Take 0.5 tablets (12.5 mg total) by mouth daily. 45 tablet 3  . TRUEPLUS SAFETY LANCETS  28G MISC Use as directed 100 each 3  . VICTOZA 18 MG/3ML SOPN INJECT 0.3 MLS(1.8 MG) INTO THE SKIN DAILY 9 mL 2  . ezetimibe (ZETIA) 10 MG tablet Take 1 tablet (10 mg total) by mouth daily. 90 tablet 3   No current facility-administered medications for this visit.    Allergies  Allergen Reactions  . Sulfa Antibiotics Hives  . Codeine Itching    Review of Systems  Weight stable No fever No symptoms of COVID-19-fever cough increased shortness of breath loss of  taste or smell  BP 109/73 (BP Location: Right Arm, Patient Position: Sitting, Cuff Size: Normal)   Pulse 88   Temp (!) 97 F (36.1 C) (Temporal)   Resp 20   Ht 5\' 9"  (1.753 m)   Wt 239 lb (108.4 kg)   LMP 10/03/2011   SpO2 97% Comment: RA  BMI 35.29 kg/m  Physical Exam      Exam    General- alert and comfortable.  Sternal and leg incisions well-healed.    Neck- no JVD, no cervical adenopathy palpable, no carotid bruit   Lungs- clear without rales, wheezes   Cor- regular rate and rhythm, no murmur , gallop   Abdomen- soft, non-tender   Extremities - warm, non-tender, minimal edema   Neuro- oriented, appropriate, no focal weakness   Diagnostic Tests: Chest x-ray image personally reviewed with results as noted above  Impression: Patient with excellent functional status now over 6 months following surgery.  Her sternal striction's are lifted and she can proceed with normal activities with a gradual progression in load.  Plan: Return as needed.  Continue with heart healthy lifestyle which includes, as she understands, heart healthy diet and compliance with cardiac medications and 30 minutes of ambulation daily.   12/01/2011, MD [facility] [phone]

## 2020-04-18 ENCOUNTER — Other Ambulatory Visit: Payer: Self-pay

## 2020-04-18 ENCOUNTER — Ambulatory Visit: Payer: 59 | Admitting: Rheumatology

## 2020-04-18 MED ORDER — FUROSEMIDE 20 MG PO TABS
20.0000 mg | ORAL_TABLET | ORAL | 1 refills | Status: DC | PRN
Start: 2020-04-18 — End: 2020-08-27

## 2020-04-22 ENCOUNTER — Ambulatory Visit: Payer: Self-pay

## 2020-04-22 ENCOUNTER — Encounter: Payer: Self-pay | Admitting: Rheumatology

## 2020-04-22 ENCOUNTER — Other Ambulatory Visit: Payer: Self-pay

## 2020-04-22 ENCOUNTER — Ambulatory Visit (INDEPENDENT_AMBULATORY_CARE_PROVIDER_SITE_OTHER): Payer: 59 | Admitting: Rheumatology

## 2020-04-22 VITALS — BP 100/67 | HR 91 | Resp 16 | Ht 69.0 in | Wt 237.2 lb

## 2020-04-22 DIAGNOSIS — M79641 Pain in right hand: Secondary | ICD-10-CM | POA: Diagnosis not present

## 2020-04-22 DIAGNOSIS — M25562 Pain in left knee: Secondary | ICD-10-CM | POA: Diagnosis not present

## 2020-04-22 DIAGNOSIS — E1100 Type 2 diabetes mellitus with hyperosmolarity without nonketotic hyperglycemic-hyperosmolar coma (NKHHC): Secondary | ICD-10-CM

## 2020-04-22 DIAGNOSIS — F32A Depression, unspecified: Secondary | ICD-10-CM

## 2020-04-22 DIAGNOSIS — I10 Essential (primary) hypertension: Secondary | ICD-10-CM

## 2020-04-22 DIAGNOSIS — G8929 Other chronic pain: Secondary | ICD-10-CM

## 2020-04-22 DIAGNOSIS — Z87442 Personal history of urinary calculi: Secondary | ICD-10-CM

## 2020-04-22 DIAGNOSIS — F329 Major depressive disorder, single episode, unspecified: Secondary | ICD-10-CM

## 2020-04-22 DIAGNOSIS — Z8719 Personal history of other diseases of the digestive system: Secondary | ICD-10-CM

## 2020-04-22 DIAGNOSIS — M25561 Pain in right knee: Secondary | ICD-10-CM

## 2020-04-22 DIAGNOSIS — E785 Hyperlipidemia, unspecified: Secondary | ICD-10-CM

## 2020-04-22 DIAGNOSIS — H2513 Age-related nuclear cataract, bilateral: Secondary | ICD-10-CM

## 2020-04-22 DIAGNOSIS — F419 Anxiety disorder, unspecified: Secondary | ICD-10-CM

## 2020-04-22 DIAGNOSIS — M25512 Pain in left shoulder: Secondary | ICD-10-CM

## 2020-04-22 DIAGNOSIS — R87613 High grade squamous intraepithelial lesion on cytologic smear of cervix (HGSIL): Secondary | ICD-10-CM

## 2020-04-22 DIAGNOSIS — M130 Polyarthritis, unspecified: Secondary | ICD-10-CM

## 2020-04-22 DIAGNOSIS — M79672 Pain in left foot: Secondary | ICD-10-CM

## 2020-04-22 DIAGNOSIS — M79671 Pain in right foot: Secondary | ICD-10-CM

## 2020-04-22 DIAGNOSIS — Z951 Presence of aortocoronary bypass graft: Secondary | ICD-10-CM

## 2020-04-22 DIAGNOSIS — Z87891 Personal history of nicotine dependence: Secondary | ICD-10-CM

## 2020-04-22 DIAGNOSIS — M79642 Pain in left hand: Secondary | ICD-10-CM

## 2020-04-22 DIAGNOSIS — G4733 Obstructive sleep apnea (adult) (pediatric): Secondary | ICD-10-CM

## 2020-04-23 LAB — URIC ACID: Uric Acid, Serum: 5.7 mg/dL (ref 2.5–7.0)

## 2020-04-23 LAB — SEDIMENTATION RATE: Sed Rate: 14 mm/h (ref 0–30)

## 2020-04-23 LAB — HLA-B27 ANTIGEN: HLA-B27 Antigen: NEGATIVE

## 2020-04-23 LAB — CYCLIC CITRUL PEPTIDE ANTIBODY, IGG: Cyclic Citrullin Peptide Ab: <16 UNITS

## 2020-04-23 LAB — RHEUMATOID FACTOR: Rheumatoid fact SerPl-aCnc: <14 IU/mL (ref <14)

## 2020-04-23 NOTE — Progress Notes (Signed)
Polyps of the normal limits.  I will discuss results at the follow-up visit.

## 2020-04-24 ENCOUNTER — Other Ambulatory Visit: Payer: Self-pay

## 2020-04-24 ENCOUNTER — Ambulatory Visit
Admission: EM | Admit: 2020-04-24 | Discharge: 2020-04-24 | Disposition: A | Discharge status: Home / Self Care | Payer: 59 | Attending: Physician Assistant | Admitting: Physician Assistant

## 2020-04-24 DIAGNOSIS — R220 Localized swelling, mass and lump, head: Secondary | ICD-10-CM | POA: Diagnosis not present

## 2020-04-24 MED ORDER — CEPHALEXIN 500 MG PO CAPS
500.0000 mg | ORAL_CAPSULE | Freq: Four times a day (QID) | ORAL | 0 refills | Status: DC
Start: 2020-04-24 — End: 2020-05-16

## 2020-04-24 MED ORDER — FLUCONAZOLE 150 MG PO TABS
150.0000 mg | ORAL_TABLET | Freq: Every day | ORAL | 0 refills | Status: DC
Start: 2020-04-24 — End: 2020-05-16

## 2020-04-24 NOTE — ED Triage Notes (Signed)
Pt c/o painful "bite/rash" to left side of neck behind ear for several days. Also c/o possible abscess to inner nose/nares for approx 4 days. Right external area of bridge of nose edematous and tender. Denies fever, chills,n/v/d. 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

## 2020-04-24 NOTE — ED Provider Notes (Signed)
[facility]    CSN: [id number] Arrival date & time: 04/24/20  1407      History   Chief Complaint Chief Complaint  Patient presents with  . Rash  . Abscess    HPI [name] is a 49 y.o. female.   49 year old female with history of uncontrolled DM comes in for 4 day history of right nose bridge pain. No erythema, warmth. Feels possible abscess to the inner nares. Denies fever, chills, body aches. bactroban ointment as directed.      Past Medical History:  Diagnosis Date  . Anxiety   . Arthritis    fingers  . Cervical dysplasia   . Coronary artery disease   . Depression   . Diabetes mellitus    Type II  . Dyspnea 11/22/2013  . GERD (gastroesophageal reflux disease)   . High cholesterol   . History of kidney stones    passed  . Hypertension    no meds now  . Kidney stones   . Moderate episode of recurrent major depressive disorder (HCC) 12/28/2019  . Neuropathy    feet and legs  . Nuclear sclerotic cataract of both eyes 09/2019   Dr. Shea Evans  . PONV (postoperative nausea and vomiting)   . Sleep apnea    lost 70lbs no cpap x81yrs now    Patient Active Problem List   Diagnosis Date Noted  . Midline sternotomy scar 04/17/2020  . Left rib fracture 03/13/2020  . Moderate episode of recurrent major depressive disorder (HCC) 12/28/2019  . Encounter for post surgical wound check 12/06/2019  . Type 2 diabetes mellitus with diabetic polyneuropathy, with long-term current use of insulin (HCC) 11/16/2019  . Diabetes mellitus (HCC) 11/16/2019  . Type 2 diabetes mellitus with retinopathy, with long-term current use of insulin (HCC) 11/16/2019  . Dyslipidemia 11/16/2019  . Postop check 11/15/2019  . Diabetic retinopathy (HCC) 10/25/2019  . 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 Angina pectoris (HCC) 10/06/2019  . Left-sided chest pain 06/13/2019  . Post-operative state 06/06/2018  . HGSIL (high grade squamous intraepithelial lesion) on Pap smear of cervix  02/25/2018  . Right upper quadrant abdominal pain 12/30/2017  . Polyarthritis 12/30/2017  . OSA (obstructive sleep apnea) 11/22/2013  . Dyspnea 11/22/2013  . Uncontrolled type 2 diabetes mellitus with hyperosmolar nonketotic hyperglycemia (HCC) 07/21/2012  . HTN (hypertension) 07/21/2012  . Smoker 07/21/2012    Past Surgical History:  Procedure Laterality Date  . CARDIAC CATHETERIZATION     4-101yrs ago  . CERVICAL CONIZATION W/BX N/A 05/04/2018   Procedure: CONIZATION CERVIX WITH BIOPSY - COLD KNIFE;  Surgeon: Hermina Staggers, MD;  Location: [facility];  Service: Gynecology;  Laterality: N/A;  . CHOLECYSTECTOMY    . CORONARY ARTERY BYPASS GRAFT N/A 10/24/2019   Procedure: CORONARY ARTERY BYPASS GRAFTING (CABG) times four, using left radial artery harvested endoscopically and right greater saphenous vein harvested endoscopically.;  Surgeon: Kerin Perna, MD;  Location: [facility] OR;  Service: Open Heart Surgery;  Laterality: N/A;  . ENDOMETRIAL ABLATION     6 years ago  . INCISION AND DRAINAGE     for boil- buttocks  . INCISION AND DRAINAGE Left    Leg, Nephrotoxin fasciotimy  . IRRIGATION AND DEBRIDEMENT ABSCESS Right 03/17/2017   Procedure: IRRIGATION AND DEBRIDEMENT RIGHT THIGH ABSCESS;  Surgeon: Karie Soda, MD;  Location: [facility]S;  Service: General;  Laterality: Right;  . LAPAROSCOPIC APPENDECTOMY  10/04/2011   Procedure: APPENDECTOMY LAPAROSCOPIC;  Surgeon: Rulon Abide, DO;  Location: [facility];  Service: General;  Laterality: N/A;  . LEFT HEART CATH AND CORONARY ANGIOGRAPHY N/A 10/06/2019   Procedure: LEFT HEART CATH AND CORONARY ANGIOGRAPHY;  Surgeon: Swaziland, Peter M, MD;  Location: [facility] INVASIVE CV [facility];  Service: Cardiovascular;  Laterality: N/A;  . RADIAL ARTERY HARVEST Left 10/24/2019   Procedure: Radial Artery Harvest;  Surgeon: Kerin Perna, MD;  Location: [facility] OR;  Service: Open Heart Surgery;  Laterality: Left;  . TEE WITHOUT CARDIOVERSION N/A 10/24/2019    Procedure: TRANSESOPHAGEAL ECHOCARDIOGRAM (TEE);  Surgeon: Donata Clay, Theron Arista, MD;  Location: [facility] OR;  Service: Open Heart Surgery;  Laterality: N/A;    OB History    Gravida  3   Para  1   Term  0   Preterm  0   AB  2   Living  1     SAB  2   TAB  0   Ectopic  0   Multiple  0   Live Births  1            Home Medications    Prior to Admission medications   Medication Sig Start Date End Date Taking? Authorizing Provider  aspirin 325 MG EC tablet Take 1 tablet (325 mg total) by mouth daily. 10/29/19  Yes Gold, Wayne E, PA-C  atorvastatin (LIPITOR) 80 MG tablet TAKE 1 TABLET(80 MG) BY MOUTH DAILY AT 6 PM 04/10/20  Yes Marcine Matar, MD  azelastine (ASTELIN) 0.1 % nasal spray Place 2 sprays into both nostrils 2 (two) times daily. 02/20/20  Yes Ashla Murph V, PA-C  esomeprazole (NEXIUM) 20 MG capsule Take 20 mg by mouth daily.    Yes [provider]  FLUoxetine (PROZAC) 40 MG capsule TAKE 1 CAPSULE(40 MG) BY MOUTH DAILY 04/02/20  Yes Marcine Matar, MD  furosemide (LASIX) 20 MG tablet Take 1 tablet (20 mg total) by mouth as needed (for ankle swelling). 04/18/20  Yes Kerin Perna, MD  gabapentin (NEURONTIN) 300 MG capsule 3 caps PO  in the morning, 1 in the afternoon and 3 capsules at night 12/28/19  Yes Marcine Matar, MD  insulin detemir (LEVEMIR FLEXTOUCH) 100 UNIT/ML FlexPen Inject 37 Units into the skin 2 (two) times daily. 11/16/19  Yes Shamleffer, Konrad Dolores, MD  JARDIANCE 10 MG TABS tablet TAKE 1 TABLET BY MOUTH DAILY BEFORE BREAKFAST 03/19/20  Yes Shamleffer, Konrad Dolores, MD  Melatonin 10 MG CAPS Take 10 mg by mouth at bedtime as needed (sleep).   Yes [provider]  metFORMIN (GLUCOPHAGE-XR) 500 MG 24 hr tablet Take 2 tablets (1,000 mg total) by mouth daily with breakfast. 02/21/20  Yes Marcine Matar, MD  metoprolol succinate (TOPROL XL) 25 MG 24 hr tablet Take 0.5 tablets (12.5 mg total) by mouth daily. 11/13/19  Yes Little Ishikawa, MD  VICTOZA 18 MG/3ML SOPN INJECT 0.3 MLS(1.8 MG) INTO THE SKIN DAILY 03/07/20  Yes Marcine Matar, MD  acetaminophen (TYLENOL) 500 MG tablet Take 1,000 mg by mouth every 6 (six) hours as needed for moderate pain or headache.    [provider]  albuterol (VENTOLIN HFA) 108 (90 Base) MCG/ACT inhaler Inhale 1-2 puffs into the lungs every 6 (six) hours as needed for wheezing or shortness of breath. 02/20/20   Cathie Hoops, Manika Hast V, PA-C  cephALEXin (KEFLEX) 500 MG capsule Take 1 capsule (500 mg total) by mouth 4 (four) times daily. 04/24/20   Belinda Fisher, PA-C  ezetimibe (ZETIA) 10 MG tablet Take 1 tablet (10 mg total) by mouth daily. 10/04/19 04/22/20  Little Ishikawa, MD  fluconazole (DIFLUCAN) 150 MG tablet Take 1 tablet (150 mg total) by mouth daily. Take second dose 72 hours later if symptoms still persists. 04/24/20   Cathie Hoops, Stormy Sabol V, PA-C  fluticasone (FLONASE) 50 MCG/ACT nasal spray Place 2 sprays into both nostrils daily as needed for allergies or rhinitis.    [provider]  glucose blood (TRUE METRIX BLOOD GLUCOSE TEST) test strip Use as instructed 01/13/18   Marcine Matar, MD  Insulin Pen Needle (B-D UF III MINI PEN NEEDLES) 31G X 5 MM MISC Use as instructed. Inject into the skin three times daily 05/16/19   Claiborne Rigg, NP  TRUEPLUS SAFETY LANCETS 28G MISC Use as directed 01/13/18   Marcine Matar, MD    Family History Family History  Problem Relation Age of Onset  . Aneurysm Mother   . Pancreatic cancer Mother   . Heart attack Father   . Stroke Father   . Heart disease Sister   . Breast cancer Paternal Grandmother   . Congestive Heart Failure Sister   . Osteoarthritis Sister   . Osteoarthritis Sister     Social History Social History   Tobacco Use  . Smoking status: Former Smoker    Packs/day: 2.00    Years: 24.00    Pack years: 48.00    Types: E-cigarettes, Cigarettes    Quit date: 04/22/2017    Years since quitting: 3.0  . Smokeless  tobacco: Never Used  . Tobacco comment: vape  Vaping Use  . Vaping Use: Some days  . Devices: 1 pod last 3-4 days  Substance Use Topics  . Alcohol use: Yes    Comment: rare  . Drug use: No     Allergies   Sulfa antibiotics and Codeine   Review of Systems Review of Systems  Reason unable to perform ROS: See HPI as above.     Physical Exam Triage Vital Signs ED Triage Vitals  Enc Vitals Group     BP 04/24/20 1542 106/74     Pulse Rate 04/24/20 1542 83     Resp 04/24/20 1542 18     Temp 04/24/20 1542 98.2 F (36.8 C)     Temp Source 04/24/20 1542 Oral     SpO2 04/24/20 1542 96 %     Weight --      Height --      Head Circumference --      Peak Flow --      Pain Score 04/24/20 1540 5     Pain Loc --      Pain Edu? --      Excl. in GC? --    No data found.  Updated Vital Signs BP 106/74 (BP Location: Left Arm)   Pulse 83   Temp 98.2 F (36.8 C) (Oral)   Resp 18   SpO2 96%   Physical Exam Constitutional:      General: She is not in acute distress.    Appearance: Normal appearance. She is well-developed. She is not toxic-appearing or diaphoretic.  HENT:     Head: Normocephalic and atraumatic.     Nose:      Comments: No obvious abscess to the nares. Bilateral turbinates visible without significant swelling.  Eyes:     Conjunctiva/sclera: Conjunctivae normal.     Pupils: Pupils are equal, round, and reactive to light.  Pulmonary:  Effort: Pulmonary effort is normal. No respiratory distress.  Musculoskeletal:     Cervical back: Normal range of motion and neck supple.  Skin:    General: Skin is warm and dry.     Comments: Healing wound behind left ear without erythema, warmth, drainage. No tenderness.   Neurological:     Mental Status: She is alert and oriented to person, place, and time.      UC Treatments / Results  Labs (all labs ordered are listed, but only abnormal results are displayed) Labs Reviewed - No data to  display  EKG   Radiology No results found.  Procedures Procedures (including critical care time)  Medications Ordered in UC Medications - No data to display  Initial Impression / Assessment and Plan / UC Course  I have reviewed the triage vital signs and the nursing notes.  Pertinent labs & imaging results that were available during my care of the patient were reviewed by me and considered in my medical decision making (see chart for details).    No signs of abscess within the nares.  Discussed possibly patient is describing the turbinates.  However, does have induration to the right nose bridge without erythema or warmth.  Given tenderness to palpation, history of uncontrolled diabetes, will cover for cellulitis/erysipelas with Keflex.  Return precautions given.  Otherwise to follow-up with PCP as scheduled for reevaluation.  Final Clinical Impressions(s) / UC Diagnoses   Final diagnoses:  Swelling of nose    ED Prescriptions    Medication Sig Dispense Auth. Provider   cephALEXin (KEFLEX) 500 MG capsule Take 1 capsule (500 mg total) by mouth 4 (four) times daily. 28 capsule Lukis Bunt V, PA-C   fluconazole (DIFLUCAN) 150 MG tablet Take 1 tablet (150 mg total) by mouth daily. Take second dose 72 hours later if symptoms still persists. 2 tablet Belinda Fisher, PA-C     PDMP not reviewed this encounter.   Belinda Fisher, PA-C 04/24/20 1617

## 2020-04-24 NOTE — Discharge Instructions (Signed)
Start keflex as directed to cover for skin infection. Warm compress. Follow up with PCP as scheduled for reevaluation. If worsening symptoms with fever, spreading redness, warmth, go to the emergency department for further evaluation.

## 2020-04-27 LAB — COLOGUARD: Cologuard: NEGATIVE

## 2020-04-29 ENCOUNTER — Ambulatory Visit: Payer: 59 | Attending: Internal Medicine | Admitting: Internal Medicine

## 2020-04-29 ENCOUNTER — Encounter: Payer: Self-pay | Admitting: Internal Medicine

## 2020-04-29 DIAGNOSIS — Z2821 Immunization not carried out because of patient refusal: Secondary | ICD-10-CM | POA: Diagnosis not present

## 2020-04-29 DIAGNOSIS — F3342 Major depressive disorder, recurrent, in full remission: Secondary | ICD-10-CM

## 2020-04-29 DIAGNOSIS — M130 Polyarthritis, unspecified: Secondary | ICD-10-CM

## 2020-04-29 DIAGNOSIS — IMO0002 Reserved for concepts with insufficient information to code with codable children: Secondary | ICD-10-CM

## 2020-04-29 DIAGNOSIS — E1165 Type 2 diabetes mellitus with hyperglycemia: Secondary | ICD-10-CM

## 2020-04-29 DIAGNOSIS — Z794 Long term (current) use of insulin: Secondary | ICD-10-CM

## 2020-04-29 DIAGNOSIS — Z9114 Patient's other noncompliance with medication regimen: Secondary | ICD-10-CM

## 2020-04-29 DIAGNOSIS — N1831 Chronic kidney disease, stage 3a: Secondary | ICD-10-CM | POA: Diagnosis not present

## 2020-04-29 DIAGNOSIS — E1140 Type 2 diabetes mellitus with diabetic neuropathy, unspecified: Secondary | ICD-10-CM | POA: Diagnosis not present

## 2020-04-29 HISTORY — DX: Chronic kidney disease, stage 3a: N18.31

## 2020-04-29 MED ORDER — METOPROLOL SUCCINATE ER 25 MG PO TB24: 12.5000 mg | ORAL_TABLET | Freq: Every day | ORAL | 3 refills | Status: DC

## 2020-04-29 MED ORDER — FLUOXETINE HCL 40 MG PO CAPS: ORAL_CAPSULE | ORAL | 2 refills | Status: AC

## 2020-04-29 MED ORDER — ATORVASTATIN CALCIUM 80 MG PO TABS: ORAL_TABLET | ORAL | 2 refills | Status: DC

## 2020-04-29 NOTE — Progress Notes (Signed)
Virtual Visit via Telephone Note Due to current restrictions/limitations of in-office visits due to the COVID-19 pandemic, this scheduled clinical appointment was converted to a telehealth visit  I connected with [name] on 04/29/20 at  4:10 PM EDT by telephone and verified that I am speaking with the correct person using two identifiers. I am in my office.  The patient is at home.  Only the patient and myself participated in this encounter.  I discussed the limitations, risks, security and privacy concerns of performing an evaluation and management service by telephone and the availability of in person appointments. I also discussed with the patient that there may be a patient responsible charge related to this service. The patient expressed understanding and agreed to proceed.   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  Last seen 11/2019. Purpose of today's visit is chronic ds management.  Polyarthritis:  Saw Dr. Corliss Skains last wk.  Looks like from the work-up she has more osteoarthritis and rheumatoid arthritis less likely.  Larey Seat the end of June while stepping off a curb.  Broke 2 ribs and a toe.  Healing from that.  CAD/s/p CABG: She was having some swelling in the left leg about 2-3 times a week since vein was stripped from this leg for the CABG.  Vascular surgeon gave her some furosemide to take as needed.  Compliant with taking aspirin, metoprolol and atorvastatin.  DIABETES TYPE 2 Last A1C:   Lab Results  Component Value Date   HGBA1C 15.1 (H) 10/20/2019   Med Adherence:  []  Yes    [x]  No -reports stomach pain, gas and nausea when she takes Victoza.  She stopped it 2 mths ago.  Currently taking Metformin, jardiance and Levemir.  Takes the Levemir QOD instead of daily.   States she forgots to take the Levemir.  Feels she would be better in taking her meds if she is in a better place mentally.  "Its just so overwhelming."  She has history of depression for which she is on Prozac.  She is not plugged in with behavioral health.  She is agreeable for referral. -She wonders whether there is an instrument whereby insulin is automatically injected with out her having to remember to give herself insulin.  She had seen  Dr. Brooks Sailors a while back Home Monitoring?  []  Yes    []  No Home glucose results range: Diet Adherence: Reports she only eats about 1 meal a day but she has gained wgh.  Up to 240 lbs.  Discouraged because at one point she was down to 180 lbs.  Will be seeing a nurse later this wk through the cardiologist for dietary counseling. Reading labels.   Exercise: tries to walk so but too hot outside.   Hypoglycemic episodes?: []  Yes    [x]  No Numbness of the feet? []  Yes    []  No Retinopathy hx? []  Yes    []  No Last eye exam:  Comments:  We talked about her kidney function not being 100%.  Her GFR has fluctuated from 39 to 51 over the past several months.  HM:  Turned in Cologuard several wks ago.  I have not received results as yet.  Declines flu shot and COVID.     Outpatient Encounter Medications as of 04/29/2020  Medication Sig  . acetaminophen (TYLENOL) 500 MG tablet Take 1,000 mg by  mouth every 6 (six) hours as needed for moderate pain or headache.  . albuterol (VENTOLIN HFA) 108 (90 Base) MCG/ACT inhaler Inhale 1-2 puffs into the lungs every 6 (six) hours as needed for wheezing or shortness of breath.  Marland Kitchen aspirin 325 MG EC tablet Take 1 tablet (325 mg total) by mouth daily.  Marland Kitchen atorvastatin (LIPITOR) 80 MG tablet TAKE 1 TABLET(80 MG) BY MOUTH DAILY AT 6 PM  . azelastine (ASTELIN) 0.1 % nasal spray Place 2 sprays into both nostrils 2 (two) times daily.  . cephALEXin (KEFLEX) 500 MG capsule Take 1 capsule (500 mg total) by mouth 4 (four) times daily.   Marland Kitchen esomeprazole (NEXIUM) 20 MG capsule Take 20 mg by mouth daily.   Marland Kitchen ezetimibe (ZETIA) 10 MG tablet Take 1 tablet (10 mg total) by mouth daily.  . fluconazole (DIFLUCAN) 150 MG tablet Take 1 tablet (150 mg total) by mouth daily. Take second dose 72 hours later if symptoms still persists.  Marland Kitchen FLUoxetine (PROZAC) 40 MG capsule TAKE 1 CAPSULE(40 MG) BY MOUTH DAILY  . fluticasone (FLONASE) 50 MCG/ACT nasal spray Place 2 sprays into both nostrils daily as needed for allergies or rhinitis.  . furosemide (LASIX) 20 MG tablet Take 1 tablet (20 mg total) by mouth as needed (for ankle swelling).  . gabapentin (NEURONTIN) 300 MG capsule 3 caps PO  in the morning, 1 in the afternoon and 3 capsules at night  . glucose blood (TRUE METRIX BLOOD GLUCOSE TEST) test strip Use as instructed  . insulin detemir (LEVEMIR FLEXTOUCH) 100 UNIT/ML FlexPen Inject 37 Units into the skin 2 (two) times daily.  . Insulin Pen Needle (B-D UF III MINI PEN NEEDLES) 31G X 5 MM MISC Use as instructed. Inject into the skin three times daily  . JARDIANCE 10 MG TABS tablet TAKE 1 TABLET BY MOUTH DAILY BEFORE BREAKFAST  . Melatonin 10 MG CAPS Take 10 mg by mouth at bedtime as needed (sleep).  . metFORMIN (GLUCOPHAGE-XR) 500 MG 24 hr tablet Take 2 tablets (1,000 mg total) by mouth daily with breakfast.  . metoprolol succinate (TOPROL XL) 25 MG 24 hr tablet Take 0.5 tablets (12.5 mg total) by mouth daily.  . TRUEPLUS SAFETY LANCETS 28G MISC Use as directed  . VICTOZA 18 MG/3ML SOPN INJECT 0.3 MLS(1.8 MG) INTO THE SKIN DAILY   No facility-administered encounter medications on file as of 04/29/2020.     Observations/Objective:    Chemistry      Component Value Date/Time   NA 141 02/20/2020 1703   K 4.2 02/20/2020 1703   CL 99 02/20/2020 1703   CO2 24 02/20/2020 1703   BUN 25 (H) 02/20/2020 1703   CREATININE 1.27 (H) 02/20/2020 1703      Component Value Date/Time   CALCIUM 10.2 02/20/2020 1703   ALKPHOS 126 (H) 02/20/2020 1703    AST 20 02/20/2020 1703   ALT 27 02/20/2020 1703   BILITOT 0.2 02/20/2020 1703     Lab Results  Component Value Date   WBC 6.9 02/20/2020   HGB 15.8 02/20/2020   HCT 49.5 (H) 02/20/2020   MCV 79 02/20/2020   PLT 285 02/20/2020    Assessment and Plan: 1. Uncontrolled type 2 diabetes mellitus with diabetic neuropathy, with long-term current use of insulin (HCC) -Stop Victoza. Encourage patient to set alarm on her phone as a way of remembering to take the Levemir.  We also discussed ways that she can exercise indoors since the weather is too hot to go  outside.  She is agreeable for referral back to the endocrinologist  - Ambulatory referral to Endocrinology - Hemoglobin A1c; Future  2. Recurrent major depressive disorder, in full remission (HCC) - Ambulatory referral to Psychiatry - FLUoxetine (PROZAC) 40 MG capsule; TAKE 1 CAPSULE(40 MG) BY MOUTH DAILY  Dispense: 90 capsule; Refill: 2  3. Stage 3a chronic kidney disease Advised patient to avoid NSAIDs.  Recommend taking Tylenol arthritis and diclofenac's gel.  She has diclofenac's gel at home but states she does not find it helpful. - Basic Metabolic Panel; Future  4. Influenza vaccination declined Offered.  Patient declined.  5. COVID-19 virus vaccination declined Patient declines.  6. Polyarthritis Looks more like osteoarthritis in some of her joints.  She will follow-up with the rheumatologist next month.  7. Noncompliance with medication regimen - Ambulatory referral to Psychiatry  Follow Up Instructions:    I discussed the assessment and treatment plan with the patient. The patient was provided an opportunity to ask questions and all were answered. The patient agreed with the plan and demonstrated an understanding of the instructions.   The patient was advised to call back or seek an in-person evaluation if the symptoms worsen or if the condition fails to improve as anticipated.  I provided 26 minutes of  non-face-to-face time during this encounter.   Jonah Blue, MD

## 2020-05-01 ENCOUNTER — Ambulatory Visit: Payer: 59

## 2020-05-03 ENCOUNTER — Ambulatory Visit: Payer: 59 | Admitting: Internal Medicine

## 2020-05-12 NOTE — Progress Notes (Deleted)
Office Visit Note  Patient: [name]             Date of Birth: February 02, 1971           MRN: [id number]             PCP: Ladell Pier, MD Referring: Ladell Pier, MD Visit Date: 05/24/2020 Occupation: _0 @  Subjective:  No chief complaint on file.   History of Present Illness: [name] is a 49 y.o. female ***   Activities of Daily Living:  Patient reports morning stiffness for *** {minute/hour:19697}.   Patient {ACTIONS;DENIES/REPORTS:21021675::"Denies"} nocturnal pain.  Difficulty dressing/grooming: {ACTIONS;DENIES/REPORTS:21021675::"Denies"} Difficulty climbing stairs: {ACTIONS;DENIES/REPORTS:21021675::"Denies"} Difficulty getting out of chair: {ACTIONS;DENIES/REPORTS:21021675::"Denies"} Difficulty using hands for taps, buttons, cutlery, and/or writing: {ACTIONS;DENIES/REPORTS:21021675::"Denies"}  No Rheumatology ROS completed.   PMFS History:  Patient Active Problem List   Diagnosis Date Noted  . Stage 3a chronic kidney disease 04/29/2020  . COVID-19 virus vaccination declined 04/29/2020  . Influenza vaccination declined 04/29/2020  . Midline sternotomy scar 04/17/2020  . Left rib fracture 03/13/2020  . Moderate episode of recurrent major depressive disorder ([place]) 12/28/2019  . Encounter for post surgical wound check 12/06/2019  . Type 2 diabetes mellitus with diabetic polyneuropathy, with long-term current use of insulin ([name]) 11/16/2019  . Diabetes mellitus ([place]) 11/16/2019  . Type 2 diabetes mellitus with retinopathy, with long-term current use of insulin ([place]) 11/16/2019  . Dyslipidemia 11/16/2019  . Postop check 11/15/2019  . Diabetic retinopathy ([name]) 10/25/2019  . 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 Angina pectoris ([place]) 10/06/2019  . Left-sided chest pain 06/13/2019  . Post-operative state 06/06/2018  . HGSIL (high grade squamous intraepithelial lesion) on Pap smear of cervix 02/25/2018  . Right upper quadrant abdominal pain 12/30/2017    . Polyarthritis 12/30/2017  . OSA (obstructive sleep apnea) 11/22/2013  . Dyspnea 11/22/2013  . Uncontrolled type 2 diabetes mellitus with hyperosmolar nonketotic hyperglycemia ([name]) 07/21/2012  . HTN (hypertension) 07/21/2012  . Smoker 07/21/2012    Past Medical History:  Diagnosis Date  . Anxiety   . Arthritis    fingers  . Cervical dysplasia   . Coronary artery disease   . Depression   . Diabetes mellitus    Type II  . Dyspnea 11/22/2013  . GERD (gastroesophageal reflux disease)   . High cholesterol   . History of kidney stones    passed  . Hypertension    no meds now  . Kidney stones   . Moderate episode of recurrent major depressive disorder ([name]) 12/28/2019  . Neuropathy    feet and legs  . Nuclear sclerotic cataract of both eyes 09/2019   Dr. Idolina Primer  . PONV (postoperative nausea and vomiting)   . Sleep apnea    lost 70lbs no cpap x56yr now  . Stage 3a chronic kidney disease 04/29/2020    Family History  Problem Relation Age of Onset  . Aneurysm Mother   . Pancreatic cancer Mother   . Heart attack Father   . Stroke Father   . Heart disease Sister   . Breast cancer Paternal Grandmother   . Congestive Heart Failure Sister   . Osteoarthritis Sister   . Osteoarthritis Sister    Past Surgical History:  Procedure Laterality Date  . CARDIAC CATHETERIZATION     4-52yrago  . CERVICAL CONIZATION W/BX N/A 05/04/2018   Procedure: CONIZATION CERVIX WITH BIOPSY - COLD KNIFE;  Surgeon: ErChancy MilroyMD;  [facility]  Service: Gynecology;  Laterality: N/A;  . CHOLECYSTECTOMY    . CORONARY ARTERY BYPASS GRAFT N/A 10/24/2019   Procedure: CORONARY ARTERY BYPASS GRAFTING (CABG) times four, using left radial artery harvested endoscopically and right greater saphenous vein harvested endoscopically.;  Surgeon: Ivin Poot, MD;  Location: [facility];  Service: Open Heart Surgery;  Laterality: N/A;  . ENDOMETRIAL ABLATION     6 years ago  . INCISION AND  DRAINAGE     for boil- buttocks  . INCISION AND DRAINAGE Left    Leg, Nephrotoxin fasciotimy  . IRRIGATION AND DEBRIDEMENT ABSCESS Right 03/17/2017   Procedure: IRRIGATION AND DEBRIDEMENT RIGHT THIGH ABSCESS;  Surgeon: Michael Boston, MD;  [facility];  Service: General;  Laterality: Right;  . LAPAROSCOPIC APPENDECTOMY  10/04/2011   Procedure: APPENDECTOMY LAPAROSCOPIC;  Surgeon: Judieth Keens, DO;  [facility];  Service: General;  Laterality: N/A;  . LEFT HEART CATH AND CORONARY ANGIOGRAPHY N/A 10/06/2019   Procedure: LEFT HEART CATH AND CORONARY ANGIOGRAPHY;  Surgeon: Martinique, Peter M, MD;  Location: [facility];  Service: Cardiovascular;  Laterality: N/A;  . RADIAL ARTERY HARVEST Left 10/24/2019   Procedure: Radial Artery Harvest;  Surgeon: Ivin Poot, MD;  Location: [facility];  Service: Open Heart Surgery;  Laterality: Left;  . TEE WITHOUT CARDIOVERSION N/A 10/24/2019   Procedure: TRANSESOPHAGEAL ECHOCARDIOGRAM (TEE);  Surgeon: Prescott Gum, Collier Salina, MD;  Location: [facility];  Service: Open Heart Surgery;  Laterality: N/A;   Social History   Social History Narrative  . Not on file   Immunization History  Administered Date(s) Administered  . Influenza Split 05/31/2013  . Influenza,inj,Quad PF,6+ Mos 06/05/2019  . Pneumococcal Polysaccharide-23 03/29/2017  . Tdap 01/27/2018     Objective: Vital Signs: There were no vitals taken for this visit.   Physical Exam   Musculoskeletal Exam: ***  CDAI Exam: CDAI Score: -- Patient Global: --; Provider Global: -- Swollen: --; Tender: -- Joint Exam 05/24/2020   No joint exam has been documented for this visit   There is currently no information documented on the homunculus. Go to the Rheumatology activity and complete the homunculus joint exam.  Investigation: No additional findings.  Imaging: DG Chest 2 View  Result Date: 04/17/2020 CLINICAL DATA:  Status post coronary artery bypass grafting. Recent fall with pain  EXAM: CHEST - 2 VIEW COMPARISON:  February 20, 2020. FINDINGS: The lungs are clear. The heart size and pulmonary vascularity are normal. Patient is status post coronary artery bypass grafting. No adenopathy. No pneumothorax. No bone lesions evident. IMPRESSION: Status post coronary artery bypass grafting. Heart size normal. Lungs clear. No pneumothorax. Electronically Signed   By: Lowella Grip III M.D.   On: 04/17/2020 13:14   XR Foot 2 Views Left  Result Date: 04/22/2020 First MTP, PIP and DIP narrowing was noted.  No intertarsal, tibiotalar or subtalar joint space narrowing was noted.  Inferior calcaneal spur was noted. Impression: These findings are consistent with osteoarthritis of the foot.  XR Foot 2 Views Right  Result Date: 04/22/2020 First MTP, PIP and DIP narrowing was noted.  No intertarsal, tibiotalar or subtalar joint space narrowing was noted.  Inferior calcaneal spur was noted. Impression: These findings are consistent with osteoarthritis of the foot.  XR Hand 2 View Left  Result Date: 04/22/2020 CMC, PIP and DIP narrowing was noted.  No MCP, intercarpal or radiocarpal joint space narrowing was noted.  No erosive changes were noted.  Subluxation of fifth PIP joint was  noted.  Possible old fracture of ulnar styloid was noted.  Sutures were noted over the radial aspect of her radius. Impression: These findings are consistent with osteoarthritis of the hand.  XR Hand 2 View Right  Result Date: 04/22/2020 CMC, PIP and DIP narrowing was noted.  No MCP, intercarpal or radiocarpal joint space narrowing was noted.  No erosive changes were noted. Impression: These findings are consistent with osteoarthritis of the hand.  XR KNEE 3 VIEW LEFT  Result Date: 04/22/2020 No medial lateral compartment narrowing was noted.  No chondrocalcinosis was noted.  No patellofemoral narrowing was noted. Impression: Unremarkable x-ray of the knee joint.  XR Shoulder Left  Result Date: 04/22/2020 No  glenohumeral or acromioclavicular joint space narrowing was noted.  No chondrocalcinosis was noted. Impression: Unremarkable x-ray of the shoulder joint.   Recent Labs: Lab Results  Component Value Date   WBC 6.9 02/20/2020   HGB 15.8 02/20/2020   PLT 285 02/20/2020   NA 141 02/20/2020   K 4.2 02/20/2020   CL 99 02/20/2020   CO2 24 02/20/2020   GLUCOSE 127 (H) 02/20/2020   BUN 25 (H) 02/20/2020   CREATININE 1.27 (H) 02/20/2020   BILITOT 0.2 02/20/2020   ALKPHOS 126 (H) 02/20/2020   AST 20 02/20/2020   ALT 27 02/20/2020   PROT 7.0 02/20/2020   ALBUMIN 4.4 02/20/2020   CALCIUM 10.2 02/20/2020   GFRAA 57 (L) 02/20/2020   April 22, 2020 ESR 14, uric acid 5.7, RF negative, anti-CCP negative, HLA-B27 negative  Speciality Comments: No specialty comments available.  Procedures:  No procedures performed Allergies: Sulfa antibiotics and Codeine   Assessment / Plan:     Visit Diagnoses: Primary osteoarthritis of both hands - Clinical and radiographic findings are consistent with osteoarthritis.  All autoimmune work-up was negative.  Primary osteoarthritis of both feet - Clinical and radiographic findings were consistent with osteoarthritis.  Chronic left shoulder pain - X-ray was unremarkable.  A handout on shoulder joint exercises was given.  Chronic pain of left knee - X-ray was unremarkable.  A handout on knee joint exercises was given.  Essential hypertension  Dyslipidemia  S/P CABG x 4  Uncontrolled type 2 diabetes mellitus with hyperosmolar nonketotic hyperglycemia (HCC)  History of gastroesophageal reflux (GERD)  History of kidney stones  Anxiety and depression  OSA (obstructive sleep apnea)  HGSIL (high grade squamous intraepithelial lesion) on Pap smear of cervix  Nuclear sclerotic cataract of both eyes  Former smoker  Orders: No orders of the defined types were placed in this encounter.  No orders of the defined types were placed in this  encounter.   Face-to-face time spent with patient was *** minutes. Greater than 50% of time was spent in counseling and coordination of care.  Follow-Up Instructions: No follow-ups on file.   Bo Merino, MD  Note - This record has been created using Editor, commissioning.  Chart creation errors have been sought, but may not always  have been located. Such creation errors do not reflect on  the standard of medical care.

## 2020-05-15 ENCOUNTER — Telehealth: Payer: Self-pay | Admitting: Internal Medicine

## 2020-05-15 ENCOUNTER — Other Ambulatory Visit: Payer: Self-pay

## 2020-05-15 NOTE — Telephone Encounter (Signed)
Contacted pt and went over over cologuard results. Pt is aware and doesn't have any questions or concerns

## 2020-05-15 NOTE — Telephone Encounter (Signed)
Phone call placed to [facility] physician service line today.  I spoke with a representative by the name of Malena Edman.  Informed her that I was calling to get the results of this patient's Cologuard test that I had ordered back in April.  On my last visit with the patient the end of August, she told me that she had mailed the specimen but never received the results.  I have not received the results either.  Malena Edman tells me that it was resulted on April 27, 2020 as negative.  She states that they did fax the results to [place] but looks like the fax did not go through on the [phone] which is our correct fax number.  She also states that it went through on the interface.  I question which interface she was referring to as to my knowledge [facility] cologuard results does not interface with our Epic system for lab results.  I usually receive results on paper.  She told me that she will refax results to me and will have a sales rep reach out to me about the interface.

## 2020-05-16 ENCOUNTER — Ambulatory Visit (INDEPENDENT_AMBULATORY_CARE_PROVIDER_SITE_OTHER): Payer: 59 | Admitting: Internal Medicine

## 2020-05-16 ENCOUNTER — Encounter: Payer: Self-pay | Admitting: Internal Medicine

## 2020-05-16 ENCOUNTER — Other Ambulatory Visit: Payer: Self-pay

## 2020-05-16 VITALS — BP 116/64 | HR 73 | Ht 69.0 in | Wt 240.8 lb

## 2020-05-16 DIAGNOSIS — IMO0002 Reserved for concepts with insufficient information to code with codable children: Secondary | ICD-10-CM

## 2020-05-16 DIAGNOSIS — E1140 Type 2 diabetes mellitus with diabetic neuropathy, unspecified: Secondary | ICD-10-CM | POA: Diagnosis not present

## 2020-05-16 DIAGNOSIS — E1165 Type 2 diabetes mellitus with hyperglycemia: Secondary | ICD-10-CM

## 2020-05-16 DIAGNOSIS — Z794 Long term (current) use of insulin: Secondary | ICD-10-CM

## 2020-05-16 MED ORDER — PEN NEEDLES 32G X 4 MM MISC: 1.0000 | Freq: Four times a day (QID) | 6 refills | Status: DC

## 2020-05-16 MED ORDER — PEN NEEDLES 32G X 4 MM MISC
1.0000 | Freq: Four times a day (QID) | 6 refills | Status: DC
Start: 2020-05-16 — End: 2020-09-19

## 2020-05-16 MED ORDER — NOVOLOG FLEXPEN 100 UNIT/ML ~~LOC~~ SOPN: 10.0000 [IU] | PEN_INJECTOR | Freq: Three times a day (TID) | SUBCUTANEOUS | 6 refills | Status: DC

## 2020-05-16 MED ORDER — TRESIBA FLEXTOUCH 100 UNIT/ML ~~LOC~~ SOPN: 40.0000 [IU] | PEN_INJECTOR | Freq: Every day | SUBCUTANEOUS | 6 refills | Status: DC

## 2020-05-16 NOTE — Progress Notes (Signed)
Name: [name]  Age/ Sex: 49 y.o., female   MRN/ DOB: [id number], November 18, 1970     PCP: Marcine Matar, MD   Reason for Endocrinology Evaluation: Type 2 Diabetes Mellitus  Initial Endocrine Consultative Visit: 11/16/2019    PATIENT IDENTIFIER: [name] is a 49 y.o. female with a past medical history of CAD, T2DM and HTN. The patient has followed with Endocrinology clinic since 11/16/2019 for consultative assistance with management of her diabetes.  DIABETIC HISTORY:  Ms. Finner was diagnosed with DM in 2004. She is intolerant to higher doses of metformin, nor to Trulicity/ Victoza  due to diarrhea. She was able to tolerate Victoza which was started 05/2019. Her hemoglobin A1c has ranged from 7.2% in 2013, peaking at >15.0% in 2019.    On her initial visit to our clinic she had an A1c of 15.1% . She was on Levemir, Metformin  And victoza and we added [place].    Victoza stopped 05/2020 due to nausea and diarrhea  SUBJECTIVE:   During the last visit (11/16/2019): A1c 15.1 %.We continued levemir, metformin and Victoza   Today (05/16/2020): Ms. Khachatryan is here for a follow up on diabetes management. She has not been seen in 6 months, she missed her 3 month follow up.  She checks her blood sugars rarely .   Admits to forgetting to take medications and dietary indiscretions was drinking a gallon of sweet tea a day.   Has an appointment with a counselor   HOME DIABETES REGIMEN:  Levemir  37 units twice a day - takes it 4 days of the week  Metformin 500 mg XR , 2 tablet with breakfast  Victoza 1.8 mg daily - makes her sick  Jardiance 10 mg daily       Statin: Yes ACE-I/ARB: no    METER DOWNLOAD SUMMARY: Date range evaluated: 8/18-9/16/2021 Average Number Tests/Day = 0.2 Overall Mean FS Glucose = 414 Standard Deviation = 160  BG Ranges: Low = 187 High = 594   Hypoglycemic Events/30 Days: BG < 50 = 0 Episodes of symptomatic severe hypoglycemia =  0    DIABETIC COMPLICATIONS: Microvascular complications:   Neuropathy, retinopathy  Denies: CKD  Last Eye Exam: Completed   Macrovascular complications:   CAD ( S/P CABG 10/2019)  Denies:  CVA, PVD   HISTORY:  Past Medical History:  Past Medical History:  Diagnosis Date  . Anxiety   . Arthritis    fingers  . Cervical dysplasia   . Coronary artery disease   . Depression   . Diabetes mellitus    Type II  . Dyspnea 11/22/2013  . GERD (gastroesophageal reflux disease)   . High cholesterol   . History of kidney stones    passed  . Hypertension    no meds now  . Kidney stones   . Moderate episode of recurrent major depressive disorder (HCC) 12/28/2019  . Neuropathy    feet and legs  . Nuclear sclerotic cataract of both eyes 09/2019   Dr. Shea Evans  . PONV (postoperative nausea and vomiting)   . Sleep apnea    lost 70lbs no cpap x61yrs now  . Stage 3a chronic kidney disease 04/29/2020    Past Surgical History:  Past Surgical History:  Procedure Laterality Date  . CARDIAC CATHETERIZATION     4-71yrs ago  . CERVICAL CONIZATION W/BX N/A 05/04/2018   Procedure: CONIZATION CERVIX WITH BIOPSY - COLD KNIFE;  Surgeon: Hermina Staggers, MD;  Location: [facility];  Service: Gynecology;  Laterality: N/A;  . CHOLECYSTECTOMY    . CORONARY ARTERY BYPASS GRAFT N/A 10/24/2019   Procedure: CORONARY ARTERY BYPASS GRAFTING (CABG) times four, using left radial artery harvested endoscopically and right greater saphenous vein harvested endoscopically.;  Surgeon: Kerin Perna, MD;  Location: [facility];  Service: Open Heart Surgery;  Laterality: N/A;  . ENDOMETRIAL ABLATION     6 years ago  . INCISION AND DRAINAGE     for boil- buttocks  . INCISION AND DRAINAGE Left    Leg, Nephrotoxin fasciotimy  . IRRIGATION AND DEBRIDEMENT ABSCESS Right 03/17/2017   Procedure: IRRIGATION AND DEBRIDEMENT RIGHT THIGH ABSCESS;  Surgeon: Karie Soda, MD;  [facility];  Service: General;   Laterality: Right;  . LAPAROSCOPIC APPENDECTOMY  10/04/2011   Procedure: APPENDECTOMY LAPAROSCOPIC;  Surgeon: Rulon Abide, DO;  [facility];  Service: General;  Laterality: N/A;  . LEFT HEART CATH AND CORONARY ANGIOGRAPHY N/A 10/06/2019   Procedure: LEFT HEART CATH AND CORONARY ANGIOGRAPHY;  Surgeon: [place], Peter M, MD;  Location: [facility];  Service: Cardiovascular;  Laterality: N/A;  . RADIAL ARTERY HARVEST Left 10/24/2019   Procedure: Radial Artery Harvest;  Surgeon: Kerin Perna, MD;  Location: [facility];  Service: Open Heart Surgery;  Laterality: Left;  . TEE WITHOUT CARDIOVERSION N/A 10/24/2019   Procedure: TRANSESOPHAGEAL ECHOCARDIOGRAM (TEE);  Surgeon: Donata Clay, Theron Arista, MD;  Location: [facility];  Service: Open Heart Surgery;  Laterality: N/A;     Social History:  reports that she quit smoking about 3 years ago. Her smoking use included e-cigarettes and cigarettes. She has a 48.00 pack-year smoking history. She has never used smokeless tobacco. She reports current alcohol use. She reports that she does not use drugs. Family History:  Family History  Problem Relation Age of Onset  . Aneurysm Mother   . Pancreatic cancer Mother   . Heart attack Father   . Stroke Father   . Heart disease Sister   . Breast cancer Paternal Grandmother   . Congestive Heart Failure Sister   . Osteoarthritis Sister   . Osteoarthritis Sister       HOME MEDICATIONS: Allergies as of 05/16/2020      Reactions   Sulfa Antibiotics Hives   Codeine Itching      Medication List       Accurate as of May 16, 2020  7:20 AM. If you have any questions, ask your nurse or doctor.        acetaminophen 500 MG tablet Commonly known as: TYLENOL Take 1,000 mg by mouth every 6 (six) hours as needed for moderate pain or headache.   albuterol 108 (90 Base) MCG/ACT inhaler Commonly known as: VENTOLIN HFA Inhale 1-2 puffs into the lungs every 6 (six) hours as needed for wheezing or shortness of  breath.   aspirin 325 MG EC tablet Take 1 tablet (325 mg total) by mouth daily.   atorvastatin 80 MG tablet Commonly known as: LIPITOR TAKE 1 TABLET(80 MG) BY MOUTH DAILY AT 6 PM   azelastine 0.1 % nasal spray Commonly known as: ASTELIN Place 2 sprays into both nostrils 2 (two) times daily.   B-D UF III MINI PEN NEEDLES 31G X 5 MM Misc Generic drug: Insulin Pen Needle Use as instructed. Inject into the skin three times daily   cephALEXin 500 MG capsule Commonly known as: KEFLEX Take 1 capsule (500 mg total) by mouth 4 (four) times daily.   esomeprazole  20 MG capsule Commonly known as: NEXIUM Take 20 mg by mouth daily.   ezetimibe 10 MG tablet Commonly known as: ZETIA Take 1 tablet (10 mg total) by mouth daily.   fluconazole 150 MG tablet Commonly known as: Diflucan Take 1 tablet (150 mg total) by mouth daily. Take second dose 72 hours later if symptoms still persists.   FLUoxetine 40 MG capsule Commonly known as: PROZAC TAKE 1 CAPSULE(40 MG) BY MOUTH DAILY   fluticasone 50 MCG/ACT nasal spray Commonly known as: FLONASE Place 2 sprays into both nostrils daily as needed for allergies or rhinitis.   furosemide 20 MG tablet Commonly known as: LASIX Take 1 tablet (20 mg total) by mouth as needed (for ankle swelling).   gabapentin 300 MG capsule Commonly known as: NEURONTIN 3 caps PO  in the morning, 1 in the afternoon and 3 capsules at night   glucose blood test strip Commonly known as: True Metrix Blood Glucose Test Use as instructed   Jardiance 10 MG Tabs tablet Generic drug: empagliflozin TAKE 1 TABLET BY MOUTH DAILY BEFORE BREAKFAST   Levemir FlexTouch 100 UNIT/ML FlexPen Generic drug: insulin detemir Inject 37 Units into the skin 2 (two) times daily.   Melatonin 10 MG Caps Take 10 mg by mouth at bedtime as needed (sleep).   metFORMIN 500 MG 24 hr tablet Commonly known as: GLUCOPHAGE-XR Take 2 tablets (1,000 mg total) by mouth daily with breakfast.    metoprolol succinate 25 MG 24 hr tablet Commonly known as: Toprol XL Take 0.5 tablets (12.5 mg total) by mouth daily.   TRUEplus Safety Lancets 28G Misc Use as directed        OBJECTIVE:   Vital Signs: BP 116/64 (BP Location: Left Arm, Patient Position: Sitting, Cuff Size: Normal)   Pulse 73   Ht 5\' 9"  (1.753 m)   Wt 240 lb 12.8 oz (109.2 kg)   SpO2 96%   BMI 35.56 kg/m    Wt Readings from Last 3 Encounters:  04/22/20 237 lb 3.2 oz (107.6 kg)  04/17/20 239 lb (108.4 kg)  03/13/20 230 lb (104.3 kg)     Exam: General: Pt appears well and is in NAD  Neck: General: Supple without adenopathy. Thyroid: Thyroid size normal.  No goiter or nodules appreciated.  Lungs: Clear with good BS bilat with no rales, rhonchi, or wheezes  Heart: RRR with normal S1 and S2 and no gallops; no murmurs; no rub  Abdomen: Normoactive bowel sounds, soft, nontender, without masses or organomegaly palpable  Extremities: No pretibial edema.   Neuro: MS is good with appropriate affect, pt is alert and Ox3     DM foot exam: 11/16/2019  The skin of the feet is intact without sores or ulcerations. The pedal pulses are undetectable on today's exam  The sensation is decreased to a screening 5.07, 10 gram monofilament bilaterally   DATA REVIEWED:  Lab Results  Component Value Date   HGBA1C 15.1 (H) 10/20/2019   HGBA1C 12.2 (A) 06/05/2019   HGBA1C 8.2 (A) 09/29/2018   Lab Results  Component Value Date   MICROALBUR 0.96 06/02/2010   LDLCALC 78 10/02/2019   CREATININE 1.27 (H) 02/20/2020   Lab Results  Component Value Date   MICRALBCREAT 15 06/05/2019     Lab Results  Component Value Date   CHOL 164 10/02/2019   HDL 58 10/02/2019   LDLCALC 78 10/02/2019   TRIG 164 (H) 10/02/2019   CHOLHDL 2.8 10/02/2019  05/15/2020 BUN/Cr 30/1.35 GFR 41   ASSESSMENT / PLAN / RECOMMENDATIONS:   1) Type 2 Diabetes Mellitus, Poorly controlled, With Neuropathic, retinopathic and  macrovascular complications - Most recent A1c of 16.0 %. Goal A1c < 7.0 %.    - Poorly controlled diabetes due to medication non-adherence and dietary indiscretions  - She is intolerant to higher doses of Metformin, she is intolerant to GLP-1 agonists   - She admits to difficulty with keeping up with 4 infections a day, we went over the V-Go pump as this will not require her to count carbs. She is willing to give this a try.  - A referral has been initiated to our CDE for pump training. In the meantime will switch levemir to tresiba   MEDICATIONS: - STOP LEVEMIR - STOP VICTOZA   - Continue Metformin 500 mg XR , 2 tablet with breakfast  - Continue Jardiance 10 mg , 1 tablet daily with Breakfast  - Start Tresiba 40 units daily  - Novolog 10 units with each meal   EDUCATION / INSTRUCTIONS:  BG monitoring instructions: Patient is instructed to check her blood sugars 3 times a day, preprandial  Call [place] [facility] if: BG persistently < 70  . I reviewed the Rule of 15 for the treatment of hypoglycemia in detail with the patient. Literature supplied.  2) Diabetic complications:   Eye: Does  have known diabetic retinopathy.   Neuro/ Feet: Does have known diabetic peripheral neuropathy .   Renal: Patient does not have known baseline CKD. She   is not on an ACEI/ARB at present.      F/U in 3 months    Signed electronically by: Lyndle Herrlich, MD  [facility] Endocrinology  [facility] [address] [place], [place] [address] Phone: [phone] FAX: [phone]   CC: Marcine Matar, MD [address] [place] [address] Phone: [phone]  Fax: [phone]  Return to Endocrinology clinic as below: Future Appointments  Date Time Provider Department Center  05/16/2020  9:50 AM Sanjit Mcmichael, Konrad Dolores, MD [facility] None  05/24/2020 11:00 AM Pollyann Savoy, MD [facility] None  06/16/2020  9:00 AM Chales Abrahams,  NP [facility] None  07/30/2020  2:10 PM Marcine Matar, MD [facility] None  08/27/2020 10:00 AM Little Ishikawa, MD [facility]

## 2020-05-16 NOTE — Patient Instructions (Addendum)
-   STOP LEVEMIR - STOP VICTOZA   - Continue Metformin 500 mg XR , 2 tablet with breakfast  - Continue Jardiance 10 mg , 1 tablet daily with Breakfast  - Start Tresiba 40 units daily  - Novolog 10 units with each meal        HOW TO TREAT LOW BLOOD SUGARS (Blood sugar LESS THAN 70 MG/DL)  Please follow the RULE OF 15 for the treatment of hypoglycemia treatment (when your (blood sugars are less than 70 mg/dL)    STEP 1: Take 15 grams of carbohydrates when your blood sugar is low, which includes:   3-4 GLUCOSE TABS  OR  3-4 OZ OF JUICE OR REGULAR SODA OR  ONE TUBE OF GLUCOSE GEL     STEP 2: RECHECK blood sugar in 15 MINUTES STEP 3: If your blood sugar is still low at the 15 minute recheck --> then, go back to STEP 1 and treat AGAIN with another 15 grams of carbohydrates.

## 2020-05-17 ENCOUNTER — Ambulatory Visit: Payer: 59 | Admitting: Rheumatology

## 2020-05-24 ENCOUNTER — Ambulatory Visit: Payer: 59 | Admitting: Rheumatology

## 2020-05-27 ENCOUNTER — Encounter: Payer: Self-pay | Admitting: Rheumatology

## 2020-05-28 ENCOUNTER — Telehealth: Payer: Self-pay | Admitting: Internal Medicine

## 2020-05-28 NOTE — Telephone Encounter (Signed)
Copied from CRM [id number]. Topic: Medical Record Request - Provider/Facility Request >> May 21, 2020 12:06 PM Areatha Keas wrote: Patient Name/DOB/MRN #: [name] [birth year]/ mrn# [id number] Requestor Name/Agency: [name] physicians at [facility] (Dr. Cala Bradford)  Call Back #: [phone]  Fax# [phone] Information Requested: most recent Pap / cologard and mammogram    Route to [facility] HIM Pool for [place] clinics. For all other clinics, route to the clinic's PEC Pool.

## 2020-05-28 NOTE — Telephone Encounter (Signed)
Copied from CRM #[id number]. Topic: Medical Record Request - Provider/Facility Request >> May 21, 2020 12:06 PM Johnson, Chaz E wrote: Patient Name/DOB/MRN #: [name]/ 6.28.1972/ mrn# [id number] Requestor Name/Agency: [facility] (Dr. Kimberly)  Call Back #: [phone]  Fax# [phone] Information Requested: most recent Pap / cologard and mammogram    Route to [facility] HIM Pool for [place] clinics. For all other clinics, route to the clinic's PEC Pool. 

## 2020-05-28 NOTE — Telephone Encounter (Signed)
Copied from CRM #[id number]. Topic: Medical Record Request - Provider/Facility Request >> May 21, 2020 12:06 PM Johnson, Chaz E wrote: Patient Name/DOB/MRN #: [name]/ 6.28.1972/ mrn# [id number] Requestor Name/Agency: [facility] (Dr. Kimberly)  Call Back #: [phone]  Fax# [phone] Information Requested: most recent Pap / cologard and mammogram    Route to [facility] HIM Pool for [place] clinics. For all other clinics, route to the clinic's PEC Pool. 

## 2020-05-28 NOTE — Telephone Encounter (Signed)
Request for medical records.

## 2020-05-30 ENCOUNTER — Telehealth: Payer: Self-pay | Admitting: Dietician

## 2020-05-30 NOTE — Telephone Encounter (Signed)
Message from Dr. Lonzo Cloud received that patient can go ahead with the omnipod insulin pump if she carbohydrate count.  Called and spoke with patient who stated that she had started the paperwork and will follow up with [place]. Appointment made for 10/19 to review carbohydrate counting.  Asked for patient to call if she receives the Omnipod prior to that appointment.  Oran Rein, RD, LDN, CDCES

## 2020-05-30 NOTE — Telephone Encounter (Signed)
Returned patient call. Patient states that her insurance will not cover the V-go but will cover the Omnipod. Discussed this briefly and she is interested in the product. She states that she can relearn how to carb count. Message sent to Dr. Lonzo Cloud to give advice per patient request.  Oran Rein, RD, LDN, CDCES

## 2020-06-01 NOTE — Progress Notes (Signed)
Office Visit Note  Patient: [name]             Date of Birth: Jun 10, 1971           MRN: [id number]             PCP: Ladell Pier, MD Referring: Ladell Pier, MD Visit Date: 06/04/2020 Occupation: @GUAROCC @  Subjective:  Pain in multiple joints.   History of Present Illness: [name] is a 49 y.o. female with osteoarthritis.  She continues to have pain and discomfort in her left shoulder, bilateral hands, bilateral knee joints and her feet.  She states her hands have been very painful.  She has not noticed any recent swelling.  Activities of Daily Living:  Patient reports morning stiffness for 1-2 hours.   Patient Reports nocturnal pain.  Difficulty dressing/grooming: Reports Difficulty climbing stairs: Reports Difficulty getting out of chair: Reports Difficulty using hands for taps, buttons, cutlery, and/or writing: Reports  Review of Systems  Constitutional: Positive for fatigue.  HENT: Negative for mouth sores, mouth dryness and nose dryness.   Eyes: Negative for pain, itching and dryness.  Respiratory: Negative for cough, hemoptysis, shortness of breath and difficulty breathing.   Cardiovascular: Positive for swelling in legs/feet. Negative for chest pain and palpitations.  Gastrointestinal: Negative for abdominal pain, blood in stool, constipation and diarrhea.  Endocrine: Negative for increased urination.  Genitourinary: Negative for difficulty urinating.  Musculoskeletal: Positive for arthralgias, joint pain, joint swelling and morning stiffness. Negative for myalgias, muscle weakness, muscle tenderness and myalgias.  Skin: Negative for color change, rash and redness.  Allergic/Immunologic: Positive for susceptible to infections.  Neurological: Positive for weakness. Negative for dizziness, headaches and memory loss.  Hematological: Negative for swollen glands.  Psychiatric/Behavioral: Negative for confusion and sleep disturbance.    PMFS  History:  Patient Active Problem List   Diagnosis Date Noted  . Stage 3a chronic kidney disease ([place]) 04/29/2020  . COVID-19 virus vaccination declined 04/29/2020  . Influenza vaccination declined 04/29/2020  . Midline sternotomy scar 04/17/2020  . Left rib fracture 03/13/2020  . Moderate episode of recurrent major depressive disorder ([place]) 12/28/2019  . Encounter for post surgical wound check 12/06/2019  . Type 2 diabetes mellitus with diabetic polyneuropathy, with long-term current use of insulin ([place]) 11/16/2019  . Diabetes mellitus ([place]) 11/16/2019  . Type 2 diabetes mellitus with retinopathy, with long-term current use of insulin ([place]) 11/16/2019  . Dyslipidemia 11/16/2019  . Postop check 11/15/2019  . Diabetic retinopathy ([place]) 10/25/2019  . 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 Angina pectoris ([place]) 10/06/2019  . Left-sided chest pain 06/13/2019  . Post-operative state 06/06/2018  . HGSIL (high grade squamous intraepithelial lesion) on Pap smear of cervix 02/25/2018  . Right upper quadrant abdominal pain 12/30/2017  . Polyarthritis 12/30/2017  . OSA (obstructive sleep apnea) 11/22/2013  . Dyspnea 11/22/2013  . Uncontrolled type 2 diabetes mellitus with hyperosmolar nonketotic hyperglycemia ([place]) 07/21/2012  . HTN (hypertension) 07/21/2012  . Smoker 07/21/2012    Past Medical History:  Diagnosis Date  . Anxiety   . Arthritis    fingers  . Cervical dysplasia   . Coronary artery disease   . Depression   . Diabetes mellitus    Type II  . Dyspnea 11/22/2013  . GERD (gastroesophageal reflux disease)   . High cholesterol   . History of kidney stones    passed  . Hypertension    no meds now  . Kidney stones   .  Moderate episode of recurrent major depressive disorder ([place]) 12/28/2019  . Neuropathy    feet and legs  . Nuclear sclerotic cataract of both eyes 09/2019   Dr. Idolina Primer  . PONV (postoperative nausea and vomiting)   . Sleep apnea    lost 70lbs no cpap x60yr now  . Stage 3a  chronic kidney disease ([place] 04/29/2020    Family History  Problem Relation Age of Onset  . Aneurysm Mother   . Pancreatic cancer Mother   . Heart attack Father   . Stroke Father   . Heart disease Sister   . Breast cancer Paternal Grandmother   . Congestive Heart Failure Sister   . Osteoarthritis Sister   . Osteoarthritis Sister    Past Surgical History:  Procedure Laterality Date  . CARDIAC CATHETERIZATION     4-536yrago  . CERVICAL CONIZATION W/BX N/A 05/04/2018   Procedure: CONIZATION CERVIX WITH BIOPSY - COLD KNIFE;  Surgeon: ErChancy MilroyMD;  [place] [facility]: [place]necology;  Laterality: N/A;  . CHOLECYSTECTOMY    . CORONARY ARTERY BYPASS GRAFT N/A 10/24/2019   Procedure: CORONARY ARTERY BYPASS GRAFTING (CABG) times four, using left radial artery harvested endoscopically and right greater saphenous vein harvested endoscopically.;  Surgeon: VaIvin PootMD;  [place]: Open Heart Surgery;  Laterality: N/A;  . ENDOMETRIAL ABLATION     6 years ago  . INCISION AND DRAINAGE     for boil- buttocks  . INCISION AND DRAINAGE Left    Leg, Nephrotoxin fasciotimy  . IRRIGATION AND DEBRIDEMENT ABSCESS Right 03/17/2017   Procedure: IRRIGATION AND DEBRIDEMENT RIGHT THIGH ABSCESS;  Surgeon: GrMichael BostonMD;  Location: [facility];  Service: General;  Laterality: Right;  . LAPAROSCOPIC APPENDECTOMY  10/04/2011   Procedure: APPENDECTOMY LAPAROSCOPIC;  Surgeon: BrJudieth KeensDO;  Location: [facility];  Service: General;  Laterality: N/A;  . LEFT HEART CATH AND CORONARY ANGIOGRAPHY N/A 10/06/2019   Procedure: LEFT HEART CATH AND CORONARY ANGIOGRAPHY;  Surgeon: JoMartiniquePeter M, MD;  [facility];  Service: Cardiovascular;  Laterality: N/A;  . RADIAL ARTERY HARVEST Left 10/24/2019   Procedure: Radial Artery Harvest;  Surgeon: VaIvin PootMD;  [place]: Open [facility] Surgery;  Laterality: Left;  . TEE WITHOUT  CARDIOVERSION N/A 10/24/2019   Procedure: TRANSESOPHAGEAL ECHOCARDIOGRAM (TEE);  Surgeon: VaPrescott GumPeCollier SalinaMD;  [place]: Open [facility] Surgery;  Laterality: N/A;   Social History   Social History Narrative  . Not on file   Immunization History  Administered Date(s) Administered  . Influenza Split 05/31/2013  . Influenza,inj,Quad PF,6+ Mos 06/05/2019  . Pneumococcal Polysaccharide-23 03/29/2017  . Tdap 01/27/2018     Objective: Vital Signs: BP 112/75 (BP Location: Left Arm, Patient Position: Sitting, Cuff Size: Small)   Pulse 76   Ht 5' 9"  (1.753 m)   Wt 248 lb 3.2 oz (112.6 kg)   BMI 36.65 kg/m    Physical Exam Vitals and nursing note reviewed.  Constitutional:      Appearance: She is well-developed.  HENT:     Head: Normocephalic and atraumatic.  Eyes:     Conjunctiva/sclera: Conjunctivae normal.  Cardiovascular:     Rate and Rhythm: Normal rate and regular rhythm.     Heart sounds: Normal heart sounds.  Pulmonary:     Effort: Pulmonary effort is normal.     Breath sounds: Normal breath sounds.  Abdominal:  General: Bowel sounds are normal.     Palpations: Abdomen is soft.  Musculoskeletal:     Cervical back: Normal range of motion.  Lymphadenopathy:     Cervical: No cervical adenopathy.  Skin:    General: Skin is warm and dry.     Capillary Refill: Capillary refill takes less than 2 seconds.  Neurological:     Mental Status: She is alert and oriented to person, place, and time.  Psychiatric:        Behavior: Behavior normal.      Musculoskeletal Exam: C-spine was in good range of motion.  Shoulder joints and elbow joints in good range of motion.  She has DIP and PIP thickening.  She had tenderness across MCPs PIPs and DIPs but no synovitis was noted.  Hip joints, knee joints, ankles, MTPs and PIPs with good range of motion with no synovitis.  CDAI Exam: CDAI Score: -- Patient Global: --; Provider Global: -- Swollen: --; Tender: -- Joint  Exam 06/04/2020   No joint exam has been documented for this visit   There is currently no information documented on the homunculus. Go to the Rheumatology activity and complete the homunculus joint exam.  Investigation: No additional findings.  Imaging: No results found.  Recent Labs: Lab Results  Component Value Date   WBC 6.9 02/20/2020   HGB 15.8 02/20/2020   PLT 285 02/20/2020   NA 141 02/20/2020   K 4.2 02/20/2020   CL 99 02/20/2020   CO2 24 02/20/2020   GLUCOSE 127 (H) 02/20/2020   BUN 25 (H) 02/20/2020   CREATININE 1.27 (H) 02/20/2020   BILITOT 0.2 02/20/2020   ALKPHOS 126 (H) 02/20/2020   AST 20 02/20/2020   ALT 27 02/20/2020   PROT 7.0 02/20/2020   ALBUMIN 4.4 02/20/2020   CALCIUM 10.2 02/20/2020   GFRAA 57 (L) 02/20/2020   April 22, 2020 ESR 14, uric acid 5.7, RF negative, anti-CCP negative, HLA-B27 negative  Speciality Comments: No specialty comments available.  Procedures:  No procedures performed Allergies: Sulfa antibiotics and Codeine   Assessment / Plan:     Visit Diagnoses: Chronic left shoulder pain - X-ray was unremarkable at the last visit.  She had limited painful range of motion of her left shoulder.  I discussed physical therapy which she declined.  Have given her a handout on shoulder joint exercises.  Primary osteoarthritis of both hands - Clinical and radiographic findings are consistent with osteoarthritis.  She gives history of intermittent swelling.  All autoimmune work-up was negative.  I offered ultrasound of bilateral hands but she declined.  Have given her a handout on hand exercises.  I have advised her to contact me in case she develops any increased swelling.  Natural anti-inflammatories were discussed.  Chronic pain of left knee - History of intermittent swelling.  X-ray was unremarkable.  Handout on knee joint exercises was given.  Weight loss was discussed.  Primary osteoarthritis of both feet - X-rays were consistent with  osteoarthritis.  No synovitis was noted.  All autoimmune work-up was negative.  Essential hypertension-blood pressure is well controlled.  S/P CABG x 4  Dyslipidemia-weight loss diet and exercise was discussed.  Uncontrolled type 2 diabetes mellitus with hyperosmolar nonketotic hyperglycemia (HCC)  History of gastroesophageal reflux (GERD)  History of kidney stones  Nuclear sclerotic cataract of both eyes  Anxiety and depression  OSA (obstructive sleep apnea)  HGSIL (high grade squamous intraepithelial lesion) on Pap smear of cervix  Former smoker  Educated  about COVID-19 virus infection-patient does not want to be a vaccinated.  She understands risk of not having the vaccination.  Orders: No orders of the defined types were placed in this encounter.  No orders of the defined types were placed in this encounter.    Follow-Up Instructions: Return in about 1 year (around 06/04/2021) for Osteoarthritis.   Bo Merino, MD  Note - This record has been created using Editor, commissioning.  Chart creation errors have been sought, but may not always  have been located. Such creation errors do not reflect on  the standard of medical care.

## 2020-06-03 ENCOUNTER — Telehealth: Payer: Self-pay

## 2020-06-03 DIAGNOSIS — Z0000 Encounter for general adult medical examination without abnormal findings: Secondary | ICD-10-CM

## 2020-06-03 NOTE — Telephone Encounter (Signed)
Spoke with patient to determine if they wanted to continue health coaching since they no showed for their appt and did not reschedule since. Patient stated that she is seeing a nutritionist through her endocrinologist. Patient is no longer interested in continuing health coaching as of 06/03/20.

## 2020-06-04 ENCOUNTER — Other Ambulatory Visit: Payer: Self-pay

## 2020-06-04 ENCOUNTER — Encounter: Payer: Self-pay | Admitting: Rheumatology

## 2020-06-04 ENCOUNTER — Ambulatory Visit (INDEPENDENT_AMBULATORY_CARE_PROVIDER_SITE_OTHER): Payer: 59 | Admitting: Rheumatology

## 2020-06-04 VITALS — BP 112/75 | HR 76 | Ht 69.0 in | Wt 248.2 lb

## 2020-06-04 DIAGNOSIS — Z87442 Personal history of urinary calculi: Secondary | ICD-10-CM

## 2020-06-04 DIAGNOSIS — M19071 Primary osteoarthritis, right ankle and foot: Secondary | ICD-10-CM

## 2020-06-04 DIAGNOSIS — E785 Hyperlipidemia, unspecified: Secondary | ICD-10-CM

## 2020-06-04 DIAGNOSIS — M19041 Primary osteoarthritis, right hand: Secondary | ICD-10-CM | POA: Diagnosis not present

## 2020-06-04 DIAGNOSIS — M19042 Primary osteoarthritis, left hand: Secondary | ICD-10-CM

## 2020-06-04 DIAGNOSIS — M25562 Pain in left knee: Secondary | ICD-10-CM | POA: Diagnosis not present

## 2020-06-04 DIAGNOSIS — G4733 Obstructive sleep apnea (adult) (pediatric): Secondary | ICD-10-CM

## 2020-06-04 DIAGNOSIS — E1100 Type 2 diabetes mellitus with hyperosmolarity without nonketotic hyperglycemic-hyperosmolar coma (NKHHC): Secondary | ICD-10-CM

## 2020-06-04 DIAGNOSIS — F32A Depression, unspecified: Secondary | ICD-10-CM

## 2020-06-04 DIAGNOSIS — M25512 Pain in left shoulder: Secondary | ICD-10-CM

## 2020-06-04 DIAGNOSIS — Z87891 Personal history of nicotine dependence: Secondary | ICD-10-CM

## 2020-06-04 DIAGNOSIS — Z951 Presence of aortocoronary bypass graft: Secondary | ICD-10-CM

## 2020-06-04 DIAGNOSIS — H2513 Age-related nuclear cataract, bilateral: Secondary | ICD-10-CM

## 2020-06-04 DIAGNOSIS — I10 Essential (primary) hypertension: Secondary | ICD-10-CM

## 2020-06-04 DIAGNOSIS — Z8719 Personal history of other diseases of the digestive system: Secondary | ICD-10-CM

## 2020-06-04 DIAGNOSIS — G8929 Other chronic pain: Secondary | ICD-10-CM

## 2020-06-04 DIAGNOSIS — M19072 Primary osteoarthritis, left ankle and foot: Secondary | ICD-10-CM

## 2020-06-04 DIAGNOSIS — F419 Anxiety disorder, unspecified: Secondary | ICD-10-CM

## 2020-06-04 DIAGNOSIS — Z7189 Other specified counseling: Secondary | ICD-10-CM

## 2020-06-04 DIAGNOSIS — R87613 High grade squamous intraepithelial lesion on cytologic smear of cervix (HGSIL): Secondary | ICD-10-CM

## 2020-06-04 NOTE — Patient Instructions (Signed)
Journal for Nurse Practitioners, 15(4), 263-267. Retrieved June 06, 2018 from [email or web address]">  Knee Exercises Ask your health care provider which exercises are safe for you. Do exercises exactly as told by your health care provider and adjust them as directed. It is normal to feel mild stretching, pulling, tightness, or discomfort as you do these exercises. Stop right away if you feel sudden pain or your pain gets worse. Do not begin these exercises until told by your health care provider. Stretching and range-of-motion exercises These exercises warm up your muscles and joints and improve the movement and flexibility of your knee. These exercises also help to relieve pain and swelling. Knee extension, prone 1. Lie on your abdomen (prone position) on a bed. 2. Place your left / right knee just beyond the edge of the surface so your knee is not on the bed. You can put a towel under your left / right thigh just above your kneecap for comfort. 3. Relax your leg muscles and allow gravity to straighten your knee (extension). You should feel a stretch behind your left / right knee. 4. Hold this position for __________ seconds. 5. Scoot up so your knee is supported between repetitions. Repeat __________ times. Complete this exercise __________ times a day. Knee flexion, active  1. Lie on your back with both legs straight. If this causes back discomfort, bend your left / right knee so your foot is flat on the floor. 2. Slowly slide your left / right heel back toward your buttocks. Stop when you feel a gentle stretch in the front of your knee or thigh (flexion). 3. Hold this position for __________ seconds. 4. Slowly slide your left / right heel back to the starting position. Repeat __________ times. Complete this exercise __________ times a day. Quadriceps stretch, prone  1. Lie on your abdomen on a firm surface, such as a bed or padded floor. 2. Bend your left / right knee and hold  your ankle. If you cannot reach your ankle or pant leg, loop a belt around your foot and grab the belt instead. 3. Gently pull your heel toward your buttocks. Your knee should not slide out to the side. You should feel a stretch in the front of your thigh and knee (quadriceps). 4. Hold this position for __________ seconds. Repeat __________ times. Complete this exercise __________ times a day. Hamstring, supine 1. Lie on your back (supine position). 2. Loop a belt or towel over the ball of your left / right foot. The ball of your foot is on the walking surface, right under your toes. 3. Straighten your left / right knee and slowly pull on the belt to raise your leg until you feel a gentle stretch behind your knee (hamstring). ? Do not let your knee bend while you do this. ? Keep your other leg flat on the floor. 4. Hold this position for __________ seconds. Repeat __________ times. Complete this exercise __________ times a day. Strengthening exercises These exercises build strength and endurance in your knee. Endurance is the ability to use your muscles for a long time, even after they get tired. Quadriceps, isometric This exercise stretches the muscles in front of your thigh (quadriceps) without moving your knee joint (isometric). 1. Lie on your back with your left / right leg extended and your other knee bent. Put a rolled towel or small pillow under your knee if told by your health care provider. 2. Slowly tense the muscles in the front of your left /   right thigh. You should see your kneecap slide up toward your hip or see increased dimpling just above the knee. This motion will push the back of the knee toward the floor. 3. For __________ seconds, hold the muscle as tight as you can without increasing your pain. 4. Relax the muscles slowly and completely. Repeat __________ times. Complete this exercise __________ times a day. Straight leg raises This exercise stretches the muscles in front  of your thigh (quadriceps) and the muscles that move your hips (hip flexors). 1. Lie on your back with your left / right leg extended and your other knee bent. 2. Tense the muscles in the front of your left / right thigh. You should see your kneecap slide up or see increased dimpling just above the knee. Your thigh may even shake a bit. 3. Keep these muscles tight as you raise your leg 4-6 inches (10-15 cm) off the floor. Do not let your knee bend. 4. Hold this position for __________ seconds. 5. Keep these muscles tense as you lower your leg. 6. Relax your muscles slowly and completely after each repetition. Repeat __________ times. Complete this exercise __________ times a day. Hamstring, isometric 1. Lie on your back on a firm surface. 2. Bend your left / right knee about __________ degrees. 3. Dig your left / right heel into the surface as if you are trying to pull it toward your buttocks. Tighten the muscles in the back of your thighs (hamstring) to "dig" as hard as you can without increasing any pain. 4. Hold this position for __________ seconds. 5. Release the tension gradually and allow your muscles to relax completely for __________ seconds after each repetition. Repeat __________ times. Complete this exercise __________ times a day. Hamstring curls If told by your health care provider, do this exercise while wearing ankle weights. Begin with __________ lb weights. Then increase the weight by 1 lb (0.5 kg) increments. Do not wear ankle weights that are more than __________ lb. 1. Lie on your abdomen with your legs straight. 2. Bend your left / right knee as far as you can without feeling pain. Keep your hips flat against the floor. 3. Hold this position for __________ seconds. 4. Slowly lower your leg to the starting position. Repeat __________ times. Complete this exercise __________ times a day. Squats This exercise strengthens the muscles in front of your thigh and knee  (quadriceps). 1. Stand in front of a table, with your feet and knees pointing straight ahead. You may rest your hands on the table for balance but not for support. 2. Slowly bend your knees and lower your hips like you are going to sit in a chair. ? Keep your weight over your heels, not over your toes. ? Keep your lower legs upright so they are parallel with the table legs. ? Do not let your hips go lower than your knees. ? Do not bend lower than told by your health care provider. ? If your knee pain increases, do not bend as low. 3. Hold the squat position for __________ seconds. 4. Slowly push with your legs to return to standing. Do not use your hands to pull yourself to standing. Repeat __________ times. Complete this exercise __________ times a day. Wall slides This exercise strengthens the muscles in front of your thigh and knee (quadriceps). 1. Lean your back against a smooth wall or door, and walk your feet out 18-24 inches (46-61 cm) from it. 2. Place your feet hip-width apart. 3.   Slowly slide down the wall or door until your knees bend __________ degrees. Keep your knees over your heels, not over your toes. Keep your knees in line with your hips. 4. Hold this position for __________ seconds. Repeat __________ times. Complete this exercise __________ times a day. Straight leg raises This exercise strengthens the muscles that rotate the leg at the hip and move it away from your body (hip abductors). 1. Lie on your side with your left / right leg in the top position. Lie so your head, shoulder, knee, and hip line up. You may bend your bottom knee to help you keep your balance. 2. Roll your hips slightly forward so your hips are stacked directly over each other and your left / right knee is facing forward. 3. Leading with your heel, lift your top leg 4-6 inches (10-15 cm). You should feel the muscles in your outer hip lifting. ? Do not let your foot drift forward. ? Do not let your knee  roll toward the ceiling. 4. Hold this position for __________ seconds. 5. Slowly return your leg to the starting position. 6. Let your muscles relax completely after each repetition. Repeat __________ times. Complete this exercise __________ times a day. Straight leg raises This exercise stretches the muscles that move your hips away from the front of the pelvis (hip extensors). 1. Lie on your abdomen on a firm surface. You can put a pillow under your hips if that is more comfortable. 2. Tense the muscles in your buttocks and lift your left / right leg about 4-6 inches (10-15 cm). Keep your knee straight as you lift your leg. 3. Hold this position for __________ seconds. 4. Slowly lower your leg to the starting position. 5. Let your leg relax completely after each repetition. Repeat __________ times. Complete this exercise __________ times a day. This information is not intended to replace advice given to you by your health care provider. Make sure you discuss any questions you have with your health care provider. Document Revised: 06/07/2018 Document Reviewed: 06/07/2018 [employer] Patient Education  2020 [employer] Hand Exercises Hand exercises can be helpful for almost anyone. These exercises can strengthen the hands, improve flexibility and movement, and increase blood flow to the hands. These results can make work and daily tasks easier. Hand exercises can be especially helpful for people who have joint pain from arthritis or have nerve damage from overuse (carpal tunnel syndrome). These exercises can also help people who have injured a hand. Exercises Most of these hand exercises are gentle stretching and motion exercises. It is usually safe to do them often throughout the day. Warming up your hands before exercise may help to reduce stiffness. You can do this with gentle massage or by placing your hands in warm water for 10-15 minutes. It is normal to feel some stretching, pulling,  tightness, or mild discomfort as you begin new exercises. This will gradually improve. Stop an exercise right away if you feel sudden, severe pain or your pain gets worse. Ask your health care provider which exercises are best for you. Knuckle bend or "claw" fist 1. Stand or sit with your arm, hand, and all five fingers pointed straight up. Make sure to keep your wrist straight during the exercise. 2. Gently bend your fingers down toward your palm until the tips of your fingers are touching the top of your palm. Keep your big knuckle straight and just bend the small knuckles in your fingers. 3. Hold this position for   __________ seconds. 4. Straighten (extend) your fingers back to the starting position. Repeat this exercise 5-10 times with each hand. Full finger fist 1. Stand or sit with your arm, hand, and all five fingers pointed straight up. Make sure to keep your wrist straight during the exercise. 2. Gently bend your fingers into your palm until the tips of your fingers are touching the middle of your palm. 3. Hold this position for __________ seconds. 4. Extend your fingers back to the starting position, stretching every joint fully. Repeat this exercise 5-10 times with each hand. Straight fist 1. Stand or sit with your arm, hand, and all five fingers pointed straight up. Make sure to keep your wrist straight during the exercise. 2. Gently bend your fingers at the big knuckle, where your fingers meet your hand, and the middle knuckle. Keep the knuckle at the tips of your fingers straight and try to touch the bottom of your palm. 3. Hold this position for __________ seconds. 4. Extend your fingers back to the starting position, stretching every joint fully. Repeat this exercise 5-10 times with each hand. Tabletop 1. Stand or sit with your arm, hand, and all five fingers pointed straight up. Make sure to keep your wrist straight during the exercise. 2. Gently bend your fingers at the big  knuckle, where your fingers meet your hand, as far down as you can while keeping the small knuckles in your fingers straight. Think of forming a tabletop with your fingers. 3. Hold this position for __________ seconds. 4. Extend your fingers back to the starting position, stretching every joint fully. Repeat this exercise 5-10 times with each hand. Finger spread 1. Place your hand flat on a table with your palm facing down. Make sure your wrist stays straight as you do this exercise. 2. Spread your fingers and thumb apart from each other as far as you can until you feel a gentle stretch. Hold this position for __________ seconds. 3. Bring your fingers and thumb tight together again. Hold this position for __________ seconds. Repeat this exercise 5-10 times with each hand. Making circles 1. Stand or sit with your arm, hand, and all five fingers pointed straight up. Make sure to keep your wrist straight during the exercise. 2. Make a circle by touching the tip of your thumb to the tip of your index finger. 3. Hold for __________ seconds. Then open your hand wide. 4. Repeat this motion with your thumb and each finger on your hand. Repeat this exercise 5-10 times with each hand. Thumb motion 1. Sit with your forearm resting on a table and your wrist straight. Your thumb should be facing up toward the ceiling. Keep your fingers relaxed as you move your thumb. 2. Lift your thumb up as high as you can toward the ceiling. Hold for __________ seconds. 3. Bend your thumb across your palm as far as you can, reaching the tip of your thumb for the small finger (pinkie) side of your palm. Hold for __________ seconds. Repeat this exercise 5-10 times with each hand. Grip strengthening  1. Hold a stress ball or other soft ball in the middle of your hand. 2. Slowly increase the pressure, squeezing the ball as much as you can without causing pain. Think of bringing the tips of your fingers into the middle of  your palm. All of your finger joints should bend when doing this exercise. 3. Hold your squeeze for __________ seconds, then relax. Repeat this exercise 5-10 times with each   hand. Contact a health care provider if:  Your hand pain or discomfort gets much worse when you do an exercise.  Your hand pain or discomfort does not improve within 2 hours after you exercise. If you have any of these problems, stop doing these exercises right away. Do not do them again unless your health care provider says that you can. Get help right away if:  You develop sudden, severe hand pain or swelling. If this happens, stop doing these exercises right away. Do not do them again unless your health care provider says that you can. This information is not intended to replace advice given to you by your health care provider. Make sure you discuss any questions you have with your health care provider. Document Revised: 12/08/2018 Document Reviewed: 08/18/2018 [employer] Patient Education  2020 [employer] Shoulder Exercises Ask your health care provider which exercises are safe for you. Do exercises exactly as told by your health care provider and adjust them as directed. It is normal to feel mild stretching, pulling, tightness, or discomfort as you do these exercises. Stop right away if you feel sudden pain or your pain gets worse. Do not begin these exercises until told by your health care provider. Stretching exercises External rotation and abduction This exercise is sometimes called corner stretch. This exercise rotates your arm outward (external rotation) and moves your arm out from your body (abduction). 6. Stand in a doorway with one of your feet slightly in front of the other. This is called a staggered stance. If you cannot reach your forearms to the door frame, stand facing a corner of a room. 7. Choose one of the following positions as told by your health care provider: ? Place your hands and forearms on  the door frame above your head. ? Place your hands and forearms on the door frame at the height of your head. ? Place your hands on the door frame at the height of your elbows. 8. Slowly move your weight onto your front foot until you feel a stretch across your chest and in the front of your shoulders. Keep your head and chest upright and keep your abdominal muscles tight. 9. Hold for __________ seconds. 10. To release the stretch, shift your weight to your back foot. Repeat __________ times. Complete this exercise __________ times a day. Extension, standing 5. Stand and hold a broomstick, a cane, or a similar object behind your back. ? Your hands should be a little wider than shoulder width apart. ? Your palms should face away from your back. 6. Keeping your elbows straight and your shoulder muscles relaxed, move the stick away from your body until you feel a stretch in your shoulders (extension). ? Avoid shrugging your shoulders while you move the stick. Keep your shoulder blades tucked down toward the middle of your back. 7. Hold for __________ seconds. 8. Slowly return to the starting position. Repeat __________ times. Complete this exercise __________ times a day. Range-of-motion exercises Pendulum  5. Stand near a wall or a surface that you can hold onto for balance. 6. Bend at the waist and let your left / right arm hang straight down. Use your other arm to support you. Keep your back straight and do not lock your knees. 7. Relax your left / right arm and shoulder muscles, and move your hips and your trunk so your left / right arm swings freely. Your arm should swing because of the motion of your body, not because you are  using your arm or shoulder muscles. 8. Keep moving your hips and trunk so your arm swings in the following directions, as told by your health care provider: ? Side to side. ? Forward and backward. ? In clockwise and counterclockwise circles. 9. Continue each motion  for __________ seconds, or for as long as told by your health care provider. 10. Slowly return to the starting position. Repeat __________ times. Complete this exercise __________ times a day. Shoulder flexion, standing  5. Stand and hold a broomstick, a cane, or a similar object. Place your hands a little more than shoulder width apart on the object. Your left / right hand should be palm up, and your other hand should be palm down. 6. Keep your elbow straight and your shoulder muscles relaxed. Push the stick up with your healthy arm to raise your left / right arm in front of your body, and then over your head until you feel a stretch in your shoulder (flexion). ? Avoid shrugging your shoulder while you raise your arm. Keep your shoulder blade tucked down toward the middle of your back. 7. Hold for __________ seconds. 8. Slowly return to the starting position. Repeat __________ times. Complete this exercise __________ times a day. Shoulder abduction, standing 5. Stand and hold a broomstick, a cane, or a similar object. Place your hands a little more than shoulder width apart on the object. Your left / right hand should be palm up, and your other hand should be palm down. 6. Keep your elbow straight and your shoulder muscles relaxed. Push the object across your body toward your left / right side. Raise your left / right arm to the side of your body (abduction) until you feel a stretch in your shoulder. ? Do not raise your arm above shoulder height unless your health care provider tells you to do that. ? If directed, raise your arm over your head. ? Avoid shrugging your shoulder while you raise your arm. Keep your shoulder blade tucked down toward the middle of your back. 7. Hold for __________ seconds. 8. Slowly return to the starting position. Repeat __________ times. Complete this exercise __________ times a day. Internal rotation  7. Place your left / right hand behind your back, palm  up. 8. Use your other hand to dangle an exercise band, a towel, or a similar object over your shoulder. Grasp the band with your left / right hand so you are holding on to both ends. 9. Gently pull up on the band until you feel a stretch in the front of your left / right shoulder. The movement of your arm toward the center of your body is called internal rotation. ? Avoid shrugging your shoulder while you raise your arm. Keep your shoulder blade tucked down toward the middle of your back. 10. Hold for __________ seconds. 11. Release the stretch by letting go of the band and lowering your hands. Repeat __________ times. Complete this exercise __________ times a day. Strengthening exercises External rotation  6. Sit in a stable chair without armrests. 7. Secure an exercise band to a stable object at elbow height on your left / right side. 8. Place a soft object, such as a folded towel or a small pillow, between your left / right upper arm and your body to move your elbow about 4 inches (10 cm) away from your side. 9. Hold the end of the exercise band so it is tight and there is no slack. 10. Keeping your elbow  pressed against the soft object, slowly move your forearm out, away from your abdomen (external rotation). Keep your body steady so only your forearm moves. 11. Hold for __________ seconds. 12. Slowly return to the starting position. Repeat __________ times. Complete this exercise __________ times a day. Shoulder abduction  5. Sit in a stable chair without armrests, or stand up. 6. Hold a __________ weight in your left / right hand, or hold an exercise band with both hands. 7. Start with your arms straight down and your left / right palm facing in, toward your body. 8. Slowly lift your left / right hand out to your side (abduction). Do not lift your hand above shoulder height unless your health care provider tells you that this is safe. ? Keep your arms straight. ? Avoid shrugging your  shoulder while you do this movement. Keep your shoulder blade tucked down toward the middle of your back. 9. Hold for __________ seconds. 10. Slowly lower your arm, and return to the starting position. Repeat __________ times. Complete this exercise __________ times a day. Shoulder extension 5. Sit in a stable chair without armrests, or stand up. 6. Secure an exercise band to a stable object in front of you so it is at shoulder height. 7. Hold one end of the exercise band in each hand. Your palms should face each other. 8. Straighten your elbows and lift your hands up to shoulder height. 9. Step back, away from the secured end of the exercise band, until the band is tight and there is no slack. 10. Squeeze your shoulder blades together as you pull your hands down to the sides of your thighs (extension). Stop when your hands are straight down by your sides. Do not let your hands go behind your body. 11. Hold for __________ seconds. 12. Slowly return to the starting position. Repeat __________ times. Complete this exercise __________ times a day. Shoulder row 5. Sit in a stable chair without armrests, or stand up. 6. Secure an exercise band to a stable object in front of you so it is at waist height. 7. Hold one end of the exercise band in each hand. Position your palms so that your thumbs are facing the ceiling (neutral position). 8. Bend each of your elbows to a 90-degree angle (right angle) and keep your upper arms at your sides. 9. Step back until the band is tight and there is no slack. 10. Slowly pull your elbows back behind you. 11. Hold for __________ seconds. 12. Slowly return to the starting position. Repeat __________ times. Complete this exercise __________ times a day. Shoulder press-ups  7. Sit in a stable chair that has armrests. Sit upright, with your feet flat on the floor. 8. Put your hands on the armrests so your elbows are bent and your fingers are pointing forward. Your  hands should be about even with the sides of your body. 9. Push down on the armrests and use your arms to lift yourself off the chair. Straighten your elbows and lift yourself up as much as you comfortably can. ? Move your shoulder blades down, and avoid letting your shoulders move up toward your ears. ? Keep your feet on the ground. As you get stronger, your feet should support less of your body weight as you lift yourself up. 10. Hold for __________ seconds. 11. Slowly lower yourself back into the chair. Repeat __________ times. Complete this exercise __________ times a day. Wall push-ups  6. Stand so you are facing  a stable wall. Your feet should be about one arm-length away from the wall. 7. Lean forward and place your palms on the wall at shoulder height. 8. Keep your feet flat on the floor as you bend your elbows and lean forward toward the wall. 9. Hold for __________ seconds. 10. Straighten your elbows to push yourself back to the starting position. Repeat __________ times. Complete this exercise __________ times a day. This information is not intended to replace advice given to you by your health care provider. Make sure you discuss any questions you have with your health care provider. Document Revised: 12/09/2018 Document Reviewed: 09/16/2018 Elsevier Patient Education  2020 [employer].

## 2020-06-10 ENCOUNTER — Other Ambulatory Visit: Payer: Self-pay | Admitting: Family Medicine

## 2020-06-10 ENCOUNTER — Other Ambulatory Visit (HOSPITAL_COMMUNITY)
Admission: RE | Admit: 2020-06-10 | Discharge: 2020-06-10 | Disposition: A | Discharge status: Home / Self Care | Payer: 59 | Source: Ambulatory Visit | Attending: Family Medicine | Admitting: Family Medicine

## 2020-06-10 DIAGNOSIS — Z01411 Encounter for gynecological examination (general) (routine) with abnormal findings: Secondary | ICD-10-CM | POA: Diagnosis present

## 2020-06-11 LAB — CYTOLOGY - PAP
Comment: NEGATIVE
Diagnosis: NEGATIVE
High risk HPV: NEGATIVE

## 2020-06-12 ENCOUNTER — Telehealth: Payer: Self-pay | Admitting: *Deleted

## 2020-06-12 NOTE — Telephone Encounter (Signed)
Copied from CRM [id number]. Topic: Medical Record Request - Provider/Facility Request >> May 21, 2020 12:06 PM Areatha Keas wrote: Patient Name/DOB/MRN #: [name] October 04, 1970/ mrn# [id number] Requestor Name/Agency: [facility] at [facility] (Dr. Cala Bradford)  Call Back #: [phone]  Fax# [phone] Information Requested: most recent Pap / cologard and mammogram    Route to [facility] HIM Pool for [place] clinics. For all other clinics, route to the clinic's PEC Pool.

## 2020-06-16 ENCOUNTER — Other Ambulatory Visit: Payer: Self-pay | Admitting: Cardiothoracic Surgery

## 2020-06-16 ENCOUNTER — Telehealth (HOSPITAL_COMMUNITY): Payer: 59 | Admitting: Adult Health

## 2020-06-18 ENCOUNTER — Other Ambulatory Visit: Payer: Self-pay | Admitting: Internal Medicine

## 2020-06-18 ENCOUNTER — Encounter: Payer: Self-pay | Admitting: Dietician

## 2020-06-18 ENCOUNTER — Encounter: Payer: 59 | Attending: Internal Medicine | Admitting: Dietician

## 2020-06-18 ENCOUNTER — Other Ambulatory Visit: Payer: Self-pay

## 2020-06-18 DIAGNOSIS — Z794 Long term (current) use of insulin: Secondary | ICD-10-CM | POA: Insufficient documentation

## 2020-06-18 DIAGNOSIS — E1159 Type 2 diabetes mellitus with other circulatory complications: Secondary | ICD-10-CM | POA: Diagnosis not present

## 2020-06-18 NOTE — Progress Notes (Signed)
Diabetes Self-Management Education  Visit Type: Follow-up  Appt. Start Time: 1600 Appt. End Time: 1730  06/18/2020  [name], identified by name and date of birth, is a 49 y.o. female with a diagnosis of Diabetes: Type 2.   ASSESSMENT Patient is here today alone.  She was last seen by myself on 11/16/2019. Today, she would like more information regarding the Omnipod Insulin Pump and to discuss carbohydrate counting. Insurance did not cover the V-go. She is a patient of Dr. Lonzo Deleon.  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 Patient reports her latest A1C was 16% 05/2020 at Dr. Chanetta Deleon.  This is increased from 15.1% 10/20/2019. She states that A1C is elevated as she forgets to take her insulin at times for up to 3 days in a row and eats without discression. Medications include Tresiba 40 units q am, Novolog 10 units tid before meals (only eats 1-2 meals per day and misses the Novolog routinely). Levemir insulin was changed, Victoza was discontinued due to side effects (gas). Fasting blood glucose 190-210, post meal 130-450.  She is now checking her blood sugar up to 3 times daily.  She asked about CGM options and plans to reach out to her insurance company to see if this is covered. Messaged her endocrinologist for advice regarding insulin.  Showed patient the Omnipod Insulin Pump system and gave her an empty Preview Pod.  She is to call the company to follow up on progress. Discussed CGM options.  Weight hx: 246 lbs 05/2020 234 lbs 10/2019 She lost 100 lbs in the past and is frustrated as she is gaining this back. Lost the weight by skipping insulin.  Patient lives with her 34 yo son who is developmentally delayed and does not talk.  He has Pica.  She works for [employer]. Her husband died in 12-27-02 at the age of 5 of a massive heart attack. She tries to avoid salt. Walks small amounts.   Eating only 1-3 meals per day and 0-1 snacks daily. Discussed plant based eating for prevention of further progression of her disease but she did not feel ready for this change.    Diabetes Self-Management Education - 06/18/20 1909      Visit Information   Visit Type Follow-up      Initial Visit   Diabetes Type Type 2    Are you currently following a meal plan? No    Are you taking your medications as prescribed? No      Psychosocial Assessment   Patient Belief/Attitude about Diabetes Defeat/Burnout      Pre-Education Assessment   Patient understands the diabetes disease and treatment process. Demonstrates understanding / competency    Patient understands incorporating nutritional management into lifestyle. Needs Review    Patient undertands incorporating physical activity into lifestyle. Needs Review    Patient understands using medications safely. Needs Review    Patient understands monitoring blood glucose, interpreting and using results Needs Review    Patient understands prevention, detection, and treatment of acute complications. Demonstrates understanding / competency    Patient understands prevention, detection, and treatment of chronic complications. Demonstrates understanding / competency    Patient understands how to develop strategies to address psychosocial issues. Needs Review    Patient understands how to develop strategies to promote health/change behavior. Needs Review      Complications   Last HgB A1C per patient/outside source 16 %  How often do you check your blood sugar? 3-4 times/day    Fasting Blood glucose range (mg/dL) >354    Postprandial Blood glucose range (mg/dL) 65-681;275-170;017-494;>496   up to 450   Number of hypoglycemic episodes per month 0      Dietary Intake   Breakfast "no time"    Lunch handful of mixed nuts, string cheese, coffee with sugar free creamer    Dinner salmon, honey glazed carrots, rice    Beverage(s) water, coffee with  sugar free creamer      Patient Education   Previous Diabetes Education Yes (please comment)   10/2019   Nutrition management  Carbohydrate counting    Medications Taught/reviewed insulin injection, site rotation, insulin storage and needle disposal.    Monitoring Other (comment)   CGM options   Chronic complications Relationship between chronic complications and blood glucose control    Psychosocial adjustment Worked with patient to identify barriers to care and solutions      Individualized Goals (developed by patient)   Nutrition Follow meal plan discussed;Other (comment)   carbohydrate counting   Monitoring  test my blood glucose as discussed    Reducing Risk increase portions of healthy fats;Other (comment)   regular meal schedule     Patient Self-Evaluation of Goals - Patient rates self as meeting previously set goals (% of time)   Nutrition 25 - 50%    Physical Activity 25 - 50%    Medications 50 - 75 %    Monitoring 50 - 75 %    Problem Solving 25 - 50%    Reducing Risk 25 - 50%    Health Coping >75%      Post-Education Assessment   Patient understands the diabetes disease and treatment process. Demonstrates understanding / competency    Patient understands incorporating nutritional management into lifestyle. Needs Review    Patient undertands incorporating physical activity into lifestyle. Needs Review    Patient understands using medications safely. Needs Review    Patient understands monitoring blood glucose, interpreting and using results Needs Review    Patient understands prevention, detection, and treatment of acute complications. Demonstrates understanding / competency    Patient understands prevention, detection, and treatment of chronic complications. Demonstrates understanding / competency    Patient understands how to develop strategies to address psychosocial issues. Needs Review    Patient understands how to develop strategies to promote health/change behavior.  Needs Review      Outcomes   Expected Outcomes Demonstrated interest in learning. Expect positive outcomes    Future DMSE Other (comment)   when Pump arrives, for CGM, prn for nutrition   Program Status Completed      Subsequent Visit   Since your last visit have you continued or begun to take your medications as prescribed? No    Since your last visit have you experienced any weight changes? Gain    Since your last visit, are you checking your blood glucose at least once a day? Yes           Individualized Plan for Diabetes Self-Management Training:   Learning Objective:  Patient will have a greater understanding of diabetes self-management. Patient education plan is to attend individual and/or group sessions per assessed needs and concerns.   Plan:   Patient Instructions  Call the Omnipod company to see how your application for the Omnipod is progressing.  Call us for an appointment to train you on the Omnipod when you receive it.  Consider simple  meal options to take with you to work.  [place] sandwich and fruit  Prepared salad and fruit  LUVO prepared frozen meal and fruit  Whole grain crackers, raw vegetables, 1 oz cheese, 1 oz [place], fruit  Leftovers  Practice carbohydrate counting.  Bring your insulin pen to work. Remember to give your Novolog prior to every meal Aim for 30-50 grams of carbohydrates per meal.  Call to see if your insurance covers a Continuous Glucose Monitor (CGM)  Dexcom G6  FreeStyle Libre   Expected Outcomes:  Demonstrated interest in learning. Expect positive outcomes  Education material provided: Food label handouts and Meal plan card  If problems or questions, patient to contact team via:  Phone and Email  Future DSME appointment: Other (comment) (when Pump arrives, for CGM, prn for nutrition)

## 2020-06-18 NOTE — Patient Instructions (Signed)
Call the Omnipod company to see how your application for the Omnipod is progressing.  Call us for an appointment to train you on the Omnipod when you receive it.  Consider simple meal options to take with you to work.  [place] sandwich and fruit  Prepared salad and fruit  LUVO prepared frozen meal and fruit  Whole grain crackers, raw vegetables, 1 oz cheese, 1 oz [place], fruit  Leftovers  Practice carbohydrate counting.  Bring your insulin pen to work. Remember to give your Novolog prior to every meal Aim for 30-50 grams of carbohydrates per meal.  Call to see if your insurance covers a Continuous Glucose Monitor (CGM)  Dexcom G6  FreeStyle [place]

## 2020-06-20 ENCOUNTER — Telehealth: Payer: Self-pay | Admitting: Internal Medicine

## 2020-06-20 ENCOUNTER — Telehealth: Payer: Self-pay | Admitting: Dietician

## 2020-06-20 MED ORDER — DEXCOM G6 SENSOR MISC: 1.0000 | 11 refills | Status: DC

## 2020-06-20 MED ORDER — OMNIPOD 10 PACK MISC: 1.0000 | 6 refills | Status: DC

## 2020-06-20 MED ORDER — DEXCOM G6 TRANSMITTER MISC: 1.0000 | 3 refills | Status: DC

## 2020-06-20 NOTE — Telephone Encounter (Signed)
[facility] called to find out what kind of Omnipod the doctor wants pt to have, there are options. Please confrim with [facility] [address].

## 2020-06-20 NOTE — Telephone Encounter (Signed)
I called [employer] (Omnipod).  They stated that they faxed a form to your office (back fax number [phone]) 06/19/2020.  This can be filled out and faxed to [phone].  I am not sure what is happening with these while [name] is out but if you need me to inquire in your office and fill it and return this to you for your signature, I can. Marlena Clipper is the Omnipod rep that has our account.  Her direct line is [phone]. Let me know how I can help.  Oran Rein, RD, CDCES

## 2020-06-20 NOTE — Telephone Encounter (Signed)
Orders

## 2020-07-03 ENCOUNTER — Telehealth: Payer: Self-pay

## 2020-07-03 NOTE — Telephone Encounter (Signed)
Faxed form and progress notes as requested.

## 2020-07-03 NOTE — Telephone Encounter (Signed)
Boneta Lucks is calling in from [employer] stating they sent in a fax for a detailed order's on 06/28/20, has not heard back from anyone and is asking for an update.   Fax:[phone]  Account number is: [phone]

## 2020-07-16 ENCOUNTER — Other Ambulatory Visit: Payer: Self-pay | Admitting: Cardiology

## 2020-07-17 NOTE — Telephone Encounter (Signed)
This is Dr. Schumann's pt 

## 2020-07-19 ENCOUNTER — Other Ambulatory Visit: Payer: Self-pay | Admitting: Internal Medicine

## 2020-07-19 DIAGNOSIS — IMO0002 Reserved for concepts with insufficient information to code with codable children: Secondary | ICD-10-CM

## 2020-07-19 DIAGNOSIS — E1165 Type 2 diabetes mellitus with hyperglycemia: Secondary | ICD-10-CM

## 2020-07-19 DIAGNOSIS — E1140 Type 2 diabetes mellitus with diabetic neuropathy, unspecified: Secondary | ICD-10-CM

## 2020-07-30 ENCOUNTER — Ambulatory Visit: Payer: 59 | Admitting: Internal Medicine

## 2020-08-05 ENCOUNTER — Other Ambulatory Visit: Payer: Self-pay

## 2020-08-05 ENCOUNTER — Encounter: Payer: 59 | Attending: Internal Medicine | Admitting: Nutrition

## 2020-08-05 DIAGNOSIS — E1100 Type 2 diabetes mellitus with hyperosmolarity without nonketotic hyperglycemic-hyperosmolar coma (NKHHC): Secondary | ICD-10-CM | POA: Insufficient documentation

## 2020-08-06 ENCOUNTER — Telehealth: Payer: Self-pay | Admitting: Nutrition

## 2020-08-06 NOTE — Patient Instructions (Addendum)
Read handouts given and review the resource guide again. Call OmniPod help line if questions.

## 2020-08-06 NOTE — Progress Notes (Addendum)
Patient is here today to start her Dash pump.  She has already set up her PDM with date/time correctly and reports having read the resource guide.  Settings were put into the PDM per Dr. Harvel Ricks orders:  Basal rate: 1.25, I/C ratio: 12, ISF: 25, target: 110 (per patient request) with correction over 120.  She was shown how to bolus and re demonstrated this correctly X2.  She reports that she knows how to count carbs, and we reviewed this.  She says she has a yellow card with list of 15 gram portions sizes of carbs.  She was also reminded that when she drinks her 20 cups of coffee a day, with 8 ounces of milk in them, that she will also have to count these.  She agreed to do this, and reported that she is trying to cut back on how many of these she drinks/day.  We also reviewed all items on the checklist--high blood sugar protocol, sick day guidelines, how/when to do an extended bolus, and temp basal rates, alerts and alarms, and how to change the basal rate and bolus settings.  She was given handouts on all of the above topics.  She was told to review these, and to call if questions. She signed the checklist as understanding all topics and had no final questions.

## 2020-08-06 NOTE — Telephone Encounter (Signed)
Patient reported that she had no difficult giving self a bolus for lunch today, but forgot to bolus before supper,and saw her blood sugars go high one hour later.  She then gave her bolus.  We reviewed the appropriate steps for how to give a bolus after the meal, and she reported good understanding of this.  She had no final questions for me.

## 2020-08-12 ENCOUNTER — Encounter: Payer: Self-pay | Admitting: Internal Medicine

## 2020-08-12 ENCOUNTER — Ambulatory Visit (INDEPENDENT_AMBULATORY_CARE_PROVIDER_SITE_OTHER): Payer: 59 | Admitting: Internal Medicine

## 2020-08-12 ENCOUNTER — Other Ambulatory Visit: Payer: Self-pay

## 2020-08-12 VITALS — BP 114/72 | HR 60 | Ht 69.0 in | Wt 265.2 lb

## 2020-08-12 DIAGNOSIS — E1159 Type 2 diabetes mellitus with other circulatory complications: Secondary | ICD-10-CM | POA: Diagnosis not present

## 2020-08-12 DIAGNOSIS — E1142 Type 2 diabetes mellitus with diabetic polyneuropathy: Secondary | ICD-10-CM

## 2020-08-12 DIAGNOSIS — E1165 Type 2 diabetes mellitus with hyperglycemia: Secondary | ICD-10-CM

## 2020-08-12 DIAGNOSIS — Z794 Long term (current) use of insulin: Secondary | ICD-10-CM | POA: Diagnosis not present

## 2020-08-12 DIAGNOSIS — E1140 Type 2 diabetes mellitus with diabetic neuropathy, unspecified: Secondary | ICD-10-CM | POA: Diagnosis not present

## 2020-08-12 DIAGNOSIS — IMO0002 Reserved for concepts with insufficient information to code with codable children: Secondary | ICD-10-CM

## 2020-08-12 LAB — BASIC METABOLIC PANEL
BUN: 21 mg/dL (ref 6–23)
CO2: 28 mEq/L (ref 19–32)
Calcium: 9.4 mg/dL (ref 8.4–10.5)
Chloride: 103 mEq/L (ref 96–112)
Creatinine, Ser: 1.21 mg/dL — ABNORMAL HIGH (ref 0.40–1.20)
GFR: 51.91 mL/min — ABNORMAL LOW (ref >60.00)
Glucose, Bld: 208 mg/dL — ABNORMAL HIGH (ref 70–99)
Potassium: 4.6 mEq/L (ref 3.5–5.1)
Sodium: 138 mEq/L (ref 135–145)

## 2020-08-12 LAB — POCT GLYCOSYLATED HEMOGLOBIN (HGB A1C): Hemoglobin A1C: 10.9 % — AB (ref 4.0–5.6)

## 2020-08-12 LAB — MICROALBUMIN / CREATININE URINE RATIO
Creatinine,U: 25.5 mg/dL
Microalb Creat Ratio: 2.7 mg/g (ref 0.0–30.0)
Microalb, Ur: <0.7 mg/dL (ref 0.0–1.9)

## 2020-08-12 MED ORDER — EMPAGLIFLOZIN 25 MG PO TABS: 25.0000 mg | ORAL_TABLET | Freq: Every day | ORAL | 6 refills | Status: DC

## 2020-08-12 NOTE — Progress Notes (Signed)
Name: [name]  Age/ Sex: 49 y.o., female   MRN/ DOB: [id number], 1971-03-28     PCP: [name], MD   Reason for Endocrinology Evaluation: Type 2 Diabetes Mellitus  Initial Endocrine Consultative Visit: 11/16/2019    PATIENT IDENTIFIER: [name] is a 49 y.o. female with a past medical history of CAD, T2DM and HTN. The patient has followed with Endocrinology clinic since 11/16/2019 for consultative assistance with management of her diabetes.  DIABETIC HISTORY:  [name] was diagnosed with DM in 2004. She is intolerant to higher doses of metformin, nor to Trulicity/ Victoza  due to diarrhea. She was able to tolerate Victoza which was started 05/2019. Her hemoglobin A1c has ranged from 7.2% in 2013, peaking at >15.0% in 2019.    On her initial visit to our clinic she had an A1c of 15.1% . She was on Levemir, Metformin  And victoza and we added [place].   Victoza was stopped 05/2020 due to "severe nausea"  She was started on the Omnipod 07/2020   Victoza stopped 05/2020 due to nausea and diarrhea  SUBJECTIVE:   During the last visit (05/16/2020): A1c 15.1 %.We switched Levemir to [place] and stopped Victoza, continued  metformin and Jardiance       Today (08/12/2020): [name] is here for a follow up on diabetes management.  She checks her blood sugars multiple times a day through the Dexcom   This patient with type 2 diabetes is treated with Omnipod (insulin pump). During the visit the pump basal and bolus doses were reviewed including carb/insulin rations and supplemental doses. The clinical list was updated. The glucose meter download was reviewed in detail to determine if the current pump settings are providing the best glycemic control without excessive hypoglycemia.  She is c/o weight gain and left leg swelling, since her heart surgery. Was on lasix  Which has not helped.   Pump and meter download:    Pump   Omnipod Settings   Insulin type    NOVOLOG    Basal rate       0000 1.25 u/h               I:C ratio       0000 1:12                  Sensitivity       0000  25      Goal       0000  110-120            Type & Model of Pump: Omnipod Insulin Type: Currently using Novolog   Body mass index is 39.17 kg/m.  PUMP STATISTICS: Average BG: 202 BG Readings: (2.1 / day) Average Daily Carbs (g): 151  Average Total Daily Insulin: 46.1  Average Daily Basal: 14 (53 %) Average Daily Bolus: 12.4 (47 %)      HOME DIABETES REGIMEN:  Metformin 500 mg XR , 2 tablet with breakfast  Jardiance 10 mg daily       Statin: Yes ACE-I/ARB: no    METER DOWNLOAD SUMMARY: Date range evaluated: 8/18-9/16/2021 Average Number Tests/Day = 0.2 Overall Mean FS Glucose = 414 Standard Deviation = 160  BG Ranges: Low = 187 High = 594   Hypoglycemic Events/30 Days: BG < 50 = 0 Episodes of symptomatic severe hypoglycemia = 0    DIABETIC COMPLICATIONS: Microvascular complications:   Neuropathy, retinopathy  Denies: CKD  Last Eye Exam: Completed  Macrovascular complications:   CAD ( S/P CABG 10/2019)  Denies:  CVA, PVD   HISTORY:  Past Medical History:  Past Medical History:  Diagnosis Date  . Anxiety   . Arthritis    fingers  . Cervical dysplasia   . Coronary artery disease   . Depression   . Diabetes mellitus    Type II  . Dyspnea 11/22/2013  . GERD (gastroesophageal reflux disease)   . High cholesterol   . History of kidney stones    passed  . Hypertension    no meds now  . Kidney stones   . Moderate episode of recurrent major depressive disorder (HCC) 12/28/2019  . Neuropathy    feet and legs  . Nuclear sclerotic cataract of both eyes 09/2019   Dr. Shea Evans  . PONV (postoperative nausea and vomiting)   . Sleep apnea    lost 70lbs no cpap x73yrs now  . Stage 3a chronic kidney disease (HCC) 04/29/2020   Past Surgical History:  Past Surgical History:  Procedure Laterality Date  .  CARDIAC CATHETERIZATION     4-88yrs ago  . CERVICAL CONIZATION W/BX N/A 05/04/2018   Procedure: CONIZATION CERVIX WITH BIOPSY - COLD KNIFE;  Surgeon: Hermina Staggers, MD;  Location: [facility];  Service: Gynecology;  Laterality: N/A;  . CHOLECYSTECTOMY    . CORONARY ARTERY BYPASS GRAFT N/A 10/24/2019   Procedure: CORONARY ARTERY BYPASS GRAFTING (CABG) times four, using left radial artery harvested endoscopically and right greater saphenous vein harvested endoscopically.;  Surgeon: Kerin Perna, MD;  Location: [facility] OR;  Service: Open Heart Surgery;  Laterality: N/A;  . ENDOMETRIAL ABLATION     6 years ago  . INCISION AND DRAINAGE     for boil- buttocks  . INCISION AND DRAINAGE Left    Leg, Nephrotoxin fasciotimy  . IRRIGATION AND DEBRIDEMENT ABSCESS Right 03/17/2017   Procedure: IRRIGATION AND DEBRIDEMENT RIGHT THIGH ABSCESS;  Surgeon: Karie Soda, MD;  Location: [facility] ORS;  Service: General;  Laterality: Right;  . LAPAROSCOPIC APPENDECTOMY  10/04/2011   Procedure: APPENDECTOMY LAPAROSCOPIC;  Surgeon: Rulon Abide, DO;  Location: [facility];  Service: General;  Laterality: N/A;  . LEFT HEART CATH AND CORONARY ANGIOGRAPHY N/A 10/06/2019   Procedure: LEFT HEART CATH AND CORONARY ANGIOGRAPHY;  Surgeon: [place], Peter M, MD;  Location: [facility] INV[facility]IVE CV [facility];  Service: Cardiovascular;  Laterality: N/A;  . RADIAL ARTERY HARVEST Left 10/24/2019   Procedure: Radial Artery Harvest;  Surgeon: Kerin Perna, MD;  Location: [facility] OR;  Service: Open Heart Surgery;  Laterality: Left;  . TEE WITHOUT CARDIOVERSION N/A 10/24/2019   Procedure: TRANSESOPHAGEAL ECHOCARDIOGRAM (TEE);  Surgeon: Donata Clay, Theron Arista, MD;  Location: [facility] OR;  Service: Open Heart Surgery;  Laterality: N/A;    Social History:  reports that she quit smoking about 3 years ago. Her smoking use included e-cigarettes and cigarettes. She has a 48.00 pack-year smoking history. She has never used smokeless tobacco. She reports current alcohol  use. She reports that she does not use drugs. Family History:  Family History  Problem Relation Age of Onset  . Aneurysm Mother   . Pancreatic cancer Mother   . Heart attack Father   . Stroke Father   . Heart disease Sister   . Breast cancer Paternal Grandmother   . Congestive Heart Failure Sister   . Osteoarthritis Sister   . Osteoarthritis Sister      HOME MEDICATIONS: Allergies as of 08/12/2020      Reactions  Sulfa Antibiotics Hives   Codeine Itching      Medication List       Accurate as of August 12, 2020 10:01 AM. If you have any questions, ask your nurse or doctor.        STOP taking these medications   Tresiba FlexTouch 100 UNIT/ML FlexTouch Pen Generic drug: insulin degludec Stopped by: Scarlette Shorts, MD     TAKE these medications   acetaminophen 500 MG tablet Commonly known as: TYLENOL Take 1,000 mg by mouth every 6 (six) hours as needed for moderate pain or headache.   albuterol 108 (90 Base) MCG/ACT inhaler Commonly known as: VENTOLIN HFA Inhale 1-2 puffs into the lungs every 6 (six) hours as needed for wheezing or shortness of breath.   aspirin 325 MG EC tablet Take 1 tablet (325 mg total) by mouth daily.   atorvastatin 80 MG tablet Commonly known as: LIPITOR TAKE 1 TABLET(80 MG) BY MOUTH DAILY AT 6 PM   azelastine 0.1 % nasal spray Commonly known as: ASTELIN Place 2 sprays into both nostrils 2 (two) times daily. What changed:   when to take this  reasons to take this   Dexcom G6 Sensor Misc 1 Device by Does not apply route as directed.   Dexcom G6 Transmitter Misc 1 Device by Does not apply route as directed.   empagliflozin 25 MG Tabs tablet Commonly known as: Jardiance Take 1 tablet (25 mg total) by mouth daily before breakfast. What changed:   medication strength  See the new instructions. Changed by: Scarlette Shorts, MD   esomeprazole 20 MG capsule Commonly known as: NEXIUM Take 20 mg by mouth daily.    ezetimibe 10 MG tablet Commonly known as: ZETIA Take 10 mg by mouth daily.   ezetimibe 10 MG tablet Commonly known as: ZETIA Take 1 tablet (10 mg total) by mouth daily.   FLUoxetine 40 MG capsule Commonly known as: PROZAC TAKE 1 CAPSULE(40 MG) BY MOUTH DAILY   fluticasone 50 MCG/ACT nasal spray Commonly known as: FLONASE Place 2 sprays into both nostrils daily as needed for allergies or rhinitis.   furosemide 20 MG tablet Commonly known as: LASIX Take 1 tablet (20 mg total) by mouth as needed (for ankle swelling).   gabapentin 300 MG capsule Commonly known as: NEURONTIN TAKE 3 CAPSULES BY MOUTH EVERY MORNING, 1 IN THE AFTERNOON AND 3 AT NIGHT   glucose blood test strip Commonly known as: True Metrix Blood Glucose Test Use as instructed   Melatonin 10 MG Caps Take 10 mg by mouth at bedtime as needed (sleep).   metFORMIN 500 MG 24 hr tablet Commonly known as: GLUCOPHAGE-XR Take 2 tablets (1,000 mg total) by mouth daily with breakfast.   metoprolol succinate 25 MG 24 hr tablet Commonly known as: Toprol XL Take 0.5 tablets (12.5 mg total) by mouth daily.   NovoLOG FlexPen 100 UNIT/ML FlexPen Generic drug: insulin aspart Inject 10 Units into the skin 3 (three) times daily with meals.   OmniPod 10 Pack Misc 1 Device by Does not apply route as directed.   Pen Needles 32G X 4 MM Misc 1 Device by Does not apply route in the morning, at noon, in the evening, and at bedtime.   TRUEplus Safety Lancets 28G Misc Use as directed        OBJECTIVE:   Vital Signs: BP 114/72   Pulse 60   Ht 5\' 9"  (1.753 m)   Wt 265 lb 4 oz (120.3 kg)  SpO2 98%   BMI 39.17 kg/m    Wt Readings from Last 3 Encounters:  08/12/20 265 lb 4 oz (120.3 kg)  06/04/20 248 lb 3.2 oz (112.6 kg)  05/16/20 240 lb 12.8 oz (109.2 kg)     Exam: General: Pt appears well and is in NAD  Neck: General: Supple without adenopathy. Thyroid: Thyroid size normal.  No goiter or nodules appreciated.   Lungs: Clear with good BS bilat with no rales, rhonchi, or wheezes  Heart: RRR with normal S1 and S2 and no gallops; no murmurs; no rub  Abdomen: Normoactive bowel sounds, soft, nontender, without masses or organomegaly palpable  Extremities: 1+  pretibial edema on the left and trace on the right   Neuro: MS is good with appropriate affect, pt is alert and Ox3     DM foot exam: 11/16/2019  The skin of the feet is intact without sores or ulcerations. The pedal pulses are undetectable on today's exam  The sensation is decreased to a screening 5.07, 10 gram monofilament bilaterally   DATA REVIEWED:  Lab Results  Component Value Date   HGBA1C 10.9 (A) 08/12/2020   HGBA1C 15.1 (H) 10/20/2019   HGBA1C 12.2 (A) 06/05/2019   Lab Results  Component Value Date   MICROALBUR 0.96 06/02/2010   LDLCALC 78 10/02/2019   CREATININE 1.27 (H) 02/20/2020   Lab Results  Component Value Date   MICRALBCREAT 15 06/05/2019     Lab Results  Component Value Date   CHOL 164 10/02/2019   HDL 58 10/02/2019   LDLCALC 78 10/02/2019   TRIG 164 (H) 10/02/2019   CHOLHDL 2.8 10/02/2019       Results for DEMARIYAH, PIZZO (MRN [id number]) as of 08/12/2020 13:31  Ref. Range 08/12/2020 10:08  Sodium Latest Ref Range: 135 - 145 mEq/L 138  Potassium Latest Ref Range: 3.5 - 5.1 mEq/L 4.6  Chloride Latest Ref Range: 96 - 112 mEq/L 103  CO2 Latest Ref Range: 19 - 32 mEq/L 28  Glucose Latest Ref Range: 70 - 99 mg/dL 116 (H)  BUN Latest Ref Range: 6 - 23 mg/dL 21  Creatinine Latest Ref Range: 0.40 - 1.20 mg/dL 5.79 (H)  Calcium Latest Ref Range: 8.4 - 10.5 mg/dL 9.4  GFR Latest Ref Range: >60.00 mL/min 51.91 (L)  MICROALB/CREAT RATIO Latest Ref Range: 0.0 - 30.0 mg/g 2.7     ASSESSMENT / PLAN / RECOMMENDATIONS:   1) Type 2 Diabetes Mellitus, Poorly controlled, With Neuropathic, retinopathic and macrovascular complications - Most recent A1c of 10.9 %. Goal A1c < 7.0 %.       - A1c down from 15.3%   - She is doing great with the Omnipod  - I will make adjustments to her basal rate and I: C ratio as below  - Will increase Jardiance as  Below  - BMP shows stable GFR    MEDICATIONS: - Continue Metformin 500 mg XR , 2 tablet with breakfast  - Increase  Jardiance 25 mg , 1 tablet daily with Breakfast     Pump   Omnipod Settings   Insulin type   NOVOLOG    Basal rate       0000 1.35 u/h               I:C ratio       0000 1:10                  Sensitivity       0000  25      Goal       0000  110-120    EDUCATION / INSTRUCTIONS:  BG monitoring instructions: Patient is instructed to check her blood sugars 3 times a day, preprandial  Call [facility] if: BG persistently < 70  . I reviewed the Rule of 15 for the treatment of hypoglycemia in detail with the patient. Literature supplied.     2) Diabetic complications:   Eye: Does  have known diabetic retinopathy.   Neuro/ Feet: Does have known diabetic peripheral neuropathy .   Renal: Patient does not have known baseline CKD. She   is not on an ACEI/ARB at present.      F/U in 3 months    Signed electronically by: Lyndle Herrlich, MD  [facility]  [facility] [address] [place] [place], [place] [address] Phone: [phone] FAX: [phone]   CC: [name], MD [address] [place] [address] Phone: [phone]  Fax: [phone]  Return to Endocrinology clinic as below: Future Appointments  Date Time Provider Department Center  08/27/2020 10:00 AM Little Ishikawa, MD [facility]  06/03/2021  1:00 PM Pollyann Savoy, MD [facility] None

## 2020-08-12 NOTE — Patient Instructions (Signed)
-   Keep up the good Work  - Continue Metformin 500 mg XR , 2 tablet with breakfast  - Increase  Jardiance 25 mg , 1 tablet daily with Breakfast         HOW TO TREAT LOW BLOOD SUGARS (Blood sugar LESS THAN 70 MG/DL)  Please follow the RULE OF 15 for the treatment of hypoglycemia treatment (when your (blood sugars are less than 70 mg/dL)    STEP 1: Take 15 grams of carbohydrates when your blood sugar is low, which includes:   3-4 GLUCOSE TABS  OR  3-4 OZ OF JUICE OR REGULAR SODA OR  ONE TUBE OF GLUCOSE GEL     STEP 2: RECHECK blood sugar in 15 MINUTES STEP 3: If your blood sugar is still low at the 15 minute recheck --> then, go back to STEP 1 and treat AGAIN with another 15 grams of carbohydrates.

## 2020-08-20 ENCOUNTER — Other Ambulatory Visit: Payer: Self-pay | Admitting: Internal Medicine

## 2020-08-20 ENCOUNTER — Encounter: Payer: Self-pay | Admitting: Internal Medicine

## 2020-08-20 DIAGNOSIS — E1140 Type 2 diabetes mellitus with diabetic neuropathy, unspecified: Secondary | ICD-10-CM

## 2020-08-20 DIAGNOSIS — E1165 Type 2 diabetes mellitus with hyperglycemia: Secondary | ICD-10-CM

## 2020-08-20 DIAGNOSIS — IMO0002 Reserved for concepts with insufficient information to code with codable children: Secondary | ICD-10-CM

## 2020-08-21 MED ORDER — METFORMIN HCL ER 500 MG PO TB24: 1000.0000 mg | ORAL_TABLET | Freq: Every day | ORAL | 1 refills | Status: DC

## 2020-08-25 NOTE — Progress Notes (Signed)
Cardiology Office Note:    Date:  08/28/2020   ID:  [name], DOB [birth year], MRN [id number]  PCP:  [name], MD  Cardiologist:  Little Ishikawa, MD  Electrophysiologist:  None   Referring MD: Marcine Matar, MD   Chief Complaint  Patient presents with  . Coronary Artery Disease   History of Present Illness:    [name] is a 49 y.o. female with a hx of coronary artery disease status post CABG x4 on 10/24/2019 (LIMA-LAD, radial-diagonal, SVG-diagonal, SVG-PDA), type 2 diabetes, hypertension, hyperlipidemia who presents for a follow-up evaluation.  Patient was admitted to [facility] on 06/13/19 with chest pain.  EKG was unremarkable and high-sensitivity troponins were negative.  However due to her extensive risk factors including uncontrolled diabetes, coronary CTA was ordered.  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  She was discharged on medical management: atorvastatin 80 mg, Imdur 30 mg.  TTE showed EF 60-65%.  During subsequent office visits, her antianginal regimen was titrated as she continued to report chest pain.  At last clinic visit on 10/02/2019, she was on metoprolol 25 mg twice daily and Imdur 60 mg daily, but further titration was limited by low blood pressures.  Given inability to further titrate up antianginals and having persistent chest pain, cardiac catheterization was ordered.  Underwent cath on 10/06/2019, which showed three-vessel CAD: 80% mid LAD, 85% slitlike ostial D1, 80% ostial D2, 80% small PDA, anomalous circumflex off the RCA cusp with moderate diffuse disease.  Recommendation was for aggressive medical management and if  symptoms persist consider surgical consultation.  She continued to have chest pain despite medical management, so was referred to cardiac surgery.  She underwent CABG x4 on 10/24/2019 (LIMA-LAD, radial-diagonal, SVG-diagonal, SVG-PDA) with Dr. Donata Clay.  Since last clinic visit, she reports she has gained 33 pounds.  Reports swelling in her legs all the time, has to elevate legs.  She has not been exercising.  Having pain particularly in the back of the left leg, occurs at rest.  Denies any chest pain but does report occasional dyspnea and left arm pain.  Having some lightheadedness with standing, denies any syncope.  She is on an insulin pump now.  She continues to vape.  Wt Readings from Last 3 Encounters:  08/27/20 265 lb 9.6 oz (120.5 kg)  08/12/20 265 lb 4 oz (120.3 kg)  06/04/20 248 lb 3.2 oz (112.6 kg)     Past Medical History:  Diagnosis Date  . Anxiety   . Arthritis    fingers  . Cervical dysplasia   . Coronary artery disease   . Depression   . Diabetes mellitus    Type II  . Dyspnea 11/22/2013  . GERD (gastroesophageal reflux disease)   . High cholesterol   . History of kidney stones    passed  . Hypertension    no meds now  . Kidney stones   . Moderate episode of recurrent major depressive disorder (HCC) 12/28/2019  . Neuropathy    feet and legs  . Nuclear sclerotic cataract of both eyes 09/2019   Dr. Shea Evans  . PONV (postoperative nausea and vomiting)   . Sleep apnea  lost 70lbs no cpap x58yrs now  . Stage 3a chronic kidney disease (HCC) 04/29/2020    Past Surgical History:  Procedure Laterality Date  . CARDIAC CATHETERIZATION     4-19yrs ago  . CERVICAL CONIZATION W/BX N/A 05/04/2018   Procedure: CONIZATION CERVIX WITH BIOPSY - COLD KNIFE;  Surgeon: Hermina Staggers, MD;  Location: [facility];  Service: Gynecology;  Laterality: N/A;  . CHOLECYSTECTOMY    . CORONARY ARTERY BYPASS GRAFT N/A 10/24/2019   Procedure: CORONARY ARTERY BYPASS GRAFTING (CABG)  times four, using left radial artery harvested endoscopically and right greater saphenous vein harvested endoscopically.;  Surgeon: Kerin Perna, MD;  Location: [facility] OR;  Service: Open Heart Surgery;  Laterality: N/A;  . ENDOMETRIAL ABLATION     6 years ago  . INCISION AND DRAINAGE     for boil- buttocks  . INCISION AND DRAINAGE Left    Leg, Nephrotoxin fasciotimy  . IRRIGATION AND DEBRIDEMENT ABSCESS Right 03/17/2017   Procedure: IRRIGATION AND DEBRIDEMENT RIGHT THIGH ABSCESS;  Surgeon: Karie Soda, MD;  Location: [facility] ORS;  Service: General;  Laterality: Right;  . LAPAROSCOPIC APPENDECTOMY  10/04/2011   Procedure: APPENDECTOMY LAPAROSCOPIC;  Surgeon: Rulon Abide, DO;  Location: [facility] ORS;  Service: General;  Laterality: N/A;  . LEFT HEART CATH AND CORONARY ANGIOGRAPHY N/A 10/06/2019   Procedure: LEFT HEART CATH AND CORONARY ANGIOGRAPHY;  Surgeon: Swaziland, Peter M, MD;  Location: [facility] INVASIVE CV LAB;  Service: Cardiovascular;  Laterality: N/A;  . RADIAL ARTERY HARVEST Left 10/24/2019   Procedure: Radial Artery Harvest;  Surgeon: Kerin Perna, MD;  Location: [facility] OR;  Service: Open Heart Surgery;  Laterality: Left;  . TEE WITHOUT CARDIOVERSION N/A 10/24/2019   Procedure: TRANSESOPHAGEAL ECHOCARDIOGRAM (TEE);  Surgeon: Donata Clay, Theron Arista, MD;  Location: [facility] OR;  Service: Open Heart Surgery;  Laterality: N/A;    Current Medications: Current Meds  Medication Sig  . acetaminophen (TYLENOL) 500 MG tablet Take 1,000 mg by mouth every 6 (six) hours as needed for moderate pain or headache.  . albuterol (VENTOLIN HFA) 108 (90 Base) MCG/ACT inhaler Inhale 1-2 puffs into the lungs every 6 (six) hours as needed for wheezing or shortness of breath.  Marland Kitchen atorvastatin (LIPITOR) 80 MG tablet TAKE 1 TABLET(80 MG) BY MOUTH DAILY AT 6 PM  . azelastine (ASTELIN) 0.1 % nasal spray Place 2 sprays into both nostrils 2 (two) times daily.  . Continuous Blood Gluc Sensor (DEXCOM G6 SENSOR) MISC 1 Device by Does not apply  route as directed.  . Continuous Blood Gluc Transmit (DEXCOM G6 TRANSMITTER) MISC 1 Device by Does not apply route as directed.  . empagliflozin (JARDIANCE) 25 MG TABS tablet Take 1 tablet (25 mg total) by mouth daily before breakfast.  . esomeprazole (NEXIUM) 20 MG capsule Take 20 mg by mouth daily.   Marland Kitchen ezetimibe (ZETIA) 10 MG tablet Take 1 tablet (10 mg total) by mouth daily.  Marland Kitchen ezetimibe (ZETIA) 10 MG tablet Take 10 mg by mouth daily.  Marland Kitchen FLUoxetine (PROZAC) 40 MG capsule TAKE 1 CAPSULE(40 MG) BY MOUTH DAILY  . fluticasone (FLONASE) 50 MCG/ACT nasal spray Place 2 sprays into both nostrils daily as needed for allergies or rhinitis.  Marland Kitchen gabapentin (NEURONTIN) 300 MG capsule TAKE 3 CAPSULES BY MOUTH EVERY MORNING, 1 IN THE AFTERNOON AND 3 AT NIGHT  . glucose blood (TRUE METRIX BLOOD GLUCOSE TEST) test strip Use as instructed  . insulin aspart (NOVOLOG FLEXPEN) 100 UNIT/ML FlexPen Inject 10 Units into the  skin 3 (three) times daily with meals.  . Insulin Disposable Pump (OMNIPOD 10 PACK) MISC 1 Device by Does not apply route as directed.  . Insulin Pen Needle (PEN NEEDLES) 32G X 4 MM MISC 1 Device by Does not apply route in the morning, at noon, in the evening, and at bedtime.  . Melatonin 10 MG CAPS Take 10 mg by mouth at bedtime as needed (sleep).  . metFORMIN (GLUCOPHAGE-XR) 500 MG 24 hr tablet Take 2 tablets (1,000 mg total) by mouth daily with breakfast.  . metoprolol succinate (TOPROL XL) 25 MG 24 hr tablet Take 0.5 tablets (12.5 mg total) by mouth daily.  . TRUEPLUS SAFETY LANCETS 28G MISC Use as directed  . [DISCONTINUED] aspirin 325 MG EC tablet Take 1 tablet (325 mg total) by mouth daily.     Allergies:   Sulfa antibiotics and Codeine   Social History   Socioeconomic History  . Marital status: Widowed    Spouse name: Not on file  . Number of children: 1  . Years of education: Not on file  . Highest education level: Not on file  Occupational History  . Occupation: none  Tobacco  Use  . Smoking status: Former Smoker    Packs/day: 2.00    Years: 24.00    Pack years: 48.00    Types: E-cigarettes, Cigarettes    Quit date: 04/22/2017    Years since quitting: 3.3  . Smokeless tobacco: Never Used  . Tobacco comment: vape  Vaping Use  . Vaping Use: Some days  . Devices: 1 pod last 3-4 days  Substance and Sexual Activity  . Alcohol use: Yes    Comment: rare  . Drug use: No  . Sexual activity: Yes    Birth control/protection: None  Other Topics Concern  . Not on file  Social History Narrative  . Not on file   Social Determinants of Health   Financial Resource Strain: Not on file  Food Insecurity: Not on file  Transportation Needs: Not on file  Physical Activity: Not on file  Stress: Not on file  Social Connections: Not on file     Family History: The patient's family history includes Aneurysm in her mother; Breast cancer in her paternal grandmother; Congestive Heart Failure in her sister; Heart attack in her father; Heart disease in her sister; Osteoarthritis in her sister and sister; Pancreatic cancer in her mother; Stroke in her father.  ROS:   Please see the history of present illness.     All other systems reviewed and are negative.  EKGs/Labs/Other Studies Reviewed:    The following studies were reviewed today:   EKG:  EKG is ordered today.  The ekg ordered today demonstrates normal sinus rhythm, rate 70, right bundle branch block, no ST abnormalities  TTE 06/14/19:  1. Left ventricular ejection fraction, by visual estimation, is 60 to 65%. The left ventricle has normal function. Normal left ventricular size. There is no left ventricular hypertrophy.  2. Left ventricular diastolic Doppler parameters are consistent with impaired relaxation pattern of LV diastolic filling.  3. Global right ventricle has normal systolic function.The right ventricular size is normal. No increase in right ventricular wall thickness.  4. Left atrial size was normal.   5. Right atrial size was normal.  6. The mitral valve is normal in structure. No evidence of mitral valve regurgitation. No evidence of mitral stenosis.  7. The tricuspid valve is normal in structure. Tricuspid valve regurgitation was not visualized by color flow  Doppler.  8. The aortic valve is normal in structure. Aortic valve regurgitation was not visualized by color flow Doppler. Structurally normal aortic valve, with no evidence of sclerosis or stenosis.  9. The pulmonic valve was normal in structure. Pulmonic valve regurgitation is not visualized by color flow Doppler. 10. The inferior vena cava is normal in size with greater than 50% respiratory variability, suggesting right atrial pressure of 3 mmHg.  Coronary CT 06/15/19: 1. Coronary calcium score of 81. This was 50 percentile for age and sex matched control. 2. 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 Noncalcified plaque in mid LCX appears to cause severe stenosis (70-99%) but vessel is small (<69mm) 3. Calcified plaque in the proximal LAD causes 25-49% stenosis  CTFFR 06/15/19: 1. CT-FFR across lesion in proximal LAD is 0.92, suggesting lesion is not functionally significant 2. CT-FFR across lesion in ostial D2 is 0.62, suggesting functional significance 3. CT-FFR across lesion in anomalous circumflex is 0.65, suggesting functional significance  Cath 10/06/19:  Prox Cx lesion is 60% stenosed.  Mid Cx lesion is 70% stenosed.  RPDA lesion is 80% stenosed.  Mid LM to Ost LAD lesion is 35% stenosed.  1st Diag lesion is 85% stenosed.  2nd Diag lesion is 80% stenosed.  Mid LAD lesion is 80% stenosed.  LV end diastolic pressure is normal.   1. Three vessel CAD.    - 80% mid LAD after the second diagonal    - 85% slit like stenosis at the origin of the first diagonal- best seen in the LAO caudal shot.    - 80% ostial second diagonal    - Anomalous LCx off the RCA  cusp with moderate diffuse disease    - 80% small PDA 2. Normal LVEDP  Plan: reviewed with interventional colleagues. Her anatomy doesn't offer good PCI options except the mid LAD. Treating the ostial disease in the diagonal branches would likely require stenting into the LAD. The LCx and PDA are too small for PCI. I would aggressively treat medically. If symptoms persist I would consider surgical consultation.     Recent Labs: 11/13/2019: Magnesium 1.6 08/27/2020: ALT WILL FOLLOW; BNP WILL FOLLOW; BUN WILL FOLLOW; Creatinine, Ser WILL FOLLOW; Hemoglobin 14.2; Platelets 235; Potassium WILL FOLLOW; Sodium WILL FOLLOW; TSH WILL FOLLOW  Recent Lipid Panel    Component Value Date/Time   CHOL WILL FOLLOW 08/27/2020 1227   TRIG WILL FOLLOW 08/27/2020 1227   HDL WILL FOLLOW 08/27/2020 1227   CHOLHDL WILL FOLLOW 08/27/2020 1227   CHOLHDL 5.5 06/14/2019 0009   VLDL 47 (H) 06/14/2019 0009   LDLCALC WILL FOLLOW 08/27/2020 1227    Physical Exam:    VS:  BP 122/82   Pulse 70   Ht 5\' 9"  (1.753 m)   Wt 265 lb 9.6 oz (120.5 kg)   SpO2 95%   BMI 39.22 kg/m     Wt Readings from Last 3 Encounters:  08/27/20 265 lb 9.6 oz (120.5 kg)  08/12/20 265 lb 4 oz (120.3 kg)  06/04/20 248 lb 3.2 oz (112.6 kg)    GEN:  in no acute distress HEENT: Normal NECK: No JVD LYMPHATICS: No lymphadenopathy CARDIAC: RRR, no murmurs, rubs, gallops RESPIRATORY:  Clear to auscultation without rales, wheezing or rhonchi  ABDOMEN: Soft, non-tender, non-distended MUSCULOSKELETAL:  No edema; No deformity  SKIN: Warm and dry.   NEUROLOGIC:  Alert and oriented x 3 PSYCHIATRIC:  Normal affect   ASSESSMENT:    1.  CAD in native artery   2. S/P CABG (coronary artery bypass graft)   3. Hyperlipidemia, unspecified hyperlipidemia type   4. Lower extremity edema   5. SOB (shortness of breath)   6. Tobacco use    PLAN:    Coronary artery disease: three-vessel CAD on cath 10/06/19: 80% mid LAD, 85% slitlike ostial  D1, 80% ostial D2, 80% small PDA, anomalous circumflex off the RCA cusp with moderate diffuse disease.   Status post CABG x4 on 10/24/2019 (LIMA-LAD, radial-diagonal, SVG-diagonal, SVG-PDA).  Normal LV systolic function on echo 06/14/2019. -Continue aspirin, will decrease to 81 mg daily -Continue atorvastatin 80 mg daily.  LDL 78 on 10/02/19, added zetia 10 mg daily.  Will check lipid panel to ensure LDL less than 70. -Continue Toprol-XL 12.5 mg daily  Lower extremity edema: We will check echocardiogram to evaluate for structural heart disease.  Check CMET, BNP, TSH.  Will check lower extremity duplex to rule out DVT.  Start Lasix 20 mg daily x3 days then as needed.  Hyperlipidemia: LDL 204 on 06/14/19.  Started on atorvastatin 80 mg at that time.  LDL 78 on 10/02/19, added zetia 10 mg daily.  Will repeat lipid panel.  Tobacco use: smoked 2ppd x 30 years, has quit smoking but now vaping.  Have discussed risks of vaping and cessation strongly recommended  Type 2 diabetes: A1c 10.9 on 08/12/2020, down from 15.1 on 10/20/2019.  On insulin pump, Metformin, Jardiance.  Follows with endocrinology.    RTC in 3 months   Medication Adjustments/Labs and Tests Ordered: Current medicines are reviewed at length with the patient today.  Concerns regarding medicines are outlined above.  Orders Placed This Encounter  Procedures  . Comprehensive metabolic panel  . CBC  . Lipid panel  . TSH  . Brain natriuretic peptide  . EKG 12-Lead  . ECHOCARDIOGRAM COMPLETE  . VAS [place] LOWER EXTREMITY VENOUS (DVT)   Meds ordered this encounter  Medications  . aspirin 81 MG EC tablet    Sig: Take 1 tablet (81 mg total) by mouth daily.  . furosemide (LASIX) 20 MG tablet    Sig: Take 1 tablet (20 mg total) by mouth as needed (for ankle swelling).    Dispense:  30 tablet    Refill:  1    Patient Instructions  Medication Instructions:  Take furosemide (Lasix) 20 mg daily x 3 days --then take as needed  DECREASE  aspirin to 81 mg daily  *If you need a refill on your cardiac medications before your next appointment, please call your pharmacy*   Lab Work: CMET, CBC, Lipid, TSH, BNP today  If you have labs (blood work) drawn today and your tests are completely normal, you will receive your results only by: Marland Kitchen MyChart Message (if you have MyChart) OR . A paper copy in the mail If you have any lab test that is abnormal or we need to change your treatment, we will call you to review the results.   Testing/Procedures: Your physician has requested that you have an echocardiogram. Echocardiography is a painless test that uses sound waves to create images of your heart. It provides your doctor with information about the size and shape of your heart and how well your heart's chambers and valves are working. This procedure takes approximately one hour. There are no restrictions for this procedure.  Your physician has requested that you have a lower extremity venous duplex. This test is an ultrasound of the veins in the legs.  Follow-Up: At [facility], you and your health needs are our priority.  As part of our continuing mission to provide you with exceptional heart care, we have created designated Provider Care Teams.  These Care Teams include your primary Cardiologist (physician) and Advanced Practice Providers (APPs -  Physician Assistants and Nurse Practitioners) who all work together to provide you with the care you need, when you need it.  We recommend signing up for the patient portal called "MyChart".  Sign up information is provided on this After Visit Summary.  MyChart is used to connect with patients for Virtual Visits (Telemedicine).  Patients are able to view lab/test results, encounter notes, upcoming appointments, etc.  Non-urgent messages can be sent to your provider as well.   To learn more about what you can do with MyChart, go to [email or web address].    Your next appointment:   6  week(s)  The format for your next appointment:   In Person  Provider:   Epifanio Lesches, MD        Signed, Little Ishikawa, MD  08/28/2020 12:07 AM    [facility]

## 2020-08-27 ENCOUNTER — Ambulatory Visit (INDEPENDENT_AMBULATORY_CARE_PROVIDER_SITE_OTHER): Payer: 59 | Admitting: Cardiology

## 2020-08-27 ENCOUNTER — Encounter: Payer: Self-pay | Admitting: Cardiology

## 2020-08-27 ENCOUNTER — Other Ambulatory Visit: Payer: Self-pay

## 2020-08-27 VITALS — BP 122/82 | HR 70 | Ht 69.0 in | Wt 265.6 lb

## 2020-08-27 DIAGNOSIS — E785 Hyperlipidemia, unspecified: Secondary | ICD-10-CM | POA: Diagnosis not present

## 2020-08-27 DIAGNOSIS — E118 Type 2 diabetes mellitus with unspecified complications: Secondary | ICD-10-CM

## 2020-08-27 DIAGNOSIS — I2510 Atherosclerotic heart disease of native coronary artery without angina pectoris: Secondary | ICD-10-CM

## 2020-08-27 DIAGNOSIS — R600 Localized edema: Secondary | ICD-10-CM | POA: Diagnosis not present

## 2020-08-27 DIAGNOSIS — R0602 Shortness of breath: Secondary | ICD-10-CM | POA: Diagnosis not present

## 2020-08-27 DIAGNOSIS — Z720 Tobacco use: Secondary | ICD-10-CM

## 2020-08-27 DIAGNOSIS — Z951 Presence of aortocoronary bypass graft: Secondary | ICD-10-CM | POA: Diagnosis not present

## 2020-08-27 MED ORDER — FUROSEMIDE 20 MG PO TABS: 20.0000 mg | ORAL_TABLET | ORAL | 1 refills | Status: DC | PRN

## 2020-08-27 MED ORDER — ASPIRIN 81 MG PO TBEC: 81.0000 mg | DELAYED_RELEASE_TABLET | Freq: Every day | ORAL | Status: DC

## 2020-08-27 NOTE — Patient Instructions (Signed)
Medication Instructions:  Take furosemide (Lasix) 20 mg daily x 3 days --then take as needed  DECREASE aspirin to 81 mg daily  *If you need a refill on your cardiac medications before your next appointment, please call your pharmacy*   Lab Work: CMET, CBC, Lipid, TSH, BNP today  If you have labs (blood work) drawn today and your tests are completely normal, you will receive your results only by: Marland Kitchen MyChart Message (if you have MyChart) OR . A paper copy in the mail If you have any lab test that is abnormal or we need to change your treatment, we will call you to review the results.   Testing/Procedures: Your physician has requested that you have an echocardiogram. Echocardiography is a painless test that uses sound waves to create images of your heart. It provides your doctor with information about the size and shape of your heart and how well your heart's chambers and valves are working. This procedure takes approximately one hour. There are no restrictions for this procedure.  Your physician has requested that you have a lower extremity venous duplex. This test is an ultrasound of the veins in the legs.   Follow-Up: At [facility], you and your health needs are our priority.  As part of our continuing mission to provide you with exceptional heart care, we have created designated Provider Care Teams.  These Care Teams include your primary Cardiologist (physician) and Advanced Practice Providers (APPs -  Physician Assistants and Nurse Practitioners) who all work together to provide you with the care you need, when you need it.  We recommend signing up for the patient portal called "MyChart".  Sign up information is provided on this After Visit Summary.  MyChart is used to connect with patients for Virtual Visits (Telemedicine).  Patients are able to view lab/test results, encounter notes, upcoming appointments, etc.  Non-urgent messages can be sent to your provider as well.   To learn  more about what you can do with MyChart, go to [email or web address].    Your next appointment:   6 week(s)  The format for your next appointment:   In Person  Provider:   Epifanio Lesches, MD

## 2020-08-28 LAB — CBC
Hematocrit: 44.7 % (ref 34.0–46.6)
Hemoglobin: 14.2 g/dL (ref 11.1–15.9)
MCH: 26.4 pg — ABNORMAL LOW (ref 26.6–33.0)
MCHC: 31.8 g/dL (ref 31.5–35.7)
MCV: 83 fL (ref 79–97)
Platelets: 235 10*3/uL (ref 150–450)
RBC: 5.38 x10E6/uL — ABNORMAL HIGH (ref 3.77–5.28)
RDW: 12.3 % (ref 11.7–15.4)
WBC: 5.7 10*3/uL (ref 3.4–10.8)

## 2020-08-28 LAB — COMPREHENSIVE METABOLIC PANEL
ALT: 16 IU/L (ref 0–32)
AST: 15 IU/L (ref 0–40)
Albumin/Globulin Ratio: 1.7 (ref 1.2–2.2)
Albumin: 4.3 g/dL (ref 3.8–4.9)
Alkaline Phosphatase: 133 IU/L — ABNORMAL HIGH (ref 44–121)
BUN/Creatinine Ratio: 18 (ref 9–23)
BUN: 23 mg/dL (ref 6–24)
Bilirubin Total: 0.2 mg/dL (ref 0.0–1.2)
CO2: 23 mmol/L (ref 20–29)
Calcium: 9.6 mg/dL (ref 8.7–10.2)
Chloride: 100 mmol/L (ref 96–106)
Creatinine, Ser: 1.27 mg/dL — ABNORMAL HIGH (ref 0.57–1.00)
GFR calc Af Amer: 56 mL/min/{1.73_m2} — ABNORMAL LOW (ref >59)
GFR calc non Af Amer: 49 mL/min/{1.73_m2} — ABNORMAL LOW (ref >59)
Globulin, Total: 2.5 g/dL (ref 1.5–4.5)
Glucose: 281 mg/dL — ABNORMAL HIGH (ref 65–99)
Potassium: 5 mmol/L (ref 3.5–5.2)
Sodium: 139 mmol/L (ref 134–144)
Total Protein: 6.8 g/dL (ref 6.0–8.5)

## 2020-08-28 LAB — LIPID PANEL
Chol/HDL Ratio: 2 ratio (ref 0.0–4.4)
Cholesterol, Total: 152 mg/dL (ref 100–199)
HDL: 76 mg/dL (ref >39)
LDL Chol Calc (NIH): 60 mg/dL (ref 0–99)
Triglycerides: 83 mg/dL (ref 0–149)
VLDL Cholesterol Cal: 16 mg/dL (ref 5–40)

## 2020-08-28 LAB — BRAIN NATRIURETIC PEPTIDE: BNP: 57.6 pg/mL (ref 0.0–100.0)

## 2020-08-28 LAB — TSH: TSH: 2.09 u[IU]/mL (ref 0.450–4.500)

## 2020-08-29 ENCOUNTER — Other Ambulatory Visit: Payer: Self-pay

## 2020-08-29 ENCOUNTER — Ambulatory Visit (HOSPITAL_COMMUNITY)
Admission: RE | Admit: 2020-08-29 | Discharge: 2020-08-29 | Disposition: A | Discharge status: Home / Self Care | Payer: 59 | Source: Ambulatory Visit | Attending: Cardiology | Admitting: Cardiology

## 2020-08-29 DIAGNOSIS — R600 Localized edema: Secondary | ICD-10-CM | POA: Diagnosis present

## 2020-09-18 ENCOUNTER — Telehealth: Payer: Self-pay | Admitting: Internal Medicine

## 2020-09-18 NOTE — Telephone Encounter (Signed)
Pt called requesting a refill of her insulin for her Omnipod.   PHARMACY:  [facility] [phone] - [place], [place] - [phone] Phone:  [phone]  Fax:  [phone]

## 2020-09-19 MED ORDER — INSULIN SYRINGES (DISPOSABLE) U-100 1 ML MISC: 1.0000 | 6 refills | Status: AC

## 2020-09-19 MED ORDER — INSULIN ASPART 100 UNIT/ML ~~LOC~~ SOLN: SUBCUTANEOUS | 11 refills | Status: DC

## 2020-09-19 NOTE — Telephone Encounter (Signed)
Patient left a message on our voice mail requesting a prescription for insulin to go into her Omnipod to be sent to [facility] on [address].    I called patient and told her that the message has been sent to her MD.  Oran Rein, RD, LDN, CDCES

## 2020-09-19 NOTE — Telephone Encounter (Signed)
Please advise 

## 2020-09-20 ENCOUNTER — Ambulatory Visit (HOSPITAL_COMMUNITY): Payer: 59 | Attending: Cardiology

## 2020-09-20 ENCOUNTER — Other Ambulatory Visit: Payer: Self-pay

## 2020-09-20 DIAGNOSIS — R0602 Shortness of breath: Secondary | ICD-10-CM | POA: Diagnosis present

## 2020-09-20 DIAGNOSIS — R600 Localized edema: Secondary | ICD-10-CM | POA: Insufficient documentation

## 2020-09-20 LAB — ECHOCARDIOGRAM COMPLETE
Area-P 1/2: 3.21 cm2
S' Lateral: 3 cm

## 2020-10-12 ENCOUNTER — Other Ambulatory Visit: Payer: Self-pay | Admitting: Cardiology

## 2020-10-14 NOTE — Telephone Encounter (Signed)
This is Dr. Schumann's pt 

## 2020-10-17 NOTE — Progress Notes (Signed)
Cardiology Office Note:    Date:  10/18/2020   ID:  [name], DOB [birth year], MRN [id number]  PCP:  [name], MD  Cardiologist:  Little Ishikawa, MD  Electrophysiologist:  None   Referring MD: [name], *   Chief Complaint  Patient presents with  . Follow-up    6 weeks.  . Edema   History of Present Illness:    [name] is a 49 y.o. female with a hx of coronary artery disease status post CABG x4 on 10/24/2019 (LIMA-LAD, radial-diagonal, SVG-diagonal, SVG-PDA), type 2 diabetes, hypertension, hyperlipidemia who presents for a follow-up evaluation.  Patient was admitted to [facility] on 06/13/19 with chest pain.  EKG was unremarkable and high-sensitivity troponins were negative.  However due to her extensive risk factors including uncontrolled diabetes, coronary CTA was ordered.  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  She was discharged on medical management: atorvastatin 80 mg, Imdur 30 mg.  TTE showed EF 60-65%.  During subsequent office visits, her antianginal regimen was titrated as she continued to report chest pain.  At last clinic visit on 10/02/2019, she was on metoprolol 25 mg twice daily and Imdur 60 mg daily, but further titration was limited by low blood pressures.  Given inability to further titrate up antianginals and having persistent chest pain, cardiac catheterization was ordered.  Underwent cath on 10/06/2019, which showed three-vessel CAD: 80% mid LAD, 85% slitlike ostial D1, 80% ostial D2, 80% small PDA, anomalous circumflex off the RCA cusp with moderate diffuse disease.  Recommendation was for aggressive medical management and  if symptoms persist consider surgical consultation.  She continued to have chest pain despite medical management, so was referred to cardiac surgery.  She underwent CABG x4 on 10/24/2019 (LIMA-LAD, radial-diagonal, SVG-diagonal, SVG-PDA) with Dr. Donata Clay.  Echocardiogram on 09/20/2018 showed biventricular function, no significant valvular disease.  Since last clinic visit, she reports that she has been doing okay.  She has been taking Lasix 20 mg daily.  She does note swelling in her legs if she does not take it.  She reports left-sided chest pain under her breast, feels like weight chest.  Occurs when stressed or anxious.  She has not been exercising.  Does report dyspnea with minimal exertion.  She has had some lightheadedness recently, but correlates with low blood sugar.  Denies any syncope.  Wt Readings from Last 3 Encounters:  10/18/20 270 lb (122.5 kg)  08/27/20 265 lb 9.6 oz (120.5 kg)  08/12/20 265 lb 4 oz (120.3 kg)     Past Medical History:  Diagnosis Date  . Anxiety   . Arthritis    fingers  . Cervical dysplasia   . Coronary artery disease   . Depression   . Diabetes mellitus    Type II  . Dyspnea 11/22/2013  . GERD (gastroesophageal reflux disease)   . High cholesterol   . History of kidney stones    passed  . Hypertension    no meds now  . Kidney stones   . Moderate episode of recurrent major depressive disorder (HCC) 12/28/2019  . Neuropathy    feet and legs  . Nuclear sclerotic cataract of both eyes 09/2019  Dr. Shea Evans  . PONV (postoperative nausea and vomiting)   . Sleep apnea    lost 70lbs no cpap x52yrs now  . Stage 3a chronic kidney disease (HCC) 04/29/2020    Past Surgical History:  Procedure Laterality Date  . CARDIAC CATHETERIZATION     4-43yrs ago  . CERVICAL CONIZATION W/BX N/A 05/04/2018   Procedure: CONIZATION CERVIX WITH BIOPSY - COLD KNIFE;  Surgeon: Hermina Staggers, MD;  Location: [facility];  Service: Gynecology;  Laterality: N/A;   . CHOLECYSTECTOMY    . CORONARY ARTERY BYPASS GRAFT N/A 10/24/2019   Procedure: CORONARY ARTERY BYPASS GRAFTING (CABG) times four, using left radial artery harvested endoscopically and right greater saphenous vein harvested endoscopically.;  Surgeon: Kerin Perna, MD;  Location: [facility] OR;  Service: Open Heart Surgery;  Laterality: N/A;  . ENDOMETRIAL ABLATION     6 years ago  . INCISION AND DRAINAGE     for boil- buttocks  . INCISION AND DRAINAGE Left    Leg, Nephrotoxin fasciotimy  . IRRIGATION AND DEBRIDEMENT ABSCESS Right 03/17/2017   Procedure: IRRIGATION AND DEBRIDEMENT RIGHT THIGH ABSCESS;  Surgeon: Karie Soda, MD;  Location: [facility] ORS;  Service: General;  Laterality: Right;  . LAPAROSCOPIC APPENDECTOMY  10/04/2011   Procedure: APPENDECTOMY LAPAROSCOPIC;  Surgeon: Rulon Abide, DO;  Location: [facility] ORS;  Service: General;  Laterality: N/A;  . LEFT HEART CATH AND CORONARY ANGIOGRAPHY N/A 10/06/2019   Procedure: LEFT HEART CATH AND CORONARY ANGIOGRAPHY;  Surgeon: Swaziland, Peter M, MD;  Location: [facility] INVASIVE CV LAB;  Service: Cardiovascular;  Laterality: N/A;  . RADIAL ARTERY HARVEST Left 10/24/2019   Procedure: Radial Artery Harvest;  Surgeon: Kerin Perna, MD;  Location: [facility] OR;  Service: Open Heart Surgery;  Laterality: Left;  . TEE WITHOUT CARDIOVERSION N/A 10/24/2019   Procedure: TRANSESOPHAGEAL ECHOCARDIOGRAM (TEE);  Surgeon: Donata Clay, Theron Arista, MD;  Location: [facility] OR;  Service: Open Heart Surgery;  Laterality: N/A;    Current Medications: Current Meds  Medication Sig  . acetaminophen (TYLENOL) 500 MG tablet Take 1,000 mg by mouth every 6 (six) hours as needed for moderate pain or headache.  . albuterol (VENTOLIN HFA) 108 (90 Base) MCG/ACT inhaler Inhale 1-2 puffs into the lungs every 6 (six) hours as needed for wheezing or shortness of breath.  Marland Kitchen aspirin 81 MG EC tablet Take 1 tablet (81 mg total) by mouth daily.  Marland Kitchen atorvastatin (LIPITOR) 80 MG tablet TAKE 1 TABLET(80 MG) BY MOUTH  DAILY AT 6 PM  . azelastine (ASTELIN) 0.1 % nasal spray Place 2 sprays into both nostrils 2 (two) times daily.  . Continuous Blood Gluc Sensor (DEXCOM G6 SENSOR) MISC 1 Device by Does not apply route as directed.  . Continuous Blood Gluc Transmit (DEXCOM G6 TRANSMITTER) MISC 1 Device by Does not apply route as directed.  . empagliflozin (JARDIANCE) 25 MG TABS tablet Take 1 tablet (25 mg total) by mouth daily before breakfast.  . esomeprazole (NEXIUM) 20 MG capsule Take 20 mg by mouth daily.   Marland Kitchen ezetimibe (ZETIA) 10 MG tablet Take 10 mg by mouth daily.  Marland Kitchen FLUoxetine (PROZAC) 40 MG capsule TAKE 1 CAPSULE(40 MG) BY MOUTH DAILY  . fluticasone (FLONASE) 50 MCG/ACT nasal spray Place 2 sprays into both nostrils daily as needed for allergies or rhinitis.  Marland Kitchen gabapentin (NEURONTIN) 300 MG capsule TAKE 3 CAPSULES BY MOUTH EVERY MORNING, 1 IN THE AFTERNOON AND 3 AT NIGHT  . glucose blood (TRUE METRIX BLOOD GLUCOSE TEST) test strip  Use as instructed  . insulin aspart (NOVOLOG) 100 UNIT/ML injection Max daily 60 units per pump  . Insulin Disposable Pump (OMNIPOD 10 PACK) MISC 1 Device by Does not apply route as directed.  . Insulin Syringes, Disposable, U-100 1 ML MISC 1 Device by Does not apply route as directed.  . Melatonin 10 MG CAPS Take 10 mg by mouth at bedtime as needed (sleep).  . metFORMIN (GLUCOPHAGE-XR) 500 MG 24 hr tablet Take 2 tablets (1,000 mg total) by mouth daily with breakfast.  . metoprolol succinate (TOPROL-XL) 25 MG 24 hr tablet TAKE 1/2 TABLET BY MOUTH EVERY DAY  . TRUEPLUS SAFETY LANCETS 28G MISC Use as directed  . [DISCONTINUED] furosemide (LASIX) 20 MG tablet Take 1 tablet (20 mg total) by mouth as needed (for ankle swelling).     Allergies:   Sulfa antibiotics and Codeine   Social History   Socioeconomic History  . Marital status: Widowed    Spouse name: Not on file  . Number of children: 1  . Years of education: Not on file  . Highest education level: Not on file   Occupational History  . Occupation: none  Tobacco Use  . Smoking status: Former Smoker    Packs/day: 2.00    Years: 24.00    Pack years: 48.00    Types: E-cigarettes, Cigarettes    Quit date: 04/22/2017    Years since quitting: 3.4  . Smokeless tobacco: Never Used  . Tobacco comment: vape  Vaping Use  . Vaping Use: Some days  . Devices: 1 pod last 3-4 days  Substance and Sexual Activity  . Alcohol use: Yes    Comment: rare  . Drug use: No  . Sexual activity: Yes    Birth control/protection: None  Other Topics Concern  . Not on file  Social History Narrative  . Not on file   Social Determinants of Health   Financial Resource Strain: Not on file  Food Insecurity: Not on file  Transportation Needs: Not on file  Physical Activity: Not on file  Stress: Not on file  Social Connections: Not on file     Family History: The patient's family history includes Aneurysm in her mother; Breast cancer in her paternal grandmother; Congestive Heart Failure in her sister; Heart attack in her father; Heart disease in her sister; Osteoarthritis in her sister and sister; Pancreatic cancer in her mother; Stroke in her father.  ROS:   Please see the history of present illness.     All other systems reviewed and are negative.  EKGs/Labs/Other Studies Reviewed:    The following studies were reviewed today:   EKG:  EKG is ordered today.  The ekg ordered today demonstrates normal sinus rhythm, rate 70, right bundle branch block, no ST abnormalities  TTE 06/14/19:  1. Left ventricular ejection fraction, by visual estimation, is 60 to 65%. The left ventricle has normal function. Normal left ventricular size. There is no left ventricular hypertrophy.  2. Left ventricular diastolic Doppler parameters are consistent with impaired relaxation pattern of LV diastolic filling.  3. Global right ventricle has normal systolic function.The right ventricular size is normal. No increase in right  ventricular wall thickness.  4. Left atrial size was normal.  5. Right atrial size was normal.  6. The mitral valve is normal in structure. No evidence of mitral valve regurgitation. No evidence of mitral stenosis.  7. The tricuspid valve is normal in structure. Tricuspid valve regurgitation was not visualized by color flow Doppler.  8. The aortic valve is normal in structure. Aortic valve regurgitation was not visualized by color flow Doppler. Structurally normal aortic valve, with no evidence of sclerosis or stenosis.  9. The pulmonic valve was normal in structure. Pulmonic valve regurgitation is not visualized by color flow Doppler. 10. The inferior vena cava is normal in size with greater than 50% respiratory variability, suggesting right atrial pressure of 3 mmHg.  Coronary CT 06/15/19: 1. Coronary calcium score of 81. This was 54 percentile for age and sex matched control. 2. 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 Noncalcified plaque in mid LCX appears to cause severe stenosis (70-99%) but vessel is small (<32mm) 3. Calcified plaque in the proximal LAD causes 25-49% stenosis  CTFFR 06/15/19: 1. CT-FFR across lesion in proximal LAD is 0.92, suggesting lesion is not functionally significant 2. CT-FFR across lesion in ostial D2 is 0.62, suggesting functional significance 3. CT-FFR across lesion in anomalous circumflex is 0.65, suggesting functional significance  Cath 10/06/19:  Prox Cx lesion is 60% stenosed.  Mid Cx lesion is 70% stenosed.  RPDA lesion is 80% stenosed.  Mid LM to Ost LAD lesion is 35% stenosed.  1st Diag lesion is 85% stenosed.  2nd Diag lesion is 80% stenosed.  Mid LAD lesion is 80% stenosed.  LV end diastolic pressure is normal.   1. Three vessel CAD.    - 80% mid LAD after the second diagonal    - 85% slit like stenosis at the origin of the first diagonal- best seen in the LAO caudal shot.     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 Normal LVEDP  Plan: reviewed with interventional colleagues. Her anatomy doesn't offer good PCI options except the mid LAD. Treating the ostial disease in the diagonal branches would likely require stenting into the LAD. The LCx and PDA are too small for PCI. I would aggressively treat medically. If symptoms persist I would consider surgical consultation.     Recent Labs: 11/13/2019: Magnesium 1.6 08/27/2020: ALT 16; BNP 57.6; BUN 23; Creatinine, Ser 1.27; Hemoglobin 14.2; Platelets 235; Potassium 5.0; Sodium 139; TSH 2.090  Recent Lipid Panel    Component Value Date/Time   CHOL 152 08/27/2020 1227   TRIG 83 08/27/2020 1227   HDL 76 08/27/2020 1227   CHOLHDL 2.0 08/27/2020 1227   CHOLHDL 5.5 06/14/2019 0009   VLDL 47 (H) 06/14/2019 0009   LDLCALC 60 08/27/2020 1227    Physical Exam:    VS:  BP 106/70 (BP Location: Right Arm, Patient Position: Sitting, Cuff Size: Large)   Pulse 80   Ht  (1.753 m)   Wt 270 lb (122.5 kg)   BMI 39.87 kg/m     Wt Readings from Last 3 Encounters:  10/18/20 270 lb (122.5 kg)  08/27/20 265 lb 9.6 oz (120.5 kg)  08/12/20 265 lb 4 oz (120.3 kg)    GEN:  in no acute distress HEENT: Normal NECK: No JVD LYMPHATICS: No lymphadenopathy CARDIAC: RRR, no murmurs, rubs, gallops RESPIRATORY:  Clear to auscultation without rales, wheezing or rhonchi  ABDOMEN: Soft, non-tender, non-distended MUSCULOSKELETAL:  No edema; No deformity  SKIN: Warm and dry.   NEUROLOGIC:  Alert and oriented x 3 PSYCHIATRIC:  Normal affect   ASSESSMENT:    1. CAD in native artery   2. S/P CABG (coronary artery bypass  graft)   3. Chest pain of uncertain etiology   4. Lower extremity edema   5. Tobacco use   6. Hyperlipidemia, unspecified hyperlipidemia type    PLAN:    Coronary artery disease: three-vessel CAD on cath 10/06/19: 80% mid LAD, 85%  slitlike ostial D1, 80% ostial D2, 80% small PDA, anomalous circumflex off the RCA cusp with moderate diffuse disease.   Status post CABG x4 on 10/24/2019 (LIMA-LAD, radial-diagonal, SVG-diagonal, SVG-PDA).  Normal LV systolic function on echo 06/14/2019. -Continue aspirin 81 mg daily  -Continue atorvastatin 80 mg daily and Zetia 10 mg daily. LDL 60 on 08/19/2020. -Continue Toprol-XL 12.5 mg daily -She is reporting atypical chest pain but does occur when stressed.  Also having dyspnea with minimal exertion, which could represent anginal equivalent.  Recommend evaluation with stress MRI  Lower extremity edema: No structural heart disease on echocardiogram.  No DVT on duplex.Marland Kitchen  BNP was normal.  Has improved with PO lasix 20 mg daily, will continue.  Check BMET/magnesium.  Hyperlipidemia: LDL 204 on 06/14/19.  Started on atorvastatin 80 mg daily and Zetia 10 mg daily.  LDL 60 on 08/19/2020  Tobacco use: smoked 2ppd x 30 years, has quit smoking but now vaping.  Have discussed risks of vaping and cessation strongly recommended  Type 2 diabetes: A1c 10.9 on 08/12/2020, down from 15.1 on 10/20/2019.  On insulin pump, Metformin, Jardiance.  Follows with endocrinology.    RTC in 3 months  Shared Decision Making/Informed Consent The risks [chest pain, shortness of breath, cardiac arrhythmias, dizziness, blood pressure fluctuations, myocardial infarction, stroke/transient ischemic attack, nausea, vomiting, allergic reaction, metallic taste sensation and life-threatening complications (estimated to be 1 in 10,000)], benefits (risk stratification, diagnosing coronary artery disease, treatment guidance) and alternatives of a MRI stress test were discussed in detail with Ms. Henkes and she agrees to proceed.       Medication Adjustments/Labs and Tests Ordered: Current medicines are reviewed at length with the patient today.  Concerns regarding medicines are outlined above.  Orders Placed This Encounter   Procedures  . MR CARDIAC STRESS TEST  . Basic metabolic panel  . Magnesium   Meds ordered this encounter  Medications  . furosemide (LASIX) 20 MG tablet    Sig: Take 1 tablet (20 mg total) by mouth daily.    Dispense:  90 tablet    Refill:  3    Patient Instructions  Medication Instructions:  Continue furosemide (Lasix) 20 mg daily  *If you need a refill on your cardiac medications before your next appointment, please call your pharmacy*   Lab Work: BMET, Mag today  If you have labs (blood work) drawn today and your tests are completely normal, you will receive your results only by: Marland Kitchen MyChart Message (if you have MyChart) OR . A paper copy in the mail If you have any lab test that is abnormal or we need to change your treatment, we will call you to review the results.   Testing/Procedures: Stress MRI at [facility]   Follow-Up: At [facility], you and your health needs are our priority.  As part of our continuing mission to provide you with exceptional heart care, we have created designated Provider Care Teams.  These Care Teams include your primary Cardiologist (physician) and Advanced Practice Providers (APPs -  Physician Assistants and Nurse Practitioners) who all work together to provide you with the care you need, when you need it.  We recommend signing up for the patient portal called "  MyChart".  Sign up information is provided on this After Visit Summary.  MyChart is used to connect with patients for Virtual Visits (Telemedicine).  Patients are able to view lab/test results, encounter notes, upcoming appointments, etc.  Non-urgent messages can be sent to your provider as well.   To learn more about what you can do with MyChart, go to [email or web address].    Your next appointment:   3 month(s)  The format for your next appointment:   In Person  Provider:   Epifanio Lesches, MD       Signed, Little Ishikawa, MD  10/18/2020 11:14 AM     [facility]

## 2020-10-18 ENCOUNTER — Encounter: Payer: Self-pay | Admitting: Cardiology

## 2020-10-18 ENCOUNTER — Ambulatory Visit (INDEPENDENT_AMBULATORY_CARE_PROVIDER_SITE_OTHER): Payer: 59 | Admitting: Cardiology

## 2020-10-18 ENCOUNTER — Other Ambulatory Visit: Payer: Self-pay

## 2020-10-18 VITALS — BP 106/70 | HR 80 | Ht 69.0 in | Wt 270.0 lb

## 2020-10-18 DIAGNOSIS — Z720 Tobacco use: Secondary | ICD-10-CM

## 2020-10-18 DIAGNOSIS — R600 Localized edema: Secondary | ICD-10-CM

## 2020-10-18 DIAGNOSIS — R079 Chest pain, unspecified: Secondary | ICD-10-CM

## 2020-10-18 DIAGNOSIS — I2510 Atherosclerotic heart disease of native coronary artery without angina pectoris: Secondary | ICD-10-CM

## 2020-10-18 DIAGNOSIS — Z951 Presence of aortocoronary bypass graft: Secondary | ICD-10-CM | POA: Diagnosis not present

## 2020-10-18 DIAGNOSIS — E785 Hyperlipidemia, unspecified: Secondary | ICD-10-CM

## 2020-10-18 LAB — BASIC METABOLIC PANEL
BUN/Creatinine Ratio: 18 (ref 9–23)
BUN: 25 mg/dL — ABNORMAL HIGH (ref 6–24)
CO2: 24 mmol/L (ref 20–29)
Calcium: 9.8 mg/dL (ref 8.7–10.2)
Chloride: 100 mmol/L (ref 96–106)
Creatinine, Ser: 1.38 mg/dL — ABNORMAL HIGH (ref 0.57–1.00)
GFR calc Af Amer: 51 mL/min/{1.73_m2} — ABNORMAL LOW (ref >59)
GFR calc non Af Amer: 44 mL/min/{1.73_m2} — ABNORMAL LOW (ref >59)
Glucose: 207 mg/dL — ABNORMAL HIGH (ref 65–99)
Potassium: 4.7 mmol/L (ref 3.5–5.2)
Sodium: 141 mmol/L (ref 134–144)

## 2020-10-18 LAB — MAGNESIUM: Magnesium: 2.1 mg/dL (ref 1.6–2.3)

## 2020-10-18 MED ORDER — FUROSEMIDE 20 MG PO TABS: 20.0000 mg | ORAL_TABLET | Freq: Every day | ORAL | 3 refills | Status: DC

## 2020-10-18 NOTE — Patient Instructions (Signed)
Medication Instructions:  Continue furosemide (Lasix) 20 mg daily  *If you need a refill on your cardiac medications before your next appointment, please call your pharmacy*   Lab Work: BMET, Mag today  If you have labs (blood work) drawn today and your tests are completely normal, you will receive your results only by: Marland Kitchen MyChart Message (if you have MyChart) OR . A paper copy in the mail If you have any lab test that is abnormal or we need to change your treatment, we will call you to review the results.   Testing/Procedures: Stress MRI at [facility]   Follow-Up: At [facility], you and your health needs are our priority.  As part of our continuing mission to provide you with exceptional heart care, we have created designated Provider Care Teams.  These Care Teams include your primary Cardiologist (physician) and Advanced Practice Providers (APPs -  Physician Assistants and Nurse Practitioners) who all work together to provide you with the care you need, when you need it.  We recommend signing up for the patient portal called "MyChart".  Sign up information is provided on this After Visit Summary.  MyChart is used to connect with patients for Virtual Visits (Telemedicine).  Patients are able to view lab/test results, encounter notes, upcoming appointments, etc.  Non-urgent messages can be sent to your provider as well.   To learn more about what you can do with MyChart, go to [email or web address].    Your next appointment:   3 month(s)  The format for your next appointment:   In Person  Provider:   Epifanio Lesches, MD

## 2020-10-21 ENCOUNTER — Encounter: Payer: Self-pay | Admitting: Cardiology

## 2020-10-21 ENCOUNTER — Telehealth: Payer: Self-pay | Admitting: Cardiology

## 2020-10-21 NOTE — Telephone Encounter (Signed)
Left message for patient concerning the 11/05/20 Cardiac Stress MRI at 2:00 pm at [facility]--arrival time is 1:30 pm 1st floor admissions office for check in.  Will mail information to patient and it is also available in My Chart.   Requested patient call with questions or concerns.

## 2020-10-24 ENCOUNTER — Telehealth: Payer: Self-pay | Admitting: Cardiology

## 2020-10-24 ENCOUNTER — Other Ambulatory Visit: Payer: Self-pay

## 2020-10-24 ENCOUNTER — Encounter (HOSPITAL_COMMUNITY): Payer: Self-pay | Admitting: Emergency Medicine

## 2020-10-24 ENCOUNTER — Emergency Department (HOSPITAL_COMMUNITY): Payer: 59

## 2020-10-24 ENCOUNTER — Emergency Department (HOSPITAL_COMMUNITY)
Admission: EM | Admit: 2020-10-24 | Discharge: 2020-10-24 | Disposition: A | Discharge status: Home / Self Care | Payer: 59 | Attending: Emergency Medicine | Admitting: Emergency Medicine

## 2020-10-24 DIAGNOSIS — Z7982 Long term (current) use of aspirin: Secondary | ICD-10-CM | POA: Diagnosis not present

## 2020-10-24 DIAGNOSIS — N1831 Chronic kidney disease, stage 3a: Secondary | ICD-10-CM | POA: Diagnosis not present

## 2020-10-24 DIAGNOSIS — E1122 Type 2 diabetes mellitus with diabetic chronic kidney disease: Secondary | ICD-10-CM | POA: Diagnosis not present

## 2020-10-24 DIAGNOSIS — Z7984 Long term (current) use of oral hypoglycemic drugs: Secondary | ICD-10-CM | POA: Insufficient documentation

## 2020-10-24 DIAGNOSIS — R079 Chest pain, unspecified: Secondary | ICD-10-CM | POA: Insufficient documentation

## 2020-10-24 DIAGNOSIS — I129 Hypertensive chronic kidney disease with stage 1 through stage 4 chronic kidney disease, or unspecified chronic kidney disease: Secondary | ICD-10-CM | POA: Diagnosis not present

## 2020-10-24 DIAGNOSIS — Z951 Presence of aortocoronary bypass graft: Secondary | ICD-10-CM | POA: Diagnosis not present

## 2020-10-24 DIAGNOSIS — Z8616 Personal history of COVID-19: Secondary | ICD-10-CM | POA: Insufficient documentation

## 2020-10-24 DIAGNOSIS — Z794 Long term (current) use of insulin: Secondary | ICD-10-CM | POA: Insufficient documentation

## 2020-10-24 DIAGNOSIS — I2510 Atherosclerotic heart disease of native coronary artery without angina pectoris: Secondary | ICD-10-CM | POA: Diagnosis not present

## 2020-10-24 DIAGNOSIS — Z79899 Other long term (current) drug therapy: Secondary | ICD-10-CM | POA: Diagnosis not present

## 2020-10-24 DIAGNOSIS — Z87891 Personal history of nicotine dependence: Secondary | ICD-10-CM | POA: Diagnosis not present

## 2020-10-24 LAB — CBC
HCT: 43.9 % (ref 36.0–46.0)
Hemoglobin: 14.1 g/dL (ref 12.0–15.0)
MCH: 26.2 pg (ref 26.0–34.0)
MCHC: 32.1 g/dL (ref 30.0–36.0)
MCV: 81.4 fL (ref 80.0–100.0)
Platelets: 227 10*3/uL (ref 150–400)
RBC: 5.39 MIL/uL — ABNORMAL HIGH (ref 3.87–5.11)
RDW: 12.7 % (ref 11.5–15.5)
WBC: 6.4 10*3/uL (ref 4.0–10.5)
nRBC: 0 % (ref 0.0–0.2)

## 2020-10-24 LAB — BASIC METABOLIC PANEL
Anion gap: 11 (ref 5–15)
BUN: 23 mg/dL — ABNORMAL HIGH (ref 6–20)
CO2: 29 mmol/L (ref 22–32)
Calcium: 9.7 mg/dL (ref 8.9–10.3)
Chloride: 99 mmol/L (ref 98–111)
Creatinine, Ser: 1.43 mg/dL — ABNORMAL HIGH (ref 0.44–1.00)
GFR, Estimated: 44 mL/min — ABNORMAL LOW (ref >60)
Glucose, Bld: 153 mg/dL — ABNORMAL HIGH (ref 70–99)
Potassium: 4.4 mmol/L (ref 3.5–5.1)
Sodium: 139 mmol/L (ref 135–145)

## 2020-10-24 LAB — TROPONIN I (HIGH SENSITIVITY)
Troponin I (High Sensitivity): 3 ng/L (ref <18)
Troponin I (High Sensitivity): 2 ng/L (ref <18)

## 2020-10-24 NOTE — Telephone Encounter (Signed)
Spoke with pt, she reports she developed chest pain while driving. She reports by the time she got home it was real bad and she took a NTG. She did not get any relief after 5 min so she took the 2nd. She reports she got immediate relief with the 2nd NTG but the pain came back almost immediately. It is a 6 on scale 1-10 and is located in the center of her chest, through the right breast and up into her jaw. She is feeling racing and skipping of her heart and is SOB but feels it is because of the pain. She states it feels like a pressure and if she were to burp she may feel better. The pain does not change with a deep breath or movement. Patient advise to call 911. Pt agreed with this plan.

## 2020-10-24 NOTE — Discharge Instructions (Addendum)
Cardiology office will call to schedule an appointment for close follow-up in the next week or 2.  If you have not heard from them in the next few days please reach out and follow-up closely.  Return to the ER promptly if your symptoms worsen or if you have any other concern.

## 2020-10-24 NOTE — ED Triage Notes (Signed)
Pt BIB [facility] from home, c/o central chest pain that radiates to the right side of her chest, neck and down her right arm. Pt took 1 nitro with no relief, and a second nitro that relieved the pain, but pain has since returned. Denies shortness of breath, reports mild nausea.

## 2020-10-24 NOTE — Telephone Encounter (Signed)
Pt c/o of Chest Pain: STAT if CP now or developed within 24 hours  1. Are you having CP right now? yes  2. Are you experiencing any other symptoms (ex. SOB, nausea, vomiting, sweating)? Heart is racing and fluttering,   3. How long have you been experiencing CP? 2 hours  4. Is your CP continuous or coming and going? continous  5. Have you taken Nitroglycerin? Yes took 2    Patient states she has been having chest pain for the past 2 hours. She states she took 2 nitroglycerin and after the second it eased the pain, but it is still there. She states she is also having pain up her jaw and has a racing and fluttering heart.

## 2020-10-24 NOTE — ED Notes (Signed)
Pt ambulated to the restroom without assistance

## 2020-10-24 NOTE — ED Provider Notes (Signed)
[facility] EMERGENCY DEPARTMENT Provider Note   CSN: [id number] Arrival date & time: 10/24/20  5537     History Chief Complaint  Patient presents with  . Chest Pain    [name] is a 50 y.o. female.  The history is provided by the patient and medical records. No language interpreter was used.  Chest Pain    50 year old female significant history of diabetes, hypertension, CAD, brought here via EMS from home for evaluation of chest pain.  Patient report approximately 3-4 hours ago she was driving her car talking on her phone when she developed acute onset of pain in her chest.  She described pain as a sharp sensation to her mid chest radiates towards her back as well as up her neck and down her right arm.  Pain was intense, and was persistent for period of time.  She took the first sublingual nitro without adequate relief but however after taking the second nitro her pain subsided.  Shortly after, pain is returned prompting this ER visit.  States that the pain is subsequently diminished while she was in the waiting room.  During this episode she denies any exertional chest pain no lightheadedness, dizziness, diaphoresis, nausea, or increased shortness of breath.  She denies having similar pain like this.  She denies any pleuritic component.  No prior history of PE or DVT.  Denies heartburn.  No complaint of leg swelling or calf pain.  She has had cardiac disease in the past and she did reach out to her cardiologist who recommend patient to come to the ER for evaluation.  Patient has had Covid in the past, denies any Covid symptoms currently.  Past Medical History:  Diagnosis Date  . Anxiety   . Arthritis    fingers  . Cervical dysplasia   . Coronary artery disease   . Depression   . Diabetes mellitus    Type II  . Dyspnea 11/22/2013  . GERD (gastroesophageal reflux disease)   . High cholesterol   . History of kidney stones    passed  . Hypertension    no  meds now  . Kidney stones   . Moderate episode of recurrent major depressive disorder (HCC) 12/28/2019  . Neuropathy    feet and legs  . Nuclear sclerotic cataract of both eyes 09/2019   Dr. Shea Evans  . PONV (postoperative nausea and vomiting)   . Sleep apnea    lost 70lbs no cpap x64yrs now  . Stage 3a chronic kidney disease (HCC) 04/29/2020    Patient Active Problem List   Diagnosis Date Noted  . Stage 3a chronic kidney disease (HCC) 04/29/2020  . COVID-19 virus vaccination declined 04/29/2020  . Influenza vaccination declined 04/29/2020  . Midline sternotomy scar 04/17/2020  . Left rib fracture 03/13/2020  . Moderate episode of recurrent major depressive disorder (HCC) 12/28/2019  . Encounter for post surgical wound check 12/06/2019  . Type 2 diabetes mellitus with diabetic polyneuropathy, with long-term current use of insulin (HCC) 11/16/2019  . Diabetes mellitus (HCC) 11/16/2019  . Type 2 diabetes mellitus with retinopathy, with long-term current use of insulin (HCC) 11/16/2019  . Dyslipidemia 11/16/2019  . Postop check 11/15/2019  . Diabetic retinopathy (HCC) 10/25/2019  . 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 Angina pectoris (HCC) 10/06/2019  . Left-sided chest pain 06/13/2019  . Post-operative state 06/06/2018  . HGSIL (high grade squamous intraepithelial lesion) on Pap smear of cervix 02/25/2018  . Right upper quadrant abdominal pain  12/30/2017  . Polyarthritis 12/30/2017  . OSA (obstructive sleep apnea) 11/22/2013  . Dyspnea 11/22/2013  . Uncontrolled type 2 diabetes mellitus with hyperosmolar nonketotic hyperglycemia (HCC) 07/21/2012  . HTN (hypertension) 07/21/2012  . Smoker 07/21/2012    Past Surgical History:  Procedure Laterality Date  . CARDIAC CATHETERIZATION     4-62yrs ago  . CERVICAL CONIZATION W/BX N/A 05/04/2018   Procedure: CONIZATION CERVIX WITH BIOPSY - COLD KNIFE;  Surgeon: Hermina Staggers, MD;  Location: [facility];  Service: Gynecology;   Laterality: N/A;  . CHOLECYSTECTOMY    . CORONARY ARTERY BYPASS GRAFT N/A 10/24/2019   Procedure: CORONARY ARTERY BYPASS GRAFTING (CABG) times four, using left radial artery harvested endoscopically and right greater saphenous vein harvested endoscopically.;  Surgeon: Kerin Perna, MD;  Location: [facility] OR;  Service: Open Heart Surgery;  Laterality: N/A;  . ENDOMETRIAL ABLATION     6 years ago  . INCISION AND DRAINAGE     for boil- buttocks  . INCISION AND DRAINAGE Left    Leg, Nephrotoxin fasciotimy  . IRRIGATION AND DEBRIDEMENT ABSCESS Right 03/17/2017   Procedure: IRRIGATION AND DEBRIDEMENT RIGHT THIGH ABSCESS;  Surgeon: Karie Soda, MD;  Location: [facility] ORS;  Service: General;  Laterality: Right;  . LAPAROSCOPIC APPENDECTOMY  10/04/2011   Procedure: APPENDECTOMY LAPAROSCOPIC;  Surgeon: Rulon Abide, DO;  Location: [facility] ORS;  Service: General;  Laterality: N/A;  . LEFT HEART CATH AND CORONARY ANGIOGRAPHY N/A 10/06/2019   Procedure: LEFT HEART CATH AND CORONARY ANGIOGRAPHY;  Surgeon: Swaziland, Peter M, MD;  [facility];  Service: Cardiovascular;  Laterality: N/A;  . RADIAL ARTERY HARVEST Left 10/24/2019   Procedure: Radial Artery Harvest;  Surgeon: Kerin Perna, MD;  Location: [facility] OR;  Service: Open Heart Surgery;  Laterality: Left;  . TEE WITHOUT CARDIOVERSION N/A 10/24/2019   Procedure: TRANSESOPHAGEAL ECHOCARDIOGRAM (TEE);  Surgeon: Donata Clay, Theron Arista, MD;  Location: [facility] OR;  Service: Open Heart Surgery;  Laterality: N/A;     OB History    Gravida  3   Para  1   Term  0   Preterm  0   AB  2   Living  1     SAB  2   IAB  0   Ectopic  0   Multiple  0   Live Births  1           Family History  Problem Relation Age of Onset  . Aneurysm Mother   . Pancreatic cancer Mother   . Heart attack Father   . Stroke Father   . Heart disease Sister   . Breast cancer Paternal Grandmother   . Congestive Heart Failure Sister   . Osteoarthritis Sister   .  Osteoarthritis Sister     Social History   Tobacco Use  . Smoking status: Former Smoker    Packs/day: 2.00    Years: 24.00    Pack years: 48.00    Types: E-cigarettes, Cigarettes    Quit date: 04/22/2017    Years since quitting: 3.5  . Smokeless tobacco: Never Used  . Tobacco comment: vape  Vaping Use  . Vaping Use: Some days  . Devices: 1 pod last 3-4 days  Substance Use Topics  . Alcohol use: Yes    Comment: rare  . Drug use: No    Home Medications Prior to Admission medications   Medication Sig Start Date End Date Taking? Authorizing Provider  acetaminophen (TYLENOL) 500 MG tablet  Take 1,000 mg by mouth every 6 (six) hours as needed for moderate pain or headache.    [provider]  albuterol (VENTOLIN HFA) 108 (90 Base) MCG/ACT inhaler Inhale 1-2 puffs into the lungs every 6 (six) hours as needed for wheezing or shortness of breath. 02/20/20   Cathie Hoops, Amy V, PA-C  aspirin 81 MG EC tablet Take 1 tablet (81 mg total) by mouth daily. 08/27/20   Little Ishikawa, MD  atorvastatin (LIPITOR) 80 MG tablet TAKE 1 TABLET(80 MG) BY MOUTH DAILY AT 6 PM 04/29/20   Marcine Matar, MD  azelastine (ASTELIN) 0.1 % nasal spray Place 2 sprays into both nostrils 2 (two) times daily. 02/20/20   Cathie Hoops, Amy V, PA-C  Continuous Blood Gluc Sensor (DEXCOM G6 SENSOR) MISC 1 Device by Does not apply route as directed. 06/20/20   Shamleffer, Konrad Dolores, MD  Continuous Blood Gluc Transmit (DEXCOM G6 TRANSMITTER) MISC 1 Device by Does not apply route as directed. 06/20/20   Shamleffer, Konrad Dolores, MD  empagliflozin (JARDIANCE) 25 MG TABS tablet Take 1 tablet (25 mg total) by mouth daily before breakfast. 08/12/20   Shamleffer, Konrad Dolores, MD  esomeprazole (NEXIUM) 20 MG capsule Take 20 mg by mouth daily.     [provider]  ezetimibe (ZETIA) 10 MG tablet Take 1 tablet (10 mg total) by mouth daily. 10/04/19 04/22/20  Little Ishikawa, MD  ezetimibe (ZETIA) 10 MG tablet  Take 10 mg by mouth daily.    [provider]  FLUoxetine (PROZAC) 40 MG capsule TAKE 1 CAPSULE(40 MG) BY MOUTH DAILY 04/29/20   Marcine Matar, MD  fluticasone [facility]) 50 MCG/ACT nasal spray Place 2 sprays into both nostrils daily as needed for allergies or rhinitis.    [provider]  furosemide (LASIX) 20 MG tablet Take 1 tablet (20 mg total) by mouth daily. 10/18/20   Little Ishikawa, MD  gabapentin (NEURONTIN) 300 MG capsule TAKE 3 CAPSULES BY MOUTH EVERY MORNING, 1 IN THE AFTERNOON AND 3 AT NIGHT 07/19/20   Marcine Matar, MD  glucose blood (TRUE METRIX BLOOD GLUCOSE TEST) test strip Use as instructed 01/13/18   Marcine Matar, MD  insulin aspart (NOVOLOG) 100 UNIT/ML injection Max daily 60 units per pump 09/19/20   Shamleffer, Konrad Dolores, MD  Insulin Disposable Pump (OMNIPOD 10 PACK) MISC 1 Device by Does not apply route as directed. 06/20/20   Shamleffer, Konrad Dolores, MD  Insulin Syringes, Disposable, U-100 1 ML MISC 1 Device by Does not apply route as directed. 09/19/20   Shamleffer, Konrad Dolores, MD  Melatonin 10 MG CAPS Take 10 mg by mouth at bedtime as needed (sleep).    [provider]  metFORMIN (GLUCOPHAGE-XR) 500 MG 24 hr tablet Take 2 tablets (1,000 mg total) by mouth daily with breakfast. 08/21/20   Shamleffer, Konrad Dolores, MD  metoprolol succinate (TOPROL-XL) 25 MG 24 hr tablet TAKE 1/2 TABLET BY MOUTH EVERY DAY 10/15/20   Little Ishikawa, MD  TRUEPLUS SAFETY LANCETS 28G MISC Use as directed 01/13/18   Marcine Matar, MD    Allergies    Sulfa antibiotics and Codeine  Review of Systems   Review of Systems  Cardiovascular: Positive for chest pain.  All other systems reviewed and are negative.   Physical Exam Updated Vital Signs BP 120/87   Pulse 71   Temp 98 F (36.7 C) (Oral)   Resp 14   LMP  (LMP Unknown)   SpO2 97%  Physical Exam Vitals and nursing note reviewed.  Constitutional:       General: She is not in acute distress.    Appearance: She is well-developed and well-nourished. She is obese.  HENT:     Head: Atraumatic.  Eyes:     Conjunctiva/sclera: Conjunctivae normal.  Cardiovascular:     Rate and Rhythm: Normal rate and regular rhythm.     Pulses: Normal pulses.     Heart sounds: Normal heart sounds.  Pulmonary:     Effort: Pulmonary effort is normal.     Breath sounds: Normal breath sounds. No wheezing, rhonchi or rales.  Chest:     Chest wall: No tenderness.  Abdominal:     Palpations: Abdomen is soft.     Tenderness: There is no abdominal tenderness.  Musculoskeletal:     Cervical back: Neck supple.     Right lower leg: No edema.     Left lower leg: No edema.  Skin:    Findings: No rash.  Neurological:     Mental Status: She is alert and oriented to person, place, and time.  Psychiatric:        Mood and Affect: Mood and affect and mood normal.     ED Results / Procedures / Treatments   Labs (all labs ordered are listed, but only abnormal results are displayed) Labs Reviewed  BASIC METABOLIC PANEL - Abnormal; Notable for the following components:      Result Value   Glucose, Bld 153 (*)    BUN 23 (*)    Creatinine, Ser 1.43 (*)    GFR, Estimated 44 (*)    All other components within normal limits  CBC - Abnormal; Notable for the following components:   RBC 5.39 (*)    All other components within normal limits  TROPONIN I (HIGH SENSITIVITY)  TROPONIN I (HIGH SENSITIVITY)    EKG EKG Interpretation  Date/Time:  Thursday October 24 2020 16:10:36 EST Ventricular Rate:  74 PR Interval:  158 QRS Duration: 122 QT Interval:  404 QTC Calculation: 448 R Axis:   93 Text Interpretation: Normal sinus rhythm Right bundle branch block Abnormal ECG NSR, RBBB unchanged from previous Confirmed by Coralee Pesa [phone]) on 10/24/2020 4:18:09 PM   Radiology DG Chest 2 View  Result Date: 10/24/2020 CLINICAL DATA:  Chest pain EXAM: CHEST - 2 VIEW  COMPARISON:  04/17/2020 FINDINGS: Post sternotomy changes. No focal opacity or pleural effusion. Normal cardiomediastinal silhouette. No pneumothorax. IMPRESSION: No active cardiopulmonary disease. Electronically Signed   By: Jasmine Pang M.D.   On: 10/24/2020 16:38    Procedures Procedures   Medications Ordered in ED Medications - No data to display  ED Course  I have reviewed the triage vital signs and the nursing notes.  Pertinent labs & imaging results that were available during my care of the patient were reviewed by me and considered in my medical decision making (see chart for details).    MDM Rules/Calculators/A&P                          BP 122/79   Pulse 73   Temp 98 F (36.7 C) (Oral)   Resp (!) 21   Ht 5\' 9"  (1.753 m)   Wt 122.4 kg   LMP  (LMP Unknown)   SpO2 97%   BMI 39.85 kg/m   Final Clinical Impression(s) / ED Diagnoses Final diagnoses:  Chest pain, unspecified type    Rx /  DC Orders ED Discharge Orders    None     5:51 PM Patient report having pain to her chest earlier in the day that has since resolved after she took some sublingual nitro.  She does have significant cardiac history as well as cardiac risk factors.  Vital signs stable, reviewed delta troponin.  Symptoms not consistent with PE or dissection.  HEART score of 5, moderate risk of MACE.   7:45 PM Negative delta troponin, patient is resting comfortably.  She prefers to go home.  I did consult with on-call cardiologist who will reach out to her cardiologist office and schedule close follow-up appointment.  Return precaution.  Patient voiced understanding and agrees with plan.  She is stable for discharge.   Fayrene Helper, PA-C 10/24/20 1946    Horton, Clabe Seal, DO 10/25/20 0010

## 2020-10-30 ENCOUNTER — Other Ambulatory Visit: Payer: Self-pay | Admitting: Internal Medicine

## 2020-10-30 DIAGNOSIS — N1832 Chronic kidney disease, stage 3b: Secondary | ICD-10-CM

## 2020-11-01 ENCOUNTER — Telehealth (HOSPITAL_COMMUNITY): Payer: Self-pay | Admitting: *Deleted

## 2020-11-01 NOTE — Telephone Encounter (Signed)
Reaching out to patient to offer assistance regarding upcoming cardiac MRI stress test; pt verbalizes understanding of appt date/time, parking situation and where to check in, and verified current allergies; name and call back number provided for further questions should they arise  Larey Brick RN Navigator Cardiac Imaging [facility] [phone] office [phone] cell  Pt to arrive at the [facility] (entrance C) at 13:15 for a pre-procedure EKG. Pt verbalized understanding.

## 2020-11-04 ENCOUNTER — Telehealth (HOSPITAL_COMMUNITY): Payer: Self-pay | Admitting: *Deleted

## 2020-11-04 ENCOUNTER — Ambulatory Visit
Admission: RE | Admit: 2020-11-04 | Discharge: 2020-11-04 | Disposition: A | Discharge status: Home / Self Care | Payer: 59 | Source: Ambulatory Visit | Attending: Internal Medicine | Admitting: Internal Medicine

## 2020-11-04 ENCOUNTER — Other Ambulatory Visit: Payer: Self-pay | Admitting: Cardiology

## 2020-11-04 DIAGNOSIS — N1832 Chronic kidney disease, stage 3b: Secondary | ICD-10-CM

## 2020-11-04 NOTE — Telephone Encounter (Signed)
Attempted to call patient regarding upcoming cardiac MRI appointment. Left message on voicemail concerning change in appointment for EKG.   Larey Brick RN Navigator Cardiac Imaging [facility] [phone] Office [phone] Cell

## 2020-11-05 ENCOUNTER — Ambulatory Visit (HOSPITAL_COMMUNITY)
Admission: RE | Admit: 2020-11-05 | Discharge: 2020-11-05 | Disposition: A | Discharge status: Home / Self Care | Payer: 59 | Source: Ambulatory Visit | Attending: Emergency Medicine | Admitting: Emergency Medicine

## 2020-11-05 ENCOUNTER — Ambulatory Visit (HOSPITAL_COMMUNITY)
Admission: RE | Admit: 2020-11-05 | Discharge: 2020-11-05 | Disposition: A | Discharge status: Home / Self Care | Payer: 59 | Source: Ambulatory Visit | Attending: Cardiology | Admitting: Cardiology

## 2020-11-05 ENCOUNTER — Other Ambulatory Visit: Payer: Self-pay

## 2020-11-05 ENCOUNTER — Other Ambulatory Visit (HOSPITAL_COMMUNITY): Payer: 59

## 2020-11-05 DIAGNOSIS — R079 Chest pain, unspecified: Secondary | ICD-10-CM | POA: Insufficient documentation

## 2020-11-05 DIAGNOSIS — I2510 Atherosclerotic heart disease of native coronary artery without angina pectoris: Secondary | ICD-10-CM

## 2020-11-05 DIAGNOSIS — Z951 Presence of aortocoronary bypass graft: Secondary | ICD-10-CM

## 2020-11-05 MED ORDER — REGADENOSON 0.4 MG/5ML IV SOLN
INTRAVENOUS | Status: AC
  Filled 2020-11-05: qty 5

## 2020-11-05 MED ORDER — AMINOPHYLLINE 25 MG/ML IV (NUC MED): 75.0000 mg | Freq: Once | INTRAVENOUS | Status: DC

## 2020-11-05 MED ORDER — REGADENOSON 0.4 MG/5ML IV SOLN: 0.4000 mg | Freq: Once | INTRAVENOUS | Status: DC

## 2020-11-05 MED ORDER — AMINOPHYLLINE 25 MG/ML IV SOLN
INTRAVENOUS | Status: AC
  Filled 2020-11-05: qty 10

## 2020-11-05 MED ORDER — GADOBUTROL 1 MMOL/ML IV SOLN: 10.0000 mL | Freq: Once | INTRAVENOUS | Status: DC | PRN

## 2020-11-05 NOTE — Progress Notes (Signed)
0.4mg  lexiscan given over 10 seconds with 30 cc flushed after.  Pt tolerated administration without incident.   Rockwell Alexandria RN Navigator Cardiac Imaging [facility] [phone] Office  [phone] Cell

## 2020-11-06 NOTE — Telephone Encounter (Signed)
Spoke with patient, discussed stress MRI yesterday.  On my preliminary read, no evidence of ischemia

## 2020-11-08 ENCOUNTER — Other Ambulatory Visit: Payer: Self-pay

## 2020-11-11 ENCOUNTER — Encounter: Payer: Self-pay | Admitting: Internal Medicine

## 2020-11-11 ENCOUNTER — Ambulatory Visit (INDEPENDENT_AMBULATORY_CARE_PROVIDER_SITE_OTHER): Payer: 59 | Admitting: Internal Medicine

## 2020-11-11 ENCOUNTER — Other Ambulatory Visit: Payer: Self-pay

## 2020-11-11 ENCOUNTER — Telehealth: Payer: Self-pay | Admitting: Internal Medicine

## 2020-11-11 VITALS — BP 110/72 | HR 82 | Ht 69.0 in | Wt 273.5 lb

## 2020-11-11 DIAGNOSIS — E1165 Type 2 diabetes mellitus with hyperglycemia: Secondary | ICD-10-CM

## 2020-11-11 DIAGNOSIS — E1142 Type 2 diabetes mellitus with diabetic polyneuropathy: Secondary | ICD-10-CM | POA: Diagnosis not present

## 2020-11-11 DIAGNOSIS — Z794 Long term (current) use of insulin: Secondary | ICD-10-CM | POA: Insufficient documentation

## 2020-11-11 DIAGNOSIS — E1159 Type 2 diabetes mellitus with other circulatory complications: Secondary | ICD-10-CM | POA: Diagnosis not present

## 2020-11-11 DIAGNOSIS — E11319 Type 2 diabetes mellitus with unspecified diabetic retinopathy without macular edema: Secondary | ICD-10-CM

## 2020-11-11 DIAGNOSIS — K30 Functional dyspepsia: Secondary | ICD-10-CM | POA: Diagnosis not present

## 2020-11-11 LAB — POCT GLYCOSYLATED HEMOGLOBIN (HGB A1C): Hemoglobin A1C: 9.9 % — AB (ref 4.0–5.6)

## 2020-11-11 MED ORDER — PREGABALIN 300 MG PO CAPS: 300.0000 mg | ORAL_CAPSULE | Freq: Two times a day (BID) | ORAL | 4 refills | Status: DC

## 2020-11-11 NOTE — Progress Notes (Signed)
Name: [name]  Age/ Sex: 50 y.o., female   MRN/ DOB: [id number], [birth year]     PCP: [name], MD   Reason for Endocrinology Evaluation: Type 2 Diabetes Mellitus  Initial Endocrine Consultative Visit: 11/16/2019    PATIENT IDENTIFIER: [name] is a 50 y.o. female with a past medical history of CAD, T2DM and HTN. The patient has followed with Endocrinology clinic since 11/16/2019 for consultative assistance with management of her diabetes.  DIABETIC HISTORY:  [name] was diagnosed with DM in 2004. She is intolerant to higher doses of metformin, nor to Trulicity/ Victoza  due to diarrhea. She was able to tolerate Victoza which was started 05/2019. Her hemoglobin A1c has ranged from 7.2% in 2013, peaking at >15.0% in 2019.    On her initial visit to our clinic she had an A1c of 15.1% . She was on Levemir, Metformin  And victoza and we added [place].   Victoza was stopped 05/2020 due to "severe nausea"  She was started on the Omnipod 07/2020   Victoza stopped 05/2020 due to nausea and diarrhea  SUBJECTIVE:   During the last visit (08/12/2020): A1c 10.9 %.We  continued  metformin and Jardiance and adjusted pump setting       Today (11/11/2020): [name] is here for a follow up on diabetes management.  She checks her blood sugars multiple times a day through the Dexcom    She has been bloated with a lot of gas Denies nausea but has occasional diarrhea  Neuropathy is worsening  Follows with nephrology       This patient with type 2 diabetes is treated with Omnipod (insulin pump). During the visit the pump basal and bolus doses were reviewed including carb/insulin rations and supplemental doses. The clinical list was updated. The glucose meter download was reviewed in detail to determine if the current pump settings are providing the best glycemic control without excessive hypoglycemia.    Pump and meter download:    Pump   Omnipod  Settings   Insulin type   NOVOLOG    Basal rate       0000 1.35 u/h               I:C ratio       0000 1:10                  Sensitivity       0000  25      Goal       0000  110-120            Type & Model of Pump: Omnipod Insulin Type: Currently using Novolog   Body mass index is 40.39 kg/m.  PUMP STATISTICS: Average BG: 290 BG Readings: (1.5 / day) Average Daily Carbs (g): 70.5  Average Total Daily Insulin: 46.1  Average Daily Basal: 31.3 (64 %) Average Daily Bolus: 17.7 (36 %)      HOME DIABETES REGIMEN:  Metformin 500 mg XR , 2 tablet with breakfast  Jardiance 25 mg daily       Statin: Yes ACE-I/ARB: no     CONTINUOUS GLUCOSE MONITORING RECORD INTERPRETATION    Dates of Recording: 3/1-3/14/2022  Sensor description:Dexcom  Results statistics:   CGM use % of time 71  Average and SD 266/65  Time in range    9   %  % Time Above 180 34  % Time above 250 57  % Time Below target 0  Glycemic patterns summary: Hyperglycemia all day and night   Hyperglycemic episodes  All day and night   Hypoglycemic episodes occurred N/A  Overnight periods: high       DIABETIC COMPLICATIONS: Microvascular complications:   Neuropathy, retinopathy  Denies: CKD  Last Eye Exam: Completed   Macrovascular complications:   CAD ( S/P CABG 10/2019)  Denies:  CVA, PVD   HISTORY:  Past Medical History:  Past Medical History:  Diagnosis Date  . Anxiety   . Arthritis    fingers  . Cervical dysplasia   . Coronary artery disease   . Depression   . Diabetes mellitus    Type II  . Dyspnea 11/22/2013  . GERD (gastroesophageal reflux disease)   . High cholesterol   . History of kidney stones    passed  . Hypertension    no meds now  . Kidney stones   . Moderate episode of recurrent major depressive disorder (HCC) 12/28/2019  . Neuropathy    feet and legs  . Nuclear sclerotic cataract of both eyes 09/2019   Dr. Shea Evans  . PONV  (postoperative nausea and vomiting)   . Sleep apnea    lost 70lbs no cpap x47yrs now  . Stage 3a chronic kidney disease (HCC) 04/29/2020   Past Surgical History:  Past Surgical History:  Procedure Laterality Date  . CARDIAC CATHETERIZATION     4-75yrs ago  . CERVICAL CONIZATION W/BX N/A 05/04/2018   Procedure: CONIZATION CERVIX WITH BIOPSY - COLD KNIFE;  Surgeon: Hermina Staggers, MD;  Location: [facility];  Service: Gynecology;  Laterality: N/A;  . CHOLECYSTECTOMY    . CORONARY ARTERY BYPASS GRAFT N/A 10/24/2019   Procedure: CORONARY ARTERY BYPASS GRAFTING (CABG) times four, using left radial artery harvested endoscopically and right greater saphenous vein harvested endoscopically.;  Surgeon: Kerin Perna, MD;  Location: [facility] OR;  Service: Open Heart Surgery;  Laterality: N/A;  . ENDOMETRIAL ABLATION     6 years ago  . INCISION AND DRAINAGE     for boil- buttocks  . INCISION AND DRAINAGE Left    Leg, Nephrotoxin fasciotimy  . IRRIGATION AND DEBRIDEMENT ABSCESS Right 03/17/2017   Procedure: IRRIGATION AND DEBRIDEMENT RIGHT THIGH ABSCESS;  Surgeon: Karie Soda, MD;  Location: [facility] ORS;  Service: General;  Laterality: Right;  . LAPAROSCOPIC APPENDECTOMY  10/04/2011   Procedure: APPENDECTOMY LAPAROSCOPIC;  Surgeon: Rulon Abide, DO;  Location: [facility]S;  Service: General;  Laterality: N/A;  . LEFT HEART CATH AND CORONARY ANGIOGRAPHY N/A 10/06/2019   Procedure: LEFT HEART CATH AND CORONARY ANGIOGRAPHY;  Surgeon: Swaziland, Peter M, MD;  Location[facility] INVASIVE CV LAB;  Service: Cardiovascular;  Laterality: N/A;  . RADIAL ARTERY HARVEST Left 10/24/2019   Procedure: Radial Artery Harvest;  Surgeon: Kerin Perna, MD;  [facility];  Service: Open Heart Surgery;  Laterality: Left;  . TEE WITHOUT CARDIOVERSION N/A 10/24/2019   Procedure: TRANSESOPHAGEAL ECHOCARDIOGRAM (TEE);  Surgeon: Donata Clay, Theron Arista, MD;  Location: [facility] OR;  Service: Open Heart Surgery;  Laterality: N/A;    Social  History:  reports that she quit smoking about 3 years ago. Her smoking use included e-cigarettes and cigarettes. She has a 48.00 pack-year smoking history. She has never used smokeless tobacco. She reports current alcohol use. She reports that she does not use drugs. Family History:  Family History  Problem Relation Age of Onset  . Aneurysm Mother   . Pancreatic cancer Mother   . Heart attack Father   .  Stroke Father   . Heart disease Sister   . Breast cancer Paternal Grandmother   . Congestive Heart Failure Sister   . Osteoarthritis Sister   . Osteoarthritis Sister      HOME MEDICATIONS: Allergies as of 11/11/2020      Reactions   Sulfa Antibiotics Hives   Codeine Itching      Medication List       Accurate as of November 11, 2020  9:29 AM. If you have any questions, ask your nurse or doctor.        acetaminophen 500 MG tablet Commonly known as: TYLENOL Take 1,000 mg by mouth every 6 (six) hours as needed for moderate pain or headache.   albuterol 108 (90 Base) MCG/ACT inhaler Commonly known as: VENTOLIN HFA Inhale 1-2 puffs into the lungs every 6 (six) hours as needed for wheezing or shortness of breath.   aspirin 81 MG EC tablet Take 1 tablet (81 mg total) by mouth daily.   atorvastatin 80 MG tablet Commonly known as: LIPITOR TAKE 1 TABLET(80 MG) BY MOUTH DAILY AT 6 PM What changed:   how much to take  how to take this  when to take this  additional instructions   Dexcom G6 Sensor Misc 1 Device by Does not apply route as directed.   Dexcom G6 Transmitter Misc 1 Device by Does not apply route as directed.   empagliflozin 25 MG Tabs tablet Commonly known as: Jardiance Take 1 tablet (25 mg total) by mouth daily before breakfast.   esomeprazole 20 MG capsule Commonly known as: NEXIUM Take 20 mg by mouth daily.   ezetimibe 10 MG tablet Commonly known as: ZETIA Take 1 tablet (10 mg total) by mouth daily. What changed: when to take this   FLUoxetine 40  MG capsule Commonly known as: PROZAC TAKE 1 CAPSULE(40 MG) BY MOUTH DAILY What changed:   how much to take  how to take this  when to take this   fluticasone 50 MCG/ACT nasal spray Commonly known as: FLONASE Place 2 sprays into both nostrils daily as needed for allergies or rhinitis.   furosemide 20 MG tablet Commonly known as: LASIX Take 1 tablet (20 mg total) by mouth daily.   gabapentin 300 MG capsule Commonly known as: NEURONTIN TAKE 3 CAPSULES BY MOUTH EVERY MORNING, 1 IN THE AFTERNOON AND 3 AT NIGHT What changed:   how much to take  how to take this  when to take this   glucose blood test strip Commonly known as: True Metrix Blood Glucose Test Use as instructed   insulin aspart 100 UNIT/ML injection Commonly known as: NovoLOG Max daily 60 units per pump   Insulin Syringes (Disposable) U-100 1 ML Misc 1 Device by Does not apply route as directed.   lisinopril 2.5 MG tablet Commonly known as: ZESTRIL Take 2.5 mg by mouth daily.   Melatonin 10 MG Caps Take 10 mg by mouth at bedtime as needed (sleep).   metFORMIN 500 MG 24 hr tablet Commonly known as: GLUCOPHAGE-XR Take 2 tablets (1,000 mg total) by mouth daily with breakfast.   metoprolol succinate 25 MG 24 hr tablet Commonly known as: TOPROL-XL TAKE 1/2 TABLET BY MOUTH EVERY DAY   nitroGLYCERIN 0.4 MG SL tablet Commonly known as: NITROSTAT Place 0.4 mg under the tongue every 5 (five) minutes as needed for chest pain.   omeprazole 20 MG capsule Commonly known as: PRILOSEC Take 20 mg by mouth daily.   OmniPod 10 Pack Misc 1  Device by Does not apply route as directed.   TRUEplus Safety Lancets 28G Misc Use as directed        OBJECTIVE:   Vital Signs: BP 110/72   Pulse 82   Ht  (1.753 m)   Wt 273 lb 8 oz (124.1 kg)   LMP  (LMP Unknown)   SpO2 98%   BMI 40.39 kg/m    Wt Readings from Last 3 Encounters:  11/11/20 273 lb 8 oz (124.1 kg)  10/24/20 269 lb 13.5 oz (122.4 kg)   10/18/20 270 lb (122.5 kg)     Exam: General: Pt appears well and is in NAD  Neck: General: Supple without adenopathy. Thyroid: Thyroid size normal.  No goiter or nodules appreciated.  Lungs: Clear with good BS bilat with no rales, rhonchi, or wheezes  Heart: RRR with normal S1 and S2 and no gallops; no murmurs; no rub  Abdomen: Normoactive bowel sounds, soft, nontender, without masses or organomegaly palpable  Extremities: 1+  pretibial edema on the left and trace on the right   Neuro: MS is good with appropriate affect, pt is alert and Ox3     DM foot exam: 11/16/2019  The skin of the feet is intact without sores or ulcerations. The pedal pulses are undetectable on today's exam  The sensation is decreased to a screening 5.07, 10 gram monofilament bilaterally   DATA REVIEWED:  Lab Results  Component Value Date   HGBA1C 9.9 (A) 11/11/2020   HGBA1C 10.9 (A) 08/12/2020   HGBA1C 15.1 (H) 10/20/2019   Lab Results  Component Value Date   MICROALBUR <0.7 08/12/2020   LDLCALC 60 08/27/2020   CREATININE 1.43 (H) 10/24/2020   Lab Results  Component Value Date   MICRALBCREAT 2.7 08/12/2020     Lab Results  Component Value Date   CHOL 152 08/27/2020   HDL 76 08/27/2020   LDLCALC 60 08/27/2020   TRIG 83 08/27/2020   CHOLHDL 2.0 08/27/2020       Results for [name], [name] (MRN [id number]) as of 08/12/2020 13:31  Ref. Range 08/12/2020 10:08  Sodium Latest Ref Range: 135 - 145 mEq/L 138  Potassium Latest Ref Range: 3.5 - 5.1 mEq/L 4.6  Chloride Latest Ref Range: 96 - 112 mEq/L 103  CO2 Latest Ref Range: 19 - 32 mEq/L 28  Glucose Latest Ref Range: 70 - 99 mg/dL 272 (H)  BUN Latest Ref Range: 6 - 23 mg/dL 21  Creatinine Latest Ref Range: 0.40 - 1.20 mg/dL 5.36 (H)  Calcium Latest Ref Range: 8.4 - 10.5 mg/dL 9.4  GFR Latest Ref Range: >60.00 mL/min 51.91 (L)  MICROALB/CREAT RATIO Latest Ref Range: 0.0 - 30.0 mg/g 2.7     ASSESSMENT / PLAN / RECOMMENDATIONS:   1)  Type 2 Diabetes Mellitus, Poorly controlled, With Neuropathic, retinopathic and macrovascular complications - Most recent A1c of 9.9 %. Goal A1c < 7.0 %.    - Her A1c continues to trend down , but there's room for improvement.  - She has not been bolusing as much, pt assures me she only eats 1-2 meals a day, but she only eneters ~ 70 grams a day ?? - I will make adjustments to her basal rate and I: C ratio as below      MEDICATIONS: - Continue Metformin 500 mg XR , 2 tablet with breakfast  - Continue Jardiance 25 mg , 1 tablet daily with Breakfast     Pump   Omnipod Settings   Insulin  type   NOVOLOG    Basal rate       0000 1.65 u/h               I:C ratio       0000 1:8                  Sensitivity       0000  25      Goal       0000  110-120    EDUCATION / INSTRUCTIONS:  BG monitoring instructions: Patient is instructed to check her blood sugars 4 times a day, preprandial and bedtime  Call [facility] if: BG persistently < 70  . I reviewed the Rule of 15 for the treatment of hypoglycemia in detail with the patient. Literature supplied.     2) Diabetic complications:   Eye: Does  have known diabetic retinopathy.   Neuro/ Feet: Does have known diabetic peripheral neuropathy .   Renal: Patient does not have known baseline CKD. She   is not on an ACEI/ARB at present.   3) Peripheral neuropathy   -  Will switch Gabapentin to Lyrica, cautioned against drowsiness - She may use Capsaicin cream for breakthrough pain    4) Indigestion:  - Pt to discuss with PCP . We stopped GLP-1 agonists and reduced Metformin by 50% due to GI issues but she continues to have bloating and gassiness and attributes this to INsulin. I have advised her to discuss with PCP as she may have a separate GI issue and I hate for her to lose benefit on medications which its not the cause  - Pt expressed understanding   F/U in 3 months    Signed electronically by: Lyndle Herrlich, MD  [facility] Endocrinology  [facility] [address] [place] [place], [place] [address] Phone: [phone] FAX: [phone]   CC: [name], MD [address] [place] [address] Phone: [phone]  Fax: [phone]  Return to Endocrinology clinic as below: Future Appointments  Date Time Provider Department Center  11/11/2020  9:30 AM Shamleffer, Konrad Dolores, MD [facility] None  11/22/2020  9:20 AM Little Ishikawa, MD [facility]  01/15/2021 10:20 AM Little Ishikawa, MD [facility]  06/03/2021  1:00 PM Pollyann Savoy, MD [facility] None

## 2020-11-11 NOTE — Patient Instructions (Addendum)
-   Keep up the good Work ! - Continue Metformin 500 mg XR , 2 tablet with breakfast  - Continue  Jardiance 25 mg , 1 tablet daily with Breakfast  - STOP Gabapentin  - Start Lyrica 300 mg Twice daily - this could cause drowsiness    Pump   Omnipod Settings   Insulin type   NOVOLOG    Basal rate       0000 1.65 u/h               I:C ratio       0000 1:8                  Sensitivity       0000  25      Goal       0000  110-120      HOW TO TREAT LOW BLOOD SUGARS (Blood sugar LESS THAN 70 MG/DL)  Please follow the RULE OF 15 for the treatment of hypoglycemia treatment (when your (blood sugars are less than 70 mg/dL)    STEP 1: Take 15 grams of carbohydrates when your blood sugar is low, which includes:   3-4 GLUCOSE TABS  OR  3-4 OZ OF JUICE OR REGULAR SODA OR  ONE TUBE OF GLUCOSE GEL     STEP 2: RECHECK blood sugar in 15 MINUTES STEP 3: If your blood sugar is still low at the 15 minute recheck --> then, go back to STEP 1 and treat AGAIN with another 15 grams of carbohydrates.

## 2020-11-11 NOTE — Telephone Encounter (Signed)
[name],    When you get a chance, can you please call the pt , she has an Omnipod and is wondering how much she could put in the POD to last her 3 days ?  I have increased her pump settings    Thanks    Abby Raelyn Mora, MD  [facility] Endocrinology  [facility] [address] [place], [place] [address] Phone: [phone] FAX: [phone]

## 2020-11-12 NOTE — Telephone Encounter (Signed)
Patient says she is taking approximately 5u for breakfast, 7 for lunch and 10 for super, plus corrections.  Her basal rate is 39u/day, so without correction doses, she will use 183u in 3 days.  She was told to fill the cartridge with 200u-the maximum, and that this should last her almost 3 days.   She says she changed her settings last night per Dr. Harvel Ricks orders and FBS today was 250.  She was told to wait 3 days, and if still high, to call back.  She agreed to do this, and had no final questions.

## 2020-11-22 ENCOUNTER — Encounter: Payer: Self-pay | Admitting: Cardiology

## 2020-11-22 ENCOUNTER — Ambulatory Visit (INDEPENDENT_AMBULATORY_CARE_PROVIDER_SITE_OTHER): Payer: 59 | Admitting: Cardiology

## 2020-11-22 ENCOUNTER — Other Ambulatory Visit: Payer: Self-pay

## 2020-11-22 VITALS — BP 127/62 | HR 74 | Ht 68.0 in | Wt 275.0 lb

## 2020-11-22 DIAGNOSIS — Z79899 Other long term (current) drug therapy: Secondary | ICD-10-CM | POA: Diagnosis not present

## 2020-11-22 DIAGNOSIS — R600 Localized edema: Secondary | ICD-10-CM

## 2020-11-22 DIAGNOSIS — Z951 Presence of aortocoronary bypass graft: Secondary | ICD-10-CM | POA: Diagnosis not present

## 2020-11-22 DIAGNOSIS — I2510 Atherosclerotic heart disease of native coronary artery without angina pectoris: Secondary | ICD-10-CM

## 2020-11-22 DIAGNOSIS — E785 Hyperlipidemia, unspecified: Secondary | ICD-10-CM

## 2020-11-22 DIAGNOSIS — Z720 Tobacco use: Secondary | ICD-10-CM

## 2020-11-22 LAB — BASIC METABOLIC PANEL
BUN/Creatinine Ratio: 25 — ABNORMAL HIGH (ref 9–23)
BUN: 36 mg/dL — ABNORMAL HIGH (ref 6–24)
CO2: 23 mmol/L (ref 20–29)
Calcium: 9.5 mg/dL (ref 8.7–10.2)
Chloride: 101 mmol/L (ref 96–106)
Creatinine, Ser: 1.42 mg/dL — ABNORMAL HIGH (ref 0.57–1.00)
Glucose: 117 mg/dL — ABNORMAL HIGH (ref 65–99)
Potassium: 4.4 mmol/L (ref 3.5–5.2)
Sodium: 141 mmol/L (ref 134–144)
eGFR: 45 mL/min/{1.73_m2} — ABNORMAL LOW (ref >59)

## 2020-11-22 LAB — MAGNESIUM: Magnesium: 2.1 mg/dL (ref 1.6–2.3)

## 2020-11-22 LAB — SPECIMEN STATUS REPORT

## 2020-11-22 NOTE — Patient Instructions (Signed)
Medication Instructions:  Your physician recommends that you continue on your current medications as directed. Please refer to the Current Medication list given to you today.  *If you need a refill on your cardiac medications before your next appointment, please call your pharmacy*   Lab Work: BMET, Mag today  If you have labs (blood work) drawn today and your tests are completely normal, you will receive your results only by: MyChart Message (if you have MyChart) OR A paper copy in the mail If you have any lab test that is abnormal or we need to change your treatment, we will call you to review the results.  Follow-Up: At [facility], you and your health needs are our priority.  As part of our continuing mission to provide you with exceptional heart care, we have created designated Provider Care Teams.  These Care Teams include your primary Cardiologist (physician) and Advanced Practice Providers (APPs -  Physician Assistants and Nurse Practitioners) who all work together to provide you with the care you need, when you need it.  We recommend signing up for the patient portal called "MyChart".  Sign up information is provided on this After Visit Summary.  MyChart is used to connect with patients for Virtual Visits (Telemedicine).  Patients are able to view lab/test results, encounter notes, upcoming appointments, etc.  Non-urgent messages can be sent to your provider as well.   To learn more about what you can do with MyChart, go to [email or web address].    Your next appointment:   6 month(s)  The format for your next appointment:   In Person  Provider:   Christopher Schumann, MD     

## 2020-11-22 NOTE — Progress Notes (Signed)
Cardiology Office Note:    Date:  11/24/2020   ID:  MARIENA MEARES, DOB 1970/12/19, MRN [id number]  PCP:  [name], MD  Cardiologist:  Little Ishikawa, MD  Electrophysiologist:  None   Referring MD: [name], *   Chief Complaint  Patient presents with  . Coronary Artery Disease   History of Present Illness:    [name] is a 50 y.o. female with a hx of coronary artery disease status post CABG x4 on 10/24/2019 (LIMA-LAD, radial-diagonal, SVG-diagonal, SVG-PDA), type 2 diabetes, hypertension, hyperlipidemia who presents for a follow-up evaluation.  Patient was admitted to [facility] on 06/13/19 with chest pain.  EKG was unremarkable and high-sensitivity troponins were negative.  However due to her extensive risk factors including uncontrolled diabetes, coronary CTA was ordered.  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  She was discharged on medical management: atorvastatin 80 mg, Imdur 30 mg.  TTE showed EF 60-65%.  During subsequent office visits, her antianginal regimen was titrated as she continued to report chest pain.  At last clinic visit on 10/02/2019, she was on metoprolol 25 mg twice daily and Imdur 60 mg daily, but further titration was limited by low blood pressures.  Given inability to further titrate up antianginals and having persistent chest pain, cardiac catheterization was ordered.  Underwent cath on 10/06/2019, which showed three-vessel CAD: 80% mid LAD, 85% slitlike ostial D1, 80% ostial D2, 80% small PDA, anomalous circumflex off the RCA cusp with moderate diffuse disease.  Recommendation was for aggressive medical management and if  symptoms persist consider surgical consultation.  She continued to have chest pain despite medical management, so was referred to cardiac surgery.  She underwent CABG x4 on 10/24/2019 (LIMA-LAD, radial-diagonal, SVG-diagonal, SVG-PDA) with Dr. Donata Clay.  Echocardiogram on 09/20/2018 showed biventricular function, no significant valvular disease.  Stress CMR on 11/05/2020 showed no evidence of ischemia, no LGE, LVEF 61%, RVEF 60%.  Since last clinic visit, she reports that she is doing well.  States that her chest pain has resolved.  She recently stopped taking gabapentin and reports she feels significantly improved since she came off gabapentin and switched to Lyrica.  She has been taking her Lasix once per day.  Reports dyspnea has improved.  Has cut back on vaping, reports she only does it socially.  Has not been exercising.   Wt Readings from Last 3 Encounters:  11/22/20 275 lb (124.7 kg)  11/11/20 273 lb 8 oz (124.1 kg)  10/24/20 269 lb 13.5 oz (122.4 kg)     Past Medical History:  Diagnosis Date  . Anxiety   . Arthritis    fingers  . Cervical dysplasia   . Coronary artery disease   . Depression   . Diabetes mellitus    Type II  . Dyspnea 11/22/2013  . GERD (gastroesophageal reflux disease)   . High cholesterol   . History of kidney stones    passed  . Hypertension    no meds now  . Kidney stones   . Moderate episode of recurrent major depressive disorder (HCC) 12/28/2019  . Neuropathy    feet and legs  . Nuclear sclerotic cataract of both eyes 09/2019  Dr. Shea Evans  . PONV (postoperative nausea and vomiting)   . Sleep apnea    lost 70lbs no cpap x26yrs now  . Stage 3a chronic kidney disease (HCC) 04/29/2020    Past Surgical History:  Procedure Laterality Date  . CARDIAC CATHETERIZATION     4-15yrs ago  . CERVICAL CONIZATION W/BX N/A 05/04/2018   Procedure: CONIZATION CERVIX WITH BIOPSY - COLD KNIFE;  Surgeon: Hermina Staggers, MD;  Location: [facility];  Service:  Gynecology;  Laterality: N/A;  . CHOLECYSTECTOMY    . CORONARY ARTERY BYPASS GRAFT N/A 10/24/2019   Procedure: CORONARY ARTERY BYPASS GRAFTING (CABG) times four, using left radial artery harvested endoscopically and right greater saphenous vein harvested endoscopically.;  Surgeon: Kerin Perna, MD;  Location: [facility] OR;  Service: Open Heart Surgery;  Laterality: N/A;  . ENDOMETRIAL ABLATION     6 years ago  . INCISION AND DRAINAGE     for boil- buttocks  . INCISION AND DRAINAGE Left    Leg, Nephrotoxin fasciotimy  . IRRIGATION AND DEBRIDEMENT ABSCESS Right 03/17/2017   Procedure: IRRIGATION AND DEBRIDEMENT RIGHT THIGH ABSCESS;  Surgeon: Karie Soda, MD;  Location: [facility] ORS;  Service: General;  Laterality: Right;  . LAPAROSCOPIC APPENDECTOMY  10/04/2011   Procedure: APPENDECTOMY LAPAROSCOPIC;  Surgeon: Rulon Abide, DO;  Location: [facility];  Service: General;  Laterality: N/A;  . LEFT HEART CATH AND CORONARY ANGIOGRAPHY N/A 10/06/2019   Procedure: LEFT HEART CATH AND CORONARY ANGIOGRAPHY;  Surgeon: Swaziland, Peter M, MD;  Location[facility];  Service: Cardiovascular;  Laterality: N/A;  . RADIAL ARTERY HARVEST Left 10/24/2019   Procedure: Radial Artery Harvest;  Surgeon: Kerin Perna, MD;  Location: [facility] OR;  Service: Open Heart Surgery;  Laterality: Left;  . TEE WITHOUT CARDIOVERSION N/A 10/24/2019   Procedure: TRANSESOPHAGEAL ECHOCARDIOGRAM (TEE);  Surgeon: Donata Clay, Theron Arista, MD;  Location: [facility] OR;  Service: Open Heart Surgery;  Laterality: N/A;    Current Medications: Current Meds  Medication Sig  . acetaminophen (TYLENOL) 500 MG tablet Take 1,000 mg by mouth every 6 (six) hours as needed for moderate pain or headache.  . albuterol (VENTOLIN HFA) 108 (90 Base) MCG/ACT inhaler Inhale 1-2 puffs into the lungs every 6 (six) hours as needed for wheezing or shortness of breath.  Marland Kitchen aspirin 81 MG EC tablet Take 1 tablet (81 mg total) by mouth daily.  Marland Kitchen atorvastatin (LIPITOR) 80 MG tablet  TAKE 1 TABLET(80 MG) BY MOUTH DAILY AT 6 PM (Patient taking differently: Take 80 mg by mouth every evening.)  . Continuous Blood Gluc Sensor (DEXCOM G6 SENSOR) MISC 1 Device by Does not apply route as directed.  . Continuous Blood Gluc Transmit (DEXCOM G6 TRANSMITTER) MISC 1 Device by Does not apply route as directed.  . empagliflozin (JARDIANCE) 25 MG TABS tablet Take 1 tablet (25 mg total) by mouth daily before breakfast.  . esomeprazole (NEXIUM) 20 MG capsule Take 20 mg by mouth daily.   Marland Kitchen FLUoxetine (PROZAC) 40 MG capsule TAKE 1 CAPSULE(40 MG) BY MOUTH DAILY (Patient taking differently: Take 40 mg by mouth daily. TAKE 1 CAPSULE(40 MG) BY MOUTH DAILY)  . fluticasone (FLONASE) 50 MCG/ACT nasal spray Place 2 sprays into both nostrils daily as needed for allergies or rhinitis.  . furosemide (LASIX) 20 MG tablet Take 1 tablet (20 mg total) by mouth daily.  Marland Kitchen glucose blood (TRUE METRIX BLOOD GLUCOSE TEST) test strip Use as instructed  . insulin aspart (NOVOLOG) 100 UNIT/ML injection  Max daily 60 units per pump  . Insulin Disposable Pump (OMNIPOD 10 PACK) MISC 1 Device by Does not apply route as directed.  . Insulin Syringes, Disposable, U-100 1 ML MISC 1 Device by Does not apply route as directed.  Marland Kitchen lisinopril (ZESTRIL) 2.5 MG tablet Take 2.5 mg by mouth daily.  . Melatonin 10 MG CAPS Take 10 mg by mouth at bedtime as needed (sleep).  . metFORMIN (GLUCOPHAGE-XR) 500 MG 24 hr tablet Take 2 tablets (1,000 mg total) by mouth daily with breakfast.  . metoprolol succinate (TOPROL-XL) 25 MG 24 hr tablet TAKE 1/2 TABLET BY MOUTH EVERY DAY (Patient taking differently: Take 12.5 mg by mouth daily.)  . nitroGLYCERIN (NITROSTAT) 0.4 MG SL tablet Place 0.4 mg under the tongue every 5 (five) minutes as needed for chest pain.  Marland Kitchen omeprazole (PRILOSEC) 20 MG capsule Take 20 mg by mouth daily.  . pregabalin (LYRICA) 300 MG capsule Take 1 capsule (300 mg total) by mouth 2 (two) times daily.  . TRUEPLUS SAFETY  LANCETS 28G MISC Use as directed     Allergies:   Sulfa antibiotics and Codeine   Social History   Socioeconomic History  . Marital status: Widowed    Spouse name: Not on file  . Number of children: 1  . Years of education: Not on file  . Highest education level: Not on file  Occupational History  . Occupation: none  Tobacco Use  . Smoking status: Former Smoker    Packs/day: 2.00    Years: 24.00    Pack years: 48.00    Types: E-cigarettes, Cigarettes    Quit date: 04/22/2017    Years since quitting: 3.5  . Smokeless tobacco: Never Used  . Tobacco comment: vape  Vaping Use  . Vaping Use: Some days  . Devices: 1 pod last 3-4 days  Substance and Sexual Activity  . Alcohol use: Yes    Comment: rare  . Drug use: No  . Sexual activity: Yes    Birth control/protection: None  Other Topics Concern  . Not on file  Social History Narrative  . Not on file   Social Determinants of Health   Financial Resource Strain: Not on file  Food Insecurity: Not on file  Transportation Needs: Not on file  Physical Activity: Not on file  Stress: Not on file  Social Connections: Not on file     Family History: The patient's family history includes Aneurysm in her mother; Breast cancer in her paternal grandmother; Congestive Heart Failure in her sister; Heart attack in her father; Heart disease in her sister; Osteoarthritis in her sister and sister; Pancreatic cancer in her mother; Stroke in her father.  ROS:   Please see the history of present illness.     All other systems reviewed and are negative.  EKGs/Labs/Other Studies Reviewed:    The following studies were reviewed today:   EKG:  EKG is ordered today.  The ekg ordered today demonstrates normal sinus rhythm, rate 70, right bundle branch block, no ST abnormalities  TTE 06/14/19:  1. Left ventricular ejection fraction, by visual estimation, is 60 to 65%. The left ventricle has normal function. Normal left ventricular size.  There is no left ventricular hypertrophy.  2. Left ventricular diastolic Doppler parameters are consistent with impaired relaxation pattern of LV diastolic filling.  3. Global right ventricle has normal systolic function.The right ventricular size is normal. No increase in right ventricular wall thickness.  4. Left atrial size was normal.  5. Right atrial size was normal.  6. The mitral valve is normal in structure. No evidence of mitral valve regurgitation. No evidence of mitral stenosis.  7. The tricuspid valve is normal in structure. Tricuspid valve regurgitation was not visualized by color flow Doppler.  8. The aortic valve is normal in structure. Aortic valve regurgitation was not visualized by color flow Doppler. Structurally normal aortic valve, with no evidence of sclerosis or stenosis.  9. The pulmonic valve was normal in structure. Pulmonic valve regurgitation is not visualized by color flow Doppler. 10. The inferior vena cava is normal in size with greater than 50% respiratory variability, suggesting right atrial pressure of 3 mmHg.  Coronary CT 06/15/19: 1. Coronary calcium score of 81. This was 53 percentile for age and sex matched control. 2. 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 Noncalcified plaque in mid LCX appears to cause severe stenosis (70-99%) but vessel is small (<21mm) 3. Calcified plaque in the proximal LAD causes 25-49% stenosis  CTFFR 06/15/19: 1. CT-FFR across lesion in proximal LAD is 0.92, suggesting lesion is not functionally significant 2. CT-FFR across lesion in ostial D2 is 0.62, suggesting functional significance 3. CT-FFR across lesion in anomalous circumflex is 0.65, suggesting functional significance  Cath 10/06/19:  Prox Cx lesion is 60% stenosed.  Mid Cx lesion is 70% stenosed.  RPDA lesion is 80% stenosed.  Mid LM to Ost LAD lesion is 35% stenosed.  1st Diag lesion is 85%  stenosed.  2nd Diag lesion is 80% stenosed.  Mid LAD lesion is 80% stenosed.  LV end diastolic pressure is normal.   1. Three vessel CAD.    - 80% mid LAD after the second diagonal    - 85% slit like stenosis at the origin of the first diagonal- best seen in the LAO caudal shot.    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 Normal LVEDP  Plan: reviewed with interventional colleagues. Her anatomy doesn't offer good PCI options except the mid LAD. Treating the ostial disease in the diagonal branches would likely require stenting into the LAD. The LCx and PDA are too small for PCI. I would aggressively treat medically. If symptoms persist I would consider surgical consultation.     Recent Labs: 08/27/2020: ALT 16; BNP 57.6; TSH 2.090 10/24/2020: Hemoglobin 14.1; Platelets 227 11/22/2020: BUN 36; Creatinine, Ser 1.42; Magnesium 2.1; Potassium 4.4; Sodium 141  Recent Lipid Panel    Component Value Date/Time   CHOL 152 08/27/2020 1227   TRIG 83 08/27/2020 1227   HDL 76 08/27/2020 1227   CHOLHDL 2.0 08/27/2020 1227   CHOLHDL 5.5 06/14/2019 0009   VLDL 47 (H) 06/14/2019 0009   LDLCALC 60 08/27/2020 1227    Physical Exam:    VS:  BP 127/62   Pulse 74   Ht 5\' 8"  (1.727 m)   Wt 275 lb (124.7 kg)   LMP  (LMP Unknown)   SpO2 98%   BMI 41.81 kg/m     Wt Readings from Last 3 Encounters:  11/22/20 275 lb (124.7 kg)  11/11/20 273 lb 8 oz (124.1 kg)  10/24/20 269 lb 13.5 oz (122.4 kg)    GEN:  in no acute distress HEENT: Normal NECK: No JVD LYMPHATICS: No lymphadenopathy CARDIAC: RRR, no murmurs, rubs, gallops RESPIRATORY:  Clear to auscultation without rales, wheezing or rhonchi  ABDOMEN: Soft, non-tender, non-distended MUSCULOSKELETAL:  No edema; No deformity  SKIN: Warm and dry.   NEUROLOGIC:  Alert and oriented x 3 PSYCHIATRIC:  Normal affect   ASSESSMENT:    1. CAD in native artery   2.  S/P CABG (coronary artery bypass graft)   3. Medication management   4. Lower extremity edema   5. Tobacco use   6. Hyperlipidemia, unspecified hyperlipidemia type    PLAN:    Coronary artery disease: three-vessel CAD on cath 10/06/19: 80% mid LAD, 85% slitlike ostial D1, 80% ostial D2, 80% small PDA, anomalous circumflex off the RCA cusp with moderate diffuse disease.   Status post CABG x4 on 10/24/2019 (LIMA-LAD, radial-diagonal, SVG-diagonal, SVG-PDA).  Normal LV systolic function on echo 06/14/2019.  Stress CMR on 11/05/2020 showed no evidence of ischemia, no LGE, LVEF 61%, RVEF 60%.  Currently denies any anginal symptoms. -Continue aspirin 81 mg daily  -Continue atorvastatin 80 mg daily and Zetia 10 mg daily. LDL 60 on 08/19/2020. -Continue Toprol-XL 12.5 mg daily  Lower extremity edema: No structural heart disease on echocardiogram.  No DVT on duplex.Marland Kitchen  BNP was normal.  Has improved with PO lasix 20 mg daily, will continue.  Check BMET/magnesium.  Hyperlipidemia: LDL 204 on 06/14/19.  Started on atorvastatin 80 mg daily and Zetia 10 mg daily.  LDL 60 on 08/19/2020  Tobacco use: smoked 2ppd x 30 years, has quit smoking but now vaping.  Have discussed risks of vaping and cessation strongly recommended  Type 2 diabetes: A1c 9.9% on 11/11/20, down from 15.1 on 10/20/2019.  On insulin pump, Metformin, Jardiance.  Follows with endocrinology.    RTC in 3 months     Medication Adjustments/Labs and Tests Ordered: Current medicines are reviewed at length with the patient today.  Concerns regarding medicines are outlined above.  Orders Placed This Encounter  Procedures  . Basic metabolic panel  . Magnesium  . Specimen status report   No orders of the defined types were placed in this encounter.   Patient Instructions  Medication Instructions:  Your physician recommends that you continue on your current medications as directed. Please refer to the Current Medication list given to you  today.  *If you need a refill on your cardiac medications before your next appointment, please call your pharmacy*   Lab Work: BMET, Mag today  If you have labs (blood work) drawn today and your tests are completely normal, you will receive your results only by: Marland Kitchen MyChart Message (if you have MyChart) OR . A paper copy in the mail If you have any lab test that is abnormal or we need to change your treatment, we will call you to review the results.   Follow-Up: At [facility], you and your health needs are our priority.  As part of our continuing mission to provide you with exceptional heart care, we have created designated Provider Care Teams.  These Care Teams include your primary Cardiologist (physician) and Advanced Practice Providers (APPs -  Physician Assistants and Nurse Practitioners) who all work together to provide you with the care you need, when you need it.  We recommend signing up for the patient portal called "MyChart".  Sign up information is provided on this After Visit Summary.  MyChart is used to connect with patients for Virtual Visits (Telemedicine).  Patients are able to view lab/test results, encounter notes, upcoming appointments, etc.  Non-urgent messages can be sent to your provider as well.   To learn more about  what you can do with MyChart, go to [email or web address].    Your next appointment:   6 month(s)  The format for your next appointment:   In Person  Provider:   Epifanio Lesches, MD         Signed, Little Ishikawa, MD  11/24/2020 10:13 PM    [facility]

## 2020-11-25 ENCOUNTER — Other Ambulatory Visit: Payer: Self-pay | Admitting: *Deleted

## 2020-11-25 MED ORDER — FUROSEMIDE 20 MG PO TABS: 20.0000 mg | ORAL_TABLET | Freq: Every day | ORAL | 3 refills | Status: DC | PRN

## 2020-12-06 ENCOUNTER — Encounter: Payer: Self-pay | Admitting: Internal Medicine

## 2020-12-06 MED ORDER — DEXCOM G6 RECEIVER DEVI: 0 refills | Status: DC

## 2020-12-06 NOTE — Telephone Encounter (Signed)
Yes, will send the receiver, I just need to know where does she get the sensors from, so I can send it there

## 2020-12-06 NOTE — Telephone Encounter (Signed)
Should we try sending a receiver?

## 2020-12-27 ENCOUNTER — Encounter (HOSPITAL_BASED_OUTPATIENT_CLINIC_OR_DEPARTMENT_OTHER): Payer: Self-pay | Admitting: Emergency Medicine

## 2020-12-27 ENCOUNTER — Emergency Department (HOSPITAL_BASED_OUTPATIENT_CLINIC_OR_DEPARTMENT_OTHER): Payer: 59

## 2020-12-27 ENCOUNTER — Other Ambulatory Visit: Payer: Self-pay

## 2020-12-27 ENCOUNTER — Emergency Department (HOSPITAL_BASED_OUTPATIENT_CLINIC_OR_DEPARTMENT_OTHER)
Admission: EM | Admit: 2020-12-27 | Discharge: 2020-12-27 | Discharge status: Against Medical Advice | Payer: 59 | Attending: Emergency Medicine | Admitting: Emergency Medicine

## 2020-12-27 DIAGNOSIS — Z79899 Other long term (current) drug therapy: Secondary | ICD-10-CM | POA: Insufficient documentation

## 2020-12-27 DIAGNOSIS — Z7984 Long term (current) use of oral hypoglycemic drugs: Secondary | ICD-10-CM | POA: Diagnosis not present

## 2020-12-27 DIAGNOSIS — I129 Hypertensive chronic kidney disease with stage 1 through stage 4 chronic kidney disease, or unspecified chronic kidney disease: Secondary | ICD-10-CM | POA: Diagnosis not present

## 2020-12-27 DIAGNOSIS — Z794 Long term (current) use of insulin: Secondary | ICD-10-CM | POA: Diagnosis not present

## 2020-12-27 DIAGNOSIS — Z87891 Personal history of nicotine dependence: Secondary | ICD-10-CM | POA: Insufficient documentation

## 2020-12-27 DIAGNOSIS — I2510 Atherosclerotic heart disease of native coronary artery without angina pectoris: Secondary | ICD-10-CM | POA: Insufficient documentation

## 2020-12-27 DIAGNOSIS — Z951 Presence of aortocoronary bypass graft: Secondary | ICD-10-CM | POA: Diagnosis not present

## 2020-12-27 DIAGNOSIS — R600 Localized edema: Secondary | ICD-10-CM | POA: Diagnosis not present

## 2020-12-27 DIAGNOSIS — E1122 Type 2 diabetes mellitus with diabetic chronic kidney disease: Secondary | ICD-10-CM | POA: Diagnosis not present

## 2020-12-27 DIAGNOSIS — I959 Hypotension, unspecified: Secondary | ICD-10-CM | POA: Insufficient documentation

## 2020-12-27 DIAGNOSIS — N1831 Chronic kidney disease, stage 3a: Secondary | ICD-10-CM | POA: Diagnosis not present

## 2020-12-27 DIAGNOSIS — E1142 Type 2 diabetes mellitus with diabetic polyneuropathy: Secondary | ICD-10-CM | POA: Insufficient documentation

## 2020-12-27 DIAGNOSIS — E860 Dehydration: Secondary | ICD-10-CM | POA: Diagnosis present

## 2020-12-27 DIAGNOSIS — Z20822 Contact with and (suspected) exposure to covid-19: Secondary | ICD-10-CM | POA: Insufficient documentation

## 2020-12-27 DIAGNOSIS — R42 Dizziness and giddiness: Secondary | ICD-10-CM | POA: Diagnosis present

## 2020-12-27 DIAGNOSIS — Z7982 Long term (current) use of aspirin: Secondary | ICD-10-CM | POA: Insufficient documentation

## 2020-12-27 LAB — COMPREHENSIVE METABOLIC PANEL
ALT: 18 U/L (ref 0–44)
AST: 17 U/L (ref 15–41)
Albumin: 3.8 g/dL (ref 3.5–5.0)
Alkaline Phosphatase: 88 U/L (ref 38–126)
Anion gap: 10 (ref 5–15)
BUN: 46 mg/dL — ABNORMAL HIGH (ref 6–20)
CO2: 25 mmol/L (ref 22–32)
Calcium: 9.3 mg/dL (ref 8.9–10.3)
Chloride: 103 mmol/L (ref 98–111)
Creatinine, Ser: 1.38 mg/dL — ABNORMAL HIGH (ref 0.44–1.00)
GFR, Estimated: 46 mL/min — ABNORMAL LOW (ref >60)
Glucose, Bld: 132 mg/dL — ABNORMAL HIGH (ref 70–99)
Potassium: 4 mmol/L (ref 3.5–5.1)
Sodium: 138 mmol/L (ref 135–145)
Total Bilirubin: 0.4 mg/dL (ref 0.3–1.2)
Total Protein: 6.4 g/dL — ABNORMAL LOW (ref 6.5–8.1)

## 2020-12-27 LAB — CBC WITH DIFFERENTIAL/PLATELET
Abs Immature Granulocytes: 0.01 10*3/uL (ref 0.00–0.07)
Basophils Absolute: 0 10*3/uL (ref 0.0–0.1)
Basophils Relative: 0 %
Eosinophils Absolute: 0.1 10*3/uL (ref 0.0–0.5)
Eosinophils Relative: 1 %
HCT: 40 % (ref 36.0–46.0)
Hemoglobin: 12.6 g/dL (ref 12.0–15.0)
Immature Granulocytes: 0 %
Lymphocytes Relative: 36 %
Lymphs Abs: 2 10*3/uL (ref 0.7–4.0)
MCH: 26.1 pg (ref 26.0–34.0)
MCHC: 31.5 g/dL (ref 30.0–36.0)
MCV: 82.8 fL (ref 80.0–100.0)
Monocytes Absolute: 0.3 10*3/uL (ref 0.1–1.0)
Monocytes Relative: 5 %
Neutro Abs: 3.2 10*3/uL (ref 1.7–7.7)
Neutrophils Relative %: 58 %
Platelets: 172 10*3/uL (ref 150–400)
RBC: 4.83 MIL/uL (ref 3.87–5.11)
RDW: 14 % (ref 11.5–15.5)
WBC: 5.6 10*3/uL (ref 4.0–10.5)
nRBC: 0 % (ref 0.0–0.2)

## 2020-12-27 LAB — URINALYSIS, ROUTINE W REFLEX MICROSCOPIC
Bilirubin Urine: NEGATIVE
Glucose, UA: >1000 mg/dL — AB
Hgb urine dipstick: NEGATIVE
Ketones, ur: NEGATIVE mg/dL
Leukocytes,Ua: NEGATIVE
Nitrite: NEGATIVE
Protein, ur: NEGATIVE mg/dL
Specific Gravity, Urine: 1.009 (ref 1.005–1.030)
pH: 5 (ref 5.0–8.0)

## 2020-12-27 LAB — RESP PANEL BY RT-PCR (FLU A&B, COVID) ARPGX2
Influenza A by PCR: NEGATIVE
Influenza B by PCR: NEGATIVE
SARS Coronavirus 2 by RT PCR: NEGATIVE

## 2020-12-27 LAB — TROPONIN I (HIGH SENSITIVITY): Troponin I (High Sensitivity): <2 ng/L (ref <18)

## 2020-12-27 LAB — LACTIC ACID, PLASMA: Lactic Acid, Venous: 0.9 mmol/L (ref 0.5–1.9)

## 2020-12-27 MED ORDER — SODIUM CHLORIDE 0.9 % IV BOLUS: 1000.0000 mL | Freq: Once | INTRAVENOUS | Status: DC

## 2020-12-27 MED ORDER — SODIUM CHLORIDE 0.9 % IV BOLUS
500.0000 mL | Freq: Once | INTRAVENOUS | Status: AC
  Administered 2020-12-27: 500 mL via INTRAVENOUS

## 2020-12-27 NOTE — ED Notes (Signed)
Pt states she is feeling anxious and sometimes has panic attacks. She reports tightening in her chest and arm. Will report to MD

## 2020-12-27 NOTE — ED Provider Notes (Signed)
Blood pressure (!) 75/52, pulse 63, temperature 97.6 F (36.4 C), temperature source Oral, resp. rate 16, height 5\' 8"  (1.727 m), weight 124 kg, SpO2 97 %.  Assuming care from Dr. .  In short, [name] is a 50 y.o. female with a chief complaint of Dizziness .  Refer to the original H&P for additional details.  The current plan of care is to follow up on labs after repeat IVF bolus.  03:54 PM  Went back to reassess the patient.  She seemed somewhat drowsy but is conversational overall.  She is following all commands and has no focal neurodeficits.  She is afebrile here and not tachycardic.  Her blood work seems within normal range for her including some CKD although not worse and hemoglobin which is normal.  She has had 2 weeks of lightheadedness with standing.  No other associated symptoms.  With continued soft blood pressures I have added on a lactic acid along with troponin, chest x-ray, and will give additional IVF. Does not seem acutely volume overloaded. EKG similar to prior.    EKG Interpretation  Date/Time:  Friday December 27 2020 12:36:39 EDT Ventricular Rate:  64 PR Interval:  162 QRS Duration: 138 QT Interval:  387 QTC Calculation: 400 R Axis:   90 Text Interpretation: Sinus rhythm RBBB and LPFB similar to previous Confirmed by [id number] [phone]) on 12/27/2020 12:51:28 PM      Discussed patient's case with [facility] to request admission. Patient and family (if present) updated with plan. Care transferred to [facility] service.  I reviewed all nursing notes, vitals, pertinent old records, EKGs, labs, imaging (as available).  05:50 PM  I was called to the patient's bedside.  She tells me that she is feeling much better after IV fluids.  Her blood pressures continue to be in the 90s which is low for her.  She tells me that she has a disabled son at home and she is the primary caregiver.  She states someone is with him now but she cannot leave him overnight.  Since she is feeling  better she prefers to go home.  We discussed that her blood pressure still has not returned to her normal level and I am concerned that he could be missing something potentially serious and that delay in treating this could lead to passing out at home, increased chance of morbidity/mortality.  Patient understands but cannot stay for admission as previously discussed and coordinated with the hospitalist service.  She will be leaving AGAINST MEDICAL ADVICE.  She is awake and alert.  She is sitting up in bed and able to engage in a meaningful discussion regarding my concerns and reasons for admission.  She does have capacity to make this decision on my assessment.  She understands that she can return to this or any other emergency department for treatment should she change her mind.  Discussed follow-up with the PCP on Monday.  She will hold her lisinopril moving forward. Patient verbalizes understanding.    Tuesday, MD 12/27/20 1755

## 2020-12-27 NOTE — Discharge Instructions (Addendum)
You were seen in the emergency department today with low blood pressures.  We discussed keeping you in the hospital for at least 24 hours to adjust your home medications and monitor for any other low blood pressures.  You have decided to leave AGAINST MEDICAL ADVICE.  Please understand that you are welcome and encouraged to return or call 911 should your symptoms worsen or you change your mind about your treatment.  Please call your primary care doctor first thing on Monday morning to discuss your ED visit and plan moving forward.

## 2020-12-27 NOTE — ED Notes (Signed)
Pt adamant she cannot stay any longer due to disabled child at home. Pt states she has someone to check on her tonight and call 911 if any symptoms of emergency.

## 2020-12-27 NOTE — ED Provider Notes (Signed)
[facility] EMERGENCY DEPT Provider Note   CSN: [id number] Arrival date & time: 12/27/20  1230     History Chief Complaint  Patient presents with  . Dizziness    RIDDHI GRETHER is a 50 y.o. female.  50 year old female with past medical history below including CAD s/p CABG, HTN, HLD, T2DM, OSA, CKD who presents with dizziness and low blood pressure.  The patient went to routine PCP checkup today and was noted to be hypotensive.  She states that recently she has been having some dizziness which she describes as lightheadedness that is worse with standing.  She denies any associated chest pain or shortness of breath.  She has chronic leg swelling left worse than right ever since CABG.  She does note some weight gain recently.  She denies any nausea, vomiting, diarrhea, bloody stools, melena, urinary symptoms, fevers, or recent illness.  She has been eating and drinking normally.  A few weeks ago, she was started on lisinopril in addition to Lasix and her other medications.  She reports some chest tightening since arriving to the ED which she states is due to her anxiety and usually happens when she feels anxious.  The history is provided by the patient.  Dizziness      Past Medical History:  Diagnosis Date  . Anxiety   . Arthritis    fingers  . Cervical dysplasia   . Coronary artery disease   . Depression   . Diabetes mellitus    Type II  . Dyspnea 11/22/2013  . GERD (gastroesophageal reflux disease)   . High cholesterol   . History of kidney stones    passed  . Hypertension    no meds now  . Kidney stones   . Moderate episode of recurrent major depressive disorder (HCC) 12/28/2019  . Neuropathy    feet and legs  . Nuclear sclerotic cataract of both eyes 09/2019   Dr. Shea Evans  . PONV (postoperative nausea and vomiting)   . Sleep apnea    lost 70lbs no cpap x85yrs now  . Stage 3a chronic kidney disease (HCC) 04/29/2020    Patient Active Problem List    Diagnosis Date Noted  . Type 2 diabetes mellitus with hyperglycemia, with long-term current use of insulin (HCC) 11/11/2020  . Stage 3a chronic kidney disease (HCC) 04/29/2020  . COVID-19 virus vaccination declined 04/29/2020  . Influenza vaccination declined 04/29/2020  . Midline sternotomy scar 04/17/2020  . Left rib fracture 03/13/2020  . Moderate episode of recurrent major depressive disorder (HCC) 12/28/2019  . Encounter for post surgical wound check 12/06/2019  . Type 2 diabetes mellitus with diabetic polyneuropathy, with long-term current use of insulin (HCC) 11/16/2019  . Diabetes mellitus (HCC) 11/16/2019  . Type 2 diabetes mellitus with retinopathy, with long-term current use of insulin (HCC) 11/16/2019  . Dyslipidemia 11/16/2019  . Postop check 11/15/2019  . Diabetic retinopathy (HCC) 10/25/2019  . 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 Angina pectoris (HCC) 10/06/2019  . Left-sided chest pain 06/13/2019  . Post-operative state 06/06/2018  . HGSIL (high grade squamous intraepithelial lesion) on Pap smear of cervix 02/25/2018  . Right upper quadrant abdominal pain 12/30/2017  . Polyarthritis 12/30/2017  . OSA (obstructive sleep apnea) 11/22/2013  . Dyspnea 11/22/2013  . Uncontrolled type 2 diabetes mellitus with hyperosmolar nonketotic hyperglycemia (HCC) 07/21/2012  . HTN (hypertension) 07/21/2012  . Smoker 07/21/2012    Past Surgical History:  Procedure Laterality Date  . CARDIAC CATHETERIZATION  4-26yrs ago  . CERVICAL CONIZATION W/BX N/A 05/04/2018   Procedure: CONIZATION CERVIX WITH BIOPSY - COLD KNIFE;  Surgeon: Hermina Staggers, MD;  Location: [facility];  Service: Gynecology;  Laterality: N/A;  . CHOLECYSTECTOMY    . CORONARY ARTERY BYPASS GRAFT N/A 10/24/2019   Procedure: CORONARY ARTERY BYPASS GRAFTING (CABG) times four, using left radial artery harvested endoscopically and right greater saphenous vein harvested endoscopically.;  Surgeon: Kerin Perna,  MD;  Location: [facility] OR;  Service: Open Heart Surgery;  Laterality: N/A;  . ENDOMETRIAL ABLATION     6 years ago  . INCISION AND DRAINAGE     for boil- buttocks  . INCISION AND DRAINAGE Left    Leg, Nephrotoxin fasciotimy  . IRRIGATION AND DEBRIDEMENT ABSCESS Right 03/17/2017   Procedure: IRRIGATION AND DEBRIDEMENT RIGHT THIGH ABSCESS;  Surgeon: Karie Soda, MD;  Location: [facility] ORS;  Service: General;  Laterality: Right;  . LAPAROSCOPIC APPENDECTOMY  10/04/2011   Procedure: APPENDECTOMY LAPAROSCOPIC;  Surgeon: Rulon Abide, DO;  Location: [facility] ORS;  Service: General;  Laterality: N/A;  . LEFT HEART CATH AND CORONARY ANGIOGRAPHY N/A 10/06/2019   Procedure: LEFT HEART CATH AND CORONARY ANGIOGRAPHY;  Surgeon: Swaziland, Peter M, MD;  Location: [facility] INVASIVE CV LAB;  Service: Cardiovascular;  Laterality: N/A;  . RADIAL ARTERY HARVEST Left 10/24/2019   Procedure: Radial Artery Harvest;  Surgeon: Kerin Perna, MD;  Location: [facility] OR;  Service: Open Heart Surgery;  Laterality: Left;  . TEE WITHOUT CARDIOVERSION N/A 10/24/2019   Procedure: TRANSESOPHAGEAL ECHOCARDIOGRAM (TEE);  Surgeon: Donata Clay, Theron Arista, MD;  Location: [facility] OR;  Service: Open Heart Surgery;  Laterality: N/A;     OB History    Gravida  3   Para  1   Term  0   Preterm  0   AB  2   Living  1     SAB  2   IAB  0   Ectopic  0   Multiple  0   Live Births  1           Family History  Problem Relation Age of Onset  . Aneurysm Mother   . Pancreatic cancer Mother   . Heart attack Father   . Stroke Father   . Heart disease Sister   . Breast cancer Paternal Grandmother   . Congestive Heart Failure Sister   . Osteoarthritis Sister   . Osteoarthritis Sister     Social History   Tobacco Use  . Smoking status: Former Smoker    Packs/day: 2.00    Years: 24.00    Pack years: 48.00    Types: E-cigarettes, Cigarettes    Quit date: 04/22/2017    Years since quitting: 3.6  . Smokeless tobacco: Never Used  . Tobacco  comment: vape  Vaping Use  . Vaping Use: Some days  . Devices: 1 pod last 3-4 days  Substance Use Topics  . Alcohol use: Yes    Comment: rare  . Drug use: No    Home Medications Prior to Admission medications   Medication Sig Start Date End Date Taking? Authorizing Provider  acetaminophen (TYLENOL) 500 MG tablet Take 1,000 mg by mouth every 6 (six) hours as needed for moderate pain or headache.   Yes [provider]  aspirin 81 MG EC tablet Take 1 tablet (81 mg total) by mouth daily. 08/27/20  Yes Saniyyah Elster Ishikawa, MD  atorvastatin (LIPITOR) 80 MG tablet TAKE 1 TABLET(80 MG) BY MOUTH  DAILY AT 6 PM Patient taking differently: Take 80 mg by mouth every evening. 04/29/20  Yes Marcine Matar, MD  Continuous Blood Gluc Receiver (DEXCOM G6 RECEIVER) DEVI Use as instructed to check blood sugar daily 12/06/20  Yes Shamleffer, Konrad Dolores, MD  Continuous Blood Gluc Sensor (DEXCOM G6 SENSOR) MISC 1 Device by Does not apply route as directed. 06/20/20  Yes Shamleffer, Konrad Dolores, MD  Continuous Blood Gluc Transmit (DEXCOM G6 TRANSMITTER) MISC 1 Device by Does not apply route as directed. 06/20/20  Yes Shamleffer, Konrad Dolores, MD  empagliflozin (JARDIANCE) 25 MG TABS tablet Take 1 tablet (25 mg total) by mouth daily before breakfast. 08/12/20  Yes Shamleffer, Konrad Dolores, MD  esomeprazole (NEXIUM) 20 MG capsule Take 20 mg by mouth daily.    Yes [provider]  FLUoxetine (PROZAC) 40 MG capsule TAKE 1 CAPSULE(40 MG) BY MOUTH DAILY Patient taking differently: Take 40 mg by mouth daily. TAKE 1 CAPSULE(40 MG) BY MOUTH DAILY 04/29/20  Yes Marcine Matar, MD  fluticasone [facility]) 50 MCG/ACT nasal spray Place 2 sprays into both nostrils daily as needed for allergies or rhinitis.   Yes [provider]  furosemide (LASIX) 20 MG tablet Take 1 tablet (20 mg total) by mouth daily as needed (weight increase of 3 lbs overnight or 5 lbs in 1 week). 11/25/20  Yes  Cyntha Brickman Ishikawa, MD  glucose blood (TRUE METRIX BLOOD GLUCOSE TEST) test strip Use as instructed 01/13/18  Yes Marcine Matar, MD  insulin aspart (NOVOLOG) 100 UNIT/ML injection Max daily 60 units per pump 09/19/20  Yes Shamleffer, Konrad Dolores, MD  Insulin Disposable Pump (OMNIPOD 10 PACK) MISC 1 Device by Does not apply route as directed. 06/20/20  Yes Shamleffer, Konrad Dolores, MD  Insulin Syringes, Disposable, U-100 1 ML MISC 1 Device by Does not apply route as directed. 09/19/20  Yes Shamleffer, Konrad Dolores, MD  lisinopril (ZESTRIL) 2.5 MG tablet Take 2.5 mg by mouth daily. 10/29/20  Yes [provider]  Melatonin 10 MG CAPS Take 10 mg by mouth at bedtime as needed (sleep).   Yes [provider]  metFORMIN (GLUCOPHAGE-XR) 500 MG 24 hr tablet Take 2 tablets (1,000 mg total) by mouth daily with breakfast. 08/21/20  Yes Shamleffer, Konrad Dolores, MD  metoprolol succinate (TOPROL-XL) 25 MG 24 hr tablet TAKE 1/2 TABLET BY MOUTH EVERY DAY Patient taking differently: Take 12.5 mg by mouth daily. 10/15/20  Yes Taft Worthing Ishikawa, MD  omeprazole (PRILOSEC) 20 MG capsule Take 20 mg by mouth daily.   Yes [provider]  pregabalin (LYRICA) 300 MG capsule Take 1 capsule (300 mg total) by mouth 2 (two) times daily. 11/11/20  Yes Shamleffer, Konrad Dolores, MD  TRUEPLUS SAFETY LANCETS 28G MISC Use as directed 01/13/18  Yes Marcine Matar, MD  albuterol (VENTOLIN HFA) 108 (90 Base) MCG/ACT inhaler Inhale 1-2 puffs into the lungs every 6 (six) hours as needed for wheezing or shortness of breath. 02/20/20   Cathie Hoops, Amy V, PA-C  ezetimibe (ZETIA) 10 MG tablet Take 1 tablet (10 mg total) by mouth daily. Patient taking differently: Take 10 mg by mouth every evening. 10/04/19 04/22/20  Shandy Vi Ishikawa, MD  nitroGLYCERIN (NITROSTAT) 0.4 MG SL tablet Place 0.4 mg under the tongue every 5 (five) minutes as needed for chest pain.    [provider]     Allergies    Sulfa antibiotics and Codeine  Review of Systems   Review of Systems  Neurological: Positive  for dizziness.   All other systems reviewed and are negative except that which was mentioned in HPI  Physical Exam Updated Vital Signs BP (!) 98/51   Pulse 66   Temp 97.6 F (36.4 C) (Oral)   Resp 18   Ht 5\' 8"  (1.727 m)   Wt 124 kg   SpO2 98%   BMI 41.57 kg/m   Physical Exam Constitutional:      General: She is not in acute distress.    Appearance: Normal appearance. She is obese.  HENT:     Head: Normocephalic and atraumatic.  Eyes:     Conjunctiva/sclera: Conjunctivae normal.  Cardiovascular:     Rate and Rhythm: Normal rate and regular rhythm.     Heart sounds: Normal heart sounds. No murmur heard.   Pulmonary:     Effort: Pulmonary effort is normal.     Breath sounds: Normal breath sounds.  Abdominal:     General: Abdomen is flat. Bowel sounds are normal. There is no distension.     Palpations: Abdomen is soft.     Tenderness: There is no abdominal tenderness.  Musculoskeletal:     Comments: Mild LLE edema compared to R  Skin:    General: Skin is warm and dry.  Neurological:     Mental Status: She is alert and oriented to person, place, and time.     Comments: fluent  Psychiatric:        Mood and Affect: Mood normal.        Behavior: Behavior normal.     ED Results / Procedures / Treatments   Labs (all labs ordered are listed, but only abnormal results are displayed) Labs Reviewed  COMPREHENSIVE METABOLIC PANEL - Abnormal; Notable for the following components:      Result Value   Glucose, Bld 132 (*)    BUN 46 (*)    Creatinine, Ser 1.38 (*)    Total Protein 6.4 (*)    GFR, Estimated 46 (*)    All other components within normal limits  CBC WITH DIFFERENTIAL/PLATELET  URINALYSIS, ROUTINE W REFLEX MICROSCOPIC    EKG EKG Interpretation  Date/Time:  Friday December 27 2020 12:36:39 EDT Ventricular Rate:  64 PR Interval:  162 QRS  Duration: 138 QT Interval:  387 QTC Calculation: 400 R Axis:   90 Text Interpretation: Sinus rhythm RBBB and LPFB similar to previous Confirmed by Frederick Peers [phone]) on 12/27/2020 12:51:28 PM   Radiology No results found.  Procedures Procedures   Medications Ordered in ED Medications  sodium chloride 0.9 % bolus 500 mL (has no administration in time range)  sodium chloride 0.9 % bolus 500 mL (500 mLs Intravenous New Bag/Given 12/27/20 1335)    ED Course  I have reviewed the triage vital signs and the nursing notes.  Pertinent labs & imaging results that were available during my care of the patient were reviewed by me and considered in my medical decision making (see chart for details).    MDM Rules/Calculators/A&P                          Comfortable on exam, BP 92/58, HR 65, EKG similar to previous. Ordered fluid bolus. Lab work shows normal CBC, CMP with BUN of 46 slightly above normal but creatinine 1.38 similar to previous.  Patient is resting comfortably on reassessment.  Her blood pressure has remained on the low end of normal with systolics in the 90s and MAP ~  65. I have ordered another IVF.  She again denies any infectious symptoms and I do not feel her lower blood pressures represent sepsis.  Hemoglobin is normal, no evidence of blood loss.  She denies frequent prednisone use and I doubt adrenal insufficiency.  We will obtain a manual blood pressure after her second fluid bolus.  Patient signed out to oncoming provider Final Clinical Impression(s) / ED Diagnoses Final diagnoses:  None    Rx / DC Orders ED Discharge Orders    None       Khady Vandenberg, Ambrose Finland, MD 12/27/20 1447

## 2020-12-27 NOTE — ED Notes (Signed)
Pt left ama. Pt escorted to our waiting room. Pt had belongings  (clothes, cell phone, purse and glasses in her possession. ). RN reviewed with patient leaving AMA. Pt states understanding, signed AMA .

## 2020-12-27 NOTE — ED Triage Notes (Signed)
Pt was started on lisinopril few weeks ago to protect her kidneys . Pt states she has stg 3 ckd. Pt had a checkup with MD this am and was found to be hypotensive 70/50.

## 2021-01-15 ENCOUNTER — Ambulatory Visit: Payer: 59 | Admitting: Cardiology

## 2021-01-21 ENCOUNTER — Other Ambulatory Visit: Payer: Self-pay | Admitting: Internal Medicine

## 2021-01-21 DIAGNOSIS — E1140 Type 2 diabetes mellitus with diabetic neuropathy, unspecified: Secondary | ICD-10-CM

## 2021-01-21 DIAGNOSIS — IMO0002 Reserved for concepts with insufficient information to code with codable children: Secondary | ICD-10-CM

## 2021-02-03 ENCOUNTER — Other Ambulatory Visit: Payer: Self-pay | Admitting: Internal Medicine

## 2021-02-05 ENCOUNTER — Telehealth: Payer: 59 | Admitting: Physician Assistant

## 2021-02-05 DIAGNOSIS — J019 Acute sinusitis, unspecified: Secondary | ICD-10-CM

## 2021-02-05 DIAGNOSIS — B9689 Other specified bacterial agents as the cause of diseases classified elsewhere: Secondary | ICD-10-CM

## 2021-02-05 DIAGNOSIS — H9202 Otalgia, left ear: Secondary | ICD-10-CM | POA: Diagnosis not present

## 2021-02-05 MED ORDER — AMOXICILLIN-POT CLAVULANATE 875-125 MG PO TABS: 1.0000 | ORAL_TABLET | Freq: Two times a day (BID) | ORAL | 0 refills | Status: DC

## 2021-02-05 MED ORDER — FLUTICASONE PROPIONATE 50 MCG/ACT NA SUSP: 2.0000 | Freq: Every day | NASAL | 0 refills | Status: AC | PRN

## 2021-02-05 NOTE — Patient Instructions (Signed)
[name], thank you for joining Piedad Climes, PA-C for today's virtual visit.  While this provider is not your primary care provider (PCP), if your PCP is located in our provider database this encounter information will be shared with them immediately following your visit.  Consent: (Patient) [name] provided verbal consent for this virtual visit at the beginning of the encounter.  Current Medications:  Current Outpatient Medications:  .  acetaminophen (TYLENOL) 500 MG tablet, Take 1,000 mg by mouth every 6 (six) hours as needed for moderate pain or headache., Disp: , Rfl:  .  albuterol (VENTOLIN HFA) 108 (90 Base) MCG/ACT inhaler, Inhale 1-2 puffs into the lungs every 6 (six) hours as needed for wheezing or shortness of breath., Disp: 18 g, Rfl: 0 .  amoxicillin-clavulanate (AUGMENTIN) 875-125 MG tablet, Take 1 tablet by mouth 2 (two) times daily., Disp: 14 tablet, Rfl: 0 .  aspirin 81 MG EC tablet, Take 1 tablet (81 mg total) by mouth daily., Disp: , Rfl:  .  atorvastatin (LIPITOR) 80 MG tablet, TAKE 1 TABLET(80 MG) BY MOUTH DAILY AT 6 PM (Patient taking differently: Take 80 mg by mouth every evening.), Disp: 90 tablet, Rfl: 2 .  Continuous Blood Gluc Receiver (DEXCOM G6 RECEIVER) DEVI, Use as instructed to check blood sugar daily, Disp: 1 each, Rfl: 0 .  Continuous Blood Gluc Sensor (DEXCOM G6 SENSOR) MISC, 1 Device by Does not apply route as directed., Disp: 3 each, Rfl: 11 .  Continuous Blood Gluc Transmit (DEXCOM G6 TRANSMITTER) MISC, 1 Device by Does not apply route as directed., Disp: 1 each, Rfl: 3 .  empagliflozin (JARDIANCE) 25 MG TABS tablet, Take 1 tablet (25 mg total) by mouth daily before breakfast., Disp: 30 tablet, Rfl: 6 .  esomeprazole (NEXIUM) 20 MG capsule, Take 20 mg by mouth daily. , Disp: , Rfl:  .  ezetimibe (ZETIA) 10 MG tablet, Take 1 tablet (10 mg total) by mouth daily. (Patient taking differently: Take 10 mg by mouth every evening.), Disp: 90  tablet, Rfl: 3 .  FLUoxetine (PROZAC) 40 MG capsule, TAKE 1 CAPSULE(40 MG) BY MOUTH DAILY (Patient taking differently: Take 40 mg by mouth daily. TAKE 1 CAPSULE(40 MG) BY MOUTH DAILY), Disp: 90 capsule, Rfl: 2 .  fluticasone (FLONASE) 50 MCG/ACT nasal spray, Place 2 sprays into both nostrils daily as needed for allergies or rhinitis., Disp: 16 g, Rfl: 0 .  furosemide (LASIX) 20 MG tablet, Take 1 tablet (20 mg total) by mouth daily as needed (weight increase of 3 lbs overnight or 5 lbs in 1 week)., Disp: 90 tablet, Rfl: 3 .  glucose blood (TRUE METRIX BLOOD GLUCOSE TEST) test strip, Use as instructed, Disp: 100 each, Rfl: 12 .  insulin aspart (NOVOLOG) 100 UNIT/ML injection, Max daily 60 units per pump, Disp: 20 mL, Rfl: 11 .  Insulin Disposable Pump (OMNIPOD 10 PACK) MISC, 1 Device by Does not apply route as directed., Disp: 3 each, Rfl: 6 .  Insulin Syringes, Disposable, U-100 1 ML MISC, 1 Device by Does not apply route as directed., Disp: 100 each, Rfl: 6 .  lisinopril (ZESTRIL) 2.5 MG tablet, Take 2.5 mg by mouth daily., Disp: , Rfl:  .  Melatonin 10 MG CAPS, Take 10 mg by mouth at bedtime as needed (sleep)., Disp: , Rfl:  .  metFORMIN (GLUCOPHAGE-XR) 500 MG 24 hr tablet, TAKE 2 TABLETS(1000 MG) BY MOUTH DAILY WITH BREAKFAST, Disp: 180 tablet, Rfl: 1 .  metoprolol succinate (TOPROL-XL) 25 MG 24  hr tablet, TAKE 1/2 TABLET BY MOUTH EVERY DAY (Patient taking differently: Take 12.5 mg by mouth daily.), Disp: 45 tablet, Rfl: 0 .  nitroGLYCERIN (NITROSTAT) 0.4 MG SL tablet, Place 0.4 mg under the tongue every 5 (five) minutes as needed for chest pain., Disp: , Rfl:  .  omeprazole (PRILOSEC) 20 MG capsule, Take 20 mg by mouth daily., Disp: , Rfl:  .  pregabalin (LYRICA) 300 MG capsule, Take 1 capsule (300 mg total) by mouth 2 (two) times daily., Disp: 180 capsule, Rfl: 4 .  TRUEPLUS SAFETY LANCETS 28G MISC, Use as directed, Disp: 100 each, Rfl: 3   Medications ordered in this encounter:  Meds ordered  this encounter  Medications  . fluticasone (FLONASE) 50 MCG/ACT nasal spray    Sig: Place 2 sprays into both nostrils daily as needed for allergies or rhinitis.    Dispense:  16 g    Refill:  0    Order Specific Question:   Supervising Provider    Answer:   MILLER, BRIAN [[id number]]  . amoxicillin-clavulanate (AUGMENTIN) 875-125 MG tablet    Sig: Take 1 tablet by mouth 2 (two) times daily.    Dispense:  14 tablet    Refill:  0    Order Specific Question:   Supervising Provider    Answer:   Hyacinth Meeker, BRIAN [[id number]]     *If you need refills on other medications prior to your next appointment, please contact your pharmacy*  Follow-Up: Call back or seek an in-person evaluation if the symptoms worsen or if the condition fails to improve as anticipated.   Other Instructions  Please take antibiotic as directed.  Increase fluid intake.  Use Saline nasal spray.  Take a daily multivitamin. Restart the Flonase.  Place a humidifier in the bedroom.    Sinusitis Sinusitis is redness, soreness, and swelling (inflammation) of the paranasal sinuses. Paranasal sinuses are air pockets within the bones of your face (beneath the eyes, the middle of the forehead, or above the eyes). In healthy paranasal sinuses, mucus is able to drain out, and air is able to circulate through them by way of your nose. However, when your paranasal sinuses are inflamed, mucus and air can become trapped. This can allow bacteria and other germs to grow and cause infection. Sinusitis can develop quickly and last only a short time (acute) or continue over a long period (chronic). Sinusitis that lasts for more than 12 weeks is considered chronic.  CAUSES  Causes of sinusitis include:  Allergies.  Structural abnormalities, such as displacement of the cartilage that separates your nostrils (deviated septum), which can decrease the air flow through your nose and sinuses and affect sinus drainage.  Functional abnormalities, such as when  the small hairs (cilia) that line your sinuses and help remove mucus do not work properly or are not present. SYMPTOMS  Symptoms of acute and chronic sinusitis are the same. The primary symptoms are pain and pressure around the affected sinuses. Other symptoms include:  Upper toothache.  Earache.  Headache.  Bad breath.  Decreased sense of smell and taste.  A cough, which worsens when you are lying flat.  Fatigue.  Fever.  Thick drainage from your nose, which often is green and may contain pus (purulent).  Swelling and warmth over the affected sinuses. DIAGNOSIS  Your caregiver will perform a physical exam. During the exam, your caregiver may:  Look in your nose for signs of abnormal growths in your nostrils (nasal polyps).  Tap over the  affected sinus to check for signs of infection.  View the inside of your sinuses (endoscopy) with a special imaging device with a light attached (endoscope), which is inserted into your sinuses. If your caregiver suspects that you have chronic sinusitis, one or more of the following tests may be recommended:  Allergy tests.  Nasal culture A sample of mucus is taken from your nose and sent to a lab and screened for bacteria.  Nasal cytology A sample of mucus is taken from your nose and examined by your caregiver to determine if your sinusitis is related to an allergy. TREATMENT  Most cases of acute sinusitis are related to a viral infection and will resolve on their own within 10 days. Sometimes medicines are prescribed to help relieve symptoms (pain medicine, decongestants, nasal steroid sprays, or saline sprays).  However, for sinusitis related to a bacterial infection, your caregiver will prescribe antibiotic medicines. These are medicines that will help kill the bacteria causing the infection.  Rarely, sinusitis is caused by a fungal infection. In theses cases, your caregiver will prescribe antifungal medicine. For some cases of chronic  sinusitis, surgery is needed. Generally, these are cases in which sinusitis recurs more than 3 times per year, despite other treatments. HOME CARE INSTRUCTIONS   Drink plenty of water. Water helps thin the mucus so your sinuses can drain more easily.  Use a humidifier.  Inhale steam 3 to 4 times a day (for example, sit in the bathroom with the shower running).  Apply a warm, moist washcloth to your face 3 to 4 times a day, or as directed by your caregiver.  Use saline nasal sprays to help moisten and clean your sinuses.  Take over-the-counter or prescription medicines for pain, discomfort, or fever only as directed by your caregiver. SEEK IMMEDIATE MEDICAL CARE IF:  You have increasing pain or severe headaches.  You have nausea, vomiting, or drowsiness.  You have swelling around your face.  You have vision problems.  You have a stiff neck.  You have difficulty breathing. MAKE SURE YOU:   Understand these instructions.  Will watch your condition.  Will get help right away if you are not doing well or get worse. Document Released: 08/17/2005 Document Revised: 11/09/2011 Document Reviewed: 09/01/2011 [facility] Patient Information 2014 [place], [place].     If you have been instructed to have an in-person evaluation today at a local Urgent Care facility, please use the link below. It will take you to a list of all of our available [facility], including address, phone number and hours of operation. Please do not delay care.  [facility]  If you or a family member do not have a primary care provider, use the link below to schedule a visit and establish care. When you choose a [facility] primary care physician or advanced practice provider, you gain a long-term partner in health. Find a Primary Care Provider  Learn more about [facility]'s in-office and virtual care options: [facility] - Get Care Now

## 2021-02-05 NOTE — Progress Notes (Signed)
[name] are scheduled for a virtual visit with your provider today.    Just as we do with appointments in the office, we must obtain your consent to participate.  Your consent will be active for this visit and any virtual visit you may have with one of our providers in the next 365 days.    If you have a MyChart account, I can also send a copy of this consent to you electronically.  All virtual visits are billed to your insurance company just like a traditional visit in the office.  As this is a virtual visit, video technology does not allow for your provider to perform a traditional examination.  This may limit your provider's ability to fully assess your condition.  If your provider identifies any concerns that need to be evaluated in person or the need to arrange testing such as labs, EKG, etc, we will make arrangements to do so.    Although advances in technology are sophisticated, we cannot ensure that it will always work on either your end or our end.  If the connection with a video visit is poor, we may have to switch to a telephone visit.  With either a video or telephone visit, we are not always able to ensure that we have a secure connection.   I need to obtain your verbal consent now.   Are you willing to proceed with your visit today?   [name] has provided verbal consent on 02/05/2021 for a virtual visit (video or telephone).  Piedad Climes, PA-C 02/05/2021  10:30 AM  Virtual Visit via Video   I connected with patient on 02/05/21 at 10:30 AM EDT by a video enabled telemedicine application and verified that I am speaking with the correct person using two identifiers.  Location patient: Home Location provider: [facility] Persons participating in the virtual visit: Patient, Provider  I discussed the limitations of evaluation and management by telemedicine and the availability of in person appointments. The patient expressed understanding and agreed  to proceed.  Subjective:   HPI:  Patient presents via [facility] today c/o worsening sinus pressure with L sided frontal and maxillary sinus pain, tooth pain and ear pain. Denies fever, chills or aches. Denies chest congestion or SOB. Has been taking Tylenol as needed. Is out of her Flonase. Notes she has a big history of sinusitis and otitis media going back over 30 years. Feels this is identical to previous episodes.   ROS:   See pertinent positives and negatives per HPI.  Patient Active Problem List   Diagnosis Date Noted  . Dehydration 12/27/2020  . Type 2 diabetes mellitus with hyperglycemia, with long-term current use of insulin (HCC) 11/11/2020  . Stage 3a chronic kidney disease (HCC) 04/29/2020  . COVID-19 virus vaccination declined 04/29/2020  . Influenza vaccination declined 04/29/2020  . Midline sternotomy scar 04/17/2020  . Left rib fracture 03/13/2020  . Moderate episode of recurrent major depressive disorder (HCC) 12/28/2019  . Encounter for post surgical wound check 12/06/2019  . Type 2 diabetes mellitus with diabetic polyneuropathy, with long-term current use of insulin (HCC) 11/16/2019  . Diabetes mellitus (HCC) 11/16/2019  . Type 2 diabetes mellitus with retinopathy, with long-term current use of insulin (HCC) 11/16/2019  . Dyslipidemia 11/16/2019  . Postop check 11/15/2019  . Diabetic retinopathy (HCC) 10/25/2019  . 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 Angina pectoris (HCC) 10/06/2019  . Left-sided chest pain 06/13/2019  . Post-operative state 06/06/2018  .  HGSIL (high grade squamous intraepithelial lesion) on Pap smear of cervix 02/25/2018  . Right upper quadrant abdominal pain 12/30/2017  . Polyarthritis 12/30/2017  . OSA (obstructive sleep apnea) 11/22/2013  . Dyspnea 11/22/2013  . Uncontrolled type 2 diabetes mellitus with hyperosmolar nonketotic hyperglycemia (HCC) 07/21/2012  . HTN (hypertension) 07/21/2012  . Smoker 07/21/2012    Social History   Tobacco Use   . Smoking status: Former Smoker    Packs/day: 2.00    Years: 24.00    Pack years: 48.00    Types: E-cigarettes, Cigarettes    Quit date: 04/22/2017    Years since quitting: 3.7  . Smokeless tobacco: Never Used  . Tobacco comment: vape  Substance Use Topics  . Alcohol use: Yes    Comment: rare    Current Outpatient Medications:  .  acetaminophen (TYLENOL) 500 MG tablet, Take 1,000 mg by mouth every 6 (six) hours as needed for moderate pain or headache., Disp: , Rfl:  .  albuterol (VENTOLIN HFA) 108 (90 Base) MCG/ACT inhaler, Inhale 1-2 puffs into the lungs every 6 (six) hours as needed for wheezing or shortness of breath., Disp: 18 g, Rfl: 0 .  aspirin 81 MG EC tablet, Take 1 tablet (81 mg total) by mouth daily., Disp: , Rfl:  .  atorvastatin (LIPITOR) 80 MG tablet, TAKE 1 TABLET(80 MG) BY MOUTH DAILY AT 6 PM (Patient taking differently: Take 80 mg by mouth every evening.), Disp: 90 tablet, Rfl: 2 .  Continuous Blood Gluc Receiver (DEXCOM G6 RECEIVER) DEVI, Use as instructed to check blood sugar daily, Disp: 1 each, Rfl: 0 .  Continuous Blood Gluc Sensor (DEXCOM G6 SENSOR) MISC, 1 Device by Does not apply route as directed., Disp: 3 each, Rfl: 11 .  Continuous Blood Gluc Transmit (DEXCOM G6 TRANSMITTER) MISC, 1 Device by Does not apply route as directed., Disp: 1 each, Rfl: 3 .  empagliflozin (JARDIANCE) 25 MG TABS tablet, Take 1 tablet (25 mg total) by mouth daily before breakfast., Disp: 30 tablet, Rfl: 6 .  esomeprazole (NEXIUM) 20 MG capsule, Take 20 mg by mouth daily. , Disp: , Rfl:  .  ezetimibe (ZETIA) 10 MG tablet, Take 1 tablet (10 mg total) by mouth daily. (Patient taking differently: Take 10 mg by mouth every evening.), Disp: 90 tablet, Rfl: 3 .  FLUoxetine (PROZAC) 40 MG capsule, TAKE 1 CAPSULE(40 MG) BY MOUTH DAILY (Patient taking differently: Take 40 mg by mouth daily. TAKE 1 CAPSULE(40 MG) BY MOUTH DAILY), Disp: 90 capsule, Rfl: 2 .  fluticasone (FLONASE) 50 MCG/ACT nasal  spray, Place 2 sprays into both nostrils daily as needed for allergies or rhinitis., Disp: , Rfl:  .  furosemide (LASIX) 20 MG tablet, Take 1 tablet (20 mg total) by mouth daily as needed (weight increase of 3 lbs overnight or 5 lbs in 1 week)., Disp: 90 tablet, Rfl: 3 .  glucose blood (TRUE METRIX BLOOD GLUCOSE TEST) test strip, Use as instructed, Disp: 100 each, Rfl: 12 .  insulin aspart (NOVOLOG) 100 UNIT/ML injection, Max daily 60 units per pump, Disp: 20 mL, Rfl: 11 .  Insulin Disposable Pump (OMNIPOD 10 PACK) MISC, 1 Device by Does not apply route as directed., Disp: 3 each, Rfl: 6 .  Insulin Syringes, Disposable, U-100 1 ML MISC, 1 Device by Does not apply route as directed., Disp: 100 each, Rfl: 6 .  lisinopril (ZESTRIL) 2.5 MG tablet, Take 2.5 mg by mouth daily., Disp: , Rfl:  .  Melatonin 10 MG CAPS, Take  10 mg by mouth at bedtime as needed (sleep)., Disp: , Rfl:  .  metFORMIN (GLUCOPHAGE-XR) 500 MG 24 hr tablet, TAKE 2 TABLETS(1000 MG) BY MOUTH DAILY WITH BREAKFAST, Disp: 180 tablet, Rfl: 1 .  metoprolol succinate (TOPROL-XL) 25 MG 24 hr tablet, TAKE 1/2 TABLET BY MOUTH EVERY DAY (Patient taking differently: Take 12.5 mg by mouth daily.), Disp: 45 tablet, Rfl: 0 .  nitroGLYCERIN (NITROSTAT) 0.4 MG SL tablet, Place 0.4 mg under the tongue every 5 (five) minutes as needed for chest pain., Disp: , Rfl:  .  omeprazole (PRILOSEC) 20 MG capsule, Take 20 mg by mouth daily., Disp: , Rfl:  .  pregabalin (LYRICA) 300 MG capsule, Take 1 capsule (300 mg total) by mouth 2 (two) times daily., Disp: 180 capsule, Rfl: 4 .  TRUEPLUS SAFETY LANCETS 28G MISC, Use as directed, Disp: 100 each, Rfl: 3  Allergies  Allergen Reactions  . Sulfa Antibiotics Hives  . Codeine Itching    Objective:   There were no vitals taken for this visit.  Patient is well-developed, well-nourished in no acute distress.  Resting comfortably at home.  Head is normocephalic, atraumatic.  No labored breathing.  Speech is  clear and coherent with logical content.  Patient is alert and oriented at baseline.   Assessment and Plan:   1. Acute bacterial sinusitis 2. Acute otalgia, left Rx Augmentin.  Increase fluids.  Rest.  Saline nasal spray.  Probiotic.  Mucinex as directed.  Humidifier in bedroom. Flonase refilled.  Follow-up in person or message [place] if symptoms are not resolving.   - fluticasone (FLONASE) 50 MCG/ACT nasal spray; Place 2 sprays into both nostrils daily as needed for allergies or rhinitis.  Dispense: 16 g; Refill: 0 - amoxicillin-clavulanate (AUGMENTIN) 875-125 MG tablet; Take 1 tablet by mouth 2 (two) times daily.  Dispense: 14 tablet; Refill: 0     Piedad Climes, [place] 02/05/2021

## 2021-02-21 ENCOUNTER — Other Ambulatory Visit: Payer: Self-pay | Admitting: Cardiology

## 2021-02-21 NOTE — Telephone Encounter (Signed)
This is Dr. Schumann's pt 

## 2021-03-04 ENCOUNTER — Encounter (HOSPITAL_BASED_OUTPATIENT_CLINIC_OR_DEPARTMENT_OTHER): Payer: Self-pay | Admitting: Emergency Medicine

## 2021-03-04 ENCOUNTER — Other Ambulatory Visit: Payer: Self-pay

## 2021-03-04 ENCOUNTER — Emergency Department (HOSPITAL_BASED_OUTPATIENT_CLINIC_OR_DEPARTMENT_OTHER)
Admission: EM | Admit: 2021-03-04 | Discharge: 2021-03-04 | Disposition: A | Discharge status: Home / Self Care | Payer: 59 | Attending: Emergency Medicine | Admitting: Emergency Medicine

## 2021-03-04 DIAGNOSIS — Z87891 Personal history of nicotine dependence: Secondary | ICD-10-CM | POA: Diagnosis not present

## 2021-03-04 DIAGNOSIS — W541XXA Struck by dog, initial encounter: Secondary | ICD-10-CM | POA: Diagnosis not present

## 2021-03-04 DIAGNOSIS — E1122 Type 2 diabetes mellitus with diabetic chronic kidney disease: Secondary | ICD-10-CM | POA: Diagnosis not present

## 2021-03-04 DIAGNOSIS — I129 Hypertensive chronic kidney disease with stage 1 through stage 4 chronic kidney disease, or unspecified chronic kidney disease: Secondary | ICD-10-CM | POA: Diagnosis not present

## 2021-03-04 DIAGNOSIS — Z7984 Long term (current) use of oral hypoglycemic drugs: Secondary | ICD-10-CM | POA: Diagnosis not present

## 2021-03-04 DIAGNOSIS — S0502XA Injury of conjunctiva and corneal abrasion without foreign body, left eye, initial encounter: Secondary | ICD-10-CM | POA: Diagnosis not present

## 2021-03-04 DIAGNOSIS — S0592XA Unspecified injury of left eye and orbit, initial encounter: Secondary | ICD-10-CM | POA: Diagnosis present

## 2021-03-04 DIAGNOSIS — E11319 Type 2 diabetes mellitus with unspecified diabetic retinopathy without macular edema: Secondary | ICD-10-CM | POA: Diagnosis not present

## 2021-03-04 DIAGNOSIS — Z794 Long term (current) use of insulin: Secondary | ICD-10-CM | POA: Insufficient documentation

## 2021-03-04 DIAGNOSIS — I2510 Atherosclerotic heart disease of native coronary artery without angina pectoris: Secondary | ICD-10-CM | POA: Insufficient documentation

## 2021-03-04 DIAGNOSIS — Z951 Presence of aortocoronary bypass graft: Secondary | ICD-10-CM | POA: Insufficient documentation

## 2021-03-04 DIAGNOSIS — N1831 Chronic kidney disease, stage 3a: Secondary | ICD-10-CM | POA: Insufficient documentation

## 2021-03-04 DIAGNOSIS — Z79899 Other long term (current) drug therapy: Secondary | ICD-10-CM | POA: Insufficient documentation

## 2021-03-04 DIAGNOSIS — Z7982 Long term (current) use of aspirin: Secondary | ICD-10-CM | POA: Insufficient documentation

## 2021-03-04 MED ORDER — FLUORESCEIN SODIUM 1 MG OP STRP
1.0000 | ORAL_STRIP | Freq: Once | OPHTHALMIC | Status: AC
  Administered 2021-03-04: 1 via OPHTHALMIC

## 2021-03-04 MED ORDER — ERYTHROMYCIN 5 MG/GM OP OINT: TOPICAL_OINTMENT | Freq: Once | OPHTHALMIC | Status: AC

## 2021-03-04 MED ORDER — TETRACAINE HCL 0.5 % OP SOLN
1.0000 [drp] | Freq: Once | OPHTHALMIC | Status: AC
  Administered 2021-03-04: 1 [drp] via OPHTHALMIC

## 2021-03-04 NOTE — Discharge Instructions (Addendum)
Erythromycin ointment 4 times per day for 5-7 days

## 2021-03-04 NOTE — ED Triage Notes (Signed)
  Patient comes in with L eye injury that occurred around 1900 last night.  Patient states she was laying down with her dog and he got startled kicking her in the eye.  Patient felt the dog scratch her eye and washed it out.  Patient states she is unable to keep her eye open and tried visine with cold compresses with little relief.  Patient states there has been some drainage but no blood.  Patient can hold eye open and tolerate well but has blurry vision.  Pain 2/10.

## 2021-03-04 NOTE — ED Provider Notes (Signed)
[facility] EMERGENCY DEPT Provider Note  CSN: [id number] Arrival date & time: 03/04/21 0357  Chief Complaint(s) Eye Injury  HPI [name] is a 50 y.o. female    Eye Injury This is a new problem. The current episode started 6 to 12 hours ago. The problem occurs constantly. The problem has not changed since onset.Pertinent negatives include no chest pain, no abdominal pain, no headaches and no shortness of breath. Nothing aggravates the symptoms. Nothing relieves the symptoms.   Was accidentally kicked in the eye by her dog.  Past Medical History Past Medical History:  Diagnosis Date   Anxiety    Arthritis    fingers   Cervical dysplasia    Coronary artery disease    Depression    Diabetes mellitus    Type II   Dyspnea 11/22/2013   GERD (gastroesophageal reflux disease)    High cholesterol    History of kidney stones    passed   Hypertension    no meds now   Kidney stones    Moderate episode of recurrent major depressive disorder (HCC) 12/28/2019   Neuropathy    feet and legs   Nuclear sclerotic cataract of both eyes 09/2019   Dr. Shea Evans   PONV (postoperative nausea and vomiting)    Sleep apnea    lost 70lbs no cpap x77yrs now   Stage 3a chronic kidney disease (HCC) 04/29/2020   Patient Active Problem List   Diagnosis Date Noted   Dehydration 12/27/2020   Type 2 diabetes mellitus with hyperglycemia, with long-term current use of insulin (HCC) 11/11/2020   Stage 3a chronic kidney disease (HCC) 04/29/2020   COVID-19 virus vaccination declined 04/29/2020   Influenza vaccination declined 04/29/2020   Midline sternotomy scar 04/17/2020   Left rib fracture 03/13/2020   Moderate episode of recurrent major depressive disorder (HCC) 12/28/2019   Encounter for post surgical wound check 12/06/2019   Type 2 diabetes mellitus with diabetic polyneuropathy, with long-term current use of insulin (HCC) 11/16/2019   Diabetes mellitus (HCC) 11/16/2019   Type 2  diabetes mellitus with retinopathy, with long-term current use of insulin (HCC) 11/16/2019   Dyslipidemia 11/16/2019   Postop check 11/15/2019   Diabetic retinopathy (HCC) 10/25/2019   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   Angina pectoris (HCC) 10/06/2019   Left-sided chest pain 06/13/2019   Post-operative state 06/06/2018   HGSIL (high grade squamous intraepithelial lesion) on Pap smear of cervix 02/25/2018   Right upper quadrant abdominal pain 12/30/2017   Polyarthritis 12/30/2017   OSA (obstructive sleep apnea) 11/22/2013   Dyspnea 11/22/2013   Uncontrolled type 2 diabetes mellitus with hyperosmolar nonketotic hyperglycemia (HCC) 07/21/2012   HTN (hypertension) 07/21/2012   Smoker 07/21/2012   Home Medication(s) Prior to Admission medications   Medication Sig Start Date End Date Taking? Authorizing Provider  acetaminophen (TYLENOL) 500 MG tablet Take 1,000 mg by mouth every 6 (six) hours as needed for moderate pain or headache.    [provider]  albuterol (VENTOLIN HFA) 108 (90 Base) MCG/ACT inhaler Inhale 1-2 puffs into the lungs every 6 (six) hours as needed for wheezing or shortness of breath. 02/20/20   Cathie Hoops, Amy V, PA-C  amoxicillin-clavulanate (AUGMENTIN) 875-125 MG tablet Take 1 tablet by mouth 2 (two) times daily. 02/05/21   Waldon Merl, PA-C  aspirin 81 MG EC tablet Take 1 tablet (81 mg total) by mouth daily. 08/27/20   Little Ishikawa, MD  atorvastatin (LIPITOR) 80 MG tablet TAKE 1 TABLET(80 MG)  BY MOUTH DAILY AT 6 PM Patient taking differently: Take 80 mg by mouth every evening. 04/29/20   Marcine Matar, MD  Continuous Blood Gluc Receiver (DEXCOM G6 RECEIVER) DEVI Use as instructed to check blood sugar daily 12/06/20   Shamleffer, Konrad Dolores, MD  Continuous Blood Gluc Sensor (DEXCOM G6 SENSOR) MISC 1 Device by Does not apply route as directed. 06/20/20   Shamleffer, Konrad Dolores, MD  Continuous Blood Gluc Transmit (DEXCOM G6 TRANSMITTER) MISC 1 Device by  Does not apply route as directed. 06/20/20   Shamleffer, Konrad Dolores, MD  empagliflozin (JARDIANCE) 25 MG TABS tablet Take 1 tablet (25 mg total) by mouth daily before breakfast. 08/12/20   Shamleffer, Konrad Dolores, MD  esomeprazole (NEXIUM) 20 MG capsule Take 20 mg by mouth daily.     [provider]  ezetimibe (ZETIA) 10 MG tablet Take 1 tablet (10 mg total) by mouth daily. Patient taking differently: Take 10 mg by mouth every evening. 10/04/19 04/22/20  Little Ishikawa, MD  FLUoxetine (PROZAC) 40 MG capsule TAKE 1 CAPSULE(40 MG) BY MOUTH DAILY Patient taking differently: Take 40 mg by mouth daily. TAKE 1 CAPSULE(40 MG) BY MOUTH DAILY 04/29/20   Marcine Matar, MD  fluticasone [facility]) 50 MCG/ACT nasal spray Place 2 sprays into both nostrils daily as needed for allergies or rhinitis. 02/05/21   Waldon Merl, PA-C  furosemide (LASIX) 20 MG tablet Take 1 tablet (20 mg total) by mouth daily as needed (weight increase of 3 lbs overnight or 5 lbs in 1 week). 11/25/20   Little Ishikawa, MD  glucose blood (TRUE METRIX BLOOD GLUCOSE TEST) test strip Use as instructed 01/13/18   Marcine Matar, MD  insulin aspart (NOVOLOG) 100 UNIT/ML injection Max daily 60 units per pump 09/19/20   Shamleffer, Konrad Dolores, MD  Insulin Disposable Pump (OMNIPOD 10 PACK) MISC 1 Device by Does not apply route as directed. 06/20/20   Shamleffer, Konrad Dolores, MD  Insulin Syringes, Disposable, U-100 1 ML MISC 1 Device by Does not apply route as directed. 09/19/20   Shamleffer, Konrad Dolores, MD  lisinopril (ZESTRIL) 2.5 MG tablet Take 2.5 mg by mouth daily. 10/29/20   [provider]  Melatonin 10 MG CAPS Take 10 mg by mouth at bedtime as needed (sleep).    [provider]  metFORMIN (GLUCOPHAGE-XR) 500 MG 24 hr tablet TAKE 2 TABLETS(1000 MG) BY MOUTH DAILY WITH BREAKFAST 01/21/21   Shamleffer, Konrad Dolores, MD  metoprolol succinate (TOPROL-XL) 25 MG 24 hr tablet  TAKE 1/2 TABLET BY MOUTH EVERY DAY 02/21/21   Little Ishikawa, MD  nitroGLYCERIN (NITROSTAT) 0.4 MG SL tablet Place 0.4 mg under the tongue every 5 (five) minutes as needed for chest pain.    [provider]  omeprazole (PRILOSEC) 20 MG capsule Take 20 mg by mouth daily.    [provider]  pregabalin (LYRICA) 300 MG capsule Take 1 capsule (300 mg total) by mouth 2 (two) times daily. 11/11/20   Shamleffer, Konrad Dolores, MD  TRUEPLUS SAFETY LANCETS 28G MISC Use as directed 01/13/18   Marcine Matar, MD  Past Surgical History Past Surgical History:  Procedure Laterality Date   CARDIAC CATHETERIZATION     4-13yrs ago   CERVICAL CONIZATION W/BX N/A 05/04/2018   Procedure: CONIZATION CERVIX WITH BIOPSY - COLD KNIFE;  Surgeon: Hermina Staggers, MD;  Location: [facility];  Service: Gynecology;  Laterality: N/A;   CHOLECYSTECTOMY     CORONARY ARTERY BYPASS GRAFT N/A 10/24/2019   Procedure: CORONARY ARTERY BYPASS GRAFTING (CABG) times four, using left radial artery harvested endoscopically and right greater saphenous vein harvested endoscopically.;  Surgeon: Kerin Perna, MD;  Location[facility] OR;  Service: Open Heart Surgery;  Laterality: N/A;   ENDOMETRIAL ABLATION     6 years ago   INCISION AND DRAINAGE     for boil- buttocks   INCISION AND DRAINAGE Left    Leg, Nephrotoxin fasciotimy   IRRIGATION AND DEBRIDEMENT ABSCESS Right 03/17/2017   Procedure: IRRIGATION AND DEBRIDEMENT RIGHT THIGH ABSCESS;  Surgeon: Karie Soda, MD;  Location: [facility] ORS;  Service: General;  Laterality: Right;   LAPAROSCOPIC APPENDECTOMY  10/04/2011   Procedure: APPENDECTOMY LAPAROSCOPIC;  Surgeon: Rulon Abide, DO;  Location: [facility] ORS;  Service: General;  Laterality: N/A;   LEFT HEART CATH AND CORONARY ANGIOGRAPHY N/A 10/06/2019   Procedure: LEFT HEART  CATH AND CORONARY ANGIOGRAPHY;  Surgeon: [place]az[place], Peter M, MD;  Location: [facility] INVASIVE CV LAB;  Service: Cardiovascular;  Laterality: N/A;   RADIAL ARTERY HARVEST Left 10/24/2019   Procedure: Radial Artery Harvest;  Surgeon: Kerin Perna, MD;  Location: [facility] OR;  Service: Open Heart Surgery;  Laterality: Left;   TEE WITHOUT CARDIOVERSION N/A 10/24/2019   Procedure: TRANSESOPHAGEAL ECHOCARDIOGRAM (TEE);  Surgeon: Donata Clay, Theron Arista, MD;  Location: [facility] OR;  Service: Open Heart Surgery;  Laterality: N/A;   Family History Family History  Problem Relation Age of Onset   Aneurysm Mother    Pancreatic cancer Mother    Heart attack Father    Stroke Father    Heart disease Sister    Breast cancer Paternal Grandmother    Congestive Heart Failure Sister    Osteoarthritis Sister    Osteoarthritis Sister     Social History Social History   Tobacco Use   Smoking status: Former    Packs/day: 2.00    Years: 24.00    Pack years: 48.00    Types: E-cigarettes, Cigarettes    Quit date: 04/22/2017    Years since quitting: 3.8   Smokeless tobacco: Never   Tobacco comments:    vape  Vaping Use   Vaping Use: Some days   Devices: 1 pod last 3-4 days  Substance Use Topics   Alcohol use: Yes    Comment: rare   Drug use: No   Allergies Sulfa antibiotics and Codeine  Review of Systems Review of Systems  Respiratory:  Negative for shortness of breath.   Cardiovascular:  Negative for chest pain.  Gastrointestinal:  Negative for abdominal pain.  Neurological:  Negative for headaches.  All other systems are reviewed and are negative for acute change except as noted in the HPI  Physical Exam Vital Signs  I have reviewed the triage vital signs BP 124/76 (BP Location: Right Arm)   Pulse 77   Temp 98.1 F (36.7 C) (Oral)   Resp 18   Ht 5\' 9"  (1.753 m)   Wt 129.3 kg   SpO2 96%   BMI 42.09 kg/m   Physical Exam Vitals reviewed.  Constitutional:      General: She is  not in acute distress.     Appearance: She is well-developed. She is not diaphoretic.  HENT:     Head: Normocephalic and atraumatic.     Right Ear: External ear normal.     Left Ear: External ear normal.     Nose: Nose normal.  Eyes:     General: No scleral icterus.    Conjunctiva/sclera: Conjunctivae normal.     Pupils: Pupils are equal, round, and reactive to light. Pupils are equal.     Left eye: Corneal abrasion present. Seidel exam negative.  Neck:     Trachea: Phonation normal.  Cardiovascular:     Rate and Rhythm: Normal rate and regular rhythm.  Pulmonary:     Effort: Pulmonary effort is normal. No respiratory distress.     Breath sounds: No stridor.  Abdominal:     General: There is no distension.  Musculoskeletal:        General: Normal range of motion.     Cervical back: Normal range of motion.  Neurological:     Mental Status: She is alert and oriented to person, place, and time.  Psychiatric:        Behavior: Behavior normal.    ED Results and Treatments Labs (all labs ordered are listed, but only abnormal results are displayed) Labs Reviewed - No data to display                                                                                                                       EKG  EKG Interpretation  Date/Time:    Ventricular Rate:    PR Interval:    QRS Duration:   QT Interval:    QTC Calculation:   R Axis:     Text Interpretation:          Radiology No results found.  Pertinent labs & imaging results that were available during my care of the patient were reviewed by me and considered in my medical decision making (see chart for details).  Medications Ordered in ED Medications  erythromycin ophthalmic ointment (has no administration in time range)  tetracaine (PONTOCAINE) 0.5 % ophthalmic solution 1 drop (1 drop Left Eye Given 03/04/21 0418)  fluorescein ophthalmic strip 1 strip (1 strip Left Eye Given 03/04/21 0418)  Procedures Procedures  (including critical care time)  Medical Decision Making / ED Course I have reviewed the nursing notes for this encounter and the patient's prior records (if available in EHR or on provided paperwork).   BELMA DYCHES was evaluated in Emergency Department on 03/04/2021 for the symptoms described in the history of present illness. She was evaluated in the context of the global COVID-19 pandemic, which necessitated consideration that the patient might be at risk for infection with the SARS-CoV-2 virus that causes COVID-19. Institutional protocols and algorithms that pertain to the evaluation of patients at risk for COVID-19 are in a state of rapid change based on information released by regulatory bodies including the CDC and federal and state organizations. These policies and algorithms were followed during the patient's care in the ED.  Corneal abrasion noted on exam No evidence to suggest ruptured globe. Erythromycin ointment for treatment. Already has an eye doctor, recommended f/u.      Final Clinical Impression(s) / ED Diagnoses Final diagnoses:  Cornea abrasion, left, initial encounter   The patient appears reasonably screened and/or stabilized for discharge and I doubt any other medical condition or other [facility] requiring further screening, evaluation, or treatment in the ED at this time prior to discharge. Safe for discharge with strict return precautions.  Disposition: Discharge  Condition: Good  I have discussed the results, Dx and Tx plan with the patient/family who expressed understanding and agree(s) with the plan. Discharge instructions discussed at length. The patient/family was given strict return precautions who verbalized understanding of the instructions. No further questions at time of discharge.    ED Discharge Orders     None       Follow Up: Eye Doctor  Call   to schedule an appointment for close follow up      This chart was dictated using voice recognition software.  Despite best efforts to proofread,  errors can occur which can change the documentation meaning.    Nira Conn, MD 03/04/21 [phone]

## 2021-03-05 ENCOUNTER — Encounter: Payer: Self-pay | Admitting: Internal Medicine

## 2021-03-18 NOTE — Progress Notes (Signed)
Name: [name]  Age/ Sex: 50 y.o., female   MRN/ DOB: [id number], 19-Jun-1971     PCP: [name], MD   Reason for Endocrinology Evaluation: Type 2 Diabetes Mellitus  Initial Endocrine Consultative Visit: 11/16/2019    PATIENT IDENTIFIER: [name] is a 50 y.o. female with a past medical history of CAD, T2DM and HTN. The patient has followed with Endocrinology clinic since 11/16/2019 for consultative assistance with management of her diabetes.  DIABETIC HISTORY:  [name] was diagnosed with DM in 2004. She is intolerant to higher doses of metformin, nor to Trulicity/ Victoza  due to diarrhea. She was able to tolerate Victoza which was started 05/2019. Her hemoglobin A1c has ranged from 7.2% in 2013, peaking at >15.0% in 2019.    On her initial visit to our clinic she had an A1c of 15.1% . She was on Levemir, Metformin  And victoza and we added [place].   Victoza was stopped 05/2020 due to "severe nausea"  She was started on the Omnipod 07/2020   Victoza stopped 05/2020 due to nausea and diarrhea  Metformin reduced by 50% due to GI issues     Switched Gabapentin to Lyrica 10/2020 SUBJECTIVE:   During the last visit (11/11/2020): A1c 9.9  %.We  continued  metformin and Jardiance and adjusted pump setting       Today (03/19/2021): [name] is here for a follow up on diabetes management.  She checks her blood sugars multiple times a day through the Dexcom    She has syncope requiring hospitalization , was taken off lisinopril  She also had upper extremity fractures , requiring sling use    She has been dropping sensor  Denies nausea and diarrhea  Lyrica has worked wonder for neuropathy   Follows with nephrology       This patient with type 2 diabetes is treated with Omnipod (insulin pump). During the visit the pump basal and bolus doses were reviewed including carb/insulin rations and supplemental doses. The clinical list was updated. The  glucose meter download was reviewed in detail to determine if the current pump settings are providing the best glycemic control without excessive hypoglycemia.    Pump and meter download:    Pump   Omnipod Settings   Insulin type   NOVOLOG    Basal rate       0000 1.65 u/h               I:C ratio       0000 1:8                  Sensitivity       0000  25      Goal       0000  110-120            Type & Model of Pump: Omnipod Insulin Type: Currently using Novolog    PUMP STATISTICS: Average BG: 289 BG Readings: (1.6 / day) Average Daily Carbs (g):  83 Average Total Daily Insulin: 54 Average Daily Basal: 28.4 (61 %) Average Daily Bolus: 17.8 (39 %)      HOME DIABETES REGIMEN:  Metformin 500 mg XR , 2 tablet with breakfast  Jardiance 25 mg daily        Statin: Yes ACE-I/ARB: no     CONTINUOUS GLUCOSE MONITORING RECORD INTERPRETATION    Dates of Recording: 7/7 - 03/19/2021  Sensor description:Dexcom  Results statistics:   CGM use %  of time 64  Average and SD 266/66  Time in range    10%  % Time Above 180 35  % Time above 250 55  % Time Below target 0     Glycemic patterns summary: Hyperglycemia all day and night   Hyperglycemic episodes  All day and night   Hypoglycemic episodes occurred N/A  Overnight periods: high       DIABETIC COMPLICATIONS: Microvascular complications:  Neuropathy, retinopathy Denies: CKD Last Eye Exam: Completed   Macrovascular complications:  CAD ( S/P CABG 10/2019) Denies:  CVA, PVD   HISTORY:  Past Medical History:  Past Medical History:  Diagnosis Date   Anxiety    Arthritis    fingers   Cervical dysplasia    Coronary artery disease    Depression    Diabetes mellitus    Type II   Dyspnea 11/22/2013   GERD (gastroesophageal reflux disease)    High cholesterol    History of kidney stones    passed   Hypertension    no meds now   Kidney stones    Moderate episode of recurrent  major depressive disorder (HCC) 12/28/2019   Neuropathy    feet and legs   Nuclear sclerotic cataract of both eyes 09/2019   Dr. Shea Evans   PONV (postoperative nausea and vomiting)    Sleep apnea    lost 70lbs no cpap x79yrs now   Stage 3a chronic kidney disease (HCC) 04/29/2020   Past Surgical History:  Past Surgical History:  Procedure Laterality Date   CARDIAC CATHETERIZATION     4-70yrs ago   CERVICAL CONIZATION W/BX N/A 05/04/2018   Procedure: CONIZATION CERVIX WITH BIOPSY - COLD KNIFE;  Surgeon: Hermina Staggers, MD;  Location: [facility];  Service: Gynecology;  Laterality: N/A;   CHOLECYSTECTOMY     CORONARY ARTERY BYPASS GRAFT N/A 10/24/2019   Procedure: CORONARY ARTERY BYPASS GRAFTING (CABG) times four, using left radial artery harvested endoscopically and right greater saphenous vein harvested endoscopically.;  Surgeon: Kerin Perna, MD;  Location: [facility] OR;  Service: Open Heart Surgery;  Laterality: N/A;   ENDOMETRIAL ABLATION     6 years ago   INCISION AND DRAINAGE     for boil- buttocks   INCISION AND DRAINAGE Left    Leg, Nephrotoxin fasciotimy   IRRIGATION AND DEBRIDEMENT ABSCESS Right 03/17/2017   Procedure: IRRIGATION AND DEBRIDEMENT RIGHT THIGH ABSCESS;  Surgeon: Karie Soda, MD;  Location: [facility] ORS;  Service: General;  Laterality: Right;   LAPAROSCOPIC APPENDECTOMY  10/04/2011   Procedure: APPENDECTOMY LAPAROSCOPIC;  Surgeon: Rulon Abide, DO;  Location: [facility];  Service: General;  Laterality: N/A;   LEFT HEART CATH AND CORONARY ANGIOGRAPHY N/A 10/06/2019   Procedure: LEFT HEART CATH AND CORONARY ANGIOGRAPHY;  Surgeon: Swazil[place], Peter M, MD;  Location: [facility] INVASIVE CV LAB;  Service: Cardiovascular;  Laterality: N/A;   RADIAL ARTERY HARVEST Left 10/24/2019   Procedure: Radial Artery Harvest;  Surgeon: Kerin Perna, MD;  Location: [facility] OR;  Service: Open Heart Surgery;  Laterality: Left;   TEE WITHOUT CARDIOVERSION N/A 10/24/2019   Procedure: TRANSESOPHAGEAL  ECHOCARDIOGRAM (TEE);  Surgeon: Donata Clay, Theron Arista, MD;  Location[facility] OR;  Service: Open Heart Surgery;  Laterality: N/A;   Social History:  reports that she quit smoking about 3 years ago. Her smoking use included e-cigarettes and cigarettes. She has a 48.00 pack-year smoking history. She has never used smokeless tobacco. She reports current alcohol use. She reports that she  does not use drugs. Family History:  Family History  Problem Relation Age of Onset   Aneurysm Mother    Pancreatic cancer Mother    Heart attack Father    Stroke Father    Heart disease Sister    Breast cancer Paternal Grandmother    Congestive Heart Failure Sister    Osteoarthritis Sister    Osteoarthritis Sister      HOME MEDICATIONS: Allergies as of 03/19/2021       Reactions   Sulfa Antibiotics Hives   Codeine Itching        Medication List        Accurate as of March 19, 2021 10:00 AM. If you have any questions, ask your nurse or doctor.          acetaminophen 500 MG tablet Commonly known as: TYLENOL Take 1,000 mg by mouth every 6 (six) hours as needed for moderate pain or headache.   albuterol 108 (90 Base) MCG/ACT inhaler Commonly known as: VENTOLIN HFA Inhale 1-2 puffs into the lungs every 6 (six) hours as needed for wheezing or shortness of breath.   amoxicillin-clavulanate 875-125 MG tablet Commonly known as: AUGMENTIN Take 1 tablet by mouth 2 (two) times daily.   aspirin 81 MG EC tablet Take 1 tablet (81 mg total) by mouth daily.   atorvastatin 80 MG tablet Commonly known as: LIPITOR TAKE 1 TABLET(80 MG) BY MOUTH DAILY AT 6 PM What changed:  how much to take how to take this when to take this additional instructions   Dexcom G6 Receiver [facility] Use as instructed to check blood sugar daily   Dexcom G6 Sensor Misc 1 Device by Does not apply route as directed.   Dexcom G6 Transmitter Misc 1 Device by Does not apply route as directed.   empagliflozin 25 MG Tabs  tablet Commonly known as: Jardiance Take 1 tablet (25 mg total) by mouth daily before breakfast.   esomeprazole 20 MG capsule Commonly known as: NEXIUM Take 20 mg by mouth daily.   ezetimibe 10 MG tablet Commonly known as: ZETIA Take 1 tablet (10 mg total) by mouth daily. What changed: when to take this   FLUoxetine 40 MG capsule Commonly known as: PROZAC TAKE 1 CAPSULE(40 MG) BY MOUTH DAILY What changed:  how much to take how to take this when to take this   fluticasone 50 MCG/ACT nasal spray Commonly known as: FLONASE Place 2 sprays into both nostrils daily as needed for allergies or rhinitis.   furosemide 20 MG tablet Commonly known as: LASIX Take 1 tablet (20 mg total) by mouth daily as needed (weight increase of 3 lbs overnight or 5 lbs in 1 week).   glucose blood test strip Commonly known as: True Metrix Blood Glucose Test Use as instructed   insulin aspart 100 UNIT/ML injection Commonly known as: NovoLOG Max daily 60 units per pump   Insulin Syringes (Disposable) U-100 1 ML Misc 1 Device by Does not apply route as directed.   lisinopril 2.5 MG tablet Commonly known as: ZESTRIL Take 2.5 mg by mouth daily.   Melatonin 10 MG Caps Take 10 mg by mouth at bedtime as needed (sleep).   metFORMIN 500 MG 24 hr tablet Commonly known as: GLUCOPHAGE-XR TAKE 2 TABLETS(1000 MG) BY MOUTH DAILY WITH BREAKFAST   metoprolol succinate 25 MG 24 hr tablet Commonly known as: TOPROL-XL TAKE 1/2 TABLET BY MOUTH EVERY DAY   nitroGLYCERIN 0.4 MG SL tablet Commonly known as: NITROSTAT Place 0.4 mg under  the tongue every 5 (five) minutes as needed for chest pain.   omeprazole 20 MG capsule Commonly known as: PRILOSEC Take 20 mg by mouth daily.   OmniPod 10 Pack Misc 1 Device by Does not apply route as directed.   pregabalin 300 MG capsule Commonly known as: Lyrica Take 1 capsule (300 mg total) by mouth 2 (two) times daily.   TRUEplus Safety Lancets 28G Misc Use as  directed         OBJECTIVE:   Vital Signs: BP 108/72   Pulse 80   Ht  (1.753 m)   Wt 294 lb (133.4 kg)   SpO2 97%   BMI 43.42 kg/m     Wt Readings from Last 3 Encounters:  03/04/21 285 lb (129.3 kg)  12/27/20 273 lb 5.9 oz (124 kg)  11/22/20 275 lb (124.7 kg)     Exam: General: Pt appears well and is in NAD  Neck: General: Supple without adenopathy. Thyroid: Thyroid size normal.  No goiter or nodules appreciated.  Lungs: Clear with good BS bilat with no rales, rhonchi, or wheezes  Heart: RRR with normal S1 and S2 and no gallops; no murmurs; no rub  Abdomen: Normoactive bowel sounds, soft, nontender, without masses or organomegaly palpable  Extremities: 1+  pretibial edema on the left and trace on the right   Neuro: MS is good with appropriate affect, pt is alert and Ox3      DM foot exam: 03/19/21   The skin of the feet is intact without sores or ulcerations. The pedal pulses are undetectable on today's exam  The sensation is normal to a screening 5.07, 10 gram monofilament bilaterally   DATA REVIEWED:  Lab Results  Component Value Date   HGBA1C 9.9 (A) 11/11/2020   HGBA1C 10.9 (A) 08/12/2020   HGBA1C 15.1 (H) 10/20/2019   Lab Results  Component Value Date   MICROALBUR <0.7 08/12/2020   LDLCALC 60 08/27/2020   CREATININE 1.38 (H) 12/27/2020   Lab Results  Component Value Date   MICRALBCREAT 2.7 08/12/2020     Lab Results  Component Value Date   CHOL 152 08/27/2020   HDL 76 08/27/2020   LDLCALC 60 08/27/2020   TRIG 83 08/27/2020   CHOLHDL 2.0 08/27/2020       Results for [name], [name] (MRN [id number]) as of 03/19/2021 10:01  Ref. Range 12/27/2020 13:33  Sodium Latest Ref Range: 135 - 145 mmol/L 138  Potassium Latest Ref Range: 3.5 - 5.1 mmol/L 4.0  Chloride Latest Ref Range: 98 - 111 mmol/L 103  CO2 Latest Ref Range: 22 - 32 mmol/L 25  Glucose Latest Ref Range: 70 - 99 mg/dL 409 (H)  BUN Latest Ref Range: 6 - 20 mg/dL 46 (H)   Creatinine Latest Ref Range: 0.44 - 1.00 mg/dL 8.11 (H)  Calcium Latest Ref Range: 8.9 - 10.3 mg/dL 9.3  Anion gap Latest Ref Range: 5 - 15  10  Alkaline Phosphatase Latest Ref Range: 38 - 126 U/L 88  Albumin Latest Ref Range: 3.5 - 5.0 g/dL 3.8  AST Latest Ref Range: 15 - 41 U/L 17  ALT Latest Ref Range: 0 - 44 U/L 18  Total Protein Latest Ref Range: 6.5 - 8.1 g/dL 6.4 (L)  Total Bilirubin Latest Ref Range: 0.3 - 1.2 mg/dL 0.4  GFR, Estimated Latest Ref Range: >60 mL/min 46 (L)      ASSESSMENT / PLAN / RECOMMENDATIONS:   1) Type 2 Diabetes Mellitus, Poorly controlled, With Neuropathic,  retinopathic and macrovascular complications - Most recent A1c of 9.7 %. Goal A1c < 7.0 %.    - Her A1c continues to trend down , but there's room for improvement.  - She has been doing better with boluses, will make the following adjustments - I will make adjustments to her basal rate and I: C ratio as below      MEDICATIONS: - Continue Metformin 500 mg XR , 2 tablet with breakfast  - Continue Jardiance 25 mg , 1 tablet daily with Breakfast     Pump   Omnipod Settings   Insulin type   NOVOLOG    Basal rate       0000 2.0 u/h               I:C ratio       0000 1:6                  Sensitivity       0000  25      Goal       0000  110-120    EDUCATION / INSTRUCTIONS: BG monitoring instructions: Patient is instructed to check her blood sugars 4 times a day, preprandial and bedtime Call [place] [facility] if: BG persistently < 70  I reviewed the Rule of 15 for the treatment of hypoglycemia in detail with the patient. Literature supplied.     2) Diabetic complications:  Eye: Does  have known diabetic retinopathy.  Neuro/ Feet: Does have known diabetic peripheral neuropathy .  Renal: Patient does not have known baseline CKD. She   is not on an ACEI/ARB at present.   3) Peripheral neuropathy   -I have switched her gabapentin to Lyrica in March 2022 and this has  been working   Medication Continue Lyrica 300 twice daily   F/U in 4 months    Signed electronically by: Lyndle Herrlich, MD  [facility]  [facility] [address] [place] [place], [place] [address] Phone: [phone] FAX: [phone]   CC: [name], MD [address] [place] [address] Phone: [phone]  Fax: [phone]  Return to Endocrinology clinic as below: Future Appointments  Date Time Provider Department Center  05/28/2021 10:30 AM Little Ishikawa, MD [facility]  06/03/2021  1:00 PM Pollyann Savoy, MD [facility] None

## 2021-03-19 ENCOUNTER — Encounter: Payer: Self-pay | Admitting: Internal Medicine

## 2021-03-19 ENCOUNTER — Other Ambulatory Visit: Payer: Self-pay

## 2021-03-19 ENCOUNTER — Ambulatory Visit (INDEPENDENT_AMBULATORY_CARE_PROVIDER_SITE_OTHER): Payer: 59 | Admitting: Internal Medicine

## 2021-03-19 VITALS — BP 108/72 | HR 80 | Ht 69.0 in | Wt 294.0 lb

## 2021-03-19 DIAGNOSIS — E1142 Type 2 diabetes mellitus with diabetic polyneuropathy: Secondary | ICD-10-CM | POA: Diagnosis not present

## 2021-03-19 DIAGNOSIS — Z794 Long term (current) use of insulin: Secondary | ICD-10-CM

## 2021-03-19 LAB — POCT GLYCOSYLATED HEMOGLOBIN (HGB A1C): Hemoglobin A1C: 9.7 % — AB (ref 4.0–5.6)

## 2021-03-19 MED ORDER — EMPAGLIFLOZIN 25 MG PO TABS: 25.0000 mg | ORAL_TABLET | Freq: Every day | ORAL | 3 refills | Status: DC

## 2021-03-19 NOTE — Patient Instructions (Signed)
-   Continue Metformin 500 mg XR , 2 tablet with breakfast  - Continue  Jardiance 25 mg , 1 tablet daily with Breakfast  - Continue Lyrica 300 mg Twice daily    Pump   Omnipod Settings   Insulin type   NOVOLOG    Basal rate       0000 2.0 u/h               I:C ratio       0000 1:6                  Sensitivity       0000  25      Goal       0000  110-120     HOW TO TREAT LOW BLOOD SUGARS (Blood sugar LESS THAN 70 MG/DL) Please follow the RULE OF 15 for the treatment of hypoglycemia treatment (when your (blood sugars are less than 70 mg/dL)   STEP 1: Take 15 grams of carbohydrates when your blood sugar is low, which includes:  3-4 GLUCOSE TABS  OR 3-4 OZ OF JUICE OR REGULAR SODA OR ONE TUBE OF GLUCOSE GEL    STEP 2: RECHECK blood sugar in 15 MINUTES STEP 3: If your blood sugar is still low at the 15 minute recheck --> then, go back to STEP 1 and treat AGAIN with another 15 grams of carbohydrates.

## 2021-04-11 ENCOUNTER — Other Ambulatory Visit: Payer: Self-pay | Admitting: Internal Medicine

## 2021-04-11 DIAGNOSIS — F3342 Major depressive disorder, recurrent, in full remission: Secondary | ICD-10-CM

## 2021-04-11 NOTE — Telephone Encounter (Signed)
  Notes to clinic:  Patient has a new pcp listed  Verify still a patient for refills   Requested Prescriptions  Pending Prescriptions Disp Refills   FLUoxetine (PROZAC) 40 MG capsule [Pharmacy Med Name: FLUOXETINE 40MG  CAPSULES] 90 capsule 2    Sig: TAKE 1 CAPSULE(40 MG) BY MOUTH DAILY     Psychiatry:  Antidepressants - SSRI Failed - 04/11/2021  9:14 AM      Failed - Valid encounter within last 6 months    Recent Outpatient Visits           11 months ago Uncontrolled type 2 diabetes mellitus with diabetic neuropathy, with long-term current use of insulin (HCC)   [place] [facility] [place], [place], MD   1 year ago Uncontrolled type 2 diabetes mellitus with diabetic neuropathy, with long-term current use of insulin (HCC)   [facility], MD   1 year ago Uncontrolled type 2 diabetes mellitus with hyperosmolar nonketotic hyperglycemia (HCC)   [place] [facility] [place], [place], NP   1 year ago Viral upper respiratory tract infection   [place] [facility] [place], [place], [place]   1 year ago Uncontrolled type 2 diabetes mellitus with diabetic neuropathy, with long-term current use of insulin [facility])    [facility], MD       Future Appointments             In 1 month Marcine Matar, MD [facility] [place], [facility]   In 1 month [place], MD [facility]            Passed - Completed PHQ-2 or PHQ-9 in the last 360 days

## 2021-05-20 NOTE — Progress Notes (Signed)
Also  Office Visit Note  Patient: [name]             Date of Birth: [birth year]           MRN: [id number]             PCP: [name], MD Referring: Marcine Matar, MD Visit Date: 06/03/2021 Occupation: @GUAROCC @  Subjective:  Pain in multiple joints.   History of Present Illness: [name] is a 50 y.o. female with a history of osteoarthritis.  She returns today after almost a year later.  She states in May 2022 she fell forward on both of her hands.  She states she fractured her left wrist and left elbow.  Bring her right wrist and right elbow.  She has been going to June 2022.  She continues to have swelling in her right wrist and is a scheduled to have MRI of her right wrist joint.  She has been experiencing increased pain and discomfort in her bilateral hands.  She went to physical therapy at [facility] which has been helpful in getting some strength back in her hands.  Although it causes discomfort in her right wrist joint.  She has been experiencing some nocturnal pain over her trochanteric area.  She states that she has difficulty laying on her side.  Activities of Daily Living:  Patient reports morning stiffness for 20 minutes.   Patient Reports nocturnal pain.  Difficulty dressing/grooming: Denies Difficulty climbing stairs: Denies Difficulty getting out of chair: Reports Difficulty using hands for taps, buttons, cutlery, and/or writing: Reports  Review of Systems  Constitutional:  Positive for fatigue. Negative for night sweats, weight gain and weight loss.  HENT:  Positive for mouth dryness. Negative for mouth sores, trouble swallowing, trouble swallowing and nose dryness.   Eyes:  Positive for dryness. Negative for pain, redness and visual disturbance.  Respiratory:  Negative for cough, shortness of breath and difficulty breathing.   Cardiovascular:  Positive for swelling in legs/feet. Negative for chest pain, palpitations, hypertension and  irregular heartbeat.  Gastrointestinal:  Negative for blood in stool, constipation and diarrhea.  Endocrine: Negative for increased urination.  Genitourinary:  Negative for vaginal dryness.  Musculoskeletal:  Positive for joint pain, joint pain, joint swelling, myalgias, morning stiffness and myalgias. Negative for muscle weakness and muscle tenderness.  Skin:  Negative for color change, rash, hair loss, skin tightness, ulcers and sensitivity to sunlight.  Allergic/Immunologic: Negative for susceptible to infections.  Neurological:  Negative for dizziness, memory loss, night sweats and weakness.  Hematological:  Negative for swollen glands.  Psychiatric/Behavioral:  Positive for depressed mood and sleep disturbance. The patient is not nervous/anxious.    PMFS History:  Patient Active Problem List   Diagnosis Date Noted  . Dehydration 12/27/2020  . Type 2 diabetes mellitus with hyperglycemia, with long-term current use of insulin (HCC) 11/11/2020  . Stage 3a chronic kidney disease (HCC) 04/29/2020  . COVID-19 virus vaccination declined 04/29/2020  . Influenza vaccination declined 04/29/2020  . Midline sternotomy scar 04/17/2020  . Left rib fracture 03/13/2020  . Moderate episode of recurrent major depressive disorder (HCC) 12/28/2019  . Encounter for post surgical wound check 12/06/2019  . Type 2 diabetes mellitus with diabetic polyneuropathy, with long-term current use of insulin (HCC) 11/16/2019  . Diabetes mellitus (HCC) 11/16/2019  . Type 2 diabetes mellitus with retinopathy, with long-term current use of insulin (HCC) 11/16/2019  . Dyslipidemia 11/16/2019  . Postop check 11/15/2019  . Diabetic retinopathy (HCC)  10/25/2019  . 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 Angina pectoris (HCC) 10/06/2019  . Left-sided chest pain 06/13/2019  . Post-operative state 06/06/2018  . HGSIL (high grade squamous intraepithelial lesion) on Pap smear of cervix 02/25/2018  . Right upper quadrant abdominal pain  12/30/2017  . Polyarthritis 12/30/2017  . OSA (obstructive sleep apnea) 11/22/2013  . Dyspnea 11/22/2013  . Uncontrolled type 2 diabetes mellitus with hyperosmolar nonketotic hyperglycemia (HCC) 07/21/2012  . HTN (hypertension) 07/21/2012  . Smoker 07/21/2012    Past Medical History:  Diagnosis Date  . Anxiety   . Arthritis    fingers  . Cervical dysplasia   . Coronary artery disease   . Depression   . Diabetes mellitus    Type II  . Dyspnea 11/22/2013  . GERD (gastroesophageal reflux disease)   . High cholesterol   . History of kidney stones    passed  . Hypertension    no meds now  . Kidney stones   . Moderate episode of recurrent major depressive disorder (HCC) 12/28/2019  . Neuropathy    feet and legs  . Nuclear sclerotic cataract of both eyes 09/2019   Dr. Shea Evans  . PONV (postoperative nausea and vomiting)   . Sleep apnea    lost 70lbs no cpap x56yrs now  . Stage 3a chronic kidney disease (HCC) 04/29/2020    Family History  Problem Relation Age of Onset  . Aneurysm Mother   . Pancreatic cancer Mother   . Heart attack Father   . Stroke Father   . Heart disease Sister   . Breast cancer Paternal Grandmother   . Congestive Heart Failure Sister   . Osteoarthritis Sister   . Osteoarthritis Sister    Past Surgical History:  Procedure Laterality Date  . CARDIAC CATHETERIZATION     4-67yrs ago  . CERVICAL CONIZATION W/BX N/A 05/04/2018   Procedure: CONIZATION CERVIX WITH BIOPSY - COLD KNIFE;  Surgeon: Hermina Staggers, MD;  Location: [facility];  Service: Gynecology;  Laterality: N/A;  . CHOLECYSTECTOMY    . CORONARY ARTERY BYPASS GRAFT N/A 10/24/2019   Procedure: CORONARY ARTERY BYPASS GRAFTING (CABG) times four, using left radial artery harvested endoscopically and right greater saphenous vein harvested endoscopically.;  Surgeon: Kerin Perna, MD;  Location: [facility] OR;  Service: Open Heart Surgery;  Laterality: N/A;  . ENDOMETRIAL ABLATION     6 years ago   . INCISION AND DRAINAGE     for boil- buttocks  . INCISION AND DRAINAGE Left    Leg, Nephrotoxin fasciotimy  . IRRIGATION AND DEBRIDEMENT ABSCESS Right 03/17/2017   Procedure: IRRIGATION AND DEBRIDEMENT RIGHT THIGH ABSCESS;  Surgeon: Karie Soda, MD;  Location: [facility] ORS;  Service: General;  Laterality: Right;  . LAPAROSCOPIC APPENDECTOMY  10/04/2011   Procedure: APPENDECTOMY LAPAROSCOPIC;  Surgeon: Rulon Abide, DO;  Location: [facility] ORS;  Service: General;  Laterality: N/A;  . LEFT HEART CATH AND CORONARY ANGIOGRAPHY N/A 10/06/2019   Procedure: LEFT HEART CATH AND CORONARY ANGIOGRAPHY;  Surgeon: Swaziland, Peter M, MD;  Location: [facility] INVASIVE CV LAB;  Service: Cardiovascular;  Laterality: N/A;  . RADIAL ARTERY HARVEST Left 10/24/2019   Procedure: Radial Artery Harvest;  Surgeon: Kerin Perna, MD;  Location: [facility] OR;  Service: Open Heart Surgery;  Laterality: Left;  . TEE WITHOUT CARDIOVERSION N/A 10/24/2019   Procedure: TRANSESOPHAGEAL ECHOCARDIOGRAM (TEE);  Surgeon: Donata Clay, Theron Arista, MD;  Location: [facility] OR;  Service: Open Heart Surgery;  Laterality: N/A;   Social  History   Social History Narrative  . Not on file   Immunization History  Administered Date(s) Administered  . Influenza Split 05/31/2013  . Influenza,inj,Quad PF,6+ Mos 06/05/2019  . Pneumococcal Polysaccharide-23 03/29/2017  . Tdap 01/27/2018     Objective: Vital Signs: BP 126/84 (BP Location: Left Arm, Patient Position: Sitting, Cuff Size: Large)   Pulse 71   Resp 12   Ht 5\' 9"  (1.753 m)   Wt (!) 304 lb 6.4 oz (138.1 kg)   BMI 44.95 kg/m    Physical Exam Vitals and nursing note reviewed.  Constitutional:      Appearance: She is well-developed.  HENT:     Head: Normocephalic and atraumatic.  Eyes:     Conjunctiva/sclera: Conjunctivae normal.  Cardiovascular:     Rate and Rhythm: Normal rate and regular rhythm.     Heart sounds: Normal heart sounds.  Pulmonary:     Effort: Pulmonary effort is normal.     Breath  sounds: Normal breath sounds.  Abdominal:     General: Bowel sounds are normal.     Palpations: Abdomen is soft.  Musculoskeletal:     Cervical back: Normal range of motion.  Lymphadenopathy:     Cervical: No cervical adenopathy.  Skin:    General: Skin is warm and dry.     Capillary Refill: Capillary refill takes less than 2 seconds.  Neurological:     Mental Status: She is alert and oriented to person, place, and time.  Psychiatric:        Behavior: Behavior normal.     Musculoskeletal Exam: C-spine was in good range of motion.  She had discomfort range of motion of her left shoulder joint.  She had no synovitis or elbow joints.  She had tenderness over the extensor tendons of the right forearm.  There was no synovitis of her wrist joints.  No synovitis over MCPs was noted.  Bilateral PIP and DIP thickening was noted.  Hip joints with good range of motion.  She had tenderness over bilateral trochanteric bursa.  CDAI Exam: CDAI Score: -- Patient Global: --; Provider Global: -- Swollen: --; Tender: -- Joint Exam 06/03/2021   No joint exam has been documented for this visit   There is currently no information documented on the homunculus. Go to the Rheumatology activity and complete the homunculus joint exam.  Investigation: No additional findings.  Imaging: No results found.  Recent Labs: Lab Results  Component Value Date   WBC 5.6 12/27/2020   HGB 12.6 12/27/2020   PLT 172 12/27/2020   NA 142 05/30/2021   K 4.6 05/30/2021   CL 101 05/30/2021   CO2 25 05/30/2021   GLUCOSE 268 (H) 05/30/2021   BUN 29 (H) 05/30/2021   CREATININE 1.53 (H) 05/30/2021   BILITOT 0.4 12/27/2020   ALKPHOS 88 12/27/2020   AST 17 12/27/2020   ALT 18 12/27/2020   PROT 6.4 (L) 12/27/2020   ALBUMIN 3.8 12/27/2020   CALCIUM 9.4 05/30/2021   GFRAA 51 (L) 10/18/2020    Speciality Comments: No specialty comments available.  Procedures:  No procedures performed Allergies: Sulfa antibiotics  and Codeine   Assessment / Plan:     Visit Diagnoses: Chronic left shoulder pain -she continues to have left shoulder joint discomfort.  She was encouraged to do shoulder joint exercises.  X-ray was unremarkable   Primary osteoarthritis of both hands -she continues to have discomfort and stiffness in her hands clinical and radiographic findings are consistent with osteoarthritis.  She had no synovitis on examination.  She went through physical therapy which was helpful.  Joint protection muscle strengthening was discussed.  Natural anti-inflammatories were discussed.  Trochanteric bursitis of both hips-she had tenderness on palpation bilateral trochanteric bursa consistent with trochanteric bursitis.  A handout on IT band stretches was given.  I will also refer her to physical therapy.  Chronic pain of left knee -she complains of left knee joint pain.  History of intermittent swelling.  No synovitis was noted.  X-rays obtained at the last visit were unremarkable.  Primary osteoarthritis of both feet - X-rays were consistent with osteoarthritis. All autoimmune work-up was negative.  She is currently not having much discomfort in her feet.  History of recent fall-patient fell in May 2022.  She states she fractured her left wrist and left elbow.  She has been going to [facility].  She sprained her right wrist and still had some swelling over her right forearm extensor tendons.  According to the patient she is scheduled to have MRI of her forearm.  Weight gain-BMI 44.95.  Patient states she gained a lot of weight due to inactivity.  Gradual exercising and dietary modifications were discussed.  Other medical problems are listed as follows:  Essential hypertension  S/P CABG x 4  Uncontrolled type 2 diabetes mellitus with hyperosmolar nonketotic hyperglycemia (HCC)  Dyslipidemia  History of gastroesophageal reflux (GERD)  History of kidney stones  Nuclear sclerotic cataract of both  eyes  OSA (obstructive sleep apnea)  Anxiety and depression  HGSIL (high grade squamous intraepithelial lesion) on Pap smear of cervix  Former smoker  Orders: Orders Placed This Encounter  Procedures  . Ambulatory referral to Physical Therapy    No orders of the defined types were placed in this encounter.   Follow-Up Instructions: Return in about 6 months (around 12/02/2021) for Osteoarthritis.   Pollyann Savoy, MD  Note - This record has been created using Animal nutritionist.  Chart creation errors have been sought, but may not always  have been located. Such creation errors do not reflect on  the standard of medical care.

## 2021-05-26 NOTE — Progress Notes (Signed)
Cardiology Office Note:    Date:  05/30/2021   ID:  [name], DOB 12-06-1970, MRN [id number]  PCP:  [name], MD  Cardiologist:  Little Ishikawa, MD  Electrophysiologist:  None   Referring MD: [name], *   Chief Complaint  Patient presents with   Coronary Artery Disease    History of Present Illness:    [name] is a 50 y.o. female with a hx of coronary artery disease status post CABG x4 on 10/24/2019 (LIMA-LAD, radial-diagonal, SVG-diagonal, SVG-PDA), type 2 diabetes, hypertension, hyperlipidemia who presents for a follow-up evaluation.  Patient was admitted to [facility] on 06/13/19 with chest pain.  EKG was unremarkable and high-sensitivity troponins were negative.  However due to her extensive risk factors including uncontrolled diabetes, coronary CTA was ordered.  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  She was discharged on medical management: atorvastatin 80 mg, Imdur 30 mg.  TTE showed EF 60-65%.  During subsequent office visits, her antianginal regimen was titrated as she continued to report chest pain.  At last clinic visit on 10/02/2019, she was on metoprolol 25 mg twice daily and Imdur 60 mg daily, but further titration was limited by low blood pressures.  Given inability to further titrate up antianginals and having persistent chest pain, cardiac catheterization was ordered.  Underwent cath on 10/06/2019, which showed three-vessel CAD: 80% mid LAD, 85% slitlike ostial D1, 80% ostial D2, 80% small PDA, anomalous circumflex off the RCA cusp with moderate diffuse disease.  Recommendation was for aggressive medical management and if  symptoms persist consider surgical consultation.  She continued to have chest pain despite medical management, so was referred to cardiac surgery.  She underwent CABG x4 on 10/24/2019 (LIMA-LAD, radial-diagonal, SVG-diagonal, SVG-PDA) with Dr. Donata Clay.  Echocardiogram on 09/20/2018 showed biventricular function, no significant valvular disease.  Stress CMR on 11/05/2020 showed no evidence of ischemia, no LGE, LVEF 61%, RVEF 60%.  Since last clinic visit, she reports that she has been doing okay.  She was started on lisinopril by her nephrologist but developed hypotension, had ED visit in April 2022.  Has been off lisinopril since that time.  She also had fall and broke multiple bones in her arm.  Reports may need surgery on left arm.  She is also been struggling with depression since her son moved out.  Has gained nearly 30 pounds over the last 6 months.  Reports chest pain has resolved.  Denies any dyspnea, lightheadedness, syncope, or palpitations.  Does continue to report intermittent lower extremity edema.  Wt Readings from Last 3 Encounters:  05/28/21 (!) 304 lb (137.9 kg)  03/19/21 294 lb (133.4 kg)  03/04/21 285 lb (129.3 kg)     Past Medical History:  Diagnosis Date   Anxiety    Arthritis    fingers   Cervical dysplasia    Coronary artery disease    Depression    Diabetes mellitus    Type II   Dyspnea 11/22/2013   GERD (gastroesophageal reflux disease)    High cholesterol    History of kidney stones    passed   Hypertension    no meds now   Kidney stones  Moderate episode of recurrent major depressive disorder (HCC) 12/28/2019   Neuropathy    feet and legs   Nuclear sclerotic cataract of both eyes 09/2019   Dr. Shea Evans   PONV (postoperative nausea and vomiting)    Sleep apnea    lost 70lbs no cpap x53yrs now   Stage 3a chronic kidney disease (HCC) 04/29/2020    Past Surgical History:  Procedure Laterality Date   CARDIAC CATHETERIZATION     4-21yrs ago   CERVICAL CONIZATION  W/BX N/A 05/04/2018   Procedure: CONIZATION CERVIX WITH BIOPSY - COLD KNIFE;  Surgeon: Hermina Staggers, MD;  Location: [facility];  Service: Gynecology;  Laterality: N/A;   CHOLECYSTECTOMY     CORONARY ARTERY BYPASS GRAFT N/A 10/24/2019   Procedure: CORONARY ARTERY BYPASS GRAFTING (CABG) times four, using left radial artery harvested endoscopically and right greater saphenous vein harvested endoscopically.;  Surgeon: Kerin Perna, MD;  Location: [facility] OR;  Service: Open Heart Surgery;  Laterality: N/A;   ENDOMETRIAL ABLATION     6 years ago   INCISION AND DRAINAGE     for boil- buttocks   INCISION AND DRAINAGE Left    Leg, Nephrotoxin fasciotimy   IRRIGATION AND DEBRIDEMENT ABSCESS Right 03/17/2017   Procedure: IRRIGATION AND DEBRIDEMENT RIGHT THIGH ABSCESS;  Surgeon: Karie Soda, MD;  Location: [facility];  Service: General;  Laterality: Right;   LAPAROSCOPIC APPENDECTOMY  10/04/2011   Procedure: APPENDECTOMY LAPAROSCOPIC;  Surgeon: Rulon Abide, DO;  Location: [facility];  Service: General;  Laterality: N/A;   LEFT HEART CATH AND CORONARY ANGIOGRAPHY N/A 10/06/2019   Procedure: LEFT HEART CATH AND CORONARY ANGIOGRAPHY;  Surgeon: Swaziland, Peter M, MD;  Location: [facility];  Service: Cardiovascular;  Laterality: N/A;   RADIAL ARTERY HARVEST Left 10/24/2019   Procedure: Radial Artery Harvest;  Surgeon: Kerin Perna, MD;  Location: [facility] OR;  Service: Open Heart Surgery;  Laterality: Left;   TEE WITHOUT CARDIOVERSION N/A 10/24/2019   Procedure: TRANSESOPHAGEAL ECHOCARDIOGRAM (TEE);  Surgeon: Donata Clay, Theron Arista, MD;  Location: [facility] OR;  Service: Open Heart Surgery;  Laterality: N/A;    Current Medications: Current Meds  Medication Sig   acetaminophen (TYLENOL) 500 MG tablet Take 1,000 mg by mouth every 6 (six) hours as needed for moderate pain or headache.   albuterol (VENTOLIN HFA) 108 (90 Base) MCG/ACT inhaler Inhale 1-2 puffs into the lungs every 6 (six) hours as needed for  wheezing or shortness of breath.   aspirin 81 MG EC tablet Take 1 tablet (81 mg total) by mouth daily.   atorvastatin (LIPITOR) 80 MG tablet TAKE 1 TABLET(80 MG) BY MOUTH DAILY AT 6 PM (Patient taking differently: Take 80 mg by mouth every evening.)   Continuous Blood Gluc Receiver (DEXCOM G6 RECEIVER) DEVI Use as instructed to check blood sugar daily   Continuous Blood Gluc Sensor (DEXCOM G6 SENSOR) MISC 1 Device by Does not apply route as directed.   Continuous Blood Gluc Transmit (DEXCOM G6 TRANSMITTER) MISC 1 Device by Does not apply route as directed.   empagliflozin (JARDIANCE) 25 MG TABS tablet Take 1 tablet (25 mg total) by mouth daily before breakfast.   esomeprazole (NEXIUM) 20 MG capsule Take 20 mg by mouth daily.    FLUoxetine (PROZAC) 40 MG capsule TAKE 1 CAPSULE(40 MG) BY MOUTH DAILY (Patient taking differently: Take 40 mg by mouth daily. TAKE 1 CAPSULE(40 MG) BY MOUTH DAILY)   fluticasone (FLONASE) 50 MCG/ACT nasal spray Place 2 sprays into  both nostrils daily as needed for allergies or rhinitis.   furosemide (LASIX) 20 MG tablet Take 1 tablet (20 mg total) by mouth daily as needed (weight increase of 3 lbs overnight or 5 lbs in 1 week).   glucose blood (TRUE METRIX BLOOD GLUCOSE TEST) test strip Use as instructed   insulin aspart (NOVOLOG) 100 UNIT/ML injection Max daily 60 units per pump   Insulin Disposable Pump (OMNIPOD 10 PACK) MISC 1 Device by Does not apply route as directed.   Insulin Syringes, Disposable, U-100 1 ML MISC 1 Device by Does not apply route as directed.   lisinopril (ZESTRIL) 2.5 MG tablet Take 2.5 mg by mouth daily.   Melatonin 10 MG CAPS Take 10 mg by mouth at bedtime as needed (sleep).   metFORMIN (GLUCOPHAGE-XR) 500 MG 24 hr tablet TAKE 2 TABLETS(1000 MG) BY MOUTH DAILY WITH BREAKFAST   metoprolol succinate (TOPROL-XL) 25 MG 24 hr tablet TAKE 1/2 TABLET BY MOUTH EVERY DAY   nitroGLYCERIN (NITROSTAT) 0.4 MG SL tablet Place 0.4 mg under the tongue every 5  (five) minutes as needed for chest pain.   omeprazole (PRILOSEC) 20 MG capsule Take 20 mg by mouth daily.   pregabalin (LYRICA) 300 MG capsule Take 1 capsule (300 mg total) by mouth 2 (two) times daily.   TRUEPLUS SAFETY LANCETS 28G MISC Use as directed   [DISCONTINUED] amoxicillin-clavulanate (AUGMENTIN) 875-125 MG tablet Take 1 tablet by mouth 2 (two) times daily.     Allergies:   Sulfa antibiotics and Codeine   Social History   Socioeconomic History   Marital status: Widowed    Spouse name: Not on file   Number of children: 1   Years of education: Not on file   Highest education level: Not on file  Occupational History   Occupation: none  Tobacco Use   Smoking status: Former    Packs/day: 2.00    Years: 24.00    Pack years: 48.00    Types: E-cigarettes, Cigarettes    Quit date: 04/22/2017    Years since quitting: 4.1   Smokeless tobacco: Never   Tobacco comments:    vape  Vaping Use   Vaping Use: Some days   Devices: 1 pod last 3-4 days  Substance and Sexual Activity   Alcohol use: Yes    Comment: rare   Drug use: No   Sexual activity: Yes    Birth control/protection: None  Other Topics Concern   Not on file  Social History Narrative   Not on file   Social Determinants of Health   Financial Resource Strain: Not on file  Food Insecurity: Not on file  Transportation Needs: Not on file  Physical Activity: Not on file  Stress: Not on file  Social Connections: Not on file     Family History: The patient's family history includes Aneurysm in her mother; Breast cancer in her paternal grandmother; Congestive Heart Failure in her sister; Heart attack in her father; Heart disease in her sister; Osteoarthritis in her sister and sister; Pancreatic cancer in her mother; Stroke in her father.  ROS:   Please see the history of present illness.     All other systems reviewed and are negative.  EKGs/Labs/Other Studies Reviewed:    The following studies were reviewed  today:   EKG:  EKG is ordered today.  The ekg ordered today demonstrates normal sinus rhythm, rate 63, right bundle branch block, no ST abnormalities  TTE 06/14/19:  1. Left ventricular ejection fraction, by  visual estimation, is 60 to 65%. The left ventricle has normal function. Normal left ventricular size. There is no left ventricular hypertrophy.  2. Left ventricular diastolic Doppler parameters are consistent with impaired relaxation pattern of LV diastolic filling.  3. Global right ventricle has normal systolic function.The right ventricular size is normal. No increase in right ventricular wall thickness.  4. Left atrial size was normal.  5. Right atrial size was normal.  6. The mitral valve is normal in structure. No evidence of mitral valve regurgitation. No evidence of mitral stenosis.  7. The tricuspid valve is normal in structure. Tricuspid valve regurgitation was not visualized by color flow Doppler.  8. The aortic valve is normal in structure. Aortic valve regurgitation was not visualized by color flow Doppler. Structurally normal aortic valve, with no evidence of sclerosis or stenosis.  9. The pulmonic valve was normal in structure. Pulmonic valve regurgitation is not visualized by color flow Doppler. 10. The inferior vena cava is normal in size with greater than 50% respiratory variability, suggesting right atrial pressure of 3 mmHg.  Coronary CT 06/15/19: 1. Coronary calcium score of 81. This was 31 percentile for age and sex matched control. 2. 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 Noncalcified plaque in mid LCX appears to cause severe stenosis (70-99%) but vessel is small (<86mm) 3. Calcified plaque in the proximal LAD causes 25-49% stenosis  CTFFR 06/15/19: 1. CT-FFR across lesion in proximal LAD is 0.92, suggesting lesion is not functionally significant 2. CT-FFR across lesion in ostial D2 is 0.62, suggesting  functional significance 3. CT-FFR across lesion in anomalous circumflex is 0.65, suggesting functional significance  Cath 10/06/19: Prox Cx lesion is 60% stenosed. Mid Cx lesion is 70% stenosed. RPDA lesion is 80% stenosed. Mid LM to Ost LAD lesion is 35% stenosed. 1st Diag lesion is 85% stenosed. 2nd Diag lesion is 80% stenosed. Mid LAD lesion is 80% stenosed. LV end diastolic pressure is normal.   1. Three vessel CAD.    - 80% mid LAD after the second diagonal    - 85% slit like stenosis at the origin of the first diagonal- best seen in the LAO caudal shot.    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 Normal LVEDP   Plan: reviewed with interventional colleagues. Her anatomy doesn't offer good PCI options except the mid LAD. Treating the ostial disease in the diagonal branches would likely require stenting into the LAD. The LCx and PDA are too small for PCI. I would aggressively treat medically. If symptoms persist I would consider surgical consultation.      Recent Labs: 08/27/2020: BNP 57.6; TSH 2.090 11/22/2020: Magnesium 2.1 12/27/2020: ALT 18; BUN 46; Creatinine, Ser 1.38; Hemoglobin 12.6; Platelets 172; Potassium 4.0; Sodium 138  Recent Lipid Panel    Component Value Date/Time   CHOL 152 08/27/2020 1227   TRIG 83 08/27/2020 1227   HDL 76 08/27/2020 1227   CHOLHDL 2.0 08/27/2020 1227   CHOLHDL 5.5 06/14/2019 0009   VLDL 47 (H) 06/14/2019 0009   LDLCALC 60 08/27/2020 1227    Physical Exam:    VS:  BP 138/87   Pulse 63   Ht 5\' 8"  (1.727 m)   Wt (!) 304 lb (137.9 kg)   SpO2 100%   BMI 46.22 kg/m     Wt Readings from Last 3  Encounters:  05/28/21 (!) 304 lb (137.9 kg)  03/19/21 294 lb (133.4 kg)  03/04/21 285 lb (129.3 kg)    GEN:  in no acute distress HEENT: Normal NECK: No JVD LYMPHATICS: No lymphadenopathy CARDIAC: RRR, no murmurs, rubs, gallops RESPIRATORY:  Clear to  auscultation without rales, wheezing or rhonchi  ABDOMEN: Soft, non-tender, non-distended MUSCULOSKELETAL:  No edema; No deformity  SKIN: Warm and dry.   NEUROLOGIC:  Alert and oriented x 3 PSYCHIATRIC:  Normal affect   ASSESSMENT:    1. CAD in native artery   2. S/P CABG (coronary artery bypass graft)   3. Lower extremity edema   4. Medication management   5. OSA (obstructive sleep apnea)   6. Snoring   7. Daytime somnolence   8. Class 3 severe obesity with body mass index (BMI) of 45.0 to 49.9 in adult, unspecified obesity type, unspecified whether serious comorbidity present (HCC)     PLAN:    Coronary artery disease: three-vessel CAD on cath 10/06/19: 80% mid LAD, 85% slitlike ostial D1, 80% ostial D2, 80% small PDA, anomalous circumflex off the RCA cusp with moderate diffuse disease.   Status post CABG x4 on 10/24/2019 (LIMA-LAD, radial-diagonal, SVG-diagonal, SVG-PDA).  Normal LV systolic function on echo 06/14/2019.  Stress CMR on 11/05/2020 showed no evidence of ischemia, no LGE, LVEF 61%, RVEF 60%.  Currently denies any anginal symptoms. -Continue aspirin 81 mg daily  -Continue atorvastatin 80 mg daily and Zetia 10 mg daily. LDL 60 on 08/27/20 -Continue Toprol-XL 12.5 mg daily  Lower extremity edema: No structural heart disease on echocardiogram.  No DVT on duplex.Marland Kitchen  BNP was normal.  Has improved with PO lasix 20 mg daily, will continue.  Check BMET/magnesium.  Hyperlipidemia: LDL 204 on 06/14/19.  Started on atorvastatin 80 mg daily and Zetia 10 mg daily.  LDL 60 on 08/27/2020  Tobacco use: smoked 2ppd x 30 years, has quit smoking but now vaping.  Have discussed risks of vaping and cessation strongly recommended  Type 2 diabetes: A1c 9.7% on 03/19/2021, down from 15.1 on 10/20/2019.  On insulin pump, Metformin, Jardiance.  Follows with endocrinology.    OSA: Reports was previously diagnosed but has been off CPAP for years.  Will check sleep study  Morbid obesity: Body mass  index is 46.22 kg/m.  Will refer to healthy weight and wellness   RTC in 6 months     Medication Adjustments/Labs and Tests Ordered: Current medicines are reviewed at length with the patient today.  Concerns regarding medicines are outlined above.  Orders Placed This Encounter  Procedures   Basic metabolic panel   Magnesium   Brain natriuretic peptide   Ambulatory referral to [facility]   EKG 12-Lead   Split night study    No orders of the defined types were placed in this encounter.   Patient Instructions  Medication Instructions:  Your physician recommends that you continue on your current medications as directed. Please refer to the Current Medication list given to you today.  *If you need a refill on your cardiac medications before your next appointment, please call your pharmacy*   Lab Work: BMET, Mag, BNP today  If you have labs (blood work) drawn today and your tests are completely normal, you will receive your results only by: MyChart Message (if you have MyChart) OR A paper copy in the mail If you have any lab test that is abnormal or we need to change your treatment, we will call you to review  the results.   Testing/Procedures: Your physician has recommended that you have a sleep study. This test records several body functions during sleep, including: brain activity, eye movement, oxygen and carbon dioxide blood levels, heart rate and rhythm, breathing rate and rhythm, the flow of air through your mouth and nose, snoring, body muscle movements, and chest and belly movement.  Follow-Up: At [facility], you and your health needs are our priority.  As part of our continuing mission to provide you with exceptional heart care, we have created designated Provider Care Teams.  These Care Teams include your primary Cardiologist (physician) and Advanced Practice Providers (APPs -  Physician Assistants and Nurse Practitioners) who all work together to provide you  with the care you need, when you need it.  We recommend signing up for the patient portal called "MyChart".  Sign up information is provided on this After Visit Summary.  MyChart is used to connect with patients for Virtual Visits (Telemedicine).  Patients are able to view lab/test results, encounter notes, upcoming appointments, etc.  Non-urgent messages can be sent to your provider as well.   To learn more about what you can do with MyChart, go to [email or web address].    Your next appointment:   6 month(s)  The format for your next appointment:   In Person  Provider:   Epifanio Lesches, MD   Other Instructions You have been referred to: [facility]    Signed, Little Ishikawa, MD  05/30/2021 2:11 PM    [facility]

## 2021-05-28 ENCOUNTER — Ambulatory Visit (INDEPENDENT_AMBULATORY_CARE_PROVIDER_SITE_OTHER): Payer: 59 | Admitting: Cardiology

## 2021-05-28 ENCOUNTER — Other Ambulatory Visit: Payer: Self-pay

## 2021-05-28 VITALS — BP 138/87 | HR 63 | Ht 68.0 in | Wt 304.0 lb

## 2021-05-28 DIAGNOSIS — R400 Somnolence: Secondary | ICD-10-CM

## 2021-05-28 DIAGNOSIS — I2510 Atherosclerotic heart disease of native coronary artery without angina pectoris: Secondary | ICD-10-CM

## 2021-05-28 DIAGNOSIS — Z79899 Other long term (current) drug therapy: Secondary | ICD-10-CM

## 2021-05-28 DIAGNOSIS — Z951 Presence of aortocoronary bypass graft: Secondary | ICD-10-CM

## 2021-05-28 DIAGNOSIS — R600 Localized edema: Secondary | ICD-10-CM

## 2021-05-28 DIAGNOSIS — R0683 Snoring: Secondary | ICD-10-CM

## 2021-05-28 DIAGNOSIS — G4733 Obstructive sleep apnea (adult) (pediatric): Secondary | ICD-10-CM

## 2021-05-28 DIAGNOSIS — Z6841 Body Mass Index (BMI) 40.0 and over, adult: Secondary | ICD-10-CM

## 2021-05-28 NOTE — Patient Instructions (Signed)
Medication Instructions:  Your physician recommends that you continue on your current medications as directed. Please refer to the Current Medication list given to you today.  *If you need a refill on your cardiac medications before your next appointment, please call your pharmacy*   Lab Work: BMET, Mag, BNP today  If you have labs (blood work) drawn today and your tests are completely normal, you will receive your results only by: MyChart Message (if you have MyChart) OR A paper copy in the mail If you have any lab test that is abnormal or we need to change your treatment, we will call you to review the results.   Testing/Procedures: Your physician has recommended that you have a sleep study. This test records several body functions during sleep, including: brain activity, eye movement, oxygen and carbon dioxide blood levels, heart rate and rhythm, breathing rate and rhythm, the flow of air through your mouth and nose, snoring, body muscle movements, and chest and belly movement.  Follow-Up: At [facility], you and your health needs are our priority.  As part of our continuing mission to provide you with exceptional heart care, we have created designated Provider Care Teams.  These Care Teams include your primary Cardiologist (physician) and Advanced Practice Providers (APPs -  Physician Assistants and Nurse Practitioners) who all work together to provide you with the care you need, when you need it.  We recommend signing up for the patient portal called "MyChart".  Sign up information is provided on this After Visit Summary.  MyChart is used to connect with patients for Virtual Visits (Telemedicine).  Patients are able to view lab/test results, encounter notes, upcoming appointments, etc.  Non-urgent messages can be sent to your provider as well.   To learn more about what you can do with MyChart, go to [email or web address].    Your next appointment:   6 month(s)  The format for  your next appointment:   In Person  Provider:   Epifanio Lesches, MD   Other Instructions You have been referred to: [facility]

## 2021-05-30 ENCOUNTER — Telehealth: Payer: Self-pay | Admitting: *Deleted

## 2021-05-30 NOTE — Telephone Encounter (Signed)
-----   Message from Harvel Ricks, RN sent at 05/28/2021 11:22 AM EDT ----- Regarding: split night Split night ordered  [place] [address]  Thanks!

## 2021-05-30 NOTE — Telephone Encounter (Signed)
Prior Authorization for split night sleep study sent to [facility] via web portal. Tracking Number [id number].

## 2021-05-31 LAB — BRAIN NATRIURETIC PEPTIDE: BNP: 56.6 pg/mL (ref 0.0–100.0)

## 2021-05-31 LAB — BASIC METABOLIC PANEL
BUN/Creatinine Ratio: 19 (ref 9–23)
BUN: 29 mg/dL — ABNORMAL HIGH (ref 6–24)
CO2: 25 mmol/L (ref 20–29)
Calcium: 9.4 mg/dL (ref 8.7–10.2)
Chloride: 101 mmol/L (ref 96–106)
Creatinine, Ser: 1.53 mg/dL — ABNORMAL HIGH (ref 0.57–1.00)
Glucose: 268 mg/dL — ABNORMAL HIGH (ref 70–99)
Potassium: 4.6 mmol/L (ref 3.5–5.2)
Sodium: 142 mmol/L (ref 134–144)
eGFR: 41 mL/min/{1.73_m2} — ABNORMAL LOW (ref >59)

## 2021-05-31 LAB — MAGNESIUM: Magnesium: 2 mg/dL (ref 1.6–2.3)

## 2021-06-02 ENCOUNTER — Telehealth: Payer: Self-pay | Admitting: Internal Medicine

## 2021-06-02 ENCOUNTER — Other Ambulatory Visit: Payer: Self-pay | Admitting: Internal Medicine

## 2021-06-02 NOTE — Telephone Encounter (Signed)
Patient notified that script has been sent.

## 2021-06-02 NOTE — Telephone Encounter (Signed)
Received a denial from [facility] for split night sleep study.  Ok for [employer]?

## 2021-06-02 NOTE — Telephone Encounter (Signed)
Script was sent to [facility] via Fax on 06/02/2021 at 3:42pm

## 2021-06-02 NOTE — Telephone Encounter (Signed)
Patient called re: RX for the following medication needs to be resent (showing as a No Print). Please resend the following RX:  MEDICATION:  pregabalin (LYRICA) 300 MG capsule  PHARMACY:   [id number] [place], [place] - [address] AT [facility] Phone:  [phone]  Fax:  [phone]      HAS THE PATIENT CONTACTED THEIR PHARMACY?  Yes-PHARM did not receive RX (No Print)  IS THIS A 90 DAY SUPPLY : Yes  IS PATIENT OUT OF MEDICATION: Yes  IF NOT; HOW MUCH IS LEFT: 0  LAST APPOINTMENT DATE: @7 /20/2022  NEXT APPOINTMENT DATE:@11 /21/2022  DO WE HAVE YOUR PERMISSION TO LEAVE A DETAILED MESSAGE?: Yes  OTHER COMMENTS:    **Let patient know to contact pharmacy at the end of the day to make sure medication is ready. **  ** Please notify patient to allow 48-72 hours to process**  **Encourage patient to contact the pharmacy for refills or they can request refills through [facility]**

## 2021-06-03 ENCOUNTER — Encounter: Payer: Self-pay | Admitting: Rheumatology

## 2021-06-03 ENCOUNTER — Ambulatory Visit (INDEPENDENT_AMBULATORY_CARE_PROVIDER_SITE_OTHER): Payer: 59 | Admitting: Rheumatology

## 2021-06-03 ENCOUNTER — Other Ambulatory Visit: Payer: Self-pay

## 2021-06-03 VITALS — BP 126/84 | HR 71 | Resp 12 | Ht 69.0 in | Wt 304.4 lb

## 2021-06-03 DIAGNOSIS — E1100 Type 2 diabetes mellitus with hyperosmolarity without nonketotic hyperglycemic-hyperosmolar coma (NKHHC): Secondary | ICD-10-CM

## 2021-06-03 DIAGNOSIS — R87613 High grade squamous intraepithelial lesion on cytologic smear of cervix (HGSIL): Secondary | ICD-10-CM

## 2021-06-03 DIAGNOSIS — R635 Abnormal weight gain: Secondary | ICD-10-CM

## 2021-06-03 DIAGNOSIS — G4733 Obstructive sleep apnea (adult) (pediatric): Secondary | ICD-10-CM

## 2021-06-03 DIAGNOSIS — M7061 Trochanteric bursitis, right hip: Secondary | ICD-10-CM | POA: Diagnosis not present

## 2021-06-03 DIAGNOSIS — H2513 Age-related nuclear cataract, bilateral: Secondary | ICD-10-CM

## 2021-06-03 DIAGNOSIS — M19072 Primary osteoarthritis, left ankle and foot: Secondary | ICD-10-CM

## 2021-06-03 DIAGNOSIS — M25512 Pain in left shoulder: Secondary | ICD-10-CM

## 2021-06-03 DIAGNOSIS — M19041 Primary osteoarthritis, right hand: Secondary | ICD-10-CM | POA: Diagnosis not present

## 2021-06-03 DIAGNOSIS — Z87442 Personal history of urinary calculi: Secondary | ICD-10-CM

## 2021-06-03 DIAGNOSIS — M7062 Trochanteric bursitis, left hip: Secondary | ICD-10-CM

## 2021-06-03 DIAGNOSIS — F32A Depression, unspecified: Secondary | ICD-10-CM

## 2021-06-03 DIAGNOSIS — F419 Anxiety disorder, unspecified: Secondary | ICD-10-CM

## 2021-06-03 DIAGNOSIS — E785 Hyperlipidemia, unspecified: Secondary | ICD-10-CM

## 2021-06-03 DIAGNOSIS — M19071 Primary osteoarthritis, right ankle and foot: Secondary | ICD-10-CM

## 2021-06-03 DIAGNOSIS — M25562 Pain in left knee: Secondary | ICD-10-CM | POA: Diagnosis not present

## 2021-06-03 DIAGNOSIS — G8929 Other chronic pain: Secondary | ICD-10-CM

## 2021-06-03 DIAGNOSIS — Z951 Presence of aortocoronary bypass graft: Secondary | ICD-10-CM

## 2021-06-03 DIAGNOSIS — Z87891 Personal history of nicotine dependence: Secondary | ICD-10-CM

## 2021-06-03 DIAGNOSIS — Z8719 Personal history of other diseases of the digestive system: Secondary | ICD-10-CM

## 2021-06-03 DIAGNOSIS — Z9181 History of falling: Secondary | ICD-10-CM

## 2021-06-03 DIAGNOSIS — I10 Essential (primary) hypertension: Secondary | ICD-10-CM

## 2021-06-03 DIAGNOSIS — M19042 Primary osteoarthritis, left hand: Secondary | ICD-10-CM

## 2021-06-03 NOTE — Patient Instructions (Signed)
Iliotibial Band Syndrome Rehab Ask your health care provider which exercises are safe for you. Do exercises exactly as told by your health care provider and adjust them as directed. It is normal to feel mild stretching, pulling, tightness, or discomfort as you do these exercises. Stop right away if you feel sudden pain or your pain gets significantly worse. Do not begin these exercises until told by your health care provider. Stretching and range-of-motion exercises These exercises warm up your muscles and joints and improve the movement andflexibility of your hip and pelvis. Quadriceps stretch, prone  Lie on your abdomen (prone position) on a firm surface, such as a bed or padded floor. Bend your left / right knee and reach back to hold your ankle or pant leg. If you cannot reach your ankle or pant leg, loop a belt around your foot and grab the belt instead. Gently pull your heel toward your buttocks. Your knee should not slide out to the side. You should feel a stretch in the front of your thigh and knee (quadriceps). Hold this position for __________ seconds. Repeat __________ times. Complete this exercise __________ times a day. Iliotibial band stretch An iliotibial band is a strong band of muscle tissue that runs from the outer side of your hip to the outer side of your thigh and knee. Lie on your side with your left / right leg in the top position. Bend both of your knees and grab your left / right ankle. Stretch out your bottom arm to help you balance. Slowly bring your top knee back so your thigh goes behind your trunk. Slowly lower your top leg toward the floor until you feel a gentle stretch on the outside of your left / right hip and thigh. If you do not feel a stretch and your knee will not fall farther, place the heel of your other foot on top of your knee and pull your knee down toward the floor with your foot. Hold this position for __________ seconds. Repeat __________ times.  Complete this exercise __________ times a day. Strengthening exercises These exercises build strength and endurance in your hip and pelvis. Enduranceis the ability to use your muscles for a long time, even after they get tired. Straight leg raises, side-lying This exercise strengthens the muscles that rotate the leg at the hip and move it away from your body (hip abductors). Lie on your side with your left / right leg in the top position. Lie so your head, shoulder, hip, and knee line up. You may bend your bottom knee to help you balance. Roll your hips slightly forward so your hips are stacked directly over each other and your left / right knee is facing forward. Tense the muscles in your outer thigh and lift your top leg 4-6 inches (10-15 cm). Hold this position for __________ seconds. Slowly lower your leg to return to the starting position. Let your muscles relax completely before doing another repetition. Repeat __________ times. Complete this exercise __________ times a day. Leg raises, prone This exercise strengthens the muscles that move the hips backward (hip extensors). Lie on your abdomen (prone position) on your bed or a firm surface. You can put a pillow under your hips if that is more comfortable for your lower back. Bend your left / right knee so your foot is straight up in the air. Squeeze your buttocks muscles and lift your left / right thigh off the bed. Do not let your back arch. Tense your thigh   muscle as hard as you can without increasing any knee pain. Hold this position for __________ seconds. Slowly lower your leg to return to the starting position and allow it to relax completely. Repeat __________ times. Complete this exercise __________ times a day. Hip hike Stand sideways on a bottom step. Stand on your left / right leg with your other foot unsupported next to the step. You can hold on to a railing or wall for balance if needed. Keep your knees straight and your  torso square. Then lift your left / right hip up toward the ceiling. Slowly let your left / right hip lower toward the floor, past the starting position. Your foot should get closer to the floor. Do not lean or bend your knees. Repeat __________ times. Complete this exercise __________ times a day. This information is not intended to replace advice given to you by your health care provider. Make sure you discuss any questions you have with your healthcare provider. Document Revised: 10/25/2019 Document Reviewed: 10/25/2019 Elsevier Patient Education  2022 [employer].  

## 2021-06-04 ENCOUNTER — Other Ambulatory Visit: Payer: Self-pay | Admitting: Cardiology

## 2021-06-04 DIAGNOSIS — I10 Essential (primary) hypertension: Secondary | ICD-10-CM

## 2021-06-04 DIAGNOSIS — G4733 Obstructive sleep apnea (adult) (pediatric): Secondary | ICD-10-CM

## 2021-06-04 NOTE — Telephone Encounter (Signed)
PA request for HST submitted to [facility] via portal. No PA is required. Per [facility] portal. Appointment scheduled and message left for patient to check her mychart for appointment details.

## 2021-06-18 ENCOUNTER — Other Ambulatory Visit: Payer: Self-pay | Admitting: Cardiology

## 2021-06-21 IMAGING — DX DG CHEST 2V
2 series · 2 of 2 positions shown · non-contrast
Comparison: October 28, 2019.

CLINICAL DATA: Status post coronary bypass graft.

EXAM:
CHEST - 2 VIEW

[dg chest 2 view (1 of 2)]
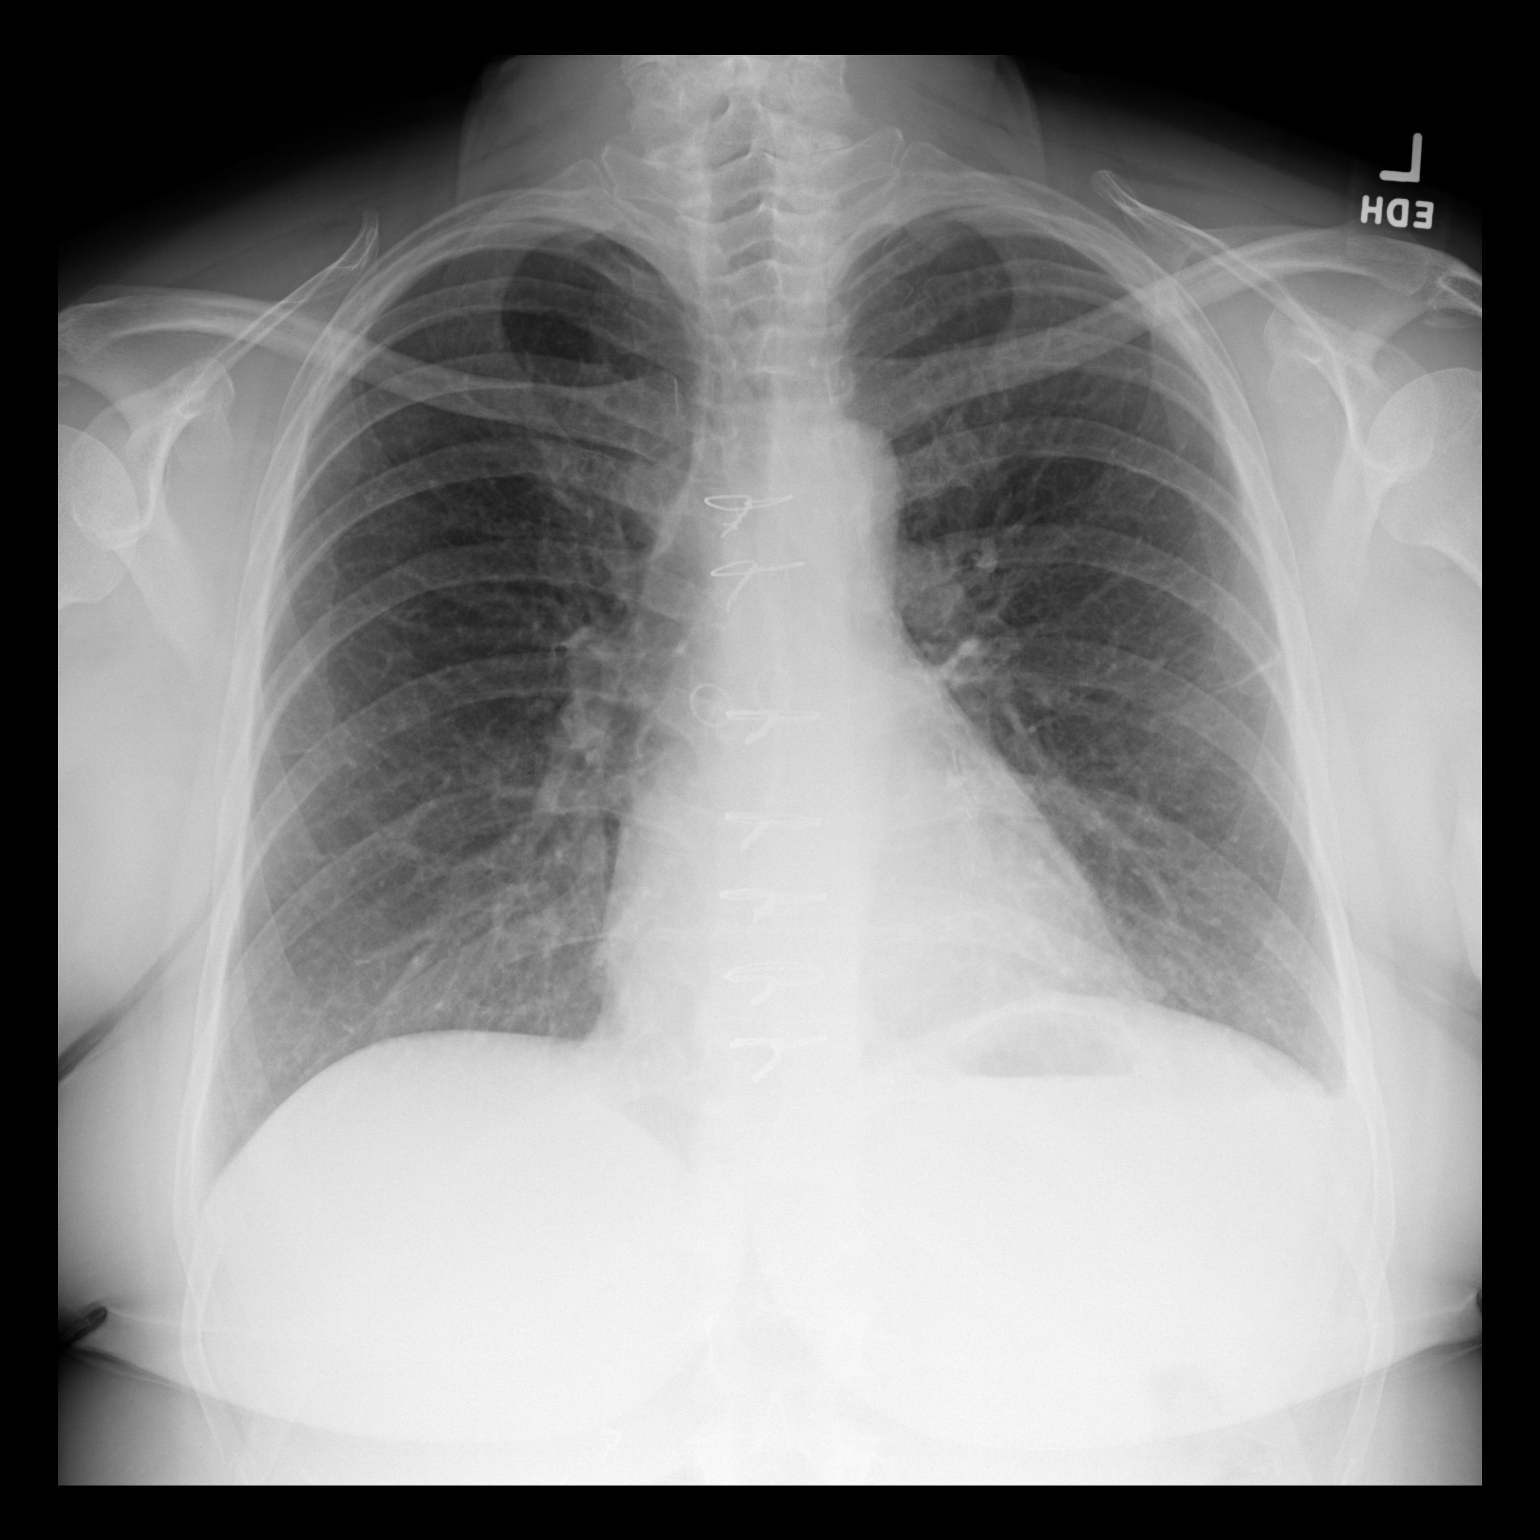

[dg chest 2 view (2 of 2)]
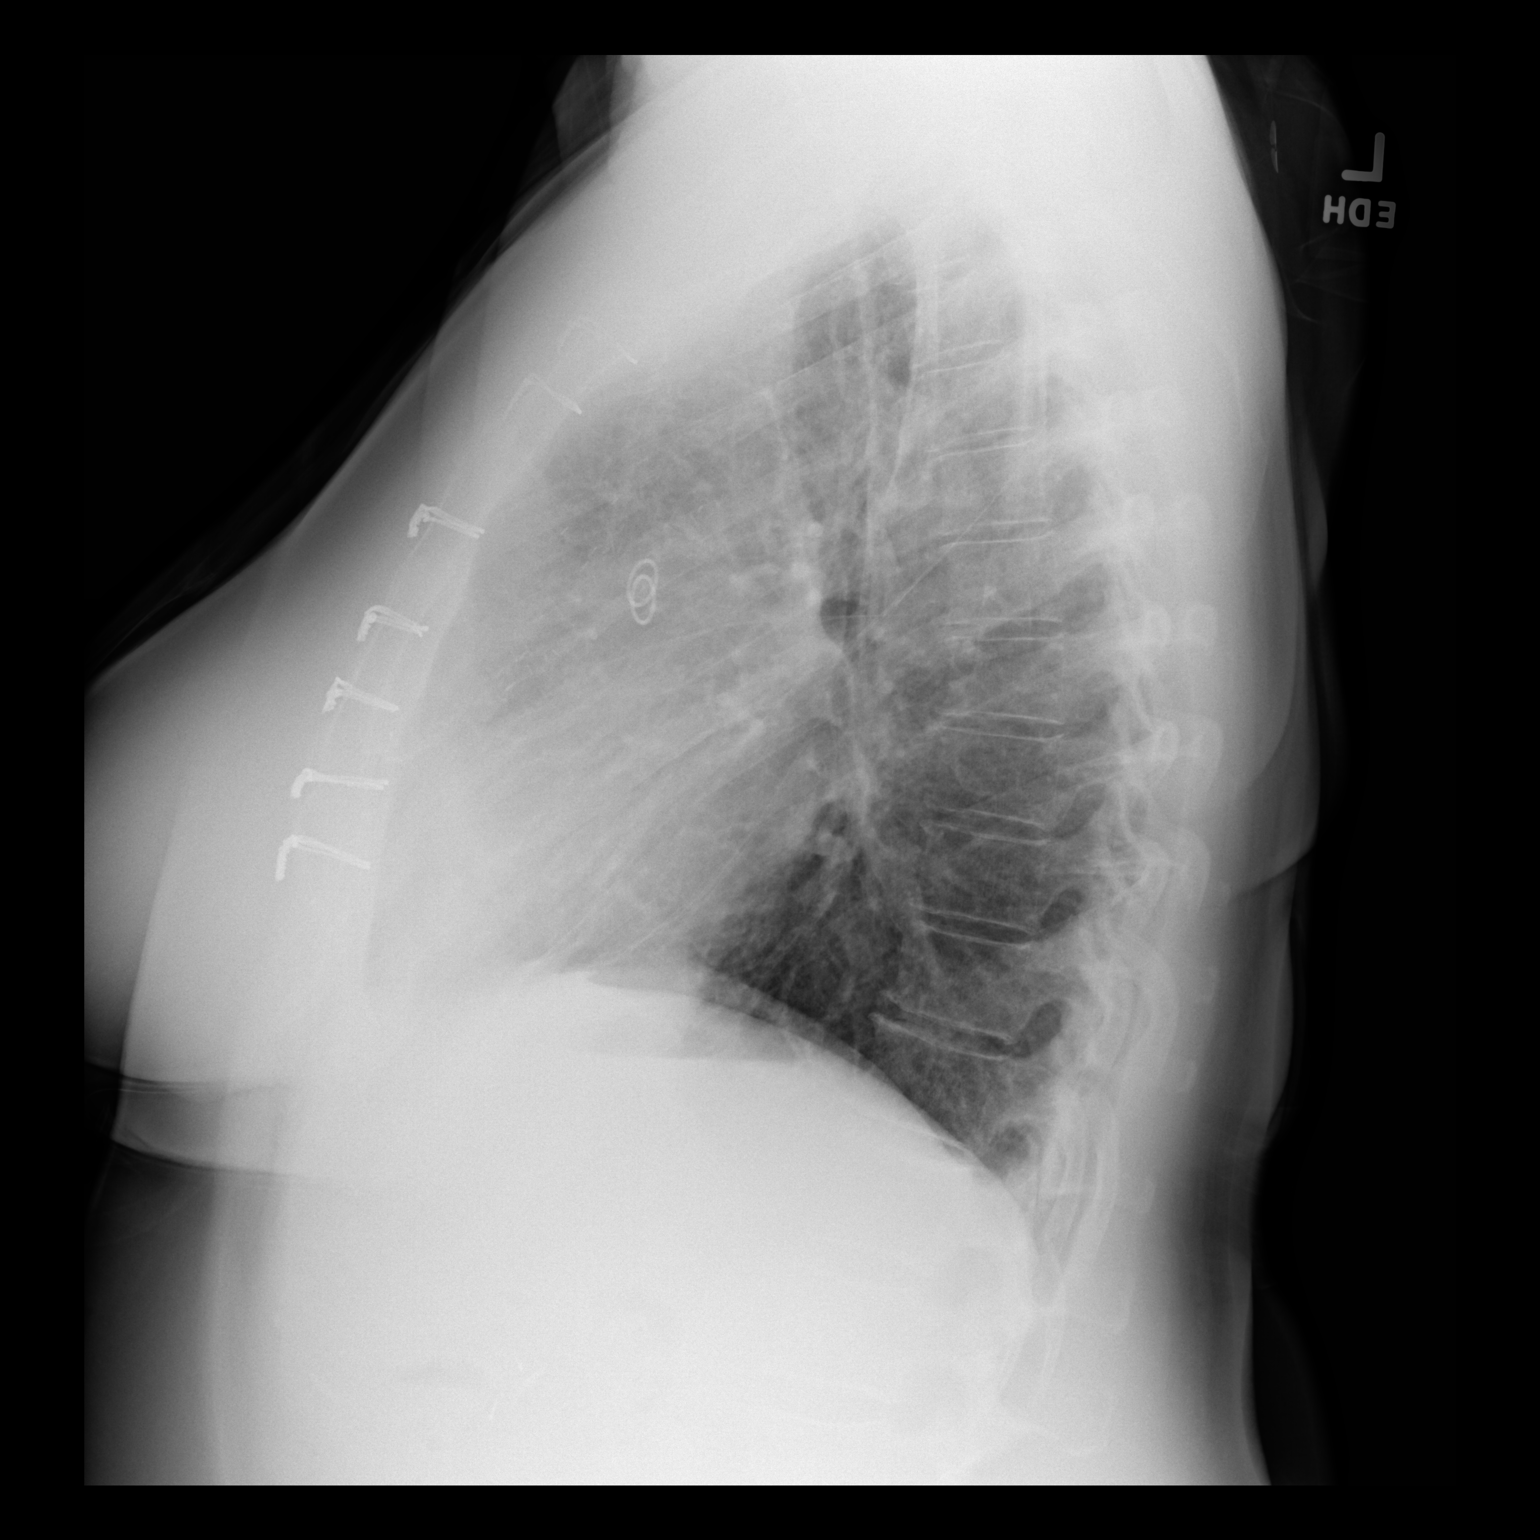

[2 of 2 positions shown; findings below may reference images not displayed]

FINDINGS: The heart size and mediastinal contours are within normal limits. No
pneumothorax or pleural effusion is noted. Status post coronary
bypass graft. Both lungs are clear. The visualized skeletal
structures are unremarkable.
IMPRESSION: No active cardiopulmonary disease.

## 2021-06-24 ENCOUNTER — Other Ambulatory Visit: Payer: Self-pay

## 2021-06-24 ENCOUNTER — Encounter (HOSPITAL_BASED_OUTPATIENT_CLINIC_OR_DEPARTMENT_OTHER): Payer: Self-pay | Admitting: Orthopedic Surgery

## 2021-06-24 NOTE — Progress Notes (Signed)
Chart reviewed by Dr. Malen Gauze. No need for cardiac or medical clearance prior to sx.

## 2021-06-24 NOTE — Progress Notes (Signed)
Reviewed chart with Dr. Malen Gauze a second time. Patient is not a candidate for surgery at the [facility] d/t length of surgery combined with pt being on an insulin pump and uncontrolled blood sugar levels.

## 2021-07-01 ENCOUNTER — Encounter (HOSPITAL_COMMUNITY): Payer: Self-pay | Admitting: Orthopedic Surgery

## 2021-07-01 ENCOUNTER — Other Ambulatory Visit: Payer: Self-pay

## 2021-07-01 MED ORDER — LACTATED RINGERS IV SOLN: INTRAVENOUS | Status: DC

## 2021-07-01 NOTE — H&P (Signed)
[name] is an 50 y.o. female.   Chief Complaint: RIGHT WRIST AND ELBOW PAIN  HPI: The patient is a 50 y/o right hand dominant female who tripped over a dog bed on 01/17/21 and caught herself with the right arm. She has continued to have pain, weakness, swelling, and limited motion in the right arm. MRI was done on 06/13/21 due to the continuation of pain without improvement with conservative treatment. MRI showed the tear at the lateral epicondylar tendon and tenosynovitis in the wrist.  She is here today for surgery.  She denies chest pain, shortness of breath, fever, chills, nausea, vomiting, or diarrhea.     Past Medical History:  Diagnosis Date   Anxiety    Arthritis    fingers   Cervical dysplasia    Coronary artery disease    Depression    Diabetes mellitus    Type II   Dyspnea 11/22/2013   GERD (gastroesophageal reflux disease)    High cholesterol    History of kidney stones    passed   Hypertension    no meds now   Kidney stones    Moderate episode of recurrent major depressive disorder ([place]) 12/28/2019   Neuropathy    feet and legs   Nuclear sclerotic cataract of both eyes 09/2019   Dr. Idolina Primer   PONV (postoperative nausea and vomiting)    one time   Sleep apnea    lost 70lbs no cpap x69yrs now   Stage 3a chronic kidney disease ([place]) 04/29/2020    Past Surgical History:  Procedure Laterality Date   CARDIAC CATHETERIZATION     4-49yrs ago   CERVICAL CONIZATION W/BX N/A 05/04/2018   Procedure: CONIZATION CERVIX WITH BIOPSY - COLD KNIFE;  Surgeon: Chancy Milroy, MD;  Location: [place];  Service: Gynecology;  Laterality: N/A;   CHOLECYSTECTOMY     CORONARY ARTERY BYPASS GRAFT N/A 10/24/2019   Procedure: CORONARY ARTERY BYPASS GRAFTING (CABG) times four, using left radial artery harvested endoscopically and right greater saphenous vein harvested endoscopically.;  Surgeon: Ivin Poot, MD;  Location: [place];  Service: Open Heart Surgery;   Laterality: N/A;   ENDOMETRIAL ABLATION     6 years ago   INCISION AND DRAINAGE     for boil- buttocks   INCISION AND DRAINAGE Left    Leg, Nephrotoxin fasciotimy   IRRIGATION AND DEBRIDEMENT ABSCESS Right 03/17/2017   Procedure: IRRIGATION AND DEBRIDEMENT RIGHT THIGH ABSCESS;  Surgeon: Michael Boston, MD;  Location: [facility];  Service: General;  Laterality: Right;   LAPAROSCOPIC APPENDECTOMY  10/04/2011   Procedure: APPENDECTOMY LAPAROSCOPIC;  Surgeon: Judieth Keens, DO;  Location: [facility];  Service: General;  Laterality: N/A;   LEFT HEART CATH AND CORONARY ANGIOGRAPHY N/A 10/06/2019   Procedure: LEFT HEART CATH AND CORONARY ANGIOGRAPHY;  Surgeon: Martinique, Peter M, MD;  Location: [place] [facility];  Service: Cardiovascular;  Laterality: N/A;   RADIAL ARTERY HARVEST Left 10/24/2019   Procedure: Radial Artery Harvest;  Surgeon: Ivin Poot, MD;  Location: [place];  Service: Open Heart Surgery;  Laterality: Left;   TEE WITHOUT CARDIOVERSION N/A 10/24/2019   Procedure: TRANSESOPHAGEAL ECHOCARDIOGRAM (TEE);  Surgeon: Prescott Gum, Collier Salina, MD;  Location: [place];  Service: Open Heart Surgery;  Laterality: N/A;    Family History  Problem Relation Age of Onset   Aneurysm Mother    Pancreatic cancer Mother    Heart attack Father    Stroke Father  Heart disease Sister    Breast cancer Paternal Grandmother    Congestive Heart Failure Sister    Osteoarthritis Sister    Osteoarthritis Sister    Social History:  reports that she quit smoking about 4 years ago. Her smoking use included e-cigarettes and cigarettes. She has a 48.00 pack-year smoking history. She has never used smokeless tobacco. She reports current alcohol use. She reports that she does not use drugs.  Allergies:  Allergies  Allergen Reactions   Sulfa Antibiotics Hives   Codeine Itching    No medications prior to admission.    No results found for this or any previous visit (from the past 48 hour(s)). No results found.  ROS NO  RECENT ILLNESSES OR HOSPITALIZATIONS  Height 5\' 9"  (1.753 m), weight (!) 138.3 kg. Physical Exam  General Appearance:  Alert, cooperative, no distress, appears stated age  Head:  Normocephalic, without obvious abnormality, atraumatic  Eyes:  Pupils equal, conjunctiva/corneas clear,         Throat: Lips, mucosa, and tongue normal; teeth and gums normal  Neck: No visible masses     Lungs:   respirations unlabored  Chest Wall:  No tenderness or deformity  Heart:  Regular rate and rhythm,  Abdomen:   Soft, non-tender,         Extremities: RUE: skin intact, fingers warm well perfused Nvi right hand,   Pulses: 2+ and symmetric  Skin: Skin color, texture, turgor normal, no rashes or lesions     Neurologic: Normal     Assessment/Plan RIGHT WRIST SIXTH DORSAL COMPARTMENT TENOSYNOVITIS     - RIGHT WRIST SIXTH DORSAL COMPARTMENT TENOSYNOVECTOMY AND TENDON REPAIR  RIGHT ELBOW LATERAL EPICONDYLAR TENDON TEAR     - RIGHT ELBOW LATERAL EPICONDYLAR DEBRIDEMENT AND TENDON REPAIR   R/B/A DISCUSSED WITH PT IN OFFICE.  PT VOICED UNDERSTANDING OF PLAN CONSENT SIGNED DAY OF SURGERY PT SEEN AND EXAMINED PRIOR TO OPERATIVE PROCEDURE/DAY OF SURGERY SITE MARKED. QUESTIONS ANSWERED WILL GO HOME FOLLOWING SURGERY   WE ARE PLANNING SURGERY FOR YOUR UPPER EXTREMITY. THE RISKS AND BENEFITS OF SURGERY INCLUDE BUT NOT LIMITED TO BLEEDING INFECTION, DAMAGE TO NEARBY NERVES ARTERIES TENDONS, FAILURE OF SURGERY TO ACCOMPLISH ITS INTENDED GOALS, PERSISTENT SYMPTOMS AND NEED FOR FURTHER SURGICAL INTERVENTION. WITH THIS IN MIND WE WILL PROCEED. I HAVE DISCUSSED WITH THE PATIENT THE PRE AND POSTOPERATIVE REGIMEN AND THE DOS AND DON'TS. PT VOICED UNDERSTANDING AND INFORMED CONSENT SIGNED.   Viviene Thurston MD   Melvyn Novas 07/01/2021, 9:02 AM

## 2021-07-01 NOTE — Progress Notes (Addendum)
Ms [name] denies chest pain or shortness of breath. Patient denies having any s/s of Covid in her household.  Patient denies any known exposure to Covid.   Ms [name] has a history of sleep apnea, when she she last 70 lbs, she stopped using the CPAP. Patient  is scheduled to have a repeat sleep study. Ms [name] has type II diabetes. Patient wears a Dexcom 6 and an omipod.  Ms [name] states that  CBG this am was 125. Patient had taken Jardiance this am. Patient said that she will stop the Insulin pump tomorrow around 1200, I told her she can continue to wear it. [name] said she will be ok without it. I instructed patient to check CBG after awaking and every 2 hours until arrival  to the hospital.  I Instructed patient if CBG is less than 70 to take 4 Glucose Tablets or 1 tube of Glucose Gel or 1/2 cup of a clear juice. Recheck CBG in 15 minutes if CBG is not over 70 call, pre- op desk at [phone] for further instructions. If scheduled to receive Insulin, do not take Insulin.  I instructed patient to shower with antibiotic soap, if it is available.  Dry off with a clean towel. Do not put lotion, powder, cologne or deodorant or makeup.No jewelry or piercings. Men may shave their face and neck. Woman should not shave. No nail polish, artificial or acrylic nails. Wear clean clothes, brush your teeth. Glasses, contact lens,dentures or partials may not be worn in the OR. If you need to wear them, please bring a case for glasses, do not wear contacts or bring a case, the hospital does not have contact cases, dentures or partials will have to be removed , make sure they are clean, we will provide a denture cup to put them in. You will need some one to drive you home and a responsible person over the age of 33 to stay with you for the first 24 hours after surgery.    I spoke to Christena Deem, Diabetic Coordinator.  Shannon instructed me to have Ms [name] call Dr.Shanleffer to  ask for instructions, "she may  be given a steroid in surgery and change in setting may be necessary."  I informed Ms [name] the information that Carollee Herter gave me. Patient said she will  just wear the omipod.

## 2021-07-02 ENCOUNTER — Encounter (HOSPITAL_COMMUNITY)
Admission: RE | Disposition: A | Discharge status: Home / Self Care | Payer: Self-pay | Source: Home / Self Care | Attending: Orthopedic Surgery

## 2021-07-02 ENCOUNTER — Ambulatory Visit (HOSPITAL_COMMUNITY): Payer: 59 | Admitting: Anesthesiology

## 2021-07-02 ENCOUNTER — Ambulatory Visit (HOSPITAL_COMMUNITY)
Admission: RE | Admit: 2021-07-02 | Discharge: 2021-07-02 | Disposition: A | Discharge status: Home / Self Care | Payer: 59 | Attending: Orthopedic Surgery | Admitting: Orthopedic Surgery

## 2021-07-02 ENCOUNTER — Encounter (HOSPITAL_COMMUNITY): Payer: Self-pay | Admitting: Orthopedic Surgery

## 2021-07-02 ENCOUNTER — Encounter (HOSPITAL_BASED_OUTPATIENT_CLINIC_OR_DEPARTMENT_OTHER): Payer: 59 | Admitting: Cardiovascular Disease

## 2021-07-02 DIAGNOSIS — S66811A Strain of other specified muscles, fascia and tendons at wrist and hand level, right hand, initial encounter: Secondary | ICD-10-CM | POA: Diagnosis not present

## 2021-07-02 DIAGNOSIS — W228XXA Striking against or struck by other objects, initial encounter: Secondary | ICD-10-CM | POA: Diagnosis not present

## 2021-07-02 DIAGNOSIS — Z885 Allergy status to narcotic agent status: Secondary | ICD-10-CM | POA: Diagnosis not present

## 2021-07-02 DIAGNOSIS — E1140 Type 2 diabetes mellitus with diabetic neuropathy, unspecified: Secondary | ICD-10-CM | POA: Insufficient documentation

## 2021-07-02 DIAGNOSIS — Z794 Long term (current) use of insulin: Secondary | ICD-10-CM

## 2021-07-02 DIAGNOSIS — Z882 Allergy status to sulfonamides status: Secondary | ICD-10-CM | POA: Diagnosis not present

## 2021-07-02 DIAGNOSIS — Z87891 Personal history of nicotine dependence: Secondary | ICD-10-CM | POA: Diagnosis not present

## 2021-07-02 DIAGNOSIS — M65831 Other synovitis and tenosynovitis, right forearm: Secondary | ICD-10-CM | POA: Diagnosis present

## 2021-07-02 DIAGNOSIS — M7711 Lateral epicondylitis, right elbow: Secondary | ICD-10-CM

## 2021-07-02 DIAGNOSIS — E1165 Type 2 diabetes mellitus with hyperglycemia: Secondary | ICD-10-CM

## 2021-07-02 HISTORY — PX: TENDON RECONSTRUCTION: SHX2487

## 2021-07-02 LAB — GLUCOSE, CAPILLARY
Glucose-Capillary: 109 mg/dL — ABNORMAL HIGH (ref 70–99)
Glucose-Capillary: 71 mg/dL (ref 70–99)
Glucose-Capillary: 86 mg/dL (ref 70–99)
Glucose-Capillary: 107 mg/dL — ABNORMAL HIGH (ref 70–99)
Glucose-Capillary: 82 mg/dL (ref 70–99)

## 2021-07-02 LAB — BASIC METABOLIC PANEL
Anion gap: 11 (ref 5–15)
BUN: 25 mg/dL — ABNORMAL HIGH (ref 6–20)
CO2: 23 mmol/L (ref 22–32)
Calcium: 9.4 mg/dL (ref 8.9–10.3)
Chloride: 105 mmol/L (ref 98–111)
Creatinine, Ser: 1.43 mg/dL — ABNORMAL HIGH (ref 0.44–1.00)
GFR, Estimated: 44 mL/min — ABNORMAL LOW (ref >60)
Glucose, Bld: 105 mg/dL — ABNORMAL HIGH (ref 70–99)
Potassium: 4.3 mmol/L (ref 3.5–5.1)
Sodium: 139 mmol/L (ref 135–145)

## 2021-07-02 LAB — CBC
HCT: 40.2 % (ref 36.0–46.0)
Hemoglobin: 12.1 g/dL (ref 12.0–15.0)
MCH: 23.2 pg — ABNORMAL LOW (ref 26.0–34.0)
MCHC: 30.1 g/dL (ref 30.0–36.0)
MCV: 77.2 fL — ABNORMAL LOW (ref 80.0–100.0)
Platelets: 192 10*3/uL (ref 150–400)
RBC: 5.21 MIL/uL — ABNORMAL HIGH (ref 3.87–5.11)
RDW: 15.1 % (ref 11.5–15.5)
WBC: 5.6 10*3/uL (ref 4.0–10.5)
nRBC: 0 % (ref 0.0–0.2)

## 2021-07-02 SURGERY — RECONSTRUCTION, TENDON OR LIGAMENT, ELBOW
Anesthesia: General | Site: Elbow | Laterality: Right

## 2021-07-02 MED ORDER — PROPOFOL 10 MG/ML IV BOLUS
INTRAVENOUS | Status: DC | PRN
  Administered 2021-07-02: 200 mg via INTRAVENOUS

## 2021-07-02 MED ORDER — CEFAZOLIN IN SODIUM CHLORIDE 3-0.9 GM/100ML-% IV SOLN
3.0000 g | INTRAVENOUS | Status: AC
  Administered 2021-07-02: 3 g via INTRAVENOUS
  Filled 2021-07-02: qty 100

## 2021-07-02 MED ORDER — PHENYLEPHRINE HCL-NACL 20-0.9 MG/250ML-% IV SOLN
INTRAVENOUS | Status: DC | PRN
  Administered 2021-07-02: 25 ug/min via INTRAVENOUS

## 2021-07-02 MED ORDER — LIDOCAINE 2% (20 MG/ML) 5 ML SYRINGE
INTRAMUSCULAR | Status: DC | PRN
  Administered 2021-07-02: 100 mg via INTRAVENOUS

## 2021-07-02 MED ORDER — DEXAMETHASONE SODIUM PHOSPHATE 4 MG/ML IJ SOLN
INTRAMUSCULAR | Status: DC | PRN
  Administered 2021-07-02: 4 mg via PERINEURAL

## 2021-07-02 MED ORDER — MIDAZOLAM HCL 2 MG/2ML IJ SOLN
INTRAMUSCULAR | Status: AC
  Administered 2021-07-02: 1 mg via INTRAVENOUS
  Filled 2021-07-02: qty 2

## 2021-07-02 MED ORDER — ONDANSETRON HCL 4 MG/2ML IJ SOLN
INTRAMUSCULAR | Status: AC
  Filled 2021-07-02: qty 2

## 2021-07-02 MED ORDER — ACETAMINOPHEN 500 MG PO TABS
1000.0000 mg | ORAL_TABLET | Freq: Once | ORAL | Status: AC
  Administered 2021-07-02: 1000 mg via ORAL
  Filled 2021-07-02: qty 2

## 2021-07-02 MED ORDER — ORAL CARE MOUTH RINSE: 15.0000 mL | Freq: Once | OROMUCOSAL | Status: AC

## 2021-07-02 MED ORDER — DOCUSATE SODIUM 100 MG PO CAPS: 100.0000 mg | ORAL_CAPSULE | Freq: Two times a day (BID) | ORAL | 0 refills | Status: DC

## 2021-07-02 MED ORDER — FENTANYL CITRATE (PF) 250 MCG/5ML IJ SOLN
INTRAMUSCULAR | Status: AC
  Filled 2021-07-02: qty 5

## 2021-07-02 MED ORDER — OXYCODONE-ACETAMINOPHEN 10-325 MG PO TABS: 1.0000 | ORAL_TABLET | Freq: Four times a day (QID) | ORAL | 0 refills | Status: AC | PRN

## 2021-07-02 MED ORDER — ROPIVACAINE HCL 5 MG/ML IJ SOLN
INTRAMUSCULAR | Status: DC | PRN
  Administered 2021-07-02: 30 mL via PERINEURAL

## 2021-07-02 MED ORDER — CHLORHEXIDINE GLUCONATE 0.12 % MT SOLN
15.0000 mL | Freq: Once | OROMUCOSAL | Status: AC
  Administered 2021-07-02: 15 mL via OROMUCOSAL
  Filled 2021-07-02: qty 15

## 2021-07-02 MED ORDER — FENTANYL CITRATE (PF) 100 MCG/2ML IJ SOLN
INTRAMUSCULAR | Status: AC
  Administered 2021-07-02: 50 ug via INTRAVENOUS
  Filled 2021-07-02: qty 2

## 2021-07-02 MED ORDER — MIDAZOLAM HCL 2 MG/2ML IJ SOLN: 1.0000 mg | Freq: Once | INTRAMUSCULAR | Status: AC

## 2021-07-02 MED ORDER — ONDANSETRON HCL 4 MG/2ML IJ SOLN
INTRAMUSCULAR | Status: DC | PRN
  Administered 2021-07-02: 4 mg via INTRAVENOUS

## 2021-07-02 MED ORDER — FENTANYL CITRATE (PF) 100 MCG/2ML IJ SOLN: 25.0000 ug | INTRAMUSCULAR | Status: DC | PRN

## 2021-07-02 MED ORDER — CLONIDINE HCL (ANALGESIA) 100 MCG/ML EP SOLN
EPIDURAL | Status: DC | PRN
  Administered 2021-07-02: 80 ug

## 2021-07-02 MED ORDER — FENTANYL CITRATE (PF) 250 MCG/5ML IJ SOLN
INTRAMUSCULAR | Status: DC | PRN
  Administered 2021-07-02 (×2): 25 ug via INTRAVENOUS

## 2021-07-02 MED ORDER — DEXAMETHASONE SODIUM PHOSPHATE 10 MG/ML IJ SOLN
INTRAMUSCULAR | Status: DC | PRN
Start: 2021-07-02 — End: 2021-07-02
  Administered 2021-07-02: 4 mg via INTRAVENOUS

## 2021-07-02 MED ORDER — BUPIVACAINE HCL (PF) 0.25 % IJ SOLN
INTRAMUSCULAR | Status: AC
  Filled 2021-07-02: qty 30

## 2021-07-02 MED ORDER — EPHEDRINE SULFATE-NACL 50-0.9 MG/10ML-% IV SOSY
PREFILLED_SYRINGE | INTRAVENOUS | Status: DC | PRN
Start: 2021-07-02 — End: 2021-07-02
  Administered 2021-07-02 (×2): 5 mg via INTRAVENOUS
  Administered 2021-07-02: 10 mg via INTRAVENOUS

## 2021-07-02 MED ORDER — AMISULPRIDE (ANTIEMETIC) 5 MG/2ML IV SOLN: 10.0000 mg | Freq: Once | INTRAVENOUS | Status: DC | PRN

## 2021-07-02 MED ORDER — LIDOCAINE 2% (20 MG/ML) 5 ML SYRINGE
INTRAMUSCULAR | Status: AC
  Filled 2021-07-02: qty 5

## 2021-07-02 MED ORDER — 0.9 % SODIUM CHLORIDE (POUR BTL) OPTIME
TOPICAL | Status: DC | PRN
  Administered 2021-07-02: 1000 mL

## 2021-07-02 MED ORDER — PHENYLEPHRINE 40 MCG/ML (10ML) SYRINGE FOR IV PUSH (FOR BLOOD PRESSURE SUPPORT)
PREFILLED_SYRINGE | INTRAVENOUS | Status: DC | PRN
  Administered 2021-07-02 (×2): 40 ug via INTRAVENOUS
  Administered 2021-07-02: 80 ug via INTRAVENOUS
  Administered 2021-07-02: 40 ug via INTRAVENOUS

## 2021-07-02 MED ORDER — PROPOFOL 10 MG/ML IV BOLUS
INTRAVENOUS | Status: AC
  Filled 2021-07-02: qty 20

## 2021-07-02 MED ORDER — LACTATED RINGERS IV SOLN: INTRAVENOUS | Status: DC

## 2021-07-02 MED ORDER — FENTANYL CITRATE (PF) 100 MCG/2ML IJ SOLN: 50.0000 ug | Freq: Once | INTRAMUSCULAR | Status: AC

## 2021-07-02 SURGICAL SUPPLY — 52 items
ANCH SUT 1 SHRT SM RGD INSRTR (Anchor) ×2 IMPLANT
ANCHOR SUT 1.45 SZ 1 SHORT (Anchor) ×2 IMPLANT
BAG COUNTER SPONGE SURGICOUNT (BAG) ×2 IMPLANT
BAG SPNG CNTER NS LX DISP (BAG) ×1
BLADE SURG 15 STRL LF DISP TIS (BLADE) IMPLANT
BLADE SURG 15 STRL SS (BLADE) ×2
BNDG CMPR 9X4 STRL LF SNTH (GAUZE/BANDAGES/DRESSINGS) ×1
BNDG ELASTIC 3X5.8 VLCR STR LF (GAUZE/BANDAGES/DRESSINGS) ×1 IMPLANT
BNDG ELASTIC 4X5.8 VLCR STR LF (GAUZE/BANDAGES/DRESSINGS) ×1 IMPLANT
BNDG ESMARK 4X9 LF (GAUZE/BANDAGES/DRESSINGS) ×1 IMPLANT
BNDG GAUZE ELAST 4 BULKY (GAUZE/BANDAGES/DRESSINGS) IMPLANT
CANISTER SUCT 3000ML PPV (MISCELLANEOUS) ×2 IMPLANT
CLSR STERI-STRIP ANTIMIC 1/2X4 (GAUZE/BANDAGES/DRESSINGS) ×1 IMPLANT
CORD BIPOLAR FORCEPS 12FT (ELECTRODE) ×2 IMPLANT
COVER SURGICAL LIGHT HANDLE (MISCELLANEOUS) ×2 IMPLANT
CUFF TOURN SGL QUICK 24 (TOURNIQUET CUFF) ×2
CUFF TRNQT CYL 24X4X16.5-23 (TOURNIQUET CUFF) IMPLANT
DRAPE U-SHAPE 47X51 STRL (DRAPES) ×1 IMPLANT
DRSG ADAPTIC 3X8 NADH LF (GAUZE/BANDAGES/DRESSINGS) ×1 IMPLANT
GAUZE SPONGE 4X4 12PLY STRL (GAUZE/BANDAGES/DRESSINGS) ×1 IMPLANT
GLOVE SURG ORTHO LTX SZ8 (GLOVE) ×2 IMPLANT
GLOVE SURG UNDER POLY LF SZ8.5 (GLOVE) ×2 IMPLANT
GOWN STRL REUS W/ TWL LRG LVL3 (GOWN DISPOSABLE) ×2 IMPLANT
GOWN STRL REUS W/ TWL XL LVL3 (GOWN DISPOSABLE) ×1 IMPLANT
GOWN STRL REUS W/TWL LRG LVL3 (GOWN DISPOSABLE) ×4
GOWN STRL REUS W/TWL XL LVL3 (GOWN DISPOSABLE) ×2
KIT BASIN OR (CUSTOM PROCEDURE TRAY) ×2 IMPLANT
KIT TURNOVER KIT B (KITS) ×2 IMPLANT
NDL HYPO 25GX1X1/2 BEV (NEEDLE) IMPLANT
NEEDLE HYPO 25GX1X1/2 BEV (NEEDLE) ×2 IMPLANT
NS IRRIG 1000ML POUR BTL (IV SOLUTION) ×2 IMPLANT
PACK ORTHO EXTREMITY (CUSTOM PROCEDURE TRAY) ×2 IMPLANT
PAD ARMBOARD 7.5X6 YLW CONV (MISCELLANEOUS) ×4 IMPLANT
PAD CAST 4YDX4 CTTN HI CHSV (CAST SUPPLIES) IMPLANT
PADDING CAST COTTON 4X4 STRL (CAST SUPPLIES) ×2
SLEEVE SCD COMPRESS KNEE MED (STOCKING) ×1 IMPLANT
SLING ARM FOAM STRAP LRG (SOFTGOODS) ×1 IMPLANT
SOAP 2 % CHG 4 OZ (WOUND CARE) ×2 IMPLANT
SPECIMEN JAR SMALL (MISCELLANEOUS) ×1 IMPLANT
SPLINT FIBERGLASS 3X35 (CAST SUPPLIES) ×1 IMPLANT
SPONGE T-LAP 18X18 ~~LOC~~+RFID (SPONGE) ×1 IMPLANT
SUT ETHIBOND 4 0 TF (SUTURE) ×1 IMPLANT
SUT MNCRL AB 3-0 PS2 18 (SUTURE) ×2 IMPLANT
SUT PROLENE 4 0 PS 2 18 (SUTURE) ×1 IMPLANT
SUT VIC AB 2-0 CT1 27 (SUTURE)
SUT VIC AB 2-0 CT1 TAPERPNT 27 (SUTURE) IMPLANT
SYR CONTROL 10ML LL (SYRINGE) IMPLANT
TOWEL GREEN STERILE (TOWEL DISPOSABLE) ×2 IMPLANT
TOWEL GREEN STERILE FF (TOWEL DISPOSABLE) ×2 IMPLANT
TUBE CONNECTING 12X1/4 (SUCTIONS) IMPLANT
UNDERPAD 30X36 HEAVY ABSORB (UNDERPADS AND DIAPERS) ×2 IMPLANT
WATER STERILE IRR 1000ML POUR (IV SOLUTION) ×2 IMPLANT

## 2021-07-02 NOTE — Transfer of Care (Signed)
Immediate Anesthesia Transfer of Care Note  Patient: [name]  Procedure(s) Performed: Right wrist 6 dorsal compartment tenosynovectomy and tendon repair Right elbow lateral epicondylar debridement and tendon repair (Right: Elbow)  Patient Location: PACU  Anesthesia Type:General and Regional  Level of Consciousness: drowsy and patient cooperative  Airway & Oxygen Therapy: Patient Spontanous Breathing and Patient connected to face mask oxygen  Post-op Assessment: Report given to RN and Post -op Vital signs reviewed and stable  Post vital signs: Reviewed and stable  Last Vitals:  Vitals Value Taken Time  BP 131/62 07/02/21 1802  Temp    Pulse 67 07/02/21 1804  Resp 16 07/02/21 1804  SpO2 98 % 07/02/21 1804  Vitals shown include unvalidated device data.  Last Pain:  Vitals:   07/02/21 1247  TempSrc: Oral         Complications: No notable events documented.

## 2021-07-02 NOTE — Anesthesia Procedure Notes (Signed)
Anesthesia Regional Block: Supraclavicular block   Pre-Anesthetic Checklist: , timeout performed,  Correct Patient, Correct Site, Correct Laterality,  Correct Procedure, Correct Position, site marked,  Risks and benefits discussed,  Surgical consent,  Pre-op evaluation,  At surgeon's request and post-op pain management  Laterality: Upper and Right  Prep: chloraprep       Needles:  Injection technique: Single-shot  Needle Type: Stimiplex          Additional Needles:   Procedures:,,,, ultrasound used (permanent image in chart),,    Narrative:  Start time: 07/02/2021 3:30 PM End time: 07/02/2021 3:50 PM Injection made incrementally with aspirations every 5 mL.  Performed by: Personally  Anesthesiologist: Lewie Loron, MD  Additional Notes: BP cuff, SpO2 and EKG monitors applied. Sedation begun. Nerve location verified with ultrasound. Anesthetic injected incrementally, slowly, and after neg aspirations under direct u/s guidance. Good perineural spread. Tolerated well.

## 2021-07-02 NOTE — Anesthesia Procedure Notes (Addendum)
Procedure Name: LMA Insertion Date/Time: 07/02/2021 4:21 PM Performed by: Audie Pinto, CRNA Pre-anesthesia Checklist: Patient identified, Emergency Drugs available, Suction available and Patient being monitored Patient Re-evaluated:Patient Re-evaluated prior to induction Oxygen Delivery Method: Circle system utilized Preoxygenation: Pre-oxygenation with 100% oxygen Induction Type: IV induction LMA: LMA inserted LMA Size: 4.0 Placement Confirmation: positive ETCO2 Dental Injury: Teeth and Oropharynx as per pre-operative assessment

## 2021-07-02 NOTE — Discharge Instructions (Signed)
KEEP BANDAGE CLEAN AND DRY CALL OFFICE FOR F/U APPT [phone] IN 15 DAYS RX SENT TO [facility] [address] KEEP HAND ELEVATED ABOVE HEART OK TO APPLY ICE TO OPERATIVE AREA CONTACT OFFICE IF ANY WORSENING PAIN OR CONCERNS.

## 2021-07-02 NOTE — Anesthesia Preprocedure Evaluation (Addendum)
Anesthesia Evaluation  Patient identified by MRN, date of birth, ID band Patient awake    Reviewed: Allergy & Precautions, NPO status , Patient's Chart, lab work & pertinent test results  History of Anesthesia Complications (+) PONV and history of anesthetic complications  Airway Mallampati: II  TM Distance: >3 FB Neck ROM: Full    Dental  (+) Dental Advisory Given   Pulmonary shortness of breath, sleep apnea , Patient abstained from smoking., former smoker,    breath sounds clear to auscultation       Cardiovascular hypertension, Pt. on medications and Pt. on home beta blockers + CAD and + CABG   Rhythm:Regular Rate:Normal     Neuro/Psych  Neuromuscular disease    GI/Hepatic Neg liver ROS, GERD  ,  Endo/Other  diabetes, Type 2, Oral Hypoglycemic AgentsMorbid obesity  Renal/GU CRFRenal disease     Musculoskeletal  (+) Arthritis ,   Abdominal   Peds  Hematology negative hematology ROS (+)   Anesthesia Other Findings   Reproductive/Obstetrics                            Lab Results  Component Value Date   WBC 5.6 07/02/2021   HGB 12.1 07/02/2021   HCT 40.2 07/02/2021   MCV 77.2 (L) 07/02/2021   PLT 192 07/02/2021   Lab Results  Component Value Date   CREATININE 1.43 (H) 07/02/2021   BUN 25 (H) 07/02/2021   NA 139 07/02/2021   K 4.3 07/02/2021   CL 105 07/02/2021   CO2 23 07/02/2021    Anesthesia Physical Anesthesia Plan  ASA: 3  Anesthesia Plan: General   Post-op Pain Management:  Regional for Post-op pain   Induction: Intravenous  PONV Risk Score and Plan: Dexamethasone, Ondansetron, Midazolam and Treatment may vary due to age or medical condition  Airway Management Planned: LMA  Additional Equipment: None  Intra-op Plan:   Post-operative Plan: Extubation in OR  Informed Consent: I have reviewed the patients History and Physical, chart, labs and discussed the  procedure including the risks, benefits and alternatives for the proposed anesthesia with the patient or authorized representative who has indicated his/her understanding and acceptance.     Dental advisory given  Plan Discussed with: CRNA  Anesthesia Plan Comments:         Anesthesia Quick Evaluation

## 2021-07-02 NOTE — Op Note (Signed)
PREOPERATIVE DIAGNOSIS: Right wrist lateral epicondylar tendon tear Right wrist sixth dorsal compartment tenosynovitis and ECU tendon tear  POSTOPERATIVE DIAGNOSIS: Same  ATTENDING SURGEON: Dr. Bradly Bienenstock who scrubbed and present for the entire procedure  ASSISTANT SURGEON: Lambert Mody, PA-C was scrubbed necessary for exposure tendon repair closure splinting in a timely fashion  ANESTHESIA: Regional with general anesthetic via an LMA  OPERATIVE PROCEDURE: Right elbow lateral epicondylar tendon debridement and tendon repair Right wrist 6 dorsal compartment ECU tendon stabilization and repair Right wrist 6 dorsal compartment tenosynovectomy  IMPLANTS: 2 Biomet juggernaut anchors for the elbow  EBL: Minimal  RADIOGRAPHIC INTERPRETATION: None  SURGICAL INDICATIONS: Patient is a right-hand-dominant female with the right elbow and ulnar-sided wrist pain.  Patient elected undergo the above procedure.  The risks of surgery include but not limited to bleeding infection damage nearby nerves arteries or tendons loss of motion of the wrist and digits incomplete relief of symptoms and need for further surgical invention.  Signed informed consent was obtained on the day of surgery.  SURGICAL TECHNIQUE: Patient was brought identified in the preoperative holding area marked apart a marker made on the right elbow and right wrist indicate correct operative sites.  Patient brought back to operating placed supine on the anesthesia table where the regional anesthetic had been administered.  The general anesthetic was administered.  Preoperative antibiotics were given prior to skin incision.  A well-padded tourniquet placed on the right brachium and seal with the appropriate drape.  The right upper extremities then prepped and draped normal sterile fashion.  A timeout was called the correct site was identified procedure then begun.  Attention was the first turned to the elbow. Several centimeter incision  made directly over the anterior aspect of the lateral epicondyle.  Dissection carried down through the skin and subcutaneous tissue.  Fascia was incised and preserved exposing the ECRL.  This was again carefully elevated going perpendicular the ECRL to ECRB was then identified.  Once ECRB was identified the patient did have the degenerative tissue in the disruption of the ECRB attachment.  The ECRB was then carefully mobilized and the degenerative tissue was then excised.  This was done sharply with sharp scissors knife and a rondure.  The anterior portion of the lateral epicondylar region was then carefully exposed.  This was then released anteriorly along the lateral condylar line.  Following this the healthy tissue was then able to be mobilized.  2 Biomet suture anchors were then placed in the lateral epicondylar region.  The patient did not have any irregularities in the joint did not have a large joint effusion.  The suture anchors were seated nicely.  Following these the sutures were then delivered through the ECRB and this was repaired nicely down to bone.  This was reinforced with a max braid suture.  The ECRL interval was then closed with 2-0 Vicryl suture.  The fascial layer was then closed with 2-0 Vicryl suture.  The skin was then closed with layered fashion with 3-0 Monocryl and running 4-0 Prolene.  Steri-Strips were applied.  Attention was then turned to the right wrist.  A longitudinal incision made directly over the ECU 6 dorsal compartment.  Dissection carried down through the skin and subcutaneous tissue over the dorsal ulnar sensory branch was then carefully preserved.  Following this deep dissection carried down to the 6 dorsal compartment retinaculum which was released distally.  This exposed the subcu through the ECU.  The step sheath was of good tissue  quality.  This was then opened up and the 6 dorsal compartment tendon was carefully freed.  The patient did have a large longitudinal  splitting of the ECU.  This was then carefully identified and tenosynovectomy was then carried out of the 6 dorsal compartment.  The patient did have a moderate amount of fluid within the 6 dorsal compartment.  After tenosynovectomy the 6 dorsal compartment the ECU tendon was then carefully repaired with 4-0 Ethibond suture.  This was done in a running locking fashion.  No other abnormalities were noted within the 6 dorsal compartment.  The wound was then irrigated.  The subcu was then closed with 2-0 Vicryl suture.  The retinaculum was then closed with 2-0 Vicryl suture.  Subcutaneous tissues closed with Monocryl and the skin was then closed with running 4-0 Prolene.  Steri-Strips were applied.  Sterile compressive bandage then applied.  The patient then placed in a long-arm splint.  Patient tolerated the procedure well. Patient extubated taken recovery in good condition.  POSTOPERATIVE PLAN: Patient discharged to home.  See her back in the office in 2 weeks for wound check suture removal sent down to see the therapist for a long-arm splint begin a postoperative lateral epicondylar tendon repair protocol.  No radiographs the first visit.

## 2021-07-03 ENCOUNTER — Encounter (HOSPITAL_COMMUNITY): Payer: Self-pay | Admitting: Orthopedic Surgery

## 2021-07-03 NOTE — Anesthesia Postprocedure Evaluation (Signed)
Anesthesia Post Note  Patient: [name]  Procedure(s) Performed: Right wrist 6 dorsal compartment tenosynovectomy and tendon repair Right elbow lateral epicondylar debridement and tendon repair (Right: Elbow)     Patient location during evaluation: PACU Anesthesia Type: General Level of consciousness: sedated and patient cooperative Pain management: pain level controlled Vital Signs Assessment: post-procedure vital signs reviewed and stable Respiratory status: spontaneous breathing Cardiovascular status: stable Anesthetic complications: no   No notable events documented.  Last Vitals:  Vitals:   07/02/21 1815 07/02/21 1830  BP: 117/65 133/72  Pulse: 73 71  Resp: 15 18  Temp:  36.7 C  SpO2: 92% 93%    Last Pain:  Vitals:   07/02/21 1830  TempSrc:   PainSc: 0-No pain                 Lewie Loron

## 2021-07-07 ENCOUNTER — Other Ambulatory Visit: Payer: Self-pay | Admitting: Internal Medicine

## 2021-07-17 ENCOUNTER — Other Ambulatory Visit: Payer: Self-pay | Admitting: Internal Medicine

## 2021-07-20 ENCOUNTER — Encounter: Payer: Self-pay | Admitting: Internal Medicine

## 2021-07-21 ENCOUNTER — Ambulatory Visit: Payer: 59 | Admitting: Internal Medicine

## 2021-07-21 NOTE — Progress Notes (Deleted)
Name: [name]  Age/ Sex: 50 y.o., female   MRN/ DOB: [id number], 1971-02-09     PCP: [name], MD   Reason for Endocrinology Evaluation: Type 2 Diabetes Mellitus  Initial Endocrine Consultative Visit: 11/16/2019    PATIENT IDENTIFIER: [name] is a 50 y.o. female with a past medical history of CAD, T2DM and HTN. The patient has followed with Endocrinology clinic since 11/16/2019 for consultative assistance with management of her diabetes.  DIABETIC HISTORY:  [name] was diagnosed with DM in 2004. She is intolerant to higher doses of metformin, nor to Trulicity/ Victoza  due to diarrhea. She was able to tolerate Victoza which was started 05/2019. Her hemoglobin A1c has ranged from 7.2% in 2013, peaking at >15.0% in 2019.    On her initial visit to our clinic she had an A1c of 15.1% . She was on Levemir, Metformin  And victoza and we added [place].   Victoza was stopped 05/2020 due to "severe nausea"  She was started on the Omnipod 07/2020   Victoza stopped 05/2020 due to nausea and diarrhea  Metformin reduced by 50% due to GI issues     Switched Gabapentin to Lyrica 10/2020 SUBJECTIVE:   During the last visit (03/19/2021): A1c 9.7  %.We  continued  metformin and Jardiance and adjusted pump setting       Today (07/21/2021): [name] is here for a follow up on diabetes management.  She checks her blood sugars multiple times a day through the Dexcom    She is S/P wrist sx S/P fall and tendon tear 07/02/2021 Lyrica has worked wonder for neuropathy   Follows with nephrology       This patient with type 2 diabetes is treated with Omnipod (insulin pump). During the visit the pump basal and bolus doses were reviewed including carb/insulin rations and supplemental doses. The clinical list was updated. The glucose meter download was reviewed in detail to determine if the current pump settings are providing the best glycemic control without  excessive hypoglycemia.    Pump and meter download:    Pump   Omnipod Settings   Insulin type   NOVOLOG    Basal rate       0000 1.65 u/h               I:C ratio       0000 1:8                  Sensitivity       0000  25      Goal       0000  110-120            Type & Model of Pump: Omnipod Insulin Type: Currently using Novolog    PUMP STATISTICS: Average BG: 289 BG Readings: (1.6 / day) Average Daily Carbs (g):  83 Average Total Daily Insulin: 54 Average Daily Basal: 28.4 (61 %) Average Daily Bolus: 17.8 (39 %)      HOME DIABETES REGIMEN:  Metformin 500 mg XR , 2 tablet with breakfast  Jardiance 25 mg daily        Statin: Yes ACE-I/ARB: no     CONTINUOUS GLUCOSE MONITORING RECORD INTERPRETATION    Dates of Recording: 7/7 - 03/19/2021  Sensor description:Dexcom  Results statistics:   CGM use % of time 64  Average and SD 266/66  Time in range    10%  % Time Above 180 35  %  Time above 250 55  % Time Below target 0     Glycemic patterns summary: Hyperglycemia all day and night   Hyperglycemic episodes  All day and night   Hypoglycemic episodes occurred N/A  Overnight periods: high       DIABETIC COMPLICATIONS: Microvascular complications:  Neuropathy, retinopathy Denies: CKD Last Eye Exam: Completed   Macrovascular complications:  CAD ( S/P CABG 10/2019) Denies:  CVA, PVD   HISTORY:  Past Medical History:  Past Medical History:  Diagnosis Date   Anxiety    Arthritis    fingers   Cervical dysplasia    Coronary artery disease    Depression    Diabetes mellitus    Type II   Dyspnea 11/22/2013   GERD (gastroesophageal reflux disease)    High cholesterol    History of kidney stones    passed   Hypertension    no meds now   Kidney stones    Moderate episode of recurrent major depressive disorder ([place]) 12/28/2019   Neuropathy    feet and legs   Nuclear sclerotic cataract of both eyes 09/2019   Dr. Idolina Primer    PONV (postoperative nausea and vomiting)    one time   Sleep apnea    lost 70lbs no cpap x41yrs now   Stage 3a chronic kidney disease ([place]) 04/29/2020   Past Surgical History:  Past Surgical History:  Procedure Laterality Date   CARDIAC CATHETERIZATION     4-32yrs ago   CERVICAL CONIZATION W/BX N/A 05/04/2018   Procedure: CONIZATION CERVIX WITH BIOPSY - COLD KNIFE;  Surgeon: Chancy Milroy, MD;  Location: [facility];  Service: Gynecology;  Laterality: N/A;   CHOLECYSTECTOMY     CORONARY ARTERY BYPASS GRAFT N/A 10/24/2019   Procedure: CORONARY ARTERY BYPASS GRAFTING (CABG) times four, using left radial artery harvested endoscopically and right greater saphenous vein harvested endoscopically.;  Surgeon: Ivin Poot, MD;  Location: [facility];  Service: Open Heart Surgery;  Laterality: N/A;   ENDOMETRIAL ABLATION     6 years ago   INCISION AND DRAINAGE     for boil- buttocks   INCISION AND DRAINAGE Left    Leg, Nephrotoxin fasciotimy   IRRIGATION AND DEBRIDEMENT ABSCESS Right 03/17/2017   Procedure: IRRIGATION AND DEBRIDEMENT RIGHT THIGH ABSCESS;  Surgeon: Michael Boston, MD;  Location: [facility];  Service: General;  Laterality: Right;   LAPAROSCOPIC APPENDECTOMY  10/04/2011   Procedure: APPENDECTOMY LAPAROSCOPIC;  Surgeon: Judieth Keens, DO;  Location: [facility];  Service: General;  Laterality: N/A;   LEFT HEART CATH AND CORONARY ANGIOGRAPHY N/A 10/06/2019   Procedure: LEFT HEART CATH AND CORONARY ANGIOGRAPHY;  Surgeon: Martinique, Peter M, MD;  Location: [facility];  Service: Cardiovascular;  Laterality: N/A;   RADIAL ARTERY HARVEST Left 10/24/2019   Procedure: Radial Artery Harvest;  Surgeon: Ivin Poot, MD;  Location: [facility];  Service: Open Heart Surgery;  Laterality: Left;   TEE WITHOUT CARDIOVERSION N/A 10/24/2019   Procedure: TRANSESOPHAGEAL ECHOCARDIOGRAM (TEE);  Surgeon: Prescott Gum, Collier Salina, MD;  Location: [facility];  Service: Open Heart Surgery;  Laterality: N/A;    TENDON RECONSTRUCTION Right 07/02/2021   Procedure: Right wrist 6 dorsal compartment tenosynovectomy and tendon repair Right elbow lateral epicondylar debridement and tendon repair;  Surgeon: Iran Planas, MD;  Location: [facility];  Service: Orthopedics;  Laterality: Right;   Social History:  reports that she quit smoking about 4 years ago. Her smoking use included e-cigarettes and cigarettes. She  has a 48.00 pack-year smoking history. She has never used smokeless tobacco. She reports current alcohol use. She reports that she does not use drugs. Family History:  Family History  Problem Relation Age of Onset   Aneurysm Mother    Pancreatic cancer Mother    Heart attack Father    Stroke Father    Heart disease Sister    Breast cancer Paternal Grandmother    Congestive Heart Failure Sister    Osteoarthritis Sister    Osteoarthritis Sister      HOME MEDICATIONS: Allergies as of 07/21/2021       Reactions   Sulfa Antibiotics Hives   Codeine Itching        Medication List        Accurate as of July 21, 2021  7:38 AM. If you have any questions, ask your nurse or doctor.          acetaminophen 500 MG tablet Commonly known as: TYLENOL Take 1,000 mg by mouth every 6 (six) hours as needed for moderate pain or headache.   albuterol 108 (90 Base) MCG/ACT inhaler Commonly known as: VENTOLIN HFA Inhale 1-2 puffs into the lungs every 6 (six) hours as needed for wheezing or shortness of breath.   aspirin 81 MG EC tablet Take 1 tablet (81 mg total) by mouth daily.   atorvastatin 80 MG tablet Commonly known as: LIPITOR TAKE 1 TABLET(80 MG) BY MOUTH DAILY AT 6 PM What changed:  how much to take how to take this when to take this additional instructions   Dexcom G6 Receiver Devi Use as instructed to check blood sugar daily   Dexcom G6 Sensor Misc USE 1 DEVICE AS DIRECTED   Dexcom G6 Transmitter Misc USE AS DIRECTED   docusate sodium 100 MG capsule Commonly known as:  Colace Take 1 capsule (100 mg total) by mouth 2 (two) times daily.   empagliflozin 25 MG Tabs tablet Commonly known as: Jardiance Take 1 tablet (25 mg total) by mouth daily before breakfast.   esomeprazole 20 MG capsule Commonly known as: NEXIUM Take 20 mg by mouth daily.   ezetimibe 10 MG tablet Commonly known as: ZETIA Take 1 tablet (10 mg total) by mouth every evening.   FLUoxetine 40 MG capsule Commonly known as: PROZAC TAKE 1 CAPSULE(40 MG) BY MOUTH DAILY What changed:  how much to take how to take this when to take this   fluticasone 50 MCG/ACT nasal spray Commonly known as: FLONASE Place 2 sprays into both nostrils daily as needed for allergies or rhinitis.   furosemide 20 MG tablet Commonly known as: LASIX Take 1 tablet (20 mg total) by mouth daily as needed (weight increase of 3 lbs overnight or 5 lbs in 1 week). What changed: when to take this   glucose blood test strip Commonly known as: True Metrix Blood Glucose Test Use as instructed   insulin aspart 100 UNIT/ML injection Commonly known as: NovoLOG Max daily 60 units per pump   Insulin Syringes (Disposable) U-100 1 ML Misc 1 Device by Does not apply route as directed.   Melatonin 10 MG Caps Take 10 mg by mouth at bedtime as needed (sleep).   metFORMIN 500 MG 24 hr tablet Commonly known as: GLUCOPHAGE-XR TAKE 2 TABLETS(1000 MG) BY MOUTH DAILY WITH BREAKFAST   metoprolol succinate 25 MG 24 hr tablet Commonly known as: TOPROL-XL TAKE 1/2 TABLET BY MOUTH EVERY DAY   nitroGLYCERIN 0.4 MG SL tablet Commonly known as: NITROSTAT Place 0.4 mg  under the tongue every 5 (five) minutes as needed for chest pain.   OmniPod 10 Pack Misc 1 Device by Does not apply route as directed.   pregabalin 300 MG capsule Commonly known as: LYRICA Take 1 capsule (300 mg total) by mouth 2 (two) times daily.   TRUEplus Safety Lancets 28G Misc Use as directed         OBJECTIVE:   Vital Signs: LMP  (LMP Unknown)      Wt Readings from Last 3 Encounters:  07/02/21 (!) 305 lb (138.3 kg)  06/03/21 (!) 304 lb 6.4 oz (138.1 kg)  05/28/21 (!) 304 lb (137.9 kg)     Exam: General: Pt appears well and is in NAD  Neck: General: Supple without adenopathy. Thyroid: Thyroid size normal.  No goiter or nodules appreciated.  Lungs: Clear with good BS bilat with no rales, rhonchi, or wheezes  Heart: RRR with normal S1 and S2 and no gallops; no murmurs; no rub  Abdomen: Normoactive bowel sounds, soft, nontender, without masses or organomegaly palpable  Extremities: 1+  pretibial edema on the left and trace on the right   Neuro: MS is good with appropriate affect, pt is alert and Ox3      DM foot exam: 03/19/21   The skin of the feet is intact without sores or ulcerations. The pedal pulses are undetectable on today's exam  The sensation is normal to a screening 5.07, 10 gram monofilament bilaterally   DATA REVIEWED:  Lab Results  Component Value Date   HGBA1C 9.7 (A) 03/19/2021   HGBA1C 9.9 (A) 11/11/2020   HGBA1C 10.9 (A) 08/12/2020   Lab Results  Component Value Date   MICROALBUR <0.7 08/12/2020   LDLCALC 60 08/27/2020   CREATININE 1.43 (H) 07/02/2021   Lab Results  Component Value Date   MICRALBCREAT 2.7 08/12/2020     Lab Results  Component Value Date   CHOL 152 08/27/2020   HDL 76 08/27/2020   LDLCALC 60 08/27/2020   TRIG 83 08/27/2020   CHOLHDL 2.0 08/27/2020       Results for LAWSON, MANJARREZ (MRN [id number]) as of 03/19/2021 10:01  Ref. Range 12/27/2020 13:33  Sodium Latest Ref Range: 135 - 145 mmol/L 138  Potassium Latest Ref Range: 3.5 - 5.1 mmol/L 4.0  Chloride Latest Ref Range: 98 - 111 mmol/L 103  CO2 Latest Ref Range: 22 - 32 mmol/L 25  Glucose Latest Ref Range: 70 - 99 mg/dL 132 (H)  BUN Latest Ref Range: 6 - 20 mg/dL 46 (H)  Creatinine Latest Ref Range: 0.44 - 1.00 mg/dL 1.38 (H)  Calcium Latest Ref Range: 8.9 - 10.3 mg/dL 9.3  Anion gap Latest Ref Range: 5 - 15  10   Alkaline Phosphatase Latest Ref Range: 38 - 126 U/L 88  Albumin Latest Ref Range: 3.5 - 5.0 g/dL 3.8  AST Latest Ref Range: 15 - 41 U/L 17  ALT Latest Ref Range: 0 - 44 U/L 18  Total Protein Latest Ref Range: 6.5 - 8.1 g/dL 6.4 (L)  Total Bilirubin Latest Ref Range: 0.3 - 1.2 mg/dL 0.4  GFR, Estimated Latest Ref Range: >60 mL/min 46 (L)      ASSESSMENT / PLAN / RECOMMENDATIONS:   1) Type 2 Diabetes Mellitus, Poorly controlled, With Neuropathic, retinopathic and macrovascular complications - Most recent A1c of 9.7 %. Goal A1c < 7.0 %.    - Her A1c continues to trend down , but there's room for improvement.  - She has been  doing better with boluses, will make the following adjustments - I will make adjustments to her basal rate and I: C ratio as below      MEDICATIONS: - Continue Metformin 500 mg XR , 2 tablet with breakfast  - Continue Jardiance 25 mg , 1 tablet daily with Breakfast     Pump   Omnipod Settings   Insulin type   NOVOLOG    Basal rate       0000 2.0 u/h               I:C ratio       0000 1:6                  Sensitivity       0000  25      Goal       0000  110-120    EDUCATION / INSTRUCTIONS: BG monitoring instructions: Patient is instructed to check her blood sugars 4 times a day, preprandial and bedtime Call [facility] if: BG persistently < 70  I reviewed the Rule of 15 for the treatment of hypoglycemia in detail with the patient. Literature supplied.     2) Diabetic complications:  Eye: Does  have known diabetic retinopathy.  Neuro/ Feet: Does have known diabetic peripheral neuropathy .  Renal: Patient does not have known baseline CKD. She   is not on an ACEI/ARB at present.   3) Peripheral neuropathy   -I have switched her gabapentin to Lyrica in March 2022 and this has been working   Medication Continue Lyrica 300 twice daily   F/U in 4 months    Signed electronically by: Mack Guise,  MD  [facility]  [facility] [address] [place], [place] [address] Phone: [phone] FAX: [phone]   CC: Glenis Smoker, MD [place] [address] Phone: [phone]  Fax: [phone]  Return to Endocrinology clinic as below: Future Appointments  Date Time Provider [place]  07/21/2021  9:30 AM Eshal Propps, Melanie Crazier, MD [facility] None  07/28/2021  1:00 PM Troy Sine, MD [facility]  12/02/2021  1:00 PM Bo Merino, MD [facility] None

## 2021-07-28 ENCOUNTER — Other Ambulatory Visit: Payer: Self-pay

## 2021-07-28 ENCOUNTER — Ambulatory Visit (HOSPITAL_BASED_OUTPATIENT_CLINIC_OR_DEPARTMENT_OTHER): Payer: 59 | Attending: Cardiology | Admitting: Cardiovascular Disease

## 2021-07-28 DIAGNOSIS — G4733 Obstructive sleep apnea (adult) (pediatric): Secondary | ICD-10-CM

## 2021-07-28 DIAGNOSIS — I10 Essential (primary) hypertension: Secondary | ICD-10-CM | POA: Insufficient documentation

## 2021-07-28 DIAGNOSIS — G4719 Other hypersomnia: Secondary | ICD-10-CM

## 2021-07-30 ENCOUNTER — Ambulatory Visit (INDEPENDENT_AMBULATORY_CARE_PROVIDER_SITE_OTHER): Payer: 59 | Admitting: Internal Medicine

## 2021-07-30 ENCOUNTER — Encounter: Payer: Self-pay | Admitting: Internal Medicine

## 2021-07-30 ENCOUNTER — Other Ambulatory Visit: Payer: Self-pay

## 2021-07-30 VITALS — BP 114/76 | HR 73 | Ht 67.5 in | Wt 309.0 lb

## 2021-07-30 DIAGNOSIS — E1165 Type 2 diabetes mellitus with hyperglycemia: Secondary | ICD-10-CM

## 2021-07-30 DIAGNOSIS — E785 Hyperlipidemia, unspecified: Secondary | ICD-10-CM

## 2021-07-30 DIAGNOSIS — G63 Polyneuropathy in diseases classified elsewhere: Secondary | ICD-10-CM

## 2021-07-30 DIAGNOSIS — E11319 Type 2 diabetes mellitus with unspecified diabetic retinopathy without macular edema: Secondary | ICD-10-CM | POA: Diagnosis not present

## 2021-07-30 DIAGNOSIS — E1159 Type 2 diabetes mellitus with other circulatory complications: Secondary | ICD-10-CM | POA: Diagnosis not present

## 2021-07-30 DIAGNOSIS — Z794 Long term (current) use of insulin: Secondary | ICD-10-CM

## 2021-07-30 DIAGNOSIS — E1142 Type 2 diabetes mellitus with diabetic polyneuropathy: Secondary | ICD-10-CM | POA: Diagnosis not present

## 2021-07-30 LAB — LIPID PANEL
Cholesterol: 147 mg/dL (ref 0–200)
HDL: 68.7 mg/dL (ref >39.00)
LDL Cholesterol: 53 mg/dL (ref 0–99)
NonHDL: 78.53
Total CHOL/HDL Ratio: 2
Triglycerides: 128 mg/dL (ref 0.0–149.0)
VLDL: 25.6 mg/dL (ref 0.0–40.0)

## 2021-07-30 LAB — POCT GLYCOSYLATED HEMOGLOBIN (HGB A1C): Hemoglobin A1C: 9.8 % — AB (ref 4.0–5.6)

## 2021-07-30 LAB — MICROALBUMIN / CREATININE URINE RATIO
Creatinine,U: 31.9 mg/dL
Microalb Creat Ratio: 2.2 mg/g (ref 0.0–30.0)
Microalb, Ur: <0.7 mg/dL (ref 0.0–1.9)

## 2021-07-30 LAB — BASIC METABOLIC PANEL
BUN: 23 mg/dL (ref 6–23)
CO2: 30 mEq/L (ref 19–32)
Calcium: 9.3 mg/dL (ref 8.4–10.5)
Chloride: 103 mEq/L (ref 96–112)
Creatinine, Ser: 1.42 mg/dL — ABNORMAL HIGH (ref 0.40–1.20)
GFR: 42.55 mL/min — ABNORMAL LOW (ref >60.00)
Glucose, Bld: 127 mg/dL — ABNORMAL HIGH (ref 70–99)
Potassium: 4.4 mEq/L (ref 3.5–5.1)
Sodium: 141 mEq/L (ref 135–145)

## 2021-07-30 NOTE — Patient Instructions (Signed)
-   Continue Metformin 500 mg XR , 2 tablet with breakfast  - Continue  Jardiance 25 mg , 1 tablet daily with Breakfast  - Continue Lyrica 300 mg Twice daily    Pump   Omnipod Settings   Insulin type   NOVOLOG    Basal rate       0000 2.4 u/h               I:C ratio       0000 1:6                  Sensitivity       0000  20      Goal       0000  110-120     HOW TO TREAT LOW BLOOD SUGARS (Blood sugar LESS THAN 70 MG/DL) Please follow the RULE OF 15 for the treatment of hypoglycemia treatment (when your (blood sugars are less than 70 mg/dL)   STEP 1: Take 15 grams of carbohydrates when your blood sugar is low, which includes:  3-4 GLUCOSE TABS  OR 3-4 OZ OF JUICE OR REGULAR SODA OR ONE TUBE OF GLUCOSE GEL    STEP 2: RECHECK blood sugar in 15 MINUTES STEP 3: If your blood sugar is still low at the 15 minute recheck --> then, go back to STEP 1 and treat AGAIN with another 15 grams of carbohydrates.

## 2021-07-30 NOTE — Progress Notes (Signed)
Name: [name]  Age/ Sex: 50 y.o., female   MRN/ DOB: [id number], [birth year]     PCP: Glenis Smoker, MD   Reason for Endocrinology Evaluation: Type 2 Diabetes Mellitus  Initial Endocrine Consultative Visit: 11/16/2019    PATIENT IDENTIFIER: [name] is a 50 y.o. female with a past medical history of CAD, T2DM and HTN. The patient has followed with Endocrinology clinic since 11/16/2019 for consultative assistance with management of her diabetes.  DIABETIC HISTORY:  [name] was diagnosed with DM in 2004. She is intolerant to higher doses of metformin, nor to Trulicity/ Victoza  due to diarrhea. She was able to tolerate Victoza which was started 05/2019. Her hemoglobin A1c has ranged from 7.2% in 2013, peaking at >15.0% in 2019.    On her initial visit to our clinic she had an A1c of 15.1% . She was on Levemir, Metformin  And victoza and we added [place].   Victoza was stopped 05/2020 due to "severe nausea"  She was started on the Omnipod 07/2020   Victoza stopped 05/2020 due to nausea and diarrhea  Metformin reduced by 50% due to GI issues     Switched Gabapentin to Lyrica 10/2020 SUBJECTIVE:   During the last visit (03/19/2021): A1c 9.7  %.We  continued  metformin and Jardiance and adjusted pump setting       Today (07/30/2021): [name] is here for a follow up on diabetes management.  She checks her blood sugars multiple times a day through the Dexcom    She is S/P wrist sx S/P fall and tendon tear 07/02/2021 Lyrica has worked wonder for neuropathy   Follows with nephrology       This patient with type 2 diabetes is treated with Omnipod (insulin pump). During the visit the pump basal and bolus doses were reviewed including carb/insulin rations and supplemental doses. The clinical list was updated. The glucose meter download was reviewed in detail to determine if the current pump settings are providing the best glycemic control without  excessive hypoglycemia.    Pump and meter download:    Pump   Omnipod Settings   Insulin type   NOVOLOG    Basal rate       0000 2 u/h               I:C ratio       0000 1:6                  Sensitivity       0000  25      Goal       0000  110            Type & Model of Pump: Omnipod Insulin Type: Currently using Novolog    PUMP STATISTICS: Average BG: 295 BG Readings: 1.5 / day Average Daily Carbs (g):  52.9 Average Total Daily Insulin: 54 Average Daily Basal: 39.3 (70 %) Average Daily Bolus: 16.8 (30 %)      HOME DIABETES REGIMEN:  Metformin 500 mg XR , 2 tablet with breakfast  Jardiance 25 mg daily        Statin: Yes ACE-I/ARB: no     CONTINUOUS GLUCOSE MONITORING RECORD INTERPRETATION    Dates of Recording: 11/17-11/30/2022  Sensor description:Dexcom  Results statistics:   CGM use % of time 71  Average and SD 250/78  Time in range    21%  % Time Above 180 35  % Time above  250 44  % Time Below target 0     Glycemic patterns summary: Hyperglycemia all day and night   Hyperglycemic episodes  All day and night   Hypoglycemic episodes occurred N/A  Overnight periods: high       DIABETIC COMPLICATIONS: Microvascular complications:  Neuropathy, retinopathy Denies: CKD Last Eye Exam: Completed   Macrovascular complications:  CAD ( S/P CABG 10/2019) Denies:  CVA, PVD   HISTORY:  Past Medical History:  Past Medical History:  Diagnosis Date   Anxiety    Arthritis    fingers   Cervical dysplasia    Coronary artery disease    Depression    Diabetes mellitus    Type II   Dyspnea 11/22/2013   GERD (gastroesophageal reflux disease)    High cholesterol    History of kidney stones    passed   Hypertension    no meds now   Kidney stones    Moderate episode of recurrent major depressive disorder ([place]) 12/28/2019   Neuropathy    feet and legs   Nuclear sclerotic cataract of both eyes 09/2019   Dr. Idolina Primer    PONV (postoperative nausea and vomiting)    one time   Sleep apnea    lost 70lbs no cpap x42yrs now   Stage 3a chronic kidney disease ([place]) 04/29/2020   Past Surgical History:  Past Surgical History:  Procedure Laterality Date   CARDIAC CATHETERIZATION     4-95yrs ago   CERVICAL CONIZATION W/BX N/A 05/04/2018   Procedure: CONIZATION CERVIX WITH BIOPSY - COLD KNIFE;  Surgeon: Chancy Milroy, MD;  Location: [facility];  Service: Gynecology;  Laterality: N/A;   CHOLECYSTECTOMY     CORONARY ARTERY BYPASS GRAFT N/A 10/24/2019   Procedure: CORONARY ARTERY BYPASS GRAFTING (CABG) times four, using left radial artery harvested endoscopically and right greater saphenous vein harvested endoscopically.;  Surgeon: Ivin Poot, MD;  Location: [facility];  Service: Open Heart Surgery;  Laterality: N/A;   ENDOMETRIAL ABLATION     6 years ago   INCISION AND DRAINAGE     for boil- buttocks   INCISION AND DRAINAGE Left    Leg, Nephrotoxin fasciotimy   IRRIGATION AND DEBRIDEMENT ABSCESS Right 03/17/2017   Procedure: IRRIGATION AND DEBRIDEMENT RIGHT THIGH ABSCESS;  Surgeon: Michael Boston, MD;  Location: [facility];  Service: General;  Laterality: Right;   LAPAROSCOPIC APPENDECTOMY  10/04/2011   Procedure: APPENDECTOMY LAPAROSCOPIC;  Surgeon: Judieth Keens, DO;  Location: [facility];  Service: General;  Laterality: N/A;   LEFT HEART CATH AND CORONARY ANGIOGRAPHY N/A 10/06/2019   Procedure: LEFT HEART CATH AND CORONARY ANGIOGRAPHY;  Surgeon: Martinique, Peter M, MD;  Location: [facility];  Service: Cardiovascular;  Laterality: N/A;   RADIAL ARTERY HARVEST Left 10/24/2019   Procedure: Radial Artery Harvest;  Surgeon: Ivin Poot, MD;  Location: [facility];  Service: Open Heart Surgery;  Laterality: Left;   TEE WITHOUT CARDIOVERSION N/A 10/24/2019   Procedure: TRANSESOPHAGEAL ECHOCARDIOGRAM (TEE);  Surgeon: Prescott Gum, Collier Salina, MD;  Location: [facility];  Service: Open Heart Surgery;  Laterality: N/A;   TENDON  RECONSTRUCTION Right 07/02/2021   Procedure: Right wrist 6 dorsal compartment tenosynovectomy and tendon repair Right elbow lateral epicondylar debridement and tendon repair;  Surgeon: Iran Planas, MD;  Location: [facility];  Service: Orthopedics;  Laterality: Right;   Social History:  reports that she quit smoking about 4 years ago. Her smoking use included e-cigarettes and cigarettes. She has a  48.00 pack-year smoking history. She has never used smokeless tobacco. She reports current alcohol use. She reports that she does not use drugs. Family History:  Family History  Problem Relation Age of Onset   Aneurysm Mother    Pancreatic cancer Mother    Heart attack Father    Stroke Father    Heart disease Sister    Breast cancer Paternal Grandmother    Congestive Heart Failure Sister    Osteoarthritis Sister    Osteoarthritis Sister      HOME MEDICATIONS: Allergies as of 07/30/2021       Reactions   Sulfa Antibiotics Hives   Codeine Itching        Medication List        Accurate as of July 30, 2021  1:12 PM. If you have any questions, ask your nurse or doctor.          STOP taking these medications    glucose blood test strip Commonly known as: True Metrix Blood Glucose Test Stopped by: Scarlette Shorts, MD       TAKE these medications    acetaminophen 500 MG tablet Commonly known as: TYLENOL Take 1,000 mg by mouth every 6 (six) hours as needed for moderate pain or headache.   albuterol 108 (90 Base) MCG/ACT inhaler Commonly known as: VENTOLIN HFA Inhale 1-2 puffs into the lungs every 6 (six) hours as needed for wheezing or shortness of breath.   aspirin 81 MG EC tablet Take 1 tablet (81 mg total) by mouth daily.   atorvastatin 80 MG tablet Commonly known as: LIPITOR TAKE 1 TABLET(80 MG) BY MOUTH DAILY AT 6 PM What changed:  how much to take how to take this when to take this additional instructions   Dexcom G6 Receiver Devi Use as instructed to  check blood sugar daily   Dexcom G6 Sensor Misc USE 1 DEVICE AS DIRECTED   Dexcom G6 Transmitter Misc USE AS DIRECTED   docusate sodium 100 MG capsule Commonly known as: Colace Take 1 capsule (100 mg total) by mouth 2 (two) times daily.   empagliflozin 25 MG Tabs tablet Commonly known as: Jardiance Take 1 tablet (25 mg total) by mouth daily before breakfast.   esomeprazole 20 MG capsule Commonly known as: NEXIUM Take 20 mg by mouth daily.   ezetimibe 10 MG tablet Commonly known as: ZETIA Take 1 tablet (10 mg total) by mouth every evening.   FLUoxetine 40 MG capsule Commonly known as: PROZAC TAKE 1 CAPSULE(40 MG) BY MOUTH DAILY What changed:  how much to take how to take this when to take this   fluticasone 50 MCG/ACT nasal spray Commonly known as: FLONASE Place 2 sprays into both nostrils daily as needed for allergies or rhinitis.   furosemide 20 MG tablet Commonly known as: LASIX Take 1 tablet (20 mg total) by mouth daily as needed (weight increase of 3 lbs overnight or 5 lbs in 1 week). What changed: when to take this   insulin aspart 100 UNIT/ML injection Commonly known as: NovoLOG Max daily 60 units per pump   Insulin Syringes (Disposable) U-100 1 ML Misc 1 Device by Does not apply route as directed.   Melatonin 10 MG Caps Take 10 mg by mouth at bedtime as needed (sleep).   metFORMIN 500 MG 24 hr tablet Commonly known as: GLUCOPHAGE-XR TAKE 2 TABLETS(1000 MG) BY MOUTH DAILY WITH BREAKFAST   metoprolol succinate 25 MG 24 hr tablet Commonly known as: TOPROL-XL TAKE 1/2 TABLET  BY MOUTH EVERY DAY   nitroGLYCERIN 0.4 MG SL tablet Commonly known as: NITROSTAT Place 0.4 mg under the tongue every 5 (five) minutes as needed for chest pain.   OmniPod 10 Pack Misc 1 Device by Does not apply route as directed.   pregabalin 300 MG capsule Commonly known as: LYRICA Take 1 capsule (300 mg total) by mouth 2 (two) times daily.   TRUEplus Safety Lancets 28G  Misc Use as directed         OBJECTIVE:   Vital Signs: BP 114/76 (BP Location: Left Arm, Patient Position: Sitting, Cuff Size: Large)   Pulse 73   Ht 5' 7.5" (1.715 m)   Wt (!) 309 lb (140.2 kg)   LMP  (LMP Unknown)   SpO2 97%   BMI 47.68 kg/m     Wt Readings from Last 3 Encounters:  07/30/21 (!) 309 lb (140.2 kg)  07/28/21 300 lb (136.1 kg)  07/02/21 (!) 305 lb (138.3 kg)     Exam: General: Pt appears well and is in NAD  Neck: General: Supple without adenopathy. Thyroid: Thyroid size normal.  No goiter or nodules appreciated.  Lungs: Clear with good BS bilat with no rales, rhonchi, or wheezes  Heart: RRR with normal S1 and S2 and no gallops; no murmurs; no rub  Abdomen: Normoactive bowel sounds, soft, nontender, without masses or organomegaly palpable  Extremities: 1+  pretibial edema on the left and trace on the right   Neuro: MS is good with appropriate affect, pt is alert and Ox3      DM foot exam: 03/19/21   The skin of the feet is intact without sores or ulcerations. The pedal pulses are undetectable on today's exam  The sensation is normal to a screening 5.07, 10 gram monofilament bilaterally   DATA REVIEWED:  Lab Results  Component Value Date   HGBA1C 9.8 (A) 07/30/2021   HGBA1C 9.7 (A) 03/19/2021   HGBA1C 9.9 (A) 11/11/2020   Lab Results  Component Value Date   MICROALBUR <0.7 08/12/2020   LDLCALC 60 08/27/2020   CREATININE 1.43 (H) 07/02/2021   Lab Results  Component Value Date   MICRALBCREAT 2.7 08/12/2020     Lab Results  Component Value Date   CHOL 152 08/27/2020   HDL 76 08/27/2020   LDLCALC 60 08/27/2020   TRIG 83 08/27/2020   CHOLHDL 2.0 08/27/2020        Latest Reference Range & Units 07/30/21 12:13  Sodium 135 - 145 mEq/L 141  Potassium 3.5 - 5.1 mEq/L 4.4  Chloride 96 - 112 mEq/L 103  CO2 19 - 32 mEq/L 30  Glucose 70 - 99 mg/dL 127 (H)  BUN 6 - 23 mg/dL 23  Creatinine 0.40 - 1.20 mg/dL 1.42 (H)  Calcium 8.4 - 10.5  mg/dL 9.3  GFR >60.00 mL/min 42.55 (L)  Total CHOL/HDL Ratio  2  Cholesterol 0 - 200 mg/dL 147  HDL Cholesterol >39.00 mg/dL 68.70  LDL (calc) 0 - 99 mg/dL 53  MICROALB/CREAT RATIO 0.0 - 30.0 mg/g 2.2  NonHDL  78.53  Triglycerides 0.0 - 149.0 mg/dL 128.0  VLDL 0.0 - 40.0 mg/dL 25.6     ASSESSMENT / PLAN / RECOMMENDATIONS:   1) Type 2 Diabetes Mellitus, Poorly controlled, With Neuropathic, retinopathic and macrovascular complications - Most recent A1c of 9.8 %. Goal A1c < 7.0 %.    - Pt continues with hyperglycemia , this is due to dietary indiscretions and also due to lack of bolusing. She enters  carbohydrates sporadically. I have offered to give her a set dose of CHO to enter for meals and snacks if she doesn't feel like continue carbohydrates but she would like to try it again and see how she does  - We again emphasized the importance of proper use of technology  to be successful as my expectations to have better control with CGM and pump use  - Insurance will change and will switch to [place] by early next year  - I will adjust her basal and Sensitivity factor as below   MEDICATIONS: - Continue Metformin 500 mg XR , 2 tablet with breakfast  - Continue Jardiance 25 mg , 1 tablet daily with Breakfast     Pump   Omnipod Settings   Insulin type   NOVOLOG    Basal rate       0000 2.4 u/h               I:C ratio       0000 1:6                  Sensitivity       0000  20      Goal       0000  110-120    EDUCATION / INSTRUCTIONS: BG monitoring instructions: Patient is instructed to check her blood sugars 4 times a day, preprandial and bedtime Call [facility] if: BG persistently < 70  I reviewed the Rule of 15 for the treatment of hypoglycemia in detail with the patient. Literature supplied.     2) Diabetic complications:  Eye: Does  have known diabetic retinopathy.  Neuro/ Feet: Does have known diabetic peripheral neuropathy .  Renal: Patient  does not have known baseline CKD. She   is not on an ACEI/ARB at present.   3) Peripheral neuropathy   -I have switched her gabapentin to Lyrica in March 2022 and this has been working   Medication Continue Lyrica 300 twice daily  4) Dyslipidemia :   - Lipid panel at goal  Continue Atorvastatin 80 mg daily   5) Chronic Kidney Disease III:   - GFR trended down from 51 to 42  - Will start the pt on ARB to renal protection, encouraged optimizing glucose    Medication  Start Losartan 50 mg daily   F/U in 4 months    Signed electronically by: Mack Guise, MD  [facility]  [address], [place] [address] Phone: [phone] FAX: [phone]   CC: Glenis Smoker, MD [place] [address] Phone: [phone]  Fax: [phone]  Return to Endocrinology clinic as below: Future Appointments  Date Time Provider [place]  11/27/2021  7:50 AM Kamia Insalaco, Melanie Crazier, MD [facility] None  12/02/2021  1:00 PM Bo Merino, MD [facility] None

## 2021-08-01 MED ORDER — LOSARTAN POTASSIUM 50 MG PO TABS: 50.0000 mg | ORAL_TABLET | Freq: Every day | ORAL | 2 refills | Status: DC

## 2021-08-01 MED ORDER — ATORVASTATIN CALCIUM 80 MG PO TABS: 80.0000 mg | ORAL_TABLET | Freq: Every day | ORAL | 3 refills | Status: DC

## 2021-08-15 ENCOUNTER — Encounter (HOSPITAL_BASED_OUTPATIENT_CLINIC_OR_DEPARTMENT_OTHER): Payer: Self-pay | Admitting: Cardiovascular Disease

## 2021-08-15 NOTE — Procedures (Signed)
° ° ° °  Patient Name: [name], [name] Date: 07/28/2021 Gender: Female D.O.B: Dec 23, 1970 Age (years): 50 Referring Provider: Epifanio Lesches Height (inches): 68 Interpreting Physician: Nicki Guadalajara MD, ABSM Weight (lbs): 304 RPSGT: [place] BMI: 46 MRN: [id number] Neck Size: <br>  CLINICAL INFORMATION Sleep Study Type: HST  Indication for sleep study: untreated OSA, snoring, daytime sleepiniess  Epworth Sleepiness Score: 20  SLEEP STUDY TECHNIQUE A multi-channel overnight portable sleep study was performed. The channels recorded were: nasal airflow, thoracic respiratory movement, and oxygen saturation with a pulse oximetry. Snoring was also monitored.  MEDICATIONS acetaminophen (TYLENOL) 500 MG tablet albuterol (VENTOLIN HFA) 108 (90 Base) MCG/ACT inhaler aspirin 81 MG EC tablet atorvastatin (LIPITOR) 80 MG tablet Continuous Blood Gluc Receiver (DEXCOM G6 RECEIVER) DEVI Continuous Blood Gluc Sensor (DEXCOM G6 SENSOR) MISC Continuous Blood Gluc Transmit (DEXCOM G6 TRANSMITTER) MISC docusate sodium (COLACE) 100 MG capsule empagliflozin (JARDIANCE) 25 MG TABS tablet esomeprazole (NEXIUM) 20 MG capsule ezetimibe (ZETIA) 10 MG tablet FLUoxetine (PROZAC) 40 MG capsule fluticasone (FLONASE) 50 MCG/ACT nasal spray furosemide (LASIX) 20 MG tablet insulin aspart (NOVOLOG) 100 UNIT/ML injection Insulin Disposable Pump (OMNIPOD 10 PACK) MISC Insulin Syringes, Disposable, U-100 1 ML MISC losartan (COZAAR) 50 MG tablet Melatonin 10 MG CAPS metFORMIN (GLUCOPHAGE-XR) 500 MG 24 hr tablet metoprolol succinate (TOPROL-XL) 25 MG 24 hr tablet nitroGLYCERIN (NITROSTAT) 0.4 MG SL tablet pregabalin (LYRICA) 300 MG capsule TRUEPLUS SAFETY LANCETS 28G MISC Patient self administered medications include: N/A.  SLEEP ARCHITECTURE Patient was studied for 418.5 minutes. The sleep efficiency was 100.0 % and the patient was supine for 42%. The arousal index was 0.0 per  hour.  RESPIRATORY PARAMETERS The overall AHI was 28.2 per hour, with a central apnea index of 0 per hour.  The oxygen nadir was 82% during sleep.  CARDIAC DATA Mean heart rate during sleep was 68.2 bpm.  IMPRESSIONS - Moderate obstructive sleep apnea overall (AHI 28.2/h); however, events were severe with supine sleep (AHI 35.8/h).  The severity during REM sleep cannot be assessed on this home study. - Moderate oxygen desaturation to a nadir of 82%. - Patient snored 49.8% during the sleep.  DIAGNOSIS - Obstructive Sleep Apnea (G47.33)  RECOMMENDATIONS - In this patient with significant cardiovascular comorbidities recommend an in-lab CPAP titration study. If unable to have an in-lab titration, initiate Auto-PAP with EPR of 3 at 7 - 18 cm of water. - Effort should be made to optimize nasal and oropharyngeal patency. - Positional therapy avoiding supine position during sleep. - Avoid alcohol, sedatives and other CNS depressants that may worsen sleep apnea and disrupt normal sleep architecture. - Sleep hygiene should be reviewed to assess factors that may improve sleep quality. - Weight management (BMI 46) and regular exercise should be initiated or continued. - Recommend a download and sleep clinic evaluation after one month of therapy.   [Electronically signed] 08/15/2021 08:55 AM  Nicki Guadalajara MD, [facility], ABSM Diplomate, American Board of Sleep Medicine   NPI: [id number]   [facility] PH: [phone]   FX: [phone] ACCREDITED BY THE AMERICAN ACADEMY OF SLEEP MEDICINE

## 2021-08-19 ENCOUNTER — Telehealth: Payer: Self-pay | Admitting: *Deleted

## 2021-08-19 ENCOUNTER — Other Ambulatory Visit: Payer: Self-pay | Admitting: Cardiovascular Disease

## 2021-08-19 DIAGNOSIS — G4733 Obstructive sleep apnea (adult) (pediatric): Secondary | ICD-10-CM

## 2021-08-19 DIAGNOSIS — I10 Essential (primary) hypertension: Secondary | ICD-10-CM

## 2021-08-19 NOTE — Telephone Encounter (Signed)
-----   Message from Lennette Bihari, MD sent at 08/15/2021  9:01 AM EST ----- [name], please notify pt of resutlts and try for in-lab titration; if unable then Auto-PAP

## 2021-08-19 NOTE — Telephone Encounter (Signed)
Patient notified of HST results and recommendations. She agrees to proceed with CPAP titration. Her new insurance will be [place].effective date is 08/31/21. Patient aware that I cannot precert and schedule the appointment until after this time. She gave me a ID # of [id number]. [phone])

## 2021-09-05 ENCOUNTER — Telehealth: Payer: Self-pay | Admitting: Internal Medicine

## 2021-09-05 ENCOUNTER — Other Ambulatory Visit: Payer: Self-pay | Admitting: Internal Medicine

## 2021-09-05 NOTE — Telephone Encounter (Signed)
Script already sent

## 2021-09-05 NOTE — Telephone Encounter (Signed)
Patient called needing approval for refill on  Insulin Disposable Pump (OMNIPOD DASH PODS, GEN 4,) MISC Approval should be sent the pharmacy  [place] [id number] [facility] Phone:  [phone]  Fax:  [phone]

## 2021-09-08 ENCOUNTER — Other Ambulatory Visit (HOSPITAL_COMMUNITY): Payer: Self-pay

## 2021-09-08 ENCOUNTER — Telehealth: Payer: Self-pay | Admitting: Pharmacy Technician

## 2021-09-08 NOTE — Telephone Encounter (Signed)
Patient Advocate Encounter   Received notification from CoverMyMeds that prior authorization for Dexcom is required by his/her insurance [employer] commercial.   PA submitted on 09/08/21  Key  [id number] Status is pending    [facility] will continue to follow:  Patient Advocate Fax:  [phone]

## 2021-09-11 ENCOUNTER — Other Ambulatory Visit (HOSPITAL_COMMUNITY): Payer: Self-pay

## 2021-09-11 NOTE — Telephone Encounter (Signed)
Patient Advocate Encounter  Prior Authorization for Dexcom has been approved.    PA# PA Case ID: [id number] Effective dates: 09/08/21 through 09/10/22  Per Test Claim Patients co-pay is $0.   Spoke with Pharmacy to Process.  Patient Advocate Fax:  [phone]

## 2021-09-23 ENCOUNTER — Other Ambulatory Visit: Payer: Self-pay | Admitting: Internal Medicine

## 2021-09-24 ENCOUNTER — Other Ambulatory Visit: Payer: Self-pay

## 2021-09-24 ENCOUNTER — Telehealth: Payer: Self-pay

## 2021-09-24 ENCOUNTER — Other Ambulatory Visit (HOSPITAL_COMMUNITY): Payer: Self-pay

## 2021-09-24 MED ORDER — EMPAGLIFLOZIN 25 MG PO TABS: 25.0000 mg | ORAL_TABLET | Freq: Every day | ORAL | 1 refills | Status: DC

## 2021-09-24 NOTE — Telephone Encounter (Signed)
Jardiance refilled

## 2021-09-24 NOTE — Telephone Encounter (Signed)
RTC for Jardiance and it needs a PA.  However, [name] is a covered preferred medication.  How would you like to proceed?

## 2021-09-25 ENCOUNTER — Other Ambulatory Visit (HOSPITAL_COMMUNITY): Payer: Self-pay

## 2021-09-26 ENCOUNTER — Other Ambulatory Visit: Payer: Self-pay | Admitting: Internal Medicine

## 2021-09-26 MED ORDER — DAPAGLIFLOZIN PROPANEDIOL 10 MG PO TABS: 10.0000 mg | ORAL_TABLET | Freq: Every day | ORAL | 3 refills | Status: DC

## 2021-10-01 ENCOUNTER — Other Ambulatory Visit: Payer: Self-pay

## 2021-10-01 MED ORDER — INSULIN LISPRO 100 UNIT/ML IJ SOLN: INTRAMUSCULAR | 1 refills | Status: DC

## 2021-10-01 NOTE — Telephone Encounter (Signed)
Novolog changed to Humalog per patient insurance.

## 2021-10-09 ENCOUNTER — Ambulatory Visit (HOSPITAL_BASED_OUTPATIENT_CLINIC_OR_DEPARTMENT_OTHER): Payer: Medicaid Other | Admitting: Cardiovascular Disease

## 2021-10-09 ENCOUNTER — Other Ambulatory Visit: Payer: Self-pay

## 2021-10-10 ENCOUNTER — Ambulatory Visit (HOSPITAL_BASED_OUTPATIENT_CLINIC_OR_DEPARTMENT_OTHER): Payer: Commercial Managed Care - HMO | Attending: Cardiovascular Disease | Admitting: Cardiovascular Disease

## 2021-10-10 DIAGNOSIS — I493 Ventricular premature depolarization: Secondary | ICD-10-CM | POA: Diagnosis not present

## 2021-10-10 DIAGNOSIS — I10 Essential (primary) hypertension: Secondary | ICD-10-CM | POA: Diagnosis not present

## 2021-10-10 DIAGNOSIS — G4733 Obstructive sleep apnea (adult) (pediatric): Secondary | ICD-10-CM | POA: Insufficient documentation

## 2021-10-10 DIAGNOSIS — G4719 Other hypersomnia: Secondary | ICD-10-CM

## 2021-10-15 ENCOUNTER — Other Ambulatory Visit: Payer: Self-pay

## 2021-10-16 ENCOUNTER — Encounter: Payer: Self-pay | Admitting: Cardiology

## 2021-10-17 ENCOUNTER — Other Ambulatory Visit: Payer: Self-pay

## 2021-10-17 MED ORDER — DEXCOM G6 TRANSMITTER MISC: 3 refills | Status: DC

## 2021-10-17 MED ORDER — DAPAGLIFLOZIN PROPANEDIOL 10 MG PO TABS: 10.0000 mg | ORAL_TABLET | Freq: Every day | ORAL | 3 refills | Status: DC

## 2021-10-17 MED ORDER — INSULIN LISPRO 100 UNIT/ML IJ SOLN: INTRAMUSCULAR | 1 refills | Status: DC

## 2021-10-17 MED ORDER — METFORMIN HCL ER 500 MG PO TB24: ORAL_TABLET | ORAL | 1 refills | Status: DC

## 2021-10-22 ENCOUNTER — Other Ambulatory Visit: Payer: Self-pay

## 2021-10-22 MED ORDER — OMNIPOD DASH PODS (GEN 4) MISC: 3 refills | Status: DC

## 2021-10-22 MED ORDER — DEXCOM G6 SENSOR MISC: 3 refills | Status: DC

## 2021-10-29 ENCOUNTER — Encounter (HOSPITAL_BASED_OUTPATIENT_CLINIC_OR_DEPARTMENT_OTHER): Payer: Self-pay | Admitting: Cardiovascular Disease

## 2021-10-29 NOTE — Procedures (Signed)
° ° ° °  Patient Name: [name], [name] Date: 10/10/2021 Gender: Female D.O.B: 05/16/1971 Age (years): 50 Referring Provider: Oswaldo Milian Height (inches): 68 Interpreting Physician: Shelva Majestic MD, ABSM Weight (lbs): 290 RPSGT: Jorge Ny BMI: 44 MRN: [id number] Neck Size: 15.00  CLINICAL INFORMATION The patient is referred for a CPAP titration to treat sleep apnea.  Date of HST: 07/28/2021: AHI 28.2/h; supine AHI 35.8%, snored for 49.8% of sleep.  SLEEP STUDY TECHNIQUE As per the AASM Manual for the Scoring of Sleep and Associated Events v2.3 (April 2016) with a hypopnea requiring 4% desaturations.  The channels recorded and monitored were frontal, central and occipital EEG, electrooculogram (EOG), submentalis EMG (chin), nasal and oral airflow, thoracic and abdominal wall motion, anterior tibialis EMG, snore microphone, electrocardiogram, and pulse oximetry. Continuous positive airway pressure (CPAP) was initiated at the beginning of the study and titrated to treat sleep-disordered breathing.  MEDICATIONS Medications self-administered by patient taken the night of the study : N/A  TECHNICIAN COMMENTS Comments added by technician: None Comments added by scorer: N/A  RESPIRATORY PARAMETERS Optimal PAP Pressure (cm): 15 AHI at Optimal Pressure (/hr): 6.3 Overall Minimal O2 (%): 81.0 Supine % at Optimal Pressure (%): 100 Minimal O2 at Optimal Pressure (%): 83.0   SLEEP ARCHITECTURE The study was initiated at 10:15:45 PM and ended at 5:22:17 AM.  Sleep onset time was 35.1 minutes and the sleep efficiency was 85.1%%. The total sleep time was 363 minutes.  The patient spent 2.9%% of the night in stage N1 sleep, 82.4%% in stage N2 sleep, 0.0%% in stage N3 and 14.7% in REM.Stage REM latency was 244.0 minutes  Wake after sleep onset was 28.5. Alpha intrusion was absent. Supine sleep was 41.12%.  CARDIAC DATA The 2 lead EKG demonstrated sinus rhythm. The  mean heart rate was 64.6 beats per minute. Other EKG findings include: PVCs.  LEG MOVEMENT DATA The total Periodic Limb Movements of Sleep (PLMS) were 0. The PLMS index was 0.0. A PLMS index of <15 is considered normal in adults.  IMPRESSIONS - CPAP was initiated at 5 cm and was titrated to 15 cm of water (AHI 6.3; O2 nadir 83%). - Central sleep apnea was not noted during this titration (CAI 0.7/h). - Moderate oxygen desaturations to a nadir of 81% at 13 cm of water. - No snoring was audible during this study. - 2-lead EKG demonstrated: PVCs - Clinically significant periodic limb movements were not noted during this study. Arousals associated with PLMs were rare.  DIAGNOSIS - Obstructive Sleep Apnea (G47.33)  RECOMMENDATIONS - Recommend an initial trial of CPAP Auto therapy with EPR of 3 at 15 - 20 cm of H2O with heated humidification.  A Medium size Resmed Full Face Mask AirFit F20 mask was used for the titration. - Effort should be made to optimize nasal and oropharyngeal patency. - Avoid alcohol, sedatives and other CNS depressants that may worsen sleep apnea and disrupt normal sleep architecture. - Sleep hygiene should be reviewed to assess factors that may improve sleep quality. - Weight management (BMI 44) and regular exercise should be initiated or continued. - Recommend a download and sleep clinic evaluation after 4 weeks of therapy   [Electronically signed] 10/29/2021 08:54 PM  Shelva Majestic MD, [facility], ABSM Diplomate, American Board of Sleep Medicine   NPI: [id number]  [place] PH: [phone]   FX: [phone] [place]

## 2021-10-30 ENCOUNTER — Telehealth: Payer: Self-pay | Admitting: *Deleted

## 2021-10-30 NOTE — Telephone Encounter (Signed)
-----   Message from Lennette Bihari, MD sent at 10/29/2021  8:59 PM EST ----- ?Burna Mortimer, please notify pt and set up with DME for CPAP initiation ?

## 2021-10-30 NOTE — Telephone Encounter (Signed)
Patient notified CPAP titration has been completed and CPAP order has been sent to [facility] for processing. ?

## 2021-11-03 ENCOUNTER — Other Ambulatory Visit: Payer: Self-pay

## 2021-11-03 MED ORDER — DEXCOM G6 SENSOR MISC: 3 refills | Status: DC

## 2021-11-03 NOTE — Telephone Encounter (Signed)
Script has been sent for Dexcom sensors [facility]  ?

## 2021-11-04 ENCOUNTER — Other Ambulatory Visit: Payer: Self-pay

## 2021-11-04 MED ORDER — ATORVASTATIN CALCIUM 80 MG PO TABS: 80.0000 mg | ORAL_TABLET | Freq: Every day | ORAL | 3 refills | Status: DC

## 2021-11-06 ENCOUNTER — Other Ambulatory Visit: Payer: Self-pay

## 2021-11-06 ENCOUNTER — Other Ambulatory Visit: Payer: Self-pay | Admitting: Internal Medicine

## 2021-11-06 MED ORDER — PREGABALIN 300 MG PO CAPS: 300.0000 mg | ORAL_CAPSULE | Freq: Two times a day (BID) | ORAL | 1 refills | Status: DC

## 2021-11-06 MED ORDER — EZETIMIBE 10 MG PO TABS: 10.0000 mg | ORAL_TABLET | Freq: Every evening | ORAL | 1 refills | Status: DC

## 2021-11-06 NOTE — Telephone Encounter (Signed)
Refill needed on Lyrica ?

## 2021-11-18 NOTE — Progress Notes (Signed)
Office Visit Note  Patient: [name]             Date of Birth: 1971/08/10           MRN: [id number]             PCP: [name], MD Referring: [name], * Visit Date: 12/02/2021 Occupation: @GUAROCC @  Subjective:  Right hip pain  History of Present Illness: [name] is a 51 y.o. female with history of osteoarthritis.  She gradually recovered from the fall 2022.  She fractured her left wrist and left elbow.  She also had surgery on her right wrist and right elbow due to torn tendon.  She states she went through physical therapy which was helpful.  She continues to have intermittent discomfort in her hands.  She reports intermittent swelling in her wrist and hands.  She also gets fluid retention in her lower extremities.  She has been experiencing pain in her right hip which she describes over the trochanteric area.  The left knee joint and left shoulder joint pain resolved.  She has had frequent falls in the past.  She has never had physical therapy for fall prevention and lower extremity muscle strengthening.  She has been going to the gym for the last 2 months.  She has been doing aerobic exercises on the elliptical and treadmill.  Activities of Daily Living:  Patient reports morning stiffness for 1 hour.   Patient Reports nocturnal pain.  Difficulty dressing/grooming: Denies Difficulty climbing stairs: Reports Difficulty getting out of chair: Reports Difficulty using hands for taps, buttons, cutlery, and/or writing: Reports  Review of Systems  Constitutional:  Positive for fatigue.  HENT:  Positive for mouth dryness.   Eyes:  Positive for dryness.  Respiratory:  Negative for difficulty breathing.   Cardiovascular:  Positive for swelling in legs/feet.  Gastrointestinal:  Positive for diarrhea.  Endocrine: Positive for excessive thirst and increased urination.  Genitourinary:  Negative for difficulty urinating.  Musculoskeletal:  Positive for  joint pain, gait problem, joint pain, joint swelling, muscle weakness, morning stiffness and muscle tenderness.  Skin:  Positive for rash.  Allergic/Immunologic: Positive for susceptible to infections.  Neurological:  Positive for numbness and weakness.  Hematological:  Negative for bruising/bleeding tendency.  Psychiatric/Behavioral:  Positive for sleep disturbance.    PMFS History:  Patient Active Problem List   Diagnosis Date Noted   Polyneuropathy associated with underlying disease (HCC) 07/30/2021   Dehydration 12/27/2020   Type 2 diabetes mellitus with hyperglycemia, with long-term current use of insulin (HCC) 11/11/2020   Stage 3a chronic kidney disease (HCC) 04/29/2020   COVID-19 virus vaccination declined 04/29/2020   Influenza vaccination declined 04/29/2020   Midline sternotomy scar 04/17/2020   Left rib fracture 03/13/2020   Moderate episode of recurrent major depressive disorder (HCC) 12/28/2019   Encounter for post surgical wound check 12/06/2019   Type 2 diabetes mellitus with diabetic polyneuropathy, with long-term current use of insulin (HCC) 11/16/2019   Diabetes mellitus (HCC) 11/16/2019   Type 2 diabetes mellitus with retinopathy, with long-term current use of insulin (HCC) 11/16/2019   Dyslipidemia 11/16/2019   Postop check 11/15/2019   Diabetic retinopathy (HCC) 10/25/2019   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   Angina pectoris (HCC) 10/06/2019   Left-sided chest pain 06/13/2019   Post-operative state 06/06/2018   HGSIL (high grade squamous intraepithelial lesion) on Pap smear of cervix 02/25/2018   Right upper quadrant abdominal pain 12/30/2017   Polyarthritis  12/30/2017   OSA (obstructive sleep apnea) 11/22/2013   Dyspnea 11/22/2013   Uncontrolled type 2 diabetes mellitus with hyperosmolar nonketotic hyperglycemia (HCC) 07/21/2012   HTN (hypertension) 07/21/2012   Smoker 07/21/2012    Past Medical History:  Diagnosis Date   Anxiety    Arthritis    fingers    Cervical dysplasia    Coronary artery disease    Depression    Diabetes mellitus    Type II   Dyspnea 11/22/2013   GERD (gastroesophageal reflux disease)    High cholesterol    History of kidney stones    passed   Hypertension    no meds now   Kidney stones    Moderate episode of recurrent major depressive disorder (HCC) 12/28/2019   Neuropathy    feet and legs   Nuclear sclerotic cataract of both eyes 09/2019   Dr. Shea Evans   PONV (postoperative nausea and vomiting)    one time   Sleep apnea    lost 70lbs no cpap x56yrs now   Stage 3a chronic kidney disease (HCC) 04/29/2020    Family History  Problem Relation Age of Onset   Aneurysm Mother    Pancreatic cancer Mother    Heart attack Father    Stroke Father    Heart disease Sister    Breast cancer Paternal Grandmother    Congestive Heart Failure Sister    Osteoarthritis Sister    Osteoarthritis Sister    Past Surgical History:  Procedure Laterality Date   CARDIAC CATHETERIZATION     4-107yrs ago   CERVICAL CONIZATION W/BX N/A 05/04/2018   Procedure: CONIZATION CERVIX WITH BIOPSY - COLD KNIFE;  Surgeon: Hermina Staggers, MD;  Location: [facility];  Service: Gynecology;  Laterality: N/A;   CHOLECYSTECTOMY     CORONARY ARTERY BYPASS GRAFT N/A 10/24/2019   Procedure: CORONARY ARTERY BYPASS GRAFTING (CABG) times four, using left radial artery harvested endoscopically and right greater saphenous vein harvested endoscopically.;  Surgeon: Kerin Perna, MD;  Location: [facility];  Service: Open Heart Surgery;  Laterality: N/A;   ELBOW FRACTURE SURGERY Left    ELBOW SURGERY Right    ENDOMETRIAL ABLATION     6 years ago   INCISION AND DRAINAGE     for boil- buttocks   INCISION AND DRAINAGE Left    Leg, Nephrotoxin fasciotimy   IRRIGATION AND DEBRIDEMENT ABSCESS Right 03/17/2017   Procedure: IRRIGATION AND DEBRIDEMENT RIGHT THIGH ABSCESS;  Surgeon: Karie Soda, MD;  Location: [facility];  Service: General;  Laterality:  Right;   LAPAROSCOPIC APPENDECTOMY  10/04/2011   Procedure: APPENDECTOMY LAPAROSCOPIC;  Surgeon: Rulon Abide, DO;  Location: [facility];  Service: General;  Laterality: N/A;   LEFT HEART CATH AND CORONARY ANGIOGRAPHY N/A 10/06/2019   Procedure: LEFT HEART CATH AND CORONARY ANGIOGRAPHY;  Surgeon: Swaziland, Peter M, MD;  Location: [facility];  Service: Cardiovascular;  Laterality: N/A;   RADIAL ARTERY HARVEST Left 10/24/2019   Procedure: Radial Artery Harvest;  Surgeon: Kerin Perna, MD;  Location: [facility] OR;  Service: Open Heart Surgery;  Laterality: Left;   TEE WITHOUT CARDIOVERSION N/A 10/24/2019   Procedure: TRANSESOPHAGEAL ECHOCARDIOGRAM (TEE);  Surgeon: Donata Clay, Theron Arista, MD;  [facility];  Service: Open Heart Surgery;  Laterality: N/A;   TENDON RECONSTRUCTION Right 07/02/2021   Procedure: Right wrist 6 dorsal compartment tenosynovectomy and tendon repair Right elbow lateral epicondylar debridement and tendon repair;  Surgeon: Bradly Bienenstock, MD;  Location: [facility];  Service: Orthopedics;  Laterality: Right;   WRIST FRACTURE SURGERY Left    WRIST SURGERY Right    Social History   Social History Narrative   Not on file   Immunization History  Administered Date(s) Administered   Influenza Split 05/31/2013   Influenza,inj,Quad PF,6+ Mos 06/05/2019   Pneumococcal Polysaccharide-23 03/29/2017   Tdap 01/27/2018     Objective: Vital Signs: BP 124/75 (BP Location: Left Arm, Patient Position: Sitting, Cuff Size: Normal)   Pulse 75   Resp 16   Ht 5' 8.5" (1.74 m)   Wt 299 lb (135.6 kg)   LMP  (LMP Unknown)   BMI 44.80 kg/m    Physical Exam Vitals and nursing note reviewed.  Constitutional:      Appearance: She is well-developed.  HENT:     Head: Normocephalic and atraumatic.  Eyes:     Conjunctiva/sclera: Conjunctivae normal.  Cardiovascular:     Rate and Rhythm: Normal rate and regular rhythm.     Heart sounds: Normal heart sounds.  Pulmonary:     Effort:  Pulmonary effort is normal.     Breath sounds: Normal breath sounds.  Abdominal:     General: Bowel sounds are normal.     Palpations: Abdomen is soft.  Musculoskeletal:     Cervical back: Normal range of motion.  Lymphadenopathy:     Cervical: No cervical adenopathy.  Skin:    General: Skin is warm and dry.     Capillary Refill: Capillary refill takes less than 2 seconds.  Neurological:     Mental Status: She is alert and oriented to person, place, and time.  Psychiatric:        Behavior: Behavior normal.     Musculoskeletal Exam: C-spine was in good range of motion.  Shoulder joints with good range of motion.  Elbow joints with good range of motion.  There was no tenderness over wrist joints or MCPs.  PIP and DIP thickening consistent with osteoarthritis was noted.  Hip joints with good range of motion.  She had tenderness over right trochanteric bursa and over the right anterior superior iliac spine.  Knee joints with good range of motion.  There was no tenderness over ankles or MTPs.  CDAI Exam: CDAI Score: -- Patient Global: --; Provider Global: -- Swollen: --; Tender: -- Joint Exam 12/02/2021   No joint exam has been documented for this visit   There is currently no information documented on the homunculus. Go to the Rheumatology activity and complete the homunculus joint exam.  Investigation: No additional findings.  Imaging: No results found.  Recent Labs: Lab Results  Component Value Date   WBC 5.6 07/02/2021   HGB 12.1 07/02/2021   PLT 192 07/02/2021   NA 141 07/30/2021   K 4.4 07/30/2021   CL 103 07/30/2021   CO2 30 07/30/2021   GLUCOSE 127 (H) 07/30/2021   BUN 23 07/30/2021   CREATININE 1.42 (H) 07/30/2021   BILITOT 0.4 12/27/2020   ALKPHOS 88 12/27/2020   AST 17 12/27/2020   ALT 18 12/27/2020   PROT 6.4 (L) 12/27/2020   ALBUMIN 3.8 12/27/2020   CALCIUM 9.3 07/30/2021   GFRAA 51 (L) 10/18/2020    Speciality Comments: No specialty comments  available.  Procedures:  No procedures performed Allergies: Sulfa antibiotics and Codeine   Assessment / Plan:     Visit Diagnoses: Primary osteoarthritis of both hands-she complains of pain and discomfort in her bilateral hands.  She had bilateral PIP and DIP thickening.  An exercise was emphasized.  Trochanteric bursitis of both hips-she has been having tenderness in her trochanteric area intermittently.  Her right trochanteric bursa was more tender.  She also had tenderness over her anterior superior iliac spine.  I will refer her to physical therapy.  Chronic pain of left knee -the pain in her left knee resolved.  X-rays obtained were unremarkable.  Primary osteoarthritis of both feet - X-rays were consistent with osteoarthritis. All autoimmune work-up was negative.  Frequent falls-patient had a fall in May 2022.  She has had several falls before.  She has not had physical therapy for fall prevention and lower extremity muscle strengthening.  I will refer her to physical therapy.  Other medical problems listed as follows:  Essential hypertension  Uncontrolled type 2 diabetes mellitus with hyperosmolar nonketotic hyperglycemia (HCC)  S/P CABG x 4  History of gastroesophageal reflux (GERD)  History of kidney stones  Dyslipidemia  Nuclear sclerotic cataract of both eyes  HGSIL (high grade squamous intraepithelial lesion) on Pap smear of cervix  Anxiety and depression  OSA (obstructive sleep apnea)  Former smoker  Postmenopausal  Osteoporosis screening-I will schedule DEXA scan as she is postmenopausal.  Orders: No orders of the defined types were placed in this encounter.  No orders of the defined types were placed in this encounter.    Follow-Up Instructions: Return in about 3 months (around 03/03/2022) for Osteoarthritis.   Pollyann Savoy, MD  Note - This record has been created using Animal nutritionist.  Chart creation errors have been sought, but may not  always  have been located. Such creation errors do not reflect on  the standard of medical care.

## 2021-11-27 ENCOUNTER — Other Ambulatory Visit (HOSPITAL_COMMUNITY): Payer: Self-pay

## 2021-11-27 ENCOUNTER — Encounter: Payer: Self-pay | Admitting: Internal Medicine

## 2021-11-27 ENCOUNTER — Telehealth (HOSPITAL_COMMUNITY): Payer: Self-pay | Admitting: Pharmacy Technician

## 2021-11-27 ENCOUNTER — Ambulatory Visit: Payer: Managed Care, Other (non HMO) | Admitting: Internal Medicine

## 2021-11-27 VITALS — BP 124/80 | HR 70 | Ht 68.0 in | Wt 304.0 lb

## 2021-11-27 DIAGNOSIS — G63 Polyneuropathy in diseases classified elsewhere: Secondary | ICD-10-CM | POA: Diagnosis not present

## 2021-11-27 DIAGNOSIS — E1159 Type 2 diabetes mellitus with other circulatory complications: Secondary | ICD-10-CM

## 2021-11-27 DIAGNOSIS — E1165 Type 2 diabetes mellitus with hyperglycemia: Secondary | ICD-10-CM

## 2021-11-27 DIAGNOSIS — E11319 Type 2 diabetes mellitus with unspecified diabetic retinopathy without macular edema: Secondary | ICD-10-CM

## 2021-11-27 DIAGNOSIS — E1142 Type 2 diabetes mellitus with diabetic polyneuropathy: Secondary | ICD-10-CM

## 2021-11-27 DIAGNOSIS — Z794 Long term (current) use of insulin: Secondary | ICD-10-CM | POA: Diagnosis not present

## 2021-11-27 DIAGNOSIS — E785 Hyperlipidemia, unspecified: Secondary | ICD-10-CM

## 2021-11-27 DIAGNOSIS — N1831 Chronic kidney disease, stage 3a: Secondary | ICD-10-CM

## 2021-11-27 LAB — POCT GLYCOSYLATED HEMOGLOBIN (HGB A1C): Hemoglobin A1C: 7.7 % — AB (ref 4.0–5.6)

## 2021-11-27 MED ORDER — METFORMIN HCL ER 500 MG PO TB24: ORAL_TABLET | ORAL | 3 refills | Status: DC

## 2021-11-27 MED ORDER — OZEMPIC (0.25 OR 0.5 MG/DOSE) 2 MG/3ML ~~LOC~~ SOPN: 0.5000 mg | PEN_INJECTOR | SUBCUTANEOUS | 3 refills | Status: DC

## 2021-11-27 MED ORDER — LOSARTAN POTASSIUM 50 MG PO TABS: 50.0000 mg | ORAL_TABLET | Freq: Every day | ORAL | 3 refills | Status: DC

## 2021-11-27 NOTE — Telephone Encounter (Signed)
Patient Advocate Encounter ? ?Prior Authorization for Ozempic (0.25 or 0.5 MG/DOSE) 2MG /3ML pen-injectors ? has been approved.   ? ?PA# [id number] ?Effective dates: 11/27/2021 through 11/27/2022 ? ?Patients co-pay is $466.01.  ? ? ? ?Roland Earl, CPhT ?Pharmacy Patient Advocate Specialist ?[facility] Patient Advocate Team ?Direct Number: [phone]  Fax: [phone]  ?

## 2021-11-27 NOTE — Telephone Encounter (Signed)
Patient Advocate Encounter ?  ?Received notification that prior authorization for Ozempic (0.25 or 0.5 mg/Dose) 2 mg/3ML pen-injectors is required. ?  ?PA submitted on 11/27/2021 ?Key [id number] ?Status is pending ?   ? ? ? ?Roland EarlJeffrey Rithik Odea, CPhT ?Pharmacy Patient Advocate Specialist ?[facility] Patient Advocate Team ?Direct Number: [phone]  Fax: [phone]  ?

## 2021-11-27 NOTE — Patient Instructions (Addendum)
-   Start Ozempic 0.25 mg weekly , after 6 weeks please increase to 0.5 mg weekly if not side effects  ?- Continue Metformin 500 mg XR , 2 tablet with breakfast  ?- Continue  Farxiga 10 mg , 1 tablet daily with Breakfast  ? ? ? ? ?Pump   Omnipod Settings   ?Insulin type   NOVOLOG    ?Basal rate      ? 0000-0600 2.4 u/h   ? 0600-0000 2.15  ?    ?    ?I:C ratio      ? 0000 1:8  ?    ?    ?    ?    ?Sensitivity      ? 0000  20  ?    ?Goal      ? 0000  110-120  ? ? ? ?HOW TO TREAT LOW BLOOD SUGARS (Blood sugar LESS THAN 70 MG/DL) ?Please follow the RULE OF 15 for the treatment of hypoglycemia treatment (when your (blood sugars are less than 70 mg/dL)  ? ?STEP 1: Take 15 grams of carbohydrates when your blood sugar is low, which includes:  ?3-4 GLUCOSE TABS  OR ?3-4 OZ OF JUICE OR REGULAR SODA OR ?ONE TUBE OF GLUCOSE GEL   ? ?STEP 2: RECHECK blood sugar in 15 MINUTES ?STEP 3: If your blood sugar is still low at the 15 minute recheck --> then, go back to STEP 1 and treat AGAIN with another 15 grams of carbohydrates. ? ?

## 2021-11-27 NOTE — Progress Notes (Signed)
?Name: [name]  ?Age/ Sex: 51 y.o., female   ?MRN/ DOB: [id number], 12-14-1970    ? ?PCP: Glenis Smoker, MD   ?Reason for Endocrinology Evaluation: Type 2 Diabetes Mellitus  ?Initial Endocrine Consultative Visit: 11/16/2019  ? ? ?PATIENT IDENTIFIER: [name] is a 51 y.o. female with a past medical history of CAD, T2DM and HTN. The patient has followed with Endocrinology clinic since 11/16/2019 for consultative assistance with management of her diabetes. ? ?DIABETIC HISTORY:  ?Ms. Rowden was diagnosed with DM in 2004. She is intolerant to higher doses of metformin, nor to Trulicity/ Victoza  due to diarrhea. She was able to tolerate Victoza which was started 05/2019. Her hemoglobin A1c has ranged from 7.2% in 2013, peaking at >15.0% in 2019. ? ? ? ?On her initial visit to our clinic she had an A1c of 15.1% . She was on Levemir, Metformin  And victoza and we added [place].  ? ?Victoza was stopped 05/2020 due to "severe nausea" ? ?She was started on the Omnipod 07/2020 ? ? ?Victoza stopped 05/2020 due to nausea and diarrhea  ?Metformin reduced by 50% due to GI issues  ? ? ? ?Switched Gabapentin to Lyrica 10/2020 ?SUBJECTIVE:  ? ?During the last visit (05/08/2021): A1c 9.8  %.We  continued  metformin and Jardiance and adjusted pump setting  ? ? ? ? ? ?Today (11/27/2021): [name] is here for a follow up on diabetes management.  She checks her blood sugars multiple times a day through the Dexcom  ? ?She has noted hypoglycemia since being on the Optiva diet , which was started 10/01/2021 ? ?Lyrica has worked wonder for neuropathy  ? ?Denies nausea, vomiting or diarrhea  ? ?Follows with nephrology  ? ? ? ? ? ?This patient with type 2 diabetes is treated with Omnipod (insulin pump). During the visit the pump basal and bolus doses were reviewed including carb/insulin rations and supplemental doses. The clinical list was updated. The glucose meter download was reviewed in detail to determine if the  current pump settings are providing the best glycemic control without excessive hypoglycemia. ? ? ? ?Pump and meter download: ? ? ? ? ?Pump   Omnipod Settings   ?Insulin type   NOVOLOG    ?Basal rate      ? 0000 2.4 u/h   ?    ?    ?    ?I:C ratio      ? 0000 1:6  ?    ?    ?    ?    ?Sensitivity      ? 0000  20  ?    ?Goal      ? 0000  110-120  ? ? ? ? ? ?Type & Model of Pump: Omnipod ?Insulin Type: Currently using Novolog  ? ? ?PUMP STATISTICS: ?Average BG: 214 ?BG Readings: 1/ day ?Average Daily Carbs (g):  26.7 ?Average Total Daily Insulin: 62.1 ?Average Daily Basal: 62.1(90 %) ?Average Daily Bolus: 6.5 (10 %) ? ? ? ? ? ?HOME DIABETES REGIMEN:  ?Metformin 500 mg XR , 2 tablet with breakfast  ? Farxiga 10 mg daily  ? ? ? ? ?Statin: Yes ?ACE-I/ARB: no ? ? ? ? ?CONTINUOUS GLUCOSE MONITORING RECORD INTERPRETATION   ? ?Dates of Recording: 3/17-3/30/2023 ? ?Sensor description:Dexcom ? ?Results statistics: ?  ?CGM use % of time 93  ?Average and SD 177/62  ?Time in range   52%  ?% Time Above 180 34  ?%  Time above 250 12  ?% Time Below target 1  ? ? ? ?Glycemic patterns summary: Hyperglycemia noted overnight with optimal to low Bg's during the day  ? ?Hyperglycemic episodes  postprandial  ? ?Hypoglycemic episodes occurred  during the day after a bolus  ? ?Overnight periods: high  ? ? ? ? ? ?DIABETIC COMPLICATIONS: ?Microvascular complications:  ?Neuropathy, retinopathy ?Denies: CKD ?Last Eye Exam: Completed  ? ?Macrovascular complications:  ?CAD ( S/P CABG 10/2019) ?Denies:  CVA, PVD ? ? ?HISTORY:  ?Past Medical History:  ?Past Medical History:  ?Diagnosis Date  ? Anxiety   ? Arthritis   ? fingers  ? Cervical dysplasia   ? Coronary artery disease   ? Depression   ? Diabetes mellitus   ? Type II  ? Dyspnea 11/22/2013  ? GERD (gastroesophageal reflux disease)   ? High cholesterol   ? History of kidney stones   ? passed  ? Hypertension   ? no meds now  ? Kidney stones   ? Moderate episode of recurrent major depressive  disorder ([place]) 12/28/2019  ? Neuropathy   ? feet and legs  ? Nuclear sclerotic cataract of both eyes 09/2019  ? Dr. Idolina Primer  ? PONV (postoperative nausea and vomiting)   ? one time  ? Sleep apnea   ? lost 70lbs no cpap x35yrs now  ? Stage 3a chronic kidney disease ([place]) 04/29/2020  ? ?Past Surgical History:  ?Past Surgical History:  ?Procedure Laterality Date  ? CARDIAC CATHETERIZATION    ? 4-48yrs ago  ? CERVICAL CONIZATION W/BX N/A 05/04/2018  ? Procedure: CONIZATION CERVIX WITH BIOPSY - COLD KNIFE;  Surgeon: Chancy Milroy, MD;  Location: [place];  Service: Gynecology;  Laterality: N/A;  ? CHOLECYSTECTOMY    ? CORONARY ARTERY BYPASS GRAFT N/A 10/24/2019  ? Procedure: CORONARY ARTERY BYPASS GRAFTING (CABG) times four, using left radial artery harvested endoscopically and right greater saphenous vein harvested endoscopically.;  Surgeon: Ivin Poot, MD;  Location: [place];  Service: Open Heart Surgery;  Laterality: N/A;  ? ENDOMETRIAL ABLATION    ? 6 years ago  ? INCISION AND DRAINAGE    ? for boil- buttocks  ? INCISION AND DRAINAGE Left   ? Leg, Nephrotoxin fasciotimy  ? IRRIGATION AND DEBRIDEMENT ABSCESS Right 03/17/2017  ? Procedure: IRRIGATION AND DEBRIDEMENT RIGHT THIGH ABSCESS;  Surgeon: Michael Boston, MD;  Location: [facility];  Service: General;  Laterality: Right;  ? LAPAROSCOPIC APPENDECTOMY  10/04/2011  ? Procedure: APPENDECTOMY LAPAROSCOPIC;  Surgeon: Judieth Keens, DO;  Location: [facility];  Service: General;  Laterality: N/A;  ? LEFT HEART CATH AND CORONARY ANGIOGRAPHY N/A 10/06/2019  ? Procedure: LEFT HEART CATH AND CORONARY ANGIOGRAPHY;  Surgeon: Martinique, Peter M, MD;  Location: [place] [facility];  Service: Cardiovascular;  Laterality: N/A;  ? RADIAL ARTERY HARVEST Left 10/24/2019  ? Procedure: Radial Artery Harvest;  Surgeon: Ivin Poot, MD;  Location: [place];  Service: Open Heart Surgery;  Laterality: Left;  ? TEE WITHOUT CARDIOVERSION N/A 10/24/2019  ? Procedure: TRANSESOPHAGEAL  ECHOCARDIOGRAM (TEE);  Surgeon: Prescott Gum, Collier Salina, MD;  Location: [place];  Service: Open Heart Surgery;  Laterality: N/A;  ? TENDON RECONSTRUCTION Right 07/02/2021  ? Procedure: Right wrist 6 dorsal compartment tenosynovectomy and tendon repair Right elbow lateral epicondylar debridement and tendon repair;  Surgeon: Iran Planas, MD;  Location: [place];  Service: Orthopedics;  Laterality: Right;  ? ?Social History:  reports that she quit smoking about 4  years ago. Her smoking use included e-cigarettes and cigarettes. She has a 48.00 pack-year smoking history. She has never used smokeless tobacco. She reports current alcohol use. She reports that she does not use drugs. ?Family History:  ?Family History  ?Problem Relation Age of Onset  ? Aneurysm Mother   ? Pancreatic cancer Mother   ? Heart attack Father   ? Stroke Father   ? Heart disease Sister   ? Breast cancer Paternal Grandmother   ? Congestive Heart Failure Sister   ? Osteoarthritis Sister   ? Osteoarthritis Sister   ? ? ? ?HOME MEDICATIONS: ?Allergies as of 11/27/2021   ? ?   Reactions  ? Sulfa Antibiotics Hives  ? Codeine Itching  ? ?  ? ?  ?Medication List  ?  ? ?  ? Accurate as of November 27, 2021  8:07 AM. If you have any questions, ask your nurse or doctor.  ?  ?  ? ?  ? ?acetaminophen 500 MG tablet ?Commonly known as: TYLENOL ?Take 1,000 mg by mouth every 6 (six) hours as needed for moderate pain or headache. ?  ?albuterol 108 (90 Base) MCG/ACT inhaler ?Commonly known as: VENTOLIN HFA ?Inhale 1-2 puffs into the lungs every 6 (six) hours as needed for wheezing or shortness of breath. ?  ?aspirin 81 MG EC tablet ?Take 1 tablet (81 mg total) by mouth daily. ?  ?atorvastatin 80 MG tablet ?Commonly known as: LIPITOR ?Take 1 tablet (80 mg total) by mouth daily. TAKE 1 TABLET(80 MG) BY MOUTH DAILY AT 6 PM ?  ?dapagliflozin propanediol 10 MG Tabs tablet ?Commonly known as: [place] ?Take 1 tablet (10 mg total) by mouth daily before breakfast. ?  ?Dexcom G6 Receiver  Kerrin Mo ?Use as instructed to check blood sugar daily ?  ?Dexcom G6 Sensor Misc ?USE 1 DEVICE AS DIRECTED ?  ?Dexcom G6 Transmitter Misc ?USE AS DIRECTED ?  ?docusate sodium 100 MG capsule ?Commonly known as: Colace ?Take 1

## 2021-12-01 ENCOUNTER — Other Ambulatory Visit: Payer: Self-pay | Admitting: Cardiology

## 2021-12-01 NOTE — Telephone Encounter (Signed)
Patient notified and verbalized understanding. 

## 2021-12-01 NOTE — Telephone Encounter (Signed)
Patient can't afford the co- pay for the Ozempic. ?

## 2021-12-02 ENCOUNTER — Encounter: Payer: Self-pay | Admitting: Rheumatology

## 2021-12-02 ENCOUNTER — Ambulatory Visit: Payer: Managed Care, Other (non HMO) | Admitting: Rheumatology

## 2021-12-02 ENCOUNTER — Telehealth: Payer: Self-pay

## 2021-12-02 VITALS — BP 124/75 | HR 75 | Resp 16 | Ht 68.5 in | Wt 299.0 lb

## 2021-12-02 DIAGNOSIS — M7062 Trochanteric bursitis, left hip: Secondary | ICD-10-CM

## 2021-12-02 DIAGNOSIS — M25562 Pain in left knee: Secondary | ICD-10-CM | POA: Diagnosis not present

## 2021-12-02 DIAGNOSIS — M19071 Primary osteoarthritis, right ankle and foot: Secondary | ICD-10-CM | POA: Diagnosis not present

## 2021-12-02 DIAGNOSIS — M19072 Primary osteoarthritis, left ankle and foot: Secondary | ICD-10-CM

## 2021-12-02 DIAGNOSIS — R87613 High grade squamous intraepithelial lesion on cytologic smear of cervix (HGSIL): Secondary | ICD-10-CM

## 2021-12-02 DIAGNOSIS — G8929 Other chronic pain: Secondary | ICD-10-CM

## 2021-12-02 DIAGNOSIS — M7061 Trochanteric bursitis, right hip: Secondary | ICD-10-CM

## 2021-12-02 DIAGNOSIS — M19041 Primary osteoarthritis, right hand: Secondary | ICD-10-CM

## 2021-12-02 DIAGNOSIS — F32A Depression, unspecified: Secondary | ICD-10-CM

## 2021-12-02 DIAGNOSIS — R296 Repeated falls: Secondary | ICD-10-CM

## 2021-12-02 DIAGNOSIS — E785 Hyperlipidemia, unspecified: Secondary | ICD-10-CM

## 2021-12-02 DIAGNOSIS — I10 Essential (primary) hypertension: Secondary | ICD-10-CM

## 2021-12-02 DIAGNOSIS — M19042 Primary osteoarthritis, left hand: Secondary | ICD-10-CM

## 2021-12-02 DIAGNOSIS — Z8719 Personal history of other diseases of the digestive system: Secondary | ICD-10-CM

## 2021-12-02 DIAGNOSIS — G4733 Obstructive sleep apnea (adult) (pediatric): Secondary | ICD-10-CM

## 2021-12-02 DIAGNOSIS — Z87442 Personal history of urinary calculi: Secondary | ICD-10-CM

## 2021-12-02 DIAGNOSIS — Z13820 Encounter for screening for osteoporosis: Secondary | ICD-10-CM

## 2021-12-02 DIAGNOSIS — Z780 Asymptomatic menopausal state: Secondary | ICD-10-CM

## 2021-12-02 DIAGNOSIS — Z87891 Personal history of nicotine dependence: Secondary | ICD-10-CM

## 2021-12-02 DIAGNOSIS — F419 Anxiety disorder, unspecified: Secondary | ICD-10-CM

## 2021-12-02 DIAGNOSIS — Z951 Presence of aortocoronary bypass graft: Secondary | ICD-10-CM

## 2021-12-02 DIAGNOSIS — E1100 Type 2 diabetes mellitus with hyperosmolarity without nonketotic hyperglycemic-hyperosmolar coma (NKHHC): Secondary | ICD-10-CM

## 2021-12-02 DIAGNOSIS — H2513 Age-related nuclear cataract, bilateral: Secondary | ICD-10-CM

## 2021-12-02 NOTE — Patient Instructions (Signed)
Knee Exercises ?Ask your health care provider which exercises are safe for you. Do exercises exactly as told by your health care provider and adjust them as directed. It is normal to feel mild stretching, pulling, tightness, or discomfort as you do these exercises. Stop right away if you feel sudden pain or your pain gets worse. Do not begin these exercises until told by your health care provider. ?Stretching and range-of-motion exercises ?These exercises warm up your muscles and joints and improve the movement and flexibility of your knee. These exercises also help to relieve pain and swelling. ?Knee extension, prone ? ?Lie on your abdomen (prone position) on a bed. ?Place your left / right knee just beyond the edge of the surface so your knee is not on the bed. You can put a towel under your left / right thigh just above your kneecap for comfort. ?Relax your leg muscles and allow gravity to straighten your knee (extension). You should feel a stretch behind your left / right knee. ?Hold this position for __________ seconds. ?Scoot up so your knee is supported between repetitions. ?Repeat __________ times. Complete this exercise __________ times a day. ?Knee flexion, active ? ?Lie on your back with both legs straight. If this causes back discomfort, bend your left / right knee so your foot is flat on the floor. ?Slowly slide your left / right heel back toward your buttocks. Stop when you feel a gentle stretch in the front of your knee or thigh (flexion). ?Hold this position for __________ seconds. ?Slowly slide your left / right heel back to the starting position. ?Repeat __________ times. Complete this exercise __________ times a day. ?Quadriceps stretch, prone ? ?Lie on your abdomen on a firm surface, such as a bed or padded floor. ?Bend your left / right knee and hold your ankle. If you cannot reach your ankle or pant leg, loop a belt around your foot and grab the belt instead. ?Gently pull your heel toward your  buttocks. Your knee should not slide out to the side. You should feel a stretch in the front of your thigh and knee (quadriceps). ?Hold this position for __________ seconds. ?Repeat __________ times. Complete this exercise __________ times a day. ?Hamstring, supine ? ?Lie on your back (supine position). ?Loop a belt or towel over the ball of your left / right foot. The ball of your foot is on the walking surface, right under your toes. ?Straighten your left / right knee and slowly pull on the belt to raise your leg until you feel a gentle stretch behind your knee (hamstring). ?Do not let your knee bend while you do this. ?Keep your other leg flat on the floor. ?Hold this position for __________ seconds. ?Repeat __________ times. Complete this exercise __________ times a day. ?Strengthening exercises ?These exercises build strength and endurance in your knee. Endurance is the ability to use your muscles for a long time, even after they get tired. ?Quadriceps, isometric ?This exercise strengthens the muscles in front of your thigh (quadriceps) without moving your knee joint (isometric). ?Lie on your back with your left / right leg extended and your other knee bent. Put a rolled towel or small pillow under your knee if told by your health care provider. ?Slowly tense the muscles in the front of your left / right thigh. You should see your kneecap slide up toward your hip or see increased dimpling just above the knee. This motion will push the back of the knee toward the floor. ?  For __________ seconds, hold the muscle as tight as you can without increasing your pain. ?Relax the muscles slowly and completely. ?Repeat __________ times. Complete this exercise __________ times a day. ?Straight leg raises ?This exercise strengthens the muscles in front of your thigh (quadriceps) and the muscles that move your hips (hip flexors). ?Lie on your back with your left / right leg extended and your other knee bent. ?Tense the  muscles in the front of your left / right thigh. You should see your kneecap slide up or see increased dimpling just above the knee. Your thigh may even shake a bit. ?Keep these muscles tight as you raise your leg 4-6 inches (10-15 cm) off the floor. Do not let your knee bend. ?Hold this position for __________ seconds. ?Keep these muscles tense as you lower your leg. ?Relax your muscles slowly and completely after each repetition. ?Repeat __________ times. Complete this exercise __________ times a day. ?Hamstring, isometric ? ?Lie on your back on a firm surface. ?Bend your left / right knee about __________ degrees. ?Dig your left / right heel into the surface as if you are trying to pull it toward your buttocks. Tighten the muscles in the back of your thighs (hamstring) to "dig" as hard as you can without increasing any pain. ?Hold this position for __________ seconds. ?Release the tension gradually and allow your muscles to relax completely for __________ seconds after each repetition. ?Repeat __________ times. Complete this exercise __________ times a day. ?Hamstring curls ?If told by your health care provider, do this exercise while wearing ankle weights. Begin with __________lb / kg weights. Then increase the weight by 1 lb (0.5 kg) increments. Do not wear ankle weights that are more than __________lb / kg. ?Lie on your abdomen with your legs straight. ?Bend your left / right knee as far as you can without feeling pain. Keep your hips flat against the floor. ?Hold this position for __________ seconds. ?Slowly lower your leg to the starting position. ?Repeat __________ times. Complete this exercise __________ times a day. ?Squats ?This exercise strengthens the muscles in front of your thigh and knee (quadriceps). ?Stand in front of a table, with your feet and knees pointing straight ahead. You may rest your hands on the table for balance but not for support. ?Slowly bend your knees and lower your hips like you  are going to sit in a chair. ?Keep your weight over your heels, not over your toes. ?Keep your lower legs upright so they are parallel with the table legs. ?Do not let your hips go lower than your knees. ?Do not bend lower than told by your health care provider. ?If your knee pain increases, do not bend as low. ?Hold the squat position for __________ seconds. ?Slowly push with your legs to return to standing. Do not use your hands to pull yourself to standing. ?Repeat __________ times. Complete this exercise __________ times a day. ?Wall slides ?This exercise strengthens the muscles in front of your thigh and knee (quadriceps). ?Lean your back against a smooth wall or door, and walk your feet out 18-24 inches (46-61 cm) from it. ?Place your feet hip-width apart. ?Slowly slide down the wall or door until your knees bend __________ degrees. Keep your knees over your heels, not over your toes. Keep your knees in line with your hips. ?Hold this position for __________ seconds. ?Repeat __________ times. Complete this exercise __________ times a day. ?Straight leg raises, side-lying ?This exercise strengthens the muscles that rotate   the leg at the hip and move it away from your body (hip abductors). ?Lie on your side with your left / right leg in the top position. Lie so your head, shoulder, knee, and hip line up. You may bend your bottom knee to help you keep your balance. ?Roll your hips slightly forward so your hips are stacked directly over each other and your left / right knee is facing forward. ?Leading with your heel, lift your top leg 4-6 inches (10-15 cm). You should feel the muscles in your outer hip lifting. ?Do not let your foot drift forward. ?Do not let your knee roll toward the ceiling. ?Hold this position for __________ seconds. ?Slowly return your leg to the starting position. ?Let your muscles relax completely after each repetition. ?Repeat __________ times. Complete this exercise __________ times a  day. ?Straight leg raises, prone ?This exercise stretches the muscles that move your hips away from the front of the pelvis (hip extensors). ?Lie on your abdomen on a firm surface. You can put a pillow under yo

## 2021-12-02 NOTE — Addendum Note (Signed)
Addended by: Geroge Baseman C on: 12/02/2021 02:05 PM ? ? Modules accepted: Orders ? ?

## 2021-12-02 NOTE — Telephone Encounter (Signed)
Patient was able to get a coupon for the Ozempic and will pick it up  ?

## 2021-12-03 ENCOUNTER — Ambulatory Visit: Payer: Managed Care, Other (non HMO) | Admitting: Cardiology

## 2021-12-03 ENCOUNTER — Encounter: Payer: Self-pay | Admitting: Cardiology

## 2021-12-03 VITALS — BP 128/80 | HR 71 | Ht 68.5 in | Wt 301.2 lb

## 2021-12-03 DIAGNOSIS — Z6841 Body Mass Index (BMI) 40.0 and over, adult: Secondary | ICD-10-CM

## 2021-12-03 DIAGNOSIS — R600 Localized edema: Secondary | ICD-10-CM | POA: Diagnosis not present

## 2021-12-03 DIAGNOSIS — Z720 Tobacco use: Secondary | ICD-10-CM

## 2021-12-03 DIAGNOSIS — I2510 Atherosclerotic heart disease of native coronary artery without angina pectoris: Secondary | ICD-10-CM

## 2021-12-03 DIAGNOSIS — Z951 Presence of aortocoronary bypass graft: Secondary | ICD-10-CM

## 2021-12-03 DIAGNOSIS — E785 Hyperlipidemia, unspecified: Secondary | ICD-10-CM

## 2021-12-03 DIAGNOSIS — Z79899 Other long term (current) drug therapy: Secondary | ICD-10-CM | POA: Diagnosis not present

## 2021-12-03 LAB — BASIC METABOLIC PANEL
BUN/Creatinine Ratio: 25 — ABNORMAL HIGH (ref 9–23)
BUN: 40 mg/dL — ABNORMAL HIGH (ref 6–24)
CO2: 23 mmol/L (ref 20–29)
Calcium: 9.7 mg/dL (ref 8.7–10.2)
Chloride: 101 mmol/L (ref 96–106)
Creatinine, Ser: 1.62 mg/dL — ABNORMAL HIGH (ref 0.57–1.00)
Glucose: 146 mg/dL — ABNORMAL HIGH (ref 70–99)
Potassium: 4.4 mmol/L (ref 3.5–5.2)
Sodium: 139 mmol/L (ref 134–144)
eGFR: 38 mL/min/{1.73_m2} — ABNORMAL LOW (ref >59)

## 2021-12-03 LAB — MAGNESIUM: Magnesium: 2.1 mg/dL (ref 1.6–2.3)

## 2021-12-03 MED ORDER — NITROGLYCERIN 0.4 MG SL SUBL: 0.4000 mg | SUBLINGUAL_TABLET | SUBLINGUAL | 3 refills | Status: DC | PRN

## 2021-12-03 NOTE — Patient Instructions (Signed)
Medication Instructions:  Your physician recommends that you continue on your current medications as directed. Please refer to the Current Medication list given to you today.  *If you need a refill on your cardiac medications before your next appointment, please call your pharmacy*  Lab Work: BMET, Mag today  If you have labs (blood work) drawn today and your tests are completely normal, you will receive your results only by: MyChart Message (if you have MyChart) OR A paper copy in the mail If you have any lab test that is abnormal or we need to change your treatment, we will call you to review the results.  Follow-Up: At [facility], you and your health needs are our priority.  As part of our continuing mission to provide you with exceptional heart care, we have created designated Provider Care Teams.  These Care Teams include your primary Cardiologist (physician) and Advanced Practice Providers (APPs -  Physician Assistants and Nurse Practitioners) who all work together to provide you with the care you need, when you need it.  We recommend signing up for the patient portal called "MyChart".  Sign up information is provided on this After Visit Summary.  MyChart is used to connect with patients for Virtual Visits (Telemedicine).  Patients are able to view lab/test results, encounter notes, upcoming appointments, etc.  Non-urgent messages can be sent to your provider as well.   To learn more about what you can do with MyChart, go to [email or web address].    Your next appointment:   6 month(s)  The format for your next appointment:   In Person  Provider:   Christopher L Schumann, MD {     

## 2021-12-03 NOTE — Progress Notes (Signed)
?Cardiology Office Note:   ? ?Date:  12/03/2021  ? ?ID:  [name], DOB Jul 08, 1971, MRN [id number] ? ?PCP:  Glenis Smoker, MD  ?Cardiologist:  Donato Heinz, MD  ?Electrophysiologist:  None  ? ?Referring MD: Glenis Smoker, *  ? ?Chief Complaint  ?Patient presents with  ? Coronary Artery Disease  ? ? ?History of Present Illness:   ? ?[name] is a 51 y.o. female with a hx of coronary artery disease status post CABG x4 on 10/24/2019 (LIMA-LAD, radial-diagonal, SVG-diagonal, SVG-PDA), type 2 diabetes, hypertension, hyperlipidemia who presents for a follow-up evaluation.  Patient was admitted to [facility] on 06/13/19 with chest pain.  EKG was unremarkable and high-sensitivity troponins were negative.  However due to her extensive risk factors including uncontrolled diabetes, coronary CTA was ordered.  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  She was discharged on medical management: atorvastatin 80 mg, Imdur 30 mg.  TTE showed EF 60-65%. ? ?During subsequent office visits, her antianginal regimen was titrated as she continued to report chest pain.  At last clinic visit on 10/02/2019, she was on metoprolol 25 mg twice daily and Imdur 60 mg daily, but further titration was limited by low blood pressures.  Given inability to further titrate up antianginals and having persistent chest pain, cardiac catheterization was ordered.  Underwent cath on 10/06/2019, which showed three-vessel CAD: 80% mid LAD, 85% slitlike ostial D1, 80% ostial D2, 80% small PDA, anomalous circumflex off the RCA cusp with moderate diffuse disease.  Recommendation was for aggressive medical management and if  symptoms persist consider surgical consultation.  She continued to have chest pain despite medical management, so was referred to cardiac surgery.  She underwent CABG x4 on 10/24/2019 (LIMA-LAD, radial-diagonal, SVG-diagonal, SVG-PDA) with Dr. Prescott Gum.  Echocardiogram on 09/20/2018 showed biventricular function, no significant valvular disease.  Stress CMR on 11/05/2020 showed no evidence of ischemia, no LGE, LVEF 61%, RVEF 60%. ? ?Since last clinic visit, she reports has been doing okay.  Reports occasional heaviness in chest, seems to occur after eating.  She walks on treadmill 20 minutes at least 3 times per week.  No exertional symptoms.  Occasional lightheadedness with standing.  Reports lower extremity edema if she does not take her Lasix. ? ? ?Wt Readings from Last 3 Encounters:  ?12/03/21 (!) 301 lb 3.2 oz (136.6 kg)  ?12/02/21 299 lb (135.6 kg)  ?11/27/21 (!) 304 lb (137.9 kg)  ? ? ? ?Past Medical History:  ?Diagnosis Date  ? Anxiety   ? Arthritis   ? fingers  ? Cervical dysplasia   ? Coronary artery disease   ? Depression   ? Diabetes mellitus   ? Type II  ? Dyspnea 11/22/2013  ? GERD (gastroesophageal reflux disease)   ? High cholesterol   ? History of kidney stones   ? passed  ? Hypertension   ? no meds now  ? Kidney stones   ? Moderate episode of recurrent major depressive disorder ([place]) 12/28/2019  ? Neuropathy   ? feet and legs  ? Nuclear sclerotic cataract of both eyes 09/2019  ? Dr. Idolina Primer  ? PONV (postoperative nausea and vomiting)   ? one time  ?  Sleep apnea   ? lost 70lbs no cpap x95yrs now  ? Stage 3a chronic kidney disease ([place]) 04/29/2020  ? ? ?Past Surgical History:  ?Procedure Laterality Date  ? CARDIAC CATHETERIZATION    ? 4-27yrs ago  ? CERVICAL CONIZATION W/BX N/A 05/04/2018  ? Procedure: CONIZATION CERVIX WITH BIOPSY - COLD KNIFE;  Surgeon: Chancy Milroy, MD;  Location: [place];  Service: Gynecology;  Laterality: N/A;  ? CHOLECYSTECTOMY    ? CORONARY ARTERY BYPASS GRAFT  N/A 10/24/2019  ? Procedure: CORONARY ARTERY BYPASS GRAFTING (CABG) times four, using left radial artery harvested endoscopically and right greater saphenous vein harvested endoscopically.;  Surgeon: Ivin Poot, MD;  Location: [place];  Service: Open Heart Surgery;  Laterality: N/A;  ? ELBOW FRACTURE SURGERY Left   ? ELBOW SURGERY Right   ? ENDOMETRIAL ABLATION    ? 6 years ago  ? INCISION AND DRAINAGE    ? for boil- buttocks  ? INCISION AND DRAINAGE Left   ? Leg, Nephrotoxin fasciotimy  ? IRRIGATION AND DEBRIDEMENT ABSCESS Right 03/17/2017  ? Procedure: IRRIGATION AND DEBRIDEMENT RIGHT THIGH ABSCESS;  Surgeon: Michael Boston, MD;  Location: [facility];  Service: General;  Laterality: Right;  ? LAPAROSCOPIC APPENDECTOMY  10/04/2011  ? Procedure: APPENDECTOMY LAPAROSCOPIC;  Surgeon: Judieth Keens, DO;  Location: [facility];  Service: General;  Laterality: N/A;  ? LEFT HEART CATH AND CORONARY ANGIOGRAPHY N/A 10/06/2019  ? Procedure: LEFT HEART CATH AND CORONARY ANGIOGRAPHY;  Surgeon: Martinique, Peter M, MD;  Location: [place] [facility];  Service: Cardiovascular;  Laterality: N/A;  ? RADIAL ARTERY HARVEST Left 10/24/2019  ? Procedure: Radial Artery Harvest;  Surgeon: Ivin Poot, MD;  Location: [place];  Service: Open Heart Surgery;  Laterality: Left;  ? TEE WITHOUT CARDIOVERSION N/A 10/24/2019  ? Procedure: TRANSESOPHAGEAL ECHOCARDIOGRAM (TEE);  Surgeon: Prescott Gum, Collier Salina, MD;  Location: [place];  Service: Open Heart Surgery;  Laterality: N/A;  ? TENDON RECONSTRUCTION Right 07/02/2021  ? Procedure: Right wrist 6 dorsal compartment tenosynovectomy and tendon repair Right elbow lateral epicondylar debridement and tendon repair;  Surgeon: Iran Planas, MD;  Location: [place];  Service: Orthopedics;  Laterality: Right;  ? WRIST FRACTURE SURGERY Left   ? WRIST SURGERY Right   ? ? ?Current Medications: ?Current Meds  ?Medication Sig  ? acetaminophen (TYLENOL) 500 MG tablet Take 1,000 mg by mouth every 6 (six) hours as needed  for moderate pain or headache.  ? albuterol (VENTOLIN HFA) 108 (90 Base) MCG/ACT inhaler Inhale 1-2 puffs into the lungs every 6 (six) hours as needed for wheezing or shortness of breath.  ? atorvastatin (LIPITOR) 80 MG tablet Take 1 tablet (80 mg total) by mouth daily. TAKE 1 TABLET(80 MG) BY MOUTH DAILY AT 6 PM  ? Continuous Blood Gluc Receiver (DEXCOM G6 RECEIVER) DEVI Use as instructed to check blood sugar daily  ? Continuous Blood Gluc Sensor (DEXCOM G6 SENSOR) MISC USE 1 DEVICE AS DIRECTED  ? Continuous Blood Gluc Transmit (DEXCOM G6 TRANSMITTER) MISC USE AS DIRECTED  ? dapagliflozin propanediol (FARXIGA) 10 MG TABS tablet Take 1 tablet (10 mg total) by mouth daily before breakfast.  ? docusate sodium (COLACE) 100 MG capsule Take 1 capsule (100 mg total) by mouth 2 (two) times daily.  ? esomeprazole (NEXIUM) 20 MG capsule Take 20 mg by mouth daily.   ? ezetimibe (ZETIA) 10 MG tablet Take 1 tablet (10 mg total) by mouth every evening.  ? FLUoxetine (PROZAC) 40  MG capsule TAKE 1 CAPSULE(40 MG) BY MOUTH DAILY (Patient taking differently: Take 40 mg by mouth daily. TAKE 1 CAPSULE(40 MG) BY MOUTH DAILY)  ? fluticasone (FLONASE) 50 MCG/ACT nasal spray Place 2 sprays into both nostrils daily as needed for allergies or rhinitis.  ? furosemide (LASIX) 20 MG tablet TAKE 1 TABLET(20 MG) BY MOUTH DAILY  ? Insulin Disposable Pump (OMNIPOD DASH PODS, GEN 4,) MISC REPLACE POD EVERY 72 HOURS AS DIRECTED  ? insulin lispro (HUMALOG) 100 UNIT/ML injection Inject a max of 60 units per pump as directed  ? Insulin Syringes, Disposable, U-100 1 ML MISC 1 Device by Does not apply route as directed.  ? LAGEVRIO 200 MG CAPS capsule   ? Melatonin 10 MG CAPS Take 10 mg by mouth at bedtime as needed (sleep).  ? metFORMIN (GLUCOPHAGE-XR) 500 MG 24 hr tablet TAKE 2 TABLETS(1000 MG) BY MOUTH DAILY WITH BREAKFAST  ? metoprolol succinate (TOPROL-XL) 25 MG 24 hr tablet TAKE 1/2 TABLET BY MOUTH EVERY DAY  ? NOVOLOG 100 UNIT/ML injection INJECT A  MAX OF 60 UNITS PER PUMP AS DIRECTED  ? pregabalin (LYRICA) 300 MG capsule Take 1 capsule (300 mg total) by mouth 2 (two) times daily.  ? Semaglutide,0.25 or 0.5MG /DOS, (OZEMPIC, 0.25 OR 0.5 MG/DOSE,) 2 MG/3

## 2021-12-04 ENCOUNTER — Other Ambulatory Visit: Payer: Self-pay | Admitting: *Deleted

## 2021-12-04 DIAGNOSIS — Z79899 Other long term (current) drug therapy: Secondary | ICD-10-CM

## 2021-12-04 DIAGNOSIS — R7989 Other specified abnormal findings of blood chemistry: Secondary | ICD-10-CM

## 2021-12-04 MED ORDER — FUROSEMIDE 20 MG PO TABS: 20.0000 mg | ORAL_TABLET | Freq: Every day | ORAL | 3 refills | Status: DC | PRN

## 2021-12-09 ENCOUNTER — Encounter: Payer: Self-pay | Admitting: Cardiology

## 2021-12-09 ENCOUNTER — Emergency Department (HOSPITAL_COMMUNITY)
Admission: EM | Admit: 2021-12-09 | Discharge: 2021-12-09 | Discharge status: Against Medical Advice | Payer: Commercial Managed Care - HMO | Attending: Emergency Medicine | Admitting: Emergency Medicine

## 2021-12-09 ENCOUNTER — Emergency Department (HOSPITAL_COMMUNITY): Payer: Commercial Managed Care - HMO

## 2021-12-09 ENCOUNTER — Encounter (HOSPITAL_COMMUNITY): Payer: Self-pay | Admitting: Emergency Medicine

## 2021-12-09 DIAGNOSIS — R079 Chest pain, unspecified: Secondary | ICD-10-CM | POA: Insufficient documentation

## 2021-12-09 DIAGNOSIS — Z5321 Procedure and treatment not carried out due to patient leaving prior to being seen by health care provider: Secondary | ICD-10-CM | POA: Insufficient documentation

## 2021-12-09 LAB — BASIC METABOLIC PANEL
Anion gap: 12 (ref 5–15)
BUN: 32 mg/dL — ABNORMAL HIGH (ref 6–20)
CO2: 20 mmol/L — ABNORMAL LOW (ref 22–32)
Calcium: 9.1 mg/dL (ref 8.9–10.3)
Chloride: 107 mmol/L (ref 98–111)
Creatinine, Ser: 1.47 mg/dL — ABNORMAL HIGH (ref 0.44–1.00)
GFR, Estimated: 43 mL/min — ABNORMAL LOW (ref >60)
Glucose, Bld: 197 mg/dL — ABNORMAL HIGH (ref 70–99)
Potassium: 4.4 mmol/L (ref 3.5–5.1)
Sodium: 139 mmol/L (ref 135–145)

## 2021-12-09 LAB — CBC
HCT: 42.2 % (ref 36.0–46.0)
Hemoglobin: 12.6 g/dL (ref 12.0–15.0)
MCH: 23.7 pg — ABNORMAL LOW (ref 26.0–34.0)
MCHC: 29.9 g/dL — ABNORMAL LOW (ref 30.0–36.0)
MCV: 79.3 fL — ABNORMAL LOW (ref 80.0–100.0)
Platelets: 173 10*3/uL (ref 150–400)
RBC: 5.32 MIL/uL — ABNORMAL HIGH (ref 3.87–5.11)
RDW: 15.7 % — ABNORMAL HIGH (ref 11.5–15.5)
WBC: 6.1 10*3/uL (ref 4.0–10.5)
nRBC: 0 % (ref 0.0–0.2)

## 2021-12-09 LAB — TROPONIN I (HIGH SENSITIVITY): Troponin I (High Sensitivity): 3 ng/L (ref <18)

## 2021-12-09 NOTE — Telephone Encounter (Signed)
Called patient of Dr. Gardiner Rhyme ?She reports chest pain since 1130am - came on suddenly ?"Reminiscent" of the chest pain she had prior to CABG ?She describes this as a deep pain in left chest ?She took NTG x1 - in 1-1.33mins the pain eased up but returned after 30-40 mins - pain came back strong ?BP 151/80 HR 64, 152/80, HR 64 - checked after 10 mins 153/80 HR 64 ?She also reports it is unusual for her to have elevated BP also  ? ?She states she left work and was going home to rest. Advised she needs to go to [facility] for evaluation  ? ?Paged [employer] consultant) and notified of situation.  ?

## 2021-12-09 NOTE — ED Notes (Signed)
Called x 2 NO answer 

## 2021-12-09 NOTE — ED Triage Notes (Signed)
Patient here with complaint of sudden onset of chest pain that started at 1130 today. Patient took one SL NTG and the pain resolved for thirty minutes, then returned as a more dull discomfort which persisted the rest of the day. Patient denies shortness of breath, dizziness, reports pain radiates into left neck and left arm. Patient is alert, oriented, and in no apparent distress at this time. ?

## 2021-12-09 NOTE — ED Notes (Signed)
Pt called X4 no answer. ?

## 2021-12-09 NOTE — ED Notes (Signed)
Called x 3 NO answer 

## 2021-12-10 NOTE — Telephone Encounter (Signed)
Agree with plan 

## 2021-12-26 NOTE — Therapy (Signed)
?OUTPATIENT PHYSICAL THERAPY LOWER EXTREMITY EVALUATION ? ? ?Patient Name: [name] ?MRN: [id number] ?DOB:02-Jun-1971, 51 y.o., female ?Today's Date: 12/30/2021 ? ? PT End of Session - 12/30/21 1017   ? ? Visit Number 1   ? Number of Visits 8   ? Date for PT Re-Evaluation 01/27/22   ? Authorization Type Cign[employer]   ? PT Start Time 1530   ? PT Stop Time 1615   ? PT Time Calculation (min) 45 min   ? Activity Tolerance Patient tolerated treatment well   ? Behavior During Therapy [facility] for tasks assessed/performed   ? ?  ?  ? ?  ? ? ?Past Medical History:  ?Diagnosis Date  ? Anxiety   ? Arthritis   ? fingers  ? Cervical dysplasia   ? Coronary artery disease   ? Depression   ? Diabetes mellitus   ? Type II  ? Dyspnea 11/22/2013  ? GERD (gastroesophageal reflux disease)   ? High cholesterol   ? History of kidney stones   ? passed  ? Hypertension   ? no meds now  ? Kidney stones   ? Moderate episode of recurrent major depressive disorder ([place]) 12/28/2019  ? Neuropathy   ? feet and legs  ? Nuclear sclerotic cataract of both eyes 09/2019  ? Dr. Idolina Primer  ? PONV (postoperative nausea and vomiting)   ? one time  ? Sleep apnea   ? lost 70lbs no cpap x100yrs now  ? Stage 3a chronic kidney disease ([place]) 04/29/2020  ? ?Past Surgical History:  ?Procedure Laterality Date  ? CARDIAC CATHETERIZATION    ? 4-29yrs ago  ? CERVICAL CONIZATION W/BX N/A 05/04/2018  ? Procedure: CONIZATION CERVIX WITH BIOPSY - COLD KNIFE;  Surgeon: Chancy Milroy, MD;  Location: [place];  Service: Gynecology;  Laterality: N/A;  ? CHOLECYSTECTOMY    ? CORONARY ARTERY BYPASS GRAFT N/A 10/24/2019  ? Procedure: CORONARY ARTERY BYPASS GRAFTING (CABG) times four, using left radial artery harvested endoscopically and right greater saphenous vein harvested endoscopically.;  Surgeon: Ivin Poot, MD;  Location: [place];  Service: Open Heart Surgery;  Laterality: N/A;  ? ELBOW FRACTURE SURGERY Left   ? ELBOW SURGERY Right   ? ENDOMETRIAL ABLATION    ? 6  years ago  ? INCISION AND DRAINAGE    ? for boil- buttocks  ? INCISION AND DRAINAGE Left   ? Leg, Nephrotoxin fasciotimy  ? IRRIGATION AND DEBRIDEMENT ABSCESS Right 03/17/2017  ? Procedure: IRRIGATION AND DEBRIDEMENT RIGHT THIGH ABSCESS;  Surgeon: Michael Boston, MD;  [facility];  Service: General;  Laterality: Right;  ? LAPAROSCOPIC APPENDECTOMY  10/04/2011  ? Procedure: APPENDECTOMY LAPAROSCOPIC;  Surgeon: Judieth Keens, DO;  Location: [facility];  Service: General;  Laterality: N/A;  ? LEFT HEART CATH AND CORONARY ANGIOGRAPHY N/A 10/06/2019  ? Procedure: LEFT HEART CATH AND CORONARY ANGIOGRAPHY;  Surgeon: Martinique, Peter M, MD;  Location: [place] [facility];  Service: Cardiovascular;  Laterality: N/A;  ? RADIAL ARTERY HARVEST Left 10/24/2019  ? Procedure: Radial Artery Harvest;  Surgeon: Ivin Poot, MD;  Location: [place];  Service: Open Heart Surgery;  Laterality: Left;  ? TEE WITHOUT CARDIOVERSION N/A 10/24/2019  ? Procedure: TRANSESOPHAGEAL ECHOCARDIOGRAM (TEE);  Surgeon: Prescott Gum, Collier Salina, MD;  Location: [place];  Service: Open Heart Surgery;  Laterality: N/A;  ? TENDON RECONSTRUCTION Right 07/02/2021  ? Procedure: Right wrist 6 dorsal compartment tenosynovectomy and tendon repair Right elbow lateral epicondylar debridement and  tendon repair;  Surgeon: Bradly Bienenstock, MD;  [facility]: [facility];  Service: Orthopedics;  Laterality: Right;  ? WRIST FRACTURE SURGERY Left   ? WRIST SURGERY Right   ? ?Patient Active Problem List  ? Diagnosis Date Noted  ? Polyneuropathy associated with underlying disease (HCC) 07/30/2021  ? Dehydration 12/27/2020  ? Type 2 diabetes mellitus with hyperglycemia, with long-term current use of insulin (HCC) 11/11/2020  ? Stage 3a chronic kidney disease (HCC) 04/29/2020  ? COVID-19 virus vaccination declined 04/29/2020  ? Influenza vaccination declined 04/29/2020  ? Midline sternotomy scar 04/17/2020  ? Left rib fracture 03/13/2020  ? Moderate episode of recurrent major depressive  disorder (HCC) 12/28/2019  ? Encounter for post surgical wound check 12/06/2019  ? Type 2 diabetes mellitus with diabetic polyneuropathy, with long-term current use of insulin (HCC) 11/16/2019  ? Diabetes mellitus (HCC) 11/16/2019  ? Type 2 diabetes mellitus with retinopathy, with long-term current use of insulin (HCC) 11/16/2019  ? Dyslipidemia 11/16/2019  ? Postop check 11/15/2019  ? Diabetic retinopathy (HCC) 10/25/2019  ? 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  ? Angina pectoris (HCC) 10/06/2019  ? Left-sided chest pain 06/13/2019  ? Post-operative state 06/06/2018  ? HGSIL (high grade squamous intraepithelial lesion) on Pap smear of cervix 02/25/2018  ? Right upper quadrant abdominal pain 12/30/2017  ? Polyarthritis 12/30/2017  ? OSA (obstructive sleep apnea) 11/22/2013  ? Dyspnea 11/22/2013  ? Uncontrolled type 2 diabetes mellitus with hyperosmolar nonketotic hyperglycemia (HCC) 07/21/2012  ? HTN (hypertension) 07/21/2012  ? Smoker 07/21/2012  ? ? ?PCP: [name], MD ? ?REFERRING PROVIDER: Pollyann Savoy, MD ? ?REFERRING DIAG: M70.61,M70.62 (ICD-10-CM) - Trochanteric bursitis of both hips R29.6 (ICD-10-CM) - Frequent falls  ? ?THERAPY DIAG: Trochanteric bursitis of R hip, frequent falls ? ? ?ONSET DATE: 12/2020 fall ? ?SUBJECTIVE:  ? ?SUBJECTIVE STATEMENT: ?R hip pain ongoing for over 6 months, improving slowly, denies radiating pain, localized to R gluteal region ? ?PERTINENT HISTORY: ?History of Present Illness: [name] is a 51 y.o. female with history of osteoarthritis.  She gradually recovered from the fall 2022.  She fractured her left wrist and left elbow.  She also had surgery on her right wrist and right elbow due to torn tendon.  She states she went through physical therapy which was helpful.  She continues to have intermittent discomfort in her hands.  She reports intermittent swelling in her wrist and hands.  She also gets fluid retention in her lower extremities.  She has been experiencing  pain in her right hip which she describes over the trochanteric area.  The left knee joint and left shoulder joint pain resolved.  She has had frequent falls in the past.  She has never had physical therapy for fall prevention and lower extremity muscle strengthening.  She has been going to the gym for the last 2 months.  She has been doing aerobic exercises on the elliptical and treadmill. ? ?PAIN:  ?Are you having pain? Yes: NPRS scale: 2/10 ?Pain location: R hip ?Pain description: sharp ?Aggravating factors: stairs ?Relieving factors: rest  ? ?PRECAUTIONS: None ? ?WEIGHT BEARING RESTRICTIONS No ? ?FALLS:  ?Has patient fallen in last 6 months? No ? ?LIVING ENVIRONMENT: ?Lives with: lives with their family ?Lives in: House/apartment ?Stairs: Yes: Internal: 16 steps; B rails ? ?OCCUPATION: desk work and Estate agent work ? ?PLOF: Independent ? ?PATIENT GOALS To reduce and manage my pain ? ? ?OBJECTIVE:  ? ?DIAGNOSTIC FINDINGS: Visit Diagnoses: Primary osteoarthritis of both hands-she  complains of pain and discomfort in her bilateral hands.  She had bilateral PIP and DIP thickening.  An exercise was emphasized. ?  ?Trochanteric bursitis of both hips-she has been having tenderness in her trochanteric area intermittently.  Her right trochanteric bursa was more tender.  She also had tenderness over her anterior superior iliac spine.  I will refer her to physical therapy. ?  ?Chronic pain of left knee -the pain in her left knee resolved.  X-rays obtained were unremarkable. ?  ?Primary osteoarthritis of both feet - X-rays were consistent with osteoarthritis. All autoimmune work-up was negative. ?  ?Frequent falls-patient had a fall in May 2022.  She has had several falls before.  She has not had physical therapy for fall prevention and lower extremity muscle strengthening.  I will refer her to physical therapy. ? ?PATIENT SURVEYS:  ?FOTO 41(49 predicted) ? ?COGNITION: ? Overall cognitive status: Within functional limits for  tasks assessed   ?  ?SENSATION: ?Not tested ? ?MUSCLE LENGTH: ?Hamstrings: Right 45 deg; ? ?POSTURE:  ?Anterior PT ? ?PALPATION: ?TTP R piriformis  ? ?LE ROM: ? ?A/PROM Right ?12/30/2021 Left ?12/30/2021  ?Hip fl

## 2021-12-29 ENCOUNTER — Ambulatory Visit: Payer: Commercial Managed Care - HMO | Attending: Rheumatology

## 2021-12-29 DIAGNOSIS — M6281 Muscle weakness (generalized): Secondary | ICD-10-CM | POA: Diagnosis present

## 2021-12-29 DIAGNOSIS — M7062 Trochanteric bursitis, left hip: Secondary | ICD-10-CM | POA: Insufficient documentation

## 2021-12-29 DIAGNOSIS — R2681 Unsteadiness on feet: Secondary | ICD-10-CM

## 2021-12-29 DIAGNOSIS — M7061 Trochanteric bursitis, right hip: Secondary | ICD-10-CM | POA: Diagnosis present

## 2021-12-29 DIAGNOSIS — R296 Repeated falls: Secondary | ICD-10-CM | POA: Insufficient documentation

## 2022-01-05 ENCOUNTER — Ambulatory Visit: Payer: Commercial Managed Care - HMO

## 2022-01-07 ENCOUNTER — Ambulatory Visit: Payer: Commercial Managed Care - HMO

## 2022-01-07 ENCOUNTER — Telehealth: Payer: Self-pay

## 2022-01-07 NOTE — Telephone Encounter (Signed)
TC due to second no show, VM left reminding of next appointment and attendance policy. ?

## 2022-01-12 ENCOUNTER — Ambulatory Visit: Payer: Commercial Managed Care - HMO

## 2022-01-14 ENCOUNTER — Ambulatory Visit: Payer: Commercial Managed Care - HMO

## 2022-01-19 ENCOUNTER — Ambulatory Visit: Payer: Commercial Managed Care - HMO

## 2022-01-21 ENCOUNTER — Ambulatory Visit: Payer: Commercial Managed Care - HMO

## 2022-01-21 DIAGNOSIS — M6281 Muscle weakness (generalized): Secondary | ICD-10-CM

## 2022-01-21 DIAGNOSIS — M7061 Trochanteric bursitis, right hip: Secondary | ICD-10-CM | POA: Diagnosis not present

## 2022-01-21 DIAGNOSIS — R2681 Unsteadiness on feet: Secondary | ICD-10-CM

## 2022-01-21 NOTE — Therapy (Signed)
OUTPATIENT PHYSICAL THERAPY TREATMENT NOTE   Patient Name: [name] MRN: [id number] DOB:[birth year], 51 y.o., female Today's Date: 01/21/2022  PCP: Glenis Smoker, MD REFERRING PROVIDER: Bo Merino, MD  END OF SESSION:   PT End of Session - 01/21/22 1613     Visit Number 2    Number of Visits 8    Date for PT Re-Evaluation 01/27/22    Authorization Type Cigna    PT Start Time 1615    PT Stop Time 1655    PT Time Calculation (min) 40 min    Activity Tolerance Patient tolerated treatment well    Behavior During Therapy [facility] for tasks assessed/performed             Past Medical History:  Diagnosis Date   Anxiety    Arthritis    fingers   Cervical dysplasia    Coronary artery disease    Depression    Diabetes mellitus    Type II   Dyspnea 11/22/2013   GERD (gastroesophageal reflux disease)    High cholesterol    History of kidney stones    passed   Hypertension    no meds now   Kidney stones    Moderate episode of recurrent major depressive disorder ([place]) 12/28/2019   Neuropathy    feet and legs   Nuclear sclerotic cataract of both eyes 09/2019   Dr. Idolina Primer   PONV (postoperative nausea and vomiting)    one time   Sleep apnea    lost 70lbs no cpap x74yrs now   Stage 3a chronic kidney disease ([place]) 04/29/2020   Past Surgical History:  Procedure Laterality Date   CARDIAC CATHETERIZATION     4-61yrs ago   CERVICAL CONIZATION W/BX N/A 05/04/2018   Procedure: CONIZATION CERVIX WITH BIOPSY - COLD KNIFE;  Surgeon: Chancy Milroy, MD;  Location: [place];  Service: Gynecology;  Laterality: N/A;   CHOLECYSTECTOMY     CORONARY ARTERY BYPASS GRAFT N/A 10/24/2019   Procedure: CORONARY ARTERY BYPASS GRAFTING (CABG) times four, using left radial artery harvested endoscopically and right greater saphenous vein harvested endoscopically.;  Surgeon: Ivin Poot, MD;  Location: [place];  Service: Open Heart Surgery;  Laterality: N/A;    ELBOW FRACTURE SURGERY Left    ELBOW SURGERY Right    ENDOMETRIAL ABLATION     6 years ago   INCISION AND DRAINAGE     for boil- buttocks   INCISION AND DRAINAGE Left    Leg, Nephrotoxin fasciotimy   IRRIGATION AND DEBRIDEMENT ABSCESS Right 03/17/2017   Procedure: IRRIGATION AND DEBRIDEMENT RIGHT THIGH ABSCESS;  Surgeon: Michael Boston, MD;  [facility];  Service: General;  Laterality: Right;   LAPAROSCOPIC APPENDECTOMY  10/04/2011   Procedure: APPENDECTOMY LAPAROSCOPIC;  Surgeon: Judieth Keens, DO;  [facility];  Service: General;  Laterality: N/A;   LEFT HEART CATH AND CORONARY ANGIOGRAPHY N/A 10/06/2019   Procedure: LEFT HEART CATH AND CORONARY ANGIOGRAPHY;  Surgeon: Martinique, Peter M, MD;  Location: [place] [facility];  Service: Cardiovascular;  Laterality: N/A;   RADIAL ARTERY HARVEST Left 10/24/2019   Procedure: Radial Artery Harvest;  Surgeon: Ivin Poot, MD;  Location: [place];  Service: Open Heart Surgery;  Laterality: Left;   TEE WITHOUT CARDIOVERSION N/A 10/24/2019   Procedure: TRANSESOPHAGEAL ECHOCARDIOGRAM (TEE);  Surgeon: Prescott Gum, Collier Salina, MD;  Location: [place];  Service: Open Heart Surgery;  Laterality: N/A;   TENDON RECONSTRUCTION Right 07/02/2021   Procedure: Right  wrist 6 dorsal compartment tenosynovectomy and tendon repair Right elbow lateral epicondylar debridement and tendon repair;  Surgeon: Iran Planas, MD;  Location: [place];  Service: Orthopedics;  Laterality: Right;   WRIST FRACTURE SURGERY Left    WRIST SURGERY Right    Patient Active Problem List   Diagnosis Date Noted   Polyneuropathy associated with underlying disease ([place]) 07/30/2021   Dehydration 12/27/2020   Type 2 diabetes mellitus with hyperglycemia, with long-term current use of insulin ([place]) 11/11/2020   Stage 3a chronic kidney disease ([place]) 04/29/2020   COVID-19 virus vaccination declined 04/29/2020   Influenza vaccination declined 04/29/2020   Midline sternotomy scar 04/17/2020    Left rib fracture 03/13/2020   Moderate episode of recurrent major depressive disorder ([place]) 12/28/2019   Encounter for post surgical wound check 12/06/2019   Type 2 diabetes mellitus with diabetic polyneuropathy, with long-term current use of insulin ([place]) 11/16/2019   Diabetes mellitus ([place]) 11/16/2019   Type 2 diabetes mellitus with retinopathy, with long-term current use of insulin ([place]) 11/16/2019   Dyslipidemia 11/16/2019   Postop check 11/15/2019   Diabetic retinopathy ([place]) 10/25/2019   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   Angina pectoris ([place]) 10/06/2019   Left-sided chest pain 06/13/2019   Post-operative state 06/06/2018   HGSIL (high grade squamous intraepithelial lesion) on Pap smear of cervix 02/25/2018   Right upper quadrant abdominal pain 12/30/2017   Polyarthritis 12/30/2017   OSA (obstructive sleep apnea) 11/22/2013   Dyspnea 11/22/2013   Uncontrolled type 2 diabetes mellitus with hyperosmolar nonketotic hyperglycemia ([place]) 07/21/2012   HTN (hypertension) 07/21/2012   Smoker 07/21/2012    REFERRING DIAG: M70.61,M70.62 (ICD-10-CM) - Trochanteric bursitis of both hips R29.6 (ICD-10-CM) - Frequent falls   THERAPY DIAG: Trochanteric bursitis of R hip, frequent falls  Rationale for Evaluation and Treatment Rehabilitation  PERTINENT HISTORY: History of Present Illness: [name] is a 51 y.o. female with history of osteoarthritis.  She gradually recovered from the fall 2022.  She fractured her left wrist and left elbow.  She also had surgery on her right wrist and right elbow due to torn tendon.  She states she went through physical therapy which was helpful.  She continues to have intermittent discomfort in her hands.  She reports intermittent swelling in her wrist and hands.  She also gets fluid retention in her lower extremities.  She has been experiencing pain in her right hip which she describes over the trochanteric area.  The left knee joint and left shoulder joint pain  resolved.  She has had frequent falls in the past.  She has never had physical therapy for fall prevention and lower extremity muscle strengthening.  She has been going to the gym for the last 2 months.  She has been doing aerobic exercises on the elliptical and treadmill.  PRECAUTIONS: fall  SUBJECTIVE: Returns from an out of town trip due to death in family.  No change in hip pain in spite of stress.  Does relate that stair climbing is easier  PAIN:  Are you having pain? Yes: NPRS scale: 8/10 Pain location: R hip  Pain description: ache Aggravating factors: pressure Relieving factors: position change   OBJECTIVE: (objective measures completed at initial evaluation unless otherwise dated)   OBJECTIVE:    DIAGNOSTIC FINDINGS: Visit Diagnoses: Primary osteoarthritis of both hands-she complains of pain and discomfort in her bilateral hands.  She had bilateral PIP and DIP thickening.  An exercise was emphasized.   Trochanteric bursitis of both hips-she  has been having tenderness in her trochanteric area intermittently.  Her right trochanteric bursa was more tender.  She also had tenderness over her anterior superior iliac spine.  I will refer her to physical therapy.   Chronic pain of left knee -the pain in her left knee resolved.  X-rays obtained were unremarkable.   Primary osteoarthritis of both feet - X-rays were consistent with osteoarthritis. All autoimmune work-up was negative.   Frequent falls-patient had a fall in May 2022.  She has had several falls before.  She has not had physical therapy for fall prevention and lower extremity muscle strengthening.  I will refer her to physical therapy.   PATIENT SURVEYS:  FOTO 41(49 predicted)   COGNITION:           Overall cognitive status: Within functional limits for tasks assessed                          SENSATION: Not tested   MUSCLE LENGTH: Hamstrings: Right 45 deg;   POSTURE:  Anterior PT   PALPATION: TTP R piriformis     LE ROM:   A/PROM Right 12/30/2021 Left 12/30/2021  Hip flexion      Hip extension      Hip abduction      Hip adduction      Hip internal rotation      Hip external rotation      Knee flexion      Knee extension      Ankle dorsiflexion      Ankle plantarflexion      Ankle inversion      Ankle eversion       (Blank rows = not tested)   LE MMT:   MMT Right 12/30/2021 Left 12/30/2021  Hip flexion 4 5  Hip extension 4 5  Hip abduction 4 5  Hip adduction      Hip internal rotation      Hip external rotation      Knee flexion      Knee extension 4 5  Ankle dorsiflexion 4 5  Ankle plantarflexion      Ankle inversion      Ankle eversion       (Blank rows = not tested)   LOWER EXTREMITY SPECIAL TESTS:  Hip special tests: Saralyn Pilar (FABER) test: negative, Thomas test: negative, and Piriformis test: positive    FUNCTIONAL TESTS:  5 times sit to stand: 12s   GAIT: Distance walked: 79ft x2 Assistive device utilized: None Level of assistance: Complete Independence         TODAY'S TREATMENT: [facility] Adult PT Treatment:                                                DATE: 01/21/22 Therapeutic Exercise: Nustep L2 8 min Seated table hamstring stretch 30s x3 Figure 4 R piriformis stretch 30s x3 Single leg bridge 15/15 SLR 15/15 Side lie clamshells 15/15 Side lie abduction with extension bias to target glute medius 15/15 Manual Therapy  Piriformis release R 8 min      PATIENT EDUCATION:  Education details: Discussed eval findings, rehab rationale and POC and patient is in agreement  Person educated: Patient Education method: Explanation, Demonstration, and Handouts Education comprehension: verbalized understanding and needs further education     HOME EXERCISE PROGRAM: Access Code:  [email or web address] URL: [email or web address] Date: 01/21/2022 Prepared by: Sharlynn Oliphant  Exercises - Supine Figure 4 Piriformis Stretch  - 2 x daily - 7 x weekly - 1 sets - 3 reps  - 30s hold - Clamshell  - 2 x daily - 7 x weekly - 1 sets - 15 reps - Seated Table Hamstring Stretch  - 2 x daily - 7 x weekly - 1 sets - 3 reps - 30 hold - Figure 4 Bridge  - 2 x daily - 7 x weekly - 1 sets - 15 reps   ASSESSMENT:   CLINICAL IMPRESSION: Todays session focused on HEP review, additional stretching and strengthening of R hip, core exercises.  Added to HEP, included R piriformis release.  Continues to show signs of piriformis syndrome as well as potential labral dysfunction      OBJECTIVE IMPAIRMENTS decreased activity tolerance, decreased knowledge of condition, decreased mobility, decreased ROM, decreased strength, increased muscle spasms, impaired flexibility, postural dysfunction, and pain.    ACTIVITY LIMITATIONS community activity, driving, occupation, and squatting .      REHAB POTENTIAL: Good   CLINICAL DECISION MAKING: Evolving/moderate complexity   EVALUATION COMPLEXITY: Moderate     GOALS: Goals reviewed with patient? Yes   SHORT TERM GOALS: Target date: 01/13/2022   Patient to demonstrate independence in HEP  Baseline:Access Code: [email or web address] Goal status: INITIAL   2.  Decrease R piriformis tenderness to 4/10 Baseline: 8/10 Goal status: INITIAL   LONG TERM GOALS: Target date: 01/27/2022   Decrease 5x STS time to 10s Baseline: 12s Goal status: INITIAL   2.  Increase R LE abd, flexion and extension to 4+/5 Baseline:  Goal status: INITIAL   3.  Increase FOTO score to 49 Baseline: 41 Goal status: INITIAL         PLAN: PT FREQUENCY: 2x/week   PT DURATION: 4 weeks   PLANNED INTERVENTIONS: Therapeutic exercises, Therapeutic activity, Neuromuscular re-education, Balance training, Gait training, Patient/Family education, Joint mobilization, Stair training, and Manual therapy   PLAN FOR NEXT SESSION: HEP review and update, aerobic work, R hip strength and stretch, R LE strength and stretch, piriformis release R    Lanice Shirts,  PT 01/21/2022, 4:15 PM

## 2022-02-05 ENCOUNTER — Telehealth: Payer: Self-pay

## 2022-02-11 ENCOUNTER — Ambulatory Visit: Payer: Medicaid Other | Admitting: Cardiology

## 2022-02-16 ENCOUNTER — Ambulatory Visit (HOSPITAL_BASED_OUTPATIENT_CLINIC_OR_DEPARTMENT_OTHER)
Admission: RE | Admit: 2022-02-16 | Discharge: 2022-02-16 | Disposition: A | Discharge status: Home / Self Care | Payer: Commercial Managed Care - HMO | Source: Ambulatory Visit | Attending: Rheumatology | Admitting: Rheumatology

## 2022-02-16 DIAGNOSIS — Z13820 Encounter for screening for osteoporosis: Secondary | ICD-10-CM | POA: Insufficient documentation

## 2022-02-16 DIAGNOSIS — Z780 Asymptomatic menopausal state: Secondary | ICD-10-CM | POA: Diagnosis not present

## 2022-02-16 NOTE — Progress Notes (Signed)
DEXA scan is within normal limits.

## 2022-02-24 ENCOUNTER — Ambulatory Visit: Payer: Commercial Managed Care - HMO | Attending: Rheumatology

## 2022-02-24 DIAGNOSIS — M6281 Muscle weakness (generalized): Secondary | ICD-10-CM | POA: Insufficient documentation

## 2022-02-24 DIAGNOSIS — R2681 Unsteadiness on feet: Secondary | ICD-10-CM | POA: Diagnosis present

## 2022-02-24 DIAGNOSIS — M7061 Trochanteric bursitis, right hip: Secondary | ICD-10-CM | POA: Insufficient documentation

## 2022-02-24 NOTE — Therapy (Signed)
OUTPATIENT PHYSICAL THERAPY TREATMENT NOTE   Patient Name: [name] MRN: [id number] DOB:1971/06/18, 51 y.o., female Today's Date: 02/24/2022  PCP: [name], MD REFERRING PROVIDER: Pollyann Savoy, MD  END OF SESSION:   PT End of Session - 02/24/22 1440     Visit Number 3    Number of Visits 8    Date for PT Re-Evaluation 01/27/22    Authorization Type [employer]    PT Start Time 1445    PT Stop Time 1525    PT Time Calculation (min) 40 min    Activity Tolerance Patient tolerated treatment well    Behavior During Therapy [facility] for tasks assessed/performed             Past Medical History:  Diagnosis Date   Anxiety    Arthritis    fingers   Cervical dysplasia    Coronary artery disease    Depression    Diabetes mellitus    Type II   Dyspnea 11/22/2013   GERD (gastroesophageal reflux disease)    High cholesterol    History of kidney stones    passed   Hypertension    no meds now   Kidney stones    Moderate episode of recurrent major depressive disorder (HCC) 12/28/2019   Neuropathy    feet and legs   Nuclear sclerotic cataract of both eyes 09/2019   Dr. Shea Evans   PONV (postoperative nausea and vomiting)    one time   Sleep apnea    lost 70lbs no cpap x10yrs now   Stage 3a chronic kidney disease (HCC) 04/29/2020   Past Surgical History:  Procedure Laterality Date   CARDIAC CATHETERIZATION     4-51yrs ago   CERVICAL CONIZATION W/BX N/A 05/04/2018   Procedure: CONIZATION CERVIX WITH BIOPSY - COLD KNIFE;  Surgeon: Hermina Staggers, MD;  Location: [facility];  Service: Gynecology;  Laterality: N/A;   CHOLECYSTECTOMY     CORONARY ARTERY BYPASS GRAFT N/A 10/24/2019   Procedure: CORONARY ARTERY BYPASS GRAFTING (CABG) times four, using left radial artery harvested endoscopically and right greater saphenous vein harvested endoscopically.;  Surgeon: Kerin Perna, MD;  Location: [facility] OR;  Service: Open Heart Surgery;  Laterality: N/A;    ELBOW FRACTURE SURGERY Left    ELBOW SURGERY Right    ENDOMETRIAL ABLATION     6 years ago   INCISION AND DRAINAGE     for boil- buttocks   INCISION AND DRAINAGE Left    Leg, Nephrotoxin fasciotimy   IRRIGATION AND DEBRIDEMENT ABSCESS Right 03/17/2017   Procedure: IRRIGATION AND DEBRIDEMENT RIGHT THIGH ABSCESS;  Surgeon: Karie Soda, MD;  Location: [facility]S;  Service: General;  Laterality: Right;   LAPAROSCOPIC APPENDECTOMY  10/04/2011   Procedure: APPENDECTOMY LAPAROSCOPIC;  Surgeon: Rulon Abide, DO;  Location: [facility];  Service: General;  Laterality: N/A;   LEFT HEART CATH AND CORONARY ANGIOGRAPHY N/A 10/06/2019   Procedure: LEFT HEART CATH AND CORONARY ANGIOGRAPHY;  Surgeon: Swaziland, Peter M, MD;  Location: [facility];  Service: Cardiovascular;  Laterality: N/A;   RADIAL ARTERY HARVEST Left 10/24/2019   Procedure: Radial Artery Harvest;  Surgeon: Kerin Perna, MD;  Location: [facility] OR;  Service: Open Heart Surgery;  Laterality: Left;   TEE WITHOUT CARDIOVERSION N/A 10/24/2019   Procedure: TRANSESOPHAGEAL ECHOCARDIOGRAM (TEE);  Surgeon: Donata Clay, Theron Arista, MD;  Location: [facility] OR;  Service: Open Heart Surgery;  Laterality: N/A;   TENDON RECONSTRUCTION Right 07/02/2021   Procedure: Right  wrist 6 dorsal compartment tenosynovectomy and tendon repair Right elbow lateral epicondylar debridement and tendon repair;  Surgeon: Bradly Bienenstock, MD;  Location: [facility] OR;  Service: Orthopedics;  Laterality: Right;   WRIST FRACTURE SURGERY Left    WRIST SURGERY Right    Patient Active Problem List   Diagnosis Date Noted   Polyneuropathy associated with underlying disease (HCC) 07/30/2021   Dehydration 12/27/2020   Type 2 diabetes mellitus with hyperglycemia, with long-term current use of insulin (HCC) 11/11/2020   Stage 3a chronic kidney disease (HCC) 04/29/2020   COVID-19 virus vaccination declined 04/29/2020   Influenza vaccination declined 04/29/2020   Midline sternotomy scar 04/17/2020    Left rib fracture 03/13/2020   Moderate episode of recurrent major depressive disorder (HCC) 12/28/2019   Encounter for post surgical wound check 12/06/2019   Type 2 diabetes mellitus with diabetic polyneuropathy, with long-term current use of insulin (HCC) 11/16/2019   Diabetes mellitus (HCC) 11/16/2019   Type 2 diabetes mellitus with retinopathy, with long-term current use of insulin (HCC) 11/16/2019   Dyslipidemia 11/16/2019   Postop check 11/15/2019   Diabetic retinopathy (HCC) 10/25/2019   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   Angina pectoris (HCC) 10/06/2019   Left-sided chest pain 06/13/2019   Post-operative state 06/06/2018   HGSIL (high grade squamous intraepithelial lesion) on Pap smear of cervix 02/25/2018   Right upper quadrant abdominal pain 12/30/2017   Polyarthritis 12/30/2017   OSA (obstructive sleep apnea) 11/22/2013   Dyspnea 11/22/2013   Uncontrolled type 2 diabetes mellitus with hyperosmolar nonketotic hyperglycemia (HCC) 07/21/2012   HTN (hypertension) 07/21/2012   Smoker 07/21/2012    REFERRING DIAG: M70.61,M70.62 (ICD-10-CM) - Trochanteric bursitis of both hips R29.6 (ICD-10-CM) - Frequent falls   THERAPY DIAG: Trochanteric bursitis of R hip, frequent falls  Rationale for Evaluation and Treatment Rehabilitation  PERTINENT HISTORY: History of Present Illness: [name] is a 51 y.o. female with history of osteoarthritis.  She gradually recovered from the fall 2022.  She fractured her left wrist and left elbow.  She also had surgery on her right wrist and right elbow due to torn tendon.  She states she went through physical therapy which was helpful.  She continues to have intermittent discomfort in her hands.  She reports intermittent swelling in her wrist and hands.  She also gets fluid retention in her lower extremities.  She has been experiencing pain in her right hip which she describes over the trochanteric area.  The left knee joint and left shoulder joint pain  resolved.  She has had frequent falls in the past.  She has never had physical therapy for fall prevention and lower extremity muscle strengthening.  She has been going to the gym for the last 2 months.  She has been doing aerobic exercises on the elliptical and treadmill.  PRECAUTIONS: fall  SUBJECTIVE: Returns to PT following a 30 day absence.  Has been seen by ortho and MRI revealed a torn/damaged TFL.  Was issued M. relaxers  and instructed to resume OPPT and f/u in 30 days if no improvement.  PAIN:  Are you having pain? Yes: NPRS scale: 9/10 Pain location: R hip  Pain description: ache Aggravating factors: pressure Relieving factors: position change   OBJECTIVE: (objective measures completed at initial evaluation unless otherwise dated)   OBJECTIVE:    DIAGNOSTIC FINDINGS: Visit Diagnoses: Primary osteoarthritis of both hands-she complains of pain and discomfort in her bilateral hands.  She had bilateral PIP and DIP thickening.  An exercise  was emphasized.   Trochanteric bursitis of both hips-she has been having tenderness in her trochanteric area intermittently.  Her right trochanteric bursa was more tender.  She also had tenderness over her anterior superior iliac spine.  I will refer her to physical therapy.   Chronic pain of left knee -the pain in her left knee resolved.  X-rays obtained were unremarkable.   Primary osteoarthritis of both feet - X-rays were consistent with osteoarthritis. All autoimmune work-up was negative.   Frequent falls-patient had a fall in May 2022.  She has had several falls before.  She has not had physical therapy for fall prevention and lower extremity muscle strengthening.  I will refer her to physical therapy.   PATIENT SURVEYS:  FOTO 41(49 predicted)   COGNITION:           Overall cognitive status: Within functional limits for tasks assessed                          SENSATION: Not tested   MUSCLE LENGTH: Hamstrings: Right 45 deg;    POSTURE:  Anterior PT   PALPATION: TTP R piriformis    LE ROM:   A/PROM Right 12/30/2021 Left 12/30/2021 R 02/24/22  Hip flexion     120d P  Hip extension     10d P  Hip abduction       Hip adduction       Hip internal rotation       Hip external rotation       Knee flexion       Knee extension       Ankle dorsiflexion       Ankle plantarflexion       Ankle inversion       Ankle eversion        (Blank rows = not tested)   LE MMT:   MMT Right 12/30/2021 Left 12/30/2021 R 02/24/22  Hip flexion 4 5 4   Hip extension 4 5 4   Hip abduction 4 5 4   Hip adduction       Hip internal rotation       Hip external rotation       Knee flexion       Knee extension 4 5 4   Ankle dorsiflexion 4 5 4   Ankle plantarflexion       Ankle inversion       Ankle eversion        (Blank rows = not tested)   LOWER EXTREMITY SPECIAL TESTS:  Hip special tests: Luisa Hart (FABER) test: negative, Thomas test: negative, and Piriformis test: positive  02/24/22 positive FABERS and FADIRS   FUNCTIONAL TESTS:  5 times sit to stand: 12s; 02/24/22 28s   GAIT: Distance walked: 25ft x2 Assistive device utilized: None Level of assistance: Complete Independence         TODAY'S TREATMENT: [facility] Adult PT Treatment:                                                DATE: 02/24/22  Therapeutic Activity: 5x STS arms crossed 28s Re-assessment of ROM, strength and progress towards goals  [facility] Adult PT Treatment:  DATE: 01/21/22 Therapeutic Exercise: Nustep L2 8 min Seated table hamstring stretch 30s x3 Figure 4 R piriformis stretch 30s x3 Single leg bridge 15/15 SLR 15/15 Side lie clamshells 15/15 Side lie abduction with extension bias to target glute medius 15/15 Manual Therapy  Piriformis release R 8 min      PATIENT EDUCATION:  Education details: Discussed eval findings, rehab rationale and POC and patient is in agreement  Person educated:  Patient Education method: Explanation, Demonstration, and Handouts Education comprehension: verbalized understanding and needs further education     HOME EXERCISE PROGRAM: Access Code: [email or web address] URL: [email or web address] Date: 01/21/2022 Prepared by: Gustavus Bryant  Exercises - Supine Figure 4 Piriformis Stretch  - 2 x daily - 7 x weekly - 1 sets - 3 reps - 30s hold - Clamshell  - 2 x daily - 7 x weekly - 1 sets - 15 reps - Seated Table Hamstring Stretch  - 2 x daily - 7 x weekly - 1 sets - 3 reps - 30 hold - Figure 4 Bridge  - 2 x daily - 7 x weekly - 1 sets - 15 reps   ASSESSMENT:   CLINICAL IMPRESSION: Patient returns to PT with increased symptoms as well as new Dx of TFL tear R.  Pain levels have increased, 5x STS time has increased and she continues to remain point tender through R piriformis and greater trochanteric regions. FOTO score is unchanged.  OBJECTIVE IMPAIRMENTS decreased activity tolerance, decreased knowledge of condition, decreased mobility, decreased ROM, decreased strength, increased muscle spasms, impaired flexibility, postural dysfunction, and pain.    ACTIVITY LIMITATIONS community activity, driving, occupation, and squatting .      REHAB POTENTIAL: Good   CLINICAL DECISION MAKING: Evolving/moderate complexity   EVALUATION COMPLEXITY: Moderate     GOALS: Goals reviewed with patient? Yes   SHORT TERM GOALS: Target date: 01/13/2022   Patient to demonstrate independence in HEP  Baseline:Access Code: [email or web address] Goal status: Semi compliant due to pain, ongoing   2.  Decrease R piriformis tenderness to 4/10 Baseline: 8/10; 02/24/22 Pain/tenderness 9/10 at worst Goal status: Ongoing   LONG TERM GOALS: Target date: 01/27/2022   Decrease 5x STS time to 10s Baseline: 12s; 02/24/22 28s arms crossed Goal status: Regressing, ongoing   2.  Increase R LE abd, flexion and extension to 4+/5 Baseline: 4/5; 02/24/22 Unchanged Goal status: Ongoing    3.  Increase FOTO score to 49 Baseline: 41; 02/24/22 41 Goal status: Ongoing         PLAN: PT FREQUENCY: 2x/week   PT DURATION: 4 weeks   PLANNED INTERVENTIONS: Therapeutic exercises, Therapeutic activity, Neuromuscular re-education, Balance training, Gait training, Patient/Family education, Joint mobilization, Stair training, aquatics and Manual therapy   PLAN FOR NEXT SESSION: HEP review and update, aerobic work, R hip strength and stretch, R LE strength and stretch, schedule aquatics    Hildred Laser, PT 02/24/2022, 2:41 PM

## 2022-02-26 ENCOUNTER — Other Ambulatory Visit: Payer: Self-pay | Admitting: Internal Medicine

## 2022-02-26 ENCOUNTER — Ambulatory Visit: Payer: Commercial Managed Care - HMO

## 2022-02-26 ENCOUNTER — Other Ambulatory Visit: Payer: Self-pay | Admitting: Cardiology

## 2022-02-26 DIAGNOSIS — R2681 Unsteadiness on feet: Secondary | ICD-10-CM

## 2022-02-26 DIAGNOSIS — M7061 Trochanteric bursitis, right hip: Secondary | ICD-10-CM

## 2022-02-26 DIAGNOSIS — M6281 Muscle weakness (generalized): Secondary | ICD-10-CM

## 2022-02-26 NOTE — Therapy (Signed)
OUTPATIENT PHYSICAL THERAPY TREATMENT NOTE   Patient Name: [name] MRN: [id number] DOB:01/19/1971, 51 y.o., female Today's Date: 02/26/2022  PCP: [name], MD REFERRING PROVIDER: Pollyann Savoy, MD  END OF SESSION:   PT End of Session - 02/26/22 1611     Visit Number 4    Number of Visits 8    Date for PT Re-Evaluation 01/27/22    Authorization Type [employer]    PT Start Time 1615    PT Stop Time 1655    PT Time Calculation (min) 40 min    Activity Tolerance Patient tolerated treatment well    Behavior During Therapy [facility] for tasks assessed/performed             Past Medical History:  Diagnosis Date   Anxiety    Arthritis    fingers   Cervical dysplasia    Coronary artery disease    Depression    Diabetes mellitus    Type II   Dyspnea 11/22/2013   GERD (gastroesophageal reflux disease)    High cholesterol    History of kidney stones    passed   Hypertension    no meds now   Kidney stones    Moderate episode of recurrent major depressive disorder (HCC) 12/28/2019   Neuropathy    feet and legs   Nuclear sclerotic cataract of both eyes 09/2019   Dr. Shea Evans   PONV (postoperative nausea and vomiting)    one time   Sleep apnea    lost 70lbs no cpap x64yrs now   Stage 3a chronic kidney disease (HCC) 04/29/2020   Past Surgical History:  Procedure Laterality Date   CARDIAC CATHETERIZATION     4-35yrs ago   CERVICAL CONIZATION W/BX N/A 05/04/2018   Procedure: CONIZATION CERVIX WITH BIOPSY - COLD KNIFE;  Surgeon: Hermina Staggers, MD;  Location: [facility];  Service: Gynecology;  Laterality: N/A;   CHOLECYSTECTOMY     CORONARY ARTERY BYPASS GRAFT N/A 10/24/2019   Procedure: CORONARY ARTERY BYPASS GRAFTING (CABG) times four, using left radial artery harvested endoscopically and right greater saphenous vein harvested endoscopically.;  Surgeon: Kerin Perna, MD;  Location: [facility] OR;  Service: Open Heart Surgery;  Laterality: N/A;    ELBOW FRACTURE SURGERY Left    ELBOW SURGERY Right    ENDOMETRIAL ABLATION     6 years ago   INCISION AND DRAINAGE     for boil- buttocks   INCISION AND DRAINAGE Left    Leg, Nephrotoxin fasciotimy   IRRIGATION AND DEBRIDEMENT ABSCESS Right 03/17/2017   Procedure: IRRIGATION AND DEBRIDEMENT RIGHT THIGH ABSCESS;  Surgeon: Karie Soda, MD;  Location: [facility];  Service: General;  Laterality: Right;   LAPAROSCOPIC APPENDECTOMY  10/04/2011   Procedure: APPENDECTOMY LAPAROSCOPIC;  Surgeon: Rulon Abide, DO;  Location: [facility];  Service: General;  Laterality: N/A;   LEFT HEART CATH AND CORONARY ANGIOGRAPHY N/A 10/06/2019   Procedure: LEFT HEART CATH AND CORONARY ANGIOGRAPHY;  Surgeon: Swazil[place], Peter M, MD;  Location: [facility];  Service: Cardiovascular;  Laterality: N/A;   RADIAL ARTERY HARVEST Left 10/24/2019   Procedure: Radial Artery Harvest;  Surgeon: Kerin Perna, MD;  Location: [facility] OR;  Service: Open Heart Surgery;  Laterality: Left;   TEE WITHOUT CARDIOVERSION N/A 10/24/2019   Procedure: TRANSESOPHAGEAL ECHOCARDIOGRAM (TEE);  Surgeon: Donata Clay, Theron Arista, MD;  Location: [facility] OR;  Service: Open Heart Surgery;  Laterality: N/A;   TENDON RECONSTRUCTION Right 07/02/2021   Procedure: Right  wrist 6 dorsal compartment tenosynovectomy and tendon repair Right elbow lateral epicondylar debridement and tendon repair;  Surgeon: Iran Planas, MD;  Location: [place];  Service: Orthopedics;  Laterality: Right;   WRIST FRACTURE SURGERY Left    WRIST SURGERY Right    Patient Active Problem List   Diagnosis Date Noted   Polyneuropathy associated with underlying disease ([place]) 07/30/2021   Dehydration 12/27/2020   Type 2 diabetes mellitus with hyperglycemia, with long-term current use of insulin ([place]) 11/11/2020   Stage 3a chronic kidney disease ([place]) 04/29/2020   COVID-19 virus vaccination declined 04/29/2020   Influenza vaccination declined 04/29/2020   Midline sternotomy scar 04/17/2020    Left rib fracture 03/13/2020   Moderate episode of recurrent major depressive disorder ([place]) 12/28/2019   Encounter for post surgical wound check 12/06/2019   Type 2 diabetes mellitus with diabetic polyneuropathy, with long-term current use of insulin ([place]) 11/16/2019   Diabetes mellitus ([place]) 11/16/2019   Type 2 diabetes mellitus with retinopathy, with long-term current use of insulin ([place]) 11/16/2019   Dyslipidemia 11/16/2019   Postop check 11/15/2019   Diabetic retinopathy ([place]) 10/25/2019   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   Angina pectoris ([place]) 10/06/2019   Left-sided chest pain 06/13/2019   Post-operative state 06/06/2018   HGSIL (high grade squamous intraepithelial lesion) on Pap smear of cervix 02/25/2018   Right upper quadrant abdominal pain 12/30/2017   Polyarthritis 12/30/2017   OSA (obstructive sleep apnea) 11/22/2013   Dyspnea 11/22/2013   Uncontrolled type 2 diabetes mellitus with hyperosmolar nonketotic hyperglycemia ([place]) 07/21/2012   HTN (hypertension) 07/21/2012   Smoker 07/21/2012    REFERRING DIAG: M70.61,M70.62 (ICD-10-CM) - Trochanteric bursitis of both hips R29.6 (ICD-10-CM) - Frequent falls   THERAPY DIAG: Trochanteric bursitis of R hip, frequent falls  Rationale for Evaluation and Treatment Rehabilitation  PERTINENT HISTORY: History of Present Illness: [name] is a 51 y.o. female with history of osteoarthritis.  She gradually recovered from the fall 2022.  She fractured her left wrist and left elbow.  She also had surgery on her right wrist and right elbow due to torn tendon.  She states she went through physical therapy which was helpful.  She continues to have intermittent discomfort in her hands.  She reports intermittent swelling in her wrist and hands.  She also gets fluid retention in her lower extremities.  She has been experiencing pain in her right hip which she describes over the trochanteric area.  The left knee joint and left shoulder joint pain  resolved.  She has had frequent falls in the past.  She has never had physical therapy for fall prevention and lower extremity muscle strengthening.  She has been going to the gym for the last 2 months.  She has been doing aerobic exercises on the elliptical and treadmill.  PRECAUTIONS: fall  SUBJECTIVE: Returns to PT following a 30 day absence.  Has been seen by ortho and MRI revealed a torn/damaged TFL.  Was issued M. relaxers  and instructed to resume OPPT and f/u in 30 days if no improvement.  PAIN:  Are you having pain? Yes: NPRS scale: 9/10 Pain location: R hip  Pain description: ache Aggravating factors: pressure Relieving factors: position change   OBJECTIVE: (objective measures completed at initial evaluation unless otherwise dated)   OBJECTIVE:    DIAGNOSTIC FINDINGS: Visit Diagnoses: Primary osteoarthritis of both hands-she complains of pain and discomfort in her bilateral hands.  She had bilateral PIP and DIP thickening.  An exercise  was emphasized.   Trochanteric bursitis of both hips-she has been having tenderness in her trochanteric area intermittently.  Her right trochanteric bursa was more tender.  She also had tenderness over her anterior superior iliac spine.  I will refer her to physical therapy.   Chronic pain of left knee -the pain in her left knee resolved.  X-rays obtained were unremarkable.   Primary osteoarthritis of both feet - X-rays were consistent with osteoarthritis. All autoimmune work-up was negative.   Frequent falls-patient had a fall in May 2022.  She has had several falls before.  She has not had physical therapy for fall prevention and lower extremity muscle strengthening.  I will refer her to physical therapy.   PATIENT SURVEYS:  FOTO 41(49 predicted)   COGNITION:           Overall cognitive status: Within functional limits for tasks assessed                          SENSATION: Not tested   MUSCLE LENGTH: Hamstrings: Right 45 deg;    POSTURE:  Anterior PT   PALPATION: TTP R piriformis    LE ROM:   A/PROM Right 12/30/2021 Left 12/30/2021 R 02/24/22  Hip flexion     120d P  Hip extension     10d P  Hip abduction       Hip adduction       Hip internal rotation       Hip external rotation       Knee flexion       Knee extension       Ankle dorsiflexion       Ankle plantarflexion       Ankle inversion       Ankle eversion        (Blank rows = not tested)   LE MMT:   MMT Right 12/30/2021 Left 12/30/2021 R 02/24/22  Hip flexion 4 5 4   Hip extension 4 5 4   Hip abduction 4 5 4   Hip adduction       Hip internal rotation       Hip external rotation       Knee flexion       Knee extension 4 5 4   Ankle dorsiflexion 4 5 4   Ankle plantarflexion       Ankle inversion       Ankle eversion        (Blank rows = not tested)   LOWER EXTREMITY SPECIAL TESTS:  Hip special tests: Saralyn Pilar (FABER) test: negative, Thomas test: negative, and Piriformis test: positive  02/24/22 positive FABERS and FADIRS   FUNCTIONAL TESTS:  5 times sit to stand: 12s; 02/24/22 28s   GAIT: Distance walked: 75ft x2 Assistive device utilized: None Level of assistance: Complete Independence         TODAY'S TREATMENT: [facility] Adult PT Treatment:                                                DATE: 02/26/22 Therapeutic Exercise: Nustep L1 6 min Heel slides over towel against red strap(leg press) 30x Supine hip fallouts YTB 15x2 Reverse R hip fallout YTB 15x2 Bridge 15x2 R clams R hip flexor stretch 30s x2 Curl ups 15x Supine march, alternating 15/15  [facility] Adult PT Treatment:  DATE: 02/24/22 Therapeutic Activity: 5x STS arms crossed 28s Re-assessment of ROM, strength and progress towards goals  [facility] Adult PT Treatment:                                                DATE: 01/21/22 Therapeutic Exercise: Nustep L2 8 min Seated table hamstring stretch 30s x3 Figure 4 R piriformis stretch  30s x3 Single leg bridge 15/15 SLR 15/15 Side lie clamshells 15/15 Side lie abduction with extension bias to target glute medius 15/15 Manual Therapy  Piriformis release R 8 min      PATIENT EDUCATION:  Education details: Discussed eval findings, rehab rationale and POC and patient is in agreement  Person educated: Patient Education method: Explanation, Demonstration, and Handouts Education comprehension: verbalized understanding and needs further education     HOME EXERCISE PROGRAM: Access Code: [email or web address] URL: [email or web address] Date: 01/21/2022 Prepared by: Gustavus Bryant  Exercises - Supine Figure 4 Piriformis Stretch  - 2 x daily - 7 x weekly - 1 sets - 3 reps - 30s hold - Clamshell  - 2 x daily - 7 x weekly - 1 sets - 15 reps - Seated Table Hamstring Stretch  - 2 x daily - 7 x weekly - 1 sets - 3 reps - 30 hold - Figure 4 Bridge  - 2 x daily - 7 x weekly - 1 sets - 15 reps   ASSESSMENT:   CLINICAL IMPRESSION: Todays focus was R hip strengthening offset strain on R TFL muscle.  Unable to perform SLR on R due to pain, all other exercises tolerated w/o symptoms.  Weakness noted throughout R hip when TFL engaged  OBJECTIVE IMPAIRMENTS decreased activity tolerance, decreased knowledge of condition, decreased mobility, decreased ROM, decreased strength, increased muscle spasms, impaired flexibility, postural dysfunction, and pain.    ACTIVITY LIMITATIONS community activity, driving, occupation, and squatting .      REHAB POTENTIAL: Good   CLINICAL DECISION MAKING: Evolving/moderate complexity   EVALUATION COMPLEXITY: Moderate     GOALS: Goals reviewed with patient? Yes   SHORT TERM GOALS: Target date: 01/13/2022   Patient to demonstrate independence in HEP  Baseline:Access Code: [email or web address] Goal status: Semi compliant due to pain, ongoing   2.  Decrease R piriformis tenderness to 4/10 Baseline: 8/10; 02/24/22 Pain/tenderness 9/10 at worst Goal status:  Ongoing   LONG TERM GOALS: Target date: 01/27/2022   Decrease 5x STS time to 10s Baseline: 12s; 02/24/22 28s arms crossed Goal status: Regressing, ongoing   2.  Increase R LE abd, flexion and extension to 4+/5 Baseline: 4/5; 02/24/22 Unchanged Goal status: Ongoing   3.  Increase FOTO score to 49 Baseline: 41; 02/24/22 41 Goal status: Ongoing         PLAN: PT FREQUENCY: 2x/week   PT DURATION: 4 weeks   PLANNED INTERVENTIONS: Therapeutic exercises, Therapeutic activity, Neuromuscular re-education, Balance training, Gait training, Patient/Family education, Joint mobilization, Stair training, aquatics and Manual therapy   PLAN FOR NEXT SESSION: HEP review and update, aerobic work, R hip strength and stretch, R LE strength and stretch, schedule aquatics    Hildred Laser, PT 02/26/2022, 5:07 PM

## 2022-02-26 NOTE — Progress Notes (Unsigned)
Office Visit Note  Patient: [name]             Date of Birth: [birth year]           MRN: [id number]             PCP: [name], MD Referring: [name], * Visit Date: 03/11/2022 Occupation: @GUAROCC @  Subjective:  Discuss DEXA results  History of Present Illness: [name] is a 51 y.o. female with history of osteoarthritis.   DEXA updated on 02/16/22: The BMD measured at DualFemur Neck Right is 0.982 g/cm2 with a T-score of -0.4. This patient is considered normal according to World Health Organization [facility]) criteria.  Activities of Daily Living:  Patient reports morning stiffness for *** {minute/hour:19697}.   Patient {ACTIONS;DENIES/REPORTS:21021675::"Denies"} nocturnal pain.  Difficulty dressing/grooming: {ACTIONS;DENIES/REPORTS:21021675::"Denies"} Difficulty climbing stairs: {ACTIONS;DENIES/REPORTS:21021675::"Denies"} Difficulty getting out of chair: {ACTIONS;DENIES/REPORTS:21021675::"Denies"} Difficulty using hands for taps, buttons, cutlery, and/or writing: {ACTIONS;DENIES/REPORTS:21021675::"Denies"}  No Rheumatology ROS completed.   PMFS History:  Patient Active Problem List   Diagnosis Date Noted   Polyneuropathy associated with underlying disease (HCC) 07/30/2021   Dehydration 12/27/2020   Type 2 diabetes mellitus with hyperglycemia, with long-term current use of insulin (HCC) 11/11/2020   Stage 3a chronic kidney disease (HCC) 04/29/2020   COVID-19 virus vaccination declined 04/29/2020   Influenza vaccination declined 04/29/2020   Midline sternotomy scar 04/17/2020   Left rib fracture 03/13/2020   Moderate episode of recurrent major depressive disorder (HCC) 12/28/2019   Encounter for post surgical wound check 12/06/2019   Type 2 diabetes mellitus with diabetic polyneuropathy, with long-term current use of insulin (HCC) 11/16/2019   Diabetes mellitus (HCC) 11/16/2019   Type 2 diabetes mellitus with retinopathy, with long-term  current use of insulin (HCC) 11/16/2019   Dyslipidemia 11/16/2019   Postop check 11/15/2019   Diabetic retinopathy (HCC) 10/25/2019   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   Angina pectoris (HCC) 10/06/2019   Left-sided chest pain 06/13/2019   Post-operative state 06/06/2018   HGSIL (high grade squamous intraepithelial lesion) on Pap smear of cervix 02/25/2018   Right upper quadrant abdominal pain 12/30/2017   Polyarthritis 12/30/2017   OSA (obstructive sleep apnea) 11/22/2013   Dyspnea 11/22/2013   Uncontrolled type 2 diabetes mellitus with hyperosmolar nonketotic hyperglycemia (HCC) 07/21/2012   HTN (hypertension) 07/21/2012   Smoker 07/21/2012    Past Medical History:  Diagnosis Date   Anxiety    Arthritis    fingers   Cervical dysplasia    Coronary artery disease    Depression    Diabetes mellitus    Type II   Dyspnea 11/22/2013   GERD (gastroesophageal reflux disease)    High cholesterol    History of kidney stones    passed   Hypertension    no meds now   Kidney stones    Moderate episode of recurrent major depressive disorder (HCC) 12/28/2019   Neuropathy    feet and legs   Nuclear sclerotic cataract of both eyes 09/2019   Dr. 10/2019   PONV (postoperative nausea and vomiting)    one time   Sleep apnea    lost 70lbs no cpap x22yrs now   Stage 3a chronic kidney disease (HCC) 04/29/2020    Family History  Problem Relation Age of Onset   Aneurysm Mother    Pancreatic cancer Mother    Heart attack Father    Stroke Father    Heart disease Sister    Breast cancer Paternal Grandmother  Congestive Heart Failure Sister    Osteoarthritis Sister    Osteoarthritis Sister    Past Surgical History:  Procedure Laterality Date   CARDIAC CATHETERIZATION     4-69yrs ago   CERVICAL CONIZATION W/BX N/A 05/04/2018   Procedure: CONIZATION CERVIX WITH BIOPSY - COLD KNIFE;  Surgeon: Hermina Staggers, MD;  Location: [facility];  Service: Gynecology;  Laterality: N/A;    CHOLECYSTECTOMY     CORONARY ARTERY BYPASS GRAFT N/A 10/24/2019   Procedure: CORONARY ARTERY BYPASS GRAFTING (CABG) times four, using left radial artery harvested endoscopically and right greater saphenous vein harvested endoscopically.;  Surgeon: Kerin Perna, MD;  Location: [facility] OR;  Service: Open Heart Surgery;  Laterality: N/A;   ELBOW FRACTURE SURGERY Left    ELBOW SURGERY Right    ENDOMETRIAL ABLATION     6 years ago   INCISION AND DRAINAGE     for boil- buttocks   INCISION AND DRAINAGE Left    Leg, Nephrotoxin fasciotimy   IRRIGATION AND DEBRIDEMENT ABSCESS Right 03/17/2017   Procedure: IRRIGATION AND DEBRIDEMENT RIGHT THIGH ABSCESS;  Surgeon: Karie Soda, MD;  Location: [facility];  Service: General;  Laterality: Right;   LAPAROSCOPIC APPENDECTOMY  10/04/2011   Procedure: APPENDECTOMY LAPAROSCOPIC;  Surgeon: Rulon Abide, DO;  Location: [facility];  Service: General;  Laterality: N/A;   LEFT HEART CATH AND CORONARY ANGIOGRAPHY N/A 10/06/2019   Procedure: LEFT HEART CATH AND CORONARY ANGIOGRAPHY;  Surgeon: Swaziland, Peter M, MD;  Location: [facility];  Service: Cardiovascular;  Laterality: N/A;   RADIAL ARTERY HARVEST Left 10/24/2019   Procedure: Radial Artery Harvest;  Surgeon: Kerin Perna, MD;  Location: [facility] OR;  Service: Open Heart Surgery;  Laterality: Left;   TEE WITHOUT CARDIOVERSION N/A 10/24/2019   Procedure: TRANSESOPHAGEAL ECHOCARDIOGRAM (TEE);  Surgeon: Donata Clay, Theron Arista, MD;  Location: [facility] OR;  Service: Open Heart Surgery;  Laterality: N/A;   TENDON RECONSTRUCTION Right 07/02/2021   Procedure: Right wrist 6 dorsal compartment tenosynovectomy and tendon repair Right elbow lateral epicondylar debridement and tendon repair;  Surgeon: Bradly Bienenstock, MD;  Location: [facility] OR;  Service: Orthopedics;  Laterality: Right;   WRIST FRACTURE SURGERY Left    WRIST SURGERY Right    Social History   Social History Narrative   Not on file   Immunization History  Administered  Date(s) Administered   Influenza Split 05/31/2013   Influenza,inj,Quad PF,6+ Mos 06/05/2019   Pneumococcal Polysaccharide-23 03/29/2017   Tdap 01/27/2018     Objective: Vital Signs: LMP  (LMP Unknown)    Physical Exam Vitals and nursing note reviewed.  Constitutional:      Appearance: She is well-developed.  HENT:     Head: Normocephalic and atraumatic.  Eyes:     Conjunctiva/sclera: Conjunctivae normal.  Cardiovascular:     Rate and Rhythm: Normal rate and regular rhythm.     Heart sounds: Normal heart sounds.  Pulmonary:     Effort: Pulmonary effort is normal.     Breath sounds: Normal breath sounds.  Abdominal:     General: Bowel sounds are normal.     Palpations: Abdomen is soft.  Musculoskeletal:     Cervical back: Normal range of motion.  Skin:    General: Skin is warm and dry.     Capillary Refill: Capillary refill takes less than 2 seconds.  Neurological:     Mental Status: She is alert and oriented to person, place, and time.  Psychiatric:  Behavior: Behavior normal.      Musculoskeletal Exam: ***  CDAI Exam: CDAI Score: -- Patient Global: --; Provider Global: -- Swollen: --; Tender: -- Joint Exam 03/11/2022   No joint exam has been documented for this visit   There is currently no information documented on the homunculus. Go to the Rheumatology activity and complete the homunculus joint exam.  Investigation: No additional findings.  Imaging: DG BONE DENSITY (DXA)  Result Date: 02/16/2022 EXAM: DUAL X-RAY ABSORPTIOMETRY (DXA) FOR BONE MINERAL DENSITY IMPRESSION: Referring Physician:  Bo Merino Your patient completed a bone mineral density test using GE Lunar iDXA system (analysis version: 16). Technologist: ALW PATIENT: Name: Berlynn, Padget Patient ID: [id number] Birth Date: Jun 10, 1971 Height: 68.5 in. Sex: Female Measured: 02/16/2022 Weight: 301.2 lbs. Indications: Caucasian, Diabetic (insulin), Estrogen Deficiency, History of Fracture  (Adult), Nexium, Post Menopausal, Prozac Fractures: Rib, Tib/Fib, Wrist Treatments: ASSESSMENT: The BMD measured at DualFemur Neck Right is 0.982 g/cm2 with a T-score of -0.4. This patient is considered normal according to [facility]) criteria. The scan quality is good. Site Region Measured Date Measured Age YA BMD Significant CHANGE T-score DualFemur Neck Right 02/16/2022    52.9         -0.4    0.982 g/cm2 AP Spine  L1-L4      02/16/2022    52.9         0.3     1.237 g/cm2 DualFemur Total Mean 1/16/2022    52.9         0.8     1.105 g/cm2 World Health Organization [facility]) criteria for post-menopausal, Caucasian Women: Normal       T-score at or above -1 SD Osteopenia   T-score between -1 and -2.5 SD Osteoporosis T-score at or below -2.5 SD RECOMMENDATION: 1. All patients should optimize calcium and vitamin D intake. 2. Consider FDA approved medical therapies in postmenopausal women and men aged 73 years and older, based on the following: a. A hip or vertebral (clinical or morphometric) fracture b. T-score = -2.5 at the femoral neck or spine after appropriate evaluation to exclude secondary causes c. Low bone mass (T-score between -1.0 and -2.5 at the femoral neck or spine) and a 10- year probability of a hip fracture = 3% or a 10 year probability of a major osteoporosis-related fracture = 20% based on the [place]-adapted WHO algorithm. 3. Clinician judgement and/or patient preference may indicate treatment for people with10-year fracture probabilities above or below these levels. FOLLOW-UP: Patients with diagnosis of osteoporosis or at high risk for fracture should have regular bone mineral density tests. For patients eligible for Medicare routine testing is allowed once every 2 years. The testing frequency can be increased to one year for patients who have rapidly progressing disease, those who are receiving or discontinuing medical therapy to restore bone mass, or have additional risk factors. I  have reviewed this study and agree with the findings. [facility] Electronically Signed   By: Franki Cabot M.D.   On: 02/16/2022 08:22    Recent Labs: Lab Results  Component Value Date   WBC 6.1 12/09/2021   HGB 12.6 12/09/2021   PLT 173 12/09/2021   NA 139 12/09/2021   K 4.4 12/09/2021   CL 107 12/09/2021   CO2 20 (L) 12/09/2021   GLUCOSE 197 (H) 12/09/2021   BUN 32 (H) 12/09/2021   CREATININE 1.47 (H) 12/09/2021   BILITOT 0.4 12/27/2020   ALKPHOS 88 12/27/2020   AST 17  12/27/2020   ALT 18 12/27/2020   PROT 6.4 (L) 12/27/2020   ALBUMIN 3.8 12/27/2020   CALCIUM 9.1 12/09/2021   GFRAA 51 (L) 10/18/2020    Speciality Comments: No specialty comments available.  Procedures:  No procedures performed Allergies: Sulfa antibiotics and Codeine   Assessment / Plan:     Visit Diagnoses: Primary osteoarthritis of both hands  Trochanteric bursitis of both hips  Chronic pain of left knee  Primary osteoarthritis of both feet  Frequent falls  Essential hypertension  Uncontrolled type 2 diabetes mellitus with hyperosmolar nonketotic hyperglycemia (HCC)  S/P CABG x 4  History of gastroesophageal reflux (GERD)  History of kidney stones  Dyslipidemia  Nuclear sclerotic cataract of both eyes  HGSIL (high grade squamous intraepithelial lesion) on Pap smear of cervix  Anxiety and depression  OSA (obstructive sleep apnea)  Former smoker  Orders: No orders of the defined types were placed in this encounter.  No orders of the defined types were placed in this encounter.   Face-to-face time spent with patient was *** minutes. Greater than 50% of time was spent in counseling and coordination of care.  Follow-Up Instructions: No follow-ups on file.   Ofilia Neas, PA-C  Note - This record has been created using Dragon software.  Chart creation errors have been sought, but may not always  have been located. Such creation errors do not reflect on  the  standard of medical care.

## 2022-03-10 NOTE — Therapy (Signed)
OUTPATIENT PHYSICAL THERAPY TREATMENT NOTE   Patient Name: [name] MRN: [id number] DOB:1971-04-25, 51 y.o., female Today's Date: 03/11/2022  PCP: Meghan Hale, MD REFERRING PROVIDER: Pollyann Savoy, MD  END OF SESSION:   PT End of Session - 03/11/22 1221     Visit Number 5    Number of Visits 8    Date for PT Re-Evaluation 01/27/22    Authorization Type [employer]    PT Start Time 1225    PT Stop Time 1314    PT Time Calculation (min) 49 min    Activity Tolerance Patient tolerated treatment well    Behavior During Therapy [facility] for tasks assessed/performed              Past Medical History:  Diagnosis Date   Anxiety    Arthritis    fingers   Cervical dysplasia    Coronary artery disease    Depression    Diabetes mellitus    Type II   Dyspnea 11/22/2013   GERD (gastroesophageal reflux disease)    High cholesterol    History of kidney stones    passed   Hypertension    no meds now   Kidney stones    Moderate episode of recurrent major depressive disorder (HCC) 12/28/2019   Neuropathy    feet and legs   Nuclear sclerotic cataract of both eyes 09/2019   Dr. Shea Evans   PONV (postoperative nausea and vomiting)    one time   Sleep apnea    lost 70lbs no cpap x60yrs now   Stage 3a chronic kidney disease (HCC) 04/29/2020   Past Surgical History:  Procedure Laterality Date   CARDIAC CATHETERIZATION     4-37yrs ago   CERVICAL CONIZATION W/BX N/A 05/04/2018   Procedure: CONIZATION CERVIX WITH BIOPSY - COLD KNIFE;  Surgeon: Hermina Staggers, MD;  Location: [facility];  Service: Gynecology;  Laterality: N/A;   CHOLECYSTECTOMY     CORONARY ARTERY BYPASS GRAFT N/A 10/24/2019   Procedure: CORONARY ARTERY BYPASS GRAFTING (CABG) times four, using left radial artery harvested endoscopically and right greater saphenous vein harvested endoscopically.;  Surgeon: Kerin Perna, MD;  Location: [facility] OR;  Service: Open Heart Surgery;  Laterality: N/A;    ELBOW FRACTURE SURGERY Left    ELBOW SURGERY Right    ENDOMETRIAL ABLATION     6 years ago   INCISION AND DRAINAGE     for boil- buttocks   INCISION AND DRAINAGE Left    Leg, Nephrotoxin fasciotimy   IRRIGATION AND DEBRIDEMENT ABSCESS Right 03/17/2017   Procedure: IRRIGATION AND DEBRIDEMENT RIGHT THIGH ABSCESS;  Surgeon: Karie Soda, MD;  Location: [facility];  Service: General;  Laterality: Right;   LAPAROSCOPIC APPENDECTOMY  10/04/2011   Procedure: APPENDECTOMY LAPAROSCOPIC;  Surgeon: Rulon Abide, DO;  Location: [facility];  Service: General;  Laterality: N/A;   LEFT HEART CATH AND CORONARY ANGIOGRAPHY N/A 10/06/2019   Procedure: LEFT HEART CATH AND CORONARY ANGIOGRAPHY;  Surgeon: Swaziland, Peter M, MD;  Location: [facility];  Service: Cardiovascular;  Laterality: N/A;   RADIAL ARTERY HARVEST Left 10/24/2019   Procedure: Radial Artery Harvest;  Surgeon: Kerin Perna, MD;  Location: [facility] OR;  Service: Open Heart Surgery;  Laterality: Left;   TEE WITHOUT CARDIOVERSION N/A 10/24/2019   Procedure: TRANSESOPHAGEAL ECHOCARDIOGRAM (TEE);  Surgeon: Donata Clay, Theron Arista, MD;  Location: [facility] OR;  Service: Open Heart Surgery;  Laterality: N/A;   TENDON RECONSTRUCTION Right 07/02/2021   Procedure:  Right wrist 6 dorsal compartment tenosynovectomy and tendon repair Right elbow lateral epicondylar debridement and tendon repair;  Surgeon: Bradly Bienenstock, MD;  Location: [facility] OR;  Service: Orthopedics;  Laterality: Right;   WRIST FRACTURE SURGERY Left    WRIST SURGERY Right    Patient Active Problem List   Diagnosis Date Noted   Polyneuropathy associated with underlying disease (HCC) 07/30/2021   Dehydration 12/27/2020   Type 2 diabetes mellitus with hyperglycemia, with long-term current use of insulin (HCC) 11/11/2020   Stage 3a chronic kidney disease (HCC) 04/29/2020   COVID-19 virus vaccination declined 04/29/2020   Influenza vaccination declined 04/29/2020   Midline sternotomy scar 04/17/2020    Left rib fracture 03/13/2020   Moderate episode of recurrent major depressive disorder (HCC) 12/28/2019   Encounter for post surgical wound check 12/06/2019   Type 2 diabetes mellitus with diabetic polyneuropathy, with long-term current use of insulin (HCC) 11/16/2019   Diabetes mellitus (HCC) 11/16/2019   Type 2 diabetes mellitus with retinopathy, with long-term current use of insulin (HCC) 11/16/2019   Dyslipidemia 11/16/2019   Postop check 11/15/2019   Diabetic retinopathy (HCC) 10/25/2019   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   Angina pectoris (HCC) 10/06/2019   Left-sided chest pain 06/13/2019   Post-operative state 06/06/2018   HGSIL (high grade squamous intraepithelial lesion) on Pap smear of cervix 02/25/2018   Right upper quadrant abdominal pain 12/30/2017   Polyarthritis 12/30/2017   OSA (obstructive sleep apnea) 11/22/2013   Dyspnea 11/22/2013   Uncontrolled type 2 diabetes mellitus with hyperosmolar nonketotic hyperglycemia (HCC) 07/21/2012   HTN (hypertension) 07/21/2012   Smoker 07/21/2012    REFERRING DIAG: M70.61,M70.62 (ICD-10-CM) - Trochanteric bursitis of both hips R29.6 (ICD-10-CM) - Frequent falls   THERAPY DIAG: Trochanteric bursitis of R hip, frequent falls  Rationale for Evaluation and Treatment Rehabilitation  PERTINENT HISTORY: History of Present Illness: [name] is a 51 y.o. female with history of osteoarthritis.  She gradually recovered from the fall 2022.  She fractured her left wrist and left elbow.  She also had surgery on her right wrist and right elbow due to torn tendon.  She states she went through physical therapy which was helpful.  She continues to have intermittent discomfort in her hands.  She reports intermittent swelling in her wrist and hands.  She also gets fluid retention in her lower extremities.  She has been experiencing pain in her right hip which she describes over the trochanteric area.  The left knee joint and left shoulder joint pain  resolved.  She has had frequent falls in the past.  She has never had physical therapy for fall prevention and lower extremity muscle strengthening.  She has been going to the gym for the last 2 months.  She has been doing aerobic exercises on the elliptical and treadmill.  PRECAUTIONS: fall  SUBJECTIVE: Pt enters aquatics pool with 0/10 pain.    PAIN:  Are you having pain? Yes: NPRS scale: 0/10 Pain location: R hip  Pain description: ache Aggravating factors: pressure Relieving factors: position change   OBJECTIVE: (objective measures completed at initial evaluation unless otherwise dated)   OBJECTIVE:    DIAGNOSTIC FINDINGS: Visit Diagnoses: Primary osteoarthritis of both hands-she complains of pain and discomfort in her bilateral hands.  She had bilateral PIP and DIP thickening.  An exercise was emphasized.   Trochanteric bursitis of both hips-she has been having tenderness in her trochanteric area intermittently.  Her right trochanteric bursa was more tender.  She also  had tenderness over her anterior superior iliac spine.  I will refer her to physical therapy.   Chronic pain of left knee -the pain in her left knee resolved.  X-rays obtained were unremarkable.   Primary osteoarthritis of both feet - X-rays were consistent with osteoarthritis. All autoimmune work-up was negative.   Frequent falls-patient had a fall in May 2022.  She has had several falls before.  She has not had physical therapy for fall prevention and lower extremity muscle strengthening.  I will refer her to physical therapy.   PATIENT SURVEYS:  FOTO 41(49 predicted)   COGNITION:           Overall cognitive status: Within functional limits for tasks assessed                          SENSATION: Not tested   MUSCLE LENGTH: Hamstrings: Right 45 deg;   POSTURE:  Anterior PT   PALPATION: TTP R piriformis    LE ROM:   A/PROM Right 12/30/2021 Left 12/30/2021 R 02/24/22  Hip flexion     120d P  Hip  extension     10d P  Hip abduction       Hip adduction       Hip internal rotation       Hip external rotation       Knee flexion       Knee extension       Ankle dorsiflexion       Ankle plantarflexion       Ankle inversion       Ankle eversion        (Blank rows = not tested)   LE MMT:   MMT Right 12/30/2021 Left 12/30/2021 R 02/24/22  Hip flexion 4 5 4   Hip extension 4 5 4   Hip abduction 4 5 4   Hip adduction       Hip internal rotation       Hip external rotation       Knee flexion       Knee extension 4 5 4   Ankle dorsiflexion 4 5 4   Ankle plantarflexion       Ankle inversion       Ankle eversion        (Blank rows = not tested)   LOWER EXTREMITY SPECIAL TESTS:  Hip special tests: Saralyn Pilar (FABER) test: negative, Thomas test: negative, and Piriformis test: positive  02/24/22 positive FABERS and FADIRS   FUNCTIONAL TESTS:  5 times sit to stand: 12s; 02/24/22 28s   GAIT: Distance walked: 59ft x2 Assistive device utilized: None Level of assistance: Complete Independence         TODAY'S TREATMENT: [facility] Adult PT Treatment:                                                DATE: 03-11-22 Aquatic therapy at [address] - therapeutic pool temp 92 degrees Treatment took place in water 3.8 to  4 ft 8 in.feet deep depending upon activity.  PT enters  without AD and independently. Pt entered and exited the pool holding onto hand rail. Pt pain level 0/10 at initiation of water walking.   Aquatic Exercise  Pt educated on neutral posture and hip hinging in seated position with water at chest level x 10 with  stretch to low back/abdominal engagement and then x 10 with back at pool wall at external cue, VC for neck tucked to prevent hyperextension. Walking forward/sidestepping  and backward , Pt with decreased balance in water walking backwards SLS and kicking forward ,side and backward for balance  on R and then L Marching hip extension with knee flexion BIL Hip  abd/add BIL Hip ext/ knee flex with knee straight  Hip IR/ER with hip flex 90 degrees Submerged step 10 x on R and L  without UE support Runners stretch x30" BIL Hamstring stretch x30" BIL Using yellow DB and bicycle and scissor  in deeper water Squats 2 x 20 reps Lunge with target 10 x on R and then L Supine abdominal hollowing with VC to keep hips up and feet up with  yellow barbells in UE submerged Plank wit yellow DB and hip abd/posterior chain Side to side tuck and extension for core work Pt with soreness behind greater trochanter on R . Received Aqua stretch in same area with decreased tissue tension post RX.  Pt is " sore" but feels fine post Aquatics  Pt requires the buoyancy of water for active assisted exercises with buoyancy supported for strengthening and AROM exercises. Hydrostatic pressure also supports joints by unweighting joint load by at least 50 % in 3-4 feet depth water. 80% in chest to neck deep water. Water will provide assistance with movement using the current and laminar flow while the buoyancy reduces weight bearing. Pt requires the viscosity of the water for resistance with strengthening exercises   [facility] Adult PT Treatment:                                                DATE: 02/26/22 Therapeutic Exercise: Nustep L1 6 min Heel slides over towel against red strap(leg press) 30x Supine hip fallouts YTB 15x2 Reverse R hip fallout YTB 15x2 Bridge 15x2 R clams R hip flexor stretch 30s x2 Curl ups 15x Supine march, alternating 15/15  [facility] Adult PT Treatment:                                                DATE: 02/24/22 Therapeutic Activity: 5x STS arms crossed 28s Re-assessment of ROM, strength and progress towards goals  [facility] Adult PT Treatment:                                                DATE: 01/21/22 Therapeutic Exercise: Nustep L2 8 min Seated table hamstring stretch 30s x3 Figure 4 R piriformis stretch 30s x3 Single leg bridge 15/15 SLR 15/15 Side  lie clamshells 15/15 Side lie abduction with extension bias to target glute medius 15/15 Manual Therapy  Piriformis release R 8 min      PATIENT EDUCATION:  Education details: Discussed eval findings, rehab rationale and POC and patient is in agreement  Person educated: Patient Education method: Explanation, Demonstration, and Handouts Education comprehension: verbalized understanding and needs further education     HOME EXERCISE PROGRAM: Access Code: [email or web address] URL: [email or web address] Date: 01/21/2022 Prepared by: Sharlynn Oliphant  Exercises - Supine Figure  4 Piriformis Stretch  - 2 x daily - 7 x weekly - 1 sets - 3 reps - 30s hold - Clamshell  - 2 x daily - 7 x weekly - 1 sets - 15 reps - Seated Table Hamstring Stretch  - 2 x daily - 7 x weekly - 1 sets - 3 reps - 30 hold - Figure 4 Bridge  - 2 x daily - 7 x weekly - 1 sets - 15 reps   ASSESSMENT:   CLINICAL IMPRESSION: Ms Nigro enters pool area for first time for aquatics. Pt is 0/10 before entering pool.  Pt is introduced to therapeutic effects of water and works/exercises continuously for 40 minutes.  Pt with R hip/greater troch soreness at end of session but received Aqua stretch with decreased tissue tension at end of session.  Pt able to perform Hip ext/flex/  abd/add in water with no adverse effects today.  OBJECTIVE IMPAIRMENTS decreased activity tolerance, decreased knowledge of condition, decreased mobility, decreased ROM, decreased strength, increased muscle spasms, impaired flexibility, postural dysfunction, and pain.    ACTIVITY LIMITATIONS community activity, driving, occupation, and squatting .      REHAB POTENTIAL: Good   CLINICAL DECISION MAKING: Evolving/moderate complexity   EVALUATION COMPLEXITY: Moderate     GOALS: Goals reviewed with patient? Yes   SHORT TERM GOALS: Target date: 01/13/2022   Patient to demonstrate independence in HEP  Baseline:Access Code: [email or web address] Goal status:  Semi compliant due to pain, ongoing   2.  Decrease R piriformis tenderness to 4/10 Baseline: 8/10; 02/24/22 Pain/tenderness 9/10 at worst Goal status: Ongoing   LONG TERM GOALS: Target date: 01/27/2022   Decrease 5x STS time to 10s Baseline: 12s; 02/24/22 28s arms crossed Goal status: Regressing, ongoing   2.  Increase R LE abd, flexion and extension to 4+/5 Baseline: 4/5; 02/24/22 Unchanged Goal status: Ongoing   3.  Increase FOTO score to 49 Baseline: 41; 02/24/22 41 Goal status: Ongoing         PLAN: PT FREQUENCY: 2x/week   PT DURATION: 4 weeks   PLANNED INTERVENTIONS: Therapeutic exercises, Therapeutic activity, Neuromuscular re-education, Balance training, Gait training, Patient/Family education, Joint mobilization, Stair training, aquatics and Manual therapy   PLAN FOR NEXT SESSION: HEP review and update, aerobic work, R hip strength and stretch, R LE strength and stretch, schedule aquatics   Voncille Lo, PT, [facility] Certified Exercise Expert for the Aging Adult  03/11/22 1:22 PM Phone: [phone] Fax: [phone]

## 2022-03-11 ENCOUNTER — Ambulatory Visit: Payer: Commercial Managed Care - HMO | Attending: Rheumatology | Admitting: Physical Therapy

## 2022-03-11 ENCOUNTER — Other Ambulatory Visit: Payer: Self-pay | Admitting: Internal Medicine

## 2022-03-11 ENCOUNTER — Ambulatory Visit (INDEPENDENT_AMBULATORY_CARE_PROVIDER_SITE_OTHER): Payer: Commercial Managed Care - HMO

## 2022-03-11 ENCOUNTER — Ambulatory Visit: Payer: Commercial Managed Care - HMO | Admitting: Physician Assistant

## 2022-03-11 ENCOUNTER — Encounter: Payer: Self-pay | Admitting: Physician Assistant

## 2022-03-11 VITALS — BP 120/71 | HR 69 | Ht 68.5 in | Wt 304.4 lb

## 2022-03-11 DIAGNOSIS — Z951 Presence of aortocoronary bypass graft: Secondary | ICD-10-CM

## 2022-03-11 DIAGNOSIS — M79641 Pain in right hand: Secondary | ICD-10-CM | POA: Diagnosis not present

## 2022-03-11 DIAGNOSIS — R87613 High grade squamous intraepithelial lesion on cytologic smear of cervix (HGSIL): Secondary | ICD-10-CM

## 2022-03-11 DIAGNOSIS — F419 Anxiety disorder, unspecified: Secondary | ICD-10-CM

## 2022-03-11 DIAGNOSIS — M7061 Trochanteric bursitis, right hip: Secondary | ICD-10-CM | POA: Diagnosis not present

## 2022-03-11 DIAGNOSIS — M19071 Primary osteoarthritis, right ankle and foot: Secondary | ICD-10-CM

## 2022-03-11 DIAGNOSIS — R296 Repeated falls: Secondary | ICD-10-CM

## 2022-03-11 DIAGNOSIS — M7631 Iliotibial band syndrome, right leg: Secondary | ICD-10-CM

## 2022-03-11 DIAGNOSIS — G4733 Obstructive sleep apnea (adult) (pediatric): Secondary | ICD-10-CM

## 2022-03-11 DIAGNOSIS — M6281 Muscle weakness (generalized): Secondary | ICD-10-CM

## 2022-03-11 DIAGNOSIS — Z87891 Personal history of nicotine dependence: Secondary | ICD-10-CM

## 2022-03-11 DIAGNOSIS — H2513 Age-related nuclear cataract, bilateral: Secondary | ICD-10-CM

## 2022-03-11 DIAGNOSIS — M19041 Primary osteoarthritis, right hand: Secondary | ICD-10-CM

## 2022-03-11 DIAGNOSIS — I10 Essential (primary) hypertension: Secondary | ICD-10-CM

## 2022-03-11 DIAGNOSIS — R2681 Unsteadiness on feet: Secondary | ICD-10-CM | POA: Diagnosis present

## 2022-03-11 DIAGNOSIS — M19072 Primary osteoarthritis, left ankle and foot: Secondary | ICD-10-CM

## 2022-03-11 DIAGNOSIS — Z87442 Personal history of urinary calculi: Secondary | ICD-10-CM

## 2022-03-11 DIAGNOSIS — Z8719 Personal history of other diseases of the digestive system: Secondary | ICD-10-CM

## 2022-03-11 DIAGNOSIS — M25562 Pain in left knee: Secondary | ICD-10-CM

## 2022-03-11 DIAGNOSIS — M19042 Primary osteoarthritis, left hand: Secondary | ICD-10-CM

## 2022-03-11 DIAGNOSIS — F32A Depression, unspecified: Secondary | ICD-10-CM

## 2022-03-11 DIAGNOSIS — E785 Hyperlipidemia, unspecified: Secondary | ICD-10-CM

## 2022-03-11 DIAGNOSIS — E1100 Type 2 diabetes mellitus with hyperosmolarity without nonketotic hyperglycemic-hyperosmolar coma (NKHHC): Secondary | ICD-10-CM

## 2022-03-11 DIAGNOSIS — M79642 Pain in left hand: Secondary | ICD-10-CM

## 2022-03-11 DIAGNOSIS — Z13820 Encounter for screening for osteoporosis: Secondary | ICD-10-CM

## 2022-03-11 DIAGNOSIS — G8929 Other chronic pain: Secondary | ICD-10-CM

## 2022-03-11 DIAGNOSIS — M7062 Trochanteric bursitis, left hip: Secondary | ICD-10-CM

## 2022-03-11 NOTE — Progress Notes (Signed)
X-rays are consistent with osteoarthritis of both hands.

## 2022-03-12 NOTE — Progress Notes (Signed)
ESR WNL.   CRP WNL.  RF negative.

## 2022-03-13 NOTE — Progress Notes (Signed)
Anti-CCP negative.

## 2022-03-17 NOTE — Therapy (Signed)
OUTPATIENT PHYSICAL THERAPY TREATMENT NOTE   Patient Name: [name] MRN: [id number] DOB:[birth year], 51 y.o., female Today's Date: 03/18/2022  PCP: Glenis Smoker, MD REFERRING PROVIDER: Bo Merino, MD  END OF SESSION:   PT End of Session - 03/18/22 1218     Visit Number 6    Number of Visits 8    Date for PT Re-Evaluation [id number]  recert [id number]   Authorization Type [employer]    PT Start Time 1225    PT Stop Time [id number]    PT Time Calculation (min) 48 min    Activity Tolerance Patient tolerated treatment well    Behavior During Therapy [facility] for tasks assessed/performed               Past Medical History:  Diagnosis Date   Anxiety    Arthritis    fingers   Cervical dysplasia    Coronary artery disease    Depression    Diabetes mellitus    Type II   Dyspnea 11/22/2013   GERD (gastroesophageal reflux disease)    High cholesterol    History of kidney stones    passed   Hypertension    no meds now   Kidney stones    Moderate episode of recurrent major depressive disorder ([place]) 12/28/2019   Neuropathy    feet and legs   Nuclear sclerotic cataract of both eyes 09/2019   Dr. Idolina Primer   PONV (postoperative nausea and vomiting)    one time   Sleep apnea    lost 70lbs no cpap x26yrs now   Stage 3a chronic kidney disease ([place]) 04/29/2020   Past Surgical History:  Procedure Laterality Date   CARDIAC CATHETERIZATION     4-57yrs ago   CERVICAL CONIZATION W/BX N/A 05/04/2018   Procedure: CONIZATION CERVIX WITH BIOPSY - COLD KNIFE;  Surgeon: Chancy Milroy, MD;  Location: [place];  Service: Gynecology;  Laterality: N/A;   CHOLECYSTECTOMY     CORONARY ARTERY BYPASS GRAFT N/A 10/24/2019   Procedure: CORONARY ARTERY BYPASS GRAFTING (CABG) times four, using left radial artery harvested endoscopically and right greater saphenous vein harvested endoscopically.;  Surgeon: Ivin Poot, MD;  Location: [place];  Service: Open Heart Surgery;   Laterality: N/A;   ELBOW FRACTURE SURGERY Left    ELBOW SURGERY Right    ENDOMETRIAL ABLATION     6 years ago   INCISION AND DRAINAGE     for boil- buttocks   INCISION AND DRAINAGE Left    Leg, Nephrotoxin fasciotimy   IRRIGATION AND DEBRIDEMENT ABSCESS Right 03/17/2017   Procedure: IRRIGATION AND DEBRIDEMENT RIGHT THIGH ABSCESS;  Surgeon: Michael Boston, MD;  Location: [facility];  Service: General;  Laterality: Right;   LAPAROSCOPIC APPENDECTOMY  10/04/2011   Procedure: APPENDECTOMY LAPAROSCOPIC;  Surgeon: Judieth Keens, DO;  Location: [facility];  Service: General;  Laterality: N/A;   LEFT HEART CATH AND CORONARY ANGIOGRAPHY N/A 10/06/2019   Procedure: LEFT HEART CATH AND CORONARY ANGIOGRAPHY;  Surgeon: Martinique, Peter M, MD;  Location: [place] [facility];  Service: Cardiovascular;  Laterality: N/A;   RADIAL ARTERY HARVEST Left 10/24/2019   Procedure: Radial Artery Harvest;  Surgeon: Ivin Poot, MD;  Location: [place];  Service: Open Heart Surgery;  Laterality: Left;   TEE WITHOUT CARDIOVERSION N/A 10/24/2019   Procedure: TRANSESOPHAGEAL ECHOCARDIOGRAM (TEE);  Surgeon: Prescott Gum, Collier Salina, MD;  Location: [place];  Service: Open Heart Surgery;  Laterality: N/A;   TENDON RECONSTRUCTION Right 07/02/2021  Procedure: Right wrist 6 dorsal compartment tenosynovectomy and tendon repair Right elbow lateral epicondylar debridement and tendon repair;  Surgeon: Bradly Bienenstock, MD;  Location: [facility];  Service: Orthopedics;  Laterality: Right;   WRIST FRACTURE SURGERY Left    WRIST SURGERY Right    Patient Active Problem List   Diagnosis Date Noted   Polyneuropathy associated with underlying disease (HCC) 07/30/2021   Dehydration 12/27/2020   Type 2 diabetes mellitus with hyperglycemia, with long-term current use of insulin (HCC) 11/11/2020   Stage 3a chronic kidney disease (HCC) 04/29/2020   COVID-19 virus vaccination declined 04/29/2020   Influenza vaccination declined 04/29/2020   Midline sternotomy  scar 04/17/2020   Left rib fracture 03/13/2020   Moderate episode of recurrent major depressive disorder (HCC) 12/28/2019   Encounter for post surgical wound check 12/06/2019   Type 2 diabetes mellitus with diabetic polyneuropathy, with long-term current use of insulin (HCC) 11/16/2019   Diabetes mellitus (HCC) 11/16/2019   Type 2 diabetes mellitus with retinopathy, with long-term current use of insulin (HCC) 11/16/2019   Dyslipidemia 11/16/2019   Postop check 11/15/2019   Diabetic retinopathy (HCC) 10/25/2019   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   Angina pectoris (HCC) 10/06/2019   Left-sided chest pain 06/13/2019   Post-operative state 06/06/2018   HGSIL (high grade squamous intraepithelial lesion) on Pap smear of cervix 02/25/2018   Right upper quadrant abdominal pain 12/30/2017   Polyarthritis 12/30/2017   OSA (obstructive sleep apnea) 11/22/2013   Dyspnea 11/22/2013   Uncontrolled type 2 diabetes mellitus with hyperosmolar nonketotic hyperglycemia (HCC) 07/21/2012   HTN (hypertension) 07/21/2012   Smoker 07/21/2012    REFERRING DIAG: M70.61,M70.62 (ICD-10-CM) - Trochanteric bursitis of both hips R29.6 (ICD-10-CM) - Frequent falls   THERAPY DIAG: Trochanteric bursitis of R hip, frequent falls  Rationale for Evaluation and Treatment Rehabilitation  PERTINENT HISTORY: History of Present Illness: [name] is a 51 y.o. female with history of osteoarthritis.  She gradually recovered from the fall 2022.  She fractured her left wrist and left elbow.  She also had surgery on her right wrist and right elbow due to torn tendon.  She states she went through physical therapy which was helpful.  She continues to have intermittent discomfort in her hands.  She reports intermittent swelling in her wrist and hands.  She also gets fluid retention in her lower extremities.  She has been experiencing pain in her right hip which she describes over the trochanteric area.  The left knee joint and left  shoulder joint pain resolved.  She has had frequent falls in the past.  She has never had physical therapy for fall prevention and lower extremity muscle strengthening.  She has been going to the gym for the last 2 months.  She has been doing aerobic exercises on the elliptical and treadmill.  PRECAUTIONS: fall  SUBJECTIVE: Pt enters aquatics pool with 0/10 pain.  I was able to sleep on my  right side last night. I notice that I have been sleeping better.   I was able to go down the steps today with out going sideways  PAIN:  Are you having pain? Yes: NPRS scale: 0/10 Pain location: R hip  Pain description: ache Aggravating factors: pressure Relieving factors: position change   OBJECTIVE: (objective measures completed at initial evaluation unless otherwise dated)   OBJECTIVE:    DIAGNOSTIC FINDINGS: Visit Diagnoses: Primary osteoarthritis of both hands-she complains of pain and discomfort in her bilateral hands.  She had bilateral PIP  and DIP thickening.  An exercise was emphasized.   Trochanteric bursitis of both hips-she has been having tenderness in her trochanteric area intermittently.  Her right trochanteric bursa was more tender.  She also had tenderness over her anterior superior iliac spine.  I will refer her to physical therapy.   Chronic pain of left knee -the pain in her left knee resolved.  X-rays obtained were unremarkable.   Primary osteoarthritis of both feet - X-rays were consistent with osteoarthritis. All autoimmune work-up was negative.   Frequent falls-patient had a fall in May 2022.  She has had several falls before.  She has not had physical therapy for fall prevention and lower extremity muscle strengthening.  I will refer her to physical therapy.   PATIENT SURVEYS:  FOTO 41(49 predicted)   COGNITION:           Overall cognitive status: Within functional limits for tasks assessed                          SENSATION: Not tested   MUSCLE LENGTH: Hamstrings:  Right 45 deg;   POSTURE:  Anterior PT   PALPATION: TTP R piriformis    LE ROM:   A/PROM Right 12/30/2021 Left 12/30/2021 R 02/24/22  Hip flexion     120d P  Hip extension     10d P  Hip abduction       Hip adduction       Hip internal rotation       Hip external rotation       Knee flexion       Knee extension       Ankle dorsiflexion       Ankle plantarflexion       Ankle inversion       Ankle eversion        (Blank rows = not tested)   LE MMT:   MMT Right 12/30/2021 Left 12/30/2021 R 02/24/22  Hip flexion 4 5 4   Hip extension 4 5 4   Hip abduction 4 5 4   Hip adduction       Hip internal rotation       Hip external rotation       Knee flexion       Knee extension 4 5 4   Ankle dorsiflexion 4 5 4   Ankle plantarflexion       Ankle inversion       Ankle eversion        (Blank rows = not tested)   LOWER EXTREMITY SPECIAL TESTS:  Hip special tests: Saralyn Pilar (FABER) test: negative, Thomas test: negative, and Piriformis test: positive  02/24/22 positive FABERS and FADIRS   FUNCTIONAL TESTS:  5 times sit to stand: 12s; 02/24/22 28s   GAIT: Distance walked: 6ft x2 Assistive device utilized: None Level of assistance: Complete Independence         TODAY'S TREATMENT: [facility] Adult PT Treatment:                                                DATE: 03-18-22 Aquatic therapy at [address] - therapeutic pool temp 92 degrees Treatment took place in water 3.8 to  4 ft 8 in.feet deep depending upon activity.  PT enters  without AD and independently. Pt entered and exited the pool holding onto  hand rail forward down steps and not sideways Pt pain level 0/10 at initiation of water walking.   Aquatic Exercise  Marching hip flexion with knee flexion BIL Hip abd/add BIL Hip ext/ knee flex with knee straight  Hip IR/ER with hip flex 90 degrees SLS and kicking forward ,side and backward for balance  on R and then L with 1 UE support and Submerged step 10 x on R and  L  without UE support Gastroc stretch with great toe against pool wall on R and L 30" each x 1 and then 2nd stretch with R foot behind Runners stretch x30" BIL Hamstring stretch x30" BIL Using yellow DB and bicycle and scissor  in deeper water Squats 2 x 20 reps Lunge with target 10 x on R and then L Up and down steps ( 5) 5 x forward without compensation. Pt with sore quad and received PT assisted standing quad stretch with overpressure in order to decrease tightness on descending steps Supine abdominal hollowing with VC to keep hips up and feet up with  yellow barbells in UE submerged Plank with yellow DB and hip abd/posterior chain   Walking forward/sidestepping  and backward  utilizing yellow aquatic DB.   Gave handout on [employer] water classes  Pt with minimal soreness behind greater trochanter on R . Received Aqua stretch in same area with decreased tissue tension post RX.    Pt requires the buoyancy of water for active assisted exercises with buoyancy supported for strengthening and AROM exercises. Hydrostatic pressure also supports joints by unweighting joint load by at least 50 % in 3-4 feet depth water. 80% in chest to neck deep water. Water will provide assistance with movement using the current and laminar flow while the buoyancy reduces weight bearing. Pt requires the viscosity of the water for resistance with strengthening exercises  [facility] Adult PT Treatment:                                                DATE: 03-11-22 Aquatic therapy at [address] - therapeutic pool temp 92 degrees Treatment took place in water 3.8 to  4 ft 8 in.feet deep depending upon activity.  PT enters  without AD and independently. Pt entered and exited the pool holding onto hand rail. Pt pain level 0/10 at initiation of water walking.   Aquatic Exercise  Pt educated on neutral posture and hip hinging in seated position with water at chest level x 10 with stretch to low back/abdominal  engagement and then x 10 with back at pool wall at external cue, VC for neck tucked to prevent hyperextension. Walking forward/sidestepping  and backward , Pt with decreased balance in water walking backwards SLS and kicking forward ,side and backward for balance  on R and then L Marching hip extension with knee flexion BIL Hip abd/add BIL Hip ext/ knee flex with knee straight  Hip IR/ER with hip flex 90 degrees Submerged step 10 x on R and L  without UE support Runners stretch x30" BIL Hamstring stretch x30" BIL Using yellow DB and bicycle and scissor  in deeper water Squats 2 x 20 reps Lunge with target 10 x on R and then L Supine abdominal hollowing with VC to keep hips up and feet up with  yellow barbells in UE submerged Plank wit yellow DB and  hip abd/posterior chain Side to side tuck and extension for core work Pt with soreness behind greater trochanter on R . Received Aqua stretch in same area with decreased tissue tension post RX.  Pt is " sore" but feels fine post Aquatics  Pt requires the buoyancy of water for active assisted exercises with buoyancy supported for strengthening and AROM exercises. Hydrostatic pressure also supports joints by unweighting joint load by at least 50 % in 3-4 feet depth water. 80% in chest to neck deep water. Water will provide assistance with movement using the current and laminar flow while the buoyancy reduces weight bearing. Pt requires the viscosity of the water for resistance with strengthening exercises   [facility] Adult PT Treatment:                                                DATE: 02/26/22 Therapeutic Exercise: Nustep L1 6 min Heel slides over towel against red strap(leg press) 30x Supine hip fallouts YTB 15x2 Reverse R hip fallout YTB 15x2 Bridge 15x2 R clams R hip flexor stretch 30s x2 Curl ups 15x Supine march, alternating 15/15  [facility] Adult PT Treatment:                                                DATE: 02/24/22 Therapeutic  Activity: 5x STS arms crossed 28s Re-assessment of ROM, strength and progress towards goals  [facility] Adult PT Treatment:                                                DATE: 01/21/22 Therapeutic Exercise: Nustep L2 8 min Seated table hamstring stretch 30s x3 Figure 4 R piriformis stretch 30s x3 Single leg bridge 15/15 SLR 15/15 Side lie clamshells 15/15 Side lie abduction with extension bias to target glute medius 15/15 Manual Therapy  Piriformis release R 8 min      PATIENT EDUCATION:  Education details: Discussed eval findings, rehab rationale and POC and patient is in agreement . [employer] water class schedule given post DC Person educated: Patient Education method: Explanation, Demonstration, and Handouts Education comprehension: verbalized understanding and needs further education     HOME EXERCISE PROGRAM: Access Code: [email or web address] URL: [email or web address] Date: 01/21/2022 Prepared by: Gustavus Bryant  Exercises - Supine Figure 4 Piriformis Stretch  - 2 x daily - 7 x weekly - 1 sets - 3 reps - 30s hold - Clamshell  - 2 x daily - 7 x weekly - 1 sets - 15 reps - Seated Table Hamstring Stretch  - 2 x daily - 7 x weekly - 1 sets - 3 reps - 30 hold - Figure 4 Bridge  - 2 x daily - 7 x weekly - 1 sets - 15 reps   ASSESSMENT:   CLINICAL IMPRESSION:  Ms Kushnir presents to aquatics with 0/10 pain and commenting that she is sleeping better and able to sleep on Right side at night.  Today , pt demonstrates R gastroc tightness greater than L and pt was able to implement stretches to increase ease of walking greater step length and walking backwards in  pool.   Pt is 0/10 before entering pool.  Pt is introduced to therapeutic effects of water and works/exercises continuously for 40 minutes.   Pt with increased balance today as she walked balance with walking balance noted from last aquatic session. Pt with R hip/greater troch soreness at end of session but received Aqua stretch with  decreased tissue tension at end of session. Pt also with R quad tightness assisted with PT overpressure and stretch with 0/10 tightness descending water steps..Pt was able to participate in 45 min of continuous exercise in water with no adverse effects today. Pt given [employer] class schedule handout.   OBJECTIVE IMPAIRMENTS decreased activity tolerance, decreased knowledge of condition, decreased mobility, decreased ROM, decreased strength, increased muscle spasms, impaired flexibility, postural dysfunction, and pain.    ACTIVITY LIMITATIONS community activity, driving, occupation, and squatting .      REHAB POTENTIAL: Good   CLINICAL DECISION MAKING: Evolving/moderate complexity   EVALUATION COMPLEXITY: Moderate     GOALS: Goals reviewed with patient? Yes   SHORT TERM GOALS: Target date: 01/13/2022   Patient to demonstrate independence in HEP  Baseline:Access Code: [email or web address] Goal status: Semi compliant due to pain, ongoing   2.  Decrease R piriformis tenderness to 4/10 Baseline: 8/10; 02/24/22 Pain/tenderness 9/10 at worst Goal status: Ongoing   LONG TERM GOALS: Target date: [id number] recert. target date 04-26-22   Decrease 5x STS time to 10s Baseline: 12s; 02/24/22 28s arms crossed Goal status: Regressing, ongoing   2.  Increase R LE abd, flexion and extension to 4+/5 Baseline: 4/5; 02/24/22 Unchanged Goal status: Ongoing   3.  Increase FOTO score to 49 Baseline: 41; 02/24/22 41 Goal status: Ongoing         PLAN: PT FREQUENCY: 2x/week   PT DURATION: 4 weeks   PLANNED INTERVENTIONS: Therapeutic exercises, Therapeutic activity, Neuromuscular re-education, Balance training, Gait training, Patient/Family education, Joint mobilization, Stair training, aquatics and Manual therapy   PLAN FOR NEXT SESSION: HEP review and update, aerobic work, R hip strength and stretch, R LE strength and stretch, schedule aquatics  Voncille Lo, PT, [facility] Certified Exercise Expert for  the Aging Adult  03/18/22 1:25 PM Phone: [phone] Fax: [phone]

## 2022-03-18 ENCOUNTER — Encounter: Payer: Self-pay | Admitting: Physical Therapy

## 2022-03-18 ENCOUNTER — Ambulatory Visit: Payer: Commercial Managed Care - HMO | Admitting: Physical Therapy

## 2022-03-18 DIAGNOSIS — M7061 Trochanteric bursitis, right hip: Secondary | ICD-10-CM

## 2022-03-18 DIAGNOSIS — R2681 Unsteadiness on feet: Secondary | ICD-10-CM

## 2022-03-18 DIAGNOSIS — M6281 Muscle weakness (generalized): Secondary | ICD-10-CM

## 2022-03-19 NOTE — Therapy (Signed)
OUTPATIENT PHYSICAL THERAPY TREATMENT NOTE   Patient Name: [name] MRN: [id number] DOB:[birth year], 51 y.o., female Today's Date: 03/20/2022  PCP: [name], MD REFERRING PROVIDER: Pollyann Savoy, MD  END OF SESSION:    PT End of Session - 03/20/22 1142     Visit Number 7    Number of Visits 8    Authorization Type [employer]    PT Start Time 1145    PT Stop Time 1230    PT Time Calculation (min) 45 min    Activity Tolerance Patient tolerated treatment well    Behavior During Therapy WFL for tasks assessed/performed                Past Medical History:  Diagnosis Date   Anxiety    Arthritis    fingers   Cervical dysplasia    Coronary artery disease    Depression    Diabetes mellitus    Type II   Dyspnea 11/22/2013   GERD (gastroesophageal reflux disease)    High cholesterol    History of kidney stones    passed   Hypertension    no meds now   Kidney stones    Moderate episode of recurrent major depressive disorder (HCC) 12/28/2019   Neuropathy    feet and legs   Nuclear sclerotic cataract of both eyes 09/2019   Dr. Shea Evans   PONV (postoperative nausea and vomiting)    one time   Sleep apnea    lost 70lbs no cpap x42yrs now   Stage 3a chronic kidney disease (HCC) 04/29/2020   Past Surgical History:  Procedure Laterality Date   CARDIAC CATHETERIZATION     4-67yrs ago   CERVICAL CONIZATION W/BX N/A 05/04/2018   Procedure: CONIZATION CERVIX WITH BIOPSY - COLD KNIFE;  Surgeon: Hermina Staggers, MD;  Location: [facility];  Service: Gynecology;  Laterality: N/A;   CHOLECYSTECTOMY     CORONARY ARTERY BYPASS GRAFT N/A 10/24/2019   Procedure: CORONARY ARTERY BYPASS GRAFTING (CABG) times four, using left radial artery harvested endoscopically and right greater saphenous vein harvested endoscopically.;  Surgeon: Kerin Perna, MD;  Location: [facility] OR;  Service: Open Heart Surgery;  Laterality: N/A;   ELBOW FRACTURE SURGERY Left     ELBOW SURGERY Right    ENDOMETRIAL ABLATION     6 years ago   INCISION AND DRAINAGE     for boil- buttocks   INCISION AND DRAINAGE Left    Leg, Nephrotoxin fasciotimy   IRRIGATION AND DEBRIDEMENT ABSCESS Right 03/17/2017   Procedure: IRRIGATION AND DEBRIDEMENT RIGHT THIGH ABSCESS;  Surgeon: Karie Soda, MD;  Location: [facility];  Service: General;  Laterality: Right;   LAPAROSCOPIC APPENDECTOMY  10/04/2011   Procedure: APPENDECTOMY LAPAROSCOPIC;  Surgeon: Rulon Abide, DO;  Location: [facility];  Service: General;  Laterality: N/A;   LEFT HEART CATH AND CORONARY ANGIOGRAPHY N/A 10/06/2019   Procedure: LEFT HEART CATH AND CORONARY ANGIOGRAPHY;  Surgeon: Swaziland, Peter M, MD;  Location: [facility];  Service: Cardiovascular;  Laterality: N/A;   RADIAL ARTERY HARVEST Left 10/24/2019   Procedure: Radial Artery Harvest;  Surgeon: Kerin Perna, MD;  Location: [facility] OR;  Service: Open Heart Surgery;  Laterality: Left;   TEE WITHOUT CARDIOVERSION N/A 10/24/2019   Procedure: TRANSESOPHAGEAL ECHOCARDIOGRAM (TEE);  Surgeon: Donata Clay, Theron Arista, MD;  Location: [facility] OR;  Service: Open Heart Surgery;  Laterality: N/A;   TENDON RECONSTRUCTION Right 07/02/2021   Procedure: Right wrist 6 dorsal compartment  tenosynovectomy and tendon repair Right elbow lateral epicondylar debridement and tendon repair;  Surgeon: Iran Planas, MD;  Location: [facility];  Service: Orthopedics;  Laterality: Right;   WRIST FRACTURE SURGERY Left    WRIST SURGERY Right    Patient Active Problem List   Diagnosis Date Noted   Polyneuropathy associated with underlying disease ([place]) 07/30/2021   Dehydration 12/27/2020   Type 2 diabetes mellitus with hyperglycemia, with long-term current use of insulin ([place]) 11/11/2020   Stage 3a chronic kidney disease ([place]) 04/29/2020   COVID-19 virus vaccination declined 04/29/2020   Influenza vaccination declined 04/29/2020   Midline sternotomy scar 04/17/2020   Left rib fracture 03/13/2020    Moderate episode of recurrent major depressive disorder ([place]) 12/28/2019   Encounter for post surgical wound check 12/06/2019   Type 2 diabetes mellitus with diabetic polyneuropathy, with long-term current use of insulin ([place]) 11/16/2019   Diabetes mellitus ([place]) 11/16/2019   Type 2 diabetes mellitus with retinopathy, with long-term current use of insulin ([place]) 11/16/2019   Dyslipidemia 11/16/2019   Postop check 11/15/2019   Diabetic retinopathy ([place]) 10/25/2019   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   Angina pectoris ([place]) 10/06/2019   Left-sided chest pain 06/13/2019   Post-operative state 06/06/2018   HGSIL (high grade squamous intraepithelial lesion) on Pap smear of cervix 02/25/2018   Right upper quadrant abdominal pain 12/30/2017   Polyarthritis 12/30/2017   OSA (obstructive sleep apnea) 11/22/2013   Dyspnea 11/22/2013   Uncontrolled type 2 diabetes mellitus with hyperosmolar nonketotic hyperglycemia ([place]) 07/21/2012   HTN (hypertension) 07/21/2012   Smoker 07/21/2012    REFERRING DIAG: M70.61,M70.62 (ICD-10-CM) - Trochanteric bursitis of both hips R29.6 (ICD-10-CM) - Frequent falls   THERAPY DIAG: Trochanteric bursitis of R hip, frequent falls  Rationale for Evaluation and Treatment Rehabilitation  PERTINENT HISTORY: History of Present Illness: [name] is a 51 y.o. female with history of osteoarthritis.  She gradually recovered from the fall 2022.  She fractured her left wrist and left elbow.  She also had surgery on her right wrist and right elbow due to torn tendon.  She states she went through physical therapy which was helpful.  She continues to have intermittent discomfort in her hands.  She reports intermittent swelling in her wrist and hands.  She also gets fluid retention in her lower extremities.  She has been experiencing pain in her right hip which she describes over the trochanteric area.  The left knee joint and left shoulder joint pain resolved.  She has had frequent  falls in the past.  She has never had physical therapy for fall prevention and lower extremity muscle strengthening.  She has been going to the gym for the last 2 months.  She has been doing aerobic exercises on the elliptical and treadmill.  PRECAUTIONS: fall  SUBJECTIVE: Patient reports soreness after last session. She reports when she gets into her car based on how she gets in, that this is hurting her hip  PAIN:  Are you having pain? Yes: NPRS scale: 1/10 Pain location: R hip  Pain description: ache Aggravating factors: pressure Relieving factors: position change   OBJECTIVE: (objective measures completed at initial evaluation unless otherwise dated)   OBJECTIVE:    DIAGNOSTIC FINDINGS: Visit Diagnoses: Primary osteoarthritis of both hands-she complains of pain and discomfort in her bilateral hands.  She had bilateral PIP and DIP thickening.  An exercise was emphasized.   Trochanteric bursitis of both hips-she has been having tenderness in her trochanteric area  intermittently.  Her right trochanteric bursa was more tender.  She also had tenderness over her anterior superior iliac spine.  I will refer her to physical therapy.   Chronic pain of left knee -the pain in her left knee resolved.  X-rays obtained were unremarkable.   Primary osteoarthritis of both feet - X-rays were consistent with osteoarthritis. All autoimmune work-up was negative.   Frequent falls-patient had a fall in May 2022.  She has had several falls before.  She has not had physical therapy for fall prevention and lower extremity muscle strengthening.  I will refer her to physical therapy.   PATIENT SURVEYS:  FOTO 41(49 predicted)   COGNITION:           Overall cognitive status: Within functional limits for tasks assessed                          SENSATION: Not tested   MUSCLE LENGTH: Hamstrings: Right 45 deg;   POSTURE:  Anterior PT   PALPATION: TTP R piriformis    LE ROM:   A/PROM  Right 12/30/2021 Left 12/30/2021 R 02/24/22  Hip flexion     120d P  Hip extension     10d P  Hip abduction       Hip adduction       Hip internal rotation       Hip external rotation       Knee flexion       Knee extension       Ankle dorsiflexion       Ankle plantarflexion       Ankle inversion       Ankle eversion        (Blank rows = not tested)   LE MMT:   MMT Right 12/30/2021 Left 12/30/2021 R 02/24/22  Hip flexion 4 5 4   Hip extension 4 5 4   Hip abduction 4 5 4   Hip adduction       Hip internal rotation       Hip external rotation       Knee flexion       Knee extension 4 5 4   Ankle dorsiflexion 4 5 4   Ankle plantarflexion       Ankle inversion       Ankle eversion        (Blank rows = not tested)   LOWER EXTREMITY SPECIAL TESTS:  Hip special tests: Saralyn Pilar (FABER) test: negative, Thomas test: negative, and Piriformis test: positive  02/24/22 positive FABERS and FADIRS   FUNCTIONAL TESTS:  5 times sit to stand: 12s; 02/24/22 28s   GAIT: Distance walked: 90ft x2 Assistive device utilized: None Level of assistance: Complete Independence         TODAY'S TREATMENT: [facility] Adult PT Treatment:                                                DATE: 03/19/2022 Aquatic therapy at [address] - therapeutic pool temp 92 degrees Treatment took place in water 3.8 to  4 ft 8 in.feet deep depending upon activity.  PT enters  without AD and independently. Pt entered and exited the pool holding onto hand rail forward down steps and not sideways Pt pain level 1/10 at initiation of water walking.   Aquatic Exercise Walking forward/backward/side  stepping with yellow dumbbells Marching hip flexion with knee flexion BIL x20 Hip abd/add BIL x20 Hip ext/flex with knee straight x10 Hip IR/ER with hip flex 90 degrees x10 SLS and kicking forward ,side and backward for balance  on R and then L with 1 UE support Submerged step ups x10 BIL forward/lateral Step ups with  knee drive Z56 BIL Heel/toe raises x20 Gastroc stretch with great toe against pool wall on R and L 30" each x 1 and then 2nd stretch with R foot behind Runners stretch x30" BIL Hamstring stretch x30" BIL Figure 4 squat stretch x45" BIL Using yellow DB and bicycle and scissor in deeper water x1' each Squats 2 x 20 reps Lunge walk forwards with yellow DB by sides x 2 laps Side squat walk with yellow DB shoulder abd/add x2 laps STS from third step from bottom x10 no UE support  Pt requires the buoyancy of water for active assisted exercises with buoyancy supported for strengthening and AROM exercises. Hydrostatic pressure also supports joints by unweighting joint load by at least 50 % in 3-4 feet depth water. 80% in chest to neck deep water. Water will provide assistance with movement using the current and laminar flow while the buoyancy reduces weight bearing. Pt requires the viscosity of the water for resistance with strengthening exercises  [facility] Adult PT Treatment:                                                DATE: 03-18-22 Aquatic therapy at [facility] [address] - therapeutic pool temp 92 degrees Treatment took place in water 3.8 to  4 ft 8 in.feet deep depending upon activity.  PT enters  without AD and independently. Pt entered and exited the pool holding onto hand rail forward down steps and not sideways Pt pain level 0/10 at initiation of water walking.   Aquatic Exercise  Marching hip flexion with knee flexion BIL Hip abd/add BIL Hip ext/ knee flex with knee straight  Hip IR/ER with hip flex 90 degrees SLS and kicking forward ,side and backward for balance  on R and then L with 1 UE support and Submerged step 10 x on R and L  without UE support Gastroc stretch with great toe against pool wall on R and L 30" each x 1 and then 2nd stretch with R foot behind Runners stretch x30" BIL Hamstring stretch x30" BIL Using yellow DB and bicycle and scissor  in deeper water Squats  2 x 20 reps Lunge with target 10 x on R and then L Up and down steps ( 5) 5 x forward without compensation. Pt with sore quad and received PT assisted standing quad stretch with overpressure in order to decrease tightness on descending steps Supine abdominal hollowing with VC to keep hips up and feet up with  yellow barbells in UE submerged Plank with yellow DB and hip abd/posterior chain   Walking forward/sidestepping  and backward  utilizing yellow aquatic DB.   Gave handout on [employer] water classes  Pt with minimal soreness behind greater trochanter on R . Received Aqua stretch in same area with decreased tissue tension post RX.    Pt requires the buoyancy of water for active assisted exercises with buoyancy supported for strengthening and AROM exercises. Hydrostatic pressure also supports joints by unweighting joint load by at least 50 %  in 3-4 feet depth water. 80% in chest to neck deep water. Water will provide assistance with movement using the current and laminar flow while the buoyancy reduces weight bearing. Pt requires the viscosity of the water for resistance with strengthening exercises  [facility] Adult PT Treatment:                                                DATE: 03-11-22 Aquatic therapy at [address] - therapeutic pool temp 92 degrees Treatment took place in water 3.8 to  4 ft 8 in.feet deep depending upon activity.  PT enters  without AD and independently. Pt entered and exited the pool holding onto hand rail. Pt pain level 0/10 at initiation of water walking.   Aquatic Exercise  Pt educated on neutral posture and hip hinging in seated position with water at chest level x 10 with stretch to low back/abdominal engagement and then x 10 with back at pool wall at external cue, VC for neck tucked to prevent hyperextension. Walking forward/sidestepping  and backward , Pt with decreased balance in water walking backwards SLS and kicking forward ,side and backward for  balance  on R and then L Marching hip extension with knee flexion BIL Hip abd/add BIL Hip ext/ knee flex with knee straight  Hip IR/ER with hip flex 90 degrees Submerged step 10 x on R and L  without UE support Runners stretch x30" BIL Hamstring stretch x30" BIL Using yellow DB and bicycle and scissor  in deeper water Squats 2 x 20 reps Lunge with target 10 x on R and then L Supine abdominal hollowing with VC to keep hips up and feet up with  yellow barbells in UE submerged Plank wit yellow DB and hip abd/posterior chain Side to side tuck and extension for core work Pt with soreness behind greater trochanter on R . Received Aqua stretch in same area with decreased tissue tension post RX.  Pt is " sore" but feels fine post Aquatics  Pt requires the buoyancy of water for active assisted exercises with buoyancy supported for strengthening and AROM exercises. Hydrostatic pressure also supports joints by unweighting joint load by at least 50 % in 3-4 feet depth water. 80% in chest to neck deep water. Water will provide assistance with movement using the current and laminar flow while the buoyancy reduces weight bearing. Pt requires the viscosity of the water for resistance with strengthening exercises    PATIENT EDUCATION:  Education details: Discussed eval findings, rehab rationale and POC and patient is in agreement . [employer] water class schedule given post DC Person educated: Patient Education method: Explanation, Demonstration, and Handouts Education comprehension: verbalized understanding and needs further education     HOME EXERCISE PROGRAM: Access Code: [email or web address] URL: [email or web address] Date: 01/21/2022 Prepared by: Sharlynn Oliphant  Exercises - Supine Figure 4 Piriformis Stretch  - 2 x daily - 7 x weekly - 1 sets - 3 reps - 30s hold - Clamshell  - 2 x daily - 7 x weekly - 1 sets - 15 reps - Seated Table Hamstring Stretch  - 2 x daily - 7 x weekly - 1 sets - 3 reps -  30 hold - Figure 4 Bridge  - 2 x daily - 7 x weekly - 1 sets - 15 reps   ASSESSMENT:   CLINICAL IMPRESSION:  Patient presents to aquatic PT session with 1/10 pain at the beginning of the session and reports some muscle soreness after last session. Session today continued to focus on strengthening for the R hip, general conditioning, and stretching for the R gastroc, hamstrings, and hip in the aquatic environment for use of buoyancy to offload joints and the viscosity of water as resistance during therapeutic exercise. Patient was able to tolerate all prescribed exercises in the aquatic environment with no adverse effects and reports 2/10 pain at the end of the session that she describes as "arthritic pain" in her Rt hip that increases with activity and movement. Patient continues to benefit from skilled PT services on land and aquatic based and should be progressed as able to improve functional independence.  OBJECTIVE IMPAIRMENTS decreased activity tolerance, decreased knowledge of condition, decreased mobility, decreased ROM, decreased strength, increased muscle spasms, impaired flexibility, postural dysfunction, and pain.    ACTIVITY LIMITATIONS community activity, driving, occupation, and squatting .      REHAB POTENTIAL: Good   CLINICAL DECISION MAKING: Evolving/moderate complexity   EVALUATION COMPLEXITY: Moderate     GOALS: Goals reviewed with patient? Yes   SHORT TERM GOALS: Target date: 01/13/2022   Patient to demonstrate independence in HEP  Baseline:Access Code: [email or web address] Goal status: Semi compliant due to pain, ongoing   2.  Decrease R piriformis tenderness to 4/10 Baseline: 8/10; 02/24/22 Pain/tenderness 9/10 at worst Goal status: Ongoing   LONG TERM GOALS: Target date: [id number] recert. target date 04-26-22   Decrease 5x STS time to 10s Baseline: 12s; 02/24/22 28s arms crossed Goal status: Regressing, ongoing   2.  Increase R LE abd, flexion and extension to  4+/5 Baseline: 4/5; 02/24/22 Unchanged Goal status: Ongoing   3.  Increase FOTO score to 49 Baseline: 41; 02/24/22 41 Goal status: Ongoing         PLAN: PT FREQUENCY: 2x/week   PT DURATION: 4 weeks   PLANNED INTERVENTIONS: Therapeutic exercises, Therapeutic activity, Neuromuscular re-education, Balance training, Gait training, Patient/Family education, Joint mobilization, Stair training, aquatics and Manual therapy   PLAN FOR NEXT SESSION: HEP review and update, aerobic work, R hip strength and stretch, R LE strength and stretch, schedule [employer], [place] 03/20/22 12:29 PM

## 2022-03-20 ENCOUNTER — Ambulatory Visit: Payer: Commercial Managed Care - HMO

## 2022-03-20 DIAGNOSIS — M6281 Muscle weakness (generalized): Secondary | ICD-10-CM

## 2022-03-20 DIAGNOSIS — R2681 Unsteadiness on feet: Secondary | ICD-10-CM

## 2022-03-20 DIAGNOSIS — M7061 Trochanteric bursitis, right hip: Secondary | ICD-10-CM

## 2022-03-24 NOTE — Therapy (Signed)
OUTPATIENT PHYSICAL THERAPY TREATMENT NOTE   Patient Name: [name] MRN: [id number] DOB:09-22-1970, 51 y.o., female Today's Date: 03/25/2022  PCP: Glenis Smoker, MD REFERRING PROVIDER: Bo Merino, MD  END OF SESSION:    PT End of Session - 03/25/22 1412     Visit Number 8    Number of Visits 8    Date for PT Re-Evaluation 01/27/22    Authorization Type [employer]    PT Start Time 1415    PT Stop Time 0300    PT Time Calculation (min) 765 min    Activity Tolerance Patient tolerated treatment well    Behavior During Therapy [facility] for tasks assessed/performed                 Past Medical History:  Diagnosis Date   Anxiety    Arthritis    fingers   Cervical dysplasia    Coronary artery disease    Depression    Diabetes mellitus    Type II   Dyspnea 11/22/2013   GERD (gastroesophageal reflux disease)    High cholesterol    History of kidney stones    passed   Hypertension    no meds now   Kidney stones    Moderate episode of recurrent major depressive disorder ([place]) 12/28/2019   Neuropathy    feet and legs   Nuclear sclerotic cataract of both eyes 09/2019   Dr. Idolina Primer   PONV (postoperative nausea and vomiting)    one time   Sleep apnea    lost 70lbs no cpap x20yr now   Stage 3a chronic kidney disease ([place] 04/29/2020   Past Surgical History:  Procedure Laterality Date   CARDIAC CATHETERIZATION     4-553yrago   CERVICAL CONIZATION W/BX N/A 05/04/2018   Procedure: CONIZATION CERVIX WITH BIOPSY - COLD KNIFE;  Surgeon: ErChancy MilroyMD;  [facility]: [facility] Service: Gynecology;  Laterality: N/A;   CHOLECYSTECTOMY     CORONARY ARTERY BYPASS GRAFT N/A 10/24/2019   Procedure: CORONARY ARTERY BYPASS GRAFTING (CABG) times four, using left radial artery harvested endoscopically and right greater saphenous vein harvested endoscopically.;  Surgeon: VaIvin PootMD;  [facility]: [facility] Service: Open Heart Surgery;   Laterality: N/A;   ELBOW FRACTURE SURGERY Left    ELBOW SURGERY Right    ENDOMETRIAL ABLATION     6 years ago   INCISION AND DRAINAGE     for boil- buttocks   INCISION AND DRAINAGE Left    Leg, Nephrotoxin fasciotimy   IRRIGATION AND DEBRIDEMENT ABSCESS Right 03/17/2017   Procedure: IRRIGATION AND DEBRIDEMENT RIGHT THIGH ABSCESS;  Surgeon: GrMichael BostonMD;  Location: [facility];  Service: General;  Laterality: Right;   LAPAROSCOPIC APPENDECTOMY  10/04/2011   Procedure: APPENDECTOMY LAPAROSCOPIC;  Surgeon: BrJudieth KeensDO;  Location: [facility];  Service: General;  Laterality: N/A;   LEFT HEART CATH AND CORONARY ANGIOGRAPHY N/A 10/06/2019   Procedure: LEFT HEART CATH AND CORONARY ANGIOGRAPHY;  Surgeon: JoMartiniquePeter M, MD;  [facility];  Service: Cardiovascular;  Laterality: N/A;   RADIAL ARTERY HARVEST Left 10/24/2019   Procedure: Radial Artery Harvest;  Surgeon: VaIvin PootMD;  Location: [facility] Service: Open Heart Surgery;  Laterality: Left;   TEE WITHOUT CARDIOVERSION N/A 10/24/2019   Procedure: TRANSESOPHAGEAL ECHOCARDIOGRAM (TEE);  Surgeon: VaPrescott GumPeCollier SalinaMD;  Location: [facility] Service: Open Heart Surgery;  Laterality: N/A;   TENDON RECONSTRUCTION Right  07/02/2021   Procedure: Right wrist 6 dorsal compartment tenosynovectomy and tendon repair Right elbow lateral epicondylar debridement and tendon repair;  Surgeon: Iran Planas, MD;  Location: [place];  Service: Orthopedics;  Laterality: Right;   WRIST FRACTURE SURGERY Left    WRIST SURGERY Right    Patient Active Problem List   Diagnosis Date Noted   Polyneuropathy associated with underlying disease ([place]) 07/30/2021   Dehydration 12/27/2020   Type 2 diabetes mellitus with hyperglycemia, with long-term current use of insulin ([place]) 11/11/2020   Stage 3a chronic kidney disease ([place]) 04/29/2020   COVID-19 virus vaccination declined 04/29/2020   Influenza vaccination declined 04/29/2020   Midline sternotomy  scar 04/17/2020   Left rib fracture 03/13/2020   Moderate episode of recurrent major depressive disorder ([place]) 12/28/2019   Encounter for post surgical wound check 12/06/2019   Type 2 diabetes mellitus with diabetic polyneuropathy, with long-term current use of insulin ([place]) 11/16/2019   Diabetes mellitus ([place]) 11/16/2019   Type 2 diabetes mellitus with retinopathy, with long-term current use of insulin ([place]) 11/16/2019   Dyslipidemia 11/16/2019   Postop check 11/15/2019   Diabetic retinopathy ([place]) 10/25/2019   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   Angina pectoris ([place]) 10/06/2019   Left-sided chest pain 06/13/2019   Post-operative state 06/06/2018   HGSIL (high grade squamous intraepithelial lesion) on Pap smear of cervix 02/25/2018   Right upper quadrant abdominal pain 12/30/2017   Polyarthritis 12/30/2017   OSA (obstructive sleep apnea) 11/22/2013   Dyspnea 11/22/2013   Uncontrolled type 2 diabetes mellitus with hyperosmolar nonketotic hyperglycemia ([place]) 07/21/2012   HTN (hypertension) 07/21/2012   Smoker 07/21/2012    REFERRING DIAG: M70.61,M70.62 (ICD-10-CM) - Trochanteric bursitis of both hips R29.6 (ICD-10-CM) - Frequent falls   THERAPY DIAG: Trochanteric bursitis of R hip, frequent falls  Rationale for Evaluation and Treatment Rehabilitation  PERTINENT HISTORY: History of Present Illness: [name] is a 51 y.o. female with history of osteoarthritis.  She gradually recovered from the fall 2022.  She fractured her left wrist and left elbow.  She also had surgery on her right wrist and right elbow due to torn tendon.  She states she went through physical therapy which was helpful.  She continues to have intermittent discomfort in her hands.  She reports intermittent swelling in her wrist and hands.  She also gets fluid retention in her lower extremities.  She has been experiencing pain in her right hip which she describes over the trochanteric area.  The left knee joint and left  shoulder joint pain resolved.  She has had frequent falls in the past.  She has never had physical therapy for fall prevention and lower extremity muscle strengthening.  She has been going to the gym for the last 2 months.  She has been doing aerobic exercises on the elliptical and treadmill.  PRECAUTIONS: fall  SUBJECTIVE: Patient reports soreness after last session. She reports when she gets into her car based on how she gets in, that this is hurting her hip  PAIN:  Are you having pain? Yes: NPRS scale: 1/10 Pain location: R hip  Pain description: ache Aggravating factors: pressure Relieving factors: position change   OBJECTIVE: (objective measures completed at initial evaluation unless otherwise dated)   OBJECTIVE:    DIAGNOSTIC FINDINGS: Visit Diagnoses: Primary osteoarthritis of both hands-she complains of pain and discomfort in her bilateral hands.  She had bilateral PIP and DIP thickening.  An exercise was emphasized.   Trochanteric bursitis of both  hips-she has been having tenderness in her trochanteric area intermittently.  Her right trochanteric bursa was more tender.  She also had tenderness over her anterior superior iliac spine.  I will refer her to physical therapy.   Chronic pain of left knee -the pain in her left knee resolved.  X-rays obtained were unremarkable.   Primary osteoarthritis of both feet - X-rays were consistent with osteoarthritis. All autoimmune work-up was negative.   Frequent falls-patient had a fall in May 2022.  She has had several falls before.  She has not had physical therapy for fall prevention and lower extremity muscle strengthening.  I will refer her to physical therapy.   PATIENT SURVEYS:  FOTO 41(49 predicted)   COGNITION:           Overall cognitive status: Within functional limits for tasks assessed                          SENSATION: Not tested   MUSCLE LENGTH: Hamstrings: Right 45 deg;   POSTURE:  Anterior PT    PALPATION: TTP R piriformis    LE ROM:   A/PROM Right 12/30/2021 Left 12/30/2021 R 02/24/22  Hip flexion     120d P  Hip extension     10d P  Hip abduction       Hip adduction       Hip internal rotation       Hip external rotation       Knee flexion       Knee extension       Ankle dorsiflexion       Ankle plantarflexion       Ankle inversion       Ankle eversion        (Blank rows = not tested)   LE MMT:   MMT Right 12/30/2021 Left 12/30/2021 R 02/24/22  Hip flexion 4 5 4   Hip extension 4 5 4   Hip abduction 4 5 4   Hip adduction       Hip internal rotation       Hip external rotation       Knee flexion       Knee extension 4 5 4   Ankle dorsiflexion 4 5 4   Ankle plantarflexion       Ankle inversion       Ankle eversion        (Blank rows = not tested)   LOWER EXTREMITY SPECIAL TESTS:  Hip special tests: Saralyn Pilar (FABER) test: negative, Thomas test: negative, and Piriformis test: positive  02/24/22 positive FABERS and FADIRS   FUNCTIONAL TESTS:  5 times sit to stand: 12s; 02/24/22 28s   GAIT: Distance walked: 9f x2 Assistive device utilized: None Level of assistance: Complete Independence         TODAY'S TREATMENT:  [facility] Adult PT Treatment:                                                DATE: 03-25-22 [address] therapeutic pool temp 92 degrees Treatment took place in water 3.8 to  4 ft 8 in.feet deep depending upon activity.  PT enters  without AD and independently. Pt entered and exited the pool holding onto hand rail. Pt pain level 0/10 at initiation of water walking.  Aquatic Exercise Walking forward/backward/side stepping with yellow dumbbells Side squat walk with yellow DB shoulder abd/add x2 laps BIL Heel/toe raises x20 with 5 lb wt SL heel raise R 20/20 (burning at 11/20  then 25/25  no burning.  L 20 x and 25 x  no burning Marching hip flexion with knee flexion with 5lb wt BIL x20 Hip abd/add BIL x 20 5lb  wt  Hip IR/ER with hip flex 90 degrees x20  5 lb weight. Squats 2 x 20 reps SLS and kicking forward ,side and backward for balance  on R and then L with 1 UE support Submerged step ups x10 BIL forward/lateral Submerged step ups 2 x 10 R and L with 5 lb wt each LE Gastroc stretch with great toe against pool wall on R and L 30" each x 1 and then 2nd stretch with R foot behind Runners stretch x30" BIL Hamstring stretch x30" BIL Figure 4 squat stretch x45" BIL Lunge walk  with 5 lb weights 6 lengths of pool STS from third step from bottom x10 no UE support Using yellow DB and bicycle and scissor  and jumping jacks in deeper water x 1 ' each Treading water 3 min Supine abdominal hollowing with VC to keep hips up and feet up with  yellow barbells in UE submerged Swimming strokes  side stroke, breast stroke, back stroke and free style for last 10 min of pool time continuous  Pt requires the buoyancy of water for active assisted exercises with buoyancy supported for strengthening and AROM exercises. Hydrostatic pressure also supports joints by unweighting joint load by at least 50 % in 3-4 feet depth water. 80% in chest to neck deep water. Water will provide assistance with movement using the current and laminar flow while the buoyancy reduces weight bearing. Pt requires the viscosity of the water for resistance with strengthening exercises   [facility] Adult PT Treatment:                                                DATE: 03/19/2022 Aquatic therapy at [address] - therapeutic pool temp 92 degrees Treatment took place in water 3.8 to  4 ft 8 in.feet deep depending upon activity.  PT enters  without AD and independently. Pt entered and exited the pool holding onto hand rail forward down steps and not sideways Pt pain level 1/10 at initiation of water walking.   Aquatic Exercise Walking forward/backward/side stepping with yellow dumbbells Marching hip flexion with knee flexion BIL  x20 Hip abd/add BIL x20 Hip ext/flex with knee straight x10 Hip IR/ER with hip flex 90 degrees x10 SLS and kicking forward ,side and backward for balance  on R and then L with 1 UE support Submerged step ups x10 BIL forward/lateral Step ups with knee drive F02 BIL Heel/toe raises x20 Gastroc stretch with great toe against pool wall on R and L 30" each x 1 and then 2nd stretch with R foot behind Runners stretch x30" BIL Hamstring stretch x30" BIL Figure 4 squat stretch x45" BIL Using yellow DB and bicycle and scissor in deeper water x1' each Squats 2 x 20 reps Lunge walk forwards with yellow DB by sides x 2 laps Side squat walk with yellow DB shoulder abd/add x2 laps STS from third step from bottom x10 no UE support  Pt requires  the buoyancy of water for active assisted exercises with buoyancy supported for strengthening and AROM exercises. Hydrostatic pressure also supports joints by unweighting joint load by at least 50 % in 3-4 feet depth water. 80% in chest to neck deep water. Water will provide assistance with movement using the current and laminar flow while the buoyancy reduces weight bearing. Pt requires the viscosity of the water for resistance with strengthening exercises  [facility] Adult PT Treatment:                                                DATE: 03-18-22 Aquatic therapy at [address] - therapeutic pool temp 92 degrees Treatment took place in water 3.8 to  4 ft 8 in.feet deep depending upon activity.  PT enters  without AD and independently. Pt entered and exited the pool holding onto hand rail forward down steps and not sideways Pt pain level 0/10 at initiation of water walking.   Aquatic Exercise  Marching hip flexion with knee flexion BIL Hip abd/add BIL Hip ext/ knee flex with knee straight  Hip IR/ER with hip flex 90 degrees SLS and kicking forward ,side and backward for balance  on R and then L with 1 UE support and Submerged step 10 x on R and L   without UE support Gastroc stretch with great toe against pool wall on R and L 30" each x 1 and then 2nd stretch with R foot behind Runners stretch x30" BIL Hamstring stretch x30" BIL Using yellow DB and bicycle and scissor  in deeper water Squats 2 x 20 reps Lunge with target 10 x on R and then L Up and down steps ( 5) 5 x forward without compensation. Pt with sore quad and received PT assisted standing quad stretch with overpressure in order to decrease tightness on descending steps Supine abdominal hollowing with VC to keep hips up and feet up with  yellow barbells in UE submerged Plank with yellow DB and hip abd/posterior chain   Walking forward/sidestepping  and backward  utilizing yellow aquatic DB.   Gave handout on [employer] water classes  Pt with minimal soreness behind greater trochanter on R . Received Aqua stretch in same area with decreased tissue tension post RX.    Pt requires the buoyancy of water for active assisted exercises with buoyancy supported for strengthening and AROM exercises. Hydrostatic pressure also supports joints by unweighting joint load by at least 50 % in 3-4 feet depth water. 80% in chest to neck deep water. Water will provide assistance with movement using the current and laminar flow while the buoyancy reduces weight bearing. Pt requires the viscosity of the water for resistance with strengthening exercises  [facility] Adult PT Treatment:                                                DATE: 03-11-22 Aquatic therapy at [address] - therapeutic pool temp 92 degrees Treatment took place in water 3.8 to  4 ft 8 in.feet deep depending upon activity.  PT enters  without AD and independently. Pt entered and exited the pool holding onto hand rail. Pt pain level 0/10 at initiation of water walking.  Aquatic Exercise  Pt educated on neutral posture and hip hinging in seated position with water at chest level x 10 with stretch to low back/abdominal  engagement and then x 10 with back at pool wall at external cue, VC for neck tucked to prevent hyperextension. Walking forward/sidestepping  and backward , Pt with decreased balance in water walking backwards SLS and kicking forward ,side and backward for balance  on R and then L Marching hip extension with knee flexion BIL Hip abd/add BIL Hip ext/ knee flex with knee straight  Hip IR/ER with hip flex 90 degrees Submerged step 10 x on R and L  without UE support Runners stretch x30" BIL Hamstring stretch x30" BIL Using yellow DB and bicycle and scissor  in deeper water Squats 2 x 20 reps Lunge with target 10 x on R and then L Supine abdominal hollowing with VC to keep hips up and feet up with  yellow barbells in UE submerged Plank wit yellow DB and hip abd/posterior chain Side to side tuck and extension for core work Pt with soreness behind greater trochanter on R . Received Aqua stretch in same area with decreased tissue tension post RX.  Pt is " sore" but feels fine post Aquatics  Pt requires the buoyancy of water for active assisted exercises with buoyancy supported for strengthening and AROM exercises. Hydrostatic pressure also supports joints by unweighting joint load by at least 50 % in 3-4 feet depth water. 80% in chest to neck deep water. Water will provide assistance with movement using the current and laminar flow while the buoyancy reduces weight bearing. Pt requires the viscosity of the water for resistance with strengthening exercises    PATIENT EDUCATION:  Education details: Discussed eval findings, rehab rationale and POC and patient is in agreement . [employer] water class schedule given post DC Person educated: Patient Education method: Explanation, Demonstration, and Handouts Education comprehension: verbalized understanding and needs further education     HOME EXERCISE PROGRAM: Access Code: [email or web address] URL: [email or web address] Date: 01/21/2022 Prepared by:  Sharlynn Oliphant  Exercises - Supine Figure 4 Piriformis Stretch  - 2 x daily - 7 x weekly - 1 sets - 3 reps - 30s hold - Clamshell  - 2 x daily - 7 x weekly - 1 sets - 15 reps - Seated Table Hamstring Stretch  - 2 x daily - 7 x weekly - 1 sets - 3 reps - 30 hold - Figure 4 Bridge  - 2 x daily - 7 x weekly - 1 sets - 15 reps   ASSESSMENT:   CLINICAL IMPRESSION:     Patient presents to aquatic PT session with 0/10  Session today continued to focus on strengthening for the R hip, general conditioning, and stretching for the R gastroc, hamstrings, and hip in the aquatic environment for use of buoyancy to offload joints and the viscosity of water as resistance during therapeutic exercise. Pt was ready for more intense cardio and full body swimming for conditioning and strength and endurance.  No pain at end of session.  Patient was able to tolerate all prescribed exercises in the aquatic environment with no adverse effect. Pt may benefit from return to land earlier if she continues to not have pain with all exercises in pool  Pt was introduced to community wellness with aquatics in the area to continue after [place] decreased activity tolerance, decreased knowledge of condition, decreased mobility, decreased ROM, decreased strength, increased muscle spasms, impaired flexibility, postural  dysfunction, and pain.    ACTIVITY LIMITATIONS community activity, driving, occupation, and squatting .      REHAB POTENTIAL: Good   CLINICAL DECISION MAKING: Evolving/moderate complexity   EVALUATION COMPLEXITY: Moderate     GOALS: Goals reviewed with patient? Yes   SHORT TERM GOALS: Target date: 01/13/2022   Patient to demonstrate independence in HEP  Baseline:Access Code: [email or web address] Goal status: Semi compliant due to pain, ongoing   2.  Decrease R piriformis tenderness to 4/10 Baseline: 8/10; 02/24/22 Pain/tenderness 9/10 at worst 03-25-22  0/10 Goal status: MET   LONG TERM GOALS:  Target date: 7/61/5183 recert. target date 04-26-22   Decrease 5x STS time to 10s Baseline: 12s; 02/24/22 28s arms crossed Goal status: Regressing, ongoing   2.  Increase R LE abd, flexion and extension to 4+/5 Baseline: 4/5; 02/24/22 Unchanged Goal status: Ongoing   3.  Increase FOTO score to 49 Baseline: 41; 02/24/22 41 Goal status: Ongoing         PLAN: PT FREQUENCY: 2x/week   PT DURATION: 4 weeks   PLANNED INTERVENTIONS: Therapeutic exercises, Therapeutic activity, Neuromuscular re-education, Balance training, Gait training, Patient/Family education, Joint mobilization, Stair training, aquatics and Manual therapy   PLAN FOR NEXT SESSION: HEP review and update, aerobic work, R hip strength and stretch, R LE strength and stretch, schedule aquatics  Voncille Lo, PT, [facility] Certified Exercise Expert for the Aging Adult  03/25/22 3:35 PM Phone: [phone] Fax: [phone]

## 2022-03-25 ENCOUNTER — Ambulatory Visit: Payer: Commercial Managed Care - HMO | Admitting: Physical Therapy

## 2022-03-25 ENCOUNTER — Encounter: Payer: Self-pay | Admitting: Physical Therapy

## 2022-03-25 DIAGNOSIS — M7061 Trochanteric bursitis, right hip: Secondary | ICD-10-CM | POA: Diagnosis not present

## 2022-03-25 DIAGNOSIS — R2681 Unsteadiness on feet: Secondary | ICD-10-CM

## 2022-03-25 DIAGNOSIS — M6281 Muscle weakness (generalized): Secondary | ICD-10-CM

## 2022-03-27 ENCOUNTER — Ambulatory Visit: Payer: Commercial Managed Care - HMO

## 2022-03-28 LAB — RHEUMATOID FACTOR: Rheumatoid fact SerPl-aCnc: <14 IU/mL (ref <14)

## 2022-03-28 LAB — C-REACTIVE PROTEIN: CRP: 2.3 mg/L (ref <8.0)

## 2022-03-28 LAB — SEDIMENTATION RATE: Sed Rate: 19 mm/h (ref 0–30)

## 2022-03-28 LAB — CYCLIC CITRUL PEPTIDE ANTIBODY, IGG: Cyclic Citrullin Peptide Ab: <16 UNITS

## 2022-03-28 LAB — 14-3-3 ETA PROTEIN: 14-3-3 eta Protein: <0.2 ng/mL (ref <0.2)

## 2022-03-30 NOTE — Progress Notes (Signed)
14-3-3 eta negative.

## 2022-04-01 ENCOUNTER — Ambulatory Visit: Payer: Commercial Managed Care - HMO | Admitting: Physical Therapy

## 2022-04-01 ENCOUNTER — Other Ambulatory Visit: Payer: Self-pay

## 2022-04-01 DIAGNOSIS — E1165 Type 2 diabetes mellitus with hyperglycemia: Secondary | ICD-10-CM

## 2022-04-01 MED ORDER — OZEMPIC (0.25 OR 0.5 MG/DOSE) 2 MG/3ML ~~LOC~~ SOPN: 0.5000 mg | PEN_INJECTOR | SUBCUTANEOUS | 3 refills | Status: DC

## 2022-04-03 ENCOUNTER — Ambulatory Visit: Payer: Commercial Managed Care - HMO | Attending: Rheumatology

## 2022-04-03 DIAGNOSIS — R2681 Unsteadiness on feet: Secondary | ICD-10-CM | POA: Insufficient documentation

## 2022-04-03 DIAGNOSIS — M6281 Muscle weakness (generalized): Secondary | ICD-10-CM | POA: Insufficient documentation

## 2022-04-03 DIAGNOSIS — M7061 Trochanteric bursitis, right hip: Secondary | ICD-10-CM | POA: Insufficient documentation

## 2022-04-04 ENCOUNTER — Telehealth: Payer: Self-pay

## 2022-04-04 NOTE — Telephone Encounter (Signed)
LVM regarding missed appointment and confirmed next appointment date/time.  1st no-show  [name], [place] 04/04/22 10:09 AM

## 2022-04-06 ENCOUNTER — Other Ambulatory Visit: Payer: Self-pay

## 2022-04-06 MED ORDER — METFORMIN HCL ER 500 MG PO TB24: ORAL_TABLET | ORAL | 0 refills | Status: DC

## 2022-04-08 ENCOUNTER — Ambulatory Visit: Payer: Commercial Managed Care - HMO | Admitting: Physical Therapy

## 2022-04-10 ENCOUNTER — Ambulatory Visit: Payer: Commercial Managed Care - HMO

## 2022-04-13 ENCOUNTER — Ambulatory Visit: Payer: Commercial Managed Care - HMO

## 2022-04-13 DIAGNOSIS — M7061 Trochanteric bursitis, right hip: Secondary | ICD-10-CM

## 2022-04-13 DIAGNOSIS — R2681 Unsteadiness on feet: Secondary | ICD-10-CM

## 2022-04-13 DIAGNOSIS — M6281 Muscle weakness (generalized): Secondary | ICD-10-CM | POA: Diagnosis present

## 2022-04-13 NOTE — Therapy (Signed)
OUTPATIENT PHYSICAL THERAPY TREATMENT NOTE   Patient Name: [name] MRN: [id number] DOB:1971-07-21, 51 y.o., female Today's Date: 04/13/2022  PCP: Meghan Smoker, MD REFERRING PROVIDER: Bo Merino, MD  END OF SESSION:    PT End of Session - 04/13/22 1622     Visit Number 9    Number of Visits 9    Date for PT Re-Evaluation 04/29/22    Authorization Type [employer]    PT Start Time 1615    PT Stop Time 1645    PT Time Calculation (min) 30 min    Activity Tolerance Patient tolerated treatment well    Behavior During Therapy [facility] for tasks assessed/performed                  Past Medical History:  Diagnosis Date   Anxiety    Arthritis    fingers   Cervical dysplasia    Coronary artery disease    Depression    Diabetes mellitus    Type II   Dyspnea 11/22/2013   GERD (gastroesophageal reflux disease)    High cholesterol    History of kidney stones    passed   Hypertension    no meds now   Kidney stones    Moderate episode of recurrent major depressive disorder ([name]) 12/28/2019   Neuropathy    feet and legs   Nuclear sclerotic cataract of both eyes 09/2019   Dr. Idolina Deleon   PONV (postoperative nausea and vomiting)    one time   Sleep apnea    lost 70lbs no cpap x83yr now   Stage 3a chronic kidney disease ([place] 04/29/2020   Past Surgical History:  Procedure Laterality Date   CARDIAC CATHETERIZATION     4-544yrago   CERVICAL CONIZATION W/BX N/A 05/04/2018   Procedure: CONIZATION CERVIX WITH BIOPSY - COLD KNIFE;  Surgeon: ErChancy MilroyMD;  [facility] Service: Gynecology;  Laterality: N/A;   CHOLECYSTECTOMY     CORONARY ARTERY BYPASS GRAFT N/A 10/24/2019   Procedure: CORONARY ARTERY BYPASS GRAFTING (CABG) times four, using left radial artery harvested endoscopically and right greater saphenous vein harvested endoscopically.;  Surgeon: VaIvin PootMD;  [facility]: [facility] Service: Open Heart Surgery;   Laterality: N/A;   ELBOW FRACTURE SURGERY Left    ELBOW SURGERY Right    ENDOMETRIAL ABLATION     6 years ago   INCISION AND DRAINAGE     for boil- buttocks   INCISION AND DRAINAGE Left    Leg, Nephrotoxin fasciotimy   IRRIGATION AND DEBRIDEMENT ABSCESS Right 03/17/2017   Procedure: IRRIGATION AND DEBRIDEMENT RIGHT THIGH ABSCESS;  Surgeon: GrMichael BostonMD;  Location: [facility];  Service: General;  Laterality: Right;   LAPAROSCOPIC APPENDECTOMY  10/04/2011   Procedure: APPENDECTOMY LAPAROSCOPIC;  Surgeon: BrJudieth KeensDO;  Location: [facility];  Service: General;  Laterality: N/A;   LEFT HEART CATH AND CORONARY ANGIOGRAPHY N/A 10/06/2019   Procedure: LEFT HEART CATH AND CORONARY ANGIOGRAPHY;  Surgeon: JoMartiniquePeter M, MD;  [facility];  Service: Cardiovascular;  Laterality: N/A;   RADIAL ARTERY HARVEST Left 10/24/2019   Procedure: Radial Artery Harvest;  Surgeon: VaIvin PootMD;  [facility] [place] Service: Open Heart Surgery;  Laterality: Left;   TEE WITHOUT CARDIOVERSION N/A 10/24/2019   Procedure: TRANSESOPHAGEAL ECHOCARDIOGRAM (TEE);  Surgeon: VaPrescott GumPeCollier SalinaMD;  [place] [facility] Service: Open Heart Surgery;  Laterality: N/A;   TENDON RECONSTRUCTION  Right 07/02/2021   Procedure: Right wrist 6 dorsal compartment tenosynovectomy and tendon repair Right elbow lateral epicondylar debridement and tendon repair;  Surgeon: Iran Planas, MD;  Location: [place];  Service: Orthopedics;  Laterality: Right;   WRIST FRACTURE SURGERY Left    WRIST SURGERY Right    Patient Active Problem List   Diagnosis Date Noted   Polyneuropathy associated with underlying disease ([place]) 07/30/2021   Dehydration 12/27/2020   Type 2 diabetes mellitus with hyperglycemia, with long-term current use of insulin ([place]) 11/11/2020   Stage 3a chronic kidney disease ([place]) 04/29/2020   COVID-19 virus vaccination declined 04/29/2020   Influenza vaccination declined 04/29/2020   Midline sternotomy  scar 04/17/2020   Left rib fracture 03/13/2020   Moderate episode of recurrent major depressive disorder ([place]) 12/28/2019   Encounter for post surgical wound check 12/06/2019   Type 2 diabetes mellitus with diabetic polyneuropathy, with long-term current use of insulin ([place]) 11/16/2019   Diabetes mellitus ([place]) 11/16/2019   Type 2 diabetes mellitus with retinopathy, with long-term current use of insulin ([place]) 11/16/2019   Dyslipidemia 11/16/2019   Postop check 11/15/2019   Diabetic retinopathy ([place]) 10/25/2019   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   Angina pectoris ([place]) 10/06/2019   Left-sided chest pain 06/13/2019   Post-operative state 06/06/2018   HGSIL (high grade squamous intraepithelial lesion) on Pap smear of cervix 02/25/2018   Right upper quadrant abdominal pain 12/30/2017   Polyarthritis 12/30/2017   OSA (obstructive sleep apnea) 11/22/2013   Dyspnea 11/22/2013   Uncontrolled type 2 diabetes mellitus with hyperosmolar nonketotic hyperglycemia ([place]) 07/21/2012   HTN (hypertension) 07/21/2012   Deleon 07/21/2012    REFERRING DIAG: M70.61,M70.62 (ICD-10-CM) - Trochanteric bursitis of both hips R29.6 (ICD-10-CM) - Frequent falls   THERAPY DIAG: Trochanteric bursitis of R hip, frequent falls  Rationale for Evaluation and Treatment Rehabilitation  PERTINENT HISTORY: History of Present Illness: [name] is a 51 y.o. female with history of osteoarthritis.  She gradually recovered from the fall 2022.  She fractured her left wrist and left elbow.  She also had surgery on her right wrist and right elbow due to torn tendon.  She states she went through physical therapy which was helpful.  She continues to have intermittent discomfort in her hands.  She reports intermittent swelling in her wrist and hands.  She also gets fluid retention in her lower extremities.  She has been experiencing pain in her right hip which she describes over the trochanteric area.  The left knee joint and left  shoulder joint pain resolved.  She has had frequent falls in the past.  She has never had physical therapy for fall prevention and lower extremity muscle strengthening.  She has been going to the gym for the last 2 months.  She has been doing aerobic exercises on the elliptical and treadmill.  PRECAUTIONS: fall  SUBJECTIVE: Patient reports soreness after last session. She reports when she gets into her car based on how she gets in, that this is hurting her hip  PAIN:  Are you having pain? Yes: NPRS scale: 1/10 Pain location: R hip  Pain description: ache Aggravating factors: pressure Relieving factors: position change   OBJECTIVE: (objective measures completed at initial evaluation unless otherwise dated)   OBJECTIVE:    DIAGNOSTIC FINDINGS: Visit Diagnoses: Primary osteoarthritis of both hands-she complains of pain and discomfort in her bilateral hands.  She had bilateral PIP and DIP thickening.  An exercise was emphasized.   Trochanteric bursitis of  both hips-she has been having tenderness in her trochanteric area intermittently.  Her right trochanteric bursa was more tender.  She also had tenderness over her anterior superior iliac spine.  I will refer her to physical therapy.   Chronic pain of left knee -the pain in her left knee resolved.  X-rays obtained were unremarkable.   Primary osteoarthritis of both feet - X-rays were consistent with osteoarthritis. All autoimmune work-up was negative.   Frequent falls-patient had a fall in May 2022.  She has had several falls before.  She has not had physical therapy for fall prevention and lower extremity muscle strengthening.  I will refer her to physical therapy.   PATIENT SURVEYS:  FOTO 41(49 predicted); 04/13/22 79   COGNITION:           Overall cognitive status: Within functional limits for tasks assessed                          SENSATION: Not tested   MUSCLE LENGTH: Hamstrings: Right 45 deg;   POSTURE:  Anterior PT    PALPATION: TTP R piriformis    LE ROM:   A/PROM Right 12/30/2021 Left 12/30/2021 R 02/24/22  Hip flexion     120d P  Hip extension     10d P  Hip abduction       Hip adduction       Hip internal rotation       Hip external rotation       Knee flexion       Knee extension       Ankle dorsiflexion       Ankle plantarflexion       Ankle inversion       Ankle eversion        (Blank rows = not tested)   LE MMT:   MMT Right 12/30/2021 Left 12/30/2021 R 02/24/22  Hip flexion _0 Hip extension _1 Hip abduction _2 Hip adduction       Hip internal rotation       Hip external rotation       Knee flexion       Knee extension _3 Ankle dorsiflexion _4 Ankle plantarflexion       Ankle inversion       Ankle eversion        (Blank rows = not tested)   LOWER EXTREMITY SPECIAL TESTS:  Hip special tests: Saralyn Pilar (FABER) test: negative, Thomas test: negative, and Piriformis test: positive  02/24/22 positive FABERS and FADIRS   FUNCTIONAL TESTS:  5 times sit to stand: 12s; 02/24/22 28s   GAIT: Distance walked: 28f x2 Assistive device utilized: None Level of assistance: Complete Independence         [facility]                                                DATE: 04/13/22 Therapeutic Exercise: See revised HEP   [facility] Adult PT Treatment:  DATE: 03-25-22 Aquatic therapy at [address] - therapeutic pool temp 92 degrees Treatment took place in water 3.8 to  4 ft 8 in.feet deep depending upon activity.  PT enters  without AD and independently. Pt entered and exited the pool holding onto hand rail. Pt pain level 0/10 at initiation of water walking.   Aquatic Exercise Walking forward/backward/side stepping with yellow dumbbells Side squat walk with yellow DB shoulder abd/add x2 laps BIL Heel/toe raises x20 with 5 lb wt SL heel raise R 20/20 (burning at 11/20  then 25/25   no burning.  L 20 x and 25 x  no burning Marching hip flexion with knee flexion with 5lb wt BIL x20 Hip abd/add BIL x 20 5lb wt  Hip IR/ER with hip flex 90 degrees x20  5 lb weight. Squats 2 x 20 reps SLS and kicking forward ,side and backward for balance  on R and then L with 1 UE support Submerged step ups x10 BIL forward/lateral Submerged step ups 2 x 10 R and L with 5 lb wt each LE Gastroc stretch with great toe against pool wall on R and L 30" each x 1 and then 2nd stretch with R foot behind Runners stretch x30" BIL Hamstring stretch x30" BIL Figure 4 squat stretch x45" BIL Lunge walk  with 5 lb weights 6 lengths of pool STS from third step from bottom x10 no UE support Using yellow DB and bicycle and scissor  and jumping jacks in deeper water x 1 ' each Treading water 3 min Supine abdominal hollowing with VC to keep hips up and feet up with  yellow barbells in UE submerged Swimming strokes  side stroke, breast stroke, back stroke and free style for last 10 min of pool time continuous  Pt requires the buoyancy of water for active assisted exercises with buoyancy supported for strengthening and AROM exercises. Hydrostatic pressure also supports joints by unweighting joint load by at least 50 % in 3-4 feet depth water. 80% in chest to neck deep water. Water will provide assistance with movement using the current and laminar flow while the buoyancy reduces weight bearing. Pt requires the viscosity of the water for resistance with strengthening exercises   [facility] Adult PT Treatment:                                                DATE: 03/19/2022 Aquatic therapy at [address] - therapeutic pool temp 92 degrees Treatment took place in water 3.8 to  4 ft 8 in.feet deep depending upon activity.  PT enters  without AD and independently. Pt entered and exited the pool holding onto hand rail forward down steps and not sideways Pt pain level 1/10 at initiation of water walking.    Aquatic Exercise Walking forward/backward/side stepping with yellow dumbbells Marching hip flexion with knee flexion BIL x20 Hip abd/add BIL x20 Hip ext/flex with knee straight x10 Hip IR/ER with hip flex 90 degrees x10 SLS and kicking forward ,side and backward for balance  on R and then L with 1 UE support Submerged step ups x10 BIL forward/lateral Step ups with knee drive X21 BIL Heel/toe raises x20 Gastroc stretch with great toe against pool wall on R and L 30" each x 1 and then 2nd stretch with R foot behind Runners stretch x30" BIL Hamstring  stretch x30" BIL Figure 4 squat stretch x45" BIL Using yellow DB and bicycle and scissor in deeper water x1' each Squats 2 x 20 reps Lunge walk forwards with yellow DB by sides x 2 laps Side squat walk with yellow DB shoulder abd/add x2 laps STS from third step from bottom x10 no UE support  Pt requires the buoyancy of water for active assisted exercises with buoyancy supported for strengthening and AROM exercises. Hydrostatic pressure also supports joints by unweighting joint load by at least 50 % in 3-4 feet depth water. 80% in chest to neck deep water. Water will provide assistance with movement using the current and laminar flow while the buoyancy reduces weight bearing. Pt requires the viscosity of the water for resistance with strengthening exercises  [facility] Adult PT Treatment:                                                DATE: 03-18-22 Aquatic therapy at [address] - therapeutic pool temp 92 degrees Treatment took place in water 3.8 to  4 ft 8 in.feet deep depending upon activity.  PT enters  without AD and independently. Pt entered and exited the pool holding onto hand rail forward down steps and not sideways Pt pain level 0/10 at initiation of water walking.   Aquatic Exercise  Marching hip flexion with knee flexion BIL Hip abd/add BIL Hip ext/ knee flex with knee straight  Hip IR/ER with hip flex 90  degrees SLS and kicking forward ,side and backward for balance  on R and then L with 1 UE support and Submerged step 10 x on R and L  without UE support Gastroc stretch with great toe against pool wall on R and L 30" each x 1 and then 2nd stretch with R foot behind Runners stretch x30" BIL Hamstring stretch x30" BIL Using yellow DB and bicycle and scissor  in deeper water Squats 2 x 20 reps Lunge with target 10 x on R and then L Up and down steps ( 5) 5 x forward without compensation. Pt with sore quad and received PT assisted standing quad stretch with overpressure in order to decrease tightness on descending steps Supine abdominal hollowing with VC to keep hips up and feet up with  yellow barbells in UE submerged Plank with yellow DB and hip abd/posterior chain   Walking forward/sidestepping  and backward  utilizing yellow aquatic DB.   Gave handout on [employer] water classes  Pt with minimal soreness behind greater trochanter on R . Received Aqua stretch in same area with decreased tissue tension post RX.    Pt requires the buoyancy of water for active assisted exercises with buoyancy supported for strengthening and AROM exercises. Hydrostatic pressure also supports joints by unweighting joint load by at least 50 % in 3-4 feet depth water. 80% in chest to neck deep water. Water will provide assistance with movement using the current and laminar flow while the buoyancy reduces weight bearing. Pt requires the viscosity of the water for resistance with strengthening exercises  [facility] Adult PT Treatment:  DATE: 03-11-22 Aquatic therapy at [address] - therapeutic pool temp 92 degrees Treatment took place in water 3.8 to  4 ft 8 in.feet deep depending upon activity.  PT enters  without AD and independently. Pt entered and exited the pool holding onto hand rail. Pt pain level 0/10 at initiation of water walking.   Aquatic Exercise  Pt  educated on neutral posture and hip hinging in seated position with water at chest level x 10 with stretch to low back/abdominal engagement and then x 10 with back at pool wall at external cue, VC for neck tucked to prevent hyperextension. Walking forward/sidestepping  and backward , Pt with decreased balance in water walking backwards SLS and kicking forward ,side and backward for balance  on R and then L Marching hip extension with knee flexion BIL Hip abd/add BIL Hip ext/ knee flex with knee straight  Hip IR/ER with hip flex 90 degrees Submerged step 10 x on R and L  without UE support Runners stretch x30" BIL Hamstring stretch x30" BIL Using yellow DB and bicycle and scissor  in deeper water Squats 2 x 20 reps Lunge with target 10 x on R and then L Supine abdominal hollowing with VC to keep hips up and feet up with  yellow barbells in UE submerged Plank wit yellow DB and hip abd/posterior chain Side to side tuck and extension for core work Pt with soreness behind greater trochanter on R . Received Aqua stretch in same area with decreased tissue tension post RX.  Pt is " sore" but feels fine post Aquatics  Pt requires the buoyancy of water for active assisted exercises with buoyancy supported for strengthening and AROM exercises. Hydrostatic pressure also supports joints by unweighting joint load by at least 50 % in 3-4 feet depth water. 80% in chest to neck deep water. Water will provide assistance with movement using the current and laminar flow while the buoyancy reduces weight bearing. Pt requires the viscosity of the water for resistance with strengthening exercises    PATIENT EDUCATION:  Education details: Discussed eval findings, rehab rationale and POC and patient is in agreement . [employer] water class schedule given post DC Person educated: Patient Education method: Explanation, Demonstration, and Handouts Education comprehension: verbalized understanding and needs further  education     HOME EXERCISE PROGRAM: Access Code: [email or web address] URL: [email or web address] Date: 04/13/2022 Prepared by: Sharlynn Oliphant PT 04/13/22  Exercises - Clamshell  - 2 x daily - 7 x weekly - 2 sets - 15 reps - Hooklying Single Leg Bent Knee Fallouts with Resistance  - 2 x daily - 7 x weekly - 2 sets - 15 reps - Figure 4 Bridge  - 2 x daily - 7 x weekly - 2 sets - 15 reps - Supine March with Posterior Pelvic Tilt  - 2 x daily - 7 x weekly - 2 sets - 10 reps   ASSESSMENT:   CLINICAL IMPRESSION: All rehab goals met, patient ready for independent management  OBJECTIVE IMPAIRMENTS decreased activity tolerance, decreased knowledge of condition, decreased mobility, decreased ROM, decreased strength, increased muscle spasms, impaired flexibility, postural dysfunction, and pain.    ACTIVITY LIMITATIONS community activity, driving, occupation, and squatting .      REHAB POTENTIAL: Good   CLINICAL DECISION MAKING: Evolving/moderate complexity   EVALUATION COMPLEXITY: Moderate     GOALS: Goals reviewed with patient? Yes   SHORT TERM GOALS: Target date: 01/13/2022   Patient to demonstrate independence in HEP  Baseline:Access  Code: [email or web address] Goal status:Met   2.  Decrease R piriformis tenderness to 4/10 Baseline: 8/10; 02/24/22 Pain/tenderness 9/10 at worst 03-25-22  0/10 Goal status: MET   LONG TERM GOALS: Target date: 6/81/2751 recert. target date 04-26-22   Decrease 5x STS time to 10s Baseline: 12s; 02/24/22 28s arms crossed; 04/13/22 10s Goal status: Met   2.  Increase R LE abd, flexion and extension to 4+/5 Baseline: 4/5; 02/24/22 Unchanged; 04/13/22 strength 4+/5 throughout  Goal status: Met   3.  Increase FOTO score to 49 Baseline: 41; 02/24/22 41; 04/13/22 79  Goal status: Met         PLAN: PT FREQUENCY: 2x/week   PT DURATION: 4 weeks   PLANNED INTERVENTIONS: Therapeutic exercises, Therapeutic activity, Neuromuscular re-education, Balance training,  Gait training, Patient/Family education, Joint mobilization, Stair training, aquatics and Manual therapy   PLAN FOR NEXT SESSION: DC to [facility]  04/13/22 4:52 PM Phone: [phone] Fax: [phone]

## 2022-04-17 ENCOUNTER — Encounter: Payer: Self-pay | Admitting: Internal Medicine

## 2022-04-17 ENCOUNTER — Ambulatory Visit (INDEPENDENT_AMBULATORY_CARE_PROVIDER_SITE_OTHER): Payer: Commercial Managed Care - HMO | Admitting: Internal Medicine

## 2022-04-17 VITALS — BP 120/80 | HR 67 | Ht 68.5 in | Wt 296.0 lb

## 2022-04-17 DIAGNOSIS — Z794 Long term (current) use of insulin: Secondary | ICD-10-CM | POA: Diagnosis not present

## 2022-04-17 DIAGNOSIS — E1165 Type 2 diabetes mellitus with hyperglycemia: Secondary | ICD-10-CM | POA: Diagnosis not present

## 2022-04-17 LAB — POCT GLYCOSYLATED HEMOGLOBIN (HGB A1C): Hemoglobin A1C: 10.3 % — AB (ref 4.0–5.6)

## 2022-04-17 MED ORDER — OMNIPOD 5 DEXG7G6 PODS GEN 5 MISC: 1.0000 | 3 refills | Status: DC

## 2022-04-17 MED ORDER — DEXCOM G6 SENSOR MISC: 1.0000 | 3 refills | Status: DC

## 2022-04-17 MED ORDER — OMNIPOD 5 DEXG7G6 INTRO GEN 5 KIT: 1.0000 | PACK | 0 refills | Status: DC

## 2022-04-17 MED ORDER — DEXCOM G6 TRANSMITTER MISC: 3 refills | Status: DC

## 2022-04-17 MED ORDER — INSULIN LISPRO 100 UNIT/ML IJ SOLN: INTRAMUSCULAR | 3 refills | Status: DC

## 2022-04-17 NOTE — Progress Notes (Signed)
Name: [name]  Age/ Sex: 51 y.o., female   MRN/ DOB: [id number], 1970/09/10     PCP: [name], MD   Reason for Endocrinology Evaluation: Type 2 Diabetes Mellitus  Initial Endocrine Consultative Visit: 11/16/2019    PATIENT IDENTIFIER: [name] is a 51 y.o. female with a past medical history of CAD, T2DM and HTN. The patient has followed with Endocrinology clinic since 11/16/2019 for consultative assistance with management of her diabetes.  DIABETIC HISTORY:  Ms. Previti was diagnosed with DM in 2004. She is intolerant to higher doses of metformin, nor to Trulicity/ Victoza  due to diarrhea. She was able to tolerate Victoza which was started 05/2019. Her hemoglobin A1c has ranged from 7.2% in 2013, peaking at >15.0% in 2019.    On her initial visit to our clinic she had an A1c of 15.1% . She was on Levemir, Metformin  And victoza and we added [place].   Victoza was stopped 05/2020 due to "severe nausea"  She was started on the Omnipod 07/2020   Victoza stopped 05/2020 due to nausea and diarrhea  Metformin reduced by 50% due to GI issues     Switched Gabapentin to Lyrica 10/2020 SUBJECTIVE:   During the last visit (11/27/2021): A1c 7.7  %.We  continued  metformin and Jardiance and adjusted pump setting       Today (04/17/2022): [name] is here for a follow up on diabetes management.  She checks her blood sugars multiple times a day through the Dexcom   She did not bring her pump today , hence unable to download Continues with Lyrica  Denies nausea, vomiting or diarrhea   Follows with nephrology  She is undergoing physical therapy through rheumatology She presented to the ED with chest pain in April 2023  Ozempic is costing her $400 for 3 month supply  Had to put her dog down this week   This patient with type 2 diabetes is treated with Omnipod (insulin pump).     Pump and meter download:     Pump   Omnipod Settings   Insulin type    NOVOLOG    Basal rate       0000 2.4 u/h    0600 2.15          I:C ratio       0000 1:8                  Sensitivity       0000  20      Goal       0000  110-120       Type & Model of Pump: Omnipod Insulin Type: Currently using Novolog    PUMP STATISTICS: No data       HOME DIABETES REGIMEN:  Metformin 500 mg XR , 2 tablet with breakfast  Farxiga 10 mg daily  Ozempic 0.25 mg weekly     Statin: Yes ACE-I/ARB: no     CONTINUOUS GLUCOSE MONITORING RECORD INTERPRETATION    Dates of Recording: 8/5-8/18/2023  Sensor description:Dexcom  Results statistics:   CGM use % of time 71  Average and SD 275/91  Time in range 17%  % Time Above 180 24  % Time above 250 58  % Time Below target <1     Glycemic patterns summary: Hyperglycemia noted all day and night   Hyperglycemic episodes  postprandial   Hypoglycemic episodes occurred  during the day after a bolus  Overnight periods: high       DIABETIC COMPLICATIONS: Microvascular complications:  Neuropathy, retinopathy Denies: CKD Last Eye Exam: Completed   Macrovascular complications:  CAD ( S/P CABG 10/2019) Denies:  CVA, PVD   HISTORY:  Past Medical History:  Past Medical History:  Diagnosis Date   Anxiety    Arthritis    fingers   Cervical dysplasia    Coronary artery disease    Depression    Diabetes mellitus    Type II   Dyspnea 11/22/2013   GERD (gastroesophageal reflux disease)    High cholesterol    History of kidney stones    passed   Hypertension    no meds now   Kidney stones    Moderate episode of recurrent major depressive disorder (HCC) 12/28/2019   Neuropathy    feet and legs   Nuclear sclerotic cataract of both eyes 09/2019   Dr. Shea Evans   PONV (postoperative nausea and vomiting)    one time   Sleep apnea    lost 70lbs no cpap x58yrs now   Stage 3a chronic kidney disease (HCC) 04/29/2020   Past Surgical History:  Past Surgical History:  Procedure  Laterality Date   CARDIAC CATHETERIZATION     4-86yrs ago   CERVICAL CONIZATION W/BX N/A 05/04/2018   Procedure: CONIZATION CERVIX WITH BIOPSY - COLD KNIFE;  Surgeon: Hermina Staggers, MD;  Location: [facility];  Service: Gynecology;  Laterality: N/A;   CHOLECYSTECTOMY     CORONARY ARTERY BYPASS GRAFT N/A 10/24/2019   Procedure: CORONARY ARTERY BYPASS GRAFTING (CABG) times four, using left radial artery harvested endoscopically and right greater saphenous vein harvested endoscopically.;  Surgeon: Kerin Perna, MD;  Location: [facility];  Service: Open Heart Surgery;  Laterality: N/A;   ELBOW FRACTURE SURGERY Left    ELBOW SURGERY Right    ENDOMETRIAL ABLATION     6 years ago   INCISION AND DRAINAGE     for boil- buttocks   INCISION AND DRAINAGE Left    Leg, Nephrotoxin fasciotimy   IRRIGATION AND DEBRIDEMENT ABSCESS Right 03/17/2017   Procedure: IRRIGATION AND DEBRIDEMENT RIGHT THIGH ABSCESS;  Surgeon: Karie Soda, MD;  Location: [facility];  Service: General;  Laterality: Right;   LAPAROSCOPIC APPENDECTOMY  10/04/2011   Procedure: APPENDECTOMY LAPAROSCOPIC;  Surgeon: Rulon Abide, DO;  Location: [facility];  Service: General;  Laterality: N/A;   LEFT HEART CATH AND CORONARY ANGIOGRAPHY N/A 10/06/2019   Procedure: LEFT HEART CATH AND CORONARY ANGIOGRAPHY;  Surgeon: Swaziland, Peter M, MD;  Location: [facility];  Service: Cardiovascular;  Laterality: N/A;   RADIAL ARTERY HARVEST Left 10/24/2019   Procedure: Radial Artery Harvest;  Surgeon: Kerin Perna, MD;  Location: [facility] OR;  Service: Open Heart Surgery;  Laterality: Left;   TEE WITHOUT CARDIOVERSION N/A 10/24/2019   Procedure: TRANSESOPHAGEAL ECHOCARDIOGRAM (TEE);  Surgeon: Donata Clay, Theron Arista, MD;  Location: [facility] OR;  Service: Open Heart Surgery;  Laterality: N/A;   TENDON RECONSTRUCTION Right 07/02/2021   Procedure: Right wrist 6 dorsal compartment tenosynovectomy and tendon repair Right elbow lateral epicondylar  debridement and tendon repair;  Surgeon: Bradly Bienenstock, MD;  Location: [facility] OR;  Service: Orthopedics;  Laterality: Right;   WRIST FRACTURE SURGERY Left    WRIST SURGERY Right    Social History:  reports that she quit smoking about 4 years ago. Her smoking use included e-cigarettes and cigarettes. She has a 48.00 pack-year smoking history. She has never been exposed to  tobacco smoke. She has never used smokeless tobacco. She reports current alcohol use. She reports that she does not use drugs. Family History:  Family History  Problem Relation Age of Onset   Aneurysm Mother    Pancreatic cancer Mother    Heart attack Father    Stroke Father    Heart disease Sister    Breast cancer Paternal Grandmother    Congestive Heart Failure Sister    Osteoarthritis Sister    Osteoarthritis Sister      HOME MEDICATIONS: Allergies as of 04/17/2022       Reactions   Sulfa Antibiotics Hives   Codeine Itching        Medication List        Accurate as of April 17, 2022  8:01 AM. If you have any questions, ask your nurse or doctor.          acetaminophen 500 MG tablet Commonly known as: TYLENOL Take 1,000 mg by mouth every 6 (six) hours as needed for moderate pain or headache.   albuterol 108 (90 Base) MCG/ACT inhaler Commonly known as: VENTOLIN HFA Inhale 1-2 puffs into the lungs every 6 (six) hours as needed for wheezing or shortness of breath.   atorvastatin 80 MG tablet Commonly known as: LIPITOR Take 1 tablet (80 mg total) by mouth daily. TAKE 1 TABLET(80 MG) BY MOUTH DAILY AT 6 PM   dapagliflozin propanediol 10 MG Tabs tablet Commonly known as: Farxiga Take 1 tablet (10 mg total) by mouth daily before breakfast.   Dexcom G6 Receiver Devi Use as instructed to check blood sugar daily   Dexcom G6 Sensor Misc USE 1 DEVICE AS DIRECTED   Dexcom G6 Transmitter Misc USE AS DIRECTED   docusate sodium 100 MG capsule Commonly known as: Colace Take 1 capsule (100 mg total) by  mouth 2 (two) times daily.   esomeprazole 20 MG capsule Commonly known as: NEXIUM Take 20 mg by mouth daily.   ezetimibe 10 MG tablet Commonly known as: ZETIA Take 1 tablet (10 mg total) by mouth every evening.   FLUoxetine 40 MG capsule Commonly known as: PROZAC TAKE 1 CAPSULE(40 MG) BY MOUTH DAILY What changed:  how much to take how to take this when to take this   fluticasone 50 MCG/ACT nasal spray Commonly known as: FLONASE Place 2 sprays into both nostrils daily as needed for allergies or rhinitis.   furosemide 20 MG tablet Commonly known as: LASIX Take 1 tablet (20 mg total) by mouth daily.   insulin lispro 100 UNIT/ML injection Commonly known as: HumaLOG INJECT A MAX OF 60 UNITS PER PUMP AS DIRECTED   Insulin Syringes (Disposable) U-100 1 ML Misc 1 Device by Does not apply route as directed.   Lagevrio 200 MG Caps capsule Generic drug: molnupiravir EUA   losartan 50 MG tablet Commonly known as: COZAAR Take 1 tablet (50 mg total) by mouth daily.   Melatonin 10 MG Caps Take 10 mg by mouth at bedtime as needed (sleep).   metFORMIN 500 MG 24 hr tablet Commonly known as: GLUCOPHAGE-XR TAKE 2 TABLETS(1000 MG) BY MOUTH DAILY WITH BREAKFAST   metoprolol succinate 25 MG 24 hr tablet Commonly known as: TOPROL-XL TAKE 1/2 TABLET BY MOUTH EVERY DAY   nitroGLYCERIN 0.4 MG SL tablet Commonly known as: NITROSTAT Place 1 tablet (0.4 mg total) under the tongue every 5 (five) minutes as needed for chest pain.   NovoLOG 100 UNIT/ML injection Generic drug: insulin aspart INJECT A MAX OF 60  UNITS PER PUMP AS DIRECTED   Omnipod DASH Pods (Gen 4) Misc REPLACE POD EVERY 72 HOURS AS DIRECTED   Ozempic (0.25 or 0.5 MG/DOSE) 2 MG/3ML Sopn Generic drug: Semaglutide(0.25 or 0.5MG /DOS) Inject 0.5 mg into the skin once a week.   pregabalin 300 MG capsule Commonly known as: LYRICA Take 1 capsule (300 mg total) by mouth 2 (two) times daily.   TRUEplus Safety Lancets 28G  Misc Use as directed         OBJECTIVE:   Vital Signs: BP 120/80 (BP Location: Left Arm, Patient Position: Sitting, Cuff Size: Large)   Pulse 67   Ht 5' 8.5" (1.74 m)   Wt 296 lb (134.3 kg)   LMP  (LMP Unknown)   SpO2 97%   BMI 44.35 kg/m     Wt Readings from Last 3 Encounters:  04/17/22 296 lb (134.3 kg)  03/11/22 (!) 304 lb 6.4 oz (138.1 kg)  12/03/21 (!) 301 lb 3.2 oz (136.6 kg)     Exam: General: Pt appears well and is in NAD  Neck: General: Supple without adenopathy. Thyroid: Thyroid size normal.  No goiter or nodules appreciated.  Lungs: Clear with good BS bilat with no rales, rhonchi, or wheezes  Heart: RRR   Extremities: No pretibial edema  Neuro: MS is good with appropriate affect, pt is alert and Ox3      DM foot exam: 04/17/2022   The skin of the feet is intact without sores or ulcerations. The pedal pulses are undetectable on today's exam  The sensation is normal to a screening 5.07, 10 gram monofilament bilaterally   DATA REVIEWED:  Lab Results  Component Value Date   HGBA1C 10.3 (A) 04/17/2022   HGBA1C 7.7 (A) 11/27/2021   HGBA1C 9.8 (A) 07/30/2021   Lab Results  Component Value Date   MICROALBUR <0.7 07/30/2021   LDLCALC 53 07/30/2021   CREATININE 1.47 (H) 12/09/2021   Lab Results  Component Value Date   MICRALBCREAT 2.2 07/30/2021     Lab Results  Component Value Date   CHOL 147 07/30/2021   HDL 68.70 07/30/2021   LDLCALC 53 07/30/2021   TRIG 128.0 07/30/2021   CHOLHDL 2 07/30/2021        Latest Reference Range & Units 07/30/21 12:13  Sodium 135 - 145 mEq/L 141  Potassium 3.5 - 5.1 mEq/L 4.4  Chloride 96 - 112 mEq/L 103  CO2 19 - 32 mEq/L 30  Glucose 70 - 99 mg/dL 127 (H)  BUN 6 - 23 mg/dL 23  Creatinine 0.40 - 1.20 mg/dL 1.42 (H)  Calcium 8.4 - 10.5 mg/dL 9.3  GFR >60.00 mL/min 42.55 (L)  Total CHOL/HDL Ratio  2  Cholesterol 0 - 200 mg/dL 147  HDL Cholesterol >39.00 mg/dL 68.70  LDL (calc) 0 - 99 mg/dL 53   MICROALB/CREAT RATIO 0.0 - 30.0 mg/g 2.2  NonHDL  78.53  Triglycerides 0.0 - 149.0 mg/dL 128.0  VLDL 0.0 - 40.0 mg/dL 25.6     ASSESSMENT / PLAN / RECOMMENDATIONS:   1) Type 2 Diabetes Mellitus, Sub-Optimally  controlled, With Neuropathic, retinopathic and macrovascular complications - Most recent A1c of 7.7 %. Goal A1c < 7.0 %.     -Pt with worsening glycemic control due to variable reasons.  - She has intolerance to Victoza and Trulicity but doing well with Ozempic except that its cost prohibitive  - Will upgrade pump to Omnipod 5 - Unable to download or make changes to the pump as she did not  bring the PDM   MEDICATIONS: - Continue  Ozempic 0.5 mg weekly - Continue Metformin 500 mg XR , 2 tablet with breakfast  - Continue Farxiga  10mg  , 1 tablet daily with Breakfast     Pump   Omnipod Settings   Insulin type   Novolog     Basal rate       0000-0600 2.4 u/h    0600-0000 2.15          I:C ratio       0000 1:8                  Sensitivity       0000  20      Goal       0000  110-120     EDUCATION / INSTRUCTIONS: BG monitoring instructions: Patient is instructed to check her blood sugars 4 times a day, preprandial and bedtime Call [facility] if: BG persistently < 70  I reviewed the Rule of 15 for the treatment of hypoglycemia in detail with the patient. Literature supplied.     2) Diabetic complications:  Eye: Does  have known diabetic retinopathy.  Neuro/ Feet: Does have known diabetic peripheral neuropathy .  Renal: Patient does not have known baseline CKD. She   is not on an ACEI/ARB at present.   3) Peripheral neuropathy   -Tolerating Lyrica Medication Continue Lyrica 300 twice daily  4) Dyslipidemia :   - Lipid panel at goal  Continue Atorvastatin 80 mg daily    F/U in 6 months    Signed electronically by: , MD  [facility] Endocrinology  [facility] [address] [place], [place] Phone: [phone] FAX: [phone]   CC: [phone], MD [address] [place] Phone: [phone]  Fax: [phone]  Return to Endocrinology clinic as below: No future appointments.

## 2022-04-17 NOTE — Patient Instructions (Signed)
-   Continue Ozempic 0.5 mg weekly , - Continue Metformin 500 mg XR , 2 tablet with breakfast  - Continue  Farxiga 10 mg , 1 tablet daily with Breakfast      Pump   Omnipod Settings   Insulin type   NOVOLOG    Basal rate       0000-0600 2.4 u/h    0600-0000 2.15          I:C ratio       0000 1:8                  Sensitivity       0000  20      Goal       0000  110-120     HOW TO TREAT LOW BLOOD SUGARS (Blood sugar LESS THAN 70 MG/DL) Please follow the RULE OF 15 for the treatment of hypoglycemia treatment (when your (blood sugars are less than 70 mg/dL)   STEP 1: Take 15 grams of carbohydrates when your blood sugar is low, which includes:  3-4 GLUCOSE TABS  OR 3-4 OZ OF JUICE OR REGULAR SODA OR ONE TUBE OF GLUCOSE GEL    STEP 2: RECHECK blood sugar in 15 MINUTES STEP 3: If your blood sugar is still low at the 15 minute recheck --> then, go back to STEP 1 and treat AGAIN with another 15 grams of carbohydrates.

## 2022-05-13 ENCOUNTER — Other Ambulatory Visit: Payer: Self-pay | Admitting: Cardiology

## 2022-05-22 ENCOUNTER — Other Ambulatory Visit: Payer: Self-pay | Admitting: Cardiology

## 2022-05-22 ENCOUNTER — Other Ambulatory Visit: Payer: Self-pay | Admitting: Internal Medicine

## 2022-05-25 ENCOUNTER — Encounter: Payer: Self-pay | Admitting: Cardiology

## 2022-05-26 ENCOUNTER — Telehealth: Payer: Self-pay | Admitting: *Deleted

## 2022-05-26 NOTE — Telephone Encounter (Signed)
   Name: [name]  DOB: May 21, 1971  MRN: [id number]  Primary Cardiologist: Donato Heinz, MD  Chart reviewed as part of pre-operative protocol coverage. Because of [name]'s past medical history and time since last visit, she will require a follow-up virtual or in-person visit in order to better assess preoperative cardiovascular risk.  Last seen 12/03/21 though ED visit 12/09/21 for chest pain and did not stay to be evaluated.   Pre-op covering staff: - Please schedule appointment and call patient to inform them. If patient already had an upcoming appointment within acceptable timeframe, please add "pre-op clearance" to the appointment notes so provider is aware. - Please contact requesting surgeon's office via preferred method (i.e, phone, fax) to inform them of need for appointment prior to surgery.   Loel Dubonnet, NP  05/26/2022, 12:57 PM

## 2022-05-26 NOTE — Telephone Encounter (Signed)
   Pre-operative Risk Assessment    Patient Name: [name]  DOB: Aug 28, 1971 MRN: [id number]      Request for Surgical Clearance    Procedure:   LEFT KNEE SCOPE WITH PLM  Date of Surgery:  Clearance TBD                                 Surgeon:  DR. Victorino December Surgeon's Group or Practice Name:  [facility] Phone number:  [phone] Fax number:  [phone] ATTN: KERRI MAZE   Type of Clearance Requested:   - Medical ; NO MEDICATIONS LISTED AS NEEDING TO BE HELD   Type of Anesthesia:   CHOICE   Additional requests/questions:    Jiles Prows   05/26/2022, 9:46 AM

## 2022-05-28 ENCOUNTER — Telehealth: Payer: Self-pay

## 2022-05-28 NOTE — Telephone Encounter (Signed)
  Patient Consent for Virtual Visit        [name] has provided verbal consent on 05/28/2022 for a virtual visit (video or telephone).   CONSENT FOR VIRTUAL VISIT FOR:  [name]  By participating in this virtual visit I agree to the following:  I hereby voluntarily request, consent and authorize [facility] and its employed or contracted physicians, physician assistants, nurse practitioners or other licensed health care professionals (the Practitioner), to provide me with telemedicine health care services (the "Services") as deemed necessary by the treating Practitioner. I acknowledge and consent to receive the Services by the Practitioner via telemedicine. I understand that the telemedicine visit will involve communicating with the Practitioner through live audiovisual communication technology and the disclosure of certain medical information by electronic transmission. I acknowledge that I have been given the opportunity to request an in-person assessment or other available alternative prior to the telemedicine visit and am voluntarily participating in the telemedicine visit.  I understand that I have the right to withhold or withdraw my consent to the use of telemedicine in the course of my care at any time, without affecting my right to future care or treatment, and that the Practitioner or I may terminate the telemedicine visit at any time. I understand that I have the right to inspect all information obtained and/or recorded in the course of the telemedicine visit and may receive copies of available information for a reasonable fee.  I understand that some of the potential risks of receiving the Services via telemedicine include:  Delay or interruption in medical evaluation due to technological equipment failure or disruption; Information transmitted may not be sufficient (e.g. poor resolution of images) to allow for appropriate medical decision making by the  Practitioner; and/or  In rare instances, security protocols could fail, causing a breach of personal health information.  Furthermore, I acknowledge that it is my responsibility to provide information about my medical history, conditions and care that is complete and accurate to the best of my ability. I acknowledge that Practitioner's advice, recommendations, and/or decision may be based on factors not within their control, such as incomplete or inaccurate data provided by me or distortions of diagnostic images or specimens that may result from electronic transmissions. I understand that the practice of medicine is not an exact science and that Practitioner makes no warranties or guarantees regarding treatment outcomes. I acknowledge that a copy of this consent can be made available to me via my patient portal ([facility]), or I can request a printed copy by calling the office of [facility].    I understand that my insurance will be billed for this visit.   I have read or had this consent read to me. I understand the contents of this consent, which adequately explains the benefits and risks of the Services being provided via telemedicine.  I have been provided ample opportunity to ask questions regarding this consent and the Services and have had my questions answered to my satisfaction. I give my informed consent for the services to be provided through the use of telemedicine in my medical care

## 2022-05-28 NOTE — Telephone Encounter (Signed)
Spoke with patient who is agreeable to do a tele visit on 10/3 @ 2:20 p. Med rec and consent have been done.

## 2022-05-31 IMAGING — DX DG CHEST 2V
2 series · 2 of 2 positions shown · non-contrast
Comparison: 04/17/2020

CLINICAL DATA: Chest pain

EXAM:
CHEST - 2 VIEW

[chest pa]
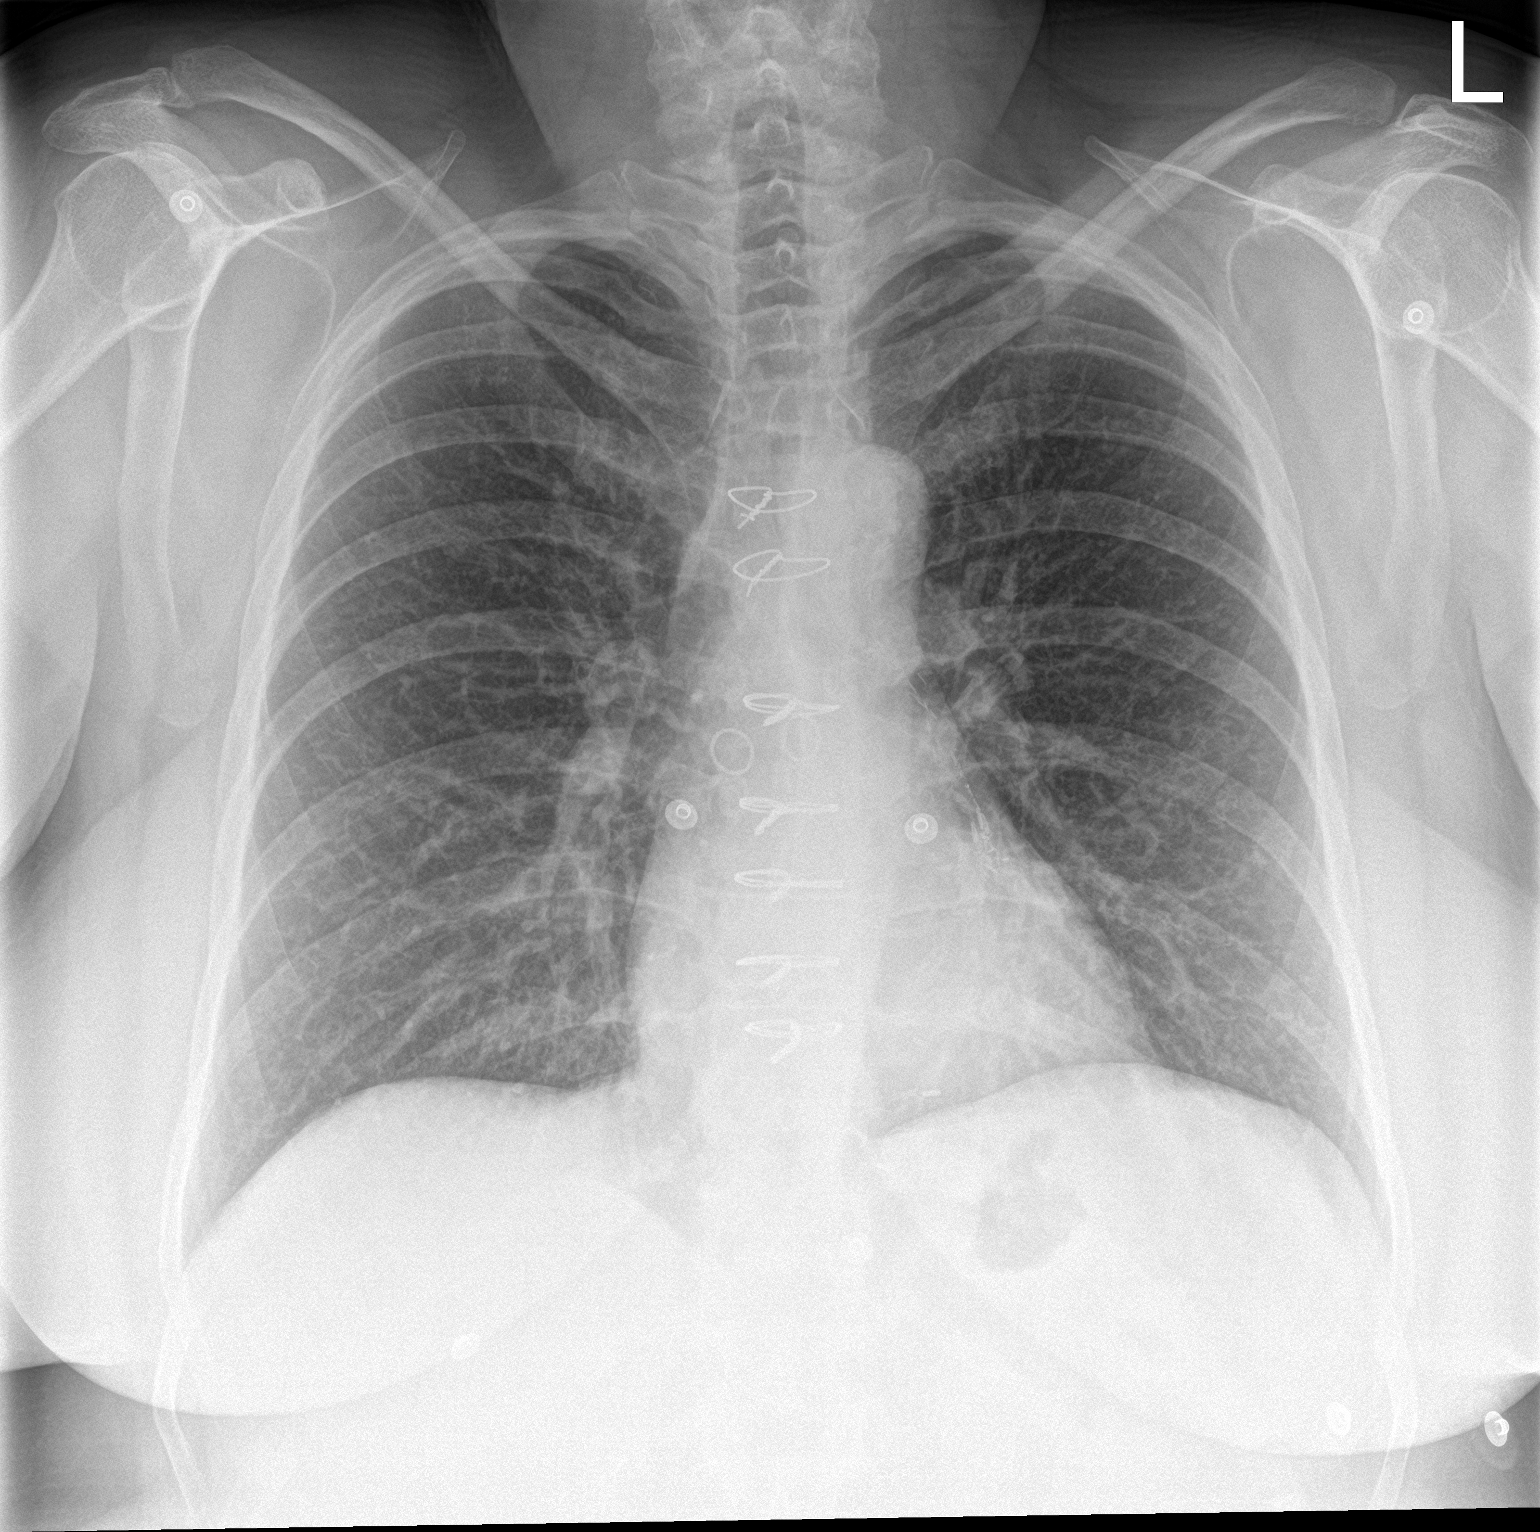

[chest lat]
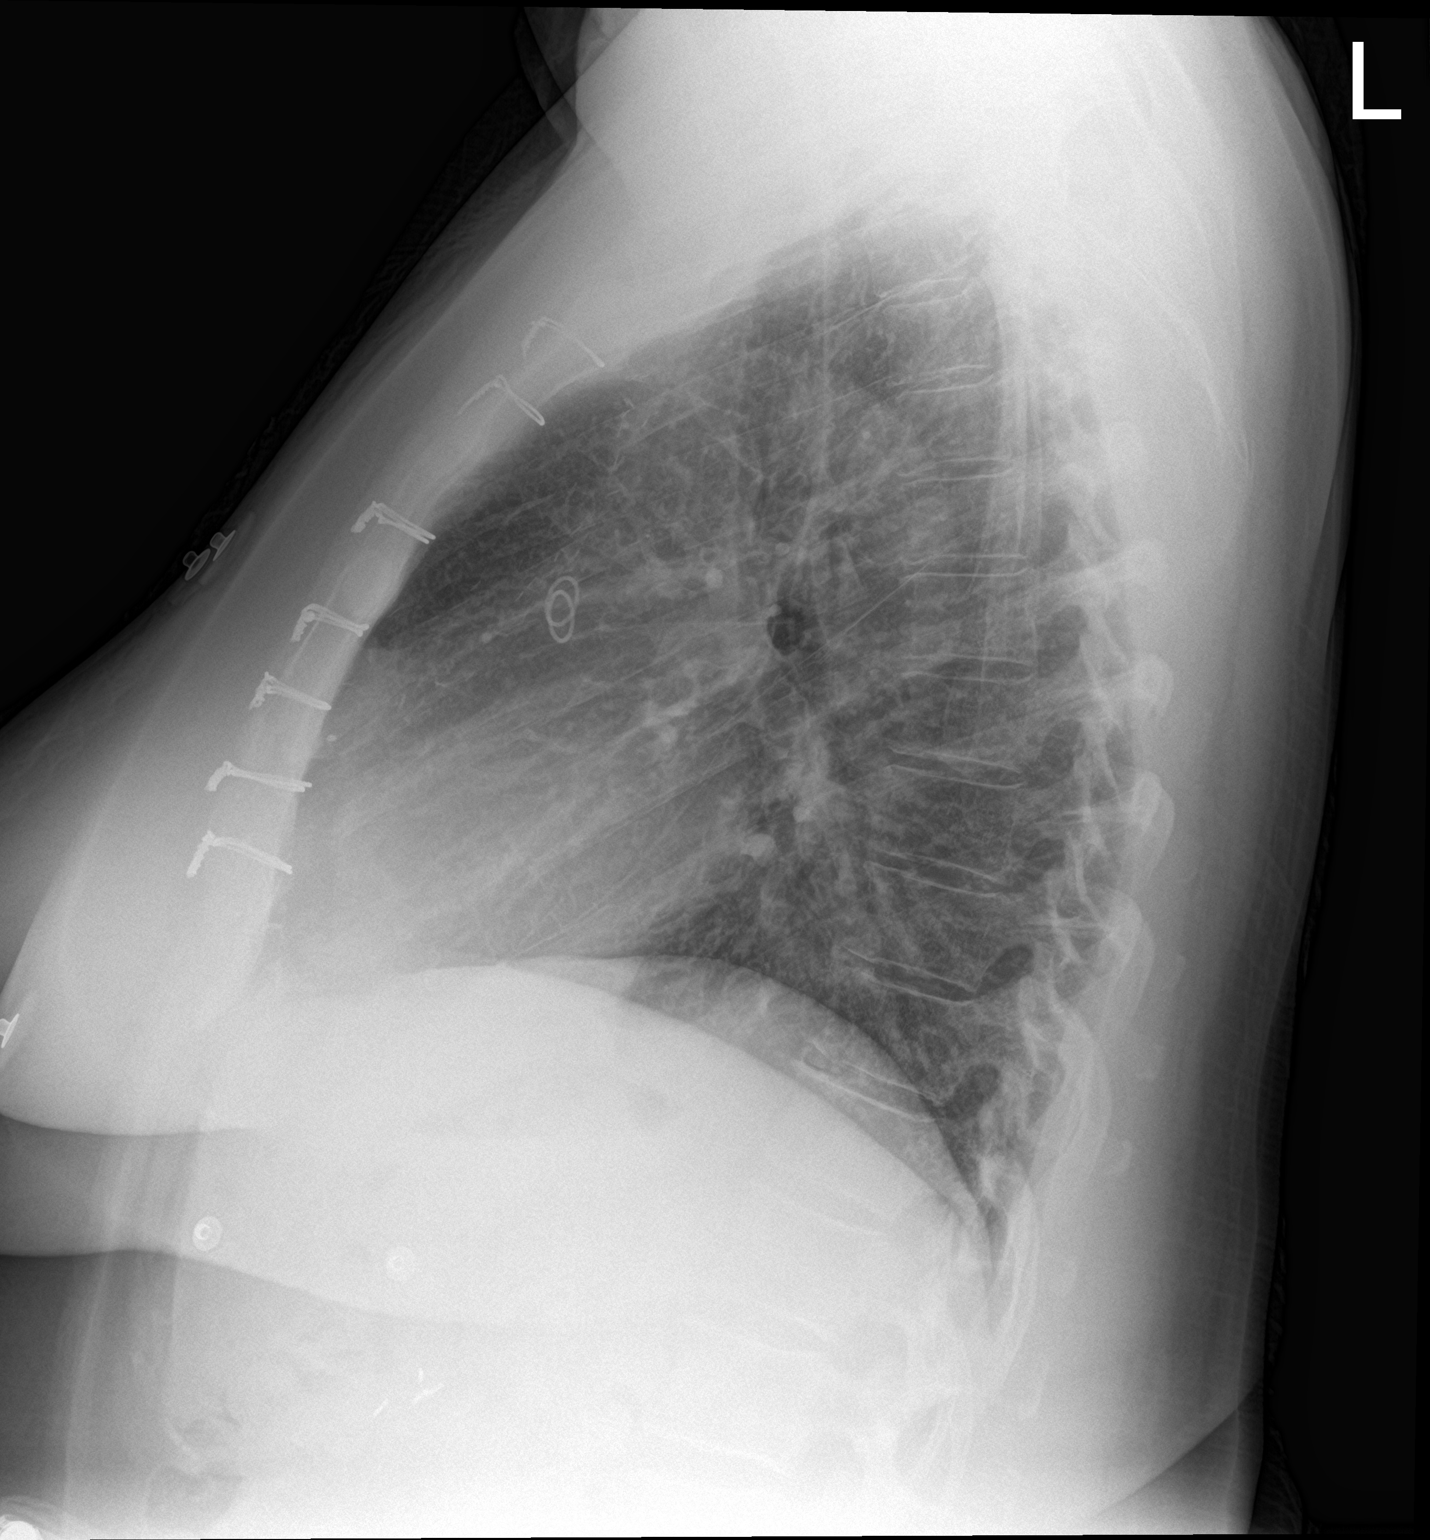

[2 of 2 positions shown; findings below may reference images not displayed]

FINDINGS: Post sternotomy changes. No focal opacity or pleural effusion.
Normal cardiomediastinal silhouette. No pneumothorax.
IMPRESSION: No active cardiopulmonary disease.

## 2022-06-02 ENCOUNTER — Ambulatory Visit: Payer: Commercial Managed Care - HMO | Attending: Cardiology | Admitting: Nurse Practitioner

## 2022-06-02 ENCOUNTER — Encounter: Payer: Self-pay | Admitting: Nurse Practitioner

## 2022-06-02 DIAGNOSIS — Z01810 Encounter for preprocedural cardiovascular examination: Secondary | ICD-10-CM

## 2022-06-02 NOTE — Progress Notes (Signed)
Virtual Visit via Telephone Note   Because of [name]'s co-morbid illnesses, she is at least at moderate risk for complications without adequate follow up.  This format is felt to be most appropriate for this patient at this time.  The patient did not have access to video technology/had technical difficulties with video requiring transitioning to audio format only (telephone).  All issues noted in this document were discussed and addressed.  No physical exam could be performed with this format.  Please refer to the patient's chart for her consent to telehealth for [facility].  Evaluation Performed:  Preoperative cardiovascular risk assessment _____________   Date:  06/02/2022   Patient ID:  [name], DOB 12/09/1970, MRN [id number] Patient Location:  Home Provider location:   Office  Primary Care Provider:  Glenis Smoker, MD Primary Cardiologist:  Donato Heinz, MD  Chief Complaint / Patient Profile   51 y.o. y/o female with a h/o CAD s/p CABG x 4 10/2019, type 2 diabetes, OSA, tobacco abuse, RBBB, HTN, HLD, and obesity who is pending left knee scope with PLM and presents today for telephonic preoperative cardiovascular risk assessment.  Past Medical History    Past Medical History:  Diagnosis Date   Anxiety    Arthritis    fingers   Cervical dysplasia    Coronary artery disease    Depression    Diabetes mellitus    Type II   Dyspnea 11/22/2013   GERD (gastroesophageal reflux disease)    High cholesterol    History of kidney stones    passed   Hypertension    no meds now   Kidney stones    Moderate episode of recurrent major depressive disorder ([place]) 12/28/2019   Neuropathy    feet and legs   Nuclear sclerotic cataract of both eyes 09/2019   Dr. Idolina Primer   PONV (postoperative nausea and vomiting)    one time   Sleep apnea    lost 70lbs no cpap x28yr now   Stage 3a chronic kidney disease ([place] 04/29/2020   Past Surgical  History:  Procedure Laterality Date   CARDIAC CATHETERIZATION     4-531yrago   CERVICAL CONIZATION W/BX N/A 05/04/2018   Procedure: CONIZATION CERVIX WITH BIOPSY - COLD KNIFE;  Surgeon: ErChancy MilroyMD;  [place] Service: Gynecology;  Laterality: N/A;   CHOLECYSTECTOMY     CORONARY ARTERY BYPASS GRAFT N/A 10/24/2019   Procedure: CORONARY ARTERY BYPASS GRAFTING (CABG) times four, using left radial artery harvested endoscopically and right greater saphenous vein harvested endoscopically.;  Surgeon: VaIvin PootMD;  [facility] Service: Open [facility] Surgery;  Laterality: N/A;   ELBOW FRACTURE SURGERY Left    ELBOW SURGERY Right    ENDOMETRIAL ABLATION     6 years ago   INCISION AND DRAINAGE     for boil- buttocks   INCISION AND DRAINAGE Left    Leg, Nephrotoxin fasciotimy   IRRIGATION AND DEBRIDEMENT ABSCESS Right 03/17/2017   Procedure: IRRIGATION AND DEBRIDEMENT RIGHT THIGH ABSCESS;  Surgeon: GrMichael BostonMD;  [facility];  Service: General;  Laterality: Right;   LAPAROSCOPIC APPENDECTOMY  10/04/2011   Procedure: APPENDECTOMY LAPAROSCOPIC;  Surgeon: BrJudieth KeensDO;  [facility]: [facility];  Service: General;  Laterality: N/A;   LEFT HEART CATH AND CORONARY ANGIOGRAPHY N/A 10/06/2019   Procedure: LEFT HEART CATH AND CORONARY ANGIOGRAPHY;  Surgeon: JoMartiniquePeter M, MD;  [facility];  Service: Cardiovascular;  Laterality: N/A;   RADIAL ARTERY HARVEST Left 10/24/2019   Procedure: Radial Artery Harvest;  Surgeon: Ivin Poot, MD;  Location: [place];  Service: Open Heart Surgery;  Laterality: Left;   TEE WITHOUT CARDIOVERSION N/A 10/24/2019   Procedure: TRANSESOPHAGEAL ECHOCARDIOGRAM (TEE);  Surgeon: Prescott Gum, Collier Salina, MD;  Location: [place];  Service: Open Heart Surgery;  Laterality: N/A;   TENDON RECONSTRUCTION Right 07/02/2021   Procedure: Right wrist 6 dorsal compartment tenosynovectomy and tendon repair Right elbow  lateral epicondylar debridement and tendon repair;  Surgeon: Iran Planas, MD;  Location: [place];  Service: Orthopedics;  Laterality: Right;   WRIST FRACTURE SURGERY Left    WRIST SURGERY Right     Allergies  Allergies  Allergen Reactions   Sulfa Antibiotics Hives   Codeine Itching    History of Present Illness    [name] is a 51 y.o. female who presents via audio/video conferencing for a telehealth visit today.  Pt was last seen in cardiology clinic on 12/03/21 by Dr. Gardiner Rhyme.  At that time ZANYIAH POSTEN was doing well.  The patient is now pending procedure as outlined above. Since her last visit, she denies chest pain, shortness of breath, fatigue, palpitations, melena, hematuria, hemoptysis, diaphoresis, weakness, presyncope, syncope, orthopnea, and PND. She is somewhat limited by knee pain but continues to achieve > 4 METS activity without dyspnea, chest pain, or other symptoms concerning for angina. Has left LE edema 2/2 knee pain.   Home Medications    Prior to Admission medications   Medication Sig Start Date End Date Taking? Authorizing Provider  acetaminophen (TYLENOL) 500 MG tablet Take 1,000 mg by mouth every 6 (six) hours as needed for moderate pain or headache.    [provider]  albuterol (VENTOLIN HFA) 108 (90 Base) MCG/ACT inhaler Inhale 1-2 puffs into the lungs every 6 (six) hours as needed for wheezing or shortness of breath. 02/20/20   Tasia Catchings, Amy V, PA-C  atorvastatin (LIPITOR) 80 MG tablet Take 1 tablet (80 mg total) by mouth daily. TAKE 1 TABLET(80 MG) BY MOUTH DAILY AT 6 PM 11/04/21   Donato Heinz, MD  Continuous Blood Gluc Sensor (DEXCOM G6 SENSOR) MISC 1 Device by Does not apply route as directed. 04/17/22   Shamleffer, Melanie Crazier, MD  Continuous Blood Gluc Transmit (DEXCOM G6 TRANSMITTER) MISC USE AS DIRECTED 04/17/22   Shamleffer, Melanie Crazier, MD  dapagliflozin propanediol (FARXIGA) 10 MG TABS tablet Take 1 tablet (10 mg total) by  mouth daily before breakfast. 10/17/21   Shamleffer, Melanie Crazier, MD  docusate sodium (COLACE) 100 MG capsule Take 1 capsule (100 mg total) by mouth 2 (two) times daily. 07/02/21   Iran Planas, MD  esomeprazole (NEXIUM) 20 MG capsule Take 20 mg by mouth daily.     [provider]  ezetimibe (ZETIA) 10 MG tablet Take 1 tablet (10 mg total) by mouth every evening. 11/06/21   Donato Heinz, MD  FLUoxetine (PROZAC) 40 MG capsule TAKE 1 CAPSULE(40 MG) BY MOUTH DAILY Patient taking differently: Take 40 mg by mouth daily. TAKE 1 CAPSULE(40 MG) BY MOUTH DAILY 04/29/20   Ladell Pier, MD  fluticasone [facility]) 50 MCG/ACT nasal spray Place 2 sprays into both nostrils daily as needed for allergies or rhinitis. 02/05/21   Brunetta Jeans, PA-C  furosemide (LASIX) 20 MG tablet TAKE 1 TABLET(20 MG) BY MOUTH DAILY 05/22/22   Donato Heinz, MD  Insulin Disposable  Pump (OMNIPOD 5 G6 INTRO, GEN 5,) KIT 1 Device by Does not apply route every other day. 04/17/22   Shamleffer, Melanie Crazier, MD  Insulin Disposable Pump (OMNIPOD 5 G6 POD, GEN 5,) MISC 1 Device by Does not apply route every other day. 04/17/22   Shamleffer, Melanie Crazier, MD  insulin lispro (HUMALOG) 100 UNIT/ML injection INJECT A MAX OF 60 UNITS PER PUMP AS DIRECTED 04/17/22   Shamleffer, Melanie Crazier, MD  Insulin Syringes, Disposable, U-100 1 ML MISC 1 Device by Does not apply route as directed. 09/19/20   Shamleffer, Melanie Crazier, MD  LAGEVRIO 200 MG CAPS capsule  09/05/21   [provider]  losartan (COZAAR) 50 MG tablet Take 1 tablet (50 mg total) by mouth daily. 11/27/21   Shamleffer, Melanie Crazier, MD  Melatonin 10 MG CAPS Take 10 mg by mouth at bedtime as needed (sleep).    [provider]  metFORMIN (GLUCOPHAGE-XR) 500 MG 24 hr tablet TAKE 2 TABLETS(1000 MG) BY MOUTH DAILY WITH BREAKFAST 05/22/22   Shamleffer, Melanie Crazier, MD  metoprolol succinate (TOPROL-XL) 25 MG 24 hr tablet Take 0.5  tablets (12.5 mg total) by mouth daily. Please call [phone] to schedule an appointment for future refills. Thank you. 05/13/22   Donato Heinz, MD  nitroGLYCERIN (NITROSTAT) 0.4 MG SL tablet Place 1 tablet (0.4 mg total) under the tongue every 5 (five) minutes as needed for chest pain. 12/03/21   Donato Heinz, MD  NOVOLOG 100 UNIT/ML injection INJECT A MAX OF 60 UNITS PER PUMP AS DIRECTED 09/23/21   Shamleffer, Melanie Crazier, MD  pregabalin (LYRICA) 300 MG capsule Take 1 capsule (300 mg total) by mouth 2 (two) times daily. 11/06/21   Shamleffer, Melanie Crazier, MD  Semaglutide,0.25 or 0.5MG/DOS, (OZEMPIC, 0.25 OR 0.5 MG/DOSE,) 2 MG/3ML SOPN Inject 0.5 mg into the skin once a week. 04/01/22   Shamleffer, Melanie Crazier, MD  TRUEPLUS SAFETY LANCETS 28G MISC Use as directed 01/13/18   Ladell Pier, MD    Physical Exam    Vital Signs:  Berline Lopes does not have vital signs available for review today.  Given telephonic nature of communication, physical exam is limited. AAOx3. NAD. Normal affect.  Speech and respirations are unlabored.  Accessory Clinical Findings    None  Assessment & Plan    1.  Preoperative Cardiovascular Risk Assessment: The patient is doing well from a cardiac perspective. Therefore, based on ACC/AHA guidelines, the patient would be at acceptable risk for the planned procedure without further cardiovascular testing.  According to the Revised Cardiac Risk Index (RCRI), her Perioperative Risk of Major Cardiac Event is (%): 6.6. Her Functional Capacity in METs is: 6.05 according to the Duke Activity Status Index (DASI).  The patient was advised that if she develops new symptoms prior to surgery to contact our office to arrange for a follow-up visit, and she verbalized understanding.  No cardiac medications requested to be held.   A copy of this note will be routed to requesting surgeon.  Time:   Today, I have spent 10 minutes with the  patient with telehealth technology discussing medical history, symptoms, and management plan.     Emmaline Life, NP-C  06/02/2022, 2:26 PM [address] Office [phone] Fax [phone]

## 2022-07-21 ENCOUNTER — Other Ambulatory Visit: Payer: Self-pay | Admitting: Cardiology

## 2022-07-30 ENCOUNTER — Other Ambulatory Visit: Payer: Self-pay | Admitting: Cardiology

## 2022-08-09 ENCOUNTER — Other Ambulatory Visit: Payer: Self-pay | Admitting: Internal Medicine

## 2022-08-18 ENCOUNTER — Other Ambulatory Visit: Payer: Self-pay

## 2022-08-18 DIAGNOSIS — E1165 Type 2 diabetes mellitus with hyperglycemia: Secondary | ICD-10-CM

## 2022-08-18 MED ORDER — OZEMPIC (0.25 OR 0.5 MG/DOSE) 2 MG/3ML ~~LOC~~ SOPN: 0.5000 mg | PEN_INJECTOR | SUBCUTANEOUS | 3 refills | Status: DC

## 2022-08-27 ENCOUNTER — Encounter (HOSPITAL_BASED_OUTPATIENT_CLINIC_OR_DEPARTMENT_OTHER): Payer: Self-pay | Admitting: Cardiology

## 2022-08-30 NOTE — Progress Notes (Unsigned)
Cardiology Office Note:    Date:  08/30/2022   ID:  CEARRA PORTNOY, DOB 09/07/1970, MRN [id number]  PCP:  [name], MD  Cardiologist:  Little Ishikawa, MD  Electrophysiologist:  None   Referring MD: [name], *   No chief complaint on file.   History of Present Illness:    [name] is a 51 y.o. female with a hx of coronary artery disease status post CABG x4 on 10/24/2019 (LIMA-LAD, radial-diagonal, SVG-diagonal, SVG-PDA), type 2 diabetes, hypertension, hyperlipidemia who presents for a follow-up evaluation.  Patient was admitted to [facility] on 06/13/19 with chest pain.  EKG was unremarkable and high-sensitivity troponins were negative.  However due to her extensive risk factors including uncontrolled diabetes, coronary CTA was ordered.  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  She was discharged on medical management: atorvastatin 80 mg, Imdur 30 mg.  TTE showed EF 60-65%.  During subsequent office visits, her antianginal regimen was titrated as she continued to report chest pain.  At last clinic visit on 10/02/2019, she was on metoprolol 25 mg twice daily and Imdur 60 mg daily, but further titration was limited by low blood pressures.  Given inability to further titrate up antianginals and having persistent chest pain, cardiac catheterization was ordered.  Underwent cath on 10/06/2019, which showed three-vessel CAD: 80% mid LAD, 85% slitlike ostial D1, 80% ostial D2, 80% small PDA, anomalous circumflex off the RCA cusp with moderate diffuse disease.  Recommendation was for aggressive medical management and if symptoms persist consider surgical  consultation.  She continued to have chest pain despite medical management, so was referred to cardiac surgery.  She underwent CABG x4 on 10/24/2019 (LIMA-LAD, radial-diagonal, SVG-diagonal, SVG-PDA) with Dr. Donata Clay.  Echocardiogram on 09/20/2018 showed biventricular function, no significant valvular disease.  Stress CMR on 11/05/2020 showed no evidence of ischemia, no LGE, LVEF 61%, RVEF 60%.  Since last clinic visit,  she reports has been doing okay.  Reports occasional heaviness in chest, seems to occur after eating.  She walks on treadmill 20 minutes at least 3 times per week.  No exertional symptoms.  Occasional lightheadedness with standing.  Reports lower extremity edema if she does not take her Lasix.   Wt Readings from Last 3 Encounters:  04/17/22 296 lb (134.3 kg)  03/11/22 (!) 304 lb 6.4 oz (138.1 kg)  12/03/21 (!) 301 lb 3.2 oz (136.6 kg)     Past Medical History:  Diagnosis Date   Anxiety    Arthritis    fingers   Cervical dysplasia    Coronary artery disease    Depression    Diabetes mellitus    Type II   Dyspnea 11/22/2013   GERD (gastroesophageal reflux disease)    High cholesterol    History of kidney stones    passed   Hypertension    no meds now   Kidney stones    Moderate episode of recurrent major depressive disorder ([facility]) 12/28/2019   Neuropathy    feet and legs   Nuclear sclerotic cataract of both eyes 09/2019   Dr. Shea Evans   PONV (postoperative nausea and vomiting)    one time   Sleep apnea  lost 70lbs no cpap x33yrs now   Stage 3a chronic kidney disease ([facility]) 04/29/2020    Past Surgical History:  Procedure Laterality Date   CARDIAC CATHETERIZATION     4-49yrs ago   CERVICAL CONIZATION W/BX N/A 05/04/2018   Procedure: CONIZATION CERVIX WITH BIOPSY - COLD KNIFE;  Surgeon: Hermina Staggers, MD;  Location: [facility];  Service: Gynecology;  Laterality: N/A;   CHOLECYSTECTOMY     CORONARY ARTERY BYPASS GRAFT N/A 10/24/2019   Procedure:  CORONARY ARTERY BYPASS GRAFTING (CABG) times four, using left radial artery harvested endoscopically and right greater saphenous vein harvested endoscopically.;  Surgeon: Kerin Perna, MD;  Location: [facility] OR;  Service: Open Heart Surgery;  Laterality: N/A;   ELBOW FRACTURE SURGERY Left    ELBOW SURGERY Right    ENDOMETRIAL ABLATION     6 years ago   INCISION AND DRAINAGE     for boil- buttocks   INCISION AND DRAINAGE Left    Leg, Nephrotoxin fasciotimy   IRRIGATION AND DEBRIDEMENT ABSCESS Right 03/17/2017   Procedure: IRRIGATION AND DEBRIDEMENT RIGHT THIGH ABSCESS;  Surgeon: Karie Soda, MD;  [facility];  Service: General;  Laterality: Right;   LAPAROSCOPIC APPENDECTOMY  10/04/2011   Procedure: APPENDECTOMY LAPAROSCOPIC;  Surgeon: Rulon Abide, DO;  [facility];  Service: General;  Laterality: N/A;   LEFT HEART CATH AND CORONARY ANGIOGRAPHY N/A 10/06/2019   Procedure: LEFT HEART CATH AND CORONARY ANGIOGRAPHY;  Surgeon: [place], Peter M, MD;  [facility] INVASIVE CV LAB;  Service: Cardiovascular;  Laterality: N/A;   RADIAL ARTERY HARVEST Left 10/24/2019   Procedure: Radial Artery Harvest;  Surgeon: Kerin Perna, MD;  Location: [facility] OR;  Service: Open Heart Surgery;  Laterality: Left;   TEE WITHOUT CARDIOVERSION N/A 10/24/2019   Procedure: TRANSESOPHAGEAL ECHOCARDIOGRAM (TEE);  Surgeon: Donata Clay, Theron Arista, MD;  Location: [facility];  Service: Open Heart Surgery;  Laterality: N/A;   TENDON RECONSTRUCTION Right 07/02/2021   Procedure: Right wrist 6 dorsal compartment tenosynovectomy and tendon repair Right elbow lateral epicondylar debridement and tendon repair;  Surgeon: Bradly Bienenstock, MD;  Location: [facility] OR;  Service: Orthopedics;  Laterality: Right;   WRIST FRACTURE SURGERY Left    WRIST SURGERY Right     Current Medications: No outpatient medications have been marked as taking for the 09/01/22 encounter (Appointment) with Little Ishikawa, MD.     Allergies:   Sulfa  antibiotics and Codeine   Social History   Socioeconomic History   Marital status: Widowed    Spouse name: Not on file   Number of children: 1   Years of education: Not on file   Highest education level: Not on file  Occupational History   Occupation: none  Tobacco Use   Smoking status: Former    Packs/day: 2.00    Years: 24.00    Total pack years: 48.00    Types: E-cigarettes, Cigarettes    Quit date: 04/22/2017    Years since quitting: 5.3    Passive exposure: Never   Smokeless tobacco: Never   Tobacco comments:    vape  Vaping Use   Vaping Use: Former  Substance and Sexual Activity   Alcohol use: Yes    Comment: rare   Drug use: No   Sexual activity: Yes    Birth control/protection: None  Other Topics Concern   Not on file  Social History Narrative   Not on file   Social Determinants of Corporate investment banker  Strain: Not on file  Food Insecurity: Not on file  Transportation Needs: Not on file  Physical Activity: Not on file  Stress: Not on file  Social Connections: Not on file     Family History: The patient's family history includes Aneurysm in her mother; Breast cancer in her paternal grandmother; Congestive Heart Failure in her sister; Heart attack in her father; Heart disease in her sister; Osteoarthritis in her sister and sister; Pancreatic cancer in her mother; Stroke in her father.  ROS:   Please see the history of present illness.     All other systems reviewed and are negative.  EKGs/Labs/Other Studies Reviewed:    The following studies were reviewed today:   EKG:   12/03/21: NSR, rate 71, RBBB,   TTE 06/14/19:  1. Left ventricular ejection fraction, by visual estimation, is 60 to 65%. The left ventricle has normal function. Normal left ventricular size. There is no left ventricular hypertrophy.  2. Left ventricular diastolic Doppler parameters are consistent with impaired relaxation pattern of LV diastolic filling.  3. Global right  ventricle has normal systolic function.The right ventricular size is normal. No increase in right ventricular wall thickness.  4. Left atrial size was normal.  5. Right atrial size was normal.  6. The mitral valve is normal in structure. No evidence of mitral valve regurgitation. No evidence of mitral stenosis.  7. The tricuspid valve is normal in structure. Tricuspid valve regurgitation was not visualized by color flow Doppler.  8. The aortic valve is normal in structure. Aortic valve regurgitation was not visualized by color flow Doppler. Structurally normal aortic valve, with no evidence of sclerosis or stenosis.  9. The pulmonic valve was normal in structure. Pulmonic valve regurgitation is not visualized by color flow Doppler. 10. The inferior vena cava is normal in size with greater than 50% respiratory variability, suggesting right atrial pressure of 3 mmHg.  Coronary CT 06/15/19: 1. Coronary calcium score of 81. This was 36 percentile for age and sex matched control. 2. 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 Noncalcified plaque in mid LCX appears to cause severe stenosis (70-99%) but vessel is small (<53mm) 3. Calcified plaque in the proximal LAD causes 25-49% stenosis  CTFFR 06/15/19: 1. CT-FFR across lesion in proximal LAD is 0.92, suggesting lesion is not functionally significant 2. CT-FFR across lesion in ostial D2 is 0.62, suggesting functional significance 3. CT-FFR across lesion in anomalous circumflex is 0.65, suggesting functional significance  Cath 10/06/19: Prox Cx lesion is 60% stenosed. Mid Cx lesion is 70% stenosed. RPDA lesion is 80% stenosed. Mid LM to Ost LAD lesion is 35% stenosed. 1st Diag lesion is 85% stenosed. 2nd Diag lesion is 80% stenosed. Mid LAD lesion is 80% stenosed. LV end diastolic pressure is normal.   1. Three vessel CAD.    - 80% mid LAD after the second diagonal    - 85% slit like  stenosis at the origin of the first diagonal- best seen in the LAO caudal shot.    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 Normal LVEDP   Plan: reviewed with interventional colleagues. Her anatomy doesn't offer good PCI options except the mid LAD. Treating the ostial disease in the diagonal branches would likely require stenting into the LAD. The LCx and PDA are too small for PCI. I would aggressively treat  medically. If symptoms persist I would consider surgical consultation.      Recent Labs: 12/03/2021: Magnesium 2.1 12/09/2021: BUN 32; Creatinine, Ser 1.47; Hemoglobin 12.6; Platelets 173; Potassium 4.4; Sodium 139  Recent Lipid Panel    Component Value Date/Time   CHOL 147 07/30/2021 1213   CHOL 152 08/27/2020 1227   TRIG 128.0 07/30/2021 1213   HDL 68.70 07/30/2021 1213   HDL 76 08/27/2020 1227   CHOLHDL 2 07/30/2021 1213   VLDL 25.6 07/30/2021 1213   LDLCALC 53 07/30/2021 1213   LDLCALC 60 08/27/2020 1227    Physical Exam:    VS:  LMP  (LMP Unknown)     Wt Readings from Last 3 Encounters:  04/17/22 296 lb (134.3 kg)  03/11/22 (!) 304 lb 6.4 oz (138.1 kg)  12/03/21 (!) 301 lb 3.2 oz (136.6 kg)    GEN:  in no acute distress HEENT: Normal NECK: No JVD LYMPHATICS: No lymphadenopathy CARDIAC: RRR, no murmurs, rubs, gallops RESPIRATORY:  Clear to auscultation without rales, wheezing or rhonchi  ABDOMEN: Soft, non-tender, non-distended MUSCULOSKELETAL:  No edema; No deformity  SKIN: Warm and dry.   NEUROLOGIC:  Alert and oriented x 3 PSYCHIATRIC:  Normal affect   ASSESSMENT:    No diagnosis found.    PLAN:    Coronary artery disease: three-vessel CAD on cath 10/06/19: 80% mid LAD, 85% slitlike ostial D1, 80% ostial D2, 80% small PDA, anomalous circumflex off the RCA cusp with moderate diffuse disease.   Status post CABG x4 on 10/24/2019 (LIMA-LAD, radial-diagonal, SVG-diagonal, SVG-PDA).   Normal LV systolic function on echo 06/14/2019.  Stress CMR on 11/05/2020 showed no evidence of ischemia, no LGE, LVEF 61%, RVEF 60%.  Currently denies any anginal symptoms. -Continue aspirin 81 mg daily  -Continue atorvastatin 80 mg daily and Zetia 10 mg daily. LDL 60 on 08/27/20 -Continue Toprol-XL 12.5 mg daily  Lower extremity edema: No structural heart disease on echocardiogram.  No DVT on duplex.Marland Kitchen  BNP was normal.  Has improved with PO lasix 20 mg daily, will continue.  Check BMET/magnesium.  Hyperlipidemia: LDL 204 on 06/14/19.  Started on atorvastatin 80 mg daily and Zetia 10 mg daily.  LDL 53 on 07/30/2021  Tobacco use: smoked 2ppd x 30 years, has quit smoking but now vaping.  Have discussed risks of vaping and cessation strongly recommended  Type 2 diabetes: A1c 7.7% on 3/30//2022, down from 15.1 on 10/20/2019.  On insulin pump, Metformin, Farxiga, Ozempic.  Follows with endocrinology.    OSA: positive sleep study 07/28/21, started CPAP  Morbid obesity: There is no height or weight on file to calculate BMI.  Just started on ozempic. Referred to healthy weight and wellness   RTC in 6 months     Medication Adjustments/Labs and Tests Ordered: Current medicines are reviewed at length with the patient today.  Concerns regarding medicines are outlined above.  No orders of the defined types were placed in this encounter.   No orders of the defined types were placed in this encounter.    There are no Patient Instructions on file for this visit.   Signed, Little Ishikawa, MD  08/30/2022 10:26 PM    [facility]

## 2022-09-01 ENCOUNTER — Encounter: Payer: Self-pay | Admitting: Cardiology

## 2022-09-01 ENCOUNTER — Ambulatory Visit: Payer: Commercial Managed Care - HMO | Attending: Cardiology | Admitting: Cardiology

## 2022-09-01 VITALS — BP 112/72 | HR 66 | Ht 68.5 in | Wt 293.0 lb

## 2022-09-01 DIAGNOSIS — I2510 Atherosclerotic heart disease of native coronary artery without angina pectoris: Secondary | ICD-10-CM | POA: Diagnosis not present

## 2022-09-01 DIAGNOSIS — M79605 Pain in left leg: Secondary | ICD-10-CM | POA: Diagnosis not present

## 2022-09-01 DIAGNOSIS — R079 Chest pain, unspecified: Secondary | ICD-10-CM | POA: Diagnosis not present

## 2022-09-01 DIAGNOSIS — Z6841 Body Mass Index (BMI) 40.0 and over, adult: Secondary | ICD-10-CM

## 2022-09-01 DIAGNOSIS — G4733 Obstructive sleep apnea (adult) (pediatric): Secondary | ICD-10-CM

## 2022-09-01 DIAGNOSIS — E785 Hyperlipidemia, unspecified: Secondary | ICD-10-CM

## 2022-09-01 MED ORDER — NITROGLYCERIN 0.4 MG SL SUBL: 0.4000 mg | SUBLINGUAL_TABLET | SUBLINGUAL | 3 refills | Status: AC | PRN

## 2022-09-01 NOTE — Patient Instructions (Addendum)
Medication Instructions:  RESTART Atorvastatin, Zetia and Aspirin medication  Refill of Nitroglycerin sent to pharmacy.   *If you need a refill on your cardiac medications before your next appointment, please call your pharmacy*   Lab Work: BMET, MAG today   If you have labs (blood work) drawn today and your tests are completely normal, you will receive your results only by: [place] (if you have MyChart) OR A paper copy in the mail If you have any lab test that is abnormal or we need to change your treatment, we will call you to review the results.   Testing/Procedures:  CARDIAC PET- Your physician has requested that you have a Cardiac Pet Stress Test. This testing is completed at [facility] ([place], [place] [address]). The schedulers will call you to get this scheduled. Please follow instructions below and call the office with any questions/concerns [phone]).   Your physician has requested that you have a lower or upper extremity arterial duplex. This test is an ultrasound of the arteries in the legs or arms. It looks at arterial blood flow in the legs and arms. Allow one hour for Lower and Upper Arterial scans. There are no restrictions or special instructions    Follow-Up: At [facility], you and your health needs are our priority.  As part of our continuing mission to provide you with exceptional heart care, we have created designated Provider Care Teams.  These Care Teams include your primary Cardiologist (physician) and Advanced Practice Providers (APPs -  Physician Assistants and Nurse Practitioners) who all work together to provide you with the care you need, when you need it.  We recommend signing up for the patient portal called "MyChart".  Sign up information is provided on this After Visit Summary.  MyChart is used to connect with patients for Virtual Visits (Telemedicine).  Patients are able to view lab/test  results, encounter notes, upcoming appointments, etc.  Non-urgent messages can be sent to your provider as well.   To learn more about what you can do with MyChart, go to [email or web address].    Your next appointment:   3 month(s)  The format for your next appointment:   In Person  Provider:   Donato Heinz, MD   Other Instructions  Referral to [facility] - they will contact you for an appointment.       How to Prepare for Your Cardiac PET/CT Stress Test:  1. Please do not take these medications before your test:   Medications that may interfere with the cardiac pharmacological stress agent (ex. nitrates - including erectile dysfunction medications, isosorbide mononitrate or beta-blockers) the day of the exam. (Erectile dysfunction medication should be held for at least 72 hrs prior to test) Theophylline containing medications for 12 hours. Dipyridamole 48 hours prior to the test. Your remaining medications may be taken with water.  2. Nothing to eat or drink, except water, 3 hours prior to arrival time.   NO caffeine/decaffeinated products, or chocolate 12 hours prior to arrival.  3. NO perfume, cologne or lotion  4. Total time is 1 to 2 hours; you may want to bring reading material for the waiting time.  5. Please report to Admitting at the [facility] Main Entrance 30 minutes early for your test.  [place], [place] [address]  Diabetic Preparation:  Hold oral medications. You may take NPH and Lantus insulin. Do not take Humalog or Humulin  R (Regular Insulin) the day of your test. Check blood sugars prior to leaving the house. If able to eat breakfast prior to 3 hour fasting, you may take all medications, including your insulin, Do not worry if you miss your breakfast dose of insulin - start at your next meal.  IF YOU THINK YOU MAY BE PREGNANT, OR ARE NURSING PLEASE INFORM THE TECHNOLOGIST.  In preparation for  your appointment, medication and supplies will be purchased.  Appointment availability is limited, so if you need to cancel or reschedule, please call the Radiology Department at [phone]  24 hours in advance to avoid a cancellation fee of $100.00  What to Expect After you Arrive:  Once you arrive and check in for your appointment, you will be taken to a preparation room within the Radiology Department.  A technologist or Nurse will obtain your medical history, verify that you are correctly prepped for the exam, and explain the procedure.  Afterwards,  an IV will be started in your arm and electrodes will be placed on your skin for EKG monitoring during the stress portion of the exam. Then you will be escorted to the PET/CT scanner.  There, staff will get you positioned on the scanner and obtain a blood pressure and EKG.  During the exam, you will continue to be connected to the EKG and blood pressure machines.  A small, safe amount of a radioactive tracer will be injected in your IV to obtain a series of pictures of your heart along with an injection of a stress agent.    After your Exam:  It is recommended that you eat a meal and drink a caffeinated beverage to counter act any effects of the stress agent.  Drink plenty of fluids for the remainder of the day and urinate frequently for the first couple of hours after the exam.  Your doctor will inform you of your test results within 7-10 business days.  For questions about your test or how to prepare for your test, please call: Marchia Bond, Cardiac Imaging Nurse Navigator  Gordy Clement, Cardiac Imaging Nurse Navigator Office: [phone]

## 2022-09-02 ENCOUNTER — Encounter: Payer: Self-pay | Admitting: Cardiology

## 2022-09-02 ENCOUNTER — Other Ambulatory Visit (HOSPITAL_COMMUNITY): Payer: Self-pay

## 2022-09-02 ENCOUNTER — Ambulatory Visit (HOSPITAL_COMMUNITY)
Admission: RE | Admit: 2022-09-02 | Discharge: 2022-09-02 | Disposition: A | Discharge status: Home / Self Care | Payer: Medicaid Other | Source: Ambulatory Visit | Attending: Cardiovascular Disease | Admitting: Cardiovascular Disease

## 2022-09-02 ENCOUNTER — Telehealth: Payer: Self-pay

## 2022-09-02 DIAGNOSIS — M79605 Pain in left leg: Secondary | ICD-10-CM | POA: Insufficient documentation

## 2022-09-02 LAB — BASIC METABOLIC PANEL
BUN/Creatinine Ratio: 22 (ref 9–23)
BUN: 29 mg/dL — ABNORMAL HIGH (ref 6–24)
CO2: 25 mmol/L (ref 20–29)
Calcium: 9.7 mg/dL (ref 8.7–10.2)
Chloride: 101 mmol/L (ref 96–106)
Creatinine, Ser: 1.34 mg/dL — ABNORMAL HIGH (ref 0.57–1.00)
Glucose: 75 mg/dL (ref 70–99)
Potassium: 4.5 mmol/L (ref 3.5–5.2)
Sodium: 142 mmol/L (ref 134–144)
eGFR: 47 mL/min/{1.73_m2} — ABNORMAL LOW (ref >59)

## 2022-09-02 LAB — MAGNESIUM: Magnesium: 2.2 mg/dL (ref 1.6–2.3)

## 2022-09-02 NOTE — Telephone Encounter (Signed)
Patient Advocate Encounter  Prior authorization is required for Dexcom G6 Sensor through [employer].  PA submitted and APPROVED on 09-02-2022.  Key [id number]  Effective: 09-02-2022 - 09-02-2023

## 2022-09-25 ENCOUNTER — Telehealth (HOSPITAL_COMMUNITY): Payer: Self-pay | Admitting: *Deleted

## 2022-09-25 NOTE — Telephone Encounter (Signed)
Attempted to call patient regarding upcoming cardiac PET appointment. Left message on voicemail with name and callback number  Kriston Mckinnie RN Navigator Cardiac Imaging [facility] [phone] Office [phone] Cell  Reminder for patient to avoid caffeine 12 hours prior to appt. 

## 2022-09-28 ENCOUNTER — Encounter (HOSPITAL_COMMUNITY): Payer: Self-pay

## 2022-09-28 ENCOUNTER — Other Ambulatory Visit: Payer: Self-pay | Admitting: Internal Medicine

## 2022-09-28 ENCOUNTER — Other Ambulatory Visit: Payer: Self-pay | Admitting: Cardiology

## 2022-09-29 ENCOUNTER — Encounter (HOSPITAL_COMMUNITY)
Admission: RE | Admit: 2022-09-29 | Discharge: 2022-09-29 | Disposition: A | Discharge status: Home / Self Care | Payer: Medicaid Other | Source: Ambulatory Visit | Attending: Cardiology | Admitting: Cardiology

## 2022-09-29 DIAGNOSIS — R079 Chest pain, unspecified: Secondary | ICD-10-CM | POA: Diagnosis present

## 2022-09-29 DIAGNOSIS — Z951 Presence of aortocoronary bypass graft: Secondary | ICD-10-CM | POA: Insufficient documentation

## 2022-09-29 LAB — NM PET CT CARDIAC PERFUSION MULTI W/ABSOLUTE BLOODFLOW
LV dias vol: 94 mL (ref 46–106)
LV sys vol: 36 mL
MBFR: 1.44
Nuc Rest EF: 62 %
Nuc Stress EF: 67 %
Peak HR: 67 {beats}/min
Rest HR: 63 {beats}/min
Rest MBF: 0.88 ml/g/min
Rest Nuclear Isotope Dose: 30 mCi
Rest perfusion cavity size (mL): 94 mL
ST Depression (mm): 0 mm
Stress MBF: 1.27 ml/g/min
Stress Nuclear Isotope Dose: 30 mCi
Stress perfusion cavity size (mL): 104 mL
TID: 0.98

## 2022-09-29 MED ORDER — RUBIDIUM RB82 GENERATOR (RUBYFILL)
30.0000 | PACK | Freq: Once | INTRAVENOUS | Status: AC
  Administered 2022-09-29: 30 via INTRAVENOUS

## 2022-09-29 MED ORDER — REGADENOSON 0.4 MG/5ML IV SOLN
INTRAVENOUS | Status: AC
  Administered 2022-09-29: 0.4 mg via INTRAVENOUS
  Filled 2022-09-29: qty 5

## 2022-09-29 MED ORDER — REGADENOSON 0.4 MG/5ML IV SOLN: 0.4000 mg | Freq: Once | INTRAVENOUS | Status: AC

## 2022-09-29 NOTE — Progress Notes (Signed)
Patient presents for a cardiac PET stress test and tolerated procedure without incident. Patient maintained acceptable vital signs throughout the test and was offered caffeine after test.  Patient ambulated out of department with a steady gait.  

## 2022-10-02 ENCOUNTER — Other Ambulatory Visit (HOSPITAL_COMMUNITY): Payer: Self-pay

## 2022-10-05 ENCOUNTER — Ambulatory Visit (INDEPENDENT_AMBULATORY_CARE_PROVIDER_SITE_OTHER): Payer: Medicaid Other | Admitting: Internal Medicine

## 2022-10-05 ENCOUNTER — Encounter (INDEPENDENT_AMBULATORY_CARE_PROVIDER_SITE_OTHER): Payer: Self-pay | Admitting: Internal Medicine

## 2022-10-05 ENCOUNTER — Ambulatory Visit: Payer: Medicaid Other | Admitting: Internal Medicine

## 2022-10-05 ENCOUNTER — Encounter: Payer: Self-pay | Admitting: Internal Medicine

## 2022-10-05 ENCOUNTER — Telehealth: Payer: Self-pay

## 2022-10-05 VITALS — BP 120/80 | HR 63 | Ht 68.5 in | Wt 289.0 lb

## 2022-10-05 VITALS — BP 138/82 | HR 68 | Temp 97.7°F | Ht 69.0 in

## 2022-10-05 DIAGNOSIS — Z794 Long term (current) use of insulin: Secondary | ICD-10-CM

## 2022-10-05 DIAGNOSIS — I10 Essential (primary) hypertension: Secondary | ICD-10-CM

## 2022-10-05 DIAGNOSIS — E1122 Type 2 diabetes mellitus with diabetic chronic kidney disease: Secondary | ICD-10-CM

## 2022-10-05 DIAGNOSIS — E1165 Type 2 diabetes mellitus with hyperglycemia: Secondary | ICD-10-CM

## 2022-10-05 DIAGNOSIS — E1159 Type 2 diabetes mellitus with other circulatory complications: Secondary | ICD-10-CM | POA: Diagnosis not present

## 2022-10-05 DIAGNOSIS — G63 Polyneuropathy in diseases classified elsewhere: Secondary | ICD-10-CM

## 2022-10-05 DIAGNOSIS — Z7985 Long-term (current) use of injectable non-insulin antidiabetic drugs: Secondary | ICD-10-CM

## 2022-10-05 DIAGNOSIS — G4733 Obstructive sleep apnea (adult) (pediatric): Secondary | ICD-10-CM | POA: Diagnosis not present

## 2022-10-05 DIAGNOSIS — Z6841 Body Mass Index (BMI) 40.0 and over, adult: Secondary | ICD-10-CM

## 2022-10-05 DIAGNOSIS — E785 Hyperlipidemia, unspecified: Secondary | ICD-10-CM

## 2022-10-05 DIAGNOSIS — E11319 Type 2 diabetes mellitus with unspecified diabetic retinopathy without macular edema: Secondary | ICD-10-CM

## 2022-10-05 DIAGNOSIS — N1831 Chronic kidney disease, stage 3a: Secondary | ICD-10-CM

## 2022-10-05 DIAGNOSIS — E1142 Type 2 diabetes mellitus with diabetic polyneuropathy: Secondary | ICD-10-CM | POA: Diagnosis not present

## 2022-10-05 LAB — POCT GLYCOSYLATED HEMOGLOBIN (HGB A1C): Hemoglobin A1C: 10.5 % — AB (ref 4.0–5.6)

## 2022-10-05 MED ORDER — DAPAGLIFLOZIN PROPANEDIOL 10 MG PO TABS: 10.0000 mg | ORAL_TABLET | Freq: Every day | ORAL | 3 refills | Status: DC

## 2022-10-05 MED ORDER — OMNIPOD 5 DEXG7G6 PODS GEN 5 MISC: 1.0000 | 3 refills | Status: DC

## 2022-10-05 MED ORDER — DEXCOM G6 TRANSMITTER MISC: 3 refills | Status: DC

## 2022-10-05 MED ORDER — DEXCOM G6 SENSOR MISC: 1.0000 | 3 refills | Status: DC

## 2022-10-05 MED ORDER — INSULIN LISPRO 100 UNIT/ML IJ SOLN: INTRAMUSCULAR | 3 refills | Status: DC

## 2022-10-05 MED ORDER — SEMAGLUTIDE (1 MG/DOSE) 4 MG/3ML ~~LOC~~ SOPN: 1.0000 mg | PEN_INJECTOR | SUBCUTANEOUS | 3 refills | Status: DC

## 2022-10-05 MED ORDER — METFORMIN HCL ER 500 MG PO TB24: ORAL_TABLET | ORAL | 3 refills | Status: AC

## 2022-10-05 NOTE — Progress Notes (Addendum)
Name: [name]  Age/ Sex: 52 y.o., female   MRN/ DOB: [id number], [birth year]     PCP: Glenis Smoker, MD   Reason for Endocrinology Evaluation: Type 2 Diabetes Mellitus  Initial Endocrine Consultative Visit: 11/16/2019    PATIENT IDENTIFIER: [name] is a 52 y.o. female with a past medical history of CAD, T2DM and HTN. The patient has followed with Endocrinology clinic since 11/16/2019 for consultative assistance with management of her diabetes.  DIABETIC HISTORY:  [name] was diagnosed with DM in 2004. She is intolerant to higher doses of metformin, nor to Trulicity/ Victoza  due to diarrhea. She was able to tolerate Victoza which was started 05/2019. Her hemoglobin A1c has ranged from 7.2% in 2013, peaking at >15.0% in 2019.    On her initial visit to our clinic she had an A1c of 15.1% . She was on Levemir, Metformin  And victoza and we added [place].   Victoza was stopped 05/2020 due to "severe nausea"  She was started on the Omnipod 07/2020   Victoza stopped 05/2020 due to nausea and diarrhea  Metformin reduced by 50% due to GI issues     Switched Gabapentin to Lyrica 10/2020 SUBJECTIVE:   During the last visit (04/17/2022): A1c 7.7  %.     Today (10/05/2022): [name] is here for a follow up on diabetes management.  She typically uses dexcom for glucose checks but has not used it in a couple of weeks due to    She has been following up with Ortho for left knee pain She had a follow-up with cardiology for CAD Continues with Lyrica  Denies nausea, vomiting or diarrhea   She has an upcoming appointment with [facility] this week   Follows with nephrology and rheumatology   This patient with type 2 diabetes is treated with Omnipod (insulin pump).     Pump and meter download:     Pump   Omnipod Settings   Insulin type   NOVOLOG    Basal rate       0000 2.4 u/h    0600 2.15          I:C ratio        0000 1:8                  Sensitivity       0000  20      Goal       0000  110-120       Type & Model of Pump: Omnipod Insulin Type: Currently using Novolog    PUMP STATISTICS:  Average BG: 267  Average Daily Carbs (g):  Average Total Daily Insulin: 42.2 Average Daily Basal: 38.1 (90 %) Average Daily Bolus: 4.1 (10 %)     HOME DIABETES REGIMEN:  Metformin 500 mg XR , 2 tablet with breakfast  Farxiga 10 mg daily  Ozempic 0.25 mg weekly     Statin: Yes ACE-I/ARB: no     CONTINUOUS GLUCOSE MONITORING RECORD INTERPRETATION  :n/a        DIABETIC COMPLICATIONS: Microvascular complications:  Neuropathy, retinopathy Denies: CKD Last Eye Exam: Completed   Macrovascular complications:  CAD ( S/P CABG 10/2019) Denies:  CVA, PVD   HISTORY:  Past Medical History:  Past Medical History:  Diagnosis Date   Angina pectoris ([place]) 10/06/2019   Anxiety    Arthritis    fingers   Cervical dysplasia    Coronary artery  disease    Depression    Diabetes mellitus    Type II   Dyspnea 11/22/2013   GERD (gastroesophageal reflux disease)    High cholesterol    History of kidney stones    passed   Hypertension    no meds now   Kidney stones    Moderate episode of recurrent major depressive disorder (HCC) 12/28/2019   Neuropathy    feet and legs   Nuclear sclerotic cataract of both eyes 09/2019   Dr. Shea Evans   PONV (postoperative nausea and vomiting)    one time   Sleep apnea    lost 70lbs no cpap x57yrs now   Stage 3a chronic kidney disease (HCC) 04/29/2020   Past Surgical History:  Past Surgical History:  Procedure Laterality Date   CARDIAC CATHETERIZATION     4-40yrs ago   CERVICAL CONIZATION W/BX N/A 05/04/2018   Procedure: CONIZATION CERVIX WITH BIOPSY - COLD KNIFE;  Surgeon: Hermina Staggers, MD;  Location: [facility];  Service: Gynecology;  Laterality: N/A;   CHOLECYSTECTOMY     CORONARY ARTERY BYPASS GRAFT N/A 10/24/2019    Procedure: CORONARY ARTERY BYPASS GRAFTING (CABG) times four, using left radial artery harvested endoscopically and right greater saphenous vein harvested endoscopically.;  Surgeon: Kerin Perna, MD;  Location: [facility] OR;  Service: Open Heart Surgery;  Laterality: N/A;   ELBOW FRACTURE SURGERY Left    ELBOW SURGERY Right    ENDOMETRIAL ABLATION     6 years ago   INCISION AND DRAINAGE     for boil- buttocks   INCISION AND DRAINAGE Left    Leg, Nephrotoxin fasciotimy   IRRIGATION AND DEBRIDEMENT ABSCESS Right 03/17/2017   Procedure: IRRIGATION AND DEBRIDEMENT RIGHT THIGH ABSCESS;  Surgeon: Karie Soda, MD;  Location: [facility] ORS;  Service: General;  Laterality: Right;   LAPAROSCOPIC APPENDECTOMY  10/04/2011   Procedure: APPENDECTOMY LAPAROSCOPIC;  Surgeon: Rulon Abide, DO;  Location: [facility]S;  Service: General;  Laterality: N/A;   LEFT HEART CATH AND CORONARY ANGIOGRAPHY N/A 10/06/2019   Procedure: LEFT HEART CATH AND CORONARY ANGIOGRAPHY;  Surgeon: Swaz[place], Peter M, MD;  Location: [facility];  Service: Cardiovascular;  Laterality: N/A;   RADIAL ARTERY HARVEST Left 10/24/2019   Procedure: Radial Artery Harvest;  Surgeon: Kerin Perna, MD;  Location: [facility] OR;  Service: Open Heart Surgery;  Laterality: Left;   TEE WITHOUT CARDIOVERSION N/A 10/24/2019   Procedure: TRANSESOPHAGEAL ECHOCARDIOGRAM (TEE);  Surgeon: Donata Clay, Theron Arista, MD;  Location: [facility] OR;  Service: Open Heart Surgery;  Laterality: N/A;   TENDON RECONSTRUCTION Right 07/02/2021   Procedure: Right wrist 6 dorsal compartment tenosynovectomy and tendon repair Right elbow lateral epicondylar debridement and tendon repair;  Surgeon: Bradly Bienenstock, MD;  Location: [facility] OR;  Service: Orthopedics;  Laterality: Right;   WRIST FRACTURE SURGERY Left    WRIST SURGERY Right    Social History:  reports that she quit smoking about 5 years ago. Her smoking use included e-cigarettes and cigarettes. She has a 48.00 pack-year smoking history. She  has never been exposed to tobacco smoke. She has never used smokeless tobacco. She reports current alcohol use. She reports that she does not use drugs. Family History:  Family History  Problem Relation Age of Onset   Aneurysm Mother    Pancreatic cancer Mother    Heart attack Father    Stroke Father    Heart disease Sister    Breast cancer Paternal Grandmother    Congestive  Heart Failure Sister    Osteoarthritis Sister    Osteoarthritis Sister      HOME MEDICATIONS: Allergies as of 10/05/2022       Reactions   Sulfa Antibiotics Hives   Codeine Itching        Medication List        Accurate as of October 05, 2022  7:35 AM. If you have any questions, ask your nurse or doctor.          STOP taking these medications    acetaminophen 500 MG tablet Commonly known as: TYLENOL Stopped by: Scarlette Shorts, MD   albuterol 108 (90 Base) MCG/ACT inhaler Commonly known as: VENTOLIN HFA Stopped by: Scarlette Shorts, MD   Lagevrio 200 MG Caps capsule Generic drug: molnupiravir EUA Stopped by: Scarlette Shorts, MD   NovoLOG 100 UNIT/ML injection Generic drug: insulin aspart Stopped by: Scarlette Shorts, MD       TAKE these medications    atorvastatin 80 MG tablet Commonly known as: LIPITOR TAKE 1 TABLET(80 MG) BY MOUTH DAILY AT 6 PM   dapagliflozin propanediol 10 MG Tabs tablet Commonly known as: Farxiga Take 1 tablet (10 mg total) by mouth daily before breakfast.   Dexcom G6 Sensor Misc 1 Device by Does not apply route as directed.   Dexcom G6 Transmitter Misc USE AS DIRECTED   docusate sodium 100 MG capsule Commonly known as: Colace Take 1 capsule (100 mg total) by mouth 2 (two) times daily.   esomeprazole 20 MG capsule Commonly known as: NEXIUM Take 20 mg by mouth daily.   ezetimibe 10 MG tablet Commonly known as: ZETIA TAKE 1 TABLET(10 MG) BY MOUTH EVERY EVENING   FLUoxetine 40 MG capsule Commonly known as: PROZAC TAKE 1  CAPSULE(40 MG) BY MOUTH DAILY What changed:  how much to take how to take this when to take this   fluticasone 50 MCG/ACT nasal spray Commonly known as: FLONASE Place 2 sprays into both nostrils daily as needed for allergies or rhinitis.   furosemide 20 MG tablet Commonly known as: LASIX TAKE 1 TABLET(20 MG) BY MOUTH DAILY   insulin lispro 100 UNIT/ML injection Commonly known as: HumaLOG INJECT A MAX OF 60 UNITS PER PUMP AS DIRECTED   Insulin Syringes (Disposable) U-100 1 ML Misc 1 Device by Does not apply route as directed.   Melatonin 10 MG Caps Take 10 mg by mouth at bedtime as needed (sleep).   metFORMIN 500 MG 24 hr tablet Commonly known as: GLUCOPHAGE-XR TAKE 2 TABLETS(1000 MG) BY MOUTH DAILY WITH BREAKFAST   metoprolol succinate 25 MG 24 hr tablet Commonly known as: TOPROL-XL Take 0.5 tablets (12.5 mg total) by mouth daily.   nitroGLYCERIN 0.4 MG SL tablet Commonly known as: NITROSTAT Place 1 tablet (0.4 mg total) under the tongue every 5 (five) minutes as needed for chest pain.   Omnipod 5 G6 Intro (Gen 5) Kit 1 Device by Does not apply route every other day.   Omnipod 5 G6 Pod (Gen 5) Misc 1 Device by Does not apply route every other day.   Ozempic (0.25 or 0.5 MG/DOSE) 2 MG/3ML Sopn Generic drug: Semaglutide(0.25 or 0.5MG /DOS) Inject 0.5 mg into the skin once a week.   pregabalin 300 MG capsule Commonly known as: LYRICA TAKE 1 CAPSULE BY MOUTH TWICE DAILY         OBJECTIVE:   Vital Signs: BP 120/80 (BP Location: Left Arm, Patient Position: Sitting, Cuff Size: Large)   Pulse 63  Ht 5' 8.5" (1.74 m)   Wt 289 lb (131.1 kg)   LMP  (LMP Unknown)   SpO2 98%   BMI 43.30 kg/m     Wt Readings from Last 3 Encounters:  10/05/22 289 lb (131.1 kg)  09/01/22 293 lb (132.9 kg)  04/17/22 296 lb (134.3 kg)     Exam: General: Pt appears well and is in NAD  Lungs: Clear with good BS bilat with no rales, rhonchi, or wheezes  Heart: RRR    Extremities: No pretibial edema  Neuro: MS is good with appropriate affect, pt is alert and Ox3      DM foot exam: 04/17/2022   The skin of the feet is intact without sores or ulcerations. The pedal pulses are undetectable on today's exam  The sensation is normal to a screening 5.07, 10 gram monofilament bilaterally   DATA REVIEWED:  Lab Results  Component Value Date   HGBA1C 10.5 (A) 10/05/2022   HGBA1C 10.3 (A) 04/17/2022   HGBA1C 7.7 (A) 11/27/2021     Latest Reference Range & Units 09/01/22 10:50  Sodium 134 - 144 mmol/L 142  Potassium 3.5 - 5.2 mmol/L 4.5  Chloride 96 - 106 mmol/L 101  CO2 20 - 29 mmol/L 25  Glucose 70 - 99 mg/dL 75  BUN 6 - 24 mg/dL 29 (H)  Creatinine 0.57 - 1.00 mg/dL 1.34 (H)  Calcium 8.7 - 10.2 mg/dL 9.7  BUN/Creatinine Ratio 9 - 23  22  eGFR >59 mL/min/1.73 47 (L)  Magnesium 1.6 - 2.3 mg/dL 2.2  (H): Data is abnormally high (L): Data is abnormally low   ASSESSMENT / PLAN / RECOMMENDATIONS:   1) Type 2 Diabetes Mellitus, Poorly   controlled, With Neuropathic, retinopathic , CKD III and macrovascular complications - Most recent A1c of 10.5 %. Goal A1c < 7.0 %.     -Pt with worsening glycemic control due to variable reasons , mainly due to lack of use of dexcom and not bolusing with meal  - She has intolerance to Victoza and Trulicity but doing well with Ozempic  - Since she does not count CHO, I have asked her to enter #5 rams with each meal from now on  - Will increase Ozempic  - Will change I:C ratio and decrease basal rate   MEDICATIONS: - Continue  Ozempic 0.5 mg weekly - Continue Metformin 500 mg XR , 2 tablet with breakfast  - Continue Farxiga  10mg  , 1 tablet daily with Breakfast     Pump   Omnipod Settings   Insulin type   Novolog     Basal rate       0000-0600 2.35 u/h    0600-0000 2.10          I:C ratio       0000 1:1 enter #5 [id number]                  Sensitivity       0000  20      Goal       0000  120      EDUCATION / INSTRUCTIONS: BG monitoring instructions: Patient is instructed to check her blood sugars 4 times a day, preprandial and bedtime Call [facility] if: BG persistently < 70  I reviewed the Rule of 15 for the treatment of hypoglycemia in detail with the patient. Literature supplied.     2) Diabetic complications:  Eye: Does  have known diabetic retinopathy.  Neuro/ Feet: Does  have known diabetic peripheral neuropathy .  Renal: Patient does not have known baseline CKD. She   is not on an ACEI/ARB at present.   3) Peripheral neuropathy   -Tolerating Lyrica Medication Continue Lyrica 300 twice daily  4) Dyslipidemia :   - Lipid panel at goal  Continue Atorvastatin 80 mg daily    F/U in 6 months    Signed electronically by: Mack Guise, MD  [facility] Endocrinology  [address], [place] [address] Phone: [phone] FAX: [phone]   CC: Glenis Smoker, MD [place] [address] Phone: [phone]  Fax: [phone]  Return to Endocrinology clinic as below: Future Appointments  Date Time Provider [place]  10/08/2022 10:00 AM Thomes Dinning, MD [facility] None  12/09/2022  8:00 AM Donato Heinz, MD [facility] None

## 2022-10-05 NOTE — Telephone Encounter (Signed)
Patient needs new PA process under her Medicaid for Dexcom sensors

## 2022-10-05 NOTE — Patient Instructions (Addendum)
-   Increase  Ozempic 1 mg weekly , - Continue Metformin 500 mg XR , 2 tablet with breakfast  - Continue  Farxiga 10 mg , 1 tablet daily with Breakfast     HOW TO TREAT LOW BLOOD SUGARS (Blood sugar LESS THAN 70 MG/DL) Please follow the RULE OF 15 for the treatment of hypoglycemia treatment (when your (blood sugars are less than 70 mg/dL)   STEP 1: Take 15 grams of carbohydrates when your blood sugar is low, which includes:  3-4 GLUCOSE TABS  OR 3-4 OZ OF JUICE OR REGULAR SODA OR ONE TUBE OF GLUCOSE GEL    STEP 2: RECHECK blood sugar in 15 MINUTES STEP 3: If your blood sugar is still low at the 15 minute recheck --> then, go back to STEP 1 and treat AGAIN with another 15 grams of carbohydrates.

## 2022-10-06 NOTE — Progress Notes (Signed)
Office: [phone]  /  Fax: [phone]   Initial Visit  [name] was seen in clinic today to evaluate for obesity. She is interested in losing weight to improve overall health and reduce the risk of weight related complications. She presents today to review program treatment options, initial physical assessment, and evaluation.     She was referred by: Specialist cardiologist  When asked what else they would like to accomplish? She states: Adopt healthier eating patterns, Improve energy levels and physical activity, Improve existing medical conditions, Reduce number of medications, and Improve quality of life  When asked how has your weight affected you? She states: Contributed to medical problems, Contributed to orthopedic problems or mobility issues, Having fatigue, and Having poor endurance  Some associated conditions: Hypertension, Hyperlipidemia, OSA, Diabetes, Heart disease, and Kidney disease  Contributing factors: Family history, Disruption of circadian rhythm, Nutritional, Medications, and Reduced physical activity  Weight promoting medications identified: Antiepileptics and Obesogenic diabetes medications  Current nutrition plan: None  Current level of physical activity: Limited due to chronic pain or orthopedic problems  Current or previous pharmacotherapy: GLP-1  Response to medication: Lost weight and was able to maintain weight loss   Past medical history includes:   Past Medical History:  Diagnosis Date   Angina pectoris ([place]) 10/06/2019   Anxiety    Arthritis    fingers   Cervical dysplasia    Coronary artery disease    Depression    Diabetes mellitus    Type II   Dyspnea 11/22/2013   GERD (gastroesophageal reflux disease)    High cholesterol    History of kidney stones    passed   Hypertension    no meds now   Kidney stones    Moderate episode of recurrent major depressive disorder ([place]) 12/28/2019   Neuropathy    feet and legs    Nuclear sclerotic cataract of both eyes 09/2019   Dr. Idolina Primer   PONV (postoperative nausea and vomiting)    one time   Sleep apnea    lost 70lbs no cpap x58yrs now   Stage 3a chronic kidney disease ([place]) 04/29/2020     Objective:   BP 138/82   Pulse 68   Temp 97.7 F (36.5 C)   Ht 5\' 9"  (1.753 m)   LMP  (LMP Unknown)   SpO2 98%   BMI 42.68 kg/m  She was weighed on the bioimpedance scale: Body mass index is 42.68 kg/m.  Peak Weight: 315, Body Fat%: 51, Visceral Fat Rating: 17, Weight trend over the last 12 months: Decreasing  General:  Alert, oriented and cooperative. Patient is in no acute distress.  Respiratory: Normal respiratory effort, no problems with respiration noted  Extremities: Normal range of motion.    Mental Status: Normal mood and affect. Normal behavior. Normal judgment and thought content.   DIAGNOSTIC DATA REVIEWED:  BMET    Component Value Date/Time   NA 142 09/01/2022 1050   K 4.5 09/01/2022 1050   CL 101 09/01/2022 1050   CO2 25 09/01/2022 1050   GLUCOSE 75 09/01/2022 1050   GLUCOSE 197 (H) 12/09/2021 1510   BUN 29 (H) 09/01/2022 1050   CREATININE 1.34 (H) 09/01/2022 1050   CALCIUM 9.7 09/01/2022 1050   GFRNONAA 43 (L) 12/09/2021 1510   GFRAA 51 (L) 10/18/2020 1054   Lab Results  Component Value Date   HGBA1C 10.5 (A) 10/05/2022   HGBA1C (H) 10/28/2010    6.4 (NOTE)  According to the ADA Clinical Practice Recommendations for 2011, when HbA1c is used as a screening test:   >=6.5%   Diagnostic of Diabetes Mellitus           (if abnormal result  is confirmed)  5.7-6.4%   Increased risk of developing Diabetes Mellitus  References:Diagnosis and Classification of Diabetes Mellitus,Diabetes AXEN,4076,80(SUPJS 1):S62-S69 and Standards of Medical Care in         Diabetes - 2011,Diabetes RPRX,4585,92  (Suppl 1):S11-S61.   No results found for: "INSULIN" CBC    Component Value Date/Time    WBC 6.1 12/09/2021 1510   RBC 5.32 (H) 12/09/2021 1510   HGB 12.6 12/09/2021 1510   HGB 14.2 08/27/2020 1227   HCT 42.2 12/09/2021 1510   HCT 44.7 08/27/2020 1227   PLT 173 12/09/2021 1510   PLT 235 08/27/2020 1227   MCV 79.3 (L) 12/09/2021 1510   MCV 83 08/27/2020 1227   MCH 23.7 (L) 12/09/2021 1510   MCHC 29.9 (L) 12/09/2021 1510   RDW 15.7 (H) 12/09/2021 1510   RDW 12.3 08/27/2020 1227   Iron/TIBC/Ferritin/ %Sat No results found for: "IRON", "TIBC", "FERRITIN", "IRONPCTSAT" Lipid Panel     Component Value Date/Time   CHOL 147 07/30/2021 1213   CHOL 152 08/27/2020 1227   TRIG 128.0 07/30/2021 1213   HDL 68.70 07/30/2021 1213   HDL 76 08/27/2020 1227   CHOLHDL 2 07/30/2021 1213   VLDL 25.6 07/30/2021 1213   LDLCALC 53 07/30/2021 1213   LDLCALC 60 08/27/2020 1227   Hepatic Function Panel     Component Value Date/Time   PROT 6.4 (L) 12/27/2020 1333   PROT 6.8 08/27/2020 1227   ALBUMIN 3.8 12/27/2020 1333   ALBUMIN 4.3 08/27/2020 1227   AST 17 12/27/2020 1333   ALT 18 12/27/2020 1333   ALKPHOS 88 12/27/2020 1333   BILITOT 0.4 12/27/2020 1333   BILITOT 0.2 08/27/2020 1227   BILIDIR 0.04 06/05/2019 1039   IBILI 0.6 02/15/2018 1034      Component Value Date/Time   TSH 2.090 08/27/2020 1227     Assessment and Plan:  1. OSA (obstructive sleep apnea) Untreated.  Patient counseled on the risk associated with untreated sleep apnea.  Losing 15% of body weight may improve AHI.  2. Primary hypertension Blood pressure slightly above target.  She has stage IIIa chronic kidney disease based on recent labs.  Her blood pressure goal should be less than 120/80.  Patient advised to avoid nephrotoxins also maintaining adequate hydration.  She was advised to monitor blood pressure.  Losing 10% of body weight may improve blood pressure control.  3. Type 2 diabetes mellitus with hyperglycemia, with long-term current use of insulin (HCC) Her most recent A1c is 10.5 which suggests  an adequate control.  She is currently working with endocrinology and her medications are being adjusted.  She is currently on insulin as well as SGLT2 and GLP-1.  She denies any adverse effects.  Her reduced calorie meal plan she will improve glycemic control.  We will continue to work with patient from a weight management standpoint.  4. Class 3 severe obesity with serious comorbidity and body mass index (BMI) of 40.0 to 44.9 in adult, unspecified obesity type ([place]) We reviewed weight, biometrics, associated medical conditions and contributing factors with patient. She would benefit from weight loss therapy via a modified calorie, low-carb, high-protein nutritional plan tailored to their REE (resting energy expenditure) which will be determined by indirect calorimetry.  We will also assess  for cardiometabolic risk and nutritional derangements via fasting serologies at her next appointment.       Obesity Treatment / Action Plan:  Patient will work on garnering support from family and friends to begin weight loss journey. Will work on eliminating or reducing the presence of highly palatable, calorie dense foods in the home. Will complete provided nutritional and psychosocial assessment questionnaire before the next appointment. Will be scheduled for indirect calorimetry to determine resting energy expenditure in a fasting state.  This will allow [place] to create a reduced calorie, high-protein meal plan to promote loss of fat mass while preserving muscle mass. Counseled on the health benefits of losing 5%-15% of total body weight. Was counseled on nutritional approaches to weight loss and benefits of complex carbs and high quality protein as part of nutritional weight management. Was counseled on pharmacotherapy and role as an adjunct in weight management.   Obesity Education Performed Today:  She was weighed on the bioimpedance scale and results were discussed and documented in the synopsis.  We  discussed obesity as a disease and the importance of a more detailed evaluation of all the factors contributing to the disease.  We discussed the importance of long term lifestyle changes which include nutrition, exercise and behavioral modifications as well as the importance of customizing this to her specific health and social needs.  We discussed the benefits of reaching a healthier weight to alleviate the symptoms of existing conditions and reduce the risks of the biomechanical, metabolic and psychological effects of obesity.  PALOMA GRANGE appears to be in the action stage of change and states they are ready to start intensive lifestyle modifications and behavioral modifications.  30 minutes was spent today on this visit including the above counseling, pre-visit chart review, and post-visit documentation.  Reviewed by clinician on day of visit: allergies, medications, problem list, medical history, surgical history, family history, social history, and previous encounter notes.    I have reviewed the above documentation for accuracy and completeness, and I agree with the above.  Thomes Dinning, MD

## 2022-10-07 ENCOUNTER — Telehealth: Payer: Self-pay

## 2022-10-07 NOTE — Telephone Encounter (Signed)
PA has been APPROVED from 10/07/2022-04/04/2023

## 2022-10-07 NOTE — Telephone Encounter (Signed)
PA request received via providers office for [employer].  PA has been submitted to [facility] via CMM and is pending determination.   Key: [id number]

## 2022-10-07 NOTE — Telephone Encounter (Signed)
PA submitted under Medicaid and is pending determination.

## 2022-10-08 ENCOUNTER — Encounter (INDEPENDENT_AMBULATORY_CARE_PROVIDER_SITE_OTHER): Payer: Medicaid Other | Admitting: Internal Medicine

## 2022-10-13 ENCOUNTER — Other Ambulatory Visit (HOSPITAL_COMMUNITY): Payer: Self-pay

## 2022-10-13 NOTE — Telephone Encounter (Signed)
Transmitter also goes through test claim at $0 copay.

## 2022-11-06 ENCOUNTER — Other Ambulatory Visit (HOSPITAL_COMMUNITY): Payer: Self-pay

## 2022-11-06 ENCOUNTER — Telehealth: Payer: Self-pay

## 2022-11-06 NOTE — Telephone Encounter (Signed)
Patient Advocate Encounter   Received notification from [facility] that prior authorization is required for Farxiga '10MG'$  tablets  Submitted: 11-06-2022 Key [id number]   Status is pending

## 2022-11-10 ENCOUNTER — Other Ambulatory Visit (HOSPITAL_COMMUNITY): Payer: Self-pay

## 2022-11-10 NOTE — Telephone Encounter (Signed)
Pharmacy Patient Advocate Encounter  Prior Authorization for Farxiga '10mg'$  has been approved by Marcelino Freestone (ins).    PA # [employer] Effective dates: 11/06/22 through 11/06/23  Filled 10/09/22

## 2022-11-13 ENCOUNTER — Telehealth: Payer: Self-pay | Admitting: Rheumatology

## 2022-11-13 NOTE — Telephone Encounter (Signed)
Noted  

## 2022-11-13 NOTE — Telephone Encounter (Signed)
FYI: Patient states she had labwork on Monday, 11/09/22 with her PCP Dr. Lindell Noe at [facility].  Patient states the office will be faxing over the results so Dr. Estanislado Pandy can review them before her appointment on 11/30/22 at 11:00 am.

## 2022-11-16 NOTE — Progress Notes (Signed)
Office Visit Note  Patient: [name]             Date of Birth: [birth year]           MRN: [id number]             PCP: Glenis Smoker, MD Referring: Glenis Smoker, * Visit Date: 11/30/2022 Occupation: @GUAROCC @  Subjective:  Pain in multiple joints and rash  History of Present Illness: [name] is a 52 y.o. female history of osteoarthritis.  She states about 4 weeks ago she developed increased pain and discomfort in almost all of her joints.  She was seen by her PCP and had some labs.  She states after that she was referred here.  He states with this episode she developed a rash on her scalp and her back.  She also developed a patchy hair loss.  She noticed rash on her face.  She continues to have pain and burning sensation in her hands especially her right ring finger which has been triggering.  Scribes discomfort in her left middle finger.  Continues to have discomfort in her knee joints, feet and her ankles.    Activities of Daily Living:  Patient reports morning stiffness for 1 hour.   Patient Reports nocturnal pain.  Difficulty dressing/grooming: Denies Difficulty climbing stairs: Reports Difficulty getting out of chair: Reports Difficulty using hands for taps, buttons, cutlery, and/or writing: Reports  Review of Systems  Constitutional:  Positive for fatigue.  HENT:  Positive for mouth dryness. Negative for mouth sores.   Eyes:  Positive for dryness.  Respiratory:  Negative for shortness of breath.   Cardiovascular:  Negative for chest pain and palpitations.  Gastrointestinal:  Positive for diarrhea. Negative for blood in stool and constipation.  Endocrine: Positive for increased urination.  Genitourinary:  Positive for involuntary urination.  Musculoskeletal:  Positive for joint pain, gait problem, joint pain, joint swelling, myalgias, muscle weakness, morning stiffness, muscle tenderness and myalgias.  Skin:  Positive for rash, hair loss and  sensitivity to sunlight. Negative for color change.  Allergic/Immunologic: Positive for susceptible to infections.  Neurological:  Positive for dizziness and headaches.  Hematological:  Negative for swollen glands.  Psychiatric/Behavioral:  Positive for sleep disturbance. Negative for depressed mood. The patient is not nervous/anxious.     PMFS History:  Patient Active Problem List   Diagnosis Date Noted   Class 3 severe obesity with serious comorbidity and body mass index (BMI) of 40.0 to 44.9 in adult 10/06/2022   Polyneuropathy associated with underlying disease 07/30/2021   Dehydration 12/27/2020   Type 2 diabetes mellitus with hyperglycemia, with long-term current use of insulin 11/11/2020   Stage 3a chronic kidney disease 04/29/2020   COVID-19 virus vaccination declined 04/29/2020   Influenza vaccination declined 04/29/2020   Midline sternotomy scar 04/17/2020   Left rib fracture 03/13/2020   Moderate episode of recurrent major depressive disorder 12/28/2019   Encounter for post surgical wound check 12/06/2019   Type 2 diabetes mellitus with diabetic polyneuropathy, with long-term current use of insulin 11/16/2019   Diabetes mellitus 11/16/2019   Type 2 diabetes mellitus with retinopathy, with long-term current use of insulin 11/16/2019   Dyslipidemia 11/16/2019   Postop check 11/15/2019   Diabetic retinopathy 10/25/2019   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   Angina pectoris 10/06/2019   Left-sided chest pain 06/13/2019   Post-operative state 06/06/2018   HGSIL (high grade squamous intraepithelial lesion) on Pap smear of cervix 02/25/2018  Right upper quadrant abdominal pain 12/30/2017   Polyarthritis 12/30/2017   OSA (obstructive sleep apnea) 11/22/2013   Dyspnea 11/22/2013   Uncontrolled type 2 diabetes mellitus with hyperosmolar nonketotic hyperglycemia 07/21/2012   HTN (hypertension) 07/21/2012   Smoker 07/21/2012    Past Medical History:  Diagnosis Date   Angina pectoris  10/06/2019   Anxiety    Arthritis    fingers   Cervical dysplasia    Coronary artery disease    Depression    Diabetes mellitus    Type II   Dyspnea 11/22/2013   GERD (gastroesophageal reflux disease)    High cholesterol    History of kidney stones    passed   Hypertension    no meds now   Kidney stones    Moderate episode of recurrent major depressive disorder 12/28/2019   Neuropathy    feet and legs   Nuclear sclerotic cataract of both eyes 09/2019   Dr. Idolina Primer   PONV (postoperative nausea and vomiting)    one time   Sleep apnea    lost 70lbs no cpap x22yrs now   Stage 3a chronic kidney disease 04/29/2020    Family History  Problem Relation Age of Onset   Aneurysm Mother    Pancreatic cancer Mother    Heart attack Father    Stroke Father    Heart disease Sister    Breast cancer Paternal Grandmother    Congestive Heart Failure Sister    Osteoarthritis Sister    Osteoarthritis Sister    Past Surgical History:  Procedure Laterality Date   CARDIAC CATHETERIZATION     4-46yrs ago   CERVICAL CONIZATION W/BX N/A 05/04/2018   Procedure: CONIZATION CERVIX WITH BIOPSY - COLD KNIFE;  Surgeon: Chancy Milroy, MD;  Location: [facility];  Service: Gynecology;  Laterality: N/A;   CHOLECYSTECTOMY     CORONARY ARTERY BYPASS GRAFT N/A 10/24/2019   Procedure: CORONARY ARTERY BYPASS GRAFTING (CABG) times four, using left radial artery harvested endoscopically and right greater saphenous vein harvested endoscopically.;  Surgeon: Ivin Poot, MD;  Location: [facility];  Service: Open Heart Surgery;  Laterality: N/A;   ELBOW FRACTURE SURGERY Left    ELBOW SURGERY Right    ENDOMETRIAL ABLATION     6 years ago   INCISION AND DRAINAGE     for boil- buttocks   INCISION AND DRAINAGE Left    Leg, Nephrotoxin fasciotimy   IRRIGATION AND DEBRIDEMENT ABSCESS Right 03/17/2017   Procedure: IRRIGATION AND DEBRIDEMENT RIGHT THIGH ABSCESS;  Surgeon: Michael Boston, MD;  Location:  [facility];  Service: General;  Laterality: Right;   LAPAROSCOPIC APPENDECTOMY  10/04/2011   Procedure: APPENDECTOMY LAPAROSCOPIC;  Surgeon: Judieth Keens, DO;  Location: [facility];  Service: General;  Laterality: N/A;   LEFT HEART CATH AND CORONARY ANGIOGRAPHY N/A 10/06/2019   Procedure: LEFT HEART CATH AND CORONARY ANGIOGRAPHY;  Surgeon: Martinique, Peter M, MD;  Location: [facility];  Service: Cardiovascular;  Laterality: N/A;   RADIAL ARTERY HARVEST Left 10/24/2019   Procedure: Radial Artery Harvest;  Surgeon: Ivin Poot, MD;  Location: [facility];  Service: Open Heart Surgery;  Laterality: Left;   TEE WITHOUT CARDIOVERSION N/A 10/24/2019   Procedure: TRANSESOPHAGEAL ECHOCARDIOGRAM (TEE);  Surgeon: Prescott Gum, Collier Salina, MD;  Location: [facility];  Service: Open Heart Surgery;  Laterality: N/A;   TENDON RECONSTRUCTION Right 07/02/2021   Procedure: Right wrist 6 dorsal compartment tenosynovectomy and tendon repair Right elbow lateral epicondylar debridement and tendon  repair;  Surgeon: Iran Planas, MD;  Location: [facility];  Service: Orthopedics;  Laterality: Right;   WRIST FRACTURE SURGERY Left    WRIST SURGERY Right    Social History   Social History Narrative   Not on file   Immunization History  Administered Date(s) Administered   Influenza Split 05/31/2013   Influenza,inj,Quad PF,6+ Mos 06/05/2019   Pneumococcal Polysaccharide-23 03/29/2017   Tdap 01/27/2018     Objective: Vital Signs: BP 114/83 (BP Location: Right Arm, Patient Position: Sitting, Cuff Size: Normal)   Pulse 60   Resp 15   Ht 5\' 9"  (1.753 m)   Wt 288 lb (130.6 kg)   LMP  (LMP Unknown)   BMI 42.53 kg/m    Physical Exam Vitals and nursing note reviewed.  Constitutional:      Appearance: She is well-developed.  HENT:     Head: Normocephalic and atraumatic.  Eyes:     Conjunctiva/sclera: Conjunctivae normal.  Cardiovascular:     Rate and Rhythm: Normal rate and regular rhythm.     Heart sounds: Normal heart  sounds.  Pulmonary:     Effort: Pulmonary effort is normal.     Breath sounds: Normal breath sounds.  Abdominal:     General: Bowel sounds are normal.     Palpations: Abdomen is soft.  Musculoskeletal:     Cervical back: Normal range of motion.     Right lower leg: Edema present.     Left lower leg: Edema present.  Lymphadenopathy:     Cervical: No cervical adenopathy.  Skin:    General: Skin is warm and dry.     Capillary Refill: Capillary refill takes less than 2 seconds.     Comments: Schamberg's purpura was noted.  Neurological:     Mental Status: She is alert and oriented to person, place, and time.  Psychiatric:        Behavior: Behavior normal.      Musculoskeletal Exam: Cervical spine was in good range of motion.  Shoulders, elbows, wrist joints, MCPs PIPs and DIPs been good range of motion.  She had bilateral PIP and DIP thickening.  She had right ring and left middle trigger fingers.  Hip joints and knee joints with good range of motion without any warmth swelling or effusion.  There was no tenderness over ankles or MTPs.  CDAI Exam: CDAI Score: -- Patient Global: --; Provider Global: -- Swollen: --; Tender: -- Joint Exam 11/30/2022   No joint exam has been documented for this visit   There is currently no information documented on the homunculus. Go to the Rheumatology activity and complete the homunculus joint exam.  Investigation: No additional findings.  Imaging: No results found.  Recent Labs: Lab Results  Component Value Date   WBC 6.1 12/09/2021   HGB 12.6 12/09/2021   PLT 173 12/09/2021   NA 142 09/01/2022   K 4.5 09/01/2022   CL 101 09/01/2022   CO2 25 09/01/2022   GLUCOSE 75 09/01/2022   BUN 29 (H) 09/01/2022   CREATININE 1.34 (H) 09/01/2022   BILITOT 0.4 12/27/2020   ALKPHOS 88 12/27/2020   AST 17 12/27/2020   ALT 18 12/27/2020   PROT 6.4 (L) 12/27/2020   ALBUMIN 3.8 12/27/2020   CALCIUM 9.7 09/01/2022   GFRAA 51 (L) 10/18/2020    November 10, 2022 TSH normal, CBC WBC 5.1 hemoglobin 14.6 platelets 172, CMP normal, UA negative ESR 6, RF negative, ANA negative  Speciality Comments: No specialty comments available.  Procedures:  No procedures performed Allergies: Sulfa antibiotics and Codeine   Assessment / Plan:     Visit Diagnoses: Primary osteoarthritis of both hands -she complains of increased discomfort in her hands.-clinical and radiographic findings were previously consistent with osteoarthritis.  She had bilateral PIP and DIP thickening.  Joint protection muscle strengthening was discussed.  She complains of pain and discomfort in multiple joints today.  Labs obtained by her PCP on November 10, 2022 were reviewed.  Rheumatoid factor and ANA were negative.  Sed rate was normal.  Labs were reviewed with the patient.  Trigger finger, left middle finger-she complains of intermittent triggering of her left middle finger.  Flexor tendon thickening was noted.  I will schedule ultrasound-guided trigger finger injection.  Use of topical Voltaren gel was discussed.  Trigger finger, right ring finger-she has intermittent triggering of the right ring finger.  Flexor tendon thickening was noted.  Will schedule ultrasound-guided injection.  Use of topical Voltaren gel was advised.  Trochanteric bursitis of both hips - -IT band muscle strain revealed on MRI.  Under care of Dr. Stann Mainland.  It band syndrome, right - MRI-Significant proximal IT band muscle strain at the iliac fascia lata muscle at the iliac crest.  Under care of Dr. Stann Mainland  Chronic pain of left knee - meniscus tear repair October 2023 by Dr. Stann Mainland.  She has intermittent discomfort.  No warmth swelling or effusion was noted.  Impetigo-she developed a rash on her scalp.  I noticed impetigo lesions on her scalp, forehead and her forearms.  Patient states she is unable to schedule appointment with her PCP quickly.  She requested an antibiotic.  Patient states that she has  taken Keflex in the past.  A prescription for Keflex 500 mg p.o. twice daily total 10-day prescription was sent.  Side effects of Keflex were discussed and a handout was placed in the AVS.  Information on impetigo was also placed in the AVS.  Primary osteoarthritis of both feet-she complains of discomfort in her feet.  No synovitis was noted.  She had bilateral pedal edema.  Frequent falls - DEXA updated on 02/16/22: The BMD measured at DualFemur Neck Right is 0.982 g/cm2 with a T-score of -0.4.  Other medical problems are listed as follows:  Essential hypertension-blood pressure was normal at 114/83.  Uncontrolled type 2 diabetes mellitus with hyperosmolar nonketotic hyperglycemia  S/P CABG x 4  History of gastroesophageal reflux (GERD)  History of kidney stones  Dyslipidemia  Nuclear sclerotic cataract of both eyes  HGSIL (high grade squamous intraepithelial lesion) on Pap smear of cervix  Anxiety and depression  OSA (obstructive sleep apnea)  Former smoker  Encounter for post surgical wound check  Orders: No orders of the defined types were placed in this encounter.  Meds ordered this encounter  Medications   cephALEXin (KEFLEX) 500 MG capsule    Sig: Take 1 capsule (500 mg total) by mouth 2 (two) times daily.    Dispense:  20 capsule    Refill:  0    Follow-Up Instructions: Return in about 6 months (around 06/01/2023) for Osteoarthritis, trigger finger.   Bo Merino, MD  Note - This record has been created using Editor, commissioning.  Chart creation errors have been sought, but may not always  have been located. Such creation errors do not reflect on  the standard of medical care.

## 2022-11-30 ENCOUNTER — Encounter: Payer: Self-pay | Admitting: Rheumatology

## 2022-11-30 ENCOUNTER — Ambulatory Visit: Payer: Medicaid Other | Attending: Rheumatology | Admitting: Rheumatology

## 2022-11-30 VITALS — BP 114/83 | HR 60 | Resp 15 | Ht 69.0 in | Wt 288.0 lb

## 2022-11-30 DIAGNOSIS — G4733 Obstructive sleep apnea (adult) (pediatric): Secondary | ICD-10-CM

## 2022-11-30 DIAGNOSIS — F32A Depression, unspecified: Secondary | ICD-10-CM

## 2022-11-30 DIAGNOSIS — M65332 Trigger finger, left middle finger: Secondary | ICD-10-CM | POA: Diagnosis not present

## 2022-11-30 DIAGNOSIS — M7061 Trochanteric bursitis, right hip: Secondary | ICD-10-CM | POA: Diagnosis not present

## 2022-11-30 DIAGNOSIS — M19041 Primary osteoarthritis, right hand: Secondary | ICD-10-CM

## 2022-11-30 DIAGNOSIS — I10 Essential (primary) hypertension: Secondary | ICD-10-CM

## 2022-11-30 DIAGNOSIS — L0100 Impetigo, unspecified: Secondary | ICD-10-CM

## 2022-11-30 DIAGNOSIS — M19072 Primary osteoarthritis, left ankle and foot: Secondary | ICD-10-CM

## 2022-11-30 DIAGNOSIS — H2513 Age-related nuclear cataract, bilateral: Secondary | ICD-10-CM

## 2022-11-30 DIAGNOSIS — Z87891 Personal history of nicotine dependence: Secondary | ICD-10-CM

## 2022-11-30 DIAGNOSIS — M65341 Trigger finger, right ring finger: Secondary | ICD-10-CM

## 2022-11-30 DIAGNOSIS — M7062 Trochanteric bursitis, left hip: Secondary | ICD-10-CM

## 2022-11-30 DIAGNOSIS — Z4889 Encounter for other specified surgical aftercare: Secondary | ICD-10-CM

## 2022-11-30 DIAGNOSIS — M19042 Primary osteoarthritis, left hand: Secondary | ICD-10-CM

## 2022-11-30 DIAGNOSIS — E785 Hyperlipidemia, unspecified: Secondary | ICD-10-CM

## 2022-11-30 DIAGNOSIS — Z8719 Personal history of other diseases of the digestive system: Secondary | ICD-10-CM

## 2022-11-30 DIAGNOSIS — F419 Anxiety disorder, unspecified: Secondary | ICD-10-CM

## 2022-11-30 DIAGNOSIS — R87613 High grade squamous intraepithelial lesion on cytologic smear of cervix (HGSIL): Secondary | ICD-10-CM

## 2022-11-30 DIAGNOSIS — G8929 Other chronic pain: Secondary | ICD-10-CM

## 2022-11-30 DIAGNOSIS — M7631 Iliotibial band syndrome, right leg: Secondary | ICD-10-CM

## 2022-11-30 DIAGNOSIS — M25562 Pain in left knee: Secondary | ICD-10-CM

## 2022-11-30 DIAGNOSIS — Z951 Presence of aortocoronary bypass graft: Secondary | ICD-10-CM

## 2022-11-30 DIAGNOSIS — M19071 Primary osteoarthritis, right ankle and foot: Secondary | ICD-10-CM

## 2022-11-30 DIAGNOSIS — E1100 Type 2 diabetes mellitus with hyperosmolarity without nonketotic hyperglycemic-hyperosmolar coma (NKHHC): Secondary | ICD-10-CM

## 2022-11-30 DIAGNOSIS — Z87442 Personal history of urinary calculi: Secondary | ICD-10-CM

## 2022-11-30 DIAGNOSIS — R296 Repeated falls: Secondary | ICD-10-CM

## 2022-11-30 MED ORDER — CEPHALEXIN 500 MG PO CAPS: 500.0000 mg | ORAL_CAPSULE | Freq: Two times a day (BID) | ORAL | 0 refills | Status: DC

## 2022-11-30 NOTE — Patient Instructions (Signed)
Cephalexin Capsules or Tablets What is this medication? CEPHALEXIN (sef a LEX in) treats infections caused by bacteria. It belongs to a group of medications called cephalosporin antibiotics. It will not treat colds, the flu, or infections caused by viruses. This medicine may be used for other purposes; ask your health care provider or pharmacist if you have questions. COMMON BRAND NAME(S): Biocef, Daxbia, Keflex, Keftab What should I tell my care team before I take this medication? They need to know if you have any of these conditions: Bleeding disorder Kidney disease Liver disease Seizures Stomach or intestine problems like colitis An unusual or allergic reaction to cephalexin, other penicillin or cephalosporin antibiotics, other medications, foods, dyes, or preservatives Pregnant or trying to get pregnant Breast-feeding How should I use this medication? Take this medication by mouth. Take it as directed on the prescription label at the same time every day. You can take it with or without food. If it upsets your stomach, take it with food. Take all of this medication unless your care team tells you to stop it early. Keep taking it even if you think you are better. Talk to your care team about the use of this medication in children. While it may be prescribed for selected conditions, precautions do apply. Overdosage: If you think you have taken too much of this medicine contact a poison control center or emergency room at once. NOTE: This medicine is only for you. Do not share this medicine with others. What if I miss a dose? If you miss a dose, take it as soon as you can. If it is almost time for your next dose, take only that dose. Do not take double or extra doses. What may interact with this medication? Probenecid Some other antibiotics This list may not describe all possible interactions. Give your health care provider a list of all the medicines, herbs, non-prescription drugs, or  dietary supplements you use. Also tell them if you smoke, drink alcohol, or use illegal drugs. Some items may interact with your medicine. What should I watch for while using this medication? Tell your care team if your symptoms do not start to get better or if they get worse. Do not treat diarrhea with over the counter products. Contact your care team if you have diarrhea that lasts more than 2 days or if it is severe and watery. This medication may cause serious skin reactions. They can happen weeks to months after starting the medication. Contact your care team right away if you notice fevers or flu-like symptoms with a rash. The rash may be red or purple and then turn into blisters or peeling of the skin. Or, you might notice a red rash with swelling of the face, lips or lymph nodes in your neck or under your arms. If you have diabetes, you may get a false-positive result for sugar in your urine. Check with your care team. What side effects may I notice from receiving this medication? Side effects that you should report to your care team as soon as possible: Allergic reactions--skin rash, itching, hives, swelling of the face, lips, tongue, or throat Redness, blistering, peeling, or loosening of the skin, including inside the mouth Severe diarrhea, fever Unusual vaginal discharge, itching, or odor Side effects that usually do not require medical attention (report to your care team if they continue or are bothersome): Diarrhea Headache Nausea This list may not describe all possible side effects. Call your doctor for medical advice about side effects. You may  report side effects to FDA at 1-800-FDA-1088. Where should I keep my medication? Keep out of the reach of children and pets. Store at room temperature between 20 and 25 degrees C (68 and 77 degrees F). Throw away any unused medication after the expiration date. NOTE: This sheet is a summary. It may not cover all possible information. If you  have questions about this medicine, talk to your doctor, pharmacist, or health care provider.  2023 Elsevier/Gold Standard (2020-08-12 00:00:00)  Impetigo, Adult Impetigo is an infection of the skin. It commonly occurs in young children, but it can also occur in adults. The infection causes itchy blisters and sores that produce brownish-yellow fluid. As the fluid dries, it forms a thick, honey-colored crust. These skin changes usually occur on the face, but they can also affect other areas of the body. Impetigo usually goes away in 7-10 days with treatment. What are the causes? This condition is caused by two types of bacteria. It may be caused by staphylococci or streptococci bacteria. These bacteria cause impetigo when they get under the surface of the skin. This often happens after some damage to the skin, such as: Cuts, scrapes, or scratches. Rashes. Insect bites, especially when you scratch the area of a bite. Chickenpox or other illnesses that cause open skin sores. Nail biting or chewing. Impetigo can spread easily from one person to another (is contagious). It may be spread through close skin contact or by sharing towels, clothing, or other items that an infected person has touched. Scratching the affected area can cause impetigo to spread to other parts of the body. The bacteria can get under your fingernails and spread when you touch another area of your skin. What increases the risk? The following factors may make you more likely to develop this condition: Playing sports that include skin-to-skin contact with others. Having broken skin, such as from a cut or scrape. Living in an area that has high humidity levels. Having poor hygiene. Having high levels of staphylococci in your nose. Having a condition that weakens the skin integrity, such as: Having a weak body defense system (immune system). Having a skin condition with open sores, such as chickenpox. Having diabetes. What are  the signs or symptoms? The main symptom of this condition is small blisters, often on the face around the mouth and nose. In time, the blisters break open and turn into tiny sores (lesions) with a yellow crust. In some cases, the blisters cause itching or burning. Scratching, irritation, or lack of treatment may cause these small lesions to get larger. Other possible symptoms include: Larger blisters. Pus. Swollen lymph glands. How is this diagnosed? This condition is usually diagnosed during a physical exam. A skin sample or a sample of fluid from a blister may be taken for lab tests that involve growing bacteria (culture test). Lab tests can help confirm the diagnosis or help determine the best treatment. How is this treated? Treatment for this condition depends on the severity of the condition: Mild impetigo can be treated with prescription antibiotic cream. Oral antibiotic medicine may be used in more severe cases. Medicines that reduce itchiness (antihistamines)may also be used. Follow these instructions at home: Medicines Take over-the-counter and prescription medicines only as told by your health care provider. Apply or take your antibiotic as told by your health care provider. Do not stop using the antibiotic even if your condition improves. Before applying antibiotic cream or ointment, you should: Gently wash the infected areas with antibacterial soap  and warm water. Soak crusted areas in warm, soapy water using antibacterial soap. Gently rub the areas to remove crusts. Do not scrub. Preventing the spread of infection  To help prevent impetigo from spreading to other body areas: Keep your fingernails short and clean. Do not scratch the blisters or sores. Cover infected areas, if necessary, to keep from scratching. Wash your hands often with soap and warm water. To help prevent impetigo from spreading to other people: Do not share towels. Wash your clothing and bedsheets in  water that is 140F (60C) or warmer. Stay home until you have used an antibiotic cream for 48 hours (2 days) or an oral antibiotic medicine for 24 hours (1 day). You should only return to work and activities with other people if your skin shows significant improvement. You may return to contact sports after you have used antibiotic medicine for 72 hours (3 days). General instructions Keep all follow-up visits. This is important. How is this prevented? Wash your hands often with soap and warm water. Do not share towels, washcloths, clothing, bedding, or razors. Keep your fingernails short. Keep any cuts, scrapes, bug bites, or rashes clean and covered. Use insect repellent to prevent bug bites. Contact a health care provider if: You develop more blisters or sores, even with treatment. Other family members get sores. Your skin sores are not improving after 72 hours (3 days) of treatment. You have a fever. Get help right away if: You see spreading redness or swelling of the skin around your sores. You develop a sore throat. The area around your rash becomes warm, red, or tender to the touch. You have dark, reddish-brown urine. You do not urinate often or you urinate small amounts. You are very tired (lethargic). You have swelling in your face, hands, or feet. Summary Impetigo is a skin infection that causes itchy blisters and sores that produce brownish-yellow fluid. As the fluid dries, it forms a crust. This condition is caused by staphylococci or streptococci bacteria. These bacteria cause impetigo when they get under the surface of the skin, such as through cuts, rashes, bug bites, or open sores. Treatment for this condition may include antibiotic ointment or oral antibiotics. To help prevent impetigo from spreading to other body areas, make sure you keep your fingernails short, avoid scratching, cover any blisters, and wash your hands often. If you have impetigo, stay home until you  have used an antibiotic cream for 48 hours (2 days) or an oral antibiotic medicine for 24 hours (1 day). You should only return to work and activities with other people if your skin shows significant improvement. This information is not intended to replace advice given to you by your health care provider. Make sure you discuss any questions you have with your health care provider. Document Revised: 01/17/2020 Document Reviewed: 01/17/2020 Elsevier Patient Education  [place].

## 2022-12-06 NOTE — Progress Notes (Deleted)
Cardiology Office Note:    Date:  12/06/2022   ID:  [name], DOB May 15, 1971, MRN [id number]  PCP:  [name], MD  Cardiologist:  Little Ishikawa, MD  Electrophysiologist:  None   Referring MD: [name], *   No chief complaint on file.   History of Present Illness:    [name] is a 52 y.o. female with a hx of coronary artery disease status post CABG x4 on 10/24/2019 (LIMA-LAD, radial-diagonal, SVG-diagonal, SVG-PDA), type 2 diabetes, hypertension, hyperlipidemia who presents for a follow-up evaluation.  Patient was admitted to [facility] on 06/13/19 with chest pain.  EKG was unremarkable and high-sensitivity troponins were negative.  However due to her extensive risk factors including uncontrolled diabetes, coronary CTA was ordered.  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  She was discharged on medical management: atorvastatin 80 mg, Imdur 30 mg.  TTE showed EF 60-65%.  During subsequent office visits, her antianginal regimen was titrated as she continued to report chest pain.  At last clinic visit on 10/02/2019, she was on metoprolol 25 mg twice daily and Imdur 60 mg daily, but further titration was limited by low blood pressures.  Given inability to further titrate up antianginals and having persistent chest pain, cardiac catheterization was ordered.  Underwent cath on 10/06/2019, which showed three-vessel CAD: 80% mid LAD, 85% slitlike ostial D1, 80% ostial D2, 80% small PDA, anomalous circumflex off the RCA cusp with moderate diffuse disease.  Recommendation was for aggressive medical management and if symptoms persist consider surgical  consultation.  She continued to have chest pain despite medical management, so was referred to cardiac surgery.  She underwent CABG x4 on 10/24/2019 (LIMA-LAD, radial-diagonal, SVG-diagonal, SVG-PDA) with Dr. Donata Clay.  Echocardiogram on 09/20/2018 showed biventricular function, no significant valvular disease.  Stress CMR on 11/05/2020 showed no evidence of ischemia, no LGE, LVEF 61%, RVEF 60%.  Stress PET on 09/29/2022 showed normal perfusion, EF 62%.  Since last clinic visit,  she reports has been having chest pain recently.  Reports an episode of left-sided chest pain that lasted all day.  Had another episode a few days later.  Describes as feeling like pressure on left side of chest.  She has not been exercising.  Had knee surgery and has not been active since that time.  Reports she stopped taking her aspirin, atorvastatin, and Zetia.  She has not been using her CPAP.  Denies any shortness of breath.  Does report some lower extremity edema.  Reports pain in back of left leg, had knee surgery in October.   Wt Readings from Last 3 Encounters:  11/30/22 288 lb (130.6 kg)  10/05/22 289 lb (131.1 kg)  09/01/22 293 lb (132.9 kg)     Past Medical History:  Diagnosis Date   Angina pectoris 10/06/2019   Anxiety    Arthritis    fingers   Cervical dysplasia    Coronary artery disease    Depression    Diabetes mellitus    Type II   Dyspnea 11/22/2013   GERD (gastroesophageal reflux disease)    High cholesterol    History of kidney stones    passed   Hypertension  no meds now   Kidney stones    Moderate episode of recurrent major depressive disorder 12/28/2019   Neuropathy    feet and legs   Nuclear sclerotic cataract of both eyes 09/2019   Dr. Shea Evansunn   PONV (postoperative nausea and vomiting)    one time   Sleep apnea    lost 70lbs no cpap x2258yrs now   Stage 3a chronic kidney disease 04/29/2020    Past Surgical History:  Procedure Laterality Date   CARDIAC CATHETERIZATION      [phone] ago   CERVICAL CONIZATION W/BX N/A 05/04/2018   Procedure: CONIZATION CERVIX WITH BIOPSY - COLD KNIFE;  Surgeon: Hermina StaggersErvin, Michael L, MD;  Location: [facility];  Service: Gynecology;  Laterality: N/A;   CHOLECYSTECTOMY     CORONARY ARTERY BYPASS GRAFT N/A 10/24/2019   Procedure: CORONARY ARTERY BYPASS GRAFTING (CABG) times four, using left radial artery harvested endoscopically and right greater saphenous vein harvested endoscopically.;  Surgeon: Kerin PernaVan Trigt, Peter, MD;  [facility];  Service: Open Heart Surgery;  Laterality: N/A;   ELBOW FRACTURE SURGERY Left    ELBOW SURGERY Right    ENDOMETRIAL ABLATION     6 years ago   INCISION AND DRAINAGE     for boil- buttocks   INCISION AND DRAINAGE Left    Leg, Nephrotoxin fasciotimy   IRRIGATION AND DEBRIDEMENT ABSCESS Right 03/17/2017   Procedure: IRRIGATION AND DEBRIDEMENT RIGHT THIGH ABSCESS;  Surgeon: Karie SodaGross, Steven, MD;  Location: [facility] ORS;  Service: General;  Laterality: Right;   LAPAROSCOPIC APPENDECTOMY  10/04/2011   Procedure: APPENDECTOMY LAPAROSCOPIC;  Surgeon: Rulon AbideBrian David Layton, DO;  Location: [facility];  Service: General;  Laterality: N/A;   LEFT HEART CATH AND CORONARY ANGIOGRAPHY N/A 10/06/2019   Procedure: LEFT HEART CATH AND CORONARY ANGIOGRAPHY;  Surgeon: SwazilandJordan, Peter M, MD;  Location: [facility];  Service: Cardiovascular;  Laterality: N/A;   RADIAL ARTERY HARVEST Left 10/24/2019   Procedure: Radial Artery Harvest;  Surgeon: Kerin PernaVan Trigt, Peter, MD;  [facility];  Service: Open Heart Surgery;  Laterality: Left;   TEE WITHOUT CARDIOVERSION N/A 10/24/2019   Procedure: TRANSESOPHAGEAL ECHOCARDIOGRAM (TEE);  Surgeon: Donata ClayVan Trigt, Theron AristaPeter, MD;  [facility];  Service: Open Heart Surgery;  Laterality: N/A;   TENDON RECONSTRUCTION Right 07/02/2021   Procedure: Right wrist 6 dorsal compartment tenosynovectomy and tendon repair Right elbow lateral epicondylar debridement and tendon repair;  Surgeon: Bradly Bienenstockrtmann, Fred,  MD;  [facility];  Service: Orthopedics;  Laterality: Right;   WRIST FRACTURE SURGERY Left    WRIST SURGERY Right     Current Medications: No outpatient medications have been marked as taking for the 12/09/22 encounter (Appointment) with Little IshikawaSchumann, Rei Medlen L, MD.     Allergies:   Sulfa antibiotics and Codeine   Social History   Socioeconomic History   Marital status: Widowed    Spouse name: Not on file   Number of children: 1   Years of education: Not on file   Highest education level: Not on file  Occupational History   Occupation: none  Tobacco Use   Smoking status: Former    Packs/day: 2.00    Years: 24.00    Additional pack years: 0.00    Total pack years: 48.00    Types: E-cigarettes, Cigarettes    Quit date: 04/22/2017    Years since quitting: 5.6    Passive exposure: Never   Smokeless tobacco: Never   Tobacco comments:    vape  Vaping  Use   Vaping Use: Former  Substance and Sexual Activity   Alcohol use: Yes    Comment: rare   Drug use: No   Sexual activity: Yes    Birth control/protection: None  Other Topics Concern   Not on file  Social History Narrative   Not on file   Social Determinants of Health   Financial Resource Strain: Not on file  Food Insecurity: Not on file  Transportation Needs: Not on file  Physical Activity: Not on file  Stress: Not on file  Social Connections: Not on file     Family History: The patient's family history includes Aneurysm in her mother; Breast cancer in her paternal grandmother; Congestive Heart Failure in her sister; Heart attack in her father; Heart disease in her sister; Osteoarthritis in her sister and sister; Pancreatic cancer in her mother; Stroke in her father.  ROS:   Please see the history of present illness.     All other systems reviewed and are negative.  EKGs/Labs/Other Studies Reviewed:    The following studies were reviewed today:   EKG:   12/03/21: NSR, rate 71, RBBB 09/01/22: Normal sinus  rhythm, rate 66, right bundle branch block   TTE 06/14/19:  1. Left ventricular ejection fraction, by visual estimation, is 60 to 65%. The left ventricle has normal function. Normal left ventricular size. There is no left ventricular hypertrophy.  2. Left ventricular diastolic Doppler parameters are consistent with impaired relaxation pattern of LV diastolic filling.  3. Global right ventricle has normal systolic function.The right ventricular size is normal. No increase in right ventricular wall thickness.  4. Left atrial size was normal.  5. Right atrial size was normal.  6. The mitral valve is normal in structure. No evidence of mitral valve regurgitation. No evidence of mitral stenosis.  7. The tricuspid valve is normal in structure. Tricuspid valve regurgitation was not visualized by color flow Doppler.  8. The aortic valve is normal in structure. Aortic valve regurgitation was not visualized by color flow Doppler. Structurally normal aortic valve, with no evidence of sclerosis or stenosis.  9. The pulmonic valve was normal in structure. Pulmonic valve regurgitation is not visualized by color flow Doppler. 10. The inferior vena cava is normal in size with greater than 50% respiratory variability, suggesting right atrial pressure of 3 mmHg.  Coronary CT 06/15/19: 1. Coronary calcium score of 81. This was 64 percentile for age and sex matched control. 2. 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 Noncalcified plaque in mid LCX appears to cause severe stenosis (70-99%) but vessel is small (<77mm) 3. Calcified plaque in the proximal LAD causes 25-49% stenosis  CTFFR 06/15/19: 1. CT-FFR across lesion in proximal LAD is 0.92, suggesting lesion is not functionally significant 2. CT-FFR across lesion in ostial D2 is 0.62, suggesting functional significance 3. CT-FFR across lesion in anomalous circumflex is 0.65, suggesting functional  significance  Cath 10/06/19: Prox Cx lesion is 60% stenosed. Mid Cx lesion is 70% stenosed. RPDA lesion is 80% stenosed. Mid LM to Ost LAD lesion is 35% stenosed. 1st Diag lesion is 85% stenosed. 2nd Diag lesion is 80% stenosed. Mid LAD lesion is 80% stenosed. LV end diastolic pressure is normal.   1. Three vessel CAD.    - 80% mid LAD after the second diagonal    - 85% slit like stenosis at the origin of the first diagonal- best seen in the LAO caudal shot.    -  80% ostial second diagonal    - Anomalous LCx off the RCA cusp with moderate diffuse disease    - 80% small PDA 2. Normal LVEDP   Plan: reviewed with interventional colleagues. Her anatomy doesn't offer good PCI options except the mid LAD. Treating the ostial disease in the diagonal branches would likely require stenting into the LAD. The LCx and PDA are too small for PCI. I would aggressively treat medically. If symptoms persist I would consider surgical consultation.      Recent Labs: 12/09/2021: Hemoglobin 12.6; Platelets 173 09/01/2022: BUN 29; Creatinine, Ser 1.34; Magnesium 2.2; Potassium 4.5; Sodium 142  Recent Lipid Panel    Component Value Date/Time   CHOL 147 07/30/2021 1213   CHOL 152 08/27/2020 1227   TRIG 128.0 07/30/2021 1213   HDL 68.70 07/30/2021 1213   HDL 76 08/27/2020 1227   CHOLHDL 2 07/30/2021 1213   VLDL 25.6 07/30/2021 1213   LDLCALC 53 07/30/2021 1213   LDLCALC 60 08/27/2020 1227    Physical Exam:    VS:  LMP  (LMP Unknown)     Wt Readings from Last 3 Encounters:  11/30/22 288 lb (130.6 kg)  10/05/22 289 lb (131.1 kg)  09/01/22 293 lb (132.9 kg)    GEN:  in no acute distress HEENT: Normal NECK: No JVD LYMPHATICS: No lymphadenopathy CARDIAC: RRR, no murmurs, rubs, gallops RESPIRATORY:  Clear to auscultation without rales, wheezing or rhonchi  ABDOMEN: Soft, non-tender, non-distended MUSCULOSKELETAL:  No edema; No deformity  SKIN: Warm and dry.   NEUROLOGIC:  Alert and oriented x  3 PSYCHIATRIC:  Normal affect   ASSESSMENT:    No diagnosis found.     PLAN:    Coronary artery disease: three-vessel CAD on cath 10/06/19: 80% mid LAD, 85% slitlike ostial D1, 80% ostial D2, 80% small PDA, anomalous circumflex off the RCA cusp with moderate diffuse disease.   Status post CABG x4 on 10/24/2019 (LIMA-LAD, radial-diagonal, SVG-diagonal, SVG-PDA).  Normal LV systolic function on echo 06/14/2019.  Stress CMR on 11/05/2020 showed no evidence of ischemia, no LGE, LVEF 61%, RVEF 60%.    She reported atypical chest pain, stress PET on 09/29/2022 showed normal perfusion, EF 62%. -***Reports she stopped taking aspirin, recommend restarting aspirin 81 mg daily -***Reports she stopped taking atorvastatin 80 mg daily and Zetia 10 mg daily, recommend restarting -Continue Toprol-XL 12.5 mg daily -As needed sublingual nitroglycerin  Lower extremity edema: No structural heart disease on echocardiogram.  No DVT on duplex 07/2020.  BNP was normal.  Has improved with PO lasix 20 mg daily, will continue.  Check BMET/magnesium. -Reported unilateral LLE pain/swelling 08/2022, lower extremity duplex showed no DVT  Hyperlipidemia: LDL 204 on 06/14/19.  Started on atorvastatin 80 mg daily and Zetia 10 mg daily.  LDL 53 on 07/30/2021.  Unfortunately she stopped taking both atorvastatin and Zetia and LDL was back up to 179 on 1/61/0960.  She was agreeable to restarting medications 08/2022***  Tobacco use: smoked 2ppd x 30 years, has quit smoking.  Started vaping but reports she has quit.  Type 2 diabetes: A1c 7.7% on 3/30//2022, down from 15.1 on 10/20/2019.  On insulin pump, Metformin, Farxiga, Ozempic.  Follows with endocrinology.  A1c 10.3% on 04/17/22.  OSA: positive sleep study 07/28/21, started CPAP.***Reports she stopped using, encouraged compliance  Morbid obesity: There is no height or weight on file to calculate BMI.  On ozempic. Referred to healthy weight and wellness   RTC  in***  Medication Adjustments/Labs and Tests Ordered: Current medicines are reviewed at length with the patient today.  Concerns regarding medicines are outlined above.  No orders of the defined types were placed in this encounter.   No orders of the defined types were placed in this encounter.    There are no Patient Instructions on file for this visit.   Signed, Little Ishikawa, MD  12/06/2022 2:21 PM    [facility]

## 2022-12-09 ENCOUNTER — Ambulatory Visit: Payer: Medicaid Other | Attending: Cardiology | Admitting: Cardiology

## 2022-12-15 ENCOUNTER — Other Ambulatory Visit: Payer: Medicaid Other | Admitting: Rheumatology

## 2023-01-12 NOTE — Progress Notes (Unsigned)
   Procedure Note  Patient: [name]             Date of Birth: 10/25/1970           MRN: [id number]             Visit Date: 01/14/2023  Procedures: Visit Diagnoses:  1. Trigger finger, right ring finger   2. Trigger finger, left middle finger   3. Primary osteoarthritis of both hands    [name] is a 52 y.o. female history of osteoarthritis.  Patient has been experiencing right ring and left middle trigger finger.  She came today to receive trigger finger injections.  Hand/UE Inj: R ring A1 for trigger finger on 01/14/2023 1:51 PM Indications: pain, tendon swelling and therapeutic Details: 27 G needle, ultrasound-guided volar approach Medications: 0.5 mL lidocaine 1 %; 20 mg triamcinolone acetonide 40 MG/ML Aspirate: 0 mL Consent was given by the patient. Immediately prior to procedure a time out was called to verify the correct patient, procedure, equipment, support staff and site/side marked as required. Patient was prepped and draped in the usual sterile fashion.    Hand/UE Inj: L long A1 for trigger finger on 01/14/2023 1:52 PM Indications: pain, tendon swelling and therapeutic Details: 27 G needle, ultrasound-guided volar approach Medications: 0.5 mL lidocaine 1 %; 20 mg triamcinolone acetonide 40 MG/ML Aspirate: 0 mL Procedure, treatment alternatives, risks and benefits explained, specific risks discussed. Consent was given by the patient. Immediately prior to procedure a time out was called to verify the correct patient, procedure, equipment, support staff and site/side marked as required. Patient was prepped and draped in the usual sterile fashion.    Patient tolerated the procedures well.  Postprocedure instructions were given.  Splints were applied to both fingers.  Pollyann Savoy, MD

## 2023-01-14 ENCOUNTER — Ambulatory Visit: Payer: Medicaid Other

## 2023-01-14 ENCOUNTER — Ambulatory Visit (INDEPENDENT_AMBULATORY_CARE_PROVIDER_SITE_OTHER): Payer: Medicaid Other

## 2023-01-14 ENCOUNTER — Ambulatory Visit: Payer: Medicaid Other | Attending: Rheumatology | Admitting: Rheumatology

## 2023-01-14 DIAGNOSIS — M19041 Primary osteoarthritis, right hand: Secondary | ICD-10-CM | POA: Diagnosis not present

## 2023-01-14 DIAGNOSIS — M65332 Trigger finger, left middle finger: Secondary | ICD-10-CM | POA: Diagnosis not present

## 2023-01-14 DIAGNOSIS — M65341 Trigger finger, right ring finger: Secondary | ICD-10-CM

## 2023-01-14 DIAGNOSIS — M19042 Primary osteoarthritis, left hand: Secondary | ICD-10-CM

## 2023-01-14 MED ORDER — TRIAMCINOLONE ACETONIDE 40 MG/ML IJ SUSP
20.0000 mg | INTRAMUSCULAR | Status: AC | PRN
  Administered 2023-01-14: 20 mg

## 2023-01-14 MED ORDER — LIDOCAINE HCL 1 % IJ SOLN
0.5000 mL | INTRAMUSCULAR | Status: AC | PRN
  Administered 2023-01-14: .5 mL

## 2023-02-23 ENCOUNTER — Other Ambulatory Visit: Payer: Self-pay | Admitting: Family Medicine

## 2023-02-23 DIAGNOSIS — Z1231 Encounter for screening mammogram for malignant neoplasm of breast: Secondary | ICD-10-CM

## 2023-02-27 NOTE — Progress Notes (Signed)
Cardiology Clinic Note   Patient Name: [name] Date of Encounter: 03/01/2023  Primary Care Provider:  [name], MD Primary Cardiologist:  Meghan Ishikawa, MD  Patient Profile    52 year old female with history of CAD, status post CABG x 4 in 2021 (LIMA to LAD, radial to diagonal, SVG to diagonal and SVG to PDA), hypertension, hyperlipidemia, and type 2 diabetes on insulin pump, OSA on CPAP, and morbid obesity.    Last seen by [name], January 2024 with complaints of left-sided chest pain.  Cardiac PET was ordered and completed on 09/29/2022 which revealed normal study with low risk.  It was also noted that she had not been taking her aspirin, atorvastatin or Zetia, which was reordered.  Past Medical History    Past Medical History:  Diagnosis Date   Angina pectoris (HCC) 10/06/2019   Anxiety    Arthritis    fingers   Cervical dysplasia    Coronary artery disease    Depression    Diabetes mellitus    Type II   Dyspnea 11/22/2013   GERD (gastroesophageal reflux disease)    High cholesterol    History of kidney stones    passed   Hypertension    no meds now   Kidney stones    Moderate episode of recurrent major depressive disorder (HCC) 12/28/2019   Neuropathy    feet and legs   Nuclear sclerotic cataract of both eyes 09/2019   Dr. Shea Deleon   PONV (postoperative nausea and vomiting)    one time   Sleep apnea    lost 70lbs no cpap x17yrs now   Stage 3a chronic kidney disease (HCC) 04/29/2020   Past Surgical History:  Procedure Laterality Date   CARDIAC CATHETERIZATION     4-20yrs ago   CERVICAL CONIZATION W/BX N/A 05/04/2018   Procedure: CONIZATION CERVIX WITH BIOPSY - COLD KNIFE;  Surgeon: Meghan Staggers, MD;  Location: [facility];  Service: Gynecology;  Laterality: N/A;   CHOLECYSTECTOMY     CORONARY ARTERY BYPASS GRAFT N/A 10/24/2019   Procedure: CORONARY ARTERY BYPASS GRAFTING (CABG) times four, using left radial artery  harvested endoscopically and right greater saphenous vein harvested endoscopically.;  Surgeon: Meghan Perna, MD;  Location: [facility] OR;  Service: Open Heart Surgery;  Laterality: N/A;   ELBOW FRACTURE SURGERY Left    ELBOW SURGERY Right    ENDOMETRIAL ABLATION     6 years ago   INCISION AND DRAINAGE     for boil- buttocks   INCISION AND DRAINAGE Left    Leg, Nephrotoxin fasciotimy   IRRIGATION AND DEBRIDEMENT ABSCESS Right 03/17/2017   Procedure: IRRIGATION AND DEBRIDEMENT RIGHT THIGH ABSCESS;  Surgeon: Meghan Soda, MD;  Location: [facility];  Service: General;  Laterality: Right;   LAPAROSCOPIC APPENDECTOMY  10/04/2011   Procedure: APPENDECTOMY LAPAROSCOPIC;  Surgeon: Meghan Abide, DO;  Location: [facility];  Service: General;  Laterality: N/A;   LEFT HEART CATH AND CORONARY ANGIOGRAPHY N/A 10/06/2019   Procedure: LEFT HEART CATH AND CORONARY ANGIOGRAPHY;  Surgeon: Swaziland, Peter M, MD;  Location: [facility];  Service: Cardiovascular;  Laterality: N/A;   RADIAL ARTERY HARVEST Left 10/24/2019   Procedure: Radial Artery Harvest;  Surgeon: Meghan Perna, MD;  Location: [facility] OR;  Service: Open Heart Surgery;  Laterality: Left;   TEE WITHOUT CARDIOVERSION N/A 10/24/2019   Procedure: TRANSESOPHAGEAL ECHOCARDIOGRAM (TEE);  Surgeon: Meghan Clay, Theron Arista, MD;  Location: [facility] OR;  Service: Open Heart  Surgery;  Laterality: N/A;   TENDON RECONSTRUCTION Right 07/02/2021   Procedure: Right wrist 6 dorsal compartment tenosynovectomy and tendon repair Right elbow lateral epicondylar debridement and tendon repair;  Surgeon: Meghan Bienenstock, MD;  Location: [facility] OR;  Service: Orthopedics;  Laterality: Right;   WRIST FRACTURE SURGERY Left    WRIST SURGERY Right     Allergies  Allergies  Allergen Reactions   Sulfa Antibiotics Hives   Codeine Itching    History of Present Illness    [name] returns to the office today for 61-month follow-up with known history of CAD and CABG, hypertension,  hyperlipidemia, with noncardiac history as listed above.  Last office visit statin therapy, Zetia, and aspirin reinstituted that she had not been taking them.  LDL was elevated at 179 at that time.  Follow-up lipid study on 02/22/2023 by PCP revealed cholesterol 338, LDL of 229.   She states that she is unable to take statins due to severe myalgia pain.  She stopped taking them about 6 months ago.  She is interested in discussing other options which may be available to her since she is unable to take these medications.  She also admits to not eating very healthy and would like to be rereferred back to [facility].  She denies chest pain dyspnea on exertion or significant fatigue.  She has had a meniscus tear on the left and has recovered well from that and has some mild ongoing swelling over the left knee only.  It is not significantly impacting her ambulation.  Home Medications    Current Outpatient Medications  Medication Sig Dispense Refill   aspirin EC 81 MG tablet Take by mouth.     cephALEXin (KEFLEX) 500 MG capsule Take 1 capsule (500 mg total) by mouth 2 (two) times daily. 20 capsule 0   Continuous Blood Gluc Sensor (DEXCOM G6 SENSOR) MISC 1 Device by Does not apply route as directed. 9 each 3   Continuous Blood Gluc Transmit (DEXCOM G6 TRANSMITTER) MISC USE AS DIRECTED 1 each 3   dapagliflozin propanediol (FARXIGA) 10 MG TABS tablet Take 1 tablet (10 mg total) by mouth daily before breakfast. 90 tablet 3   DIFLUCAN 150 MG tablet 1 tablet Orally once and again in 72 hrs if symptoms persist     docusate sodium (COLACE) 100 MG capsule Take 1 capsule (100 mg total) by mouth 2 (two) times daily. 10 capsule 0   esomeprazole (NEXIUM) 20 MG capsule Take 20 mg by mouth daily.      FLUoxetine (PROZAC) 40 MG capsule TAKE 1 CAPSULE(40 MG) BY MOUTH DAILY (Patient taking differently: Take 40 mg by mouth daily. TAKE 1 CAPSULE(40 MG) BY MOUTH DAILY) 90 capsule 2   fluticasone  (FLONASE) 50 MCG/ACT nasal spray Place 2 sprays into both nostrils daily as needed for allergies or rhinitis. 16 g 0   Insulin Disposable Pump (OMNIPOD 5 G6 POD, GEN 5,) MISC 1 Device by Does not apply route every other day. 9 each 3   insulin lispro (HUMALOG) 100 UNIT/ML injection INJECT A MAX OF 60 UNITS PER PUMP AS DIRECTED 60 mL 3   Insulin Syringes, Disposable, U-100 1 ML MISC 1 Device by Does not apply route as directed. 100 each 6   Melatonin 10 MG CAPS Take 10 mg by mouth at bedtime as needed (sleep).     metFORMIN (GLUCOPHAGE-XR) 500 MG 24 hr tablet 2 tabs daily 180 tablet 3   nitroGLYCERIN (NITROSTAT) 0.4 MG SL  tablet Place 1 tablet (0.4 mg total) under the tongue every 5 (five) minutes as needed for chest pain. 25 tablet 3   pregabalin (LYRICA) 300 MG capsule TAKE 1 CAPSULE BY MOUTH TWICE DAILY 180 capsule 3   Semaglutide, 1 MG/DOSE, 4 MG/3ML SOPN Inject 1 mg as directed once a week. 9 mL 3   atorvastatin (LIPITOR) 80 MG tablet TAKE 1 TABLET(80 MG) BY MOUTH DAILY AT 6 PM (Patient not taking: Reported on 11/30/2022) 90 tablet 0   ezetimibe (ZETIA) 10 MG tablet TAKE 1 TABLET(10 MG) BY MOUTH EVERY EVENING (Patient not taking: Reported on 03/01/2023) 90 tablet 1   furosemide (LASIX) 20 MG tablet Take 1 tablet (20 mg total) by mouth every other day. 90 tablet 3   metoprolol succinate (TOPROL-XL) 25 MG 24 hr tablet Take 0.5 tablets (12.5 mg total) by mouth daily. 90 tablet 3   No current facility-administered medications for this visit.     Family History    Family History  Problem Relation Age of Onset   Aneurysm Mother    Pancreatic cancer Mother    Heart attack Father    Stroke Father    Heart disease Sister    Breast cancer Paternal Grandmother    Congestive Heart Failure Sister    Osteoarthritis Sister    Osteoarthritis Sister    She indicated that her mother is deceased. She indicated that her father is deceased. She indicated that all of her four sisters are alive. She  indicated that the status of her paternal grandmother is unknown.  Social History    Social History   Socioeconomic History   Marital status: Widowed    Spouse name: Not on file   Number of children: 1   Years of education: Not on file   Highest education level: Not on file  Occupational History   Occupation: none  Tobacco Use   Smoking status: Some Days    Packs/day: 2.00    Years: 24.00    Additional pack years: 0.00    Total pack years: 48.00    Types: E-cigarettes, Cigarettes    Last attempt to quit: 04/22/2017    Years since quitting: 5.8    Passive exposure: Never   Smokeless tobacco: Never   Tobacco comments:    vape  Vaping Use   Vaping Use: Former  Substance and Sexual Activity   Alcohol use: Yes    Comment: rare   Drug use: No   Sexual activity: Yes    Birth control/protection: None  Other Topics Concern   Not on file  Social History Narrative   Not on file   Social Determinants of Health   Financial Resource Strain: Not on file  Food Insecurity: Not on file  Transportation Needs: Not on file  Physical Activity: Not on file  Stress: Not on file  Social Connections: Not on file  Intimate Partner Violence: Not on file     Review of Systems    General:  No chills, fever, night sweats or weight changes.  Cardiovascular:  No chest pain, dyspnea on exertion, edema, orthopnea, palpitations, paroxysmal nocturnal dyspnea. Dermatological: No rash, lesions/masses Respiratory: No cough, dyspnea Urologic: No hematuria, dysuria Abdominal:   No nausea, vomiting, diarrhea, bright red blood per rectum, melena, or hematemesis Neurologic:  No visual changes, wkns, changes in mental status. All other systems reviewed and are otherwise negative except as noted above.     Physical Exam    VS:  BP 96/74  Pulse 70   Ht 5' 5.8" (1.671 Deleon)   Wt 287 lb (130.2 kg)   LMP  (LMP Unknown)   SpO2 97%   BMI 46.61 kg/Deleon  , BMI Body mass index is 46.61 kg/Deleon.     GEN:  Well nourished, well developed, in no acute distress. HEENT: normal. Neck: Supple, no JVD, carotid bruits, or masses. Cardiac: RRR, no murmurs, rubs, or gallops. No clubbing, cyanosis, edema.  Radials/DP/PT 2+ and equal bilaterally.  Respiratory:  Respirations regular and unlabored, clear to auscultation bilaterally. GI: Soft, nontender, nondistended, BS + x 4. MS: no deformity or atrophy. Skin: warm and dry, no rash. Neuro:  Strength and sensation are intact. Psych: Normal affect.      Lab Results  Component Value Date   WBC 6.1 12/09/2021   HGB 12.6 12/09/2021   HCT 42.2 12/09/2021   MCV 79.3 (L) 12/09/2021   PLT 173 12/09/2021   Lab Results  Component Value Date   CREATININE 1.34 (H) 09/01/2022   BUN 29 (H) 09/01/2022   NA 142 09/01/2022   K 4.5 09/01/2022   CL 101 09/01/2022   CO2 25 09/01/2022   Lab Results  Component Value Date   ALT 18 12/27/2020   AST 17 12/27/2020   ALKPHOS 88 12/27/2020   BILITOT 0.4 12/27/2020   Lab Results  Component Value Date   CHOL 147 07/30/2021   HDL 68.70 07/30/2021   LDLCALC 53 07/30/2021   TRIG 128.0 07/30/2021   CHOLHDL 2 07/30/2021    Lab Results  Component Value Date   HGBA1C 10.5 (A) 10/05/2022     Review of Prior Studies Cardiac PET 09/29/2022    Normal perfusion study. Normal LVEF response to stress. No TID. MBF is abnormal but this patient has had CABG so these values are not reliable.   LV perfusion is normal. There is no evidence of ischemia. There is no evidence of infarction.   Rest left ventricular function is normal. Rest EF: 62 %. Stress left ventricular function is normal. Stress EF: 67 %. End diastolic cavity size is normal. End systolic cavity size is normal.   Myocardial blood flow was computed to be 0.5ml/g/min at rest and 1.50ml/g/min at stress. Global myocardial blood flow reserve was 1.44. Myocardial blood flow reserve is not reported in this patient due to technical or patient-specific concerns that  affect accuracy.   Coronary calcium assessment not performed due to prior revascularization.   The study is normal. The study is low risk.    Assessment & Plan   1.  Hypercholesterolemia: Statin intolerant.  Will refer her to pharmacy/lipid clinic for PCSK9 inhibition therapy recommendations.  She is willing to consider this to get her levels to goal.  She was very elevated on recent labs with a total cholesterol 338, and an LDL of 229.  I will start Zetia as she was having issues with diarrhea on GLT inhibition and wishes to be reconsidered for this as well.  Will take this step at a time.  2.  Coronary artery disease: History of coronary artery bypass grafting in 2021 by Dr. Maren Beach.  She offers no complaints of chest pain but is very concerned about elevated cholesterol and her weight as it is also impacting her cardiovascular health.  She is motivated to work harder at losing weight and keeping her self from recurrent episodes.  Will continue secondary management with blood pressure control and purposeful exercise.  She is being referred to lipid clinic as above.  3.  Morbid obesity: Is interested in being referred to [facility].  Has been on Ozempic in the past and was unable to tolerate it due to severe diarrhea.  She is to see them again for alternatives and support.  4.  Hypertension: She is hypotensive today on Lasix which she takes regularly for chronic lower extremity edema.  She is on low-dose metoprolol 12.5 mg daily.  I have advised her to decrease her Lasix to every other day to avoid hypotension.    He is given information on salty 6.  5.  Insulin-dependent diabetes: Followed by endocrinology.  Signed, Bettey Mare. Liborio Nixon, ANP, AACC   03/01/2023 12:17 PM      Office [phone] Fax [phone]  Notice: This dictation was prepared with Dragon dictation along with smaller phrase technology. Any transcriptional errors that result from this process  are unintentional and may not be corrected upon review.

## 2023-03-01 ENCOUNTER — Ambulatory Visit: Payer: Medicaid Other | Attending: Adult Health | Admitting: Adult Health

## 2023-03-01 ENCOUNTER — Encounter: Payer: Self-pay | Admitting: Adult Health

## 2023-03-01 VITALS — BP 96/74 | HR 70 | Ht 65.8 in | Wt 287.0 lb

## 2023-03-01 DIAGNOSIS — E1142 Type 2 diabetes mellitus with diabetic polyneuropathy: Secondary | ICD-10-CM

## 2023-03-01 DIAGNOSIS — E785 Hyperlipidemia, unspecified: Secondary | ICD-10-CM

## 2023-03-01 DIAGNOSIS — Z951 Presence of aortocoronary bypass graft: Secondary | ICD-10-CM

## 2023-03-01 DIAGNOSIS — Z794 Long term (current) use of insulin: Secondary | ICD-10-CM

## 2023-03-01 DIAGNOSIS — I10 Essential (primary) hypertension: Secondary | ICD-10-CM | POA: Diagnosis not present

## 2023-03-01 DIAGNOSIS — E1100 Type 2 diabetes mellitus with hyperosmolarity without nonketotic hyperglycemic-hyperosmolar coma (NKHHC): Secondary | ICD-10-CM

## 2023-03-01 DIAGNOSIS — Z6841 Body Mass Index (BMI) 40.0 and over, adult: Secondary | ICD-10-CM

## 2023-03-01 MED ORDER — METOPROLOL SUCCINATE ER 25 MG PO TB24: 12.5000 mg | ORAL_TABLET | Freq: Every day | ORAL | 3 refills | Status: DC

## 2023-03-01 MED ORDER — FUROSEMIDE 20 MG PO TABS: 20.0000 mg | ORAL_TABLET | ORAL | 3 refills | Status: DC

## 2023-03-01 NOTE — Patient Instructions (Signed)
Medication Instructions:  No Changes *If you need a refill on your cardiac medications before your next appointment, please call your pharmacy*   Lab Work: No labs If you have labs (blood work) drawn today and your tests are completely normal, you will receive your results only by: MyChart Message (if you have MyChart) OR A paper copy in the mail If you have any lab test that is abnormal or we need to change your treatment, we will call you to review the results.   Testing/Procedures: No Testing   Follow-Up: At [facility], you and your health needs are our priority.  As part of our continuing mission to provide you with exceptional heart care, we have created designated Provider Care Teams.  These Care Teams include your primary Cardiologist (physician) and Advanced Practice Providers (APPs -  Physician Assistants and Nurse Practitioners) who all work together to provide you with the care you need, when you need it.  We recommend signing up for the patient portal called "MyChart".  Sign up information is provided on this After Visit Summary.  MyChart is used to connect with patients for Virtual Visits (Telemedicine).  Patients are able to view lab/test results, encounter notes, upcoming appointments, etc.  Non-urgent messages can be sent to your provider as well.   To learn more about what you can do with MyChart, go to [email or web address].    Your next appointment:   6 month(s)  Provider:   Christopher L Schumann, MD     

## 2023-03-18 ENCOUNTER — Ambulatory Visit
Admission: RE | Admit: 2023-03-18 | Discharge: 2023-03-18 | Disposition: A | Discharge status: Home / Self Care | Payer: Medicaid Other | Source: Ambulatory Visit | Attending: Family Medicine | Admitting: Family Medicine

## 2023-03-18 DIAGNOSIS — Z1231 Encounter for screening mammogram for malignant neoplasm of breast: Secondary | ICD-10-CM

## 2023-03-22 ENCOUNTER — Other Ambulatory Visit: Payer: Self-pay

## 2023-03-22 DIAGNOSIS — Z794 Long term (current) use of insulin: Secondary | ICD-10-CM

## 2023-03-22 MED ORDER — PREGABALIN 300 MG PO CAPS: 300.0000 mg | ORAL_CAPSULE | Freq: Two times a day (BID) | ORAL | 3 refills | Status: DC

## 2023-03-24 ENCOUNTER — Telehealth: Payer: Self-pay | Admitting: Internal Medicine

## 2023-03-24 ENCOUNTER — Other Ambulatory Visit: Payer: Self-pay

## 2023-03-24 DIAGNOSIS — Z794 Long term (current) use of insulin: Secondary | ICD-10-CM

## 2023-03-24 MED ORDER — PREGABALIN 300 MG PO CAPS: 300.0000 mg | ORAL_CAPSULE | Freq: Two times a day (BID) | ORAL | 3 refills | Status: DC

## 2023-03-24 MED ORDER — PREGABALIN 300 MG PO CAPS: 300.0000 mg | ORAL_CAPSULE | Freq: Two times a day (BID) | ORAL | 5 refills | Status: DC

## 2023-03-24 NOTE — Addendum Note (Signed)
Addended by: Scarlette Shorts on: 03/24/2023 02:09 PM   Modules accepted: Orders

## 2023-03-24 NOTE — Telephone Encounter (Signed)
Patient is calling to say that the pharmacy is telling her that that they have not received a refill request from [facility].  Patient is requesting that another prescription be sent in.  pregabalin pregabalin (LYRICA) 300 MG capsule  [id number] - [place], [place] - [address] AT [facility] [address] (Ph: [phone])

## 2023-03-31 ENCOUNTER — Ambulatory Visit
Payer: Medicaid Other | Attending: Cardiovascular Disease | Admitting: Pharmacist Clinician (PhC)/ Clinical Pharmacy Specialist

## 2023-03-31 DIAGNOSIS — E785 Hyperlipidemia, unspecified: Secondary | ICD-10-CM | POA: Diagnosis not present

## 2023-03-31 NOTE — Progress Notes (Unsigned)
Office Visit    Patient Name: [name] Date of Encounter: 04/12/2023  Primary Care Provider:  [name], MD Primary Cardiologist:  Little Ishikawa, MD  Chief Complaint    Hyperlipidemia - familial  Significant Past Medical History   ASCVD 2021 CABG x 4  DM2 2/24 A1c 10.5 on dapagliflozin, Omnipod-Humalog, metformin, semaglutide  HTN Controlled, on metoprolol  Tobacco abuse         Allergies  Allergen Reactions   Sulfa Antibiotics Hives   Codeine Itching    History of Present Illness    [name] is a 52 y.o. female patient of Dr Bjorn Pippin, in the office today to discuss options for cholesterol management.    Insurance Carrier:  [employer] - [employer]  LDL Cholesterol goal:  LDL < 55  Current Medications:  ezetimibe 10 mg qd   Previously tried:  atorvastatin, rosuvastatin - myalgias  Family Hx:  father had multiple MI (first at 66, CABG x 3, stroke;  mother had cancer; 4 sisters - oldest with CABG x 1, no other CV concerns.  Social Hx: Tobacco: no- does occasionally vape (with nicotine) Alcohol: only once every 2-3 months  Diet:  mix of home and out, variety of meats, vegetables ffc; doesn't snack much; lots of fruits   Exercise: going to start walking, bought step counter; staring to use stationary bike, walking further day by day  Accessory Clinical Findings   In [facility]:  01/2023:  TC 338, TG 227, HDL 74, LDL 220  Lab Results  Component Value Date   CHOL 147 07/30/2021   HDL 68.70 07/30/2021   LDLCALC 53 07/30/2021   TRIG 128.0 07/30/2021   CHOLHDL 2 07/30/2021    No results found for: "LIPOA"  Lab Results  Component Value Date   ALT 18 12/27/2020   AST 17 12/27/2020   ALKPHOS 88 12/27/2020   BILITOT 0.4 12/27/2020   Lab Results  Component Value Date   CREATININE 1.34 (H) 09/01/2022   BUN 29 (H) 09/01/2022   NA 142 09/01/2022   K 4.5 09/01/2022   CL 101 09/01/2022   CO2 25 09/01/2022   Lab Results  Component  Value Date   HGBA1C 10.5 (A) 10/05/2022    Home Medications    Current Outpatient Medications  Medication Sig Dispense Refill   aspirin EC 81 MG tablet Take by mouth.     atorvastatin (LIPITOR) 80 MG tablet TAKE 1 TABLET(80 MG) BY MOUTH DAILY AT 6 PM (Patient not taking: Reported on 11/30/2022) 90 tablet 0   cephALEXin (KEFLEX) 500 MG capsule Take 1 capsule (500 mg total) by mouth 2 (two) times daily. 20 capsule 0   Continuous Blood Gluc Sensor (DEXCOM G6 SENSOR) MISC 1 Device by Does not apply route as directed. 9 each 3   Continuous Blood Gluc Transmit (DEXCOM G6 TRANSMITTER) MISC USE AS DIRECTED 1 each 3   dapagliflozin propanediol (FARXIGA) 10 MG TABS tablet Take 1 tablet (10 mg total) by mouth daily before breakfast. 90 tablet 3   DIFLUCAN 150 MG tablet 1 tablet Orally once and again in 72 hrs if symptoms persist     docusate sodium (COLACE) 100 MG capsule Take 1 capsule (100 mg total) by mouth 2 (two) times daily. 10 capsule 0   esomeprazole (NEXIUM) 20 MG capsule Take 20 mg by mouth daily.      ezetimibe (ZETIA) 10 MG tablet TAKE 1 TABLET(10 MG) BY MOUTH EVERY EVENING (Patient not taking: Reported  on 03/01/2023) 90 tablet 1   FLUoxetine (PROZAC) 40 MG capsule TAKE 1 CAPSULE(40 MG) BY MOUTH DAILY (Patient taking differently: Take 40 mg by mouth daily. TAKE 1 CAPSULE(40 MG) BY MOUTH DAILY) 90 capsule 2   fluticasone (FLONASE) 50 MCG/ACT nasal spray Place 2 sprays into both nostrils daily as needed for allergies or rhinitis. 16 g 0   furosemide (LASIX) 20 MG tablet Take 1 tablet (20 mg total) by mouth every other day. 90 tablet 3   Insulin Disposable Pump (OMNIPOD 5 G6 POD, GEN 5,) MISC 1 Device by Does not apply route every other day. 9 each 3   insulin lispro (HUMALOG) 100 UNIT/ML injection INJECT A MAX OF 60 UNITS PER PUMP AS DIRECTED 60 mL 3   Insulin Syringes, Disposable, U-100 1 ML MISC 1 Device by Does not apply route as directed. 100 each 6   Melatonin 10 MG CAPS Take 10 mg by mouth  at bedtime as needed (sleep).     metFORMIN (GLUCOPHAGE-XR) 500 MG 24 hr tablet 2 tabs daily 180 tablet 3   metoprolol succinate (TOPROL-XL) 25 MG 24 hr tablet Take 0.5 tablets (12.5 mg total) by mouth daily. 90 tablet 3   nitroGLYCERIN (NITROSTAT) 0.4 MG SL tablet Place 1 tablet (0.4 mg total) under the tongue every 5 (five) minutes as needed for chest pain. 25 tablet 3   pregabalin (LYRICA) 300 MG capsule Take 1 capsule (300 mg total) by mouth 2 (two) times daily. 60 capsule 5   Semaglutide, 1 MG/DOSE, 4 MG/3ML SOPN Inject 1 mg as directed once a week. 9 mL 3   No current facility-administered medications for this visit.     Assessment & Plan    Dyslipidemia Assessment: Patient with ASCVD and familial hyperlipidemia not at LDL goal of < 55 Most recent LDL 220 on 01/2023 Has been compliant with ezetimibe 10 mg Not able to tolerate statins secondary to myalgias Reviewed options for lowering LDL cholesterol, including PCSK-9 inhibitors, bempedoic acid and inclisiran.  Discussed mechanisms of action, dosing, side effects, potential decreases in LDL cholesterol and costs.  Also reviewed potential options for patient assistance.  Plan: Patient agreeable to starting Repatha 140 mg q14d Repeat labs after:  2 months Lipid Liver function    Phillips Hay, PharmD CPP [facility] [address]  [place], [place] [address] [phone]  04/12/2023, 6:23 AM

## 2023-03-31 NOTE — Patient Instructions (Signed)
Your Results:             Your most recent labs Goal  Total Cholesterol 338 < 200  Triglycerides 227 < 150  HDL (happy/good cholesterol) 74 > 40  LDL (lousy/bad cholesterol 220 < 55   Medication changes:  We will start the process to get Repatha covered by your insurance.  Once the prior authorization is complete, I will call/send a MyChart message to let you know and confirm pharmacy information.   You will take one injection every 14 days  Lab orders:  We want to repeat labs after 2-3 months.  We will send you a lab order to remind you once we get closer to that time.     Thank you for choosing [facility]

## 2023-04-02 ENCOUNTER — Other Ambulatory Visit (HOSPITAL_COMMUNITY): Payer: Self-pay

## 2023-04-05 ENCOUNTER — Ambulatory Visit: Payer: Medicaid Other | Admitting: Internal Medicine

## 2023-04-12 ENCOUNTER — Encounter: Payer: Self-pay | Admitting: Pharmacist Clinician (PhC)/ Clinical Pharmacy Specialist

## 2023-04-12 ENCOUNTER — Other Ambulatory Visit (HOSPITAL_COMMUNITY): Payer: Self-pay

## 2023-04-12 ENCOUNTER — Telehealth: Payer: Self-pay | Admitting: Pharmacist Clinician (PhC)/ Clinical Pharmacy Specialist

## 2023-04-12 ENCOUNTER — Telehealth: Payer: Self-pay

## 2023-04-12 NOTE — Telephone Encounter (Signed)
Please do PA for Repatha 140 mg - familial and ASCVD

## 2023-04-12 NOTE — Assessment & Plan Note (Signed)
Assessment: Patient with ASCVD and familial hyperlipidemia not at LDL goal of < 55 Most recent LDL 220 on 01/2023 Has been compliant with ezetimibe 10 mg Not able to tolerate statins secondary to myalgias Reviewed options for lowering LDL cholesterol, including PCSK-9 inhibitors, bempedoic acid and inclisiran.  Discussed mechanisms of action, dosing, side effects, potential decreases in LDL cholesterol and costs.  Also reviewed potential options for patient assistance.  Plan: Patient agreeable to starting Repatha 140 mg q14d Repeat labs after:  2 months Lipid Liver function

## 2023-04-12 NOTE — Telephone Encounter (Signed)
Pharmacy Patient Advocate Encounter   Received notification from Physician's Office/[email or web address] that prior authorization for REPATHA is required/requested.   Insurance verification completed.   The patient is insured through [facility] .   Per test claim: PA required; PA submitted to [facility] via CoverMyMeds Key/confirmation #/EOC [id number]   Status is pending

## 2023-04-14 ENCOUNTER — Encounter (INDEPENDENT_AMBULATORY_CARE_PROVIDER_SITE_OTHER): Payer: Medicaid Other | Admitting: Family Medicine

## 2023-04-15 ENCOUNTER — Encounter: Payer: Self-pay | Admitting: Pharmacist Clinician (PhC)/ Clinical Pharmacy Specialist

## 2023-04-15 NOTE — Telephone Encounter (Signed)
An additional info/appeal letter has been sent to the patient plan with the ''requested information needed''

## 2023-04-16 ENCOUNTER — Other Ambulatory Visit (HOSPITAL_COMMUNITY): Payer: Self-pay

## 2023-04-16 NOTE — Telephone Encounter (Signed)
Pharmacy Patient Advocate Encounter  Received notification from [facility] that Prior Authorization for Repatha SureClick 140MG /ML auto-injectors has been APPROVED from 04/15/23 to 04/13/24. Ran test claim, Copay is $4.00. This test claim was processed through [facility]- copay amounts may vary at other pharmacies due to pharmacy/plan contracts, or as the patient moves through the different stages of their insurance plan.   PA #/Case ID/Reference #:  [id number]

## 2023-04-19 MED ORDER — REPATHA SURECLICK 140 MG/ML ~~LOC~~ SOAJ: 140.0000 mg | SUBCUTANEOUS | 3 refills | Status: DC

## 2023-04-19 NOTE — Addendum Note (Signed)
Addended by: Rosalee Kaufman on: 04/19/2023 04:54 PM   Modules accepted: Orders

## 2023-04-28 ENCOUNTER — Telehealth: Payer: Self-pay

## 2023-04-28 ENCOUNTER — Other Ambulatory Visit (HOSPITAL_COMMUNITY): Payer: Self-pay

## 2023-04-28 ENCOUNTER — Encounter: Payer: Self-pay | Admitting: Internal Medicine

## 2023-04-28 NOTE — Telephone Encounter (Signed)
Pharmacy Patient Advocate Encounter   Received notification from Pt Calls Messages that prior authorization for Dexcom G6 sensor is required/requested.   Insurance verification completed.   The patient is insured through [facility] .   Per test claim: PA required; PA submitted to [facility] via CoverMyMeds Key/confirmation #/EOC [id number] Status is pending

## 2023-04-28 NOTE — Telephone Encounter (Signed)
Dexcom supplies need PA.  

## 2023-04-29 ENCOUNTER — Telehealth: Payer: Self-pay | Admitting: Internal Medicine

## 2023-04-29 NOTE — Telephone Encounter (Signed)
Patients sisterLeighton Ruff ) picked up Dexcom G6 for patient

## 2023-05-04 NOTE — Telephone Encounter (Signed)
Pharmacy Patient Advocate Encounter  Received notification from [facility] that Prior Authorization for Dexcom G6 sensor has been DENIED.  Full denial letter will be uploaded to the media tab. See denial reason below.    It looks like they are saying they have not seen an improvement in glycemic control to be able to continue to cover  PA #/Case ID/Reference #: [id number]

## 2023-05-12 ENCOUNTER — Telehealth: Payer: Self-pay

## 2023-05-12 MED ORDER — ACCU-CHEK GUIDE W/DEVICE KIT: PACK | 0 refills | Status: AC

## 2023-05-12 MED ORDER — ACCU-CHEK GUIDE VI STRP: ORAL_STRIP | 12 refills | Status: AC

## 2023-05-12 MED ORDER — ACCU-CHEK FASTCLIX LANCETS MISC: 2 refills | Status: AC

## 2023-05-12 NOTE — Telephone Encounter (Signed)
Patient aware and meter and supplies have been sent to [facility] [address]

## 2023-05-12 NOTE — Telephone Encounter (Signed)
Insurance denied the PA for the Dexcom and patient is wanting to know what she needs to do at this point regarding the Omnipod, since it works with [employer].   Denial reason:   It looks like they are saying they have not seen an improvement in glycemic control to be able to continue to cover

## 2023-05-24 NOTE — Progress Notes (Deleted)
Office Visit Note  Patient: [name]             Date of Birth: October 13, 1970           MRN: [id number]             PCP: [name], MD Referring: [name], * Visit Date: 06/01/2023 Occupation: @GUAROCC @  Subjective:  No chief complaint on file.   History of Present Illness: [name] is a 52 y.o. female ***     Activities of Daily Living:  Patient reports morning stiffness for *** {minute/hour:19697}.   Patient {ACTIONS;DENIES/REPORTS:21021675::"Denies"} nocturnal pain.  Difficulty dressing/grooming: {ACTIONS;DENIES/REPORTS:21021675::"Denies"} Difficulty climbing stairs: {ACTIONS;DENIES/REPORTS:21021675::"Denies"} Difficulty getting out of chair: {ACTIONS;DENIES/REPORTS:21021675::"Denies"} Difficulty using hands for taps, buttons, cutlery, and/or writing: {ACTIONS;DENIES/REPORTS:21021675::"Denies"}  No Rheumatology ROS completed.   PMFS History:  Patient Active Problem List   Diagnosis Date Noted   Class 3 severe obesity with serious comorbidity and body mass index (BMI) of 40.0 to 44.9 in adult [facility]) 10/06/2022   Polyneuropathy associated with underlying disease (HCC) 07/30/2021   Dehydration 12/27/2020   Type 2 diabetes mellitus with hyperglycemia, with long-term current use of insulin (HCC) 11/11/2020   Stage 3a chronic kidney disease (HCC) 04/29/2020   COVID-19 virus vaccination declined 04/29/2020   Influenza vaccination declined 04/29/2020   Midline sternotomy scar 04/17/2020   Left rib fracture 03/13/2020   Moderate episode of recurrent major depressive disorder (HCC) 12/28/2019   Encounter for post surgical wound check 12/06/2019   Type 2 diabetes mellitus with diabetic polyneuropathy, with long-term current use of insulin (HCC) 11/16/2019   Diabetes mellitus (HCC) 11/16/2019   Type 2 diabetes mellitus with retinopathy, with long-term current use of insulin (HCC) 11/16/2019   Dyslipidemia 11/16/2019   Postop check 11/15/2019    Diabetic retinopathy (HCC) 10/25/2019   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   Angina pectoris (HCC) 10/06/2019   Left-sided chest pain 06/13/2019   Post-operative state 06/06/2018   HGSIL (high grade squamous intraepithelial lesion) on Pap smear of cervix 02/25/2018   Right upper quadrant abdominal pain 12/30/2017   Polyarthritis 12/30/2017   OSA (obstructive sleep apnea) 11/22/2013   Dyspnea 11/22/2013   Uncontrolled type 2 diabetes mellitus with hyperosmolar nonketotic hyperglycemia (HCC) 07/21/2012   HTN (hypertension) 07/21/2012   Smoker 07/21/2012    Past Medical History:  Diagnosis Date   Angina pectoris (HCC) 10/06/2019   Anxiety    Arthritis    fingers   Cervical dysplasia    Coronary artery disease    Depression    Diabetes mellitus    Type II   Dyspnea 11/22/2013   GERD (gastroesophageal reflux disease)    High cholesterol    History of kidney stones    passed   Hypertension    no meds now   Kidney stones    Moderate episode of recurrent major depressive disorder (HCC) 12/28/2019   Neuropathy    feet and legs   Nuclear sclerotic cataract of both eyes 09/2019   Dr. Shea Evans   PONV (postoperative nausea and vomiting)    one time   Sleep apnea    lost 70lbs no cpap x26yrs now   Stage 3a chronic kidney disease (HCC) 04/29/2020    Family History  Problem Relation Age of Onset   Aneurysm Mother    Pancreatic cancer Mother    Heart attack Father    Stroke Father    Heart disease Sister    Breast cancer Paternal Grandmother  Congestive Heart Failure Sister    Osteoarthritis Sister    Osteoarthritis Sister    Past Surgical History:  Procedure Laterality Date   CARDIAC CATHETERIZATION     4-51yrs ago   CERVICAL CONIZATION W/BX N/A 05/04/2018   Procedure: CONIZATION CERVIX WITH BIOPSY - COLD KNIFE;  Surgeon: Hermina Staggers, MD;  Location: [facility];  Service: Gynecology;  Laterality: N/A;   CHOLECYSTECTOMY     CORONARY ARTERY BYPASS GRAFT N/A  10/24/2019   Procedure: CORONARY ARTERY BYPASS GRAFTING (CABG) times four, using left radial artery harvested endoscopically and right greater saphenous vein harvested endoscopically.;  Surgeon: Kerin Perna, MD;  Location: [facility] OR;  Service: Open Heart Surgery;  Laterality: N/A;   ELBOW FRACTURE SURGERY Left    ELBOW SURGERY Right    ENDOMETRIAL ABLATION     6 years ago   INCISION AND DRAINAGE     for boil- buttocks   INCISION AND DRAINAGE Left    Leg, Nephrotoxin fasciotimy   IRRIGATION AND DEBRIDEMENT ABSCESS Right 03/17/2017   Procedure: IRRIGATION AND DEBRIDEMENT RIGHT THIGH ABSCESS;  Surgeon: Karie Soda, MD;  Location: [facility];  Service: General;  Laterality: Right;   LAPAROSCOPIC APPENDECTOMY  10/04/2011   Procedure: APPENDECTOMY LAPAROSCOPIC;  Surgeon: Rulon Abide, DO;  Location: [facility];  Service: General;  Laterality: N/A;   LEFT HEART CATH AND CORONARY ANGIOGRAPHY N/A 10/06/2019   Procedure: LEFT HEART CATH AND CORONARY ANGIOGRAPHY;  Surgeon: Swaziland, Peter M, MD;  [facility];  Service: Cardiovascular;  Laterality: N/A;   RADIAL ARTERY HARVEST Left 10/24/2019   Procedure: Radial Artery Harvest;  Surgeon: Kerin Perna, MD;  Location: [facility] OR;  Service: Open Heart Surgery;  Laterality: Left;   TEE WITHOUT CARDIOVERSION N/A 10/24/2019   Procedure: TRANSESOPHAGEAL ECHOCARDIOGRAM (TEE);  Surgeon: Donata Clay, Theron Arista, MD;  Location: [facility] OR;  Service: Open Heart Surgery;  Laterality: N/A;   TENDON RECONSTRUCTION Right 07/02/2021   Procedure: Right wrist 6 dorsal compartment tenosynovectomy and tendon repair Right elbow lateral epicondylar debridement and tendon repair;  Surgeon: Bradly Bienenstock, MD;  Location: [facility] OR;  Service: Orthopedics;  Laterality: Right;   WRIST FRACTURE SURGERY Left    WRIST SURGERY Right    Social History   Social History Narrative   Not on file   Immunization History  Administered Date(s) Administered   Influenza Split 05/31/2013    Influenza,inj,Quad PF,6+ Mos 06/05/2019   Pneumococcal Polysaccharide-23 03/29/2017   Tdap 01/27/2018     Objective: Vital Signs: LMP  (LMP Unknown)    Physical Exam   Musculoskeletal Exam: ***  CDAI Exam: CDAI Score: -- Patient Global: --; Provider Global: -- Swollen: --; Tender: -- Joint Exam 06/01/2023   No joint exam has been documented for this visit   There is currently no information documented on the homunculus. Go to the Rheumatology activity and complete the homunculus joint exam.  Investigation: No additional findings.  Imaging: No results found.  Recent Labs: Lab Results  Component Value Date   WBC 6.1 12/09/2021   HGB 12.6 12/09/2021   PLT 173 12/09/2021   NA 142 09/01/2022   K 4.5 09/01/2022   CL 101 09/01/2022   CO2 25 09/01/2022   GLUCOSE 75 09/01/2022   BUN 29 (H) 09/01/2022   CREATININE 1.34 (H) 09/01/2022   BILITOT 0.4 12/27/2020   ALKPHOS 88 12/27/2020   AST 17 12/27/2020   ALT 18 12/27/2020   PROT 6.4 (L) 12/27/2020   ALBUMIN  3.8 12/27/2020   CALCIUM 9.7 09/01/2022   GFRAA 51 (L) 10/18/2020    Speciality Comments: No specialty comments available.  Procedures:  No procedures performed Allergies: Sulfa antibiotics and Codeine   Assessment / Plan:     Visit Diagnoses: No diagnosis found.  Orders: No orders of the defined types were placed in this encounter.  No orders of the defined types were placed in this encounter.   Face-to-face time spent with patient was *** minutes. Greater than 50% of time was spent in counseling and coordination of care.  Follow-Up Instructions: No follow-ups on file.   Ellen Henri, CMA  Note - This record has been created using Animal nutritionist.  Chart creation errors have been sought, but may not always  have been located. Such creation errors do not reflect on  the standard of medical care.

## 2023-06-01 ENCOUNTER — Ambulatory Visit: Payer: Medicaid Other | Admitting: Rheumatology

## 2023-06-01 DIAGNOSIS — Z87891 Personal history of nicotine dependence: Secondary | ICD-10-CM

## 2023-06-01 DIAGNOSIS — R87613 High grade squamous intraepithelial lesion on cytologic smear of cervix (HGSIL): Secondary | ICD-10-CM

## 2023-06-01 DIAGNOSIS — G8929 Other chronic pain: Secondary | ICD-10-CM

## 2023-06-01 DIAGNOSIS — M7061 Trochanteric bursitis, right hip: Secondary | ICD-10-CM

## 2023-06-01 DIAGNOSIS — H2513 Age-related nuclear cataract, bilateral: Secondary | ICD-10-CM

## 2023-06-01 DIAGNOSIS — M7631 Iliotibial band syndrome, right leg: Secondary | ICD-10-CM

## 2023-06-01 DIAGNOSIS — M19041 Primary osteoarthritis, right hand: Secondary | ICD-10-CM

## 2023-06-01 DIAGNOSIS — F32A Depression, unspecified: Secondary | ICD-10-CM

## 2023-06-01 DIAGNOSIS — M19071 Primary osteoarthritis, right ankle and foot: Secondary | ICD-10-CM

## 2023-06-01 DIAGNOSIS — Z8719 Personal history of other diseases of the digestive system: Secondary | ICD-10-CM

## 2023-06-01 DIAGNOSIS — R296 Repeated falls: Secondary | ICD-10-CM

## 2023-06-01 DIAGNOSIS — G4733 Obstructive sleep apnea (adult) (pediatric): Secondary | ICD-10-CM

## 2023-06-01 DIAGNOSIS — I10 Essential (primary) hypertension: Secondary | ICD-10-CM

## 2023-06-01 DIAGNOSIS — E785 Hyperlipidemia, unspecified: Secondary | ICD-10-CM

## 2023-06-01 DIAGNOSIS — Z951 Presence of aortocoronary bypass graft: Secondary | ICD-10-CM

## 2023-06-01 DIAGNOSIS — L0100 Impetigo, unspecified: Secondary | ICD-10-CM

## 2023-06-01 DIAGNOSIS — Z87442 Personal history of urinary calculi: Secondary | ICD-10-CM

## 2023-06-01 DIAGNOSIS — M65332 Trigger finger, left middle finger: Secondary | ICD-10-CM

## 2023-06-01 DIAGNOSIS — E1100 Type 2 diabetes mellitus with hyperosmolarity without nonketotic hyperglycemic-hyperosmolar coma (NKHHC): Secondary | ICD-10-CM

## 2023-06-01 DIAGNOSIS — M65341 Trigger finger, right ring finger: Secondary | ICD-10-CM

## 2023-06-18 ENCOUNTER — Ambulatory Visit: Payer: Medicaid Other | Admitting: Internal Medicine

## 2023-06-18 NOTE — Progress Notes (Deleted)
Name: [name]  Age/ Sex: 52 y.o., female   MRN/ DOB: [id number], 22-Dec-1970     PCP: [name], MD   Reason for Endocrinology Evaluation: Type 2 Diabetes Mellitus  Initial Endocrine Consultative Visit: 11/16/2019    PATIENT IDENTIFIER: [name] is a 52 y.o. female with a past medical history of CAD, T2DM and HTN. The patient has followed with Endocrinology clinic since 11/16/2019 for consultative assistance with management of her diabetes.  DIABETIC HISTORY:  [name] was diagnosed with DM in 2004. She is intolerant to higher doses of metformin, nor to Trulicity/ Victoza  due to diarrhea. She was able to tolerate Victoza which was started 05/2019. Her hemoglobin A1c has ranged from 7.2% in 2013, peaking at >15.0% in 2019.    On her initial visit to our clinic she had an A1c of 15.1% . She was on Levemir, Metformin  And victoza and we added [place].   Victoza was stopped 05/2020 due to "severe nausea"  She was started on the Omnipod 07/2020   Victoza stopped 05/2020 due to nausea and diarrhea  Metformin reduced by 50% due to GI issues     Switched Gabapentin to Lyrica 10/2020 SUBJECTIVE:   During the last visit (10/05/2022): A1c 10.5%.     Today (06/18/2023): [name] is here for a follow up on diabetes management.  She typically uses dexcom for glucose checks but has not used it in a couple of weeks due to    She has been following up with Ortho for left knee pain She had a follow-up with cardiology for CAD Continues with Lyrica  Denies nausea, vomiting or diarrhea   She has an upcoming appointment with [facility] this week   Follows with nephrology and rheumatology   This patient with type 2 diabetes is treated with Omnipod (insulin pump).     Pump and meter download:   Pump   Omnipod Settings   Insulin type   Novolog     Basal rate       0000-0600 2.35 u/h    0600-0000 2.10          I:C ratio        0000 1:1 enter #5 TIDWAC                  Sensitivity       0000  20      Goal       0000  120       Type & Model of Pump: Omnipod Insulin Type: Currently using Novolog    PUMP STATISTICS:  Average BG: 267  Average Daily Carbs (g):  Average Total Daily Insulin: 42.2 Average Daily Basal: 38.1 (90 %) Average Daily Bolus: 4.1 (10 %)     HOME DIABETES REGIMEN:  Metformin 500 mg XR , 2 tablet with breakfast  Farxiga 10 mg daily  Ozempic 0.5 mg weekly     Statin: Yes ACE-I/ARB: no     CONTINUOUS GLUCOSE MONITORING RECORD INTERPRETATION  :n/a        DIABETIC COMPLICATIONS: Microvascular complications:  Neuropathy, retinopathy Denies: CKD Last Eye Exam: Completed   Macrovascular complications:  CAD ( S/P CABG 10/2019) Denies:  CVA, PVD   HISTORY:  Past Medical History:  Past Medical History:  Diagnosis Date   Angina pectoris (HCC) 10/06/2019   Anxiety    Arthritis    fingers   Cervical dysplasia    Coronary artery  disease    Depression    Diabetes mellitus    Type II   Dyspnea 11/22/2013   GERD (gastroesophageal reflux disease)    High cholesterol    History of kidney stones    passed   Hypertension    no meds now   Kidney stones    Moderate episode of recurrent major depressive disorder (HCC) 12/28/2019   Neuropathy    feet and legs   Nuclear sclerotic cataract of both eyes 09/2019   Dr. Shea Evans   PONV (postoperative nausea and vomiting)    one time   Sleep apnea    lost 70lbs no cpap x33yrs now   Stage 3a chronic kidney disease (HCC) 04/29/2020   Past Surgical History:  Past Surgical History:  Procedure Laterality Date   CARDIAC CATHETERIZATION     4-38yrs ago   CERVICAL CONIZATION W/BX N/A 05/04/2018   Procedure: CONIZATION CERVIX WITH BIOPSY - COLD KNIFE;  Surgeon: Hermina Staggers, MD;  Location: [facility];  Service: Gynecology;  Laterality: N/A;   CHOLECYSTECTOMY     CORONARY ARTERY BYPASS GRAFT N/A  10/24/2019   Procedure: CORONARY ARTERY BYPASS GRAFTING (CABG) times four, using left radial artery harvested endoscopically and right greater saphenous vein harvested endoscopically.;  Surgeon: Kerin Perna, MD;  Location: [facility] OR;  Service: Open Heart Surgery;  Laterality: N/A;   ELBOW FRACTURE SURGERY Left    ELBOW SURGERY Right    ENDOMETRIAL ABLATION     6 years ago   INCISION AND DRAINAGE     for boil- buttocks   INCISION AND DRAINAGE Left    Leg, Nephrotoxin fasciotimy   IRRIGATION AND DEBRIDEMENT ABSCESS Right 03/17/2017   Procedure: IRRIGATION AND DEBRIDEMENT RIGHT THIGH ABSCESS;  Surgeon: Karie Soda, MD;  Location: [facility];  Service: General;  Laterality: Right;   LAPAROSCOPIC APPENDECTOMY  10/04/2011   Procedure: APPENDECTOMY LAPAROSCOPIC;  Surgeon: Rulon Abide, DO;  Location: [facility];  Service: General;  Laterality: N/A;   LEFT HEART CATH AND CORONARY ANGIOGRAPHY N/A 10/06/2019   Procedure: LEFT HEART CATH AND CORONARY ANGIOGRAPHY;  Surgeon: Swaziland, Peter M, MD;  [facility];  Service: Cardiovascular;  Laterality: N/A;   RADIAL ARTERY HARVEST Left 10/24/2019   Procedure: Radial Artery Harvest;  Surgeon: Kerin Perna, MD;  Location: [facility] OR;  Service: Open Heart Surgery;  Laterality: Left;   TEE WITHOUT CARDIOVERSION N/A 10/24/2019   Procedure: TRANSESOPHAGEAL ECHOCARDIOGRAM (TEE);  Surgeon: Donata Clay, Theron Arista, MD;  Location: [facility] OR;  Service: Open Heart Surgery;  Laterality: N/A;   TENDON RECONSTRUCTION Right 07/02/2021   Procedure: Right wrist 6 dorsal compartment tenosynovectomy and tendon repair Right elbow lateral epicondylar debridement and tendon repair;  Surgeon: Bradly Bienenstock, MD;  Location: [facility] OR;  Service: Orthopedics;  Laterality: Right;   WRIST FRACTURE SURGERY Left    WRIST SURGERY Right    Social History:  reports that she has been smoking e-cigarettes and cigarettes. She started smoking about 30 years ago. She has a 48 pack-year smoking  history. She has never been exposed to tobacco smoke. She has never used smokeless tobacco. She reports current alcohol use. She reports that she does not use drugs. Family History:  Family History  Problem Relation Age of Onset   Aneurysm Mother    Pancreatic cancer Mother    Heart attack Father    Stroke Father    Heart disease Sister    Breast cancer Paternal Grandmother    Congestive  Heart Failure Sister    Osteoarthritis Sister    Osteoarthritis Sister      HOME MEDICATIONS: Allergies as of 06/18/2023       Reactions   Sulfa Antibiotics Hives   Codeine Itching        Medication List        Accurate as of June 18, 2023  7:02 AM. If you have any questions, ask your nurse or doctor.          Accu-Chek FastClix Lancets Misc Check blood sugar 4x daily  DX. E11.65   Accu-Chek Guide test strip Generic drug: glucose blood Check blood sugar 4x daily DX E11.65   Accu-Chek Guide w/Device Kit Check blood sugar 4x daily   aspirin EC 81 MG tablet Take by mouth.   atorvastatin 80 MG tablet Commonly known as: LIPITOR TAKE 1 TABLET(80 MG) BY MOUTH DAILY AT 6 PM   cephALEXin 500 MG capsule Commonly known as: Keflex Take 1 capsule (500 mg total) by mouth 2 (two) times daily.   dapagliflozin propanediol 10 MG Tabs tablet Commonly known as: Farxiga Take 1 tablet (10 mg total) by mouth daily before breakfast.   Dexcom G6 Sensor Misc 1 Device by Does not apply route as directed.   Dexcom G6 Transmitter Misc USE AS DIRECTED   Diflucan 150 MG tablet Generic drug: fluconazole 1 tablet Orally once and again in 72 hrs if symptoms persist   docusate sodium 100 MG capsule Commonly known as: Colace Take 1 capsule (100 mg total) by mouth 2 (two) times daily.   esomeprazole 20 MG capsule Commonly known as: NEXIUM Take 20 mg by mouth daily.   ezetimibe 10 MG tablet Commonly known as: ZETIA TAKE 1 TABLET(10 MG) BY MOUTH EVERY EVENING   FLUoxetine 40 MG  capsule Commonly known as: PROZAC TAKE 1 CAPSULE(40 MG) BY MOUTH DAILY What changed:  how much to take how to take this when to take this   fluticasone 50 MCG/ACT nasal spray Commonly known as: FLONASE Place 2 sprays into both nostrils daily as needed for allergies or rhinitis.   furosemide 20 MG tablet Commonly known as: LASIX Take 1 tablet (20 mg total) by mouth every other day.   insulin lispro 100 UNIT/ML injection Commonly known as: HumaLOG INJECT A MAX OF 60 UNITS PER PUMP AS DIRECTED   Insulin Syringes (Disposable) U-100 1 ML Misc 1 Device by Does not apply route as directed.   Melatonin 10 MG Caps Take 10 mg by mouth at bedtime as needed (sleep).   metFORMIN 500 MG 24 hr tablet Commonly known as: GLUCOPHAGE-XR 2 tabs daily   metoprolol succinate 25 MG 24 hr tablet Commonly known as: TOPROL-XL Take 0.5 tablets (12.5 mg total) by mouth daily.   nitroGLYCERIN 0.4 MG SL tablet Commonly known as: NITROSTAT Place 1 tablet (0.4 mg total) under the tongue every 5 (five) minutes as needed for chest pain.   Omnipod 5 G6 Pods (Gen 5) Misc 1 Device by Does not apply route every other day.   pregabalin 300 MG capsule Commonly known as: Lyrica Take 1 capsule (300 mg total) by mouth 2 (two) times daily.   Repatha SureClick 140 MG/ML Soaj Generic drug: Evolocumab Inject 140 mg into the skin every 14 (fourteen) days.   Semaglutide (1 MG/DOSE) 4 MG/3ML Sopn Inject 1 mg as directed once a week.         OBJECTIVE:   Vital Signs: LMP  (LMP Unknown)     Wt Readings  from Last 3 Encounters:  03/01/23 287 lb (130.2 kg)  11/30/22 288 lb (130.6 kg)  10/05/22 289 lb (131.1 kg)     Exam: General: [name] appears well and is in NAD  Lungs: Clear with good BS bilat with no rales, rhonchi, or wheezes  Heart: RRR   Extremities: No pretibial edema  Neuro: MS is good with appropriate affect, [name] is alert and Ox3      DM foot exam: 04/17/2022   The skin of the feet is  intact without sores or ulcerations. The pedal pulses are undetectable on today's exam  The sensation is normal to a screening 5.07, 10 gram monofilament bilaterally   DATA REVIEWED:  Lab Results  Component Value Date   HGBA1C 10.5 (A) 10/05/2022   HGBA1C 10.3 (A) 04/17/2022   HGBA1C 7.7 (A) 11/27/2021     Latest Reference Range & Units 09/01/22 10:50  Sodium 134 - 144 mmol/L 142  Potassium 3.5 - 5.2 mmol/L 4.5  Chloride 96 - 106 mmol/L 101  CO2 20 - 29 mmol/L 25  Glucose 70 - 99 mg/dL 75  BUN 6 - 24 mg/dL 29 (H)  Creatinine 1.61 - 1.00 mg/dL 0.96 (H)  Calcium 8.7 - 10.2 mg/dL 9.7  BUN/Creatinine Ratio 9 - 23  22  eGFR >59 mL/min/1.73 47 (L)  Magnesium 1.6 - 2.3 mg/dL 2.2  (H): Data is abnormally high (L): Data is abnormally low   ASSESSMENT / PLAN / RECOMMENDATIONS:   1) Type 2 Diabetes Mellitus, Poorly   controlled, With Neuropathic, retinopathic , CKD III and macrovascular complications - Most recent A1c of 10.5 %. Goal A1c < 7.0 %.     -[name] with worsening glycemic control due to variable reasons , mainly due to lack of use of dexcom and not bolusing with meal  - She has intolerance to Victoza and Trulicity but doing well with Ozempic  - Since she does not count CHO, I have asked her to enter #5 rams with each meal from now on  - Will increase Ozempic  - Will change I:C ratio and decrease basal rate   MEDICATIONS: - Continue  Ozempic 0.5 mg weekly - Continue Metformin 500 mg XR , 2 tablet with breakfast  - Continue Farxiga  10mg  , 1 tablet daily with Breakfast     Pump   Omnipod Settings   Insulin type   Novolog     Basal rate       0000-0600 2.35 u/h    0600-0000 2.10          I:C ratio       0000 1:1 enter #5 TIDWAC                  Sensitivity       0000  20      Goal       0000  120     EDUCATION / INSTRUCTIONS: BG monitoring instructions: Patient is instructed to check her blood sugars 4 times a day, preprandial and bedtime Call [place]  Endocrinology clinic if: BG persistently < 70  I reviewed the Rule of 15 for the treatment of hypoglycemia in detail with the patient. Literature supplied.     2) Diabetic complications:  Eye: Does  have known diabetic retinopathy.  Neuro/ Feet: Does have known diabetic peripheral neuropathy .  Renal: Patient does not have known baseline CKD. She   is not on an ACEI/ARB at present.   3) Peripheral neuropathy   -Tolerating  Lyrica Medication Continue Lyrica 300 twice daily  4) Dyslipidemia :   - Lipid panel at goal  Continue Atorvastatin 80 mg daily    F/U in 6 months    Signed electronically by: Lyndle Herrlich, MD  [facility]  [facility] [address] [place], [place] [address] Phone: [phone] FAX: [phone]   CC: [name], MD [address] [place] [address] Phone: [phone]  Fax: [phone]  Return to Endocrinology clinic as below: Future Appointments  Date Time Provider Department Center  06/18/2023  9:10 AM Atonya Templer, Konrad Dolores, MD [id number] None  07/05/2023  2:10 PM Gearldine Bienenstock, PA-C [facility] None

## 2023-06-21 NOTE — Progress Notes (Signed)
Office Visit Note  Patient: [name]             Date of Birth: 1970/12/17           MRN: [id number]             PCP: [name], MD Referring: [name], * Visit Date: 07/05/2023 Occupation: @GUAROCC @  Subjective:  Right trochanteric bursitis  History of Present Illness: [name] is a 52 y.o. female with history of osteoarthritis.  Patient experiences intermittent discomfort in both hands and both knee joints.  She notices intermittent inflammation in the left knee which she attributes to a meniscal tear.  Patient experiences intermittent discomfort on the lateral aspect of the right hip consistent with trochanteric bursitis.  She has been performing stretching exercises on a regular basis.  Her symptoms are exacerbated when lying on her right side at night. Patient underwent an ultrasound-guided right ring and left middle trigger finger injection on 01/14/2023 which resolved the locking and tenderness she was experiencing. Patient states that her total body pain has improved since switching from a statin to Repatha about 1-1/2 months ago.   Activities of Daily Living:  Patient reports morning stiffness for 30 minutes.   Patient Reports nocturnal pain.  Difficulty dressing/grooming: Denies Difficulty climbing stairs: Reports Difficulty getting out of chair: Reports Difficulty using hands for taps, buttons, cutlery, and/or writing: Reports  Review of Systems  Constitutional:  Positive for fatigue.  HENT:  Positive for mouth dryness. Negative for mouth sores.   Eyes:  Positive for visual disturbance. Negative for dryness.  Respiratory:  Negative for cough, shortness of breath and wheezing.   Cardiovascular:  Positive for chest pain and swelling in legs/feet. Negative for palpitations.  Gastrointestinal:  Negative for blood in stool, constipation and diarrhea.  Endocrine: Negative for increased urination.  Genitourinary:  Negative for  involuntary urination.  Musculoskeletal:  Positive for joint pain, gait problem, joint pain, joint swelling, myalgias, muscle weakness, morning stiffness, muscle tenderness and myalgias.  Skin:  Positive for rash, hair loss and sensitivity to sunlight. Negative for color change.  Allergic/Immunologic: Positive for susceptible to infections.  Neurological:  Positive for headaches. Negative for dizziness, light-headedness and numbness.  Hematological:  Negative for swollen glands.  Psychiatric/Behavioral:  Positive for sleep disturbance. Negative for depressed mood. The patient is not nervous/anxious.     PMFS History:  Patient Active Problem List   Diagnosis Date Noted   Class 3 severe obesity with serious comorbidity and body mass index (BMI) of 40.0 to 44.9 in adult [facility]) 10/06/2022   Polyneuropathy associated with underlying disease (HCC) 07/30/2021   Dehydration 12/27/2020   Type 2 diabetes mellitus with hyperglycemia, with long-term current use of insulin (HCC) 11/11/2020   Stage 3a chronic kidney disease (HCC) 04/29/2020   Severe acute respiratory syndrome coronavirus 2 (SARS-CoV-2) vaccination declined 04/29/2020   Influenza vaccination declined 04/29/2020   Midline sternotomy scar 04/17/2020   Left rib fracture 03/13/2020   Moderate episode of recurrent major depressive disorder (HCC) 12/28/2019   Encounter for post surgical wound check 12/06/2019   Type 2 diabetes mellitus with diabetic polyneuropathy, with long-term current use of insulin (HCC) 11/16/2019   Diabetes mellitus (HCC) 11/16/2019   Type 2 diabetes mellitus with retinopathy, with long-term current use of insulin (HCC) 11/16/2019   Dyslipidemia 11/16/2019   Postop check 11/15/2019   Diabetic retinopathy (HCC) 10/25/2019   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   Angina pectoris (HCC) 10/06/2019  Left-sided chest pain 06/13/2019   Post-operative state 06/06/2018   HGSIL (high grade squamous intraepithelial lesion) on Pap smear of  cervix 02/25/2018   Right upper quadrant abdominal pain 12/30/2017   Polyarthritis 12/30/2017   OSA (obstructive sleep apnea) 11/22/2013   Dyspnea 11/22/2013   Uncontrolled type 2 diabetes mellitus with hyperosmolar nonketotic hyperglycemia (HCC) 07/21/2012   HTN (hypertension) 07/21/2012   Smoker 07/21/2012    Past Medical History:  Diagnosis Date   Angina pectoris (HCC) 10/06/2019   Anxiety    Arthritis    fingers   Cervical dysplasia    Coronary artery disease    Depression    Diabetes mellitus    Type II   Dyspnea 11/22/2013   GERD (gastroesophageal reflux disease)    High cholesterol    History of kidney stones    passed   Hypertension    no meds now   Kidney stones    Moderate episode of recurrent major depressive disorder (HCC) 12/28/2019   Neuropathy    feet and legs   Nuclear sclerotic cataract of both eyes 09/2019   Dr. Shea Evans   PONV (postoperative nausea and vomiting)    one time   Sleep apnea    lost 70lbs no cpap x40yrs now   Stage 3a chronic kidney disease (HCC) 04/29/2020    Family History  Problem Relation Age of Onset   Aneurysm Mother    Pancreatic cancer Mother    Heart attack Father    Stroke Father    Heart disease Sister    Breast cancer Paternal Grandmother    Congestive Heart Failure Sister    Osteoarthritis Sister    Osteoarthritis Sister    Past Surgical History:  Procedure Laterality Date   CARDIAC CATHETERIZATION     4-83yrs ago   CERVICAL CONIZATION W/BX N/A 05/04/2018   Procedure: CONIZATION CERVIX WITH BIOPSY - COLD KNIFE;  Surgeon: Hermina Staggers, MD;  Location: [facility];  Service: Gynecology;  Laterality: N/A;   CHOLECYSTECTOMY     CORONARY ARTERY BYPASS GRAFT N/A 10/24/2019   Procedure: CORONARY ARTERY BYPASS GRAFTING (CABG) times four, using left radial artery harvested endoscopically and right greater saphenous vein harvested endoscopically.;  Surgeon: Kerin Perna, MD;  Location: [facility];  Service: Open  Heart Surgery;  Laterality: N/A;   ELBOW FRACTURE SURGERY Left    ELBOW SURGERY Right    ENDOMETRIAL ABLATION     6 years ago   INCISION AND DRAINAGE     for boil- buttocks   INCISION AND DRAINAGE Left    Leg, Nephrotoxin fasciotimy   IRRIGATION AND DEBRIDEMENT ABSCESS Right 03/17/2017   Procedure: IRRIGATION AND DEBRIDEMENT RIGHT THIGH ABSCESS;  Surgeon: Karie Soda, MD;  Location: [facility];  Service: General;  Laterality: Right;   LAPAROSCOPIC APPENDECTOMY  10/04/2011   Procedure: APPENDECTOMY LAPAROSCOPIC;  Surgeon: Rulon Abide, DO;  Location: [facility];  Service: General;  Laterality: N/A;   LEFT HEART CATH AND CORONARY ANGIOGRAPHY N/A 10/06/2019   Procedure: LEFT HEART CATH AND CORONARY ANGIOGRAPHY;  Surgeon: Swaziland, Peter M, MD;  Location: [facility];  Service: Cardiovascular;  Laterality: N/A;   RADIAL ARTERY HARVEST Left 10/24/2019   Procedure: Radial Artery Harvest;  Surgeon: Kerin Perna, MD;  Location: [facility] OR;  Service: Open Heart Surgery;  Laterality: Left;   TEE WITHOUT CARDIOVERSION N/A 10/24/2019   Procedure: TRANSESOPHAGEAL ECHOCARDIOGRAM (TEE);  Surgeon: Donata Clay, Theron Arista, MD;  Location: [facility] OR;  Service: Open Heart  Surgery;  Laterality: N/A;   TENDON RECONSTRUCTION Right 07/02/2021   Procedure: Right wrist 6 dorsal compartment tenosynovectomy and tendon repair Right elbow lateral epicondylar debridement and tendon repair;  Surgeon: Bradly Bienenstock, MD;  Location: [facility] OR;  Service: Orthopedics;  Laterality: Right;   WRIST FRACTURE SURGERY Left    WRIST SURGERY Right    Social History   Social History Narrative   Not on file   Immunization History  Administered Date(s) Administered   Influenza Split 05/31/2013   Influenza,inj,Quad PF,6+ Mos 06/05/2019   Pneumococcal Polysaccharide-23 03/29/2017   Tdap 01/27/2018     Objective: Vital Signs: BP 111/77 (BP Location: Left Arm, Patient Position: Sitting, Cuff Size: Normal)   Pulse 61   Ht 5' 8.5" (1.74 m)    Wt 291 lb 3.2 oz (132.1 kg)   LMP  (LMP Unknown)   BMI 43.63 kg/m    Physical Exam Vitals and nursing note reviewed.  Constitutional:      Appearance: She is well-developed.  HENT:     Head: Normocephalic and atraumatic.  Eyes:     Conjunctiva/sclera: Conjunctivae normal.  Cardiovascular:     Rate and Rhythm: Normal rate and regular rhythm.     Heart sounds: Normal heart sounds.  Pulmonary:     Effort: Pulmonary effort is normal.     Breath sounds: Normal breath sounds.  Abdominal:     General: Bowel sounds are normal.     Palpations: Abdomen is soft.  Musculoskeletal:     Cervical back: Normal range of motion.  Lymphadenopathy:     Cervical: No cervical adenopathy.  Skin:    General: Skin is warm and dry.     Capillary Refill: Capillary refill takes less than 2 seconds.  Neurological:     Mental Status: She is alert and oriented to person, place, and time.  Psychiatric:        Behavior: Behavior normal.      Musculoskeletal Exam: C-spine has good range of motion with no discomfort.  Shoulder joints, elbow joints, wrist joints, MCPs, PIPs, DIPs have good range of motion with no synovitis.  PIP and DIP thickening consistent with osteoarthritis of both hands.  Hip joints have good range of motion no groin pain.  Tenderness over the right trochanteric bursitis.  Knee joints have good range of motion no warmth or effusion.  Ankle joints have good range of motion with no tenderness or joint swelling.  CDAI Exam: CDAI Score: -- Patient Global: --; Provider Global: -- Swollen: --; Tender: -- Joint Exam 07/05/2023   No joint exam has been documented for this visit   There is currently no information documented on the homunculus. Go to the Rheumatology activity and complete the homunculus joint exam.  Investigation: No additional findings.  Imaging: No results found.  Recent Labs: Lab Results  Component Value Date   WBC 6.1 12/09/2021   HGB 12.6 12/09/2021   PLT  173 12/09/2021   NA 142 09/01/2022   K 4.5 09/01/2022   CL 101 09/01/2022   CO2 25 09/01/2022   GLUCOSE 75 09/01/2022   BUN 29 (H) 09/01/2022   CREATININE 1.34 (H) 09/01/2022   BILITOT 0.4 12/27/2020   ALKPHOS 88 12/27/2020   AST 17 12/27/2020   ALT 18 12/27/2020   PROT 6.4 (L) 12/27/2020   ALBUMIN 3.8 12/27/2020   CALCIUM 9.7 09/01/2022   GFRAA 51 (L) 10/18/2020    Speciality Comments: No specialty comments available.  Procedures:  No procedures performed Allergies:  Sulfa antibiotics and Codeine   Assessment / Plan:     Visit Diagnoses: Primary osteoarthritis of both hands: Patient has PIP and DIP thickening consistent with osteoarthritis of both hands.  No synovitis noted on examination today.  Complete fist formation noted bilaterally.  Patient was advised to notify us if she develops increased joint pain or joint swelling.  Follow-up in 1 year or sooner if needed.  Trigger finger, right ring finger:Resolved-cortisone injection performed on 01/14/2023.   Trigger finger, left middle finger: Resolved-cortisone injection performed on 01/14/2023.  Trochanteric bursitis of both hips: Patient has tenderness palpation over the right trochanteric bursa.  Her symptoms are exacerbated by lying on her sides at night.  She is been performing stretching exercises on a regular basis.  She was advised to notify us if her symptoms persist or worsen.  It band syndrome, right - MRI-Significant proximal IT band muscle strain at the iliac fascia lata muscle at the iliac crest.  Under care of Dr. Aundria Rud.  Joints is intermittent discomfort over the right trochanteric bursa and along the IT band.  She has been performing stretching exercises on a regular basis.  Her symptoms are exacerbated by lying on her sides at night.  Chronic pain of left knee - meniscus tear repair October 2023 by Dr. Aundria Rud.  Patient experiences intermittent swelling in the left knee.  No effusion noted today.  Primary  osteoarthritis of both feet: She is not experiencing any increased discomfort in her feet at this time.  She has good range of motion of both ankle joints with no joint tenderness or swelling.  Frequent falls - DEXA updated on 02/16/22: The BMD measured at DualFemur Neck Right is 0.982 g/cm2 with a T-score of -0.4.  Other medical conditions are listed as follows:  Essential hypertension: Blood pressure was 111/77 today in the office.  History of kidney stones  Uncontrolled type 2 diabetes mellitus with hyperosmolar nonketotic hyperglycemia (HCC)  History of gastroesophageal reflux (GERD)  S/P CABG x 4: She has been experiencing some left-sided chest pain for the past several days.  She has not had any shortness of breath or diaphoresis.  She has a prescription for nitroglycerin but has not taken it recently.  Encouraged patient to go to urgent care today for further evaluation.  Dyslipidemia: Patient has been switched from statin therapy to Repatha which has helped with the total body pain she was experiencing.  Nuclear sclerotic cataract of both eyes  HGSIL (high grade squamous intraepithelial lesion) on Pap smear of cervix  Anxiety and depression  OSA (obstructive sleep apnea)  Former smoker  Orders: No orders of the defined types were placed in this encounter.  No orders of the defined types were placed in this encounter.    Follow-Up Instructions: Return in about 1 year (around 07/04/2024) for Osteoarthritis.   Gearldine Bienenstock, PA-C  Note - This record has been created using Dragon software.  Chart creation errors have been sought, but may not always  have been located. Such creation errors do not reflect on  the standard of medical care.

## 2023-06-23 ENCOUNTER — Ambulatory Visit: Payer: Medicaid Other | Admitting: Internal Medicine

## 2023-06-23 NOTE — Progress Notes (Deleted)
Name: [name]  Age/ Sex: 52 y.o., female   MRN/ DOB: [id number], 1970-10-26     PCP: [name], MD   Reason for Endocrinology Evaluation: Type 2 Diabetes Mellitus  Initial Endocrine Consultative Visit: 11/16/2019    PATIENT IDENTIFIER: [name] is a 52 y.o. female with a past medical history of CAD, T2DM and HTN. The patient has followed with Endocrinology clinic since 11/16/2019 for consultative assistance with management of her diabetes.  DIABETIC HISTORY:  [name] was diagnosed with DM in 2004. She is intolerant to higher doses of metformin, nor to Trulicity/ Victoza  due to diarrhea. She was able to tolerate Victoza which was started 05/2019. Her hemoglobin A1c has ranged from 7.2% in 2013, peaking at >15.0% in 2019.    On her initial visit to our clinic she had an A1c of 15.1% . She was on Levemir, Metformin  And victoza and we added [place].   Victoza was stopped 05/2020 due to "severe nausea"  She was started on the Omnipod 07/2020   Victoza stopped 05/2020 due to nausea and diarrhea  Metformin reduced by 50% due to GI issues     Switched Gabapentin to Lyrica 10/2020 SUBJECTIVE:   During the last visit (10/05/2022): A1c 10.5%.     Today (06/23/2023): [name] is here for a follow up on diabetes management.  She typically uses dexcom for glucose checks but has not used it in a couple of weeks due to    She has been following up with Ortho for left knee pain She had a follow-up with cardiology for CAD Continues with Lyrica  Denies nausea, vomiting or diarrhea   She has an upcoming appointment with [facility] this week   Follows with nephrology and rheumatology   This patient with type 2 diabetes is treated with Omnipod (insulin pump).     Pump and meter download:   Pump   Omnipod Settings   Insulin type   Novolog     Basal rate       0000-0600 2.35 u/h    0600-0000 2.10          I:C ratio        0000 1:1 enter #5 TIDWAC                  Sensitivity       0000  20      Goal       0000  120       Type & Model of Pump: Omnipod Insulin Type: Currently using Novolog    PUMP STATISTICS:  Average BG: 267  Average Daily Carbs (g):  Average Total Daily Insulin: 42.2 Average Daily Basal: 38.1 (90 %) Average Daily Bolus: 4.1 (10 %)     HOME DIABETES REGIMEN:  Metformin 500 mg XR , 2 tablet with breakfast  Farxiga 10 mg daily  Ozempic 0.5 mg weekly     Statin: Yes ACE-I/ARB: no     CONTINUOUS GLUCOSE MONITORING RECORD INTERPRETATION  :n/a        DIABETIC COMPLICATIONS: Microvascular complications:  Neuropathy, retinopathy Denies: CKD Last Eye Exam: Completed   Macrovascular complications:  CAD ( S/P CABG 10/2019) Denies:  CVA, PVD   HISTORY:  Past Medical History:  Past Medical History:  Diagnosis Date   Angina pectoris (HCC) 10/06/2019   Anxiety    Arthritis    fingers   Cervical dysplasia    Coronary artery  disease    Depression    Diabetes mellitus    Type II   Dyspnea 11/22/2013   GERD (gastroesophageal reflux disease)    High cholesterol    History of kidney stones    passed   Hypertension    no meds now   Kidney stones    Moderate episode of recurrent major depressive disorder (HCC) 12/28/2019   Neuropathy    feet and legs   Nuclear sclerotic cataract of both eyes 09/2019   Dr. Shea Evans   PONV (postoperative nausea and vomiting)    one time   Sleep apnea    lost 70lbs no cpap x51yrs now   Stage 3a chronic kidney disease (HCC) 04/29/2020   Past Surgical History:  Past Surgical History:  Procedure Laterality Date   CARDIAC CATHETERIZATION     4-67yrs ago   CERVICAL CONIZATION W/BX N/A 05/04/2018   Procedure: CONIZATION CERVIX WITH BIOPSY - COLD KNIFE;  Surgeon: Hermina Staggers, MD;  Location: [facility];  Service: Gynecology;  Laterality: N/A;   CHOLECYSTECTOMY     CORONARY ARTERY BYPASS GRAFT N/A  10/24/2019   Procedure: CORONARY ARTERY BYPASS GRAFTING (CABG) times four, using left radial artery harvested endoscopically and right greater saphenous vein harvested endoscopically.;  Surgeon: Kerin Perna, MD;  Location: [facility] OR;  Service: Open Heart Surgery;  Laterality: N/A;   ELBOW FRACTURE SURGERY Left    ELBOW SURGERY Right    ENDOMETRIAL ABLATION     6 years ago   INCISION AND DRAINAGE     for boil- buttocks   INCISION AND DRAINAGE Left    Leg, Nephrotoxin fasciotimy   IRRIGATION AND DEBRIDEMENT ABSCESS Right 03/17/2017   Procedure: IRRIGATION AND DEBRIDEMENT RIGHT THIGH ABSCESS;  Surgeon: Karie Soda, MD;  Location: [facility];  Service: General;  Laterality: Right;   LAPAROSCOPIC APPENDECTOMY  10/04/2011   Procedure: APPENDECTOMY LAPAROSCOPIC;  Surgeon: Rulon Abide, DO;  Location: [facility];  Service: General;  Laterality: N/A;   LEFT HEART CATH AND CORONARY ANGIOGRAPHY N/A 10/06/2019   Procedure: LEFT HEART CATH AND CORONARY ANGIOGRAPHY;  Surgeon: [place], Peter M, MD;  Location: [facility];  Service: Cardiovascular;  Laterality: N/A;   RADIAL ARTERY HARVEST Left 10/24/2019   Procedure: Radial Artery Harvest;  Surgeon: Kerin Perna, MD;  Location: [facility] OR;  Service: Open Heart Surgery;  Laterality: Left;   TEE WITHOUT CARDIOVERSION N/A 10/24/2019   Procedure: TRANSESOPHAGEAL ECHOCARDIOGRAM (TEE);  Surgeon: Donata Clay, Theron Arista, MD;  Location: [facility] OR;  Service: Open Heart Surgery;  Laterality: N/A;   TENDON RECONSTRUCTION Right 07/02/2021   Procedure: Right wrist 6 dorsal compartment tenosynovectomy and tendon repair Right elbow lateral epicondylar debridement and tendon repair;  Surgeon: Bradly Bienenstock, MD;  Location: [facility] OR;  Service: Orthopedics;  Laterality: Right;   WRIST FRACTURE SURGERY Left    WRIST SURGERY Right    Social History:  reports that she has been smoking e-cigarettes and cigarettes. She started smoking about 30 years ago. She has a 48 pack-year smoking  history. She has never been exposed to tobacco smoke. She has never used smokeless tobacco. She reports current alcohol use. She reports that she does not use drugs. Family History:  Family History  Problem Relation Age of Onset   Aneurysm Mother    Pancreatic cancer Mother    Heart attack Father    Stroke Father    Heart disease Sister    Breast cancer Paternal Grandmother    Congestive  Heart Failure Sister    Osteoarthritis Sister    Osteoarthritis Sister      HOME MEDICATIONS: Allergies as of 06/23/2023       Reactions   Sulfa Antibiotics Hives   Codeine Itching        Medication List        Accurate as of June 23, 2023 10:02 AM. If you have any questions, ask your nurse or doctor.          Accu-Chek FastClix Lancets Misc Check blood sugar 4x daily  DX. E11.65   Accu-Chek Guide test strip Generic drug: glucose blood Check blood sugar 4x daily DX E11.65   Accu-Chek Guide w/Device Kit Check blood sugar 4x daily   aspirin EC 81 MG tablet Take by mouth.   atorvastatin 80 MG tablet Commonly known as: LIPITOR TAKE 1 TABLET(80 MG) BY MOUTH DAILY AT 6 PM   cephALEXin 500 MG capsule Commonly known as: Keflex Take 1 capsule (500 mg total) by mouth 2 (two) times daily.   dapagliflozin propanediol 10 MG Tabs tablet Commonly known as: Farxiga Take 1 tablet (10 mg total) by mouth daily before breakfast.   Dexcom G6 Sensor Misc 1 Device by Does not apply route as directed.   Dexcom G6 Transmitter Misc USE AS DIRECTED   Diflucan 150 MG tablet Generic drug: fluconazole 1 tablet Orally once and again in 72 hrs if symptoms persist   docusate sodium 100 MG capsule Commonly known as: Colace Take 1 capsule (100 mg total) by mouth 2 (two) times daily.   esomeprazole 20 MG capsule Commonly known as: NEXIUM Take 20 mg by mouth daily.   ezetimibe 10 MG tablet Commonly known as: ZETIA TAKE 1 TABLET(10 MG) BY MOUTH EVERY EVENING   FLUoxetine 40 MG  capsule Commonly known as: PROZAC TAKE 1 CAPSULE(40 MG) BY MOUTH DAILY What changed:  how much to take how to take this when to take this   fluticasone 50 MCG/ACT nasal spray Commonly known as: FLONASE Place 2 sprays into both nostrils daily as needed for allergies or rhinitis.   furosemide 20 MG tablet Commonly known as: LASIX Take 1 tablet (20 mg total) by mouth every other day.   insulin lispro 100 UNIT/ML injection Commonly known as: HumaLOG INJECT A MAX OF 60 UNITS PER PUMP AS DIRECTED   Insulin Syringes (Disposable) U-100 1 ML Misc 1 Device by Does not apply route as directed.   Melatonin 10 MG Caps Take 10 mg by mouth at bedtime as needed (sleep).   metFORMIN 500 MG 24 hr tablet Commonly known as: GLUCOPHAGE-XR 2 tabs daily   metoprolol succinate 25 MG 24 hr tablet Commonly known as: TOPROL-XL Take 0.5 tablets (12.5 mg total) by mouth daily.   nitroGLYCERIN 0.4 MG SL tablet Commonly known as: NITROSTAT Place 1 tablet (0.4 mg total) under the tongue every 5 (five) minutes as needed for chest pain.   Omnipod 5 DexG7G6 Pods Gen 5 Misc 1 Device by Does not apply route every other day.   pregabalin 300 MG capsule Commonly known as: Lyrica Take 1 capsule (300 mg total) by mouth 2 (two) times daily.   Repatha SureClick 140 MG/ML Soaj Generic drug: Evolocumab Inject 140 mg into the skin every 14 (fourteen) days.   Semaglutide (1 MG/DOSE) 4 MG/3ML Sopn Inject 1 mg as directed once a week.         OBJECTIVE:   Vital Signs: LMP  (LMP Unknown)     Wt Readings from  Last 3 Encounters:  03/01/23 287 lb (130.2 kg)  11/30/22 288 lb (130.6 kg)  10/05/22 289 lb (131.1 kg)     Exam: General: Pt appears well and is in NAD  Lungs: Clear with good BS bilat with no rales, rhonchi, or wheezes  Heart: RRR   Extremities: No pretibial edema  Neuro: MS is good with appropriate affect, pt is alert and Ox3      DM foot exam: 04/17/2022   The skin of the feet is  intact without sores or ulcerations. The pedal pulses are undetectable on today's exam  The sensation is normal to a screening 5.07, 10 gram monofilament bilaterally   DATA REVIEWED:  Lab Results  Component Value Date   HGBA1C 10.5 (A) 10/05/2022   HGBA1C 10.3 (A) 04/17/2022   HGBA1C 7.7 (A) 11/27/2021     Latest Reference Range & Units 09/01/22 10:50  Sodium 134 - 144 mmol/L 142  Potassium 3.5 - 5.2 mmol/L 4.5  Chloride 96 - 106 mmol/L 101  CO2 20 - 29 mmol/L 25  Glucose 70 - 99 mg/dL 75  BUN 6 - 24 mg/dL 29 (H)  Creatinine 1.61 - 1.00 mg/dL 0.96 (H)  Calcium 8.7 - 10.2 mg/dL 9.7  BUN/Creatinine Ratio 9 - 23  22  eGFR >59 mL/min/1.73 47 (L)  Magnesium 1.6 - 2.3 mg/dL 2.2  (H): Data is abnormally high (L): Data is abnormally low   ASSESSMENT / PLAN / RECOMMENDATIONS:   1) Type 2 Diabetes Mellitus, Poorly   controlled, With Neuropathic, retinopathic , CKD III and macrovascular complications - Most recent A1c of 10.5 %. Goal A1c < 7.0 %.     -Pt with worsening glycemic control due to variable reasons , mainly due to lack of use of dexcom and not bolusing with meal  - She has intolerance to Victoza and Trulicity but doing well with Ozempic  - Since she does not count CHO, I have asked her to enter #5 rams with each meal from now on  - Will increase Ozempic  - Will change I:C ratio and decrease basal rate   MEDICATIONS: - Continue  Ozempic 0.5 mg weekly - Continue Metformin 500 mg XR , 2 tablet with breakfast  - Continue Farxiga  10mg  , 1 tablet daily with Breakfast     Pump   Omnipod Settings   Insulin type   Novolog     Basal rate       0000-0600 2.35 u/h    0600-0000 2.10          I:C ratio       0000 1:1 enter #5 TIDWAC                  Sensitivity       0000  20      Goal       0000  120     EDUCATION / INSTRUCTIONS: BG monitoring instructions: Patient is instructed to check her blood sugars 4 times a day, preprandial and bedtime [facility] clinic if: BG persistently < 70  I reviewed the Rule of 15 for the treatment of hypoglycemia in detail with the patient. Literature supplied.     2) Diabetic complications:  Eye: Does  have known diabetic retinopathy.  Neuro/ Feet: Does have known diabetic peripheral neuropathy .  Renal: Patient does not have known baseline CKD. She   is not on an ACEI/ARB at present.   3) Peripheral neuropathy   -Tolerating Lyrica  Medication Continue Lyrica 300 twice daily  4) Dyslipidemia :   - Lipid panel at goal  Continue Atorvastatin 80 mg daily    F/U in 6 months    Signed electronically by: Lyndle Herrlich, MD  [facility]  [facility] [address] [place] [place], [place] [address] Phone: [phone] FAX: [phone]   CC: [name], MD [address] [place] [address] Phone: [phone]  Fax: [phone]  Return to Endocrinology clinic as below: Future Appointments  Date Time Provider Department Center  06/23/2023  2:40 PM Lennon Richins, Konrad Dolores, MD [id number] None  07/05/2023  2:10 PM Gearldine Bienenstock, PA-C [facility] None

## 2023-06-30 ENCOUNTER — Encounter: Payer: Self-pay | Admitting: Internal Medicine

## 2023-06-30 ENCOUNTER — Ambulatory Visit: Payer: Medicaid Other | Admitting: Internal Medicine

## 2023-06-30 VITALS — BP 114/76 | HR 73 | Ht 65.8 in | Wt 286.0 lb

## 2023-06-30 DIAGNOSIS — Z794 Long term (current) use of insulin: Secondary | ICD-10-CM | POA: Diagnosis not present

## 2023-06-30 DIAGNOSIS — E1159 Type 2 diabetes mellitus with other circulatory complications: Secondary | ICD-10-CM

## 2023-06-30 DIAGNOSIS — G63 Polyneuropathy in diseases classified elsewhere: Secondary | ICD-10-CM

## 2023-06-30 DIAGNOSIS — E1165 Type 2 diabetes mellitus with hyperglycemia: Secondary | ICD-10-CM | POA: Diagnosis not present

## 2023-06-30 DIAGNOSIS — E1142 Type 2 diabetes mellitus with diabetic polyneuropathy: Secondary | ICD-10-CM

## 2023-06-30 DIAGNOSIS — E785 Hyperlipidemia, unspecified: Secondary | ICD-10-CM

## 2023-06-30 LAB — POCT GLYCOSYLATED HEMOGLOBIN (HGB A1C): Hemoglobin A1C: 15 % — AB (ref 4.0–5.6)

## 2023-06-30 MED ORDER — INSULIN LISPRO 100 UNIT/ML IJ SOLN: INTRAMUSCULAR | 3 refills | Status: AC

## 2023-06-30 MED ORDER — DEXCOM G7 SENSOR MISC: 1.0000 | 3 refills | Status: DC

## 2023-06-30 MED ORDER — OMNIPOD 5 G7 PODS (GEN 5) MISC: 1.0000 | 3 refills | Status: AC

## 2023-06-30 MED ORDER — SEMAGLUTIDE(0.25 OR 0.5MG/DOS) 2 MG/3ML ~~LOC~~ SOPN: 0.5000 mg | PEN_INJECTOR | SUBCUTANEOUS | 3 refills | Status: AC

## 2023-06-30 NOTE — Progress Notes (Signed)
Name: [name]  Age/ Sex: 52 y.o., female   MRN/ DOB: [id number], June 10, 1971     PCP: [name], MD   Reason for Endocrinology Evaluation: Type 2 Diabetes Mellitus  Initial Endocrine Consultative Visit: 11/16/2019    PATIENT IDENTIFIER: [name] is a 52 y.o. female with a past medical history of CAD, T2DM and HTN. The patient has followed with Endocrinology clinic since 11/16/2019 for consultative assistance with management of her diabetes.  DIABETIC HISTORY:  [name] was diagnosed with DM in 2004. She is intolerant to higher doses of metformin, nor to Trulicity/ Victoza  due to diarrhea. She was able to tolerate Victoza which was started 05/2019. Her hemoglobin A1c has ranged from 7.2% in 2013, peaking at >15.0% in 2019.    On her initial visit to our clinic she had an A1c of 15.1% . She was on Levemir, Metformin  And victoza and we added [place].   Victoza was stopped 05/2020 due to "severe nausea"  She was started on the Omnipod 07/2020   Victoza stopped 05/2020 due to nausea and diarrhea  Metformin reduced by 50% due to GI issues     Switched Gabapentin to Lyrica 10/2020 SUBJECTIVE:   During the last visit (10/05/2022): A1c 10.5%.     Today (06/30/2023): [name] is here for a follow up on diabetes management.  She typically uses dexcom for glucose checks but has not been able to get her Dexcom due to insurance denial   She had a follow-up with cardiology for CAD Continues with Lyrica  Denies nausea, vomiting  Has noted constipation  Denies dizziness  Stopped Ozempic due to diarrhea with 1 mg     Follows with nephrology and rheumatology   This patient with type 2 diabetes is treated with Omnipod (insulin pump).   Pump and meter download:   Pump   Omnipod Settings   Insulin type   Novolog     Basal rate       0000 2.35 u/h    0600 2.10          I:C ratio       0000 1:1 enter #5 TIDWAC                  Sensitivity        0000  20      Goal       0000  120       Type & Model of Pump: Omnipod Insulin Type: Currently using Novolog    PUMP STATISTICS:  Average BG: n/a Average Daily Carbs (g): 5.7 Average Total Daily Insulin: 38.8 Average Daily Basal: 34 (88 %) Average Daily Bolus: 4.7(12 %)     HOME DIABETES REGIMEN:  Metformin 500 mg XR , 2 tablet with breakfast  Farxiga 10 mg daily  Ozempic 1  mg weekly -not taking     Statin: Yes ACE-I/ARB: no     CONTINUOUS GLUCOSE MONITORING RECORD INTERPRETATION  :n/a        DIABETIC COMPLICATIONS: Microvascular complications:  Neuropathy, retinopathy Denies: CKD Last Eye Exam: Completed   Macrovascular complications:  CAD ( S/P CABG 10/2019) Denies:  CVA, PVD   HISTORY:  Past Medical History:  Past Medical History:  Diagnosis Date   Angina pectoris (HCC) 10/06/2019   Anxiety    Arthritis    fingers   Cervical dysplasia    Coronary artery disease    Depression    Diabetes mellitus  Type II   Dyspnea 11/22/2013   GERD (gastroesophageal reflux disease)    High cholesterol    History of kidney stones    passed   Hypertension    no meds now   Kidney stones    Moderate episode of recurrent major depressive disorder (HCC) 12/28/2019   Neuropathy    feet and legs   Nuclear sclerotic cataract of both eyes 09/2019   Dr. Shea Evans   PONV (postoperative nausea and vomiting)    one time   Sleep apnea    lost 70lbs no cpap x34yrs now   Stage 3a chronic kidney disease (HCC) 04/29/2020   Past Surgical History:  Past Surgical History:  Procedure Laterality Date   CARDIAC CATHETERIZATION     4-68yrs ago   CERVICAL CONIZATION W/BX N/A 05/04/2018   Procedure: CONIZATION CERVIX WITH BIOPSY - COLD KNIFE;  Surgeon: Hermina Staggers, MD;  Location: [facility];  Service: Gynecology;  Laterality: N/A;   CHOLECYSTECTOMY     CORONARY ARTERY BYPASS GRAFT N/A 10/24/2019   Procedure: CORONARY ARTERY BYPASS GRAFTING  (CABG) times four, using left radial artery harvested endoscopically and right greater saphenous vein harvested endoscopically.;  Surgeon: Kerin Perna, MD;  Location: [facility] OR;  Service: Open Heart Surgery;  Laterality: N/A;   ELBOW FRACTURE SURGERY Left    ELBOW SURGERY Right    ENDOMETRIAL ABLATION     6 years ago   INCISION AND DRAINAGE     for boil- buttocks   INCISION AND DRAINAGE Left    Leg, Nephrotoxin fasciotimy   IRRIGATION AND DEBRIDEMENT ABSCESS Right 03/17/2017   Procedure: IRRIGATION AND DEBRIDEMENT RIGHT THIGH ABSCESS;  Surgeon: Karie Soda, MD;  Location: [facility] ORS;  Service: General;  Laterality: Right;   LAPAROSCOPIC APPENDECTOMY  10/04/2011   Procedure: APPENDECTOMY LAPAROSCOPIC;  Surgeon: Rulon Abide, DO;  Location: [facility];  Service: General;  Laterality: N/A;   LEFT HEART CATH AND CORONARY ANGIOGRAPHY N/A 10/06/2019   Procedure: LEFT HEART CATH AND CORONARY ANGIOGRAPHY;  Surgeon: [place], Peter M, MD;  Location: [facility];  Service: Cardiovascular;  Laterality: N/A;   RADIAL ARTERY HARVEST Left 10/24/2019   Procedure: Radial Artery Harvest;  Surgeon: Kerin Perna, MD;  Location: [facility] OR;  Service: Open Heart Surgery;  Laterality: Left;   TEE WITHOUT CARDIOVERSION N/A 10/24/2019   Procedure: TRANSESOPHAGEAL ECHOCARDIOGRAM (TEE);  Surgeon: Donata Clay, Theron Arista, MD;  Location: [facility] OR;  Service: Open Heart Surgery;  Laterality: N/A;   TENDON RECONSTRUCTION Right 07/02/2021   Procedure: Right wrist 6 dorsal compartment tenosynovectomy and tendon repair Right elbow lateral epicondylar debridement and tendon repair;  Surgeon: Bradly Bienenstock, MD;  Location: [facility] OR;  Service: Orthopedics;  Laterality: Right;   WRIST FRACTURE SURGERY Left    WRIST SURGERY Right    Social History:  reports that she has been smoking e-cigarettes and cigarettes. She started smoking about 30 years ago. She has a 48 pack-year smoking history. She has never been exposed to tobacco smoke. She  has never used smokeless tobacco. She reports current alcohol use. She reports that she does not use drugs. Family History:  Family History  Problem Relation Age of Onset   Aneurysm Mother    Pancreatic cancer Mother    Heart attack Father    Stroke Father    Heart disease Sister    Breast cancer Paternal Grandmother    Congestive Heart Failure Sister    Osteoarthritis Sister    Osteoarthritis Sister  HOME MEDICATIONS: Allergies as of 06/30/2023       Reactions   Sulfa Antibiotics Hives   Codeine Itching        Medication List        Accurate as of June 30, 2023 10:08 AM. If you have any questions, ask your nurse or doctor.          STOP taking these medications    atorvastatin 80 MG tablet Commonly known as: LIPITOR Stopped by: Johnney Ou Letty Salvi   Dexcom G6 Transmitter Misc Stopped by: Johnney Ou Aadvika Konen   Semaglutide (1 MG/DOSE) 4 MG/3ML Sopn Replaced by: Semaglutide(0.25 or 0.5MG /DOS) 2 MG/3ML Sopn Stopped by: Johnney Ou Ilamae Geng       TAKE these medications    Accu-Chek FastClix Lancets Misc Check blood sugar 4x daily  DX. E11.65   Accu-Chek Guide test strip Generic drug: glucose blood Check blood sugar 4x daily DX E11.65   Accu-Chek Guide w/Device Kit Check blood sugar 4x daily   aspirin EC 81 MG tablet Take by mouth.   cephALEXin 500 MG capsule Commonly known as: Keflex Take 1 capsule (500 mg total) by mouth 2 (two) times daily.   dapagliflozin propanediol 10 MG Tabs tablet Commonly known as: Farxiga Take 1 tablet (10 mg total) by mouth daily before breakfast.   Dexcom G7 Sensor Misc 1 Device by Does not apply route as directed.   Diflucan 150 MG tablet Generic drug: fluconazole 1 tablet Orally once and again in 72 hrs if symptoms persist   docusate sodium 100 MG capsule Commonly known as: Colace Take 1 capsule (100 mg total) by mouth 2 (two) times daily.   esomeprazole 20 MG capsule Commonly known as:  NEXIUM Take 20 mg by mouth daily.   ezetimibe 10 MG tablet Commonly known as: ZETIA TAKE 1 TABLET(10 MG) BY MOUTH EVERY EVENING   FLUoxetine 40 MG capsule Commonly known as: PROZAC TAKE 1 CAPSULE(40 MG) BY MOUTH DAILY What changed:  how much to take how to take this when to take this   fluticasone 50 MCG/ACT nasal spray Commonly known as: FLONASE Place 2 sprays into both nostrils daily as needed for allergies or rhinitis.   furosemide 20 MG tablet Commonly known as: LASIX Take 1 tablet (20 mg total) by mouth every other day.   insulin lispro 100 UNIT/ML injection Commonly known as: HumaLOG INJECT A MAX OF 60 UNITS PER PUMP AS DIRECTED   Insulin Syringes (Disposable) U-100 1 ML Misc 1 Device by Does not apply route as directed.   Melatonin 10 MG Caps Take 10 mg by mouth at bedtime as needed (sleep).   metFORMIN 500 MG 24 hr tablet Commonly known as: GLUCOPHAGE-XR 2 tabs daily   metoprolol succinate 25 MG 24 hr tablet Commonly known as: TOPROL-XL Take 0.5 tablets (12.5 mg total) by mouth daily.   nitroGLYCERIN 0.4 MG SL tablet Commonly known as: NITROSTAT Place 1 tablet (0.4 mg total) under the tongue every 5 (five) minutes as needed for chest pain.   Omnipod 5 G7 Pods (Gen 5) Misc 1 Device by Does not apply route every other day.   pregabalin 300 MG capsule Commonly known as: Lyrica Take 1 capsule (300 mg total) by mouth 2 (two) times daily.   Repatha SureClick 140 MG/ML Soaj Generic drug: Evolocumab Inject 140 mg into the skin every 14 (fourteen) days.   Semaglutide(0.25 or 0.5MG /DOS) 2 MG/3ML Sopn Inject 0.5 mg into the skin once a week. Replaces: Semaglutide (1 MG/DOSE) 4 MG/3ML  Sopn Started by: Johnney Ou Laurianne Floresca         OBJECTIVE:   Vital Signs: BP 114/76 (BP Location: Left Arm, Patient Position: Sitting, Cuff Size: Large)   Pulse 73   Ht 5' 5.8" (1.671 m)   Wt 286 lb (129.7 kg)   LMP  (LMP Unknown)   SpO2 97%   BMI 46.44 kg/m     Wt  Readings from Last 3 Encounters:  06/30/23 286 lb (129.7 kg)  03/01/23 287 lb (130.2 kg)  11/30/22 288 lb (130.6 kg)     Exam: General: Pt appears well and is in NAD  Lungs: Clear with good BS bilat with no rales, rhonchi, or wheezes  Heart: RRR   Extremities: No pretibial edema  Neuro: MS is good with appropriate affect, pt is alert and Ox3      DM foot exam:06/30/2023   The skin of the feet is intact without sores or ulcerations. The pedal pulses are undetectable on today's exam  The sensation is normal to a screening 5.07, 10 gram monofilament bilaterally   DATA REVIEWED:  Lab Results  Component Value Date   HGBA1C 15.0 (A) 06/30/2023   HGBA1C 10.5 (A) 10/05/2022   HGBA1C 10.3 (A) 04/17/2022     Latest Reference Range & Units 09/01/22 10:50  Sodium 134 - 144 mmol/L 142  Potassium 3.5 - 5.2 mmol/L 4.5  Chloride 96 - 106 mmol/L 101  CO2 20 - 29 mmol/L 25  Glucose 70 - 99 mg/dL 75  BUN 6 - 24 mg/dL 29 (H)  Creatinine 0.45 - 1.00 mg/dL 4.09 (H)  Calcium 8.7 - 10.2 mg/dL 9.7  BUN/Creatinine Ratio 9 - 23  22  eGFR >59 mL/min/1.73 47 (L)  Magnesium 1.6 - 2.3 mg/dL 2.2     ASSESSMENT / PLAN / RECOMMENDATIONS:   1) Type 2 Diabetes Mellitus, Poorly   controlled, With Neuropathic, retinopathic , CKD III and macrovascular complications - Most recent A1c of 15.0 %. Goal A1c < 7.0 %.     -Pt with worsening glycemic control due to variable reasons - She has intolerance to Victoza and Trulicity -She is intolerant to 1 mg dose of Ozempic, will restart at a smaller dose as below -We will upgrade to Dexcom/OmniPod to G7 -Again emphasized the importance of optimizing glucose control to prevent microvascular complications   MEDICATIONS: -Restart Ozempic 0.25 mg weekly for 6 weeks, then increase to 0.5 mg weekly - Continue Metformin 500 mg XR , 2 tablet with breakfast  - Continue Farxiga 10mg  , 1 tablet daily with Breakfast     Pump   Omnipod Settings   Insulin type    Novolog     Basal rate       0000-0600 2.35 u/h    0600-0000 2.10          I:C ratio       0000 1:1 enter #5 TIDWAC                  Sensitivity       0000  20      Goal       0000  120     EDUCATION / INSTRUCTIONS: BG monitoring instructions: Patient is instructed to check her blood sugars 4 times a day, preprandial and bedtime Call [place] [facility] if: BG persistently < 70  I reviewed the Rule of 15 for the treatment of hypoglycemia in detail with the patient. Literature supplied.     2) Diabetic complications:  Eye: Does  have known diabetic retinopathy.  Neuro/ Feet: Does have known diabetic peripheral neuropathy .  Renal: Patient does not have known baseline CKD. She   is not on an ACEI/ARB at present.   3) Peripheral neuropathy   -Tolerating Lyrica Medication Continue Lyrica 300 twice daily  4) Dyslipidemia :   - Lipid panel at goal  - Intolerant to atorvastatin due to myalgias - ON repatha    F/U in 6 months    Signed electronically by: Lyndle Herrlich, MD  [facility] Endocrinology  [facility] [address] [place] [place], [place] [address] Phone: [phone] FAX: [phone]   CC: [name], MD [address] [place] [address] Phone: [phone]  Fax: [phone]  Return to Endocrinology clinic as below: Future Appointments  Date Time Provider Department Center  07/05/2023  2:10 PM Gearldine Bienenstock, PA-C [facility] None  10/08/2023  8:50 AM Akon Reinoso, Konrad Dolores, MD [facility] None

## 2023-06-30 NOTE — Patient Instructions (Addendum)
-   Restart Ozempic 0.25 mg weekly  for 6 weeks, than increase to 0.5 mg weekly  - Continue Metformin 500 mg XR , 2 tablet with breakfast  - Continue  Farxiga 10 mg , 1 tablet daily with Breakfast     HOW TO TREAT LOW BLOOD SUGARS (Blood sugar LESS THAN 70 MG/DL) Please follow the RULE OF 15 for the treatment of hypoglycemia treatment (when your (blood sugars are less than 70 mg/dL)   STEP 1: Take 15 grams of carbohydrates when your blood sugar is low, which includes:  3-4 GLUCOSE TABS  OR 3-4 OZ OF JUICE OR REGULAR SODA OR ONE TUBE OF GLUCOSE GEL    STEP 2: RECHECK blood sugar in 15 MINUTES STEP 3: If your blood sugar is still low at the 15 minute recheck --> then, go back to STEP 1 and treat AGAIN with another 15 grams of carbohydrates.

## 2023-07-05 ENCOUNTER — Encounter: Payer: Self-pay | Admitting: Physician Assistant

## 2023-07-05 ENCOUNTER — Ambulatory Visit: Payer: Medicaid Other | Attending: Rheumatology | Admitting: Physician Assistant

## 2023-07-05 VITALS — BP 111/77 | HR 61 | Ht 68.5 in | Wt 291.2 lb

## 2023-07-05 DIAGNOSIS — F32A Depression, unspecified: Secondary | ICD-10-CM

## 2023-07-05 DIAGNOSIS — Z8719 Personal history of other diseases of the digestive system: Secondary | ICD-10-CM

## 2023-07-05 DIAGNOSIS — Z87442 Personal history of urinary calculi: Secondary | ICD-10-CM

## 2023-07-05 DIAGNOSIS — F419 Anxiety disorder, unspecified: Secondary | ICD-10-CM

## 2023-07-05 DIAGNOSIS — E785 Hyperlipidemia, unspecified: Secondary | ICD-10-CM

## 2023-07-05 DIAGNOSIS — M7062 Trochanteric bursitis, left hip: Secondary | ICD-10-CM

## 2023-07-05 DIAGNOSIS — M19071 Primary osteoarthritis, right ankle and foot: Secondary | ICD-10-CM

## 2023-07-05 DIAGNOSIS — L0100 Impetigo, unspecified: Secondary | ICD-10-CM

## 2023-07-05 DIAGNOSIS — M7061 Trochanteric bursitis, right hip: Secondary | ICD-10-CM

## 2023-07-05 DIAGNOSIS — M65332 Trigger finger, left middle finger: Secondary | ICD-10-CM

## 2023-07-05 DIAGNOSIS — E1100 Type 2 diabetes mellitus with hyperosmolarity without nonketotic hyperglycemic-hyperosmolar coma (NKHHC): Secondary | ICD-10-CM

## 2023-07-05 DIAGNOSIS — M19042 Primary osteoarthritis, left hand: Secondary | ICD-10-CM

## 2023-07-05 DIAGNOSIS — R296 Repeated falls: Secondary | ICD-10-CM

## 2023-07-05 DIAGNOSIS — M7060 Trochanteric bursitis, unspecified hip: Secondary | ICD-10-CM

## 2023-07-05 DIAGNOSIS — Z87891 Personal history of nicotine dependence: Secondary | ICD-10-CM

## 2023-07-05 DIAGNOSIS — H2513 Age-related nuclear cataract, bilateral: Secondary | ICD-10-CM

## 2023-07-05 DIAGNOSIS — I10 Essential (primary) hypertension: Secondary | ICD-10-CM

## 2023-07-05 DIAGNOSIS — M25562 Pain in left knee: Secondary | ICD-10-CM

## 2023-07-05 DIAGNOSIS — M19041 Primary osteoarthritis, right hand: Secondary | ICD-10-CM

## 2023-07-05 DIAGNOSIS — M19072 Primary osteoarthritis, left ankle and foot: Secondary | ICD-10-CM

## 2023-07-05 DIAGNOSIS — R87613 High grade squamous intraepithelial lesion on cytologic smear of cervix (HGSIL): Secondary | ICD-10-CM

## 2023-07-05 DIAGNOSIS — M65341 Trigger finger, right ring finger: Secondary | ICD-10-CM

## 2023-07-05 DIAGNOSIS — G8929 Other chronic pain: Secondary | ICD-10-CM

## 2023-07-05 DIAGNOSIS — G4733 Obstructive sleep apnea (adult) (pediatric): Secondary | ICD-10-CM

## 2023-07-05 DIAGNOSIS — Z951 Presence of aortocoronary bypass graft: Secondary | ICD-10-CM

## 2023-07-05 DIAGNOSIS — M7631 Iliotibial band syndrome, right leg: Secondary | ICD-10-CM

## 2023-07-16 IMAGING — DX DG CHEST 2V
2 series · 2 of 2 positions shown · non-contrast
Comparison: Chest x-ray dated December 27, 2020

CLINICAL DATA: Chest pain

EXAM:
CHEST - 2 VIEW

[chest pa]
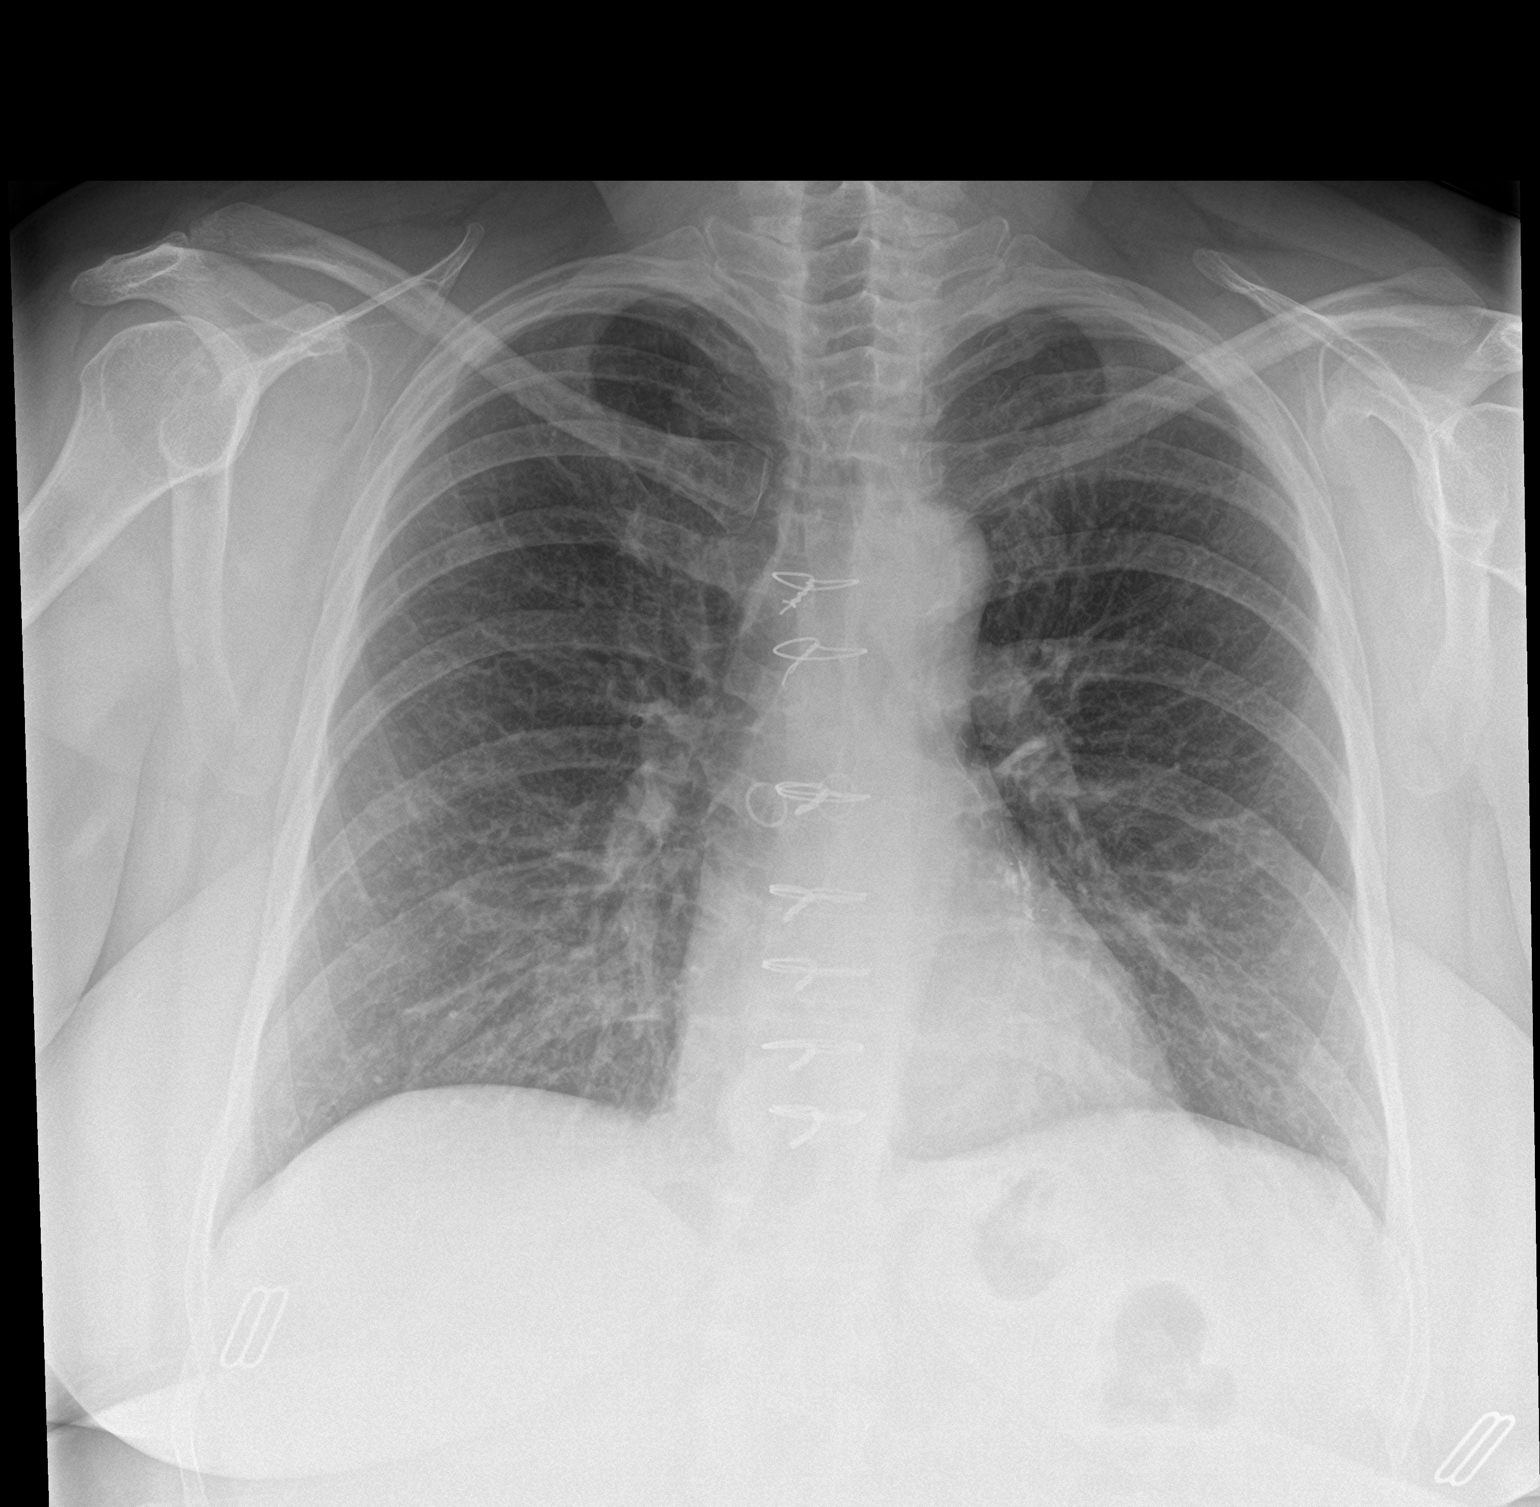

[chest lat]
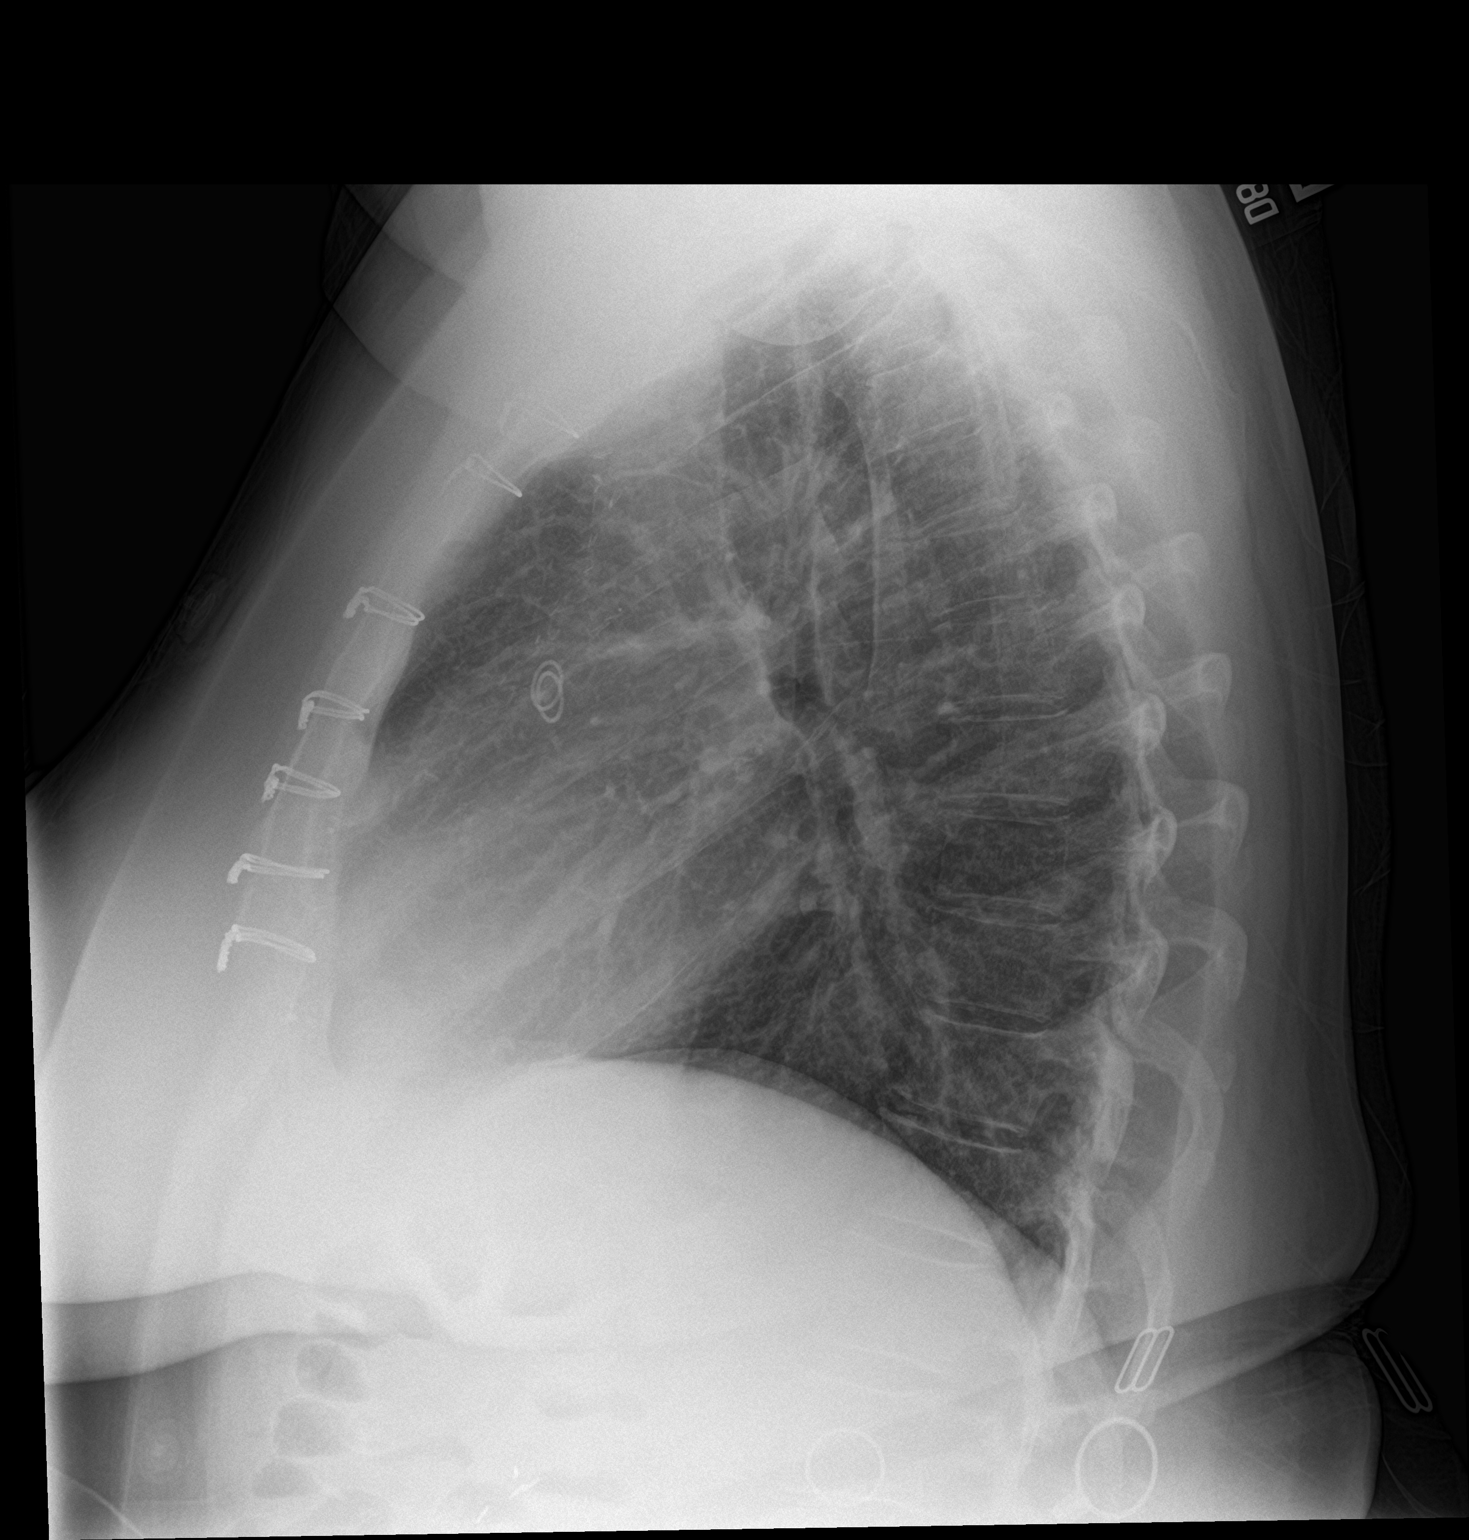

[2 of 2 positions shown; findings below may reference images not displayed]

FINDINGS: The heart size and mediastinal contours are within normal limits.
Prior median sternotomy and CABG. Both lungs are clear. The
visualized skeletal structures are unremarkable.
IMPRESSION: No active cardiopulmonary disease.

## 2023-07-21 ENCOUNTER — Telehealth: Payer: Self-pay | Admitting: Pharmacist Clinician (PhC)/ Clinical Pharmacy Specialist

## 2023-07-21 DIAGNOSIS — E785 Hyperlipidemia, unspecified: Secondary | ICD-10-CM

## 2023-07-21 NOTE — Telephone Encounter (Signed)
Lipid labs ordered  

## 2023-08-13 ENCOUNTER — Other Ambulatory Visit: Payer: Self-pay | Admitting: Cardiology

## 2023-10-07 NOTE — Progress Notes (Deleted)
 Name: [name]  Age/ Sex: 53 y.o., female   MRN/ DOB: [id number], Jan 28, 1971     PCP: Chrystal Lamarr RAMAN, MD   Reason for Endocrinology Evaluation: Type 2 Diabetes Mellitus  Initial Endocrine Consultative Visit: 11/16/2019    PATIENT IDENTIFIER: [name] is a 53 y.o. female with a past medical history of CAD, T2DM and HTN. The patient has followed with Endocrinology clinic since 11/16/2019 for consultative assistance with management of her diabetes.  DIABETIC HISTORY:  [name] was diagnosed with DM in 2004. She is intolerant to higher doses of metformin , nor to Trulicity / Victoza   due to diarrhea. She was able to tolerate Victoza  which was started 05/2019. Her hemoglobin A1c has ranged from 7.2% in 2013, peaking at >15.0% in 2019.    On her initial visit to our clinic she had an A1c of 15.1% . She was on Levemir , Metformin   And victoza  and we added Farxiga .   Victoza  was stopped 05/2020 due to severe nausea  She was started on the Omnipod 07/2020   Victoza  stopped 05/2020 due to nausea and diarrhea  Metformin  reduced by 50% due to GI issues     Switched Gabapentin  to Lyrica  10/2020 SUBJECTIVE:   During the last visit (06/30/2023): A1c 15.0%.     Today (10/07/2023): [name] is here for a follow up on diabetes management.  She typically uses dexcom for glucose checks but has not been able to get her Dexcom due to insurance denial   She had a follow-up with cardiology for CAD Continues with Lyrica   Denies nausea, vomiting  Has noted constipation  Denies dizziness  Stopped Ozempic  due to diarrhea with 1 mg     Follows with nephrology and rheumatology   This patient with type 2 diabetes is treated with Omnipod (insulin  pump).   Pump and meter download:   Pump   Omnipod Settings   Insulin  type   Novolog      Basal rate       0000-0600 2.35 u/h    0600-0000 2.10          I:C ratio       0000 1:1 enter #5 TIDWAC                   Sensitivity       0000  20      Goal       0000  120    Type & Model of Pump: Omnipod Insulin  Type: Currently using Novolog     PUMP STATISTICS:  Average BG: n/a Average Daily Carbs (g): 5.7 Average Total Daily Insulin : 38.8 Average Daily Basal: 34 (88 %) Average Daily Bolus: 4.7(12 %)     HOME DIABETES REGIMEN:  Metformin  500 mg XR , 2 tablet with breakfast  Farxiga  10 mg daily  Ozempic  0.5  mg weekly     Statin: Yes ACE-I/ARB: no     CONTINUOUS GLUCOSE MONITORING RECORD INTERPRETATION  :n/a        DIABETIC COMPLICATIONS: Microvascular complications:  Neuropathy, retinopathy Denies: CKD Last Eye Exam: Completed   Macrovascular complications:  CAD ( S/P CABG 10/2019) Denies:  CVA, PVD   HISTORY:  Past Medical History:  Past Medical History:  Diagnosis Date   Angina pectoris (HCC) 10/06/2019   Anxiety    Arthritis    fingers   Cervical dysplasia    Coronary artery disease    Depression    Diabetes mellitus    Type II  Dyspnea 11/22/2013   GERD (gastroesophageal reflux disease)    High cholesterol    History of kidney stones    passed   Hypertension    no meds now   Kidney stones    Moderate episode of recurrent major depressive disorder (HCC) 12/28/2019   Neuropathy    feet and legs   Nuclear sclerotic cataract of both eyes 09/2019   Dr. Abigail   PONV (postoperative nausea and vomiting)    one time   Sleep apnea    lost 70lbs no cpap x37yrs now   Stage 3a chronic kidney disease (HCC) 04/29/2020   Past Surgical History:  Past Surgical History:  Procedure Laterality Date   CARDIAC CATHETERIZATION     4-63yrs ago   CERVICAL CONIZATION W/BX N/A 05/04/2018   Procedure: CONIZATION CERVIX WITH BIOPSY - COLD KNIFE;  Surgeon: Lorence Ozell CROME, MD;  Location:  [facility];  Service: Gynecology;  Laterality: N/A;   CHOLECYSTECTOMY     CORONARY ARTERY BYPASS GRAFT N/A 10/24/2019   Procedure: CORONARY ARTERY BYPASS GRAFTING  (CABG) times four, using left radial artery harvested endoscopically and right greater saphenous vein harvested endoscopically.;  Surgeon: Fleeta Hanford Coy, MD;  Location: [facility] OR;  Service: Open Heart Surgery;  Laterality: N/A;   ELBOW FRACTURE SURGERY Left    ELBOW SURGERY Right    ENDOMETRIAL ABLATION     6 years ago   INCISION AND DRAINAGE     for boil- buttocks   INCISION AND DRAINAGE Left    Leg, Nephrotoxin fasciotimy   IRRIGATION AND DEBRIDEMENT ABSCESS Right 03/17/2017   Procedure: IRRIGATION AND DEBRIDEMENT RIGHT THIGH ABSCESS;  Surgeon: Sheldon Standing, MD;  Location: [facility] ORS;  Service: General;  Laterality: Right;   LAPAROSCOPIC APPENDECTOMY  10/04/2011   Procedure: APPENDECTOMY LAPAROSCOPIC;  Surgeon: Redell Alm Faith, DO;  Location: [facility] ORS;  Service: General;  Laterality: N/A;   LEFT HEART CATH AND CORONARY ANGIOGRAPHY N/A 10/06/2019   Procedure: LEFT HEART CATH AND CORONARY ANGIOGRAPHY;  Surgeon: Jordan, Peter M, MD;  Location: [facility];  Service: Cardiovascular;  Laterality: N/A;   RADIAL ARTERY HARVEST Left 10/24/2019   Procedure: Radial Artery Harvest;  Surgeon: Fleeta Hanford Coy, MD;  Location: [facility] OR;  Service: Open Heart Surgery;  Laterality: Left;   TEE WITHOUT CARDIOVERSION N/A 10/24/2019   Procedure: TRANSESOPHAGEAL ECHOCARDIOGRAM (TEE);  Surgeon: Fleeta Hanford, Coy, MD;  Location: [facility] OR;  Service: Open Heart Surgery;  Laterality: N/A;   TENDON RECONSTRUCTION Right 07/02/2021   Procedure: Right wrist 6 dorsal compartment tenosynovectomy and tendon repair Right elbow lateral epicondylar debridement and tendon repair;  Surgeon: Shari Easter, MD;  Location: [facility] OR;  Service: Orthopedics;  Laterality: Right;   WRIST FRACTURE SURGERY Left    WRIST SURGERY Right    Social History:  reports that she quit smoking about 6 years ago. Her smoking use included cigarettes. She started smoking about 30 years ago. She has a 48 pack-year smoking history. She has been exposed to  tobacco smoke. She has never used smokeless tobacco. She reports current alcohol use. She reports that she does not use drugs. Family History:  Family History  Problem Relation Age of Onset   Aneurysm Mother    Pancreatic cancer Mother    Heart attack Father    Stroke Father    Heart disease Sister    Breast cancer Paternal Grandmother    Congestive Heart Failure Sister    Osteoarthritis Sister    Osteoarthritis Sister  HOME MEDICATIONS: Allergies as of 10/08/2023       Reactions   Sulfa Antibiotics Hives   Codeine Itching        Medication List        Accurate as of October 07, 2023  8:55 AM. If you have any questions, ask your nurse or doctor.          Accu-Chek FastClix Lancets Misc Check blood sugar 4x daily  DX. E11.65   Accu-Chek Guide test strip Generic drug: glucose blood Check blood sugar 4x daily DX E11.65   Accu-Chek Guide w/Device Kit Check blood sugar 4x daily   aspirin  EC 81 MG tablet Take by mouth.   cephALEXin  500 MG capsule Commonly known as: Keflex  Take 1 capsule (500 mg total) by mouth 2 (two) times daily.   dapagliflozin  propanediol 10 MG Tabs tablet Commonly known as: Farxiga  Take 1 tablet (10 mg total) by mouth daily before breakfast.   Dexcom G7 Sensor Misc 1 Device by Does not apply route as directed.   Diflucan  150 MG tablet Generic drug: fluconazole  1 tablet Orally once and again in 72 hrs if symptoms persist   docusate sodium  100 MG capsule Commonly known as: Colace Take 1 capsule (100 mg total) by mouth 2 (two) times daily.   esomeprazole 20 MG capsule Commonly known as: NEXIUM Take 20 mg by mouth daily.   ezetimibe  10 MG tablet Commonly known as: ZETIA  TAKE 1 TABLET(10 MG) BY MOUTH EVERY EVENING   FLUoxetine  40 MG capsule Commonly known as: PROZAC  TAKE 1 CAPSULE(40 MG) BY MOUTH DAILY What changed:  how much to take how to take this when to take this   fluticasone  50 MCG/ACT nasal spray Commonly known  as: FLONASE  Place 2 sprays into both nostrils daily as needed for allergies or rhinitis.   furosemide  20 MG tablet Commonly known as: LASIX  Take 1 tablet (20 mg total) by mouth every other day.   insulin  lispro 100 UNIT/ML injection Commonly known as: HumaLOG  INJECT A MAX OF 60 UNITS PER PUMP AS DIRECTED   Insulin  Syringes (Disposable) U-100 1 ML Misc 1 Device by Does not apply route as directed.   Melatonin 10 MG Caps Take 10 mg by mouth at bedtime as needed (sleep).   metFORMIN  500 MG 24 hr tablet Commonly known as: GLUCOPHAGE -XR 2 tabs daily   metoprolol  succinate 25 MG 24 hr tablet Commonly known as: TOPROL -XL TAKE HALF A TABLET BY MOUTH DAILY   nitroGLYCERIN  0.4 MG SL tablet Commonly known as: NITROSTAT  Place 1 tablet (0.4 mg total) under the tongue every 5 (five) minutes as needed for chest pain.   Omnipod 5 G7 Pods (Gen 5) Misc 1 Device by Does not apply route every other day.   pregabalin  300 MG capsule Commonly known as: Lyrica  Take 1 capsule (300 mg total) by mouth 2 (two) times daily.   Repatha  SureClick 140 MG/ML Soaj Generic drug: Evolocumab  Inject 140 mg into the skin every 14 (fourteen) days.   Semaglutide (0.25 or 0.5MG /DOS) 2 MG/3ML Sopn Inject 0.5 mg into the skin once a week. What changed: how much to take         OBJECTIVE:   Vital Signs: LMP  (LMP Unknown)     Wt Readings from Last 3 Encounters:  07/05/23 291 lb 3.2 oz (132.1 kg)  06/30/23 286 lb (129.7 kg)  03/01/23 287 lb (130.2 kg)     Exam: General: Pt appears well and is in NAD  Lungs: Clear with good  BS bilat with no rales, rhonchi, or wheezes  Heart: RRR   Extremities: No pretibial edema  Neuro: MS is good with appropriate affect, pt is alert and Ox3      DM foot exam:06/30/2023   The skin of the feet is intact without sores or ulcerations. The pedal pulses are undetectable on today's exam  The sensation is normal to a screening 5.07, 10 gram monofilament  bilaterally   DATA REVIEWED:  Lab Results  Component Value Date   HGBA1C 15.0 (A) 06/30/2023   HGBA1C 10.5 (A) 10/05/2022   HGBA1C 10.3 (A) 04/17/2022     Latest Reference Range & Units 09/01/22 10:50  Sodium 134 - 144 mmol/L 142  Potassium 3.5 - 5.2 mmol/L 4.5  Chloride 96 - 106 mmol/L 101  CO2 20 - 29 mmol/L 25  Glucose 70 - 99 mg/dL 75  BUN 6 - 24 mg/dL 29 (H)  Creatinine 9.42 - 1.00 mg/dL 8.65 (H)  Calcium  8.7 - 10.2 mg/dL 9.7  BUN/Creatinine Ratio 9 - 23  22  eGFR >59 mL/min/1.73 47 (L)  Magnesium  1.6 - 2.3 mg/dL 2.2     ASSESSMENT / PLAN / RECOMMENDATIONS:   1) Type 2 Diabetes Mellitus, Poorly   controlled, With Neuropathic, retinopathic , CKD III and macrovascular complications - Most recent A1c of 15.0 %. Goal A1c < 7.0 %.     -Pt with worsening glycemic control due to variable reasons - She has intolerance to Victoza  and Trulicity  -She is intolerant to 1 mg dose of Ozempic , will restart at a smaller dose as below -We will upgrade to Dexcom/OmniPod to G7 -Again emphasized the importance of optimizing glucose control to prevent microvascular complications   MEDICATIONS: -Restart Ozempic  0.25 mg weekly for 6 weeks, then increase to 0.5 mg weekly - Continue Metformin  500 mg XR , 2 tablet with breakfast  - Continue Farxiga  10mg  , 1 tablet daily with Breakfast     Pump   Omnipod Settings   Insulin  type   Novolog      Basal rate       0000-0600 2.35 u/h    0600-0000 2.10          I:C ratio       0000 1:1 enter #5 TIDWAC                  Sensitivity       0000  20      Goal       0000  120     EDUCATION / INSTRUCTIONS: BG monitoring instructions: Patient is instructed to check her blood sugars 4 times a day, preprandial and bedtime Call  Endocrinology clinic if: BG persistently < 70  I reviewed the Rule of 15 for the treatment of hypoglycemia in detail with the patient. Literature supplied.     2) Diabetic complications:  Eye: Does   have known diabetic retinopathy.  Neuro/ Feet: Does have known diabetic peripheral neuropathy .  Renal: Patient does not have known baseline CKD. She   is not on an ACEI/ARB at present.   3) Peripheral neuropathy   -Tolerating Lyrica  Medication Continue Lyrica  300 twice daily  4) Dyslipidemia :   - Lipid panel at goal  - Intolerant to atorvastatin  due to myalgias - ON repatha     F/U in 6 months    Signed electronically by: Stefano Redgie Butts, MD  [facility] Endocrinology  [facility] [address] [place], [place] [address] Phone: [phone]  FAX: [phone]   CC: Chrystal Lamarr RAMAN, MD [address] [place] [address] Phone: [phone]  Fax: [phone]  Return to Endocrinology clinic as below: Future Appointments  Date Time Provider Department Center  10/08/2023  8:50 AM Makayia Duplessis, Donell Cardinal, MD [facility] None  07/04/2024  3:00 PM Dolphus Reiter, MD [facility] None

## 2023-10-08 ENCOUNTER — Ambulatory Visit: Payer: Medicaid Other | Admitting: Internal Medicine

## 2023-10-10 ENCOUNTER — Other Ambulatory Visit: Payer: Self-pay | Admitting: Cardiology

## 2023-11-07 ENCOUNTER — Other Ambulatory Visit: Payer: Self-pay | Admitting: Internal Medicine

## 2023-11-08 ENCOUNTER — Other Ambulatory Visit: Payer: Self-pay | Admitting: Internal Medicine

## 2024-01-14 ENCOUNTER — Other Ambulatory Visit: Payer: Self-pay | Admitting: Cardiology

## 2024-02-24 ENCOUNTER — Other Ambulatory Visit (HOSPITAL_COMMUNITY): Payer: Self-pay

## 2024-02-24 ENCOUNTER — Telehealth: Payer: Self-pay

## 2024-02-24 NOTE — Telephone Encounter (Signed)
 Pharmacy Patient Advocate Encounter   Received notification from CoverMyMeds that prior authorization for Farxiga  10mg  is required/requested.   Insurance verification completed.   The patient is insured through [facility] .   Per test claim:  The generic is preferred by the insurance.  If suggested medication is appropriate, Please send in a new RX and discontinue this one. If not, please advise as to why it's not appropriate so that we may request a Prior Authorization. Please note, some preferred medications may still require a PA.  If the suggested medications have not been trialed and there are no contraindications to their use, the PA will not be submitted, as it will not be approved.   As long as generic is used, it is covered with no copay.

## 2024-03-04 ENCOUNTER — Other Ambulatory Visit: Payer: Self-pay | Admitting: Adult Health

## 2024-03-07 ENCOUNTER — Telehealth: Payer: Self-pay | Admitting: Cardiology

## 2024-03-07 NOTE — Telephone Encounter (Signed)
 Dr. Chrystal office called to inform Dr. Kate that she will be sending over pt recent lab results but she would like pt to restart Zetia  10mg  lately. Please advise.

## 2024-03-08 ENCOUNTER — Encounter: Payer: Self-pay | Admitting: *Deleted

## 2024-03-08 ENCOUNTER — Other Ambulatory Visit: Payer: Self-pay | Admitting: *Deleted

## 2024-03-08 ENCOUNTER — Ambulatory Visit: Payer: Self-pay | Admitting: Cardiology

## 2024-03-08 MED ORDER — EZETIMIBE 10 MG PO TABS: 10.0000 mg | ORAL_TABLET | Freq: Every day | ORAL | 3 refills | Status: AC

## 2024-03-08 NOTE — Telephone Encounter (Signed)
 I was not aware she had stopped Zetia , agree with restarting.  She has not been seen in clinic for a while, can we schedule follow-up appointment for her with either me or APP in the next month or so?

## 2024-03-08 NOTE — Telephone Encounter (Signed)
 Called patient and sent mychart message to restart Zetia  and to call office for appointment .

## 2024-03-14 ENCOUNTER — Other Ambulatory Visit (HOSPITAL_COMMUNITY): Payer: Self-pay

## 2024-03-14 ENCOUNTER — Telehealth: Payer: Self-pay | Admitting: Pharmacy Technician

## 2024-03-14 NOTE — Telephone Encounter (Addendum)
 Pharmacy Patient Advocate Encounter   Received notification from CoverMyMeds that prior authorization for Dexcom G7 Sensor is required/requested.   Insurance verification completed.   The patient is insured through [facility] .  Key: [id number]   Per test claim:  PA required, but the patient needs an appointment. Last seen 06/30/23.

## 2024-03-20 ENCOUNTER — Telehealth: Payer: Self-pay

## 2024-03-20 ENCOUNTER — Other Ambulatory Visit (HOSPITAL_COMMUNITY): Payer: Self-pay

## 2024-03-20 NOTE — Telephone Encounter (Signed)
 Pharmacy Patient Advocate Encounter   Received notification from CoverMyMeds that prior authorization for REPATHA  is required/requested.   Insurance verification completed.   The patient is insured through [facility] .   Per test claim: PA required; However, NEW/RECENT labs/notes are needed to complete & submit PA request. Please see below.   PLEASE ADVISE

## 2024-03-22 NOTE — Telephone Encounter (Signed)
 PA submitted with 03/02/24 labs

## 2024-03-22 NOTE — Telephone Encounter (Signed)
 Labs on 03/02/24 were elevated so plan may not see that as a positive clinical response to therapy, will be up to reviewer. PA submitted with labs from 03/02/24 (media).

## 2024-03-23 NOTE — Telephone Encounter (Signed)
 Pharmacy Patient Advocate Encounter  Received notification from [facility] that Prior Authorization for REPATHA  has been DENIED.  Full denial letter will be uploaded to the media tab. See denial reason below.   DENIED DUE TO ELEVATED POST LABS. PLAN NEEDS TO SEE A POSITIVE CLINICAL RESPONSE TO THERAPY

## 2024-05-20 ENCOUNTER — Other Ambulatory Visit: Payer: Self-pay | Admitting: Cardiology

## 2024-05-20 ENCOUNTER — Other Ambulatory Visit: Payer: Self-pay | Admitting: Internal Medicine

## 2024-05-22 ENCOUNTER — Telehealth: Payer: Self-pay

## 2024-05-22 NOTE — Telephone Encounter (Signed)
Contact patient to schedule follow up appointment

## 2024-05-23 ENCOUNTER — Telehealth: Payer: Self-pay | Admitting: Pharmacy Technician

## 2024-05-23 NOTE — Telephone Encounter (Signed)
   Pharmacy Patient Advocate Encounter   Received notification from Onbase that prior authorization for REPATHA  is required/requested.   Insurance verification completed.   The patient is insured through [facility] .   Per test claim: PA required; PA submitted to above mentioned insurance via Latent Key/confirmation #/EOC [id number] Status is pending

## 2024-05-24 NOTE — Telephone Encounter (Signed)
 Pharmacy Patient Advocate Encounter  Received notification from [facility] that Prior Authorization for repatha  has been APPROVED from 05/23/24 to 05/23/25   PA #/Case ID/Reference #: [id number]

## 2024-06-05 ENCOUNTER — Encounter: Payer: Self-pay | Admitting: Cardiology

## 2024-06-05 ENCOUNTER — Ambulatory Visit: Attending: Cardiology | Admitting: Cardiology

## 2024-06-05 VITALS — BP 118/76 | HR 74 | Ht 68.5 in | Wt 281.6 lb

## 2024-06-05 DIAGNOSIS — I10 Essential (primary) hypertension: Secondary | ICD-10-CM | POA: Insufficient documentation

## 2024-06-05 DIAGNOSIS — Z951 Presence of aortocoronary bypass graft: Secondary | ICD-10-CM | POA: Diagnosis present

## 2024-06-05 DIAGNOSIS — E785 Hyperlipidemia, unspecified: Secondary | ICD-10-CM | POA: Diagnosis not present

## 2024-06-05 DIAGNOSIS — G4733 Obstructive sleep apnea (adult) (pediatric): Secondary | ICD-10-CM | POA: Insufficient documentation

## 2024-06-05 DIAGNOSIS — I25118 Atherosclerotic heart disease of native coronary artery with other forms of angina pectoris: Secondary | ICD-10-CM | POA: Insufficient documentation

## 2024-06-05 MED ORDER — ASPIRIN 81 MG PO TBEC: 81.0000 mg | DELAYED_RELEASE_TABLET | Freq: Every day | ORAL | Status: AC

## 2024-06-05 NOTE — Progress Notes (Signed)
 Cardiology Office Note:    Date:  06/05/2024   ID:  [name], DOB 04-Aug-1971, MRN [id number]  PCP:  Meghan Lamarr RAMAN, MD  Cardiologist:  Meghan LITTIE Nanas, MD  Electrophysiologist:  None   Referring MD: [name], *   No chief complaint on file.   History of Present Illness:    [name] is a 53 y.o. female with a hx of coronary artery disease status post CABG x4 on 10/24/2019 (LIMA-LAD, radial-diagonal, SVG-diagonal, SVG-PDA), type 2 diabetes, hypertension, hyperlipidemia who presents for a follow-up evaluation.  Patient was admitted to [facility] on 06/13/19 with chest pain.  EKG was unremarkable and high-sensitivity troponins were negative.  However due to her extensive risk factors including uncontrolled diabetes, coronary CTA was ordered.  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  She was discharged on medical management: atorvastatin  80 mg, Imdur  30 mg.  TTE showed EF 60-65%.  During subsequent office visits, her antianginal regimen was titrated as she continued to report chest pain.  At last clinic visit on 10/02/2019, she was on metoprolol  25 mg twice daily and Imdur  60 mg daily, but further titration was limited by low blood pressures.  Given inability to further titrate up antianginals and having persistent chest pain, cardiac catheterization was ordered.  Underwent cath on 10/06/2019, which showed three-vessel CAD: 80% mid LAD, 85% slitlike ostial D1, 80% ostial D2, 80% small PDA, anomalous circumflex off the RCA cusp with moderate diffuse disease.  Recommendation was for aggressive medical management and if symptoms persist consider surgical  consultation.  She continued to have chest pain despite medical management, so was referred to cardiac surgery.  She underwent CABG x4 on 10/24/2019 (LIMA-LAD, radial-diagonal, SVG-diagonal, SVG-PDA) with Dr. Fleeta Ochoa.  Echocardiogram on 09/20/2018 showed biventricular function, no significant valvular disease.  Stress CMR on 11/05/2020 showed no evidence of ischemia, no LGE, LVEF 61%, RVEF 60%.   Reported worsening chest pain and underwent stress PET 08/2022, which showed normal perfusion.  Since last clinic visit, she reports she is doing okay.  States that she was depressed and stopped all her medications for about 9 months.  Restarted her medications in July.  States that she is doing much better now.  A1c was up to 15.5%, but states that she saw her endocrinologist today and had improved to 12%.  She also was off her Repatha  and Zetia , but now back on medications.  She did not restart her baby aspirin .  Reports no significant chest pain, has not had to take nitroglycerin .  She denies any dyspnea, lightheadedness, syncope, or palpitations.  Reports some swelling in her leg.   Wt Readings from Last 3 Encounters:  06/05/24 281 lb 9.6 oz (127.7 kg)  07/05/23 291 lb 3.2 oz (132.1 kg)  06/30/23 286 lb (129.7 kg)     Past Medical History:  Diagnosis Date   Angina pectoris 10/06/2019   Anxiety    Arthritis    fingers   Cervical dysplasia    Coronary artery disease    Depression    Diabetes mellitus    Type II   Dyspnea 11/22/2013   GERD (gastroesophageal reflux disease)    High cholesterol    History  of kidney stones    passed   Hypertension    no meds now   Kidney stones    Moderate episode of recurrent major depressive disorder (HCC) 12/28/2019   Neuropathy    feet and legs   Nuclear sclerotic cataract of both eyes 09/2019   Dr. Abigail   PONV (postoperative nausea and vomiting)    one time   Sleep apnea    lost 70lbs no cpap x40yrs now   Stage 3a chronic kidney disease (HCC)  04/29/2020    Past Surgical History:  Procedure Laterality Date   CARDIAC CATHETERIZATION     4-52yrs ago   CERVICAL CONIZATION W/BX N/A 05/04/2018   Procedure: CONIZATION CERVIX WITH BIOPSY - COLD KNIFE;  Surgeon: Lorence Ozell CROME, MD;  Location: [facility];  Service: Gynecology;  Laterality: N/A;   CHOLECYSTECTOMY     CORONARY ARTERY BYPASS GRAFT N/A 10/24/2019   Procedure: CORONARY ARTERY BYPASS GRAFTING (CABG) times four, using left radial artery harvested endoscopically and right greater saphenous vein harvested endoscopically.;  Surgeon: Fleeta Hanford Coy, MD;  Location: [facility] OR;  Service: Open Heart Surgery;  Laterality: N/A;   ELBOW FRACTURE SURGERY Left    ELBOW SURGERY Right    ENDOMETRIAL ABLATION     6 years ago   INCISION AND DRAINAGE     for boil- buttocks   INCISION AND DRAINAGE Left    Leg, Nephrotoxin fasciotimy   IRRIGATION AND DEBRIDEMENT ABSCESS Right 03/17/2017   Procedure: IRRIGATION AND DEBRIDEMENT RIGHT THIGH ABSCESS;  Surgeon: Sheldon Standing, MD;  Location: [facility] ORS;  Service: General;  Laterality: Right;   LAPAROSCOPIC APPENDECTOMY  10/04/2011   Procedure: APPENDECTOMY LAPAROSCOPIC;  Surgeon: Redell Alm Faith, DO;  Location: [facility];  Service: General;  Laterality: N/A;   LEFT HEART CATH AND CORONARY ANGIOGRAPHY N/A 10/06/2019   Procedure: LEFT HEART CATH AND CORONARY ANGIOGRAPHY;  Surgeon: [place], Peter M, MD;  Location: [facility];  Service: Cardiovascular;  Laterality: N/A;   RADIAL ARTERY HARVEST Left 10/24/2019   Procedure: Radial Artery Harvest;  Surgeon: Fleeta Hanford Coy, MD;  Location: [facility] OR;  Service: Open Heart Surgery;  Laterality: Left;   TEE WITHOUT CARDIOVERSION N/A 10/24/2019   Procedure: TRANSESOPHAGEAL ECHOCARDIOGRAM (TEE);  Surgeon: Fleeta Hanford, Coy, MD;  Location: [facility] OR;  Service: Open Heart Surgery;  Laterality: N/A;   TENDON RECONSTRUCTION Right 07/02/2021   Procedure: Right wrist 6 dorsal compartment tenosynovectomy and  tendon repair Right elbow lateral epicondylar debridement and tendon repair;  Surgeon: Shari Easter, MD;  Location: [facility];  Service: Orthopedics;  Laterality: Right;   WRIST FRACTURE SURGERY Left    WRIST SURGERY Right     Current Medications: Current Meds  Medication Sig   buPROPion (WELLBUTRIN XL) 150 MG 24 hr tablet Take 150 mg by mouth daily. Needs to pick up   Continuous Glucose Sensor (FREESTYLE LIBRE 3 SENSOR) MISC by Does not apply route.   nitroGLYCERIN  (NITROSTAT ) 0.4 MG SL tablet Place 1 tablet (0.4 mg total) under the tongue every 5 (five) minutes as needed for chest pain.   [DISCONTINUED] DIFLUCAN  150 MG tablet 1 tablet Orally once and again in 72 hrs if symptoms persist (Patient taking differently: as needed.)     Allergies:   Sulfa antibiotics and Codeine   Social History   Socioeconomic History   Marital status: Widowed    Spouse name: Not on file   Number of children: 1   Years of education: Not on file  Highest education level: Not on file  Occupational History   Occupation: none  Tobacco Use   Smoking status: Former    Current packs/day: 0.00    Average packs/day: 2.0 packs/day for 24.0 years (48.0 ttl pk-yrs)    Types: Cigarettes    Start date: 04/22/1993    Quit date: 04/22/2017    Years since quitting: 7.1    Passive exposure: Past   Smokeless tobacco: Never   Tobacco comments:    vape  Vaping Use   Vaping status: Every Day  Substance and Sexual Activity   Alcohol use: Yes    Comment: rare   Drug use: No   Sexual activity: Yes    Birth control/protection: None  Other Topics Concern   Not on file  Social History Narrative   Not on file   Social Drivers of Health   Financial Resource Strain: Not on file  Food Insecurity: Not on file  Transportation Needs: Not on file  Physical Activity: Not on file  Stress: Not on file  Social Connections: Not on file     Family History: The patient's family history includes Aneurysm in her mother;  Breast cancer in her paternal grandmother; Congestive Heart Failure in her sister; Heart attack in her father; Heart disease in her sister; Osteoarthritis in her sister and sister; Pancreatic cancer in her mother; Stroke in her father.  ROS:   Please see the history of present illness.     All other systems reviewed and are negative.  EKGs/Labs/Other Studies Reviewed:    The following studies were reviewed today:   EKG:   12/03/21: NSR, rate 71, RBBB 09/01/22: Normal sinus rhythm, rate 66, right bundle branch block 06/05/2024: Normal sinus rhythm, right bundle branch block, left posterior fascicular block, heart rate 74   TTE 06/14/19:  1. Left ventricular ejection fraction, by visual estimation, is 60 to 65%. The left ventricle has normal function. Normal left ventricular size. There is no left ventricular hypertrophy.  2. Left ventricular diastolic Doppler parameters are consistent with impaired relaxation pattern of LV diastolic filling.  3. Global right ventricle has normal systolic function.The right ventricular size is normal. No increase in right ventricular wall thickness.  4. Left atrial size was normal.  5. Right atrial size was normal.  6. The mitral valve is normal in structure. No evidence of mitral valve regurgitation. No evidence of mitral stenosis.  7. The tricuspid valve is normal in structure. Tricuspid valve regurgitation was not visualized by color flow Doppler.  8. The aortic valve is normal in structure. Aortic valve regurgitation was not visualized by color flow Doppler. Structurally normal aortic valve, with no evidence of sclerosis or stenosis.  9. The pulmonic valve was normal in structure. Pulmonic valve regurgitation is not visualized by color flow Doppler. 10. The inferior vena cava is normal in size with greater than 50% respiratory variability, suggesting right atrial pressure of 3 mmHg.  Coronary CT 06/15/19: 1. Coronary calcium  score of 81. This was 77  percentile for age and sex matched control. 2. 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 Noncalcified plaque in mid LCX appears to cause severe stenosis (70-99%) but vessel is small (<36mm) 3. Calcified plaque in the proximal LAD causes 25-49% stenosis  CTFFR 06/15/19: 1. CT-FFR across lesion in proximal LAD is 0.92, suggesting lesion is not functionally significant 2. CT-FFR across lesion in ostial D2 is 0.62, suggesting functional significance 3. CT-FFR across lesion in anomalous  circumflex is 0.65, suggesting functional significance  Cath 10/06/19: Prox Cx lesion is 60% stenosed. Mid Cx lesion is 70% stenosed. RPDA lesion is 80% stenosed. Mid LM to Ost LAD lesion is 35% stenosed. 1st Diag lesion is 85% stenosed. 2nd Diag lesion is 80% stenosed. Mid LAD lesion is 80% stenosed. LV end diastolic pressure is normal.   1. Three vessel CAD.    - 80% mid LAD after the second diagonal    - 85% slit like stenosis at the origin of the first diagonal- best seen in the LAO caudal shot.    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 Normal LVEDP   Plan: reviewed with interventional colleagues. Her anatomy doesn't offer good PCI options except the mid LAD. Treating the ostial disease in the diagonal branches would likely require stenting into the LAD. The LCx and PDA are too small for PCI. I would aggressively treat medically. If symptoms persist I would consider surgical consultation.      Recent Labs: No results found for requested labs within last 365 days.  Recent Lipid Panel    Component Value Date/Time   CHOL 147 07/30/2021 1213   CHOL 152 08/27/2020 1227   TRIG 128.0 07/30/2021 1213   HDL 68.70 07/30/2021 1213   HDL 76 08/27/2020 1227   CHOLHDL 2 07/30/2021 1213   VLDL 25.6 07/30/2021 1213   LDLCALC 53 07/30/2021 1213   LDLCALC 60 08/27/2020 1227     Physical Exam:    VS:  BP 118/76   Pulse 74   Ht 5' 8.5 (1.74 m)   Wt 281 lb 9.6 oz (127.7 kg)   LMP  (LMP Unknown)   SpO2 96%   BMI 42.19 kg/m     Wt Readings from Last 3 Encounters:  06/05/24 281 lb 9.6 oz (127.7 kg)  07/05/23 291 lb 3.2 oz (132.1 kg)  06/30/23 286 lb (129.7 kg)    GEN:  in no acute distress HEENT: Normal NECK: No JVD LYMPHATICS: No lymphadenopathy CARDIAC: RRR, no murmurs, rubs, gallops RESPIRATORY:  Clear to auscultation without rales, wheezing or rhonchi  ABDOMEN: Soft, non-tender, non-distended MUSCULOSKELETAL:  No edema; No deformity  SKIN: Warm and dry.   NEUROLOGIC:  Alert and oriented x 3 PSYCHIATRIC:  Normal affect   ASSESSMENT:    1. Coronary artery disease of native artery of native heart with stable angina pectoris   2. S/P CABG (coronary artery bypass graft)   3. Essential hypertension   4. Dyslipidemia   5. OSA (obstructive sleep apnea)      PLAN:    Coronary artery disease: three-vessel CAD on cath 10/06/19: 80% mid LAD, 85% slitlike ostial D1, 80% ostial D2, 80% small PDA, anomalous circumflex off the RCA cusp with moderate diffuse disease.   Status post CABG x4 on 10/24/2019 (LIMA-LAD, radial-diagonal, SVG-diagonal, SVG-PDA).  Normal LV systolic function on echo 06/14/2019.  Stress CMR on 11/05/2020 showed no evidence of ischemia, no LGE, LVEF 61%, RVEF 60%.  Reported worsening chest pain and underwent stress PET 08/2022, which showed normal perfusion. -Reports she stopped taking aspirin , recommend restarting aspirin  81 mg daily - Continue Zetia  10 mg daily and Repatha  -Continue Toprol -XL 12.5 mg daily -As needed sublingual nitroglycerin   Lower extremity edema: No structural heart disease on echocardiogram.  No DVT on duplex.  BNP was normal.  Has improved with PO lasix  20 mg daily, will  continue.  Reports she was started today on losartan  by her endocrinologist today, will check BMET in 1 week  Hyperlipidemia: LDL 204 on 06/14/19.   Started on atorvastatin  80 mg daily and Zetia  10 mg daily.  LDL 53 on 07/30/2021.  Unfortunately she stopped taking both atorvastatin  and Zetia  and LDL was back up to 179 on 3/87/7976.  She is now on Repatha  and Zetia , LDL 50 on 03/31/2024  Tobacco use: smoked 2ppd x 30 years, has quit smoking.  Started vaping but reports she has quit.  Type 2 diabetes: A1c 7.7% on 3/30//2022, down from 15.1 on 10/20/2019.  On insulin  pump, Metformin , Farxiga , Ozempic .  Follows with endocrinology.  A1c 10.3% on 04/17/22.  A1c was back up to 15.5% in July but reports she is now back on her medications and has improved some, was 12% at endocrinology appointment today for patient  OSA: positive sleep study 07/28/21, started CPAP.  Reports she stopped using, encouraged compliance.  Will refer to sleep medicine  Morbid obesity: Body mass index is 42.19 kg/m.  On ozempic .    RTC in 4 months    Medication Adjustments/Labs and Tests Ordered: Current medicines are reviewed at length with the patient today.  Concerns regarding medicines are outlined above.  Orders Placed This Encounter  Procedures   Basic metabolic panel with GFR   Ambulatory referral to Pulmonology   EKG 12-Lead   ECHOCARDIOGRAM COMPLETE    Meds ordered this encounter  Medications   aspirin  EC 81 MG tablet    Sig: Take 1 tablet (81 mg total) by mouth daily.     Patient Instructions  Medication Instructions:  Your physician has recommended you make the following change in your medication:   -Restart aspirin  EC 81mg  once daily.  *If you need a refill on your cardiac medications before your next appointment, please call your pharmacy*   Lab Work: Your physician recommends that you return for lab work in: 1 week for BMET  If you have labs (blood work) drawn today and your tests are completely normal, you will receive your results only by: MyChart Message (if you have MyChart) OR A paper copy in the mail If you have any lab test that  is abnormal or we need to change your treatment, we will call you to review the results.   Testing/Procedures: Your physician has requested that you have an echocardiogram. Echocardiography is a painless test that uses sound waves to create images of your heart. It provides your doctor with information about the size and shape of your heart and how well your heart's chambers and valves are working. This procedure takes approximately one hour. There are no restrictions for this procedure. Please do NOT wear cologne, perfume, aftershave, or lotions (deodorant is allowed). Please arrive 15 minutes prior to your appointment time.  Please note: We ask at that you not bring children with you during ultrasound (echo/ vascular) testing. Due to room size and safety concerns, children are not allowed in the ultrasound rooms during exams. Our front office staff cannot provide observation of children in our lobby area while testing is being conducted. An adult accompanying a patient to their appointment will only be allowed in the ultrasound room at the discretion of the ultrasound technician under special circumstances. We apologize for any inconvenience.   Follow-Up: At [facility], you and your health needs are our priority.  As part of our continuing mission to provide you with exceptional heart care, our providers are  all part of one team.  This team includes your primary Cardiologist (physician) and Advanced Practice Providers or APPs (Physician Assistants and Nurse Practitioners) who all work together to provide you with the care you need, when you need it.  Your next appointment:   4 month(s)  Provider:   Lonni LITTIE Nanas, MD    We recommend signing up for the patient portal called MyChart.  Sign up information is provided on this After Visit Summary.  MyChart is used to connect with patients for Virtual Visits (Telemedicine).  Patients are able to view lab/test results, encounter  notes, upcoming appointments, etc.  Non-urgent messages can be sent to your provider as well.   To learn more about what you can do with MyChart, go to [email or web address].       Signed, Meghan LITTIE Nanas, MD  06/05/2024 3:29 PM    [facility]

## 2024-06-05 NOTE — Patient Instructions (Addendum)
 Medication Instructions:  Your physician has recommended you make the following change in your medication:   -Restart aspirin  EC 81mg  once daily.  *If you need a refill on your cardiac medications before your next appointment, please call your pharmacy*   Lab Work: Your physician recommends that you return for lab work in: 1 week for BMET  If you have labs (blood work) drawn today and your tests are completely normal, you will receive your results only by: MyChart Message (if you have MyChart) OR A paper copy in the mail If you have any lab test that is abnormal or we need to change your treatment, we will call you to review the results.   Testing/Procedures: Your physician has requested that you have an echocardiogram. Echocardiography is a painless test that uses sound waves to create images of your heart. It provides your doctor with information about the size and shape of your heart and how well your heart's chambers and valves are working. This procedure takes approximately one hour. There are no restrictions for this procedure. Please do NOT wear cologne, perfume, aftershave, or lotions (deodorant is allowed). Please arrive 15 minutes prior to your appointment time.  Please note: We ask at that you not bring children with you during ultrasound (echo/ vascular) testing. Due to room size and safety concerns, children are not allowed in the ultrasound rooms during exams. Our front office staff cannot provide observation of children in our lobby area while testing is being conducted. An adult accompanying a patient to their appointment will only be allowed in the ultrasound room at the discretion of the ultrasound technician under special circumstances. We apologize for any inconvenience.   Follow-Up: At [facility], you and your health needs are our priority.  As part of our continuing mission to provide you with exceptional heart care, our providers are all part of one team.   This team includes your primary Cardiologist (physician) and Advanced Practice Providers or APPs (Physician Assistants and Nurse Practitioners) who all work together to provide you with the care you need, when you need it.  Your next appointment:   4 month(s)  Provider:   Lonni LITTIE Nanas, MD    We recommend signing up for the patient portal called MyChart.  Sign up information is provided on this After Visit Summary.  MyChart is used to connect with patients for Virtual Visits (Telemedicine).  Patients are able to view lab/test results, encounter notes, upcoming appointments, etc.  Non-urgent messages can be sent to your provider as well.   To learn more about what you can do with MyChart, go to [email or web address].

## 2024-06-07 ENCOUNTER — Ambulatory Visit (HOSPITAL_COMMUNITY)
Admission: RE | Admit: 2024-06-07 | Discharge: 2024-06-07 | Disposition: A | Discharge status: Home / Self Care | Source: Ambulatory Visit | Attending: Cardiology | Admitting: Cardiology

## 2024-06-07 DIAGNOSIS — I25118 Atherosclerotic heart disease of native coronary artery with other forms of angina pectoris: Secondary | ICD-10-CM | POA: Insufficient documentation

## 2024-06-07 DIAGNOSIS — G4733 Obstructive sleep apnea (adult) (pediatric): Secondary | ICD-10-CM | POA: Diagnosis present

## 2024-06-07 DIAGNOSIS — I10 Essential (primary) hypertension: Secondary | ICD-10-CM | POA: Insufficient documentation

## 2024-06-07 DIAGNOSIS — E785 Hyperlipidemia, unspecified: Secondary | ICD-10-CM | POA: Insufficient documentation

## 2024-06-07 DIAGNOSIS — Z951 Presence of aortocoronary bypass graft: Secondary | ICD-10-CM | POA: Insufficient documentation

## 2024-06-07 MED ORDER — PERFLUTREN LIPID MICROSPHERE
1.0000 mL | INTRAVENOUS | Status: AC | PRN
  Administered 2024-06-07: 3 mL via INTRAVENOUS

## 2024-06-08 LAB — ECHOCARDIOGRAM COMPLETE
Area-P 1/2: 3.46 cm2
S' Lateral: 2.6 cm

## 2024-06-09 ENCOUNTER — Ambulatory Visit: Payer: Self-pay | Admitting: Cardiology

## 2024-06-11 ENCOUNTER — Other Ambulatory Visit: Payer: Self-pay | Admitting: Cardiology

## 2024-06-26 ENCOUNTER — Other Ambulatory Visit: Payer: Self-pay | Admitting: Cardiology

## 2024-07-04 ENCOUNTER — Ambulatory Visit: Payer: Medicaid Other | Admitting: Rheumatology

## 2024-07-17 ENCOUNTER — Telehealth: Payer: Self-pay

## 2024-07-17 NOTE — Telephone Encounter (Signed)
 ATC LVMTCB. Attempted to reach pt in regards to her appt with Tammy on 07/28/24. If pt is using a CPAP, she will need to bring it to her appt.

## 2024-07-18 ENCOUNTER — Ambulatory Visit: Admitting: Adult Health

## 2024-08-22 ENCOUNTER — Other Ambulatory Visit: Payer: Self-pay | Admitting: Cardiology

## 2024-08-29 ENCOUNTER — Encounter: Payer: Self-pay | Admitting: Medical Genetics

## 2024-09-08 ENCOUNTER — Telehealth: Payer: Self-pay

## 2024-09-08 NOTE — Telephone Encounter (Signed)
 Sent MyChart message to bring CPAP to appt.

## 2024-09-11 ENCOUNTER — Ambulatory Visit: Admitting: Adult Health

## 2024-10-02 ENCOUNTER — Ambulatory Visit: Admitting: Cardiology

## 2024-10-04 ENCOUNTER — Encounter: Payer: Self-pay | Admitting: Cardiology
